# Patient Record
Sex: Male | Born: 1955 | Race: Black or African American | Hispanic: No | Marital: Married | Smoking: Current some day smoker
Health system: Southern US, Community
[De-identification: ages and names within clinical notes are randomized; demographics above are authoritative.]

## PROBLEM LIST (undated history)

## (undated) DIAGNOSIS — IMO0002 Reserved for concepts with insufficient information to code with codable children: Secondary | ICD-10-CM

## (undated) DIAGNOSIS — N189 Chronic kidney disease, unspecified: Secondary | ICD-10-CM

## (undated) DIAGNOSIS — M79673 Pain in unspecified foot: Secondary | ICD-10-CM

## (undated) DIAGNOSIS — I639 Cerebral infarction, unspecified: Secondary | ICD-10-CM

## (undated) DIAGNOSIS — M1990 Unspecified osteoarthritis, unspecified site: Secondary | ICD-10-CM

## (undated) DIAGNOSIS — I739 Peripheral vascular disease, unspecified: Secondary | ICD-10-CM

## (undated) DIAGNOSIS — I10 Essential (primary) hypertension: Secondary | ICD-10-CM

## (undated) DIAGNOSIS — J449 Chronic obstructive pulmonary disease, unspecified: Secondary | ICD-10-CM

## (undated) DIAGNOSIS — G709 Myoneural disorder, unspecified: Secondary | ICD-10-CM

## (undated) DIAGNOSIS — G8929 Other chronic pain: Secondary | ICD-10-CM

## (undated) DIAGNOSIS — E785 Hyperlipidemia, unspecified: Secondary | ICD-10-CM

## (undated) DIAGNOSIS — S92909A Unspecified fracture of unspecified foot, initial encounter for closed fracture: Secondary | ICD-10-CM

## (undated) DIAGNOSIS — M549 Dorsalgia, unspecified: Secondary | ICD-10-CM

## (undated) DIAGNOSIS — J45909 Unspecified asthma, uncomplicated: Secondary | ICD-10-CM

## (undated) HISTORY — DX: Chronic kidney disease, unspecified: N18.9

## (undated) HISTORY — PX: CAROTID ENDARTERECTOMY: SUR193

## (undated) HISTORY — PX: ENUCLEATION: SHX628

## (undated) HISTORY — PX: EYE SURGERY: SHX253

## (undated) HISTORY — DX: Chronic obstructive pulmonary disease, unspecified: J44.9

## (undated) HISTORY — DX: Other chronic pain: G89.29

## (undated) HISTORY — PX: CARDIAC CATHETERIZATION: SHX172

## (undated) HISTORY — DX: Hyperlipidemia, unspecified: E78.5

## (undated) HISTORY — PX: BACK SURGERY: SHX140

## (undated) HISTORY — DX: Pain in unspecified foot: M79.673

## (undated) HISTORY — DX: Cerebral infarction, unspecified: I63.9

## (undated) HISTORY — PX: BELOW KNEE LEG AMPUTATION: SUR23

## (undated) HISTORY — PX: NECK SURGERY: SHX720

## (undated) HISTORY — DX: Essential (primary) hypertension: I10

## (undated) HISTORY — DX: Dorsalgia, unspecified: M54.9

---

## 1983-06-10 DIAGNOSIS — IMO0002 Reserved for concepts with insufficient information to code with codable children: Secondary | ICD-10-CM

## 1983-06-10 HISTORY — DX: Reserved for concepts with insufficient information to code with codable children: IMO0002

## 1999-02-27 ENCOUNTER — Encounter: Admission: RE | Admit: 1999-02-27 | Discharge: 1999-05-28 | Payer: Self-pay | Admitting: Family Medicine

## 2000-02-11 ENCOUNTER — Encounter: Admission: RE | Admit: 2000-02-11 | Discharge: 2000-02-11 | Payer: Self-pay | Admitting: Family Medicine

## 2000-03-30 ENCOUNTER — Encounter: Admission: RE | Admit: 2000-03-30 | Discharge: 2000-03-30 | Payer: Self-pay | Admitting: Family Medicine

## 2000-04-17 ENCOUNTER — Encounter: Admission: RE | Admit: 2000-04-17 | Discharge: 2000-04-17 | Payer: Self-pay | Admitting: Family Medicine

## 2000-08-05 ENCOUNTER — Encounter: Admission: RE | Admit: 2000-08-05 | Discharge: 2000-08-05 | Payer: Self-pay | Admitting: Family Medicine

## 2000-11-30 ENCOUNTER — Encounter: Admission: RE | Admit: 2000-11-30 | Discharge: 2000-11-30 | Payer: Self-pay | Admitting: Family Medicine

## 2001-08-27 ENCOUNTER — Encounter: Admission: RE | Admit: 2001-08-27 | Discharge: 2001-08-27 | Payer: Self-pay | Admitting: Family Medicine

## 2001-09-03 ENCOUNTER — Encounter: Admission: RE | Admit: 2001-09-03 | Discharge: 2001-09-03 | Payer: Self-pay | Admitting: Family Medicine

## 2002-04-06 ENCOUNTER — Encounter: Admission: RE | Admit: 2002-04-06 | Discharge: 2002-04-06 | Payer: Self-pay | Admitting: Family Medicine

## 2002-11-08 ENCOUNTER — Emergency Department (HOSPITAL_COMMUNITY): Admission: EM | Admit: 2002-11-08 | Discharge: 2002-11-08 | Payer: Self-pay | Admitting: Emergency Medicine

## 2002-11-10 ENCOUNTER — Encounter: Admission: RE | Admit: 2002-11-10 | Discharge: 2002-11-10 | Payer: Self-pay | Admitting: Sports Medicine

## 2002-11-11 ENCOUNTER — Encounter: Admission: RE | Admit: 2002-11-11 | Discharge: 2002-11-11 | Payer: Self-pay | Admitting: Sports Medicine

## 2002-11-14 ENCOUNTER — Encounter: Admission: RE | Admit: 2002-11-14 | Discharge: 2002-11-14 | Payer: Self-pay | Admitting: Sports Medicine

## 2002-11-17 ENCOUNTER — Encounter: Admission: RE | Admit: 2002-11-17 | Discharge: 2002-11-17 | Payer: Self-pay | Admitting: Family Medicine

## 2002-11-21 ENCOUNTER — Encounter: Admission: RE | Admit: 2002-11-21 | Discharge: 2002-11-21 | Payer: Self-pay | Admitting: Family Medicine

## 2002-12-09 ENCOUNTER — Encounter: Admission: RE | Admit: 2002-12-09 | Discharge: 2002-12-09 | Payer: Self-pay | Admitting: Family Medicine

## 2003-06-26 ENCOUNTER — Encounter: Admission: RE | Admit: 2003-06-26 | Discharge: 2003-06-26 | Payer: Self-pay | Admitting: Family Medicine

## 2004-03-25 ENCOUNTER — Ambulatory Visit: Payer: Self-pay | Admitting: Family Medicine

## 2004-04-17 ENCOUNTER — Ambulatory Visit: Payer: Self-pay | Admitting: Family Medicine

## 2004-04-23 ENCOUNTER — Ambulatory Visit: Payer: Self-pay | Admitting: Family Medicine

## 2004-05-19 ENCOUNTER — Emergency Department (HOSPITAL_COMMUNITY): Admission: EM | Admit: 2004-05-19 | Discharge: 2004-05-19 | Payer: Self-pay | Admitting: Emergency Medicine

## 2004-05-20 ENCOUNTER — Ambulatory Visit: Payer: Self-pay | Admitting: Sports Medicine

## 2004-05-22 ENCOUNTER — Encounter: Admission: RE | Admit: 2004-05-22 | Discharge: 2004-08-20 | Payer: Self-pay | Admitting: Family Medicine

## 2004-05-27 ENCOUNTER — Ambulatory Visit: Payer: Self-pay | Admitting: Sports Medicine

## 2004-06-17 ENCOUNTER — Ambulatory Visit: Payer: Self-pay | Admitting: Family Medicine

## 2004-07-03 ENCOUNTER — Ambulatory Visit: Payer: Self-pay | Admitting: Family Medicine

## 2004-07-03 ENCOUNTER — Encounter: Admission: RE | Admit: 2004-07-03 | Discharge: 2004-07-03 | Payer: Self-pay | Admitting: Sports Medicine

## 2004-07-24 ENCOUNTER — Ambulatory Visit: Payer: Self-pay | Admitting: Family Medicine

## 2004-07-25 ENCOUNTER — Ambulatory Visit (HOSPITAL_COMMUNITY): Admission: RE | Admit: 2004-07-25 | Discharge: 2004-07-25 | Payer: Self-pay | Admitting: Neurology

## 2004-07-29 ENCOUNTER — Ambulatory Visit: Payer: Self-pay | Admitting: Family Medicine

## 2004-08-09 ENCOUNTER — Ambulatory Visit (HOSPITAL_COMMUNITY): Admission: RE | Admit: 2004-08-09 | Discharge: 2004-08-09 | Payer: Self-pay | Admitting: Neurology

## 2004-08-23 ENCOUNTER — Ambulatory Visit (HOSPITAL_COMMUNITY): Admission: RE | Admit: 2004-08-23 | Discharge: 2004-08-24 | Payer: Self-pay | Admitting: Neurosurgery

## 2004-08-29 ENCOUNTER — Ambulatory Visit: Payer: Self-pay | Admitting: Family Medicine

## 2004-09-17 ENCOUNTER — Ambulatory Visit (HOSPITAL_COMMUNITY): Admission: RE | Admit: 2004-09-17 | Discharge: 2004-09-17 | Payer: Self-pay | Admitting: Neurosurgery

## 2004-10-28 ENCOUNTER — Ambulatory Visit (HOSPITAL_COMMUNITY): Admission: RE | Admit: 2004-10-28 | Discharge: 2004-10-28 | Payer: Self-pay | Admitting: Neurosurgery

## 2004-10-28 ENCOUNTER — Ambulatory Visit: Payer: Self-pay | Admitting: Family Medicine

## 2004-11-11 ENCOUNTER — Ambulatory Visit: Payer: Self-pay | Admitting: Family Medicine

## 2004-11-14 ENCOUNTER — Ambulatory Visit (HOSPITAL_COMMUNITY): Admission: RE | Admit: 2004-11-14 | Discharge: 2004-11-14 | Payer: Self-pay | Admitting: Neurosurgery

## 2004-11-26 ENCOUNTER — Encounter: Admission: RE | Admit: 2004-11-26 | Discharge: 2005-02-24 | Payer: Self-pay | Admitting: Neurology

## 2004-12-16 ENCOUNTER — Emergency Department (HOSPITAL_COMMUNITY): Admission: EM | Admit: 2004-12-16 | Discharge: 2004-12-16 | Payer: Self-pay | Admitting: Family Medicine

## 2004-12-16 ENCOUNTER — Ambulatory Visit (HOSPITAL_COMMUNITY): Admission: RE | Admit: 2004-12-16 | Discharge: 2004-12-16 | Payer: Self-pay | Admitting: Family Medicine

## 2005-01-03 ENCOUNTER — Ambulatory Visit: Payer: Self-pay | Admitting: Family Medicine

## 2005-01-22 ENCOUNTER — Ambulatory Visit: Payer: Self-pay | Admitting: Family Medicine

## 2005-02-14 ENCOUNTER — Ambulatory Visit (HOSPITAL_COMMUNITY): Admission: RE | Admit: 2005-02-14 | Discharge: 2005-02-14 | Payer: Self-pay | Admitting: Neurosurgery

## 2005-02-18 ENCOUNTER — Ambulatory Visit: Payer: Self-pay | Admitting: Family Medicine

## 2005-03-04 ENCOUNTER — Ambulatory Visit (HOSPITAL_COMMUNITY): Admission: RE | Admit: 2005-03-04 | Discharge: 2005-03-04 | Payer: Self-pay | Admitting: Neurology

## 2005-04-21 ENCOUNTER — Ambulatory Visit: Payer: Self-pay | Admitting: Family Medicine

## 2005-04-25 ENCOUNTER — Ambulatory Visit (HOSPITAL_COMMUNITY): Admission: RE | Admit: 2005-04-25 | Discharge: 2005-04-25 | Payer: Self-pay | Admitting: Cardiovascular Disease

## 2005-05-16 ENCOUNTER — Ambulatory Visit (HOSPITAL_COMMUNITY): Admission: RE | Admit: 2005-05-16 | Discharge: 2005-05-16 | Payer: Self-pay | Admitting: Cardiovascular Disease

## 2005-06-16 ENCOUNTER — Ambulatory Visit: Payer: Self-pay | Admitting: Sports Medicine

## 2005-06-28 ENCOUNTER — Ambulatory Visit (HOSPITAL_COMMUNITY): Admission: RE | Admit: 2005-06-28 | Discharge: 2005-06-28 | Payer: Self-pay | Admitting: Neurology

## 2005-07-22 ENCOUNTER — Encounter: Admission: RE | Admit: 2005-07-22 | Discharge: 2005-10-20 | Payer: Self-pay | Admitting: Neurology

## 2005-07-30 ENCOUNTER — Ambulatory Visit: Payer: Self-pay | Admitting: Sports Medicine

## 2005-10-01 ENCOUNTER — Ambulatory Visit (HOSPITAL_COMMUNITY): Admission: RE | Admit: 2005-10-01 | Discharge: 2005-10-01 | Payer: Self-pay | Admitting: Neurology

## 2005-10-16 ENCOUNTER — Inpatient Hospital Stay (HOSPITAL_COMMUNITY): Admission: EM | Admit: 2005-10-16 | Discharge: 2005-10-22 | Payer: Self-pay | Admitting: Emergency Medicine

## 2005-10-16 ENCOUNTER — Ambulatory Visit: Payer: Self-pay | Admitting: Cardiovascular Disease

## 2005-10-16 ENCOUNTER — Ambulatory Visit: Payer: Self-pay | Admitting: Family Medicine

## 2005-10-17 ENCOUNTER — Encounter: Payer: Self-pay | Admitting: Cardiovascular Disease

## 2005-10-27 ENCOUNTER — Ambulatory Visit: Payer: Self-pay | Admitting: Family Medicine

## 2005-11-05 ENCOUNTER — Encounter
Admission: RE | Admit: 2005-11-05 | Discharge: 2006-02-03 | Payer: Self-pay | Admitting: Physical Medicine & Rehabilitation

## 2005-11-05 ENCOUNTER — Ambulatory Visit: Payer: Self-pay | Admitting: Physical Medicine & Rehabilitation

## 2005-11-10 ENCOUNTER — Ambulatory Visit: Payer: Self-pay | Admitting: Family Medicine

## 2005-11-19 ENCOUNTER — Ambulatory Visit: Payer: Self-pay | Admitting: Family Medicine

## 2005-11-27 ENCOUNTER — Encounter
Admission: RE | Admit: 2005-11-27 | Discharge: 2005-12-17 | Payer: Self-pay | Admitting: Physical Medicine & Rehabilitation

## 2005-12-04 ENCOUNTER — Ambulatory Visit: Payer: Self-pay | Admitting: Family Medicine

## 2005-12-08 ENCOUNTER — Ambulatory Visit (HOSPITAL_COMMUNITY): Admission: RE | Admit: 2005-12-08 | Discharge: 2005-12-08 | Payer: Self-pay | Admitting: Sports Medicine

## 2005-12-22 ENCOUNTER — Ambulatory Visit: Payer: Self-pay | Admitting: Physical Medicine & Rehabilitation

## 2005-12-26 ENCOUNTER — Ambulatory Visit: Payer: Self-pay | Admitting: Family Medicine

## 2005-12-29 ENCOUNTER — Ambulatory Visit: Payer: Self-pay | Admitting: Family Medicine

## 2006-01-05 ENCOUNTER — Encounter
Admission: RE | Admit: 2006-01-05 | Discharge: 2006-02-13 | Payer: Self-pay | Admitting: Physical Medicine & Rehabilitation

## 2006-01-29 ENCOUNTER — Ambulatory Visit: Payer: Self-pay | Admitting: Family Medicine

## 2006-02-02 ENCOUNTER — Encounter
Admission: RE | Admit: 2006-02-02 | Discharge: 2006-05-03 | Payer: Self-pay | Admitting: Physical Medicine & Rehabilitation

## 2006-02-02 ENCOUNTER — Ambulatory Visit: Payer: Self-pay | Admitting: Physical Medicine & Rehabilitation

## 2006-03-06 ENCOUNTER — Ambulatory Visit: Payer: Self-pay | Admitting: Family Medicine

## 2006-03-06 ENCOUNTER — Emergency Department (HOSPITAL_COMMUNITY): Admission: EM | Admit: 2006-03-06 | Discharge: 2006-03-07 | Payer: Self-pay | Admitting: Emergency Medicine

## 2006-03-06 ENCOUNTER — Encounter: Admission: RE | Admit: 2006-03-06 | Discharge: 2006-03-06 | Payer: Self-pay | Admitting: Sports Medicine

## 2006-03-09 ENCOUNTER — Ambulatory Visit: Payer: Self-pay | Admitting: Family Medicine

## 2006-04-13 ENCOUNTER — Ambulatory Visit: Payer: Self-pay | Admitting: Family Medicine

## 2006-04-24 ENCOUNTER — Ambulatory Visit: Payer: Self-pay | Admitting: Family Medicine

## 2006-04-27 ENCOUNTER — Ambulatory Visit: Payer: Self-pay | Admitting: Physical Medicine & Rehabilitation

## 2006-05-11 ENCOUNTER — Ambulatory Visit: Payer: Self-pay | Admitting: Sports Medicine

## 2006-05-22 ENCOUNTER — Encounter
Admission: RE | Admit: 2006-05-22 | Discharge: 2006-08-20 | Payer: Self-pay | Admitting: Physical Medicine & Rehabilitation

## 2006-06-19 ENCOUNTER — Encounter
Admission: RE | Admit: 2006-06-19 | Discharge: 2006-09-17 | Payer: Self-pay | Admitting: Physical Medicine & Rehabilitation

## 2006-06-22 ENCOUNTER — Ambulatory Visit: Payer: Self-pay | Admitting: Physical Medicine & Rehabilitation

## 2006-07-21 ENCOUNTER — Encounter
Admission: RE | Admit: 2006-07-21 | Discharge: 2006-10-19 | Payer: Self-pay | Admitting: Physical Medicine and Rehabilitation

## 2006-07-21 ENCOUNTER — Ambulatory Visit: Payer: Self-pay | Admitting: Physical Medicine and Rehabilitation

## 2006-08-06 DIAGNOSIS — E785 Hyperlipidemia, unspecified: Secondary | ICD-10-CM

## 2006-08-06 DIAGNOSIS — E1129 Type 2 diabetes mellitus with other diabetic kidney complication: Secondary | ICD-10-CM

## 2006-08-06 DIAGNOSIS — R269 Unspecified abnormalities of gait and mobility: Secondary | ICD-10-CM

## 2006-08-06 DIAGNOSIS — I10 Essential (primary) hypertension: Secondary | ICD-10-CM | POA: Insufficient documentation

## 2006-08-06 DIAGNOSIS — F17200 Nicotine dependence, unspecified, uncomplicated: Secondary | ICD-10-CM

## 2006-08-06 DIAGNOSIS — Z8679 Personal history of other diseases of the circulatory system: Secondary | ICD-10-CM | POA: Insufficient documentation

## 2006-08-06 DIAGNOSIS — E1149 Type 2 diabetes mellitus with other diabetic neurological complication: Secondary | ICD-10-CM

## 2006-08-06 DIAGNOSIS — N529 Male erectile dysfunction, unspecified: Secondary | ICD-10-CM | POA: Insufficient documentation

## 2006-08-06 DIAGNOSIS — M1710 Unilateral primary osteoarthritis, unspecified knee: Secondary | ICD-10-CM

## 2006-08-18 ENCOUNTER — Ambulatory Visit: Payer: Self-pay | Admitting: Physical Medicine & Rehabilitation

## 2006-11-09 ENCOUNTER — Encounter
Admission: RE | Admit: 2006-11-09 | Discharge: 2007-02-07 | Payer: Self-pay | Admitting: Physical Medicine & Rehabilitation

## 2006-11-10 ENCOUNTER — Ambulatory Visit: Payer: Self-pay | Admitting: Physical Medicine & Rehabilitation

## 2006-11-18 ENCOUNTER — Encounter (INDEPENDENT_AMBULATORY_CARE_PROVIDER_SITE_OTHER): Payer: Self-pay | Admitting: Family Medicine

## 2006-11-18 ENCOUNTER — Ambulatory Visit: Payer: Self-pay | Admitting: Family Medicine

## 2006-11-18 LAB — CONVERTED CEMR LAB
Cholesterol, target level: 200 mg/dL
LDL Goal: 70 mg/dL

## 2006-11-26 LAB — CONVERTED CEMR LAB
BUN: 28 mg/dL — ABNORMAL HIGH (ref 6–23)
Chloride: 111 meq/L (ref 96–112)
Direct LDL: 163 mg/dL — ABNORMAL HIGH
Glucose, Bld: 136 mg/dL — ABNORMAL HIGH (ref 70–99)
Sodium: 143 meq/L (ref 135–145)
Total Bilirubin: 0.3 mg/dL (ref 0.3–1.2)
Total Protein: 6.1 g/dL (ref 6.0–8.3)

## 2006-12-03 ENCOUNTER — Encounter (INDEPENDENT_AMBULATORY_CARE_PROVIDER_SITE_OTHER): Payer: Self-pay | Admitting: Family Medicine

## 2007-02-02 ENCOUNTER — Ambulatory Visit: Payer: Self-pay | Admitting: Physical Medicine & Rehabilitation

## 2007-03-22 ENCOUNTER — Ambulatory Visit: Payer: Self-pay | Admitting: Family Medicine

## 2007-03-22 ENCOUNTER — Telehealth (INDEPENDENT_AMBULATORY_CARE_PROVIDER_SITE_OTHER): Payer: Self-pay | Admitting: Family Medicine

## 2007-03-22 ENCOUNTER — Telehealth (INDEPENDENT_AMBULATORY_CARE_PROVIDER_SITE_OTHER): Payer: Self-pay | Admitting: *Deleted

## 2007-03-29 ENCOUNTER — Encounter
Admission: RE | Admit: 2007-03-29 | Discharge: 2007-06-27 | Payer: Self-pay | Admitting: Physical Medicine & Rehabilitation

## 2007-03-29 ENCOUNTER — Ambulatory Visit: Payer: Self-pay | Admitting: Physical Medicine & Rehabilitation

## 2007-04-23 ENCOUNTER — Encounter (INDEPENDENT_AMBULATORY_CARE_PROVIDER_SITE_OTHER): Payer: Self-pay | Admitting: Family Medicine

## 2007-04-23 ENCOUNTER — Ambulatory Visit: Payer: Self-pay | Admitting: Family Medicine

## 2007-04-23 LAB — CONVERTED CEMR LAB
Albumin: 3.2 g/dL — ABNORMAL LOW (ref 3.5–5.2)
BUN: 27 mg/dL — ABNORMAL HIGH (ref 6–23)
CO2: 21 meq/L (ref 19–32)
Calcium: 8.6 mg/dL (ref 8.4–10.5)
Chloride: 111 meq/L (ref 96–112)
Cholesterol: 215 mg/dL — ABNORMAL HIGH (ref 0–200)
Glucose, Bld: 285 mg/dL — ABNORMAL HIGH (ref 70–99)
Hgb A1c MFr Bld: 8.4 %
LDL Cholesterol: 146 mg/dL — ABNORMAL HIGH (ref 0–99)
Potassium: 5.3 meq/L (ref 3.5–5.3)
Total Bilirubin: 0.3 mg/dL (ref 0.3–1.2)

## 2007-06-16 ENCOUNTER — Encounter
Admission: RE | Admit: 2007-06-16 | Discharge: 2007-09-14 | Payer: Self-pay | Admitting: Physical Medicine & Rehabilitation

## 2007-06-16 ENCOUNTER — Ambulatory Visit: Payer: Self-pay | Admitting: Physical Medicine & Rehabilitation

## 2007-07-09 ENCOUNTER — Ambulatory Visit: Payer: Self-pay | Admitting: Family Medicine

## 2007-07-09 DIAGNOSIS — E663 Overweight: Secondary | ICD-10-CM | POA: Insufficient documentation

## 2007-07-21 ENCOUNTER — Ambulatory Visit: Payer: Self-pay | Admitting: Family Medicine

## 2007-08-10 ENCOUNTER — Ambulatory Visit: Payer: Self-pay | Admitting: Physical Medicine & Rehabilitation

## 2007-08-25 ENCOUNTER — Encounter
Admission: RE | Admit: 2007-08-25 | Discharge: 2007-11-23 | Payer: Self-pay | Admitting: Physical Medicine & Rehabilitation

## 2007-08-25 ENCOUNTER — Ambulatory Visit: Payer: Self-pay | Admitting: Family Medicine

## 2007-09-07 ENCOUNTER — Ambulatory Visit: Payer: Self-pay | Admitting: Physical Medicine & Rehabilitation

## 2007-09-14 ENCOUNTER — Ambulatory Visit: Payer: Self-pay | Admitting: Family Medicine

## 2007-09-14 ENCOUNTER — Telehealth: Payer: Self-pay | Admitting: *Deleted

## 2007-09-14 ENCOUNTER — Ambulatory Visit (HOSPITAL_COMMUNITY): Admission: RE | Admit: 2007-09-14 | Discharge: 2007-09-14 | Payer: Self-pay | Admitting: Family Medicine

## 2007-09-23 ENCOUNTER — Ambulatory Visit: Payer: Self-pay | Admitting: Physical Medicine & Rehabilitation

## 2007-09-26 ENCOUNTER — Ambulatory Visit (HOSPITAL_COMMUNITY)
Admission: RE | Admit: 2007-09-26 | Discharge: 2007-09-26 | Payer: Self-pay | Admitting: Physical Medicine & Rehabilitation

## 2007-10-01 ENCOUNTER — Ambulatory Visit: Payer: Self-pay | Admitting: Family Medicine

## 2007-10-01 ENCOUNTER — Encounter (INDEPENDENT_AMBULATORY_CARE_PROVIDER_SITE_OTHER): Payer: Self-pay | Admitting: Family Medicine

## 2007-10-01 DIAGNOSIS — I739 Peripheral vascular disease, unspecified: Secondary | ICD-10-CM

## 2007-10-02 ENCOUNTER — Telehealth (INDEPENDENT_AMBULATORY_CARE_PROVIDER_SITE_OTHER): Payer: Self-pay | Admitting: Family Medicine

## 2007-10-02 DIAGNOSIS — E875 Hyperkalemia: Secondary | ICD-10-CM

## 2007-10-02 LAB — CONVERTED CEMR LAB
BUN: 41 mg/dL — ABNORMAL HIGH (ref 6–23)
Creatinine, Ser: 1.87 mg/dL — ABNORMAL HIGH (ref 0.40–1.50)

## 2007-10-04 ENCOUNTER — Ambulatory Visit (HOSPITAL_COMMUNITY): Admission: RE | Admit: 2007-10-04 | Discharge: 2007-10-04 | Payer: Self-pay | Admitting: Family Medicine

## 2007-10-04 ENCOUNTER — Ambulatory Visit: Payer: Self-pay | Admitting: Family Medicine

## 2007-10-04 ENCOUNTER — Telehealth (INDEPENDENT_AMBULATORY_CARE_PROVIDER_SITE_OTHER): Payer: Self-pay | Admitting: Family Medicine

## 2007-10-04 ENCOUNTER — Encounter (INDEPENDENT_AMBULATORY_CARE_PROVIDER_SITE_OTHER): Payer: Self-pay | Admitting: Family Medicine

## 2007-10-04 LAB — CONVERTED CEMR LAB: Potassium: 7 meq/L (ref 3.5–5.3)

## 2007-10-05 ENCOUNTER — Encounter (INDEPENDENT_AMBULATORY_CARE_PROVIDER_SITE_OTHER): Payer: Self-pay | Admitting: Family Medicine

## 2007-10-05 ENCOUNTER — Ambulatory Visit: Payer: Self-pay | Admitting: Family Medicine

## 2007-10-08 ENCOUNTER — Ambulatory Visit: Payer: Self-pay | Admitting: Family Medicine

## 2007-10-08 ENCOUNTER — Encounter (INDEPENDENT_AMBULATORY_CARE_PROVIDER_SITE_OTHER): Payer: Self-pay | Admitting: Family Medicine

## 2007-10-08 ENCOUNTER — Ambulatory Visit: Payer: Self-pay | Admitting: Physical Medicine & Rehabilitation

## 2007-10-08 LAB — CONVERTED CEMR LAB: Potassium: 5.6 meq/L — ABNORMAL HIGH (ref 3.5–5.3)

## 2007-10-11 ENCOUNTER — Encounter (INDEPENDENT_AMBULATORY_CARE_PROVIDER_SITE_OTHER): Payer: Self-pay | Admitting: Family Medicine

## 2007-10-28 ENCOUNTER — Encounter (INDEPENDENT_AMBULATORY_CARE_PROVIDER_SITE_OTHER): Payer: Self-pay | Admitting: *Deleted

## 2007-11-04 ENCOUNTER — Ambulatory Visit: Payer: Self-pay | Admitting: Physical Medicine & Rehabilitation

## 2007-11-30 ENCOUNTER — Encounter
Admission: RE | Admit: 2007-11-30 | Discharge: 2008-02-28 | Payer: Self-pay | Admitting: Physical Medicine & Rehabilitation

## 2007-12-02 ENCOUNTER — Ambulatory Visit: Payer: Self-pay | Admitting: Physical Medicine & Rehabilitation

## 2007-12-16 ENCOUNTER — Telehealth (INDEPENDENT_AMBULATORY_CARE_PROVIDER_SITE_OTHER): Payer: Self-pay | Admitting: *Deleted

## 2007-12-30 ENCOUNTER — Ambulatory Visit: Payer: Self-pay | Admitting: Physical Medicine & Rehabilitation

## 2008-01-11 ENCOUNTER — Encounter (INDEPENDENT_AMBULATORY_CARE_PROVIDER_SITE_OTHER): Payer: Self-pay | Admitting: Family Medicine

## 2008-01-14 ENCOUNTER — Encounter (INDEPENDENT_AMBULATORY_CARE_PROVIDER_SITE_OTHER): Payer: Self-pay | Admitting: Family Medicine

## 2008-01-14 ENCOUNTER — Ambulatory Visit: Payer: Self-pay | Admitting: Family Medicine

## 2008-01-17 ENCOUNTER — Ambulatory Visit: Payer: Self-pay | Admitting: Family Medicine

## 2008-01-17 ENCOUNTER — Encounter (INDEPENDENT_AMBULATORY_CARE_PROVIDER_SITE_OTHER): Payer: Self-pay | Admitting: Family Medicine

## 2008-01-17 LAB — CONVERTED CEMR LAB
AST: 12 units/L (ref 0–37)
Albumin: 3.6 g/dL (ref 3.5–5.2)
BUN: 34 mg/dL — ABNORMAL HIGH (ref 6–23)
Calcium: 8.6 mg/dL (ref 8.4–10.5)
Cholesterol: 137 mg/dL (ref 0–200)
Creatinine, Ser: 1.97 mg/dL — ABNORMAL HIGH (ref 0.40–1.50)
Glucose, Bld: 167 mg/dL — ABNORMAL HIGH (ref 70–99)
HDL: 31 mg/dL — ABNORMAL LOW (ref >39)
LDL Cholesterol: 85 mg/dL (ref 0–99)
Potassium: 6 meq/L — ABNORMAL HIGH (ref 3.5–5.3)
Total Bilirubin: 0.2 mg/dL — ABNORMAL LOW (ref 0.3–1.2)

## 2008-01-19 LAB — CONVERTED CEMR LAB: Potassium: 6 meq/L — ABNORMAL HIGH (ref 3.5–5.3)

## 2008-01-21 ENCOUNTER — Ambulatory Visit: Payer: Self-pay | Admitting: Family Medicine

## 2008-01-21 ENCOUNTER — Encounter (INDEPENDENT_AMBULATORY_CARE_PROVIDER_SITE_OTHER): Payer: Self-pay | Admitting: Family Medicine

## 2008-01-21 DIAGNOSIS — N186 End stage renal disease: Secondary | ICD-10-CM | POA: Insufficient documentation

## 2008-01-21 DIAGNOSIS — Z992 Dependence on renal dialysis: Secondary | ICD-10-CM

## 2008-01-21 LAB — CONVERTED CEMR LAB
Glucose, Urine, Semiquant: NEGATIVE
Ketones, urine, test strip: NEGATIVE
Protein, U semiquant: >=300
WBC Urine, dipstick: NEGATIVE
pH: 5.5

## 2008-01-28 ENCOUNTER — Ambulatory Visit (HOSPITAL_COMMUNITY): Admission: RE | Admit: 2008-01-28 | Discharge: 2008-01-28 | Payer: Self-pay | Admitting: Family Medicine

## 2008-02-07 ENCOUNTER — Encounter (INDEPENDENT_AMBULATORY_CARE_PROVIDER_SITE_OTHER): Payer: Self-pay | Admitting: Family Medicine

## 2008-02-16 ENCOUNTER — Encounter
Admission: RE | Admit: 2008-02-16 | Discharge: 2008-05-16 | Payer: Self-pay | Admitting: Physical Medicine & Rehabilitation

## 2008-02-17 ENCOUNTER — Ambulatory Visit: Payer: Self-pay | Admitting: Physical Medicine & Rehabilitation

## 2008-02-21 ENCOUNTER — Encounter (INDEPENDENT_AMBULATORY_CARE_PROVIDER_SITE_OTHER): Payer: Self-pay | Admitting: *Deleted

## 2008-03-16 ENCOUNTER — Ambulatory Visit: Payer: Self-pay | Admitting: Physical Medicine & Rehabilitation

## 2008-03-24 ENCOUNTER — Encounter (INDEPENDENT_AMBULATORY_CARE_PROVIDER_SITE_OTHER): Payer: Self-pay | Admitting: Family Medicine

## 2008-03-30 ENCOUNTER — Ambulatory Visit: Payer: Self-pay | Admitting: Physical Medicine & Rehabilitation

## 2008-04-03 ENCOUNTER — Ambulatory Visit (HOSPITAL_COMMUNITY)
Admission: RE | Admit: 2008-04-03 | Discharge: 2008-04-03 | Payer: Self-pay | Admitting: Physical Medicine & Rehabilitation

## 2008-04-04 ENCOUNTER — Telehealth: Payer: Self-pay | Admitting: *Deleted

## 2008-04-06 ENCOUNTER — Telehealth (INDEPENDENT_AMBULATORY_CARE_PROVIDER_SITE_OTHER): Payer: Self-pay | Admitting: *Deleted

## 2008-04-06 ENCOUNTER — Encounter: Payer: Self-pay | Admitting: *Deleted

## 2008-04-06 ENCOUNTER — Encounter (INDEPENDENT_AMBULATORY_CARE_PROVIDER_SITE_OTHER): Payer: Self-pay | Admitting: Family Medicine

## 2008-04-13 ENCOUNTER — Encounter (INDEPENDENT_AMBULATORY_CARE_PROVIDER_SITE_OTHER): Payer: Self-pay | Admitting: Family Medicine

## 2008-04-13 ENCOUNTER — Ambulatory Visit: Payer: Self-pay | Admitting: Family Medicine

## 2008-04-13 LAB — CONVERTED CEMR LAB: Hgb A1c MFr Bld: 6.8 %

## 2008-04-17 ENCOUNTER — Encounter (INDEPENDENT_AMBULATORY_CARE_PROVIDER_SITE_OTHER): Payer: Self-pay | Admitting: Family Medicine

## 2008-04-20 ENCOUNTER — Ambulatory Visit: Payer: Self-pay | Admitting: Physical Medicine & Rehabilitation

## 2008-04-24 ENCOUNTER — Encounter
Admission: RE | Admit: 2008-04-24 | Discharge: 2008-06-08 | Payer: Self-pay | Admitting: Physical Medicine & Rehabilitation

## 2008-04-25 ENCOUNTER — Encounter (INDEPENDENT_AMBULATORY_CARE_PROVIDER_SITE_OTHER): Payer: Self-pay | Admitting: Family Medicine

## 2008-05-23 ENCOUNTER — Encounter
Admission: RE | Admit: 2008-05-23 | Discharge: 2008-08-21 | Payer: Self-pay | Admitting: Physical Medicine & Rehabilitation

## 2008-05-23 ENCOUNTER — Ambulatory Visit: Payer: Self-pay | Admitting: Physical Medicine & Rehabilitation

## 2008-06-12 ENCOUNTER — Encounter (INDEPENDENT_AMBULATORY_CARE_PROVIDER_SITE_OTHER): Payer: Self-pay | Admitting: Family Medicine

## 2008-06-12 ENCOUNTER — Encounter
Admission: RE | Admit: 2008-06-12 | Discharge: 2008-06-28 | Payer: Self-pay | Admitting: Physical Medicine & Rehabilitation

## 2008-06-20 ENCOUNTER — Ambulatory Visit: Payer: Self-pay | Admitting: Physical Medicine & Rehabilitation

## 2008-06-29 ENCOUNTER — Ambulatory Visit: Payer: Self-pay | Admitting: Physical Medicine & Rehabilitation

## 2008-07-05 ENCOUNTER — Encounter (INDEPENDENT_AMBULATORY_CARE_PROVIDER_SITE_OTHER): Payer: Self-pay | Admitting: Family Medicine

## 2008-07-18 ENCOUNTER — Encounter (INDEPENDENT_AMBULATORY_CARE_PROVIDER_SITE_OTHER): Payer: Self-pay | Admitting: Family Medicine

## 2008-07-19 ENCOUNTER — Encounter (INDEPENDENT_AMBULATORY_CARE_PROVIDER_SITE_OTHER): Payer: Self-pay | Admitting: Family Medicine

## 2008-07-27 ENCOUNTER — Ambulatory Visit: Payer: Self-pay | Admitting: Physical Medicine & Rehabilitation

## 2008-08-09 ENCOUNTER — Ambulatory Visit: Payer: Self-pay | Admitting: Vascular Surgery

## 2008-08-22 ENCOUNTER — Encounter
Admission: RE | Admit: 2008-08-22 | Discharge: 2008-11-16 | Payer: Self-pay | Admitting: Physical Medicine & Rehabilitation

## 2008-08-24 ENCOUNTER — Ambulatory Visit: Payer: Self-pay | Admitting: Physical Medicine & Rehabilitation

## 2008-10-05 ENCOUNTER — Ambulatory Visit: Payer: Self-pay | Admitting: Physical Medicine & Rehabilitation

## 2008-10-10 ENCOUNTER — Ambulatory Visit: Payer: Self-pay | Admitting: Vascular Surgery

## 2008-10-10 ENCOUNTER — Encounter: Payer: Self-pay | Admitting: Physical Medicine & Rehabilitation

## 2008-10-10 ENCOUNTER — Ambulatory Visit
Admission: RE | Admit: 2008-10-10 | Discharge: 2008-10-10 | Payer: Self-pay | Admitting: Physical Medicine & Rehabilitation

## 2008-10-23 ENCOUNTER — Ambulatory Visit: Payer: Self-pay | Admitting: Physical Medicine & Rehabilitation

## 2008-11-01 ENCOUNTER — Ambulatory Visit: Payer: Self-pay | Admitting: Vascular Surgery

## 2008-11-13 ENCOUNTER — Encounter (INDEPENDENT_AMBULATORY_CARE_PROVIDER_SITE_OTHER): Payer: Self-pay | Admitting: Family Medicine

## 2008-11-16 ENCOUNTER — Encounter
Admission: RE | Admit: 2008-11-16 | Discharge: 2008-11-22 | Payer: Self-pay | Admitting: Physical Medicine & Rehabilitation

## 2008-11-22 ENCOUNTER — Ambulatory Visit: Payer: Self-pay | Admitting: Physical Medicine & Rehabilitation

## 2008-11-28 ENCOUNTER — Encounter: Payer: Self-pay | Admitting: Family Medicine

## 2008-12-15 ENCOUNTER — Ambulatory Visit: Payer: Self-pay | Admitting: Family Medicine

## 2008-12-15 DIAGNOSIS — M549 Dorsalgia, unspecified: Secondary | ICD-10-CM

## 2008-12-19 ENCOUNTER — Ambulatory Visit: Payer: Self-pay | Admitting: Family Medicine

## 2008-12-21 ENCOUNTER — Encounter: Payer: Self-pay | Admitting: Family Medicine

## 2008-12-25 ENCOUNTER — Telehealth: Payer: Self-pay | Admitting: Family Medicine

## 2008-12-27 ENCOUNTER — Telehealth: Payer: Self-pay | Admitting: Pharmacist

## 2009-01-02 ENCOUNTER — Encounter: Payer: Self-pay | Admitting: Family Medicine

## 2009-01-09 ENCOUNTER — Encounter: Payer: Self-pay | Admitting: Family Medicine

## 2009-01-09 ENCOUNTER — Ambulatory Visit: Payer: Self-pay | Admitting: Family Medicine

## 2009-01-29 ENCOUNTER — Telehealth: Payer: Self-pay | Admitting: Family Medicine

## 2009-02-08 ENCOUNTER — Telehealth: Payer: Self-pay | Admitting: Family Medicine

## 2009-02-13 ENCOUNTER — Telehealth: Payer: Self-pay | Admitting: Family Medicine

## 2009-02-14 ENCOUNTER — Ambulatory Visit: Payer: Self-pay | Admitting: Family Medicine

## 2009-02-20 ENCOUNTER — Encounter: Payer: Self-pay | Admitting: Family Medicine

## 2009-03-07 ENCOUNTER — Ambulatory Visit: Payer: Self-pay | Admitting: Vascular Surgery

## 2009-03-14 ENCOUNTER — Ambulatory Visit: Payer: Self-pay | Admitting: Family Medicine

## 2009-04-23 ENCOUNTER — Telehealth: Payer: Self-pay | Admitting: Family Medicine

## 2009-04-25 ENCOUNTER — Telehealth: Payer: Self-pay | Admitting: Family Medicine

## 2009-05-07 ENCOUNTER — Ambulatory Visit: Payer: Self-pay | Admitting: Family Medicine

## 2009-05-08 ENCOUNTER — Telehealth: Payer: Self-pay | Admitting: *Deleted

## 2009-05-15 ENCOUNTER — Encounter: Payer: Self-pay | Admitting: Family Medicine

## 2009-05-21 ENCOUNTER — Ambulatory Visit: Payer: Self-pay | Admitting: Family Medicine

## 2009-05-21 LAB — CONVERTED CEMR LAB: Hgb A1c MFr Bld: 7 %

## 2009-06-26 ENCOUNTER — Encounter: Payer: Self-pay | Admitting: Family Medicine

## 2009-07-12 ENCOUNTER — Ambulatory Visit: Payer: Self-pay | Admitting: Family Medicine

## 2009-08-09 ENCOUNTER — Ambulatory Visit: Payer: Self-pay | Admitting: Family Medicine

## 2009-08-09 LAB — CONVERTED CEMR LAB: Hgb A1c MFr Bld: 7.3 %

## 2009-10-30 ENCOUNTER — Ambulatory Visit: Payer: Self-pay | Admitting: Family Medicine

## 2009-11-13 ENCOUNTER — Encounter: Payer: Self-pay | Admitting: Family Medicine

## 2009-11-13 ENCOUNTER — Ambulatory Visit: Payer: Self-pay | Admitting: Family Medicine

## 2009-12-14 ENCOUNTER — Encounter (INDEPENDENT_AMBULATORY_CARE_PROVIDER_SITE_OTHER): Payer: Self-pay | Admitting: Family Medicine

## 2010-02-12 ENCOUNTER — Telehealth: Payer: Self-pay | Admitting: Family Medicine

## 2010-02-18 ENCOUNTER — Ambulatory Visit: Payer: Self-pay | Admitting: Family Medicine

## 2010-02-20 ENCOUNTER — Encounter: Payer: Self-pay | Admitting: Family Medicine

## 2010-02-20 ENCOUNTER — Ambulatory Visit: Payer: Self-pay | Admitting: Family Medicine

## 2010-02-20 LAB — CONVERTED CEMR LAB
AST: 11 units/L (ref 0–37)
Alkaline Phosphatase: 144 units/L — ABNORMAL HIGH (ref 39–117)
BUN: 45 mg/dL — ABNORMAL HIGH (ref 6–23)
Creatinine, Ser: 2.37 mg/dL — ABNORMAL HIGH (ref 0.40–1.50)
Glucose, Bld: 231 mg/dL — ABNORMAL HIGH (ref 70–99)
HDL: 27 mg/dL — ABNORMAL LOW (ref >39)
Hemoglobin: 13 g/dL (ref 13.0–17.0)
LDL Cholesterol: 74 mg/dL (ref 0–99)
MCHC: 31.9 g/dL (ref 30.0–36.0)
MCV: 91.9 fL (ref 78.0–100.0)
Potassium: 5.3 meq/L (ref 3.5–5.3)
RBC: 4.43 M/uL (ref 4.22–5.81)
RDW: 14.4 % (ref 11.5–15.5)
Total Bilirubin: 0.3 mg/dL (ref 0.3–1.2)
Total CHOL/HDL Ratio: 4.6
Triglycerides: 122 mg/dL (ref <150)
VLDL: 24 mg/dL (ref 0–40)

## 2010-02-21 ENCOUNTER — Encounter: Payer: Self-pay | Admitting: Family Medicine

## 2010-02-23 ENCOUNTER — Encounter: Payer: Self-pay | Admitting: Family Medicine

## 2010-03-07 ENCOUNTER — Telehealth: Payer: Self-pay | Admitting: Family Medicine

## 2010-03-31 ENCOUNTER — Encounter: Payer: Self-pay | Admitting: Family Medicine

## 2010-05-16 ENCOUNTER — Telehealth (INDEPENDENT_AMBULATORY_CARE_PROVIDER_SITE_OTHER): Payer: Self-pay | Admitting: *Deleted

## 2010-06-17 ENCOUNTER — Encounter: Payer: Self-pay | Admitting: Family Medicine

## 2010-06-17 ENCOUNTER — Ambulatory Visit: Admission: RE | Admit: 2010-06-17 | Discharge: 2010-06-17 | Payer: Self-pay | Source: Home / Self Care

## 2010-06-17 LAB — CONVERTED CEMR LAB
ALT: 11 U/L
AST: 12 U/L
Albumin: 4.5 g/dL
Alkaline Phosphatase: 182 U/L — ABNORMAL HIGH
BUN: 76 mg/dL — ABNORMAL HIGH
CO2: 17 meq/L — ABNORMAL LOW
Calcium: 9.1 mg/dL
Chloride: 110 meq/L
Creatinine, Ser: 2.7 mg/dL — ABNORMAL HIGH
Direct LDL: 73 mg/dL
Glucose, Bld: 217 mg/dL — ABNORMAL HIGH
HCT: 43.7 %
Hemoglobin: 13.9 g/dL
Hgb A1c MFr Bld: 7.4 %
MCHC: 31.8 g/dL
MCV: 92.2 fL
Platelets: 185 K/uL
Potassium: 5.6 meq/L — ABNORMAL HIGH
RBC: 4.74 M/uL
RDW: 14.2 %
Sodium: 142 meq/L
Total Bilirubin: 0.3 mg/dL
Total Protein: 6.9 g/dL
WBC: 13.5 10*3/microliter — ABNORMAL HIGH

## 2010-06-18 ENCOUNTER — Telehealth: Payer: Self-pay | Admitting: *Deleted

## 2010-06-21 ENCOUNTER — Telehealth: Payer: Self-pay | Admitting: Family Medicine

## 2010-06-24 ENCOUNTER — Ambulatory Visit: Admission: RE | Admit: 2010-06-24 | Discharge: 2010-06-24 | Payer: Self-pay | Source: Home / Self Care

## 2010-06-24 ENCOUNTER — Encounter: Payer: Self-pay | Admitting: Family Medicine

## 2010-06-24 LAB — CONVERTED CEMR LAB
ALT: 11 units/L (ref 0–53)
Alkaline Phosphatase: 190 units/L — ABNORMAL HIGH (ref 39–117)
CO2: 18 meq/L — ABNORMAL LOW (ref 19–32)
Sodium: 139 meq/L (ref 135–145)
Total Bilirubin: 0.3 mg/dL (ref 0.3–1.2)
Total Protein: 6.5 g/dL (ref 6.0–8.3)

## 2010-06-25 ENCOUNTER — Ambulatory Visit
Admission: RE | Admit: 2010-06-25 | Discharge: 2010-06-25 | Payer: Self-pay | Source: Home / Self Care | Attending: Family Medicine | Admitting: Family Medicine

## 2010-06-25 ENCOUNTER — Encounter: Payer: Self-pay | Admitting: Family Medicine

## 2010-06-25 ENCOUNTER — Ambulatory Visit (HOSPITAL_COMMUNITY)
Admission: RE | Admit: 2010-06-25 | Discharge: 2010-06-25 | Payer: Self-pay | Source: Home / Self Care | Admitting: Family Medicine

## 2010-06-25 ENCOUNTER — Other Ambulatory Visit: Payer: Self-pay

## 2010-06-25 ENCOUNTER — Telehealth: Payer: Self-pay | Admitting: Family Medicine

## 2010-06-25 ENCOUNTER — Telehealth: Payer: Self-pay | Admitting: *Deleted

## 2010-06-25 LAB — CONVERTED CEMR LAB
BUN: 49 mg/dL — ABNORMAL HIGH (ref 6–23)
Calcium: 9.1 mg/dL (ref 8.4–10.5)
Creatinine, Ser: 2.15 mg/dL — ABNORMAL HIGH (ref 0.40–1.50)
Glucose, Bld: 48 mg/dL — ABNORMAL LOW (ref 70–99)
Sodium: 145 meq/L (ref 135–145)

## 2010-06-27 ENCOUNTER — Encounter: Payer: Self-pay | Admitting: Family Medicine

## 2010-06-27 ENCOUNTER — Telehealth: Payer: Self-pay | Admitting: Family Medicine

## 2010-06-27 ENCOUNTER — Ambulatory Visit: Admission: RE | Admit: 2010-06-27 | Discharge: 2010-06-27 | Payer: Self-pay | Source: Home / Self Care

## 2010-06-28 LAB — CONVERTED CEMR LAB
Calcium: 8.7 mg/dL (ref 8.4–10.5)
Sodium: 144 meq/L (ref 135–145)

## 2010-06-30 ENCOUNTER — Encounter: Payer: Self-pay | Admitting: Neurosurgery

## 2010-06-30 ENCOUNTER — Encounter: Payer: Self-pay | Admitting: Sports Medicine

## 2010-07-05 ENCOUNTER — Encounter: Payer: Self-pay | Admitting: Family Medicine

## 2010-07-05 ENCOUNTER — Ambulatory Visit: Admission: RE | Admit: 2010-07-05 | Discharge: 2010-07-05 | Payer: Self-pay | Source: Home / Self Care

## 2010-07-05 LAB — CONVERTED CEMR LAB
CO2: 21 meq/L (ref 19–32)
Chloride: 114 meq/L — ABNORMAL HIGH (ref 96–112)
Potassium: 5.9 meq/L — ABNORMAL HIGH (ref 3.5–5.3)
Sodium: 142 meq/L (ref 135–145)

## 2010-07-07 LAB — CONVERTED CEMR LAB
Alpha 1, Urine: DETECTED % — AB
Alpha-1-Globulin: 6 % — ABNORMAL HIGH (ref 2.9–4.9)
Alpha-2-Globulin: 16 % — ABNORMAL HIGH (ref 7.1–11.8)
Beta Globulin: 6.4 % (ref 4.7–7.2)
CO2: 20 meq/L (ref 19–32)
Calcium: 9 mg/dL (ref 8.4–10.5)
Chloride: 113 meq/L — ABNORMAL HIGH (ref 96–112)
Creatinine, Ser: 1.89 mg/dL — ABNORMAL HIGH (ref 0.40–1.50)
IgA: 182 mg/dL (ref 68–378)
PSA: 1.38 ng/mL (ref 0.10–4.00)
Phosphorus: 4.6 mg/dL (ref 2.3–4.6)
Potassium: 6 meq/L — ABNORMAL HIGH (ref 3.5–5.3)
Sodium: 143 meq/L (ref 135–145)
Total Protein, Serum Electrophoresis: 6.6 g/dL (ref 6.0–8.3)

## 2010-07-08 ENCOUNTER — Encounter: Payer: Self-pay | Admitting: Family Medicine

## 2010-07-11 NOTE — Assessment & Plan Note (Signed)
Summary: med refills & tick bite/eo   Vital Signs:  Patient profile:   55 year old male Weight:      197 pounds BMI:     27.58 Temp:     98.7 degrees F Pulse rate:   89 / minute BP sitting:   130 / 70  (left arm)  Vitals Entered By: Starleen Blue RN (November 13, 2009 2:37 PM) CC: refills check tic bite, Hypertension Management Is Patient Diabetic? Yes Did you bring your meter with you today? No Pain Assessment Patient in pain? no        Primary Care Provider:  Renold Don MD  CC:  refills check tic bite and Hypertension Management.  History of Present Illness: CC:  follow-up for tick bite  1.  Tick bite:  Pt bit by tick, removed by wife about 2 weeks ago.  Since then, describes some itching at site of bite.  Denies any headaches, changes in vision, neck stiffness, abdominal pian, nausea or vomiting,  or rash since last visit.  Has been washing area with soap and water as well as hydrogen peroxide to keep it clean.  No pain or discharge at the site.    Hypertension History:      Positive major cardiovascular risk factors include male age 94 years old or older, diabetes, hyperlipidemia, hypertension, and current tobacco user.  Negative major cardiovascular risk factors include negative family history for ischemic heart disease.        Positive history for target organ damage include prior stroke (or TIA) and peripheral vascular disease.  Further assessment for target organ damage reveals no history of ASHD, cardiac end-organ damage (CHF/LVH), renal insufficiency, or hypertensive retinopathy.      Habits & Providers  Alcohol-Tobacco-Diet     Tobacco Status: current     Tobacco Counseling: to quit use of tobacco products     Cigarette Packs/Day: 0.5  Current Problems (verified): 1)  Insect Bite  (ICD-919.4) 2)  Shoulder Pain, Right  (ICD-719.41) 3)  Back Pain, Chronic  (ICD-724.5) 4)  Cerebrovascular Accident, Hx of  (ICD-V12.50) 5)  Diabetes Mellitus, II, Complications   (ICD-250.92) 6)  Hypertension, Benign Systemic  (ICD-401.1) 7)  Hyperlipidemia  (ICD-272.4) 8)  Renal Failure, Chronic - Stage Iii  (ICD-585.9) 9)  Hyperkalemia  (ICD-276.7) 10)  Microscopic Hematuria  (ICD-599.72) 11)  Abscess, Tooth  (ICD-522.5) 12)  Claudication, Intermittent  (ICD-443.9) 13)  Neuropathy, Diabetic  (ICD-250.60) 14)  Impotence, Organic  (ICD-607.84) 15)  Gait, Abnormal  (ICD-781.2) 16)  Tobacco Dependence  (ICD-305.1) 17)  Overweight  (ICD-278.02) 18)  Osteoarthritis, Lower Leg  (ICD-715.96)  Current Medications (verified): 1)  Aspirin Ec Lo-Dose 81 Mg Tbec (Aspirin) .... Take 1 Tablet By Mouth Once A Day 2)  Humalog 100 Unit/ml Soln (Insulin Lispro (Human)) .... As Needed 3)  Lantus 100 Unit/ml Soln (Insulin Glargine) .... Inject 100 Unit Subcutaneously Once A Day 4)  Percocet 7.5-325 Mg  Tabs (Oxycodone-Acetaminophen) .... Take 1 Tab By Mouth Every 6 Hours For Pain - Do Not Fill Before April 15 5)  Simvastatin 80 Mg Tabs (Simvastatin) .... Take 1 Tablet By Mouth At Bedtime 6)  Amlodipine Besylate 10 Mg  Tabs (Amlodipine Besylate) .... One Tablet Daily For Blood Pressure Control 7)  Kayexalate   Powd (Sodium Polystyrene Sulfonate) .Marland Kitchen.. 15 G Po Daily. 8)  Furosemide 40 Mg Tabs (Furosemide) .Marland Kitchen.. 1 Tablet By Mouth Two Times A Day - Per Dr. Lacy Duverney 9)  Baclofen 10 Mg Tabs (Baclofen) .Marland KitchenMarland KitchenMarland Kitchen  Take 1 By Mouth Twice Daily. 10)  Azithromycin 500 Mg Tabs (Azithromycin) .... Take 2 Pills The First Day, and Then 1 Pill Daily After That For 4 More Days 11)  Tessalon 200 Mg Caps (Benzonatate) .... Take 1 Cap Every 4-6 Hours For Cough 12)  Gani-Tuss Dm Nr 100-10 Mg/36ml Liqd (Dextromethorphan-Guaifenesin) .... Sig: Take 1 Tsp By Mouth Every 4 Hours As Needed For Cough Disp 1 Bottle  Allergies (verified): No Known Drug Allergies  Past History:  Past medical, surgical, family and social histories (including risk factors) reviewed, and no changes noted (except as noted  below).  Past Medical History: Reviewed history from 04/13/2008 and no changes required. 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-80% med mgt - 11/10/2005 Echo: LV function lower limits of normal - 11/10/2005 MRI of Head sub acute CVA - 11/10/2005 MRI spine: 3-4-5 spinal stenosis,spinal cord edema - 11/10/2005 Botox from pain center for muscle spasm of L groin Fall at work (off of truck), R knee and spine injury 12/05 - s/p surgery 2006, uses cane ever since Lower ext spasm, weakness & gait disturbance - from spinal cord damage:  sees neuro Thad Ranger) and           neurosurg Wynetta Emery) [facility] - referred to PT 11/09 Hospitalized with peptic ulcer 1984   Past Surgical History: Reviewed history from 04/13/2008 and no changes required. cervical myelopathy s/p diskectomy C3-4-5-6 - 11/10/2005 Left eye prosthesis - age 79  Family History: Reviewed history from 04/13/2008 and no changes required. father died at age 59, had DM and CAD (CABG X3 in 34s) mother with DM and CAD (MI age 75) brother - DM brother - died at 59 of MI, DM, CHF  Social History: Reviewed history from 04/13/2008 and no changes required. 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  No alcohol or drugs.  Physical Exam  General:  Vital signs reviewed Well-developed, well-nourished patient in NAD.  Awake, cooperative.  Mouth:  Oral mucosa and oropharynx without lesions or exudates.   Lungs:  Normal respiratory effort, chest expands symmetrically. Lungs are clear to auscultation, no crackles or wheezes. Heart:  Normal rate and regular rhythm. S1 and S2 normal without gallop, murmur, click, rub or other extra sounds. Abdomen:  Bowel sounds positive,abdomen soft and non-tender without masses, organomegaly or hernias noted. Pulses:  decreased pulses BL LEs Extremities:  no edema BL  LE's Neurologic:  alert & oriented X3, cranial nerves II-XII intact, and strength normal in all extremities.  Sensation decreased to shins bilateral lower extremities.  Pt ambulates with cane Skin:  small 1 mm raised non-erythematous papule where pt was bit by tick on posterior of neck.  No signs of rash or target lesion around area Cervical Nodes:  No lymphadenopathy noted   Impression & Recommendations:  Problem # 1:  INSECT BITE (ICD-919.4) Assessment Unchanged  Pt continues to do well from tick bite.  No signs or symptoms of disseminated disease.  Counseled patient what to look for regarding RMSF or Lyme disease, which are the major tick-borne illnesses in this area.  Also counseled he should continue to keep area clean to prevent infection.  If itches, can try OTC anti-histamines for topical relief.  No need to follow-up for this.    Orders: [facility]- Est Level  3 (57846)  Problem # 2:  DIABETES MELLITUS, II, COMPLICATIONS (  ICD-250.92) Assessment: Unchanged Discussed patient's current medication regimen. States he is adhering to regimen with daily blood sugar checks.  Reviewed patient's records, he has been on Lisinopril in past for DM and HTN, however this was discontinued in 2009 due to hyperkalemia.  Currently on HCTZ and Amlodipine.  Plan to continue these as well as ASA, Lantus, and daily humalog.  Last A1C in March was 7.3.  Goal is ideally less than 7, but this shows pretty good adherence to medication regimen.  Will obtain follow-up A1C at next visit.   His updated medication list for this problem includes:    Aspirin Ec Lo-dose 81 Mg Tbec (Aspirin) .Marland Kitchen... Take 1 tablet by mouth once a day    Humalog 100 Unit/ml Soln (Insulin lispro (human)) .Marland Kitchen... As needed    Lantus 100 Unit/ml Soln (Insulin glargine) ..... Inject 100 unit subcutaneously once a day  Orders: [facility]- Est Level  3 (16109)  Problem # 3:  HYPERTENSION, BENIGN SYSTEMIC (ICD-401.1) On Amlodipine and Furosemide for this.   Blood pressure at 130 systolic today, just at goal blood pressure.  Has been on Lisinopril in past, but has hyperkalemia with this secondary to chronic kidney disease.  Plan to continue current regimen.  Systolics last two visits have shown 120s.   His updated medication list for this problem includes:    Amlodipine Besylate 10 Mg Tabs (Amlodipine besylate) ..... One tablet daily for blood pressure control    Furosemide 40 Mg Tabs (Furosemide) .Marland Kitchen... 1 tablet by mouth two times a day - per dr. Lacy Duverney  Orders: [facility]- Est Level  3 (60454)  Problem # 4:  BACK PAIN, CHRONIC (ICD-724.5) Patient still on chronic pain medication for this.  Discussed other ways to control pain, he states he has been through all that before.  Is willing to try water aerobics to help with chronic pain and to increase his ambulation.  Has tried lifting weights in past but has been too sore afterwards.  Encouraged him to continue with physical therapy and to try water aerobics.  Would like to try longer-acting opiate such as MS Contin, patient very wary about changing medications as this is what works for him now.  Will discuss at next visit, with more encouragement to change to longer-acting pain reliever.     His updated medication list for this problem includes:    Aspirin Ec Lo-dose 81 Mg Tbec (Aspirin) .Marland Kitchen... Take 1 tablet by mouth once a day    Percocet 7.5-325 Mg Tabs (Oxycodone-acetaminophen) .Marland Kitchen... Take 1 tab by mouth every 6 hours for pain    Baclofen 10 Mg Tabs (Baclofen) .Marland Kitchen... Take 1 by mouth twice daily.  Complete Medication List: 1)  Aspirin Ec Lo-dose 81 Mg Tbec (Aspirin) .... Take 1 tablet by mouth once a day 2)  Humalog 100 Unit/ml Soln (Insulin lispro (human)) .... As needed 3)  Lantus 100 Unit/ml Soln (Insulin glargine) .... Inject 100 unit subcutaneously once a day 4)  Percocet 7.5-325 Mg Tabs (Oxycodone-acetaminophen) .... Take 1 tab by mouth every 6 hours for pain 5)  Simvastatin 80 Mg Tabs (Simvastatin)  .... Take 1 tablet by mouth at bedtime 6)  Amlodipine Besylate 10 Mg Tabs (Amlodipine besylate) .... One tablet daily for blood pressure control 7)  Kayexalate Powd (Sodium polystyrene sulfonate) .Marland Kitchen.. 15 g po daily. 8)  Furosemide 40 Mg Tabs (Furosemide) .Marland Kitchen.. 1 tablet by mouth two times a day - per dr. Lacy Duverney 9)  Baclofen 10 Mg Tabs (Baclofen) .... Take 1  by mouth twice daily. 10)  Azithromycin 500 Mg Tabs (Azithromycin) .... Take 2 pills the first day, and then 1 pill daily after that for 4 more days 11)  Tessalon 200 Mg Caps (Benzonatate) .... Take 1 cap every 4-6 hours for cough 12)  Gani-tuss Dm Nr 100-10 Mg/58ml Liqd (Dextromethorphan-guaifenesin) .... Sig: take 1 tsp by mouth every 4 hours as needed for cough disp 1 bottle  Hypertension Assessment/Plan:      The patient's hypertensive risk group is category C: Target organ damage and/or diabetes.  His calculated 10 year risk of coronary heart disease is 18 %.  Today's blood pressure is 130/70.  His blood pressure goal is < 130/80.

## 2010-07-11 NOTE — Progress Notes (Signed)
Summary: Handicap placard  Phone Note Call from Patient Call back at Home Phone [phone]   Reason for Call: Talk to Nurse Summary of Call: pt asking for handicap placard, was told we have forms here, please call when ready Initial call taken by: Knox Royalty,  June 25, 2010 12:06 PM  Follow-up for Phone Call        Will complete today when I go by clinic for Resident Meeting.   Follow-up by: Renold Don MD,  July 03, 2010 10:27 AM  Additional Follow-up for Phone Call Additional follow up Details #1::        Completed and placed in "to be called" box.   Additional Follow-up by: Renold Don MD,  July 03, 2010 7:56 PM

## 2010-07-11 NOTE — Miscellaneous (Signed)
Clinical Lists Changes  Made lots of changes to these medications in anticipation of appointment today.  However, needed to change Percocet to July 8 fill date rather than July 15.  Patient only left with 3 prescriptions total, 1 for each of next 3 months.  All incorrect prescriptions were shredded.   Medications: Changed medication from PERCOCET 7.5-325 MG  TABS (OXYCODONE-ACETAMINOPHEN) Take 1 tab by mouth every 6 hours for pain - DO NOT FILL BEFORE APRIL 15 to PERCOCET 7.5-325 MG  TABS (OXYCODONE-ACETAMINOPHEN) Take 1 tab by mouth every 6 hours for pain - DO NOT FILL BEFORE JUNE 15 - Signed Changed medication from PERCOCET 7.5-325 MG  TABS (OXYCODONE-ACETAMINOPHEN) Take 1 tab by mouth every 6 hours for pain - DO NOT FILL BEFORE JUNE 15 to PERCOCET 7.5-325 MG  TABS (OXYCODONE-ACETAMINOPHEN) Take 1 tab by mouth every 6 hours for pain - DO NOT FILL BEFORE JULY 15 - Signed Changed medication from PERCOCET 7.5-325 MG  TABS (OXYCODONE-ACETAMINOPHEN) Take 1 tab by mouth every 6 hours for pain - DO NOT FILL BEFORE JULY 15 to PERCOCET 7.5-325 MG  TABS (OXYCODONE-ACETAMINOPHEN) Take 1 tab by mouth every 6 hours for pain - DO NOT FILL BEFORE JULY 8 - Signed Changed medication from PERCOCET 7.5-325 MG  TABS (OXYCODONE-ACETAMINOPHEN) Take 1 tab by mouth every 6 hours for pain - DO NOT FILL BEFORE JULY 8 to PERCOCET 7.5-325 MG  TABS (OXYCODONE-ACETAMINOPHEN) Take 1 tab by mouth every 6 hours for pain - DO NOT FILL BEFORE August 8 - Signed Changed medication from PERCOCET 7.5-325 MG  TABS (OXYCODONE-ACETAMINOPHEN) Take 1 tab by mouth every 6 hours for pain - DO NOT FILL BEFORE August 8 to PERCOCET 7.5-325 MG  TABS (OXYCODONE-ACETAMINOPHEN) Take 1 tab by mouth every 6 hours for pain - Signed Rx of PERCOCET 7.5-325 MG  TABS (OXYCODONE-ACETAMINOPHEN) Take 1 tab by mouth every 6 hours for pain - DO NOT FILL BEFORE JUNE 15;  #120 x 0;  Signed;  Entered by: Renold Don MD;  Authorized by: Renold Don MD;  Method used:  Print then Give to Patient Rx of PERCOCET 7.5-325 MG  TABS (OXYCODONE-ACETAMINOPHEN) Take 1 tab by mouth every 6 hours for pain - DO NOT FILL BEFORE JULY 15;  #120 x 0;  Signed;  Entered by: Renold Don MD;  Authorized by: Renold Don MD;  Method used: Print then Give to Patient Rx of PERCOCET 7.5-325 MG  TABS (OXYCODONE-ACETAMINOPHEN) Take 1 tab by mouth every 6 hours for pain - DO NOT FILL BEFORE JULY 8;  #120 x 0;  Signed;  Entered by: Renold Don MD;  Authorized by: Renold Don MD;  Method used: Print then Give to Patient Rx of PERCOCET 7.5-325 MG  TABS (OXYCODONE-ACETAMINOPHEN) Take 1 tab by mouth every 6 hours for pain - DO NOT FILL BEFORE JULY 8;  #120 x 0;  Signed;  Entered by: Renold Don MD;  Authorized by: Renold Don MD;  Method used: Print then Give to Patient Rx of PERCOCET 7.5-325 MG  TABS (OXYCODONE-ACETAMINOPHEN) Take 1 tab by mouth every 6 hours for pain - DO NOT FILL BEFORE August 8;  #120 x 0;  Signed;  Entered by: Renold Don MD;  Authorized by: Renold Don MD;  Method used: Print then Give to Patient Rx of PERCOCET 7.5-325 MG  TABS (OXYCODONE-ACETAMINOPHEN) Take 1 tab by mouth every 6 hours for pain;  #120 x 0;  Signed;  Entered by: Renold Don MD;  Authorized by: Renold Don MD;  Method used: Print then Give to Patient    Prescriptions: PERCOCET 7.5-325 MG  TABS (OXYCODONE-ACETAMINOPHEN) Take 1 tab by mouth every 6 hours for pain  #120 x 0   Entered and Authorized by:   Renold Don MD   Signed by:   Renold Don MD on 11/13/2009   Method used:   Print then Give to Patient   RxID:   [id number] PERCOCET 7.5-325 MG  TABS (OXYCODONE-ACETAMINOPHEN) Take 1 tab by mouth every 6 hours for pain - DO NOT FILL BEFORE August 8  #120 x 0   Entered and Authorized by:   Renold Don MD   Signed by:   Renold Don MD on 11/13/2009   Method used:   Print then Give to Patient   RxID:   [id number] PERCOCET 7.5-325 MG  TABS (OXYCODONE-ACETAMINOPHEN) Take 1 tab by mouth every 6 hours  for pain - DO NOT FILL BEFORE JULY 8  #120 x 0   Entered and Authorized by:   Renold Don MD   Signed by:   Renold Don MD on 11/13/2009   Method used:   Print then Give to Patient   RxID:   [id number] PERCOCET 7.5-325 MG  TABS (OXYCODONE-ACETAMINOPHEN) Take 1 tab by mouth every 6 hours for pain - DO NOT FILL BEFORE JULY 8  #120 x 0   Entered and Authorized by:   Renold Don MD   Signed by:   Renold Don MD on 11/13/2009   Method used:   Print then Give to Patient   RxID:   [id number] PERCOCET 7.5-325 MG  TABS (OXYCODONE-ACETAMINOPHEN) Take 1 tab by mouth every 6 hours for pain - DO NOT FILL BEFORE JULY 15  #120 x 0   Entered and Authorized by:   Renold Don MD   Signed by:   Renold Don MD on 11/13/2009   Method used:   Print then Give to Patient   RxID:   [id number] PERCOCET 7.5-325 MG  TABS (OXYCODONE-ACETAMINOPHEN) Take 1 tab by mouth every 6 hours for pain - DO NOT FILL BEFORE JUNE 15  #120 x 0   Entered and Authorized by:   Renold Don MD   Signed by:   Renold Don MD on 11/13/2009   Method used:   Print then Give to Patient   RxID:   [id number]

## 2010-07-11 NOTE — Consult Note (Signed)
Summary: [facility]  [facility]   Imported By: Clydell Hakim 06/27/2009 15:08:40  _____________________________________________________________________  External Attachment:    Type:   Image     Comment:   External Document

## 2010-07-11 NOTE — Assessment & Plan Note (Signed)
Summary: elevated K/EKG/kf   Primary Provider:  Renold Don MD   History of Present Illness: Pt return to clinic today per request of his PCP because of hyperkalemia discovered on his labs yesterday.  His potassium was 5.6 on 1/9 and 6 on 1/16.  Dr. Gwendolyn Grant had asked him to see his nephrologist, which he did yesterday evening.  His nephrologist instructed him to take two pills of sodium bicarb twice daily (presumable for RTA) and decrease his kayexelate to one packet every other day.  He reports no chest pain, palpitations, problems breathing, or other changes to his health condition since his last visit.  Allergies: No Known Drug Allergies  Past History:  Past medical, surgical, family and social histories (including risk factors) reviewed for relevance to current acute and chronic problems.  Past Medical History: Reviewed history from 04/13/2008 and no changes required. 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-80% med mgt - 11/10/2005 Echo: LV function lower limits of normal - 11/10/2005 MRI of Head sub acute CVA - 11/10/2005 MRI spine: 3-4-5 spinal stenosis,spinal cord edema - 11/10/2005 Botox from pain center for muscle spasm of L groin Fall at work (off of truck), R knee and spine injury 12/05 - s/p surgery 2006, uses cane ever since Lower ext spasm, weakness & gait disturbance - from spinal cord damage:  sees neuro Thad Ranger) and           neurosurg Wynetta Emery) + [facility] ([facility]) - referred to PT 11/09 Hospitalized with peptic ulcer 1984   Past Surgical History: Reviewed history from 04/13/2008 and no changes required. 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 father died at age 68, had DM and CAD (CABG X3 in 27s) mother with DM and CAD (MI age 17) brother - DM brother - died at 60 of MI, DM, CHF  Social  History: Reviewed history from 04/13/2008 and no changes required. 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  No alcohol or drugs.  Review of Systems  The patient denies anorexia, fever, weight loss, weight gain, vision loss, decreased hearing, chest pain, syncope, dyspnea on exertion, and peripheral edema.    Physical Exam  General:  Vital signs reviewed. Well-developed, well-nourished patient in NAD.  Awake and cooperative  Lungs:  clear to auscultation bilaterally without wheezing, rales, or rhonchi.  Normal work of breathing  Heart:  Regular rate and rhythm without murmur, rub, or gallop.  Normal S1/S2  Abdomen:  obese, soft, benign exam.  Good bowel sounds without masses   Impression & Recommendations:  Problem # 1:  HYPERKALEMIA (ICD-276.7) Pt's EKG does not show any signs of hyperkalemia.  He is in sinus tach (hr 101) with a slightly abnormal axsis but no acute st segment changes, peaked T waves, or QRS elongation.  His potassium today is 5.6.  Plan would be for him to continue on his medications as instructed by his nephrologist, fax lab results to his nephrologist at [facility], and see him back in two days.  At that time will plan on obtaining another BMET and likely have him follow up with his primary sometime next week.  Orders: Basic Met-[facility] ([address]-60454) 12 Lead EKG (12 Lead EKG) [facility]- Est Level  3 (09811)  Complete Medication List: 1)  Aspirin Ec Lo-dose 81  Mg Tbec (Aspirin) .... Take 1 tablet by mouth once a day 2)  Humalog 100 Unit/ml Soln (Insulin lispro (human)) .... As needed 3)  Lantus 100 Unit/ml Soln (Insulin glargine) .... Inject 100 unit subcutaneously once a day 4)  Oxycodone-acetaminophen 5-325 Mg Tabs (Oxycodone-acetaminophen) .... Take 1 pill twice daily as needed for pain 5)  Simvastatin 80 Mg Tabs (Simvastatin) .... Take 1 tablet by mouth at bedtime 6)   Amlodipine Besylate 10 Mg Tabs (Amlodipine besylate) .... One tablet daily for blood pressure control 7)  Kayexalate Powd (Sodium polystyrene sulfonate) .Marland Kitchen.. 15 g po daily. 8)  Furosemide 40 Mg Tabs (Furosemide) .Marland Kitchen.. 1 tablet by mouth two times a day - per dr. Lacy Duverney 9)  Baclofen 10 Mg Tabs (Baclofen) .... Take 1 by mouth twice daily. 10)  Gani-tuss Dm Nr 100-10 Mg/20ml Liqd (Dextromethorphan-guaifenesin) .... Sig: take 1 tsp by mouth every 4 hours as needed for cough disp 1 bottle 11)  Oxycontin 10 Mg Xr12h-tab (Oxycodone hcl) .... Take 1 tab twice daily for long-acting pain relief  Patient Instructions: 1)  It was great to see you today! 2)  Your potassium has come down to 5.6 today and your EKG was OK.  Both of those are good things. 3)  Continue to take your medicines as the kidney doctor has instructed you. 4)  Make an appointment to come see me on Thursday afternoon in the early afternoon.  We will recheck your potassium then and make sure it is OK going into the weekend.   Orders Added: 1)  Basic Met[facility] [[address]-60454] 2)  12 Lead EKG [12 Lead EKG] 3)  [facility]- Est Level  3 [09811]

## 2010-07-11 NOTE — Letter (Signed)
Summary: Generic Letter  [facility]  [address]   [place], [place] [address]   Phone: [phone]  Fax: [phone]    02/21/2010  John [name] [address] [place], [place]  [address]  Dear [name],  It was good to see you in clinic again the other day.  We have the results of your blood tests below.    Tests: (2) Comprehensive Metabolic Panel (25956)   Sodium                    139 mEq/L                   135-145   Potassium                 5.3 mEq/L                   3.5-5.3   Chloride                  107 mEq/L                   96-112   CO2                       24 mEq/L                    19-32   Glucose              [H]  231 mg/dL                   38-75   BUN                  [H]  45 mg/dL                    6-43   Creatinine           [H]  2.37 mg/dL                  0.40-1.50     Result repeated and verified.   Bilirubin, Total          0.3 mg/dL                   3.2-9.5   Alkaline Phosphatase [H]  144 U/L                     39-117   AST/SGOT                  11 U/L                      0-37   ALT/SGPT                  8 U/L                       0-53   Total Protein             6.3 g/dL                    1.8-8.4   Albumin                   4.0 g/dL  3.5-5.2   Calcium                   8.6 mg/dL                   4.5-40.9  Tests: (3) Lipid Profile (81191)   Cholesterol               125 mg/dL                   4-782       Triglyceride              122 mg/dL                   <956   HDL Cholesterol           27 mg/dL                    >21                     LDL Cholesterol (Calc)    74 mg/dL                            For your cholesterol, your HDL (good cholesterol) is still a little low.  Continuing to work hard on your diet will help raise this back up.    More importantly, your BUN and creatinine are elevated.  These are kidney function tests.  We should definitely get you back in to see the folks over at  [facility].  We will refer you back there and have them call you with an appointment.    If you have any further questions, please call the clinic.    Sincerely,   Renold Don MD  Appended Document: Generic Letter mailed

## 2010-07-11 NOTE — Letter (Signed)
Summary: Generic Letter  [facility] Main Office  [address]   [place], [place] [address]   Phone: [phone]  Fax: [phone]    02/21/2010  John [name] [address] [place], [place]  [address]  Dear [name],  It was good to see you in clinic again the other day.  We have the results of your blood tests below.    Tests: (2) Comprehensive Metabolic Panel (57846)   Sodium                    139 mEq/L                   135-145   Potassium                 5.3 mEq/L                   3.5-5.3   Chloride                  107 mEq/L                   96-112   CO2                       24 mEq/L                    19-32   Glucose              [H]  231 mg/dL                   96-29   BUN                  [H]  45 mg/dL                    5-28   Creatinine           [H]  2.37 mg/dL                  0.40-1.50     Result repeated and verified.   Bilirubin, Total          0.3 mg/dL                   4.1-3.2   Alkaline Phosphatase [H]  144 U/L                     39-117   AST/SGOT                  11 U/L                      0-37   ALT/SGPT                  8 U/L                       0-53   Total Protein             6.3 g/dL                    4.4-0.1   Albumin                   4.0 g/dL  3.5-5.2   Calcium                   8.6 mg/dL                   0.4-54.0  Tests: (3) Lipid Profile (98119)   Cholesterol               125 mg/dL                   1-478       Triglyceride              122 mg/dL                   <295   HDL Cholesterol           27 mg/dL                    >62                     LDL Cholesterol (Calc)    74 mg/dL                            For your cholesterol, your HDL (good cholesterol) is still a little low.  Continuing to work hard on your diet will help raise this back up.    More importantly, your BUN and creatinine are elevated.  These are kidney function tests.  We should definitely get you back in to see the folks  over at [facility].  We will refer you back there and have them call you with an appointment.    If you have any further questions, please call the clinic.   Sincerely,   Renold Don MD   Appended Document: Generic Letter mailed  Appended Document: Generic Letter did not mail

## 2010-07-11 NOTE — Assessment & Plan Note (Signed)
Summary: shoulder pain,df   Vital Signs:  Patient profile:   55 year old male Height:      71 inches Weight:      206 pounds BMI:     28.84 BSA:     2.14 Temp:     98.5 degrees F Pulse rate:   103 / minute BP sitting:   171 / 85  Vitals Entered By: Jone Baseman CMA (July 12, 2009 2:37 PM) CC: rigth shoulder pain Is Patient Diabetic? No Pain Assessment Patient in pain? yes     Location: right shoulder Intensity: 8   Primary Care Provider:  Renold Don MD  CC:  rigth shoulder pain.  History of Present Illness: 55 yo male complaining of RIGHT shoulder pain for about 1 month.  Pain worse on internal rotation, elevation both in front and lateral raise.  Pain wakes him at night when he tries to sleep on his right side.  No loss of motor function, no loss of sensation.  States he feels pain everyday.  Pain is sharp in nature.  Can sometimes feel it spread across top of shoulders to left side as well.    ROS:  No paresthesias, numbness, loss of motor function.  No pain in left shoulder.  No change in pain in lower extremities.    Habits & Providers  Alcohol-Tobacco-Diet     Tobacco Status: current     Tobacco Counseling: to quit use of tobacco products     Cigarette Packs/Day: 0.5  Current Problems (verified): 1)  Shoulder Pain, Right  (ICD-719.41) 2)  Back Pain, Chronic  (ICD-724.5) 3)  Cerebrovascular Accident, Hx of  (ICD-V12.50) 4)  Diabetes Mellitus, II, Complications  (ICD-250.92) 5)  Hypertension, Benign Systemic  (ICD-401.1) 6)  Hyperlipidemia  (ICD-272.4) 7)  Renal Failure, Chronic - Stage Iii  (ICD-585.9) 8)  Hyperkalemia  (ICD-276.7) 9)  Microscopic Hematuria  (ICD-599.72) 10)  Abscess, Tooth  (ICD-522.5) 11)  Claudication, Intermittent  (ICD-443.9) 12)  Neuropathy, Diabetic  (ICD-250.60) 13)  Impotence, Organic  (ICD-607.84) 14)  Gait, Abnormal  (ICD-781.2) 15)  Tobacco Dependence  (ICD-305.1) 16)  Overweight  (ICD-278.02) 17)  Osteoarthritis,  Lower Leg  (ICD-715.96)  Current Medications (verified): 1)  Aspirin Ec Lo-Dose 81 Mg Tbec (Aspirin) .... Take 1 Tablet By Mouth Once A Day 2)  Humalog 100 Unit/ml Soln (Insulin Lispro (Human)) .... As Needed 3)  Lantus 100 Unit/ml Soln (Insulin Glargine) .... Inject 100 Unit Subcutaneously Once A Day 4)  Percocet 7.5-325 Mg  Tabs (Oxycodone-Acetaminophen) .... Take 1 Tab By Mouth Every 6 Hours For Pain - Do Not Fill Before Feb 15 5)  Simvastatin 80 Mg Tabs (Simvastatin) .... Take 1 Tablet By Mouth At Bedtime 6)  Amlodipine Besylate 10 Mg  Tabs (Amlodipine Besylate) .... One Tablet Daily For Blood Pressure Control 7)  Kayexalate   Powd (Sodium Polystyrene Sulfonate) .Marland Kitchen.. 15 G Po Daily. 8)  Furosemide 40 Mg Tabs (Furosemide) .Marland Kitchen.. 1 Tablet By Mouth Two Times A Day - Per Dr. Lacy Duverney 9)  Baclofen 10 Mg Tabs (Baclofen) .... Take 1 By Mouth Twice Daily. 10)  Azithromycin 500 Mg Tabs (Azithromycin) .... Take 2 Pills The First Day, and Then 1 Pill Daily After That For 4 More Days 11)  Tessalon 200 Mg Caps (Benzonatate) .... Take 1 Cap Every 4-6 Hours For Cough 12)  Gani-Tuss Dm Nr 100-10 Mg/32ml Liqd (Dextromethorphan-Guaifenesin) .... Sig: Take 1 Tsp By Mouth Every 4 Hours As Needed For Cough Disp 1 Bottle  Allergies (verified): No Known Drug Allergies  Physical Exam  General:  Vital signs reviewed Well-developed, well-nourished patient in NAD.  Awake, cooperative.  Msk:  Pain on lateral raising of LEFT arm.  Pain on both passive and active range of motion.  Decreased range of motion, patient not able to raise arm to shoulder level.  Able to put hand behind head, unable to place hand behind back.   Pulses:  Radial pulses present bilaterally  Extremities:  no loss of sensation bilateral upper or lower extremities.   Neurologic:  No loss of sensation or motor function bilateral upper extremities.     Impression & Recommendations:  Problem # 1:  SHOULDER PAIN, RIGHT (ICD-719.41) Assessment  New Injected 5 cc mixture of 3 cc Marcaine and 2 cc Kenolog 40 into shoulder joint.  Warned patient shoulder may be more painful tonight after injection after Marcaine wears off.  Want to see him back in 2 weeks to reassess pain and stiffness.  Recommended range of motion and strengthening exercises for rotator cuff muscles.   His updated medication list for this problem includes:    Aspirin Ec Lo-dose 81 Mg Tbec (Aspirin) .Marland Kitchen... Take 1 tablet by mouth once a day    Percocet 7.5-325 Mg Tabs (Oxycodone-acetaminophen) .Marland Kitchen... Take 1 tab by mouth every 6 hours for pain - do not fill before feb 15    Baclofen 10 Mg Tabs (Baclofen) .Marland Kitchen... Take 1 by mouth twice daily.  Orders: Injection, large joint- [facility] ([address]10)  Complete Medication List: 1)  Aspirin Ec Lo-dose 81 Mg Tbec (Aspirin) .... Take 1 tablet by mouth once a day 2)  Humalog 100 Unit/ml Soln (Insulin lispro (human)) .... As needed 3)  Lantus 100 Unit/ml Soln (Insulin glargine) .... Inject 100 unit subcutaneously once a day 4)  Percocet 7.5-325 Mg Tabs (Oxycodone-acetaminophen) .... Take 1 tab by mouth every 6 hours for pain - do not fill before feb 15 5)  Simvastatin 80 Mg Tabs (Simvastatin) .... Take 1 tablet by mouth at bedtime 6)  Amlodipine Besylate 10 Mg Tabs (Amlodipine besylate) .... One tablet daily for blood pressure control 7)  Kayexalate Powd (Sodium polystyrene sulfonate) .Marland Kitchen.. 15 g po daily. 8)  Furosemide 40 Mg Tabs (Furosemide) .Marland Kitchen.. 1 tablet by mouth two times a day - per dr. Lacy Duverney 9)  Baclofen 10 Mg Tabs (Baclofen) .... Take 1 by mouth twice daily. 10)  Azithromycin 500 Mg Tabs (Azithromycin) .... Take 2 pills the first day, and then 1 pill daily after that for 4 more days 11)  Tessalon 200 Mg Caps (Benzonatate) .... Take 1 cap every 4-6 hours for cough 12)  Gani-tuss Dm Nr 100-10 Mg/5ml Liqd (Dextromethorphan-guaifenesin) .... Sig: take 1 tsp by mouth every 4 hours as needed for cough disp 1 bottle

## 2010-07-11 NOTE — Miscellaneous (Signed)
  Clinical Lists Changes  Orders: Added new Referral order of Nephrology Referral (Nephro) - Signed

## 2010-07-11 NOTE — Miscellaneous (Signed)
  Clinical Lists Changes  Problems: Changed problem from RENAL FAILURE, CHRONIC - STAGE III (ICD-585.9) to CHRONIC KIDNEY DISEASE STAGE III (MODERATE) (ICD-585.3)

## 2010-07-11 NOTE — Progress Notes (Signed)
  Phone Note Outgoing Call Call back at [facility] Phone [phone]   Call placed by: Jimmy Footman, CMA,  June 18, 2010 4:28 PM Call placed to: Patient Summary of Call: Spoke with patient and informed to stop taking furosemide.  Initial call taken by: Jimmy Footman, CMA,  June 18, 2010 4:29 PM     Appended Document:  spoke with Wille Celeste at [facility] and was informed that pt did not keep his last appt and his appt in june he called and told them that he could not afford to come into for his appt.  janie informed me that mr. ionescu will have to call and set up his own appt due to the fact that he told her that he could not afford to come.  spoke with pt and told him that he will need to call their of[facility] and speak with janie and that it is imperative that he go be seen within the next week.  pt understood and agreed to this and will call.  I also spoke with dr. Gwendolyn Grant concerning this.

## 2010-07-11 NOTE — Progress Notes (Signed)
  Phone Note Refill Request Call back at [phone]   Refills Requested: Medication #1:  LANTUS 100 UNIT/ML SOLN Inject 100 unit subcutaneously once a day   Dosage confirmed as above?Dosage Confirmed [facility]  Initial call taken by: Abundio Miu,  March 07, 2010 9:44 AM  Follow-up for Phone Call        Completed.   Follow-up by: Renold Don MD,  March 07, 2010 8:41 PM    Prescriptions: LANTUS 100 UNIT/ML SOLN (INSULIN GLARGINE) Inject 100 unit subcutaneously once a day  #2400 x 11   Entered and Authorized by:   Renold Don MD   Signed by:   Renold Don MD on 03/07/2010   Method used:   [facility]ically to        [facility] [address] [phone]* (retail)       [address]       [place], [place]  [address]       Ph: [phone] or [phone]       Fax: [phone]   RxID:   [id number]

## 2010-07-11 NOTE — Assessment & Plan Note (Signed)
Summary: tic bite,tcb   Vital Signs:  Patient profile:   55 year old male Weight:      201 pounds Pulse rate:   92 / minute BP sitting:   120 / 66  (left arm) Cuff size:   regular  Vitals Entered By: Arlyss Repress CMA, (Oct 30, 2009 8:55 AM) CC: tick bite on neck. yesterday. Is Patient Diabetic? Yes Pain Assessment Patient in pain? no        Primary Care Provider:  Renold Don MD  CC:  tick bite on neck. yesterday.Marland Kitchen  History of Present Illness: John [name] comes in with tick bite.  Noticed it at 5 pm yesterday.  Wife removed it  Brought tick in with them.  Sits outside on their porch frequently but last time he was out in a wooded area was Friday afternoon.  Now there is a "bump" there and some redness.  Otherwise feeling well.  No fever, headache, myalgia, rash. Tick examined.  It is dead and dried up now but a[pears to be a dog tick.  Appears engorged as it is rounded.  Cannot definitively say it is nymphal but could be.    Habits & Providers  Alcohol-Tobacco-Diet     Tobacco Status: current     Tobacco Counseling: to quit use of tobacco products  Allergies: No Known Drug Allergies  Physical Exam  General:  Vital signs reviewed Well-developed, well-nourished patient in NAD.  Awake, cooperative.  Skin:  small, <.5 cm, area of induration surrounded by approx .5 cm of erythema in ellipitical pattern on right posterior neck.  No central clearing.  No fluctuance.  No streaking. No rash elsewhere on the body.     Impression & Recommendations:  Problem # 1:  INSECT BITE (ICD-919.4) Assessment New  Patient with documented tick bite.  Exposure most likely  ~48 hours.  This is long enough for rickettsial transmission.  Lyme is rare here as the ixodes tick is rare here.  Tick does appear consistent with dog tick, which is what carries RMSF.  < 12 hours since tick removed and no symptoms currently.  You do not prophylactically treat RMSF.  Discussed this with patient.  SYmptoms  usually appear 2-14 days after exposure.  He will monitor for headaches, myalgia, fever, rash and if any of these develop with come back right away as we would initiate doxycycline therapy.   Also monitor site of bite as it appears to be mild local cellulitis.  If spreads or develops fluctuance or pus will return for further evaluation fo that.   Orders: [facility]- Est Level  3 ([address])  Complete Medication List: 1)  Aspirin Ec Lo-dose 81 Mg Tbec (Aspirin) .... Take 1 tablet by mouth once a day 2)  Humalog 100 Unit/ml Soln (Insulin lispro (human)) .... As needed 3)  Lantus 100 Unit/ml Soln (Insulin glargine) .... Inject 100 unit subcutaneously once a day 4)  Percocet 7.5-325 Mg Tabs (Oxycodone-acetaminophen) .... Take 1 tab by mouth every 6 hours for pain - do not fill before april 15 5)  Simvastatin 80 Mg Tabs (Simvastatin) .... Take 1 tablet by mouth at bedtime 6)  Amlodipine Besylate 10 Mg Tabs (Amlodipine besylate) .... One tablet daily for blood pressure control 7)  Kayexalate Powd (Sodium polystyrene sulfonate) .Marland Kitchen.. 15 g po daily. 8)  Furosemide 40 Mg Tabs (Furosemide) .Marland Kitchen.. 1 tablet by mouth two times a day - per dr. Lacy Duverney 9)  Baclofen 10 Mg Tabs (Baclofen) .... Take 1 by mouth twice  daily. 10)  Azithromycin 500 Mg Tabs (Azithromycin) .... Take 2 pills the first day, and then 1 pill daily after that for 4 more days 11)  Tessalon 200 Mg Caps (Benzonatate) .... Take 1 cap every 4-6 hours for cough 12)  Gani-tuss Dm Nr 100-10 Mg/68ml Liqd (Dextromethorphan-guaifenesin) .... Sig: take 1 tsp by mouth every 4 hours as needed for cough disp 1 bottle

## 2010-07-11 NOTE — Assessment & Plan Note (Signed)
Summary: f/u eo   Vital Signs:  Patient profile:   55 year old male Height:      71 inches Weight:      200 pounds Temp:     98.9 degrees F Pulse rate:   106 / minute Pulse rhythm:   regular BP sitting:   132 / 80  (left arm) Cuff size:   large  Vitals Entered By: Loralee Pacas CMA (July 05, 2010 1:45 PM) CC: follow-up visit Is Patient Diabetic? Yes   Primary Provider:  Renold Don MD  CC:  follow-up visit.  History of Present Illness: Pt presents today for f/u of hyperkalemia.  COntinues taking NaHCO3 twice daily, miralax every other day per nephrology.  Has started having some problems with diarrhea since starting bicarb and continues to have problems with back and upper leg spasms since changing pain medications.  Otherwise, no cp/tachycardia,sob,weakness, or n/v.  Has no other complaints/concerns.  Habits & Providers  Alcohol-Tobacco-Diet     Tobacco Status: current     Tobacco Counseling: to quit use of tobacco products     Cigarette Packs/Day: 0.5  Current Medications (verified): 1)  Aspirin Ec Lo-Dose 81 Mg Tbec (Aspirin) .... Take 1 Tablet By Mouth Once A Day 2)  Humalog 100 Unit/ml Soln (Insulin Lispro (Human)) .... As Needed 3)  Lantus 100 Unit/ml Soln (Insulin Glargine) .... Inject 100 Unit Subcutaneously Once A Day 4)  Oxycodone-Acetaminophen 5-325 Mg Tabs (Oxycodone-Acetaminophen) .... Take 1 Pill Twice Daily As Needed For Pain 5)  Simvastatin 80 Mg Tabs (Simvastatin) .... Take 1 Tablet By Mouth At Bedtime 6)  Amlodipine Besylate 10 Mg  Tabs (Amlodipine Besylate) .... One Tablet Daily For Blood Pressure Control 7)  Kayexalate   Powd (Sodium Polystyrene Sulfonate) .Marland Kitchen.. 15 G Po Every Other Day. 8)  Furosemide 40 Mg Tabs (Furosemide) .Marland Kitchen.. 1 Tablet By Mouth Two Times A Day - Per Dr. Lacy Duverney 9)  Baclofen 10 Mg Tabs (Baclofen) .... Take 1 By Mouth Twice Daily. 10)  Gani-Tuss Dm Nr 100-10 Mg/18ml Liqd (Dextromethorphan-Guaifenesin) .... Sig: Take 1 Tsp By Mouth  Every 4 Hours As Needed For Cough Disp 1 Bottle 11)  Oxycontin 10 Mg Xr12h-Tab (Oxycodone Hcl) .... Take 1 Tab Twice Daily For Long-Acting Pain Relief 12)  Sodium Bicarbonate 650 Mg Tabs (Sodium Bicarbonate) .... Take Two Pills Two Times A Day  Allergies: No Known Drug Allergies  Past History:  Past medical, surgical, family and social histories (including risk factors) reviewed for relevance to current acute and chronic problems.  Past Medical History: Reviewed history from 04/13/2008 and no changes required. 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-80% med mgt - 11/10/2005 Echo: LV function lower limits of normal - 11/10/2005 MRI of Head sub acute CVA - 11/10/2005 MRI spine: 3-4-5 spinal stenosis,spinal cord edema - 11/10/2005 Botox from pain center for muscle spasm of L groin Fall at work (off of truck), R knee and spine injury 12/05 - s/p surgery 2006, uses cane ever since Lower ext spasm, weakness & gait disturbance - from spinal cord damage:  sees neuro Thad Ranger) and           neurosurg Wynetta Emery) + [facility] ([facility]) - referred to PT 11/09 Hospitalized with peptic ulcer 1984   Past Surgical History: Reviewed history from 04/13/2008 and no changes required. cervical myelopathy s/p diskectomy C3-4-5-6 - 11/10/2005 Left eye prosthesis - age 64  Family History: Reviewed history from 04/13/2008 and no changes required.  father died at age 47, had DM and CAD (CABG X3 in 56s) mother with DM and CAD (MI age 79) brother - DM brother - died at 33 of MI, DM, CHF  Social History: Reviewed history from 04/13/2008 and no changes required. 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  No alcohol or drugs.  Review of Systems  The patient denies anorexia, fever, weight loss, weight gain, vision loss, decreased hearing, chest pain, peripheral edema, prolonged cough, and  abdominal pain.         Otherwise negative, except as noted in HPI  Physical Exam  General:  Vital signs reviewed. Well-developed, well-nourished patient in NAD.  Awake and cooperative  Lungs:  clear to auscultation bilaterally without wheezing, rales, or rhonchi.  Normal work of breathing  Heart:  Regular rate and rhythm without murmur, rub, or gallop.  Normal S1/S2  Extremities:  no edema noted     Impression & Recommendations:  Problem # 1:  HYPERKALEMIA (ICD-276.7) Pt has had chronic hyperkalemia, with K ranging from 5.3-6 in the past year.  Has received an EKG that showed no acute changes, and has been recently seen by nephrology with followup with them planned.  Will obtain a BMET today to assure that K is remaining within the afforementioned range and plan followup with PCP.  No red flags today for hyperkalemia.  Will continue medications as per nephrology. Orders: Basic Met-[facility] ([id number]) [facility]- Est Level  3 ([address]48)  Complete Medication List: 1)  Aspirin Ec Lo-dose 81 Mg Tbec (Aspirin) .... Take 1 tablet by mouth once a day 2)  Humalog 100 Unit/ml Soln (Insulin lispro (human)) .... As needed 3)  Lantus 100 Unit/ml Soln (Insulin glargine) .... Inject 100 unit subcutaneously once a day 4)  Oxycodone-acetaminophen 5-325 Mg Tabs (Oxycodone-acetaminophen) .... Take 1 pill twice daily as needed for pain 5)  Simvastatin 80 Mg Tabs (Simvastatin) .... Take 1 tablet by mouth at bedtime 6)  Amlodipine Besylate 10 Mg Tabs (Amlodipine besylate) .... One tablet daily for blood pressure control 7)  Kayexalate Powd (Sodium polystyrene sulfonate) .Marland Kitchen.. 15 g po every other day. 8)  Furosemide 40 Mg Tabs (Furosemide) .Marland Kitchen.. 1 tablet by mouth two times a day - per dr. Lacy Duverney 9)  Baclofen 10 Mg Tabs (Baclofen) .... Take 1 by mouth twice daily. 10)  Gani-tuss Dm Nr 100-10 Mg/59ml Liqd (Dextromethorphan-guaifenesin) .... Sig: take 1 tsp by mouth every 4 hours as needed for cough disp 1 bottle 11)   Oxycontin 10 Mg Xr12h-tab (Oxycodone hcl) .... Take 1 tab twice daily for long-acting pain relief 12)  Sodium Bicarbonate 650 Mg Tabs (Sodium bicarbonate) .... Take two pills two times a day  Patient Instructions: 1)  It was great to see you today! 2)  We will recheck your potassium today.  We probably won't know the results until Monday.  I will call you if the number is high enough for [place] to worry about.  Otherwise I will send you a letter with the lab results. 3)  Make sure you get back in to see Dr. Gwendolyn Grant sometime within the next month or so.   Orders Added: 1)  Basic Met-[facility] [[id number]] 2)  [facility]- Est Level  3 [[address]]

## 2010-07-11 NOTE — Progress Notes (Signed)
Summary: Outgoing call to patient  Phone Note Outgoing Call   Call placed by: Renold Don MD,  June 21, 2010 2:59 PM Summary of Call: Spoke with patient regarding lab results.  Discussed elevated creatinine and elevated potassium.  Not taking any OTC nephrotoxic agents like NSAIDs.  No post-obstructive symptoms.  Unclear reason why he is having such acute elevation of creatinine in past few months.  Holding Furosemide currently.  He is to schedule an appt with [place] next week. He states he will hang up and call right now for an appt.   Initial call taken by: Patient

## 2010-07-11 NOTE — Assessment & Plan Note (Signed)
Summary: f/u labs,df   Vital Signs:  Patient profile:   55 year old male Height:      71 inches Weight:      200 pounds BMI:     28.00 Temp:     98.4 degrees F oral Pulse rate:   93 / minute BP sitting:   146 / 76  (left arm) Cuff size:   regular  Vitals Entered By: Garen Grams LPN (June 24, 2010 10:28 AM) CC: f/u labs Is Patient Diabetic? Yes Did you bring your meter with you today? No Pain Assessment Patient in pain? yes     Location: back   Primary Care Provider:  Renold Don MD  CC:  f/u labs.  History of Present Illness: 1.  FU elevated creatinine and potassium:  Last Cr 2.7, previously 2.3 in Sept of last year.  Denies any NSAIDs or other nephrotoxic agents.  Has not taken any Furosemide for past week since we called him to stop it.  Denies any BPH symptoms.  Good urine output, actually somewhat elevated per patient report though denies true polyuria or polydipsia.  Only rising once night to urinate if that.  Does describe foamy urine for past few months.  No severe dehydration, does not have diagnosis of CHF or liver disease.  Has not been seen at [facility] for about a year or so due to finances.  Has NOT been taking Kayexalate as prescribed.    ROS:  no edema, difficulty breathing, chest pain, palpitations, abdominal pain, weight gain or loss.   Habits & Providers  Alcohol-Tobacco-Diet     Tobacco Status: current     Tobacco Counseling: to quit use of tobacco products     Cigarette Packs/Day: 0.5  Current Medications (verified): 1)  Aspirin Ec Lo-Dose 81 Mg Tbec (Aspirin) .... Take 1 Tablet By Mouth Once A Day 2)  Humalog 100 Unit/ml Soln (Insulin Lispro (Human)) .... As Needed 3)  Lantus 100 Unit/ml Soln (Insulin Glargine) .... Inject 100 Unit Subcutaneously Once A Day 4)  Oxycodone-Acetaminophen 5-325 Mg Tabs (Oxycodone-Acetaminophen) .... Take 1 Pill Twice Daily As Needed For Pain 5)  Simvastatin 80 Mg Tabs (Simvastatin) .... Take 1 Tablet By Mouth At  Bedtime 6)  Amlodipine Besylate 10 Mg  Tabs (Amlodipine Besylate) .... One Tablet Daily For Blood Pressure Control 7)  Kayexalate   Powd (Sodium Polystyrene Sulfonate) .Marland Kitchen.. 15 G Po Daily. 8)  Furosemide 40 Mg Tabs (Furosemide) .Marland Kitchen.. 1 Tablet By Mouth Two Times A Day - Per Dr. Lacy Duverney 9)  Baclofen 10 Mg Tabs (Baclofen) .... Take 1 By Mouth Twice Daily. 10)  Gani-Tuss Dm Nr 100-10 Mg/26ml Liqd (Dextromethorphan-Guaifenesin) .... Sig: Take 1 Tsp By Mouth Every 4 Hours As Needed For Cough Disp 1 Bottle 11)  Oxycontin 10 Mg Xr12h-Tab (Oxycodone Hcl) .... Take 1 Tab Twice Daily For Long-Acting Pain Relief  Allergies (verified): No Known Drug Allergies  Review of Systems       see HPI  Physical Exam  General:  Vital signs reviewed. Well-developed, well-nourished patient in NAD.  Awake and cooperative  Lungs:  clear to auscultation bilaterally without wheezing, rales, or rhonchi.  Normal work of breathing  Heart:  Regular rate and rhythm without murmur, rub, or gallop.  Normal S1/S2  Abdomen:  obese, soft, benign exam.  Good bowel sounds without masses Extremities:  no edema noted   Neurologic:  Decreased sensation BL LE's.     Impression & Recommendations:  Problem # 1:  CHRONIC KIDNEY  DISEASE STAGE III (MODERATE) (ICD-585.3) Assessment Deteriorated Likely acute worsening secondary to combination of diuretics and long-standing diabetes mellitus II with complications (namely paresthesias in extremities).  However, other intra-renal causes of acute worsening need to be examined.  Will therefore refer to [facility].  Likely will need 24 hour protein, did not obtain today as patient states he is heading straight to Nephrologist after leaving appt.  Will not double up labs, but will consider in future if they do not obtain.  Less likely pre- or post- renal causes.  See instructions regarding potassium.  FU after being seen by nephro.  Repeat CMET today.   Orders: Comp Met-[facility]  ([id number])  Complete Medication List: 1)  Aspirin Ec Lo-dose 81 Mg Tbec (Aspirin) .... Take 1 tablet by mouth once a day 2)  Humalog 100 Unit/ml Soln (Insulin lispro (human)) .... As needed 3)  Lantus 100 Unit/ml Soln (Insulin glargine) .... Inject 100 unit subcutaneously once a day 4)  Oxycodone-acetaminophen 5-325 Mg Tabs (Oxycodone-acetaminophen) .... Take 1 pill twice daily as needed for pain 5)  Simvastatin 80 Mg Tabs (Simvastatin) .... Take 1 tablet by mouth at bedtime 6)  Amlodipine Besylate 10 Mg Tabs (Amlodipine besylate) .... One tablet daily for blood pressure control 7)  Kayexalate Powd (Sodium polystyrene sulfonate) .Marland Kitchen.. 15 g po daily. 8)  Furosemide 40 Mg Tabs (Furosemide) .Marland Kitchen.. 1 tablet by mouth two times a day - per dr. Lacy Duverney 9)  Baclofen 10 Mg Tabs (Baclofen) .... Take 1 by mouth twice daily. 10)  Gani-tuss Dm Nr 100-10 Mg/29ml Liqd (Dextromethorphan-guaifenesin) .... Sig: take 1 tsp by mouth every 4 hours as needed for cough disp 1 bottle 11)  Oxycontin 10 Mg Xr12h-tab (Oxycodone hcl) .... Take 1 tab twice daily for long-acting pain relief  Patient Instructions: 1)  We will check your labs again today.   2)  Keep taking your insulin as prescribed.  We might have to increase it in the near future to help keep your blood sugars under control.   3)  Take the Kayexalate once daily for the next month or so.  Go ahead and take the 1st dose when you get home today.  Based on your labs today, we might need to increase it to twice daily.   4)  Call Janie at Dr. Jon Gills office to get an appt set up for this week asap.   5)  Come back and see me after you've seen Dr. Kathrene Bongo.   6)  Call with any questions.     Orders Added: 1)  Comp Met-[facility] [[id number]]  Appended Document: Added Orders    Clinical Lists Changes  Orders: Added new Test order of [facility]- Est Level  3 (29562) - Signed

## 2010-07-11 NOTE — Assessment & Plan Note (Signed)
Summary: f/u,df   Vital Signs:  Patient profile:   55 year old male Height:      71 inches Weight:      205.1 pounds BMI:     28.71 Temp:     98.3 degrees F oral Pulse rate:   94 / minute BP sitting:   129 / 85  (left arm) Cuff size:   regular  Vitals Entered By: Garen Grams LPN (February 18, 2010 3:56 PM) CC: f/u dm Is Patient Diabetic? Yes Did you bring your meter with you today? No Pain Assessment Patient in pain? no        Primary Care Provider:  Renold Don MD  CC:  f/u dm.  History of Present Illness: 1.  Diabetes:  measures blood sugars at home, states they run 140-180s.  States he's been "slipping" some on watching his diet.  Tolerating meds well.  No med changes recently.    2.  Back pain:  controlled on medications.  Does state that he can tell increase in pain on days that he has not had medicine.  Chronic issue for this patient.  Radiates to bilateral lower extremities.    ROS:  no headaches, pre-syncopal or syncopal episodes, chest pain, palpitations, shortness of breath or dyspnea, abdominal pain, diarrhea or constipation, melena, hematochezia, lower extremity swelling.  No polyuria, polydipsia, polyphagia, paresthesias.     Habits & Providers  Alcohol-Tobacco-Diet     Tobacco Status: current     Tobacco Counseling: to quit use of tobacco products     Cigarette Packs/Day: 0.5  Current Problems (verified): 1)  Shoulder Pain, Right  (ICD-719.41) 2)  Back Pain, Chronic  (ICD-724.5) 3)  Cerebrovascular Accident, Hx of  (ICD-V12.50) 4)  Diabetes Mellitus, II, Complications  (ICD-250.92) 5)  Hypertension, Benign Systemic  (ICD-401.1) 6)  Hyperlipidemia  (ICD-272.4) 7)  Renal Failure, Chronic - Stage Iii  (ICD-585.9) 8)  Claudication, Intermittent  (ICD-443.9) 9)  Neuropathy, Diabetic  (ICD-250.60) 10)  Impotence, Organic  (ICD-607.84) 11)  Gait, Abnormal  (ICD-781.2) 12)  Tobacco Dependence  (ICD-305.1) 13)  Overweight  (ICD-278.02) 14)   Osteoarthritis, Lower Leg  (ICD-715.96)  Current Medications (verified): 1)  Aspirin Ec Lo-Dose 81 Mg Tbec (Aspirin) .... Take 1 Tablet By Mouth Once A Day 2)  Humalog 100 Unit/ml Soln (Insulin Lispro (Human)) .... As Needed 3)  Lantus 100 Unit/ml Soln (Insulin Glargine) .... Inject 100 Unit Subcutaneously Once A Day 4)  Percocet 7.5-325 Mg  Tabs (Oxycodone-Acetaminophen) .... Take 1 Tab By Mouth Every 6 Hours For Pain - Do Not Fill Before 03/20/10 5)  Simvastatin 80 Mg Tabs (Simvastatin) .... Take 1 Tablet By Mouth At Bedtime 6)  Amlodipine Besylate 10 Mg  Tabs (Amlodipine Besylate) .... One Tablet Daily For Blood Pressure Control 7)  Kayexalate   Powd (Sodium Polystyrene Sulfonate) .Marland Kitchen.. 15 G Po Daily. 8)  Furosemide 40 Mg Tabs (Furosemide) .Marland Kitchen.. 1 Tablet By Mouth Two Times A Day - Per Dr. Lacy Duverney 9)  Baclofen 10 Mg Tabs (Baclofen) .... Take 1 By Mouth Twice Daily. 10)  Azithromycin 500 Mg Tabs (Azithromycin) .... Take 2 Pills The First Day, and Then 1 Pill Daily After That For 4 More Days 11)  Tessalon 200 Mg Caps (Benzonatate) .... Take 1 Cap Every 4-6 Hours For Cough 12)  Gani-Tuss Dm Nr 100-10 Mg/17ml Liqd (Dextromethorphan-Guaifenesin) .... Sig: Take 1 Tsp By Mouth Every 4 Hours As Needed For Cough Disp 1 Bottle  Allergies (verified): No Known Drug Allergies  Past  History:  Past medical, surgical, family and social histories (including risk factors) reviewed, and no changes noted (except as noted below).  Past Medical History: Reviewed history from 04/13/2008 and no changes required. 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-80% med mgt - 11/10/2005 Echo: LV function lower limits of normal - 11/10/2005 MRI of Head sub acute CVA - 11/10/2005 MRI spine: 3-4-5 spinal stenosis,spinal cord edema - 11/10/2005 Botox from pain center for muscle spasm of L groin Fall at work (off of truck), R knee and spine injury 12/05 - s/p surgery 2006, uses cane  ever since Lower ext spasm, weakness & gait disturbance - from spinal cord damage:  sees neuro Thad Ranger) and           neurosurg Wynetta Emery) + [facility] ([facility]) - referred to PT 11/09 Hospitalized with peptic ulcer 1984   Past Surgical History: Reviewed history from 04/13/2008 and no changes required. cervical myelopathy s/p diskectomy C3-4-5-6 - 11/10/2005 Left eye prosthesis - age 48  Family History: Reviewed history from 04/13/2008 and no changes required. father died at age 57, had DM and CAD (CABG X3 in 70s) mother with DM and CAD (MI age 59) brother - DM brother - died at 22 of MI, DM, CHF  Social History: Reviewed history from 04/13/2008 and no changes required. 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  No alcohol or drugs.  Physical Exam  General:  Vital signs reviewed. Well-developed, well-nourished patient in NAD.  Awake and cooperative  Mouth:  oral mucosa moist Lungs:  clear to auscultation bilaterally without wheezing, rales, or rhonchi.  Normal work of breathing  Heart:  Regular rate and rhythm without murmur, rub, or gallop.  Normal S1/S2  Abdomen:  soft/nondistended/nontender.  Bowel sounds present and normoactive.  No organomegaly noted.   Msk:  Still with point tenderness along lumbar spine.  Also tenderness along lower back and radiating to bilateral legs. Straight leg test positive.  Extremities:  No clubbing, cyanosis, edema, or deformity noted with normal full range of motion of all joints.   Neurologic:  Patient ambulates with cane chronically.  Otherwise no focal deficits.     Impression & Recommendations:  Problem # 1:  DIABETES MELLITUS, II, COMPLICATIONS (ICD-250.92) Assessment Deteriorated A1C 7.4 today, last measured at 7.2  Has been creeping upwards every 3 months for past year or so.  May need to increase meds, would rather incrase humalog to more regularly with  meals since he is already taking 100 units subQ of Lantus.  Discussed eye exam, he has not yet seen ophthamologist this year.  Due to insurance, would rather postpone this visit to beginning of 2012, which would be in 4 months.  Also needs to see podiatrist, patient states he would rather find one on his own.   His updated medication list for this problem includes:    Aspirin Ec Lo-dose 81 Mg Tbec (Aspirin) .Marland Kitchen... Take 1 tablet by mouth once a day    Humalog 100 Unit/ml Soln (Insulin lispro (human)) .Marland Kitchen... As needed    Lantus 100 Unit/ml Soln (Insulin glargine) ..... Inject 100 unit subcutaneously once a day  Orders: A1C-[facility] (16109) [facility]- Est  Level 4 (99214)Future Orders: CBC-[facility] (60454) ... 02/14/2011 Basic Met-[facility] ([id number]) ... 02/08/2011 Lipid-[facility] ([id number]) ... 02/21/2011  Problem # 2:  BACK PAIN, CHRONIC (ICD-724.5) Assessment: Unchanged Provided 3 month supply of Percocet.  Discussed changing to longer acting pain medication.  Patient considering this.  Otherwise no increase or decrease in chronic function.   His updated medication list for this problem includes:    Aspirin Ec Lo-dose 81 Mg Tbec (Aspirin) .Marland Kitchen... Take 1 tablet by mouth once a day    Percocet 7.5-325 Mg Tabs (Oxycodone-acetaminophen) .Marland Kitchen... Take 1 tab by mouth every 6 hours for pain - do not fill before 03/20/10    Baclofen 10 Mg Tabs (Baclofen) .Marland Kitchen... Take 1 by mouth twice daily.  Orders: [facility]- Est  Level 4 (16109)  Problem # 3:  HYPERLIPIDEMIA (ICD-272.4) Will obtain FLP at nurse visit.  Tolerating 80 mg Simvastatin, discussed increased risk of myopathy and liver function abnormailities, patient will stay on this medication after discussing risk and benefits of treatment in patient with diabetes and HTN.   His updated medication list for this problem includes:    Simvastatin 80 Mg Tabs (Simvastatin) .Marland Kitchen... Take 1 tablet by mouth at bedtime  Problem # 4:  RENAL FAILURE, CHRONIC - STAGE III (ICD-585.9) Will recheck  Cr at next FLP draw.  Is followed at [facility].  Last seen 5 months ago.    Complete Medication List: 1)  Aspirin Ec Lo-dose 81 Mg Tbec (Aspirin) .... Take 1 tablet by mouth once a day 2)  Humalog 100 Unit/ml Soln (Insulin lispro (human)) .... As needed 3)  Lantus 100 Unit/ml Soln (Insulin glargine) .... Inject 100 unit subcutaneously once a day 4)  Percocet 7.5-325 Mg Tabs (Oxycodone-acetaminophen) .... Take 1 tab by mouth every 6 hours for pain - do not fill before 03/20/10 5)  Simvastatin 80 Mg Tabs (Simvastatin) .... Take 1 tablet by mouth at bedtime 6)  Amlodipine Besylate 10 Mg Tabs (Amlodipine besylate) .... One tablet daily for blood pressure control 7)  Kayexalate Powd (Sodium polystyrene sulfonate) .Marland Kitchen.. 15 g po daily. 8)  Furosemide 40 Mg Tabs (Furosemide) .Marland Kitchen.. 1 tablet by mouth two times a day - per dr. Lacy Duverney 9)  Baclofen 10 Mg Tabs (Baclofen) .... Take 1 by mouth twice daily. 10)  Azithromycin 500 Mg Tabs (Azithromycin) .... Take 2 pills the first day, and then 1 pill daily after that for 4 more days 11)  Tessalon 200 Mg Caps (Benzonatate) .... Take 1 cap every 4-6 hours for cough 12)  Gani-tuss Dm Nr 100-10 Mg/68ml Liqd (Dextromethorphan-guaifenesin) .... Sig: take 1 tsp by mouth every 4 hours as needed for cough disp 1 bottle  Patient Instructions: 1)  Make a lab appt to have your cholesterol checked. 2)  Your meds have been refilled. 3)  We will refer you to an ophthamologist at the beginning of next year.  Prescriptions: PERCOCET 7.5-325 MG  TABS (OXYCODONE-ACETAMINOPHEN) Take 1 tab by mouth every 6 hours for pain - DO NOT FILL BEFORE 03/20/10  #120 x 0   Entered and Authorized by:   Renold Don MD   Signed by:   Renold Don MD on 02/18/2010   Method used:   Print then Give to Patient   RxID:   [id number] PERCOCET 7.5-325 MG  TABS (OXYCODONE-ACETAMINOPHEN) Take 1 tab by mouth every 6 hours for pain - DO NOT FILL BEFORE 04/20/10  #120 x 0   Entered and  Authorized by:   Renold Don MD   Signed by:   Renold Don MD on 02/18/2010   Method used:   Print then Give to Patient   RxID:   [id number] PERCOCET 7.5-325 MG  TABS (OXYCODONE-ACETAMINOPHEN) Take 1 tab  by mouth every 6 hours for pain - DO NOT FILL BEFORE 04/20/10  #0 x 0   Entered and Authorized by:   Renold Don MD   Signed by:   Renold Don MD on 02/18/2010   Method used:   Print then Give to Patient   RxID:   [id number] PERCOCET 7.5-325 MG  TABS (OXYCODONE-ACETAMINOPHEN) Take 1 tab by mouth every 6 hours for pain - DO NOT FILL BEFORE 03/20/10  #0 x 0   Entered and Authorized by:   Renold Don MD   Signed by:   Renold Don MD on 02/18/2010   Method used:   Print then Give to Patient   RxID:   [id number] PERCOCET 7.5-325 MG  TABS (OXYCODONE-ACETAMINOPHEN) Take 1 tab by mouth every 6 hours for pain  #120 x 0   Entered and Authorized by:   Renold Don MD   Signed by:   Renold Don MD on 02/18/2010   Method used:   Print then Give to Patient   RxID:   [id number] PERCOCET 7.5-325 MG  TABS (OXYCODONE-ACETAMINOPHEN) Take 1 tab by mouth every 6 hours for pain  #120 x 0   Entered and Authorized by:   Renold Don MD   Signed by:   Terese Door on 02/18/2010   Method used:   Print then Give to Patient   RxID:   [id number] AMLODIPINE BESYLATE 10 MG  TABS (AMLODIPINE BESYLATE) One tablet daily for blood pressure control  #34 x 3   Entered and Authorized by:   Renold Don MD   Signed by:   Renold Don MD on 02/18/2010   Method used:   Electronically to        [facility] [address] [phone]* (retail)       [address]       [address]       [place], [place]  [address]       Ph: [phone]       Fax: [phone]   RxID:   [id number] SIMVASTATIN 80 MG TABS (SIMVASTATIN) Take 1 tablet by mouth at bedtime  #34 x 3   Entered and Authorized by:   Renold Don MD   Signed by:   Renold Don MD on 02/18/2010   Method used:   Electronically to        [facility] [address] [phone]* (retail)       [address]       [address]       [place], [place]  [address]       Ph: [phone]       Fax: [phone]   RxID:   [id number]   Laboratory Results   Blood Tests   Date/Time Received: February 18, 2010 3:50 PM  Date/Time Reported: February 18, 2010 4:19 PM   HGBA1C: 7.4%   (Normal Range: Non-Diabetic - 3-6%   Control Diabetic - 6-8%)  Comments: ...........test performed by...........Marland KitchenTerese Door, CMA

## 2010-07-11 NOTE — Progress Notes (Signed)
Summary: Rx Req  Phone Note Refill Request Call back at Home Phone [phone] Message from:  Patient  Refills Requested: Medication #1:  PERCOCET 7.5-325 MG  TABS Take 1 tab by mouth every 6 hours for pain Initial call taken by: Clydell Hakim,  February 12, 2010 3:45 PM  Follow-up for Phone Call        Rx denied because pt is due to be seen for diabetes as well as to check in for his narcotics Follow-up by: Antoine Primas DO,  February 12, 2010 8:25 PM

## 2010-07-11 NOTE — Assessment & Plan Note (Signed)
Summary: FU/KH   Vital Signs:  Patient profile:   55 year old male Weight:      194.4 pounds Temp:     98.2 degrees F oral Pulse rate:   98 / minute BP sitting:   125 / 75  (left arm)  Vitals Entered By: Loralee Pacas CMA (August 09, 2009 1:50 PM)  Primary Care Provider:  Renold Don MD  CC:  shoulder pain followup.  History of Present Illness: Pt returned to clinic for RIGHT shoulder pain follow up today.  Received cortisone shot prior visit.  States that shoulder is much improved, no more pain, mobility back to baseline.   Tolerated procedure well, no problems with infection at site of injection.  Continues to do strength building and range of motion exercies, about 3 x weekly.    ROS:  no headaches.  Continued back and BL LE pain, which is chronic for this patient.  No abd pain, no N/V, no lower extremity edema.  No chest pain or shortness of breath.  No cough or recent illness.    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 Take 1 Tablet By Mouth Once A Day 2)  Humalog 100 Unit/ml Soln (Insulin Lispro (Human)) .... As Needed 3)  Lantus 100 Unit/ml Soln (Insulin Glargine) .... Inject 100 Unit Subcutaneously Once A Day 4)  Percocet  7.5-325 Mg  Tabs (Oxycodone-Acetaminophen) .... Take 1 Tab By Mouth Every 6 Hours For Pain - Do Not Fill Before April 15 5)  Simvastatin 80 Mg Tabs (Simvastatin) .... Take 1 Tablet By Mouth At Bedtime 6)  Amlodipine Besylate 10 Mg  Tabs (Amlodipine Besylate) .... One Tablet Daily For Blood Pressure Control 7)  Kayexalate   Powd (Sodium Polystyrene Sulfonate) .Marland Kitchen.. 15 G Po Daily. 8)  Furosemide 40 Mg Tabs (Furosemide) .Marland Kitchen.. 1 Tablet By Mouth Two Times A Day - Per Dr. Lacy Duverney 9)  Baclofen 10 Mg Tabs (Baclofen) .... Take 1 By Mouth Twice Daily. 10)  Azithromycin 500 Mg Tabs (Azithromycin) .... Take 2 Pills The First Day, and Then 1 Pill Daily After That For 4 More Days 11)  Tessalon 200 Mg Caps (Benzonatate) .... Take 1 Cap Every 4-6 Hours For Cough 12)  Gani-Tuss Dm Nr 100-10 Mg/20ml Liqd (Dextromethorphan-Guaifenesin) .... Sig: Take 1 Tsp By Mouth Every 4 Hours As Needed For Cough Disp 1 Bottle  Allergies (verified): No Known Drug Allergies  Past History:  Past medical, surgical, family and social histories (including risk factors) reviewed, and no changes noted (except as noted below).  Past Medical History: Reviewed history from 04/13/2008 and no changes required. 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-80% med mgt - 11/10/2005 Echo: LV function lower limits of normal - 11/10/2005 MRI of Head sub  acute CVA - 11/10/2005 MRI spine: 3-4-5 spinal stenosis,spinal cord edema - 11/10/2005 Botox from pain center for muscle spasm of L groin Fall at work (off of truck), R knee and spine injury 12/05 - s/p surgery 2006, uses cane ever since Lower ext spasm, weakness & gait disturbance - from spinal cord damage:  sees neuro Thad Ranger) and           neurosurg Wynetta Emery) + [facility]) - referred to PT 11/09 Hospitalized with peptic ulcer 1984   Past Surgical History: Reviewed history from 04/13/2008 and no changes required. cervical myelopathy s/p  diskectomy C3-4-5-6 - 11/10/2005 Left eye prosthesis - age 28  Family History: Reviewed history from 04/13/2008 and no changes required. father died at age 63, had DM and CAD (CABG X3 in 72s) mother with DM and CAD (MI age 30) brother - DM brother - died at 50 of MI, DM, CHF  Social History: Reviewed history from 04/13/2008 and no changes required. 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  No alcohol or drugs.  Physical Exam  General:  Vital signs reviewed Well-developed, well-nourished patient in NAD.  Awake, cooperative.  Head:  Normocephalic and atraumatic without obvious abnormalities. No apparent alopecia or balding. Mouth:  Oral mucosa and oropharynx without lesions or exudates.  Teeth in good repair. Lungs:  clear to auscultation bilaterally  Msk:  full active and passive range of motion.  no areas of tenderness.  no crepitus noted.   Pulses:  radial pulses good bilaterally  Extremities:  no edema noted all 4 extremities Neurologic:  sensation intact throughout both upper extremities.  Motor function preserved, strength 5/5 bilateral upper extermities.    Impression & Recommendations:  Problem # 1:  SHOULDER PAIN, RIGHT (ICD-719.41) Assessment Improved Much improved s/p cortisone injection.  Will continue to follow.   His updated medication list for this problem includes:    Aspirin Ec Lo-dose 81 Mg Tbec (Aspirin) .Marland Kitchen... Take 1 tablet by mouth once a day    Percocet 7.5-325 Mg Tabs (Oxycodone-acetaminophen) .Marland Kitchen... Take 1 tab by mouth every 6 hours for pain - do not fill before april 15    Baclofen 10 Mg Tabs (Baclofen) .Marland Kitchen... Take 1 by mouth twice daily.  Orders: [facility]- Est Level  3 (82956)  Problem # 2:  BACK PAIN, CHRONIC (ICD-724.5) Refilled patient's Percocet.  Discussed long-term opiate use and possible side effects as well as dependence.  Patient feels he still needs opiates at this  time.  When asked if he had thought about being on them for the rest of his life, he states that he has thought about this and thinks he may have to have them for the rest of his life. Plan to revisit this issue with patient in future.  Pre-contemplative currently regarding stopping opiate use.    His updated medication list for this problem includes:    Aspirin Ec Lo-dose 81 Mg Tbec (Aspirin) .Marland Kitchen... Take 1 tablet by mouth once a day    Percocet 7.5-325 Mg Tabs (Oxycodone-acetaminophen) .Marland Kitchen... Take 1 tab by mouth every 6 hours for pain - do not fill before april 15    Baclofen 10 Mg Tabs (Baclofen) .Marland Kitchen... Take 1 by mouth twice daily.  Complete Medication List: 1)  Aspirin Ec Lo-dose 81 Mg Tbec (Aspirin) .... Take 1 tablet by mouth once a day 2)  Humalog 100 Unit/ml Soln (Insulin lispro (human)) .... As needed  3)  Lantus 100 Unit/ml Soln (Insulin glargine) .... Inject 100 unit subcutaneously once a day 4)  Percocet 7.5-325 Mg Tabs (Oxycodone-acetaminophen) .... Take 1 tab by mouth every 6 hours for pain - do not fill before april 15 5)  Simvastatin 80 Mg Tabs (Simvastatin) .... Take 1 tablet by mouth at bedtime 6)  Amlodipine Besylate 10 Mg Tabs (Amlodipine besylate) .... One tablet daily for blood pressure control 7)  Kayexalate Powd (Sodium polystyrene sulfonate) .Marland Kitchen.. 15 g po daily. 8)  Furosemide 40 Mg Tabs (Furosemide) .Marland Kitchen.. 1 tablet by mouth two times a day - per dr. Lacy Duverney 9)  Baclofen 10 Mg Tabs (Baclofen) .... Take 1 by mouth twice daily. 10)  Azithromycin 500 Mg Tabs (Azithromycin) .... Take 2 pills the first day, and then 1 pill daily after that for 4 more days 11)  Tessalon 200 Mg Caps (Benzonatate) .... Take 1 cap every 4-6 hours for cough 12)  Gani-tuss Dm Nr 100-10 Mg/50ml Liqd (Dextromethorphan-guaifenesin) .... Sig: take 1 tsp by mouth every 4 hours as needed for cough disp 1 bottle  Other Orders: [facility]C-[facility] ([address]) Prescriptions: PERCOCET 7.5-325 MG  TABS (OXYCODONE-ACETAMINOPHEN)  Take 1 tab by mouth every 6 hours for pain - DO NOT FILL BEFORE APRIL 15  #120 x 0   Entered and Authorized by:   Renold Don MD   Signed by:   Renold Don MD on 08/09/2009   Method used:   Print then Give to Patient   RxID:   [id number] PERCOCET 7.5-325 MG  TABS (OXYCODONE-ACETAMINOPHEN) Take 1 tab by mouth every 6 hours for pain  #120 x 0   Entered and Authorized by:   Renold Don MD   Signed by:   Renold Don MD on 08/09/2009   Method used:   Print then Give to Patient   RxID:   [id number] PERCOCET 7.5-325 MG  TABS (OXYCODONE-ACETAMINOPHEN) Take 1 tab by mouth every 6 hours for pain - DO NOT FILL before March 15  #120 x 0   Entered and Authorized by:   Renold Don MD   Signed by:   Renold Don MD on 08/09/2009   Method used:   Print then Give to Patient   RxID:   [id number] PERCOCET 7.5-325 MG  TABS (OXYCODONE-ACETAMINOPHEN) Take 1 tab by mouth every 6 hours for pain - DO NOT FILL before Feb 15  #120 x 0   Entered and Authorized by:   Renold Don MD   Signed by:   Renold Don MD on 08/09/2009   Method used:   Print then Give to Patient   RxID:   [id number]   Laboratory Results   Blood Tests   Date/Time Received: August 09, 2009 1:54 PM  Date/Time Reported: August 09, 2009 2:21 PM   HGBA1C: 7.3%   (Normal Range: Non-Diabetic - 3-6%   Control Diabetic - 6-8%)  Comments: ...............test performed by......Marland KitchenBonnie A. Swaziland, MLS (ASCP)cm

## 2010-07-11 NOTE — Assessment & Plan Note (Signed)
Summary: f/u meds/bmc   Vital Signs:  Patient profile:   55 year old male Weight:      196 pounds Temp:     98.3 degrees F oral Pulse rate:   98 / minute Pulse rhythm:   regular BP sitting:   164 / 84  (left arm) Cuff size:   regular  Vitals Entered By: Loralee Pacas CMA (June 17, 2010 10:13 AM)  Serial Vital Signs/Assessments:  Time      Position  BP       Pulse  Resp  Temp     By                     142/82                         Renold Don MD  CC: med Is Patient Diabetic? Yes   Primary Care Provider:  Renold Don MD  CC:  med.  History of Present Illness: 1.  Cough:  dry hacking cough for past several weeks.  States it doesn't bother him too much but wife wanted him to be seen.  Occasionally does have some phlegm production but in general not much.  No nasal drainage.  Did have some nasal congestion several weeks ago which started cough.    2.  Med refills:  Would like more pain medication refills.  See CPOE.  3.  Claudication symptoms:  Recent worsening for past several weeks.  Pain in Left calf.  Knows exactly how far he can walk which will trigger symtoms, about 30 yards.  Improves with rest and he can continue walking.  Still smoking.    ROS:  no headaches, pre-syncopal or syncopal episodes, chest pain, palpitations, shortness of breath or dyspnea, abdominal pain, diarrhea or constipation, melena, hematochezia, lower extremity swelling.    Current Problems (verified): 1)  Shoulder Pain, Right  (ICD-719.41) 2)  Back Pain, Chronic  (ICD-724.5) 3)  Cerebrovascular Accident, Hx of  (ICD-V12.50) 4)  Diabetes Mellitus, II, Complications  (ICD-250.92) 5)  Hypertension, Benign Systemic  (ICD-401.1) 6)  Hyperlipidemia  (ICD-272.4) 7)  Chronic Kidney Disease Stage Iii (MODERATE)  (ICD-585.3) 8)  Claudication, Intermittent  (ICD-443.9) 9)  Neuropathy, Diabetic  (ICD-250.60) 10)  Impotence, Organic  (ICD-607.84) 11)  Gait, Abnormal  (ICD-781.2) 12)  Tobacco Dependence   (ICD-305.1) 13)  Overweight  (ICD-278.02) 14)  Osteoarthritis, Lower Leg  (ICD-715.96)  Current Medications (verified): 1)  Aspirin Ec Lo-Dose 81 Mg Tbec (Aspirin) .... Take 1 Tablet By Mouth Once A Day 2)  Humalog 100 Unit/ml Soln (Insulin Lispro (Human)) .... As Needed 3)  Lantus 100 Unit/ml Soln (Insulin Glargine) .... Inject 100 Unit Subcutaneously Once A Day 4)  Percocet 7.5-325 Mg  Tabs (Oxycodone-Acetaminophen) .... Take 1 Tab By Mouth Every 6 Hours For Pain 5)  Simvastatin 80 Mg Tabs (Simvastatin) .... Take 1 Tablet By Mouth At Bedtime 6)  Amlodipine Besylate 10 Mg  Tabs (Amlodipine Besylate) .... One Tablet Daily For Blood Pressure Control 7)  Kayexalate   Powd (Sodium Polystyrene Sulfonate) .Marland Kitchen.. 15 G Po Daily. 8)  Furosemide 40 Mg Tabs (Furosemide) .Marland Kitchen.. 1 Tablet By Mouth Two Times A Day - Per Dr. Lacy Duverney 9)  Baclofen 10 Mg Tabs (Baclofen) .... Take 1 By Mouth Twice Daily. 10)  Azithromycin 500 Mg Tabs (Azithromycin) .... Take 2 Pills The First Day, and Then 1 Pill Daily After That For 4 More Days 11)  Tessalon 200  Mg Caps (Benzonatate) .... Take 1 Cap Every 4-6 Hours For Cough 12)  Gani-Tuss Dm Nr 100-10 Mg/62ml Liqd (Dextromethorphan-Guaifenesin) .... Sig: Take 1 Tsp By Mouth Every 4 Hours As Needed For Cough Disp 1 Bottle  Allergies (verified): No Known Drug Allergies  Physical Exam  General:  Vital signs reviewed. Well-developed, well-nourished patient in NAD.  Awake and cooperative  Lungs:  clear to auscultation bilaterally without wheezing, rales, or rhonchi.  Normal work of breathing  Heart:  Regular rate and rhythm without murmur, rub, or gallop.  Normal S1/S2  Abdomen:  soft/nondistended/nontender.  Bowel sounds present and normoactive.  No organomegaly noted.   Msk:  Normal ROM in LE's.   Extremities:  No erythema or edema noted BL LE's.  Hair loss evident.   Neurologic:  Decreased sensation BL LE's.     Impression & Recommendations:  Problem # 1:  ACUTE  BRONCHITIS (ICD-466.0) Likely from URI that began several weeks ago.  Symptom management only.  No new meds.   The following medications were removed from the medication list:    Azithromycin 500 Mg Tabs (Azithromycin) .Marland Kitchen... Take 2 pills the first day, and then 1 pill daily after that for 4 more days    Tessalon 200 Mg Caps (Benzonatate) .Marland Kitchen... Take 1 cap every 4-6 hours for cough His updated medication list for this problem includes:    Gani-tuss Dm Nr 100-10 Mg/85ml Liqd (Dextromethorphan-guaifenesin) ..... Sig: take 1 tsp by mouth every 4 hours as needed for cough disp 1 bottle  Problem # 2:  BACK PAIN, CHRONIC (ICD-724.5) Long discussion with patient again about chronic pain meds.  He is now amenable to long-term medications.  Precepted with Dr. Swazil[place] and sports med fellow.  Will start Oxycontin with Oxycodone for break-through relief as detailed below.   His updated medication list for this problem includes:    Aspirin Ec Lo-dose 81 Mg Tbec (Aspirin) .Marland Kitchen... Take 1 tablet by mouth once a day    Oxycodone-acetaminophen 5-325 Mg Tabs (Oxycodone-acetaminophen) .Marland Kitchen... Take 1 pill twice daily as needed for pain    Baclofen 10 Mg Tabs (Baclofen) .Marland Kitchen... Take 1 by mouth twice daily.    Oxycontin 10 Mg Xr12h-tab (Oxycodone hcl) .Marland Kitchen... Take 1 tab twice daily for long-acting pain relief  Orders: [facility]- Est Level  3 (16109)  Problem # 3:  CLAUDICATION, INTERMITTENT (ICD-443.9) Has long-term history of this.  Counseled to quit smoking.  Continue exercise.  Will continue to follow, may need referral for ABIs to pharm clinic .    Problem # 4:  CHRONIC KIDNEY DISEASE STAGE III (MODERATE) (ICD-585.3) recent bump in creatinine, recheck BMET today  Orders: Nephrology Referral (Nephro) [facility]- Est Level  3 (60454)  Complete Medication List: 1)  Aspirin Ec Lo-dose 81 Mg Tbec (Aspirin) .... Take 1 tablet by mouth once a day 2)  Humalog 100 Unit/ml Soln (Insulin lispro (human)) .... As needed 3)  Lantus 100  Unit/ml Soln (Insulin glargine) .... Inject 100 unit subcutaneously once a day 4)  Oxycodone-acetaminophen 5-325 Mg Tabs (Oxycodone-acetaminophen) .... Take 1 pill twice daily as needed for pain 5)  Simvastatin 80 Mg Tabs (Simvastatin) .... Take 1 tablet by mouth at bedtime 6)  Amlodipine Besylate 10 Mg Tabs (Amlodipine besylate) .... One tablet daily for blood pressure control 7)  Kayexalate Powd (Sodium polystyrene sulfonate) .Marland Kitchen.. 15 g po daily. 8)  Furosemide 40 Mg Tabs (Furosemide) .Marland Kitchen.. 1 tablet by mouth two times a day - per dr. Lacy Duverney 9)  Baclofen 10  Mg Tabs (Baclofen) .... Take 1 by mouth twice daily. 10)  Gani-tuss Dm Nr 100-10 Mg/52ml Liqd (Dextromethorphan-guaifenesin) .... Sig: take 1 tsp by mouth every 4 hours as needed for cough disp 1 bottle 11)  Oxycontin 10 Mg Xr12h-tab (Oxycodone hcl) .... Take 1 tab twice daily for long-acting pain relief  Other Orders: [facility] (04540)   Orders Added: 1)  [facility] [83036] 2)  Nephrology Referral [Nephro] 3)  [facility]- Est Level  3 [99213]    Laboratory Results   Blood Tests   Date/Time Received: June 17, 2010 10:11 AM  Date/Time Reported: June 17, 2010 10:49 AM   HGBA1C: 7.4%   (Normal Range: Non-Diabetic - 3-6%   Control Diabetic - 6-8%)  Comments: ...............test performed by......Marland KitchenBonnie A. [place], MLS (ASCP)cm

## 2010-07-11 NOTE — Assessment & Plan Note (Signed)
Summary: f/u hyperkalemia   Vital Signs:  Patient profile:   55 year old male Height:      71 inches Weight:      198.9 pounds BMI:     27.84 Temp:     97.8 degrees F oral Pulse rate:   96 / minute BP sitting:   134 / 83  (left arm) Cuff size:   large  Vitals Entered By: John [name], CMA (June 27, 2010 3:55 PM) CC: follow-up visit Is Patient Diabetic? Yes Did you bring your meter with you today? No Pain Assessment Patient in pain? no        Primary Provider:  Renold Don [name]  CC:  follow-up visit.  History of Present Illness: Pt returns today to follow up hyperkalemia.  Since he was last seen he has been taking his medications a prescribed, has had no palpitations, syncope, racing heart, chest pain, shortness of breath or muscle weakness/parylasis.  He does report that, since he changed his pain medication at his last visit with his PCP that he has been having more back pain and spasms related to the pain.  Preventive Screening-Counseling & Management  Alcohol-Tobacco     Smoking Status: current     Smoking Cessation Counseling: yes     Packs/Day: 0.5     Tobacco Counseling: to quit use of tobacco products  Allergies: No Known Drug Allergies  Past History:  Reviewed for relavance.  no changes since last visit.  Review of Systems       Per HPI, otherwise negative.  Physical Exam  General:  Vital signs reviewed. Well-developed, well-nourished patient in NAD.  Awake and cooperative  Heart:  Regular rate and rhythm without murmur, rub, or gallop.  Normal S1/S2    Impression & Recommendations:  Problem # 1:  HYPERKALEMIA (ICD-276.7) Will obtain BMET today, follow up with PCP in 7-10 days.  Think that patient likely lives with a mildly elevated potassium and will likely do fine with one in the 5.5-6 range.  Pt instructed to call or come to the ED for CP/palpitations/syncope/SOB.  Nephrology is aware of lab results and wishes to continue current  management.  Orders: [facility]- Est Level  2 (04540) Basic Met-[facility] ([address])  Complete Medication List: 1)  Aspirin Ec Lo-dose 81 Mg Tbec (Aspirin) .... Take 1 tablet by mouth once a day 2)  Humalog 100 Unit/ml Soln (Insulin lispro (human)) .... As needed 3)  Lantus 100 Unit/ml Soln (Insulin glargine) .... Inject 100 unit subcutaneously once a day 4)  Oxycodone-acetaminophen 5-325 Mg Tabs (Oxycodone-acetaminophen) .... Take 1 pill twice daily as needed for pain 5)  Simvastatin 80 Mg Tabs (Simvastatin) .... Take 1 tablet by mouth at bedtime 6)  Amlodipine Besylate 10 Mg Tabs (Amlodipine besylate) .... One tablet daily for blood pressure control 7)  Kayexalate Powd (Sodium polystyrene sulfonate) .Marland Kitchen.. 15 g po daily. 8)  Furosemide 40 Mg Tabs (Furosemide) .Marland Kitchen.. 1 tablet by mouth two times a day - per dr. Lacy Duverney 9)  Baclofen 10 Mg Tabs (Baclofen) .... Take 1 by mouth twice daily. 10)  Gani-tuss Dm Nr 100-10 Mg/85ml Liqd (Dextromethorphan-guaifenesin) .... Sig: take 1 tsp by mouth every 4 hours as needed for cough disp 1 bottle 11)  Oxycontin 10 Mg Xr12h-tab (Oxycodone hcl) .... Take 1 tab twice daily for long-acting pain relief  Patient Instructions: 1)  It was great to see you today!  I am glad to hear that you aren't having any problems. 2)  I want  to get another blood test today to monitor your potassium. 3)  I want you to see Dr. Gwendolyn [name] in 7-10 days.  I will make sure he knows that your medicine for your back pain doesn't seem to be working and that you need help with a handicap form.   Orders Added: 1)  [facility]- Est Level  2 [99212] 2)  [facility] [[address]]

## 2010-07-11 NOTE — Progress Notes (Signed)
  Phone Note Outgoing Call Call back at [facility] Phone [phone]   Call placed by: Jimmy Footman, CMA,  June 25, 2010 9:22 AM Call placed to: Patient Summary of Call: Spoke with pt and per dr. Gwendolyn Grant pt is to come in and have potassium and ekg done. Pt states that he will come right in for that

## 2010-07-11 NOTE — Progress Notes (Signed)
Summary: refill  Phone Note Refill Request Call back at Home Phone [phone] Message from:  Patient  Refills Requested: Medication #1:  PERCOCET 7.5-325 MG  TABS Take 1 tab by mouth every 6 hours for pain - DO NOT FILL BEFORE 03/20/10 Please call when ready -  tried to make appt but none available and cannot get in until Jan - pls advise  Initial call taken by: De Nurse,  May 16, 2010 9:07 AM  Follow-up for Phone Call        pt is now out Follow-up by: De Nurse,  May 20, 2010 9:56 AM  Additional Follow-up for Phone Call Additional follow up Details #1::        spoke with Dr. Gwendolyn Grant and he advises that he can have RX ready to pick up tomorrow by 1:00 PM. message left on voicemail for patient.  he will give him enough for a month then he must schedule appointment. Additional Follow-up by: Theresia Lo RN,  May 21, 2010 2:06 PM    New/Updated Medications: PERCOCET 7.5-325 MG  TABS (OXYCODONE-ACETAMINOPHEN) Take 1 tab by mouth every 6 hours for pain Prescriptions: PERCOCET 7.5-325 MG  TABS (OXYCODONE-ACETAMINOPHEN) Take 1 tab by mouth every 6 hours for pain  #120 x 0   Entered and Authorized by:   Renold Don MD   Signed by:   Renold Don MD on 05/22/2010   Method used:   Print then Give to Patient   RxID:   [id number] PERCOCET 7.5-325 MG  TABS (OXYCODONE-ACETAMINOPHEN) Take 1 tab by mouth every 6 hours for pain - DO NOT FILL BEFORE 03/20/10  #120 x 0   Entered and Authorized by:   Renold Don MD   Signed by:   Renold Don MD on 05/22/2010   Method used:   Print then Give to Patient   RxID:   [id number]   Denny Peon states that patient has picked up Rx. Eythan Jayne RN  May 22, 2010 2:08 PM

## 2010-07-11 NOTE — Consult Note (Signed)
Summary: [facility]   Imported By: Knox Royalty 07/04/2010 10:52:58  _____________________________________________________________________  External Attachment:    Type:   Image     Comment:   External Document

## 2010-07-11 NOTE — Miscellaneous (Signed)
Summary: RX  Prescriptions: FUROSEMIDE 40 MG TABS (FUROSEMIDE) 1 tablet by mouth two times a day - per Dr. Lacy Duverney  #30 x 3   Entered and Authorized by:   Renold Don MD   Signed by:   Renold Don MD on 12/18/2009   Method used:   Electronically to        [facility] [address] [phone]* (retail)       [address]       [address]       [place], [place]  [address]       Ph: [phone]       Fax: [phone]   RxID:   [id number] FUROSEMIDE 40 MG TABS (FUROSEMIDE) 1 tablet by mouth two times a day - per Dr. Lacy Duverney  #30 x 3   Entered by:   Renold Don MD   Authorized by:   Marland Kitchen Amish Mintzer TEAM-Maggie Dworkin   Signed by:   Renold Don MD on 12/18/2009   Method used:   Print then Give to Patient   RxID:   [id number]  Pt needs a refill of his fluid pills.  He said that he contacted the pharmacy on Wed, but they have still not heard back.  He uses [facility] on [address].  His phone number is [phone]. Bradly Bienenstock  December 14, 2009 9:46 AM  Prescripion filled, e-sent to [facility].  Renold Don MD  December 18, 2009 8:29 AM  Appended Document: RX called and left RX with Pharm and notified pt

## 2010-07-11 NOTE — Progress Notes (Signed)
Summary: Medication  Phone Note Call from Patient Call back at Home Phone [phone]   Reason for Call: Talk to Doctor Summary of Call: pt sts the pain medication prescribed is not working, wants to go back to percocet.  Initial call taken by: Knox Royalty,  June 27, 2010 4:26 PM  Follow-up for Phone Call        Will need appt to discuss this.  He already has one scheduled in a couple weeks.  Can discuss at that time.   Follow-up by: Renold Don MD,  July 03, 2010 10:27 AM

## 2010-07-17 ENCOUNTER — Ambulatory Visit (INDEPENDENT_AMBULATORY_CARE_PROVIDER_SITE_OTHER): Payer: MEDICARE | Admitting: Family Medicine

## 2010-07-17 ENCOUNTER — Encounter: Payer: Self-pay | Admitting: Family Medicine

## 2010-07-17 VITALS — BP 167/80 | HR 99 | Temp 98.7°F | Ht 68.75 in | Wt 205.7 lb

## 2010-07-17 DIAGNOSIS — E1165 Type 2 diabetes mellitus with hyperglycemia: Secondary | ICD-10-CM

## 2010-07-17 DIAGNOSIS — M549 Dorsalgia, unspecified: Secondary | ICD-10-CM

## 2010-07-17 DIAGNOSIS — N183 Chronic kidney disease, stage 3 unspecified: Secondary | ICD-10-CM

## 2010-07-17 DIAGNOSIS — E875 Hyperkalemia: Secondary | ICD-10-CM

## 2010-07-17 DIAGNOSIS — E118 Type 2 diabetes mellitus with unspecified complications: Secondary | ICD-10-CM

## 2010-07-17 DIAGNOSIS — IMO0002 Reserved for concepts with insufficient information to code with codable children: Secondary | ICD-10-CM

## 2010-07-17 LAB — BASIC METABOLIC PANEL
BUN: 35 mg/dL — ABNORMAL HIGH (ref 6–23)
CO2: 21 mEq/L (ref 19–32)
Chloride: 111 mEq/L (ref 96–112)
Creat: 2.05 mg/dL — ABNORMAL HIGH (ref 0.40–1.50)

## 2010-07-17 LAB — CONVERTED CEMR LAB
CO2: 21 meq/L (ref 19–32)
Calcium: 8.5 mg/dL (ref 8.4–10.5)
Creatinine, Ser: 2.05 mg/dL — ABNORMAL HIGH (ref 0.40–1.50)
Sodium: 144 meq/L (ref 135–145)

## 2010-07-17 MED ORDER — OXYCODONE HCL 20 MG PO TB12: 20.0000 mg | ORAL_TABLET | Freq: Two times a day (BID) | ORAL | Status: DC | PRN

## 2010-07-17 NOTE — Patient Instructions (Signed)
Take the Furosemide fluid pill once daily. Take the Oxycontin pain pill in the AM and PM. Take the Percocet up to every 6 hours if you need it.  Let us know how the pain pills are helping.   We will check your blood work today.

## 2010-07-17 NOTE — Letter (Signed)
Summary: Results Follow-up Letter  [facility]  [address]   [place], [place] [address]   Phone: [phone]  Fax: [phone]    07/08/2010  [address] [place], [place]  [address]  Dear Mr. Rager,  I have gotten back the results of the blood we drew at your last visit.  Your potassium is 5.9, which is a little high, but is in the same general range that it has been in.  As long as you do not start having problems with weakness, chest pain, palpitations, or shotness of breath I would like you to continue with the plan we discussed, namely, following up with your PCP and nephrologist as already scheduled.  Feel free to contact us with any questions.  Sincerely,  Majel Homer MD [facility]           Appended Document: Results Follow-up Letter mailed

## 2010-07-18 ENCOUNTER — Other Ambulatory Visit: Payer: MEDICARE

## 2010-07-18 ENCOUNTER — Telehealth: Payer: Self-pay | Admitting: Family Medicine

## 2010-07-18 DIAGNOSIS — E875 Hyperkalemia: Secondary | ICD-10-CM

## 2010-07-18 LAB — BASIC METABOLIC PANEL
BUN: 32 mg/dL — ABNORMAL HIGH (ref 6–23)
CO2: 21 mEq/L (ref 19–32)
Calcium: 8 mg/dL — ABNORMAL LOW (ref 8.4–10.5)
Creat: 2.13 mg/dL — ABNORMAL HIGH (ref 0.40–1.50)
Glucose, Bld: 315 mg/dL — ABNORMAL HIGH (ref 70–99)
Sodium: 142 mEq/L (ref 135–145)

## 2010-07-18 LAB — CONVERTED CEMR LAB
CO2: 21 meq/L (ref 19–32)
Chloride: 112 meq/L (ref 96–112)
Creatinine, Ser: 2.13 mg/dL — ABNORMAL HIGH (ref 0.40–1.50)
Sodium: 142 meq/L (ref 135–145)

## 2010-07-18 NOTE — Assessment & Plan Note (Signed)
Patient already has podiatrist.  Recommended he follow up in 1 month.  Patient agreed to plan.

## 2010-07-18 NOTE — Telephone Encounter (Signed)
Called and spoke with patient regarding Lab results.   Patient to take 1 Furosemide this AM, he took one last night. Taking NaHCO3 2 tabs po bid as prescribed by Nephrology.   Taking Kayexalate every other day instead of every day.  Discussed this needs to be everyday for now, likely twice daily. Patient to hang up and call immediately back to establish lab appt to recheck potassium today.  Patient expressed understanding and will call back immediately.

## 2010-07-18 NOTE — Assessment & Plan Note (Signed)
Recheck BMP today.  

## 2010-07-18 NOTE — Assessment & Plan Note (Signed)
Will need referral to Pharmacy clinic for management help with diabetes.   Recheck BMP to assess creatinine today.   Will restart Furosemide today which he has been off for 2 weeks due to concerns over worsening Creatinine, however will only add once daily instead of twice daily dosing.  This should help with Hyperkalemia as well.

## 2010-07-18 NOTE — Progress Notes (Signed)
  Subjective:    Patient ID: John [name], male    DOB: 01/17/1956, 55 y.o.   MRN: [id number]  HPI 1.  Back pain:  Patient with continued back pain.  Worse at night, waking him at night.  Feels that his pain was controlled on old Percocet QID regimen, however we switched him to oxycontin long acting about 3 weeks ago and he feels this isn't working.  Complainingmostly of hip and low back pain dull aching pain, occasionally becoming sharp pain.  No alleviating factors, including his prn Percocet.  Baclofen not helping.  No paresthesias, no change in bowel or bladder habits.    2.  Chronic kidney disease/lab abnormalities:  Has fu with [facility] who recommended increasing his sodium bicarbonate to twice daily.  Also recommended more strict HTN and DM control.  Per their note they were not "that excited" about potassium in 5.6 range.  No LE swelling.   No polyuria or  Oliguria.      Review of Systems No chest pain, palpitations, headaches, vision changes, abdominal pain or hematuria.       Objective:   Physical Exam Gen:  NAD, appears stated age.  Vital signs reviewed. Heart:  RRR without murmurs, S1/S2 good Lungs:  Clear to ausculation without wheezing or rales Abd:  Soft/ND/NT.  Good bowel sounds Ext:  Unable to palpate DP pulses BL extremities.  Toenails thickened, no hair below mid-shin.  Decreased sensation bilaterally to mid-shin. No edema or erythema noted, no ulcers or wounds.          Assessment & Plan:

## 2010-07-18 NOTE — Assessment & Plan Note (Signed)
No red flags.  Long-term chronic pain, likely exacerbated due to change in pain medications.   See Instructions. Will increase Oxycontin use.  Provided PRN Oxycodone as well.   Patient to fu in 1 month to follow improvement.   Recommended strengthening exercises and physical therapy.

## 2010-07-19 ENCOUNTER — Encounter: Payer: Self-pay | Admitting: Family Medicine

## 2010-07-25 NOTE — Assessment & Plan Note (Signed)
Summary: f/u eo  See Epic note.   Allergies: No Known Drug Allergies   Complete Medication List: 1)  Aspirin Ec Lo-dose 81 Mg Tbec (Aspirin) .... Take 1 tablet by mouth once a day 2)  Humalog 100 Unit/ml Soln (Insulin lispro (human)) .... As needed 3)  Lantus 100 Unit/ml Soln (Insulin glargine) .... Inject 100 unit subcutaneously once a day 4)  Oxycodone-acetaminophen 5-325 Mg Tabs (Oxycodone-acetaminophen) .... Take 1 pill twice daily as needed for pain 5)  Simvastatin 80 Mg Tabs (Simvastatin) .... Take 1 tablet by mouth at bedtime 6)  Amlodipine Besylate 10 Mg Tabs (Amlodipine besylate) .... One tablet daily for blood pressure control 7)  Kayexalate Powd (Sodium polystyrene sulfonate) .Marland Kitchen.. 15 g po every other day. 8)  Furosemide 40 Mg Tabs (Furosemide) .Marland Kitchen.. 1 tablet by mouth two times a day - per dr. Lacy Duverney 9)  Baclofen 10 Mg Tabs (Baclofen) .... Take 1 by mouth twice daily. 10)  Gani-tuss Dm Nr 100-10 Mg/19ml Liqd (Dextromethorphan-guaifenesin) .... Sig: take 1 tsp by mouth every 4 hours as needed for cough disp 1 bottle 11)  Oxycontin 10 Mg Xr12h-tab (Oxycodone hcl) .... Take 1 tab twice daily for long-acting pain relief 12)  Sodium Bicarbonate 650 Mg Tabs (Sodium bicarbonate) .... Take two pills two times a day

## 2010-07-26 ENCOUNTER — Other Ambulatory Visit: Payer: MEDICARE

## 2010-07-30 ENCOUNTER — Ambulatory Visit (INDEPENDENT_AMBULATORY_CARE_PROVIDER_SITE_OTHER): Payer: MEDICARE | Admitting: Family Medicine

## 2010-07-30 ENCOUNTER — Encounter: Payer: Self-pay | Admitting: Family Medicine

## 2010-07-30 DIAGNOSIS — M549 Dorsalgia, unspecified: Secondary | ICD-10-CM

## 2010-07-30 DIAGNOSIS — E875 Hyperkalemia: Secondary | ICD-10-CM

## 2010-07-30 DIAGNOSIS — J441 Chronic obstructive pulmonary disease with (acute) exacerbation: Secondary | ICD-10-CM | POA: Insufficient documentation

## 2010-07-30 DIAGNOSIS — N189 Chronic kidney disease, unspecified: Secondary | ICD-10-CM

## 2010-07-30 LAB — BASIC METABOLIC PANEL
CO2: 21 mEq/L (ref 19–32)
Chloride: 109 mEq/L (ref 96–112)
Potassium: 5.4 mEq/L — ABNORMAL HIGH (ref 3.5–5.3)
Sodium: 142 mEq/L (ref 135–145)

## 2010-07-30 LAB — CONVERTED CEMR LAB
BUN: 48 mg/dL — ABNORMAL HIGH (ref 6–23)
CO2: 21 meq/L (ref 19–32)
Chloride: 109 meq/L (ref 96–112)
Creatinine, Ser: 2.63 mg/dL — ABNORMAL HIGH (ref 0.40–1.50)
Potassium: 5.4 meq/L — ABNORMAL HIGH (ref 3.5–5.3)

## 2010-07-30 MED ORDER — PREDNISONE 20 MG PO TABS: 40.0000 mg | ORAL_TABLET | Freq: Every day | ORAL | Status: AC

## 2010-07-30 MED ORDER — DOXYCYCLINE HYCLATE 100 MG PO TBEC: 100.0000 mg | DELAYED_RELEASE_TABLET | Freq: Two times a day (BID) | ORAL | Status: AC

## 2010-07-30 MED ORDER — BENZONATATE 100 MG PO CAPS: 100.0000 mg | ORAL_CAPSULE | Freq: Three times a day (TID) | ORAL | Status: AC | PRN

## 2010-07-30 NOTE — Patient Instructions (Signed)
Take the cough syrup daily to help with the cough. You will get 2 inhalers:  The Albuterol is a short-term inhaler to use as needed.  Use it every 6 hours for the next 3 days scheduled, then as needed. The other will be a long-term inhaler to use every day. Take the Prednisone daily for 5 days.  Take the antibiotic daily for 5 days. We will check your blood today for kidney function and potassium.  Make an appointment in the next several weeks for follow-up with Pharmacy Clinic to do lung testing and diabetes. Come back and see me in 2 weeks so I know you're doing better.   We will let you know about your labs.

## 2010-07-31 NOTE — Progress Notes (Signed)
  Subjective:    Patient ID: John [name], male    DOB: [birth year], 55 y.o.   MRN: [id number]  HPI 1.  Cough:  Patient with persistent cough for past several days. Has had similar cough for past 6-8 months that comes and goes, lasts about 2-3 weeks at a time.  Describes cough as painful, persistent, waking him from night.  Productive of thick green sputum.  Coughing fits will occasionally last 30 seconds at a time, causing him to struggle to catch his breath.  Has tried OTC cough drops with intermittent relief.  No nasal congestion, sore throat, fevers.     Review of Systems The patient denies fever, weight loss, decreased hearing, chest pain, pre-syncopal or syncopal episodes, dyspnea on exertion,  melena, hematochezia, severe indigestion/heartburn, genital sores, muscle weakness, difficulty walking, unusual weight change, abnormal bleeding, and enlarged lymph nodes.       Objective:   Physical Exam  Constitutional: He appears well-developed and well-nourished.  HENT:  Head: Normocephalic and atraumatic.  Right Ear: External ear normal.  Left Ear: External ear normal.  Mouth/Throat: Oropharynx is clear and moist.  Eyes: Conjunctivae and EOM are normal. Pupils are equal, round, and reactive to light.  Neck: Normal range of motion. Neck supple.  Cardiovascular: Normal rate, regular rhythm and normal heart sounds.   Pulmonary/Chest: Effort normal. No respiratory distress. He has wheezes. He has no rales. He exhibits no tenderness.  Abdominal: He exhibits no distension. There is no tenderness.  Skin: Skin is warm and dry.          Assessment & Plan:

## 2010-08-01 NOTE — Assessment & Plan Note (Signed)
Much improved.  Creatinine increasing, but patient holding Lasix now.

## 2010-08-01 NOTE — Assessment & Plan Note (Signed)
Believe this is first exacerbation of COPD.  He meets criteria -- productive cough for past year, almost every month.   Discussed sending in inhalers for improvement, but patient states he would rather wait for formal testing.  I believe he needs long and short-acting bronchodilators, but will defer to patient request secondary to financial issues.   Will treat cough with Tessalon. Treat exac with doxy and prednisone.   Refer to pharm clinic, told patient to call and set up appt for PFTs.

## 2010-08-02 ENCOUNTER — Telehealth: Payer: Self-pay | Admitting: Family Medicine

## 2010-08-02 NOTE — Telephone Encounter (Signed)
Patient had question about CBG's elevated since taking prednisone which he is on day 3-4 of 5.  Patient missed his usual 100 units of lantus this morning because he ran out.  Now he took just took it and noted CBG reading was >500.  He has not taken his dinnertime meal coverage humalog (it is 7:20 pm).  Advised to take slightly higher humalog for his meal coverage now, and wait for lantus to take effect.  Advised to drink lots of fluids, and monitor blood sugar closely over next day he has left of prednisone.

## 2010-08-13 ENCOUNTER — Telehealth: Payer: Self-pay | Admitting: Family Medicine

## 2010-08-13 ENCOUNTER — Encounter: Payer: Self-pay | Admitting: Pharmacist

## 2010-08-13 ENCOUNTER — Ambulatory Visit (INDEPENDENT_AMBULATORY_CARE_PROVIDER_SITE_OTHER): Payer: MEDICARE | Admitting: Pharmacist

## 2010-08-13 DIAGNOSIS — E785 Hyperlipidemia, unspecified: Secondary | ICD-10-CM

## 2010-08-13 DIAGNOSIS — F17200 Nicotine dependence, unspecified, uncomplicated: Secondary | ICD-10-CM

## 2010-08-13 DIAGNOSIS — M549 Dorsalgia, unspecified: Secondary | ICD-10-CM

## 2010-08-13 DIAGNOSIS — E663 Overweight: Secondary | ICD-10-CM

## 2010-08-13 DIAGNOSIS — E118 Type 2 diabetes mellitus with unspecified complications: Secondary | ICD-10-CM

## 2010-08-13 DIAGNOSIS — E875 Hyperkalemia: Secondary | ICD-10-CM

## 2010-08-13 DIAGNOSIS — M1710 Unilateral primary osteoarthritis, unspecified knee: Secondary | ICD-10-CM

## 2010-08-13 DIAGNOSIS — I10 Essential (primary) hypertension: Secondary | ICD-10-CM

## 2010-08-13 DIAGNOSIS — E1149 Type 2 diabetes mellitus with other diabetic neurological complication: Secondary | ICD-10-CM

## 2010-08-13 DIAGNOSIS — Z8679 Personal history of other diseases of the circulatory system: Secondary | ICD-10-CM

## 2010-08-13 DIAGNOSIS — J441 Chronic obstructive pulmonary disease with (acute) exacerbation: Secondary | ICD-10-CM

## 2010-08-13 DIAGNOSIS — R269 Unspecified abnormalities of gait and mobility: Secondary | ICD-10-CM

## 2010-08-13 DIAGNOSIS — I739 Peripheral vascular disease, unspecified: Secondary | ICD-10-CM

## 2010-08-13 DIAGNOSIS — N529 Male erectile dysfunction, unspecified: Secondary | ICD-10-CM

## 2010-08-13 MED ORDER — INSULIN GLARGINE 100 UNIT/ML ~~LOC~~ SOLN: 30.0000 [IU] | Freq: Every day | SUBCUTANEOUS | Status: DC

## 2010-08-13 MED ORDER — INSULIN LISPRO 100 UNIT/ML ~~LOC~~ SOLN: 10.0000 [IU] | Freq: Two times a day (BID) | SUBCUTANEOUS | Status: DC

## 2010-08-13 MED ORDER — ALBUTEROL SULFATE (2.5 MG/3ML) 0.083% IN NEBU
2.5000 mg | INHALATION_SOLUTION | Freq: Once | RESPIRATORY_TRACT | Status: AC
  Administered 2010-08-13: 2.5 mg via RESPIRATORY_TRACT

## 2010-08-13 NOTE — Progress Notes (Signed)
  Subjective:    Patient ID: John [name], male    DOB: 1955-08-23, 55 y.o.   MRN: [id number]  HPI  Arrives , walking with assistance of a cane.  Patient reports ADLs limited due to pain.   RE diabetes:  Patient [name] he does NOT take Lantus daily and rarely takes Humalog.  Patient has cut down smoking to 10 cigs per day.    Review of Systems     Objective:   Physical Exam    CBG at home 147 this AM per patient report.     Assessment & Plan:

## 2010-08-13 NOTE — Assessment & Plan Note (Addendum)
Currently nonadherent with current insulin regimen.  Admits not taking humalog and only taking lantus 100 units every 3-4 days.   Reports checking his blood sugar 2-3 times daily.  States he experiences low blood sugars about 15 times per month.   Willing to make changes with insulin regimen.  Lantus decreased to 30 units q am AND asked him to take this every morning.   Changed Humalog to 10 units prior to lunch and dinner.   Patient verbalized understanding with this plan.  Total time in counseling: 35 minutes.  Seen with Orlan Leavens, PharmD Candidate, Tammy Sours, PharmD resident.

## 2010-08-13 NOTE — Assessment & Plan Note (Signed)
Chronic nicotine abuse of 0.5 ppd in previous 2 ppd smoker.  Not interested in quitting at this time.  Advised of importance of tobacco cessation.  Offered help when he is ready.

## 2010-08-13 NOTE — Telephone Encounter (Signed)
I am on clinic on 3/8.  I will refill his medications at that time.

## 2010-08-13 NOTE — Patient Instructions (Addendum)
1) Lung Function Test Today showed MODERATE RESTRICTION and lung age of 95 years.  2) Change Lantus to 30 units every day in the morning.  3) Change Humalog to 10 units prior to lunch and dinner.  4) Work on cutting down on your smoking! 5) Next visit with Dr. Gwendolyn Grant.

## 2010-08-13 NOTE — Telephone Encounter (Signed)
Patient needs a refill of his Percocet befor 08/17/10.

## 2010-08-13 NOTE — Assessment & Plan Note (Signed)
Moderate Restriction found on spirometry.  NO change with albuterol in patient with distant history of asthma (<56 years of age) and chronic tobacco use.   Patient educated on results and he verbalized understanding of results.

## 2010-08-15 ENCOUNTER — Telehealth: Payer: Self-pay | Admitting: Family Medicine

## 2010-08-15 DIAGNOSIS — M549 Dorsalgia, unspecified: Secondary | ICD-10-CM

## 2010-08-15 MED ORDER — OXYCODONE HCL 20 MG PO TB12: 20.0000 mg | ORAL_TABLET | Freq: Two times a day (BID) | ORAL | Status: DC | PRN

## 2010-08-15 MED ORDER — OXYCODONE-ACETAMINOPHEN 5-325 MG PO TABS: ORAL_TABLET | ORAL | Status: DC

## 2010-08-15 MED ORDER — OXYCODONE-ACETAMINOPHEN 7.5-325 MG PO TABS: 1.0000 | ORAL_TABLET | Freq: Four times a day (QID) | ORAL | Status: DC | PRN

## 2010-08-15 NOTE — Telephone Encounter (Signed)
Please review and refill

## 2010-08-15 NOTE — Progress Notes (Signed)
Addended byRenold Don on: 08/15/2010 05:09 PM   Modules accepted: Orders

## 2010-08-15 NOTE — Telephone Encounter (Signed)
Refilled and placed in To Be Called box.

## 2010-08-15 NOTE — Telephone Encounter (Signed)
Needs refill for percocet pls call when ready

## 2010-08-15 NOTE — Assessment & Plan Note (Signed)
Back pain:  Has not improved on new pain regiment of Oxycodone.  States he is waking up several times at night with pain.  Would like to go back on Percocet.  Will treat at previous dosage.

## 2010-08-16 NOTE — Assessment & Plan Note (Signed)
Pharmacist called that Dr Tyson Alias BNDD isn't going through. I reviewed the chart and allowed the use of my BNDD for the Percocet RX

## 2010-08-22 ENCOUNTER — Encounter: Payer: Self-pay | Admitting: Family Medicine

## 2010-08-22 ENCOUNTER — Ambulatory Visit (INDEPENDENT_AMBULATORY_CARE_PROVIDER_SITE_OTHER): Payer: Medicare Other | Admitting: Family Medicine

## 2010-08-22 VITALS — BP 130/60 | HR 100 | Temp 98.6°F | Wt 202.0 lb

## 2010-08-22 DIAGNOSIS — J441 Chronic obstructive pulmonary disease with (acute) exacerbation: Secondary | ICD-10-CM

## 2010-08-22 LAB — BASIC METABOLIC PANEL
CO2: 21 mEq/L (ref 19–32)
Chloride: 109 mEq/L (ref 96–112)
Creat: 2.32 mg/dL — ABNORMAL HIGH (ref 0.40–1.50)

## 2010-08-22 LAB — CONVERTED CEMR LAB
BUN: 40 mg/dL — ABNORMAL HIGH (ref 6–23)
CO2: 21 meq/L (ref 19–32)
Chloride: 109 meq/L (ref 96–112)
Creatinine, Ser: 2.32 mg/dL — ABNORMAL HIGH (ref 0.40–1.50)

## 2010-08-22 NOTE — Patient Instructions (Addendum)
Cut back to daily Kayexalate, just one scoop like you were taking before.  Keep taking the fluid pill daily. Make a lab appt in 2 weeks to check your potassium.   We'll check your kidney function today.   We'll send in cough medicine and antibiotic and inhaler.   Use it every 4-6 hours if you need it for short of breath.

## 2010-08-22 NOTE — Assessment & Plan Note (Addendum)
On review of formulary, Albuterol is not covered for this patient. Will call and discuss with him tomorrow regarding [facility]erol and hopefully Atrovent as well.   Obviously some anticholingeric such as Spiriva or Atrovent will be helpful. Would like to hold off on antibiotics for now, no fevers, no focal areas to treat on exam.   In the meantime, would rather hold off on oral steroids due to uncontrolled sugars as side effect.   Will call in cough syrup to [facility].

## 2010-08-24 NOTE — Assessment & Plan Note (Addendum)
Not on an ACE. Plan to check BMET today. Continue Lasix, sodium bicarb. Will decrease kayexalate to prior daily dose to combat diarrhea.   Will re-check potassium in 2 weeks after decreasing kayexalate dose. Being followed at [facility].  Encouraged patient to keep these appointments.

## 2010-08-24 NOTE — Progress Notes (Signed)
  Subjective:    Patient ID: John [name], male    DOB: 1955/10/10, 55 y.o.   MRN: [id number]  HPI 1.  Cough:  Improved s/p steroids and Abx.  However, worsening again.  Describes cough productive of thick greenish sputum which awakens him from sleep.  Present on most days.  Diagnosed as COPD exac last visit.  Seen at [facility] clinic, PFTs showed non-reversible decreased spirometry, consistent with COPD.  Patient unable to afford inhalers last visit.  No fevers, chills, chest pain.    2.  CKD:  Patient with recent history of hyperkalemia without EKG changes and rising creatinine.  Taking lasix daily, sodium bicarbonate, Kayexalate.  Has had some trouble since increasing Kayexalate dose with diarrhea.  Does endorse some minimal LE swelling at this visit.  No shortness of breath or chest pain.  No N/V/abdominal pain.     Review of Systems See HPI above for review of systems.       Objective:   Physical Exam Gen:  Alert, cooperative patient who appears stated age in no acute distress.  Vital signs reviewed. Cardiac:  Regular rate and rhythm without murmur auscultated.  Good S1/S2. Lungs:  Wheezing present bibasilar lung fields.  No areas of consolidation, no crackles Abd:  Soft/nondistended/nontender.  Good bowel sounds throughout all four quadrants.  No masses noted.  Ext:  +1 edema noted ankles BL LE's.         Assessment & Plan:

## 2010-08-26 ENCOUNTER — Encounter: Payer: Self-pay | Admitting: Family Medicine

## 2010-08-26 ENCOUNTER — Ambulatory Visit (INDEPENDENT_AMBULATORY_CARE_PROVIDER_SITE_OTHER): Payer: Medicare Other | Admitting: Family Medicine

## 2010-08-26 DIAGNOSIS — J441 Chronic obstructive pulmonary disease with (acute) exacerbation: Secondary | ICD-10-CM

## 2010-08-26 DIAGNOSIS — F17200 Nicotine dependence, unspecified, uncomplicated: Secondary | ICD-10-CM

## 2010-08-26 MED ORDER — TIOTROPIUM BROMIDE MONOHYDRATE 18 MCG IN CAPS: 18.0000 ug | ORAL_CAPSULE | Freq: Every day | RESPIRATORY_TRACT | Status: DC

## 2010-08-26 MED ORDER — DOXYCYCLINE HYCLATE 100 MG PO TBEC: 100.0000 mg | DELAYED_RELEASE_TABLET | Freq: Two times a day (BID) | ORAL | Status: AC

## 2010-08-26 MED ORDER — ALBUTEROL SULFATE HFA 108 (90 BASE) MCG/ACT IN AERS: 2.0000 | INHALATION_SPRAY | Freq: Four times a day (QID) | RESPIRATORY_TRACT | Status: DC | PRN

## 2010-08-26 NOTE — Progress Notes (Signed)
  Subjective:    Patient ID: John [name], male    DOB: [birth year], 55 y.o.   MRN: [id number]  Cough This is a new problem. The current episode started 1 to 4 weeks ago. The problem has been gradually worsening. The problem occurs every few minutes. The cough is productive of sputum. Associated symptoms include postnasal drip, shortness of breath and wheezing. Pertinent negatives include no chills, fever or nasal congestion. Risk factors for lung disease include smoking/tobacco exposure. He has tried rest, oral steroids and OTC cough suppressant for the symptoms. The treatment provided mild relief. His past medical history is significant for COPD.      Review of Systems  Constitutional: Negative for fever and chills.  HENT: Positive for postnasal drip.   Respiratory: Positive for cough, shortness of breath and wheezing.        Objective:   Physical Exam  Constitutional: He appears well-developed and well-nourished.  Cardiovascular: Normal rate and regular rhythm.   Pulmonary/Chest: Effort normal. He has wheezes.  Skin: Skin is warm and dry. Nails show clubbing.          Assessment & Plan:

## 2010-08-26 NOTE — Assessment & Plan Note (Signed)
Advised quitting smoking  

## 2010-08-26 NOTE — Assessment & Plan Note (Signed)
Will rx with Antibiotic and inhaler, per pt. And wife today---has not tried inhalers.

## 2010-08-26 NOTE — Assessment & Plan Note (Signed)
Would like labs forwarded to [facility]

## 2010-08-28 ENCOUNTER — Telehealth: Payer: Self-pay | Admitting: Family Medicine

## 2010-08-28 DIAGNOSIS — R05 Cough: Secondary | ICD-10-CM

## 2010-08-28 MED ORDER — BENZONATATE 100 MG PO CAPS: 100.0000 mg | ORAL_CAPSULE | Freq: Three times a day (TID) | ORAL | Status: DC | PRN

## 2010-08-28 NOTE — Telephone Encounter (Signed)
Pt had rx called in for his cough but ins does not cover it, wants something his ins will cover, pt has medicare complete & the orange card.

## 2010-08-28 NOTE — Telephone Encounter (Signed)
Contacted Dr. Shawnie Pons and she advises may send in RX for tessalon perles 100 mg three times daily as needed for cough. Patient notified.

## 2010-08-28 NOTE — Telephone Encounter (Signed)
Checking status of rx for cough, was just seen by Dr. Shawnie Pons & cough med was suppose to be sent to pharmacy.

## 2010-08-29 MED ORDER — GUAIFENESIN-CODEINE 100-10 MG/5ML PO SYRP: 5.0000 mL | ORAL_SOLUTION | Freq: Two times a day (BID) | ORAL | Status: DC | PRN

## 2010-08-29 NOTE — Telephone Encounter (Signed)
Called in cough syrup presciption to patient's pharmacy.  Called and informed patient.  He understands and will pick it up later this afternoon.

## 2010-09-05 ENCOUNTER — Ambulatory Visit: Payer: Medicare Other | Admitting: Family Medicine

## 2010-09-10 ENCOUNTER — Other Ambulatory Visit: Payer: Medicare Other

## 2010-09-10 DIAGNOSIS — N183 Chronic kidney disease, stage 3 unspecified: Secondary | ICD-10-CM

## 2010-09-10 DIAGNOSIS — I10 Essential (primary) hypertension: Secondary | ICD-10-CM

## 2010-09-10 DIAGNOSIS — E875 Hyperkalemia: Secondary | ICD-10-CM

## 2010-09-10 LAB — BASIC METABOLIC PANEL
Chloride: 106 mEq/L (ref 96–112)
Potassium: 5.6 mEq/L — ABNORMAL HIGH (ref 3.5–5.3)
Sodium: 142 mEq/L (ref 135–145)

## 2010-09-10 NOTE — Progress Notes (Signed)
Drew BMP BAJORDAN, MLS 

## 2010-09-12 ENCOUNTER — Ambulatory Visit (HOSPITAL_COMMUNITY)
Admission: RE | Admit: 2010-09-12 | Discharge: 2010-09-12 | Disposition: A | Discharge status: Home / Self Care | Payer: Medicare Other | Source: Ambulatory Visit | Attending: Family Medicine | Admitting: Family Medicine

## 2010-09-12 ENCOUNTER — Encounter: Payer: Self-pay | Admitting: Family Medicine

## 2010-09-12 ENCOUNTER — Ambulatory Visit (INDEPENDENT_AMBULATORY_CARE_PROVIDER_SITE_OTHER): Payer: Medicare Other | Admitting: Family Medicine

## 2010-09-12 VITALS — BP 158/80 | HR 106 | Temp 98.2°F | Ht 71.0 in | Wt 194.1 lb

## 2010-09-12 DIAGNOSIS — J441 Chronic obstructive pulmonary disease with (acute) exacerbation: Secondary | ICD-10-CM

## 2010-09-12 DIAGNOSIS — E785 Hyperlipidemia, unspecified: Secondary | ICD-10-CM

## 2010-09-12 DIAGNOSIS — E118 Type 2 diabetes mellitus with unspecified complications: Secondary | ICD-10-CM

## 2010-09-12 DIAGNOSIS — M549 Dorsalgia, unspecified: Secondary | ICD-10-CM

## 2010-09-12 DIAGNOSIS — R0602 Shortness of breath: Secondary | ICD-10-CM | POA: Insufficient documentation

## 2010-09-12 DIAGNOSIS — R059 Cough, unspecified: Secondary | ICD-10-CM | POA: Insufficient documentation

## 2010-09-12 DIAGNOSIS — R05 Cough: Secondary | ICD-10-CM | POA: Insufficient documentation

## 2010-09-12 LAB — POCT GLYCOSYLATED HEMOGLOBIN (HGB A1C): Hemoglobin A1C: 7.9

## 2010-09-12 MED ORDER — OXYCODONE-ACETAMINOPHEN 7.5-325 MG PO TABS: 1.0000 | ORAL_TABLET | Freq: Four times a day (QID) | ORAL | Status: DC | PRN

## 2010-09-12 MED ORDER — SIMVASTATIN 80 MG PO TABS: 80.0000 mg | ORAL_TABLET | Freq: Every day | ORAL | Status: DC

## 2010-09-12 MED ORDER — DOXYCYCLINE HYCLATE 100 MG PO CAPS: 100.0000 mg | ORAL_CAPSULE | Freq: Two times a day (BID) | ORAL | Status: AC

## 2010-09-12 MED ORDER — BENZONATATE 100 MG PO CAPS: 100.0000 mg | ORAL_CAPSULE | Freq: Three times a day (TID) | ORAL | Status: AC | PRN

## 2010-09-12 NOTE — Assessment & Plan Note (Signed)
Needs lipid panel at next visit.   Refilled for 1 month, fu in 1 month.

## 2010-09-12 NOTE — Assessment & Plan Note (Addendum)
Will re-treat with Doxycycline.  Hold off on Prednisone b/c of diabetic and blood sugar controls.  Continue Spiriva and Albuterol.  Consider stepping up treatment to Advair or another long-term corticosteroid + LABA.   If no acute improvement, will add back short course Prednisone burst.   Discussed for about 35 minutes with patient exactly what COPD is and prognosis.  Explained that patient is not actively dying as they feared. Counseled to stop smoking.  To FU in 1 month.

## 2010-09-12 NOTE — Progress Notes (Signed)
  Subjective:    Patient ID: John [name], male    DOB: 1956/02/15, 55 y.o.   MRN: [id number]  HPI 1.  COPD:  Patient returns today to discuss COPD diagnosis.  In particular, he and his wife are very alarmed about being told his lungs were that of a "11 year old man."  Wife asks if they should get his will and papers in order and if he is about to die.  In short, patient diagnosed by myself 2 months ago with COPD due to chronic cough with sputum production for more months of the year than not, ever since I have met him.  Treated for acute exacerbation 2 months ago.  Started on Spiriva and PRN albuterol at that time as well  Has felt some improvement from this.  Cough persists, though it initially improved with doxycycline and prednisone, now acutely worsening again.  Still with some sputum production.  No fevers, chills, nausea, abdominal pain, chest pain, dyspnea.   2. HLD:  Has not been checked for lipid panel in over a year.  As patient has worsening of CKD, will need to address lipid management although he is already on 80 mg simvastatin.  Asking for refill.  No liver abnormalities on exam.     Review of Systems     Objective:   Physical Exam Gen:  Alert, cooperative patient who appears stated age in no acute distress.  Vital signs reviewed. Pulm:  Clear to auscultation bilaterally with good air movement.  No wheezes or rales noted.   Cardiac:  Regular rate and rhythm without murmur auscultated.  Good S1/S2.        Assessment & Plan:

## 2010-09-12 NOTE — Patient Instructions (Addendum)
Keep using the Spiriva daily as prescribed Only use the Albuterol as a rescue inhaler when you need it.  If you're feeling good, you don't have to use it. Take the Tessalon perles for the cough. Take the antibiotic twice daily for the next 7 days. Get the chest xray and come back and see me in 1 month.  Let's see how you're doing then.     Chronic Obstructive Pulmonary Disease (COPD) Chronic obstructive pulmonary disease (COPD) is a lasting (permanent) lung disease. The lungs become damaged, making it hard to get air in and out of your lungs. The damage to your lungs cannot be changed. COPD is caused by long-term smoking or breathing in toxins. Symptoms can include shortness of breath, coughing, and wheezing. Your doctor will treat your breathing to help you feel better. HOME CARE  If you smoke, stop smoking. Smoking makes it hard for your body to get oxygen and causes more damage to your lungs. Avoid secondhand smoke.   Drink enough water and fluids to keep your urine clear or pale yellow. This helps to loosen any thick mucus (phlegm) you may have.   Use a humidifier or vaporizer. This may help loosen the thick mucus.   Use home oxygen as told by your doctor.   Take your medicine as told by your doctor.   Talk to your doctor about using cough syrup or other over-the-counter medicines.   Talk to your doctor about vaccines that can help prevent other lung problems. There are vaccines for pneumonia and the flu (influenza). It is important that you avoid getting illnesses that affect your lungs. These could cause serious problems.  GET HELP IF:  You cough up thick mucus that is red, yellow, or green AND this is a change for you.   You have a temperature by mouth above 101.   Your breathing is not as good as you think it should be.   Your breathing becomes worse when you walk or exercise.   You are running out of the medicine you take for your breathing.  GET HELP RIGHT AWAY  IF:  Your heart is beating fast or is skipping beats.   You become confused, very weak, or pass out (faint).   It is very hard to breathe or to catch your breath.   You get chest pain or pressure.   You have a temperature by mouth above 101, not controlled by medicine.  MAKE SURE YOU:    Understand these instructions.   Will watch your condition.   Will get help right away if you are not doing well or get worse.  Document Released: 11/12/2007 Document Re-Released: 08/20/2009 [facility] Patient Information 2011 [place], [place].

## 2010-09-15 LAB — GLUCOSE, CAPILLARY: Glucose-Capillary: 237 mg/dL — ABNORMAL HIGH (ref 70–99)

## 2010-09-16 ENCOUNTER — Encounter: Payer: Self-pay | Admitting: Family Medicine

## 2010-10-14 ENCOUNTER — Telehealth: Payer: Self-pay | Admitting: Family Medicine

## 2010-10-14 DIAGNOSIS — M549 Dorsalgia, unspecified: Secondary | ICD-10-CM

## 2010-10-14 NOTE — Telephone Encounter (Signed)
Patient needs refill of Percocet.  He only has 11/2 days of meds left.

## 2010-10-15 NOTE — Telephone Encounter (Signed)
Pt called again to check on meds.

## 2010-10-17 MED ORDER — OXYCODONE-ACETAMINOPHEN 7.5-325 MG PO TABS: 1.0000 | ORAL_TABLET | Freq: Four times a day (QID) | ORAL | Status: DC | PRN

## 2010-10-17 NOTE — Telephone Encounter (Signed)
Filled and placed in "to be called" box.

## 2010-10-22 NOTE — Assessment & Plan Note (Signed)
Mr. Brostrom returns today.  I last saw him on 09/07/2007.  Since I last  saw him, he had a fall on 09/13/2007.  He has had increased bilateral  calf pain as well as foot pain.  He has had x-rays, left foot, which  showed some small vessel arterial calcification.  He has had a right  tibia/fibula two views, which were normal and right ankle films, which  were normal.   He has had some increased back pain.   He had no new problems in terms of bowel or bladder.  He has problems  once he is walking with his calf.  He needs to lean over his walker to  relieve symptoms.   He has had prior lumbar MRIs in 2007, which were unremarkable.  No  evidence of stenosis.  He does have a history of cervical myelopathy,  but he did not injure his neck with the current fall.   PHYSICAL EXAMINATION:  GENERAL:  In no acute distress.  Mood and affect  appropriate.  His gait shows no evidence of scissoring.  He does use a  walker.  BACK:  Mild tenderness to palpation lumbosacral junction.  His tone is  normal in the hip adductors.  Lower extremity strength of 4 minus on the  left side and 5 on the right side.  He has decreased right L5 sensation.  His pulses are thready bilateral posterior tibial and pedal; however,  his skin temperature is good.   IMPRESSION:  1. 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  2. Fall with increased back and lower extremity pain persisting 10      days post fall, accompanied by some right lower extremity numbness      that I did not appreciate before.   PLAN:  Differential includes lumbar herniated disk with spinal nerve  compression L5-S1 region versus vascular problem, although I would not  think a vascular problem would acutely worsen post fall.  1. Will check MRI.  2. Continue Percocet.  3. Continue baclofen 5 p.o. b.i.d.  4. Trial Lyrica 75 b.i.d.  He does have a history of diabetic      neuropathy, but once again I would not think trauma with  exacerbate      this.   I will see him back after the MRI and decide to proceed from there with  vascular studies if unremarkable.      [name], M.D.  Electronically Signed     AEK/MedQ  D:  09/23/2007 14:04:57  T:  09/23/2007 14:21:52  Job #:  [id number]   cc:   Casimiro Needle L. Thad Ranger, M.D.  Fax: [phone]

## 2010-10-22 NOTE — Procedures (Signed)
NAME:  SUTTON, HIRSCH NO.:  [id number]   MEDICAL RECORD NO.:  [id number]          PATIENT TYPE:  REC   LOCATION:  [facility]                          FACILITY:  [facility]   PHYSICIAN:  [name], M.D.DATE OF BIRTH:  January 30, 1956   DATE OF PROCEDURE:  03/30/2008  DATE OF DISCHARGE:                               OPERATIVE REPORT   Mr. Redwine follows up today.  He was last seen by me on March 16, 2008.  He has had a right-sided radiofrequency neurotomy at L5, L4, and L3.  He  has had improvement of about 50% of his pain in that particular region.  He has neural complaints of increasing jumping in bilateral shoulders  and legs.  He also has left greater than right shoulder pain.  He  described that his pain is burning, stabbing, and aching and he rates  around 9/10 in his shoulder area.  His back really has not colored in  with his current pain diagram.  Pain is worse with walking, bending, and  standing; improves with rest and medications, TENS.  He can walk 2-3  minutes at times.  He climbs steps.  He drives.  He uses cane to  ambulate.  He needs some assistance in meal prep, household duties, and  shopping.   REVIEW OF SYSTEMS:  Positive numbness, spasms, and chills, but no fever.   His blood pressure is 134/65, pulse 87, respirations 18, and O2 sat 97%  on room air.   His Oswestry disability score currently 62%.   PHYSICAL EXAMINATION:  Shows a positive impingement sign of left  shoulder and right shoulder negative impingement sign.  He has 4/5  strength bilateral in deltoid biceps groove as well as flexion/extension  and dorsiflexor.  He has no hip abductor spasticity.  His hip, internal  and external rotation is good.  His back has no tenderness to palpation.  His ankles are without clonus.  Deep tendon reflexes are 2+ bilateral  biceps, triceps, brachial radialis, knee, and ankle.   IMPRESSION:  1. History of cervical myelopathy with increasing uncontrolled  muscle      activity sounds like myoclonic jerks.  He has had some overall      weakness as well, question whether he may be developing syrinx.  He      does have a history of spinal cord injury.  2. Left shoulder pain, probable impingement syndrome,      diagnostic/therapeutic subacromial bursa injection.  3. Lumbar spondylosis without myelopathy, improvement after      radiofrequency procedures, really not a significant pain generator      at this time.   PLAN:  1. As noted above, we will inject shoulders.  2. C-spine MRI with and without contrast.  3. Continue Percocet 7.5/325 t.i.d. actually using it for some of his      other pain complaints rather than back at the      current time.  4. Continue  Baclofen 10 mg b.i.d. may need to increase.       [name], M.D.  Electronically Signed  AEK/MEDQ  D:  03/30/2008 16:34:40  T:  03/31/2008 02:39:53  Job:  [id number]

## 2010-10-22 NOTE — Assessment & Plan Note (Signed)
OFFICE VISIT   John [name], John [name]  DOB:  1956-02-15                                       11/01/2008  XBJYN#:[id number]   The patient is a 55 year old male previously referred by Dr. Claudette Laws for claudication symptoms.  He returns for followup today.  He  states that his left leg claudication symptoms have become worse.  Unfortunately, he continues to smoke one pack of cigarettes daily.  I  spent approximately 3 minutes today counseling him on smoking cessation,  the risk of limb loss long-term and other cardiovascular risk factors  associated with this.  His claudication symptoms currently occur at one-  half to one block.  He previously had an ulceration on the right foot,  and this was able to heal spontaneously.   He also has a history of diabetes, hypertension and elevated  cholesterol.  He also a history of neuropathy.   PHYSICAL EXAMINATION:  Blood pressure 152/77 in the right arm, pulse is  96 and regular.  He has 2+ femoral pulses bilaterally with absent  popliteal and pedal pulses.  He has 2+ brachial and 1+ radial pulses  bilaterally.  Left first toe is completely healed at this point.  Right  foot has no significant ulcerations.  Recent ABIs dated Oct 10, 2008 were  0.47 on the right, 0.51 on the left.  These are slightly decreased from  previous ABIs of 0.6 bilaterally in March 2010.   I discussed with the patient the possibility of arteriography and  intervention for his lower extremity symptoms.  However, he currently  wishes to continue with conservative management for now.  He has a  compromise to this.  He has decided to shorten his followup time.  We  will see him back in four months' time for repeat ABIs.  He will return  sooner if he wishes to consider an intervention regarding this.  I  discussed with him that currently he is not at risk of limb loss.  However, he should continue to protect his feet carefully and if he has  any  nonhealing ulcerations to return for follow up sooner.   Janetta Hora. Fields, MD  Electronically Signed   CEF/MEDQ  D:  11/02/2008  T:  11/02/2008  Job:  [id number]   cc:   [name], M.D.

## 2010-10-22 NOTE — Assessment & Plan Note (Signed)
A 55 year old male with cervical myopathy, spinal cord injury,  spasticity related to a fall in December of 2005. He is status post C3-  4, C4-5 diskectomy and fusion. I last saw him November 10, 2006. In the  interval time, he has been starting some weight lifting. He has a 15-  pound dumbbell which he cannot lift over his head, but he uses 2 hands  to lift over his head, and he does this approximately twice a day. In  addition, he does do some biceps curls with this as well. He has  developed some increasing neck and shoulder pain, however.   Also since I last saw him, he has had some liver function tests which  showed normal ALT, AST and this is reassuring given that he is on  chronic Zanaflex.   He has had improving gait. He has now advanced himself to a straight  cane rather than a walker. He does not go up and down steps except for  occasionally and just a few steps at a time. He still needs some  assistance with daily meal prep, household duties and shopping.   His review of systems:  Some numbness and tingling in the hands and feet  which is chronic, bladder and bowel control related to spinal cord  injury and depression but no suicidal thoughts. He has some lower  extremity swelling, some weight gain and blood sugar problems related to  his diabetes. He continues to follow with family practice, Dr. Providence Lanius,  although he thinks she may have graduated from her residency and may  have a new resident.   SOCIAL HISTORY:  Married. Lives with his wife. Still smokes a half pack  a day, would like to quit, and I instructed him to talk to his family  practice doctor on this note.   EXAMINATION:  Blood pressure 168/81, pulse 96, respiratory rate 18, and  O2 saturation 99% on room air.  Neck has 75% range in forward flexion and extension, 100% in terms of  lateral rotation. He has full strength on the right side. On the left  side, he has 4+ strength in biceps and triceps grip and knee  extensor  ankle dorsi flexor. He has no evidence of clonus in bilateral upper and  lower extremities. He has 3+ deep tendon reflexes bilateral patellae,  bilateral biceps, triceps.   He has tenderness in bilateral upper traps. He has also positive  impingement sign bilateral shoulders.   His gait shows slight toe drag on the left side compared to the right  side. He has no evidence of knee instability. He is stiff when he first  stands up but then straightens out. He has no evidence of abductor  spasticity.   1. Spastic gastroparesis, incomplete, due to cervical cord injury,      overall doing quite well, improving in terms of his gait.  2. Shoulder impingement, probably subacromial bursitis.  3. Myofascial pain, bilateral upper traps.   We discussed treatment options in terms of his shoulder and trap pain  including trigger point injections versus subacromial injections as well  as modifying his exercise program. We decided that he would first do not  overhead lifting with his weights and see how he does when he comes back  in 2 months, and if this pain in the shoulder and neck progresses, would  do trigger points plus physical therapy.   In terms of his overall neck pain, will continue his Percocet 7.5  t.i.d.,  and in terms of his spasticity, this is relatively well  controlled on Zanaflex 2 t.i.d. and 4 every night.      [name], M.D.  Electronically Signed     AEK/MedQ  D:  02/02/2007 09:51:46  T:  02/02/2007 15:09:04  Job #:  [id number]   cc:   Devra Dopp, MD   Donalee Citrin, M.D.  Fax: [phone]

## 2010-10-22 NOTE — Procedures (Signed)
NAME:  John [name], NAUERT NO.:  [id number]   MEDICAL RECORD NO.:  [id number]          PATIENT TYPE:  REC   LOCATION:  [facility]                          FACILITY:  [facility]   PHYSICIAN:  [name], M.D.DATE OF BIRTH:  02/14/1956   DATE OF PROCEDURE:  DATE OF DISCHARGE:                               OPERATIVE REPORT   PROCEDURES:  1. Right L5 dorsal ramus radiofrequency neurotomy.  2. Right L4 medial branch radiofrequency neurotomy.  3. Right L3 medial branch radiofrequency neurotomy.   INDICATIONS:  Lumbar spondylosis without myelopathy.  Pain is only  temporarily relieved by medial branch blocks at corresponding levels.  He has had improvement of left-sided pain status post left-sided  radiofrequency procedure at corresponding levels done on February 17, 2008.  He is here for the right side now.  His Oswestry disability  questionnaire was performed today, 62%.   Informed consent was obtained after describing the risks and benefits of  the procedure with the patient.  These include bleeding, bruising,  infection.  He elects to proceed and has given a written consent.  The  patient was placed prone on fluoroscopy table.  Betadine prep, sterile  drape, 25-gauge inch and half needle was used to anesthetize the skin  and subcu tissues, 1% lidocaine x2 mL, each at 3 sites.  Then, a 20-  gauge 10-cm RF needle was inserted under fluoroscopic guidance.  It had  8-10-mm curved active tip.  First starting in the right S1 SAP sacral  ala junction, bone contact made, and confirmed with lateral imaging.  Sensory stem at 50 Hz followed by motor stem at 2 Hz, confirmed proper  needle location followed by injection of 0.5 mL of solution containing 1  mL of 4 mg/mL of dexamethasone and 1 mL of 2% MPF lidocaine followed by  radiofrequency lesioning at 70 degrees Celsius for 70 seconds.  Then,  the right L5 SAP transverse process junction targeted, bone contact  made, confirmed  by lateral imaging.  Sensory stem at 50 Hz followed by  motor stem at 2 Hz confirmed proper needle location followed by  injection of 1.5 mL of the dexamethasone-lidocaine solution and  radiofrequency lesioning at 70 degrees Celsius for 70 seconds.  Then,  the right L4 SAP transverse process junction targeted, bone contact  made, confirmed with lateral imaging.  Sensory stem at 50 Hz followed by  motor stem at 2 Hz confirmed proper needle location followed by  injection with 0.5 mL of the dexamethasone-lidocaine solution and  radiofrequency lesioning at 70 degrees Celsius for 70 seconds.  The  patient tolerated the procedure well.  Pre-and post-injection vitals  stable.  Post injection instructions given.  Return in 1 month followup.  If continued feeling of weakness and gait problems, would go ahead and  reinstitute some physical therapy.      [name], M.D.  Electronically Signed     AEK/MEDQ  D:  03/16/2008 09:10:10  T:  03/16/2008 12:24:38  Job:  [id number]

## 2010-10-22 NOTE — Assessment & Plan Note (Signed)
John [name] follows up today.  I last saw him on March 30, 2008.  At  that time, he was complaining of increased uncontrolled muscle activity,  which sounded like myoclonic jerks, and he had some increased overall  weakness.  He does have a history of spinal cord injury, and because of  this, I sent him for a cervical MRI to assess for syringomyelia.  Fortunately, his cervical MRI did not show any syringomyelia.  It did  show some cervical foraminal stenosis at C6-C7, which would not explain  his symptoms.   He responded well to a left shoulder injection with corticosteroid last  month.   His back pain is doing pretty well status post radiofrequency procedures  performed on February 17, 2008 and March 16, 2008.   CURRENT MEDICATIONS:  1. Percocet 7.5/325 t.i.d.  He is not showing any signs of aberrant      drug behavior using this.  2. Baclofen 10 mg p.o. b.i.d.   PHYSICAL EXAMINATION:  Negative impingement sign in the shoulders  bilaterally.  He has 4/5 strength bilateral deltoid, biceps, and  triceps.  His right lower extremity strength is 5/5 and left lower  extremity strength is 4/5.  He has no evidence of hip abductor  spasticity.  He has no evidence of clonus.  No evidence of myoclonic  jerks.  Deep tendon reflexes are 2+ bilateral biceps, triceps,  brachioradialis, knee, and ankle.  Gait is using a cane, widen base for  support.  His Romberg is negative.   IMPRESSION:  Cervical myelopathy.  I believe that his balance disorder  is likely multifactorial.  Certainly, he has some mild weakness in the  lower extremities and impaired somatosensory system.   PLAN:  1. We will go ahead and send him to physical therapy 2 times a week      for 3-4 weeks, balance program plus home exercise program.  Ask him      also to do some TFL stretching as he has some nocturnal hip pain      when he lays on the left side.  2. Continue Percocet 7.5/325 t.i.d., not to refill prior to  April 30, 2008.  3. Continue Baclofen 10 mg b.i.d., he has already got refills on this.      [name], M.D.  Electronically Signed     AEK/MedQ  D:  04/20/2008 10:41:33  T:  04/20/2008 23:46:44  Job #:  [id number]   cc:   Drue Dun, M.D.

## 2010-10-22 NOTE — Assessment & Plan Note (Signed)
[name] returns today.  He has a history of spastic quadriparesis,  mainly left-sided weakness and spasticity.  He underwent a left  obturator nerve block and neurolysis with phenol.  He has had  improvement of left hip pain and left lower extremity mobility.  He has  had no post-injection problems.  He is still complaining of low back  pain with standing for a long period of time or walking for a period of  time.  He has hand and foot paresthesias as well.  He rates his pain as  9/10 without medications, and when he takes medicines, he basically  indicates a near-complete pain relief.   He can walk 1-2 minutes at a time.  He climbs steps very slowly.  He  uses a cane.  He does drive.  He needs some assistance with meal prep,  household duties, shopping.   REVIEW OF SYSTEMS:  Positive for numbness and spasms and blood sugar  problems.  He has hypertension and recently had a blood pressure  medicine change.   CURRENT MEDICATIONS:  Obt[facility]:  1. Baclofen  5 mg b.i.d.  2. Oxycodone 7.5/325, #90 per month.   PHYSICAL EXAMINATION:  NEUROLOGIC:  Gait:  He has no evidence of  scissoring.  He has no evidence of toe drag and needs stability, a short  step length, widened base of support using a cane.  BACK:  Mild tenderness to palpation, lumbosacral junction.  EXTREMITIES:  His tone in his adductors of the hip is normal.  Lower  extremity strength 4- knee extensor and ankle dorsiflexion on the left  side, 5 on the right side.  Upper extremity strength 4+ on the left side  in the left biceps triceps grip and 5/5 on the right side.   IMPRESSION:  1. Spastic quadriparesis, incomplete, left greater than right, with      improvement of hip adductor spasticity following obturator nerve      neurolysis.  2. Cervical postlaminectomy syndrome.  3. Low back pain, chronic.  4. History of right shoulder impingement, currently not an active      problem.   PLAN:  1.  Continue Percocet 7.5/325 t.i.d.  2. Baclofen 5 mg p.o. b.i.d.  3. I will see him back in 2 months.  He can pick up his prescription      in between.  No signs of aberrant drug behavior, early refills,      etc.      [name], M.D.  Electronically Signed    AEK/MedQ  D:  09/07/2007 11:06:46  T:  09/07/2007 11:44:05  Job #:  [id number]   cc:   [facility]   Marolyn Hammock. Thad Ranger, M.D.  Fax: [phone]

## 2010-10-22 NOTE — Procedures (Signed)
NAME:  John [name], John [name] NO.:  [id number]   MEDICAL RECORD NO.:  [id number]          PATIENT TYPE:  REC   LOCATION:  [facility]                          FACILITY:  [facility]   PHYSICIAN:  [name], M.D.DATE OF BIRTH:  [birth year]   DATE OF PROCEDURE:  08/26/2007  DATE OF DISCHARGE:                               OPERATIVE REPORT   PROCEDURE:  Left obturator nerve neurolysis with phenol under Madaline Guthrie  guidance.   INDICATIONS:  Spastic quadriparesis due to incomplete spinal cord injury  with lower extremity abductor spasticity. only partially responsive to  baclofen.   DESCRIPTION OF PROCEDURE:  Informed consent was obtained after  describing risks and benefits of the procedure to the patient.  These  include bleeding, bruising, and infection; he elects to proceed, and has  given written consent.   The patient placed in a supine position using [place] EMG stimulator  utilized.  Appropriate abductor twitch obtained.  The area was marked  and prepped with Betadine and alcohol, and entered with a 22-gauge. 80-  mm, needle electrode under needle EMG guidance.  Obturator twitch was,  once again, obtained and confirmed at less than 1 mA and then 5 mL of 5%  phenol were injected.  The patient tolerated the procedure well.  Post  injection instructions were given.      [name], M.D.  Electronically Signed     AEK/MEDQ  D:  08/26/2007 13:30:41  T:  08/26/2007 56:21:30  Job:  [id number]

## 2010-10-22 NOTE — Assessment & Plan Note (Signed)
Mr. Overbeck came back for a nursing visit; however, he states that he has  had some exacerbation of pain and wanted to see the doctor.  I was able  to fit him in.  He has spastic quadriplegia due to incomplete spinal  cord injury, mainly affecting his left side, but does have some lower  extremity spasticity bilaterally.   He has had some cervical post-laminectomy syndrome chronic myofascial  pain as well and has had right subchorionic bursitis.   His main complaints today are increased spasms in his legs, some  increased pain in his legs as well.  In addition, he has had recurrence  of right shoulder pain.  He grades his pain as 10/10 currently.   CURRENT MEDICATIONS:  Hydrocodone 7.5 mg t.i.d. He is prescribed  Zanaflex 2 t.i.d. and 4 nightly; however, he is really just taking 2 mg  during the day and 4 mg at bedtime due to excessive drowsiness.  His  pill counts were accurate today.  He has had no signs of aberrant drug  behavior.  He is diabetic, on Lantus and Humalog.  He has hypolipidemia  on simvastatin.  Hypertension, on lisinopril and Lasix.   No allergies.   PHYSICAL EXAMINATION:  171/79, pulse 110, respirations 18.  O2 sat 98%  on room air.  GENERAL:  Mild discomfort.  Gait, as per usual, mildly crouched.  Forward flexed.  Goes without a  cane for several steps.  He has hyperreflexia, 3+ bilateral upper and  lower extremities.  He has modified Ash[place] 1+, 2 in the hip adductors  in a seated position.  He does have a narrow support when standing.  He  has a positive impingement sign of the right shoulder with abduction  around 90-100 degrees.  No pain in the trapezius.  Upper extremity  strength is 4+ in the left biceps/triceps grip, 5 on the right side.  Lower extremity strength, -4 in the knee extensor, and ankle, 5 on the  right side.   IMPRESSION:  1. Spastic quadriparesis, with this mainly on the left spasticity,      bilateral.  2. 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  3. Right shoulder impingement, subacromial bursitis.   PLAN:  1. Continue Percocet 7.5/325 t.i.d.  2. Given sedation problems with Zanaflex, will change to Baclofen 5      b.i.d.  3. Repeat shoulder injection.  4. Repeat obturator nerve blocks, first starting on the right, then      going to the left.  5. Once stable from above, he will able to take up the medicines on      off months, between MD visits.      [name], M.D.  Electronically Signed     AEK/MedQ  D:  08/12/2007 17:18:03  T:  08/12/2007 22:56:20  Job #:  [id number]   cc:   Donalee Citrin, M.D.  Fax: [phone]   Marolyn Hammock. Thad Ranger, M.D.  Fax: [phone]   Towana Badger, M.D.  Email: [email or web address]

## 2010-10-22 NOTE — Assessment & Plan Note (Signed)
Mr. Kishi returns today.  He had left obturator neurolysis. he did not  feel it to be as effective as it had been in the past.  He also has some  increased spasms in his upper extremities as well.  He has had no new  bowel or bladder dysfunction.   He has had no other medical complications in interval time.   MEDICATIONS:  His meds, include, Lantus, Humalog, Lasix, simvastatin,  aspirin, amlodipine, and vitamin D.   His pain medicines include Percocet 7.5/325 one p.o. t.i.d. and baclofen  10 p.o. b.i.d.   PHYSICAL EXAMINATION:  When he ambulates he does have adductor  spasticity of the left side.  No apparent spasticity with passive  abduction of the hips.   Lower extremity strength is 4/5 bilaterally, upper extremities 4/5  bilaterally.   He does have impingement sign on the right shoulder.   IMPRESSION:  Spasticity due to incomplete spinal cord injury.  He has  history of tetraplegia.   PLAN:  1. We will increase his baclofen to t.i.d.  2. Schedule for Botox, left adductor.  3. Right shoulder impingement syndrome.  We will do injection today.   In terms of narcotic analgesics, he is complying with this treatment  plan.  No signs of aberrant drug behavior other than using his pill  bottles, which he will bring back today prior to getting his  prescription.      [name], M.D.  Electronically Signed     AEK/MedQ  D:  08/24/2008 14:01:05  T:  08/25/2008 01:59:09  Job #:  [id number]

## 2010-10-22 NOTE — Procedures (Signed)
NAME:  John [name], UHER NO.:  [id number]   MEDICAL RECORD NO.:  [id number]           PATIENT TYPE:   LOCATION:                                 FACILITY:   PHYSICIAN:  [name], M.D.DATE OF BIRTH:  1956-04-29   DATE OF PROCEDURE:  DATE OF DISCHARGE:                               OPERATIVE REPORT   This is a right obturator neurolysis with phenol.   Indication is adductor spasticity right greater than left lower  extremity due to incomplete spinal cord injury.   Informed consent was obtained after describing risks and benefits of the  procedure with the patient.  These include bleeding, bruising, and  infection.  He elects to proceed and has given written consent.  The  patient placed in a supine position.  EMG stimulator used for external  DC stem to obtain obturator twitch.  Area marked and prepped with  Betadine, entered with a 50-mm 22-gauge needle.  Electrode connected to  a peripheral nerve stimulator following adductor twitch at 0.2 mA, 4 mL  of 5% phenol were injected.  The patient tolerated the procedure well.  Injection done after negative drawback for blood.  Return in 1 month for  left side.      [name], M.D.  Electronically Signed     AEK/MEDQ  D:  06/29/2008 10:00:32  T:  06/29/2008 22:31:31  Job:  [id number]

## 2010-10-22 NOTE — Procedures (Signed)
NAME:  John [name], John [name]                 ACCOUNT NO.:  [id number]   MEDICAL RECORD NO.:  [id number]          PATIENT TYPE:  REC   LOCATION:  [facility]                          FACILITY:  [facility]   PHYSICIAN:  [name], M.D.DATE OF BIRTH:  [birth year]   DATE OF PROCEDURE:  12/30/2007  DATE OF DISCHARGE:                               OPERATIVE REPORT   This is bilateral L5 dorsal ramus injection, bilateral L4 medial branch  block, bilateral L3 medial branch block under fluoroscopic guidance.   INDICATIONS:  Lumbar facet-mediated pain and lumbar spondylosis without  myelopathy.  Previous relief with medial branch blocks at the same  levels.  Pain is only partially responsive to medication management  including narcotic analgesic.  Pain interferes with mobility and self-  care.   Informed consent was obtained after describing risks and benefits of the  procedure with the patient.  These include bleeding, bruising, and  infection.  He elected to proceed and has given written consent.  The  patient placed prone on fluoroscopy table.  Betadine prep.  Sterile  drape.  A 25-gauge 1-1/2-inch needle was used to anesthetize the skin  and subcutaneous tissue with 1% lidocaine x2 mL.  Then, a 22-gauge 3-1/2-  inch spinal needle was inserted under fluoroscopic guidance first  starting at the left S1, SAP, and sacroiliac junction.  Bone contact  made, confirmed with lateral imaging.  Omnipaque 180 x0.5 mL  demonstrated no intravascular uptake.  Then, 0.5 mL of dexamethasone-  lidocaine solution was injected.  Dexamethasone-lidocaine solution  consisted of 4 mg/mL, dexamethasone x1 mL plus 2 mL of 2% MPF lidocaine.  Then left L5, SAP, and transverse process junction targeted.  Bone  contact made, confirmed with lateral imaging.  Omnipaque 180 x0.5 mL  demonstrated no intravascular uptake.  Then, 0.5 mL of dexamethasone-  lidocaine solution was injected.  Then L4, SAP, and transverse process  junction  targeted.  Bone contact made.  Omnipaque 180 x0.5 mL  demonstrated no intravascular uptake.  Then, 0.5 mL of dexamethasone-  lidocaine solution was injected.  The same procedure was repeated on the  corresponding levels on the right side using same needle, injectate, and  technique.  The patient tolerated the procedure well.  Pre- and post-  injection vitals were stable.  Post-injection instructions given.  Pre-  injection pain level 9/10.  Post-injection 4/10.  We will schedule for  radiofrequency neurotomy.  Discussed with the patient Valium 10 mg prior  to the procedure.  Continue his current pain medicines, but would wean  post procedure depending on the degree of pain relief and interference  scores.      [name], M.D.  Electronically Signed     AEK/MEDQ  D:  12/30/2007 09:36:42  T:  12/31/2007 04:54:09  Job:  [id number]

## 2010-10-22 NOTE — Procedures (Signed)
NAME:  JAMESMICHAEL, SHADD NO.:  [id number]   MEDICAL RECORD NO.:  [id number]          PATIENT TYPE:  REC   LOCATION:  [facility]                          FACILITY:  [facility]   PHYSICIAN:  [name], M.D.DATE OF BIRTH:  [birth year]   DATE OF PROCEDURE:  08/12/2007  DATE OF DISCHARGE:                               OPERATIVE REPORT   PROCEDURE:  Right subacromial bursa injection.   INDICATIONS:  Right subacromial bursitis.  Previous relief with  injection.  Pain is persistent despite medication management.   DESCRIPTION OF PROCEDURE:  Posterior lateral approach utilized.  The  area marked, prepped Betadine and 25-gauge 1-1/2 inch needle inserted to  1/2 depth.  After negative drawback for blood, 1 mL of 41 mL Depo-Medrol  and 4 mL of 1% lidocaine injected.  The patient tolerated procedure  well.  Post injection instructions given.      [name], M.D.  Electronically Signed     AEK/MEDQ  D:  08/12/2007 17:13:05  T:  08/13/2007 10:52:20  Job:  [id number]

## 2010-10-22 NOTE — Assessment & Plan Note (Signed)
John [name] returns today. I last saw him September 23, 2007. He had a fall on  September 13, 2007, increased bilateral calf pain and foot pain. He had right  tib-fib two views which were normal ankle films. Because of lower  extremity symptomatology and the fall, I did order a MRI of the lumbar  spine. This came back basically with L4-5/L5-S1 facet degeneration but  no compressive lesions in the lumbosacral spine.   He has had some easing of symptoms although still quite painful and  still has spasms in the calves as well.   He has burning, stabbing aching; interfering with activity at a 10/10  level. Sleep was fair. Pain was worse with walking and sitting and  standing. He can climb steps slowly. He uses a walker to ambulate. He  needs some assistance with meal prep, shopping, household duties.   REVIEW OF SYSTEMS:  Positive for numbness, trouble walking.   His blood pressure is 140/70, pulse 86, respirations 18, O2 saturation  97%. His motor strength is 4/5 on the left side in hip flexion, knee  extension, and ankle dorsi flexion; right side 5/5 in hip flexion, knee  extension and ankle dorsi flexion. He has no evidence of clonus at the  ankles but has some tightness in the heel cords.   His back has no tenderness to palpation.   Review of the MRI lumbar spine as noted. No compressive lesions. Has  some facet degeneration L4-5/L5-S1. His pain is increased with extension  and relatively relieved with flexion.   IMPRESSION:  1. 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 His      lower extremity spasms may be a result of this fall. Will increase      his baclofen.  2. Increase back pain. Will do facet medial branch blocks. Continue      his Percocet 7.5/325 t.i.d.      [name], M.D.  Electronically Signed     AEK/MedQ  D:  10/08/2007 16:20:57  T:  10/08/2007 17:03:18  Job #:  [id number]

## 2010-10-22 NOTE — Assessment & Plan Note (Signed)
[name] returns today after I last saw him on March 30, 2007.  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  He has shoulder impingement sign bilateral  shoulders, although this has not been really painful for him lately.  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  He is still using a straight cane rather than a  walker.  He has had no increase in his lower extremity spasticity.  He  does have some spasms in his shoulder, left greater than right, during  the night but this is not really interfering with sleep.  His pain  indicates it is a 9/10 level, fairly consistent during the day but  overall he states that it feels like he is doing quite well.  His pain  is worse with bending, sitting, standing, improves with medication and  TENS unit.  He climbs steps, he drives, he transfers alone, he needs  some assistance with bathing, meal preparation, household duties and  shopping.   REVIEW OF SYSTEMS:  Positive for bowel control problems.  What it really  boils down to is that he has to do what sounds like a cough or a Crede  maneuver to initiate bowel movement due to spinal cord injury.  Trouble  walking, spasms, limb swelling, poor appetite, diarrhea, night sweats,  blood sugar problems.   MEDICATIONS:  Hydrocodone 7.5 t.i.d.   OTHER PHYSICIANS:  1. Dr. Mannie Stabile.  2. Dr. Wynetta Emery.  3. Dr. Thad Ranger.   SPASTICITY MEDICINE:  Zanaflex 2 mg t.i.d. and 4 mg at bedtime.   PHYSICAL EXAMINATION:  GENERAL:  No acute distress.  Mood and affect  appropriate.  His gait is forward-flexed.  He can go without a cane for  several steps.  He has hyperreflexia bilateral upper and lower  extremities 3+ but no evidence of clonus in the ankles or wrists.  His  neck range of motion is about 75% forward flexion, extension, lateral  rotation and bending.  He has 4-/5 strength in the left biceps, triceps,  grip, knee extensor and ankle dorsiflexor, 5/5 on the right side.   IMPRESSION:  1. Spastic quadriparesis; his weakness is mainly on the left, his      spasticity is bilaterally.  2. 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  3. Shoulder impingement, stable.   PLAN:  1. Continue Percocet 7.5/325 t.i.d.  2. Continue Zanaflex 2 mg t.i.d. and 4 at bedtime.  3. No need for shoulder injection today.  4. No need for obturator nerve block today.   I will see him back in 3 months.  Nursing visit in 1 month.      [name], M.D.  Electronically Signed     AEK/MedQ  D:  06/17/2007 11:57:00  T:  06/17/2007 12:54:01  Job #:  [id number]   cc:   Towana Badger, M.D.  Email: [email or web address]   Donalee Citrin, M.D.  Fax: [phone]   Dr. Thad Ranger

## 2010-10-22 NOTE — Assessment & Plan Note (Signed)
A 55 year old male with cervical myelopathy, spinal cord injury,  spasticity related to a fall in December, 2005, status post C3-4, C4-5  diskectomy/fusion.  His gait is with a straight cane rather than a  walker.  He has no increase in his lower extremity spasticity.  He still  has some spasms in his shoulder at night, but overall, this is not  interfering with any sleep.   The pain is 9/10 with walking, bending, standing.  He climbs steps  slowly.  He drives.  He needs some assistance with bathing, meal prep,  household duties, and shopping.  He does have mild quadriparesis due to  his incomplete cervical spinal cord injury.   His review of systems is positive for numbness, tingling, spasm, and  depression but negative for anxiety or suicidal thoughts.   SOCIAL HISTORY:  Married.  Smokes a half pack per day.   PHYSICAL EXAMINATION:  Blood pressure 158/81, pulse 103, respirations  18.  O2 sat 96% on room air.  He ambulates with a cane.  A well-developed and well-nourished male.  Mood and affect are bright.  He has -4/5 strength in the left biceps,  triceps grip, knee extensor, ankle dorsiflexor, and full strength on the  right side.  Hyperreflexia at the bilateral upper and lower extremities.  Mild tenderness, upper traps.  Neck range of motion is reduced to approximately 50%.  Gait shows no evidence of toe drag or knee instability.  He has a  somewhat crouched gait and gets up slowly but independently from a  sitting position.   IMPRESSION:  1. Spastic quadriparesis, incomplete spinal cord injury, mainly      affecting the left side.  2. Shoulder impingement, probable subacromial bursitis, improved since      last visit.  3. Myofascial pain, bilateral upper trapezii.  4. Cervical post laminectomy syndrome, chronic pain.   PLAN:  1. Continue current medications, which include Percocet 7.5 t.i.d.,      Zanaflex 2 t.i.d. and 4 nightly.  2. I will see him back in two months with  a nursing visit in one      month.      [name], M.D.  Electronically Signed     AEK/MedQ  D:  03/30/2007 09:47:28  T:  03/30/2007 17:42:58  Job #:  [id number]

## 2010-10-22 NOTE — Assessment & Plan Note (Signed)
A 55 year old male with cervical myelopathy, spinal cord injury,  spacitiy related to fall December 2005. Status post C3-4, C4-5  diskectomy fusion. I last saw him August 18, 2006, we increased Zanaflex  to 2 mg 3 times a day and 4 mg at night. He has been doing well with  this, has not had any blood work since this time. He overall feels like  he is doing quite well. He states his pain right now is 6 out of 10 and  goes up to 8. Sleep is fair, has some spasms at night in upper and lower  extremities but this is not progressively getting worse. He is overall  quite happy how he is doing. He can walk about 4 minutes at a time with  a walker. He does not drive but would like to get back to doing so. He  needs help with transfers at some times and other times transfers alone.  He does need some help with bathing, meal prep, household duties, and  shopping. He has numbness and tingling in his upper and lower  extremities. He has depression but no suicidal thoughts. He has some  poor appetite. He lives with his wife, smokes 1/3 of a pack per day.   His blood pressure is 132/67, pulse 90, respirations 18, O2 sat 96% in  room air.  IN GENERAL: No acute distress. Mood and affect appropriate. His Ashworth  score is 1 at the biceps bilaterally.  He is a 2 at the hip adductors.  He has 2 beats of clonus left ankle, 0 at the right. He has 3+ reflexes  on the left biceps, triceps, brachioradialis, and the patellar. On the  right he is 2 + in the same.   His gait is using a walker, decreased step length, less shuffling than  before, no scissoring noted.   IMPRESSION:  Cervical myelopathy, incomplete spinal cord injury. His  spacitiy is well controlled on the current regimen. I do not think he  needs any further injections at this time.   PLAN:  We will continue Zanaflex 2 mg t.i.d. and 4 mg at night. Continue  his Percocet 7.5 t.i.d. I will see him back in 3 months. He states that  he has an  appointment with Dr. Providence Lanius from family practice next week and  I have asked him to give a script for LFTs to be done at the same time  if these are not already part of the panel that is being ordered. I  would like to get copy of this given that Zanaflex can increase AST and  ALT.      [name], M.D.  Electronically Signed     AEK/MedQ  D:  11/10/2006 13:55:04  T:  11/10/2006 14:30:44  Job #:  [id number]   cc:   Donalee Citrin, M.D.  Fax: [phone]   Devra Dopp, MD

## 2010-10-22 NOTE — Procedures (Signed)
NAME:  John [name], John [name] NO.:  [id number]   MEDICAL RECORD NO.:  [id number]          PATIENT TYPE:  REC   LOCATION:  [facility]                          FACILITY:  [facility]   PHYSICIAN:  [name], M.D.DATE OF BIRTH:  [birth year]   DATE OF PROCEDURE:  03/30/2008  DATE OF DISCHARGE:                               OPERATIVE REPORT   PROCEDURE:  Left subacromial bursa injection.   INDICATION:  Left subacromial bursitis and only partially responsive to  medication management and interferes with sleep.   Informed consent was obtained after describing risks and benefits of the  procedure with the patient.  These include bleeding, bruising, and  infection.  He elected to proceed and has given written consent.  The  patient placed in a seated position.  Posterolateral approach utilized.  The area marked and prepped with Betadine and alcohol and a 25-gauge 1-  1/2 inch needle, 1 mL of 40 mg/mL Depo-Medrol, and 4 mL of 1% lidocaine  injected after negative draw-back of blood.  The patient tolerated the  procedure well and instructed him on possibility of blood sugar being  elevated for a couple of days after injection.  He understands.  I will  see him back in about  1 mo.   DICTATION ENDED AT THIS POINT      [name], M.D.  Electronically Signed     AEK/MEDQ  D:  03/30/2008 16:35:54  T:  03/31/2008 03:37:55  Job:  [id number]

## 2010-10-22 NOTE — Assessment & Plan Note (Signed)
HISTORY OF PRESENT ILLNESS:  John [name] returns today.  He was originally  scheduled for a repeat medial branch block.  He had very good pain  relief over the first couple of days and then pain gradually returned in  the low back area.  He states he was able to complete the trip to [place],  driving most of the way, because of some excess relief due to the  injections.  He has pain back at the 8-9 level, aching.  It interferes  with activity at 9-10 level.  Sleep is fair.  Pain is worse with walking  and bending.  Relief from medications is complete.  For a period of  time, he can walk 2-3 minutes at times.  He climbs up.  He drives.  He  needs assistance with meal prep, household duties, and shopping.   REVIEW OF SYSTEMS:  Positive for trouble walking or spasms.  Denies loss  of taste, poor appetite, but no weight loss.  Denies depression or  anxiety, although he states that when he sometimes thinks about it,  wishes he could do more activities like he used to.   SOCIAL HISTORY:  Visits with family.  States that he had portion of his  medications taken by his grandson, who is 48, up in [place].  He states  that he now has a safe to put his medications in.   PHYSICAL EXAMINATION:  In general in no acute distress.  Mood and affect  appropriate.  His tone is normal.  Ashworth in the upper and lower  extremities is normal.  Strength in hip flexors, knee extensors, ankle  dorsiflexors on the right side and 4/5 on the left side.  His grip is 4  on the left and 5 on the right.   He has no tenderness to palpation of lumbar paraspinals at the current  time, but has pain with extension, but not with flexion of the lumbar  spine.  Gait shows no evidence of toe drag or knee instability.   IMPRESSION:  1. Cervical myelopathy.  History of spastic quadriparesis.  Continue      on his current dose of baclofen 10 mg b.i.d.  This was increased a      couple of months ago.  2. Lumbar facet syndrome.  He  does have lumbar spondylosis.  He had      good relief with medial branch blocks. We will repeat in one month      with goal of reducing medication dosage.  In the meantime, we will      continue with Percocet 7.5/325 t.i.d., monitor for any other      aberrant drug behavior.  Check urine drug screen if this recurs.      John [name], M.D.  Electronically Signed     AEK/MedQ  D:  12/02/2007 09:46:44  T:  12/03/2007 00:43:36  Job #:  [id number]

## 2010-10-22 NOTE — Procedures (Signed)
NAME:  DJ, SENTENO NO.:  [id number]   MEDICAL RECORD NO.:  [id number]           PATIENT TYPE:   LOCATION:                                 FACILITY:   PHYSICIAN:  [name], M.D.DATE OF BIRTH:  1956-02-01   DATE OF PROCEDURE:  DATE OF DISCHARGE:                               OPERATIVE REPORT   Left obturator nerve neurolysis.   INDICATION:  Abductor spasticity due to complaint of spinal cord injury.   An informed consent was obtained after describing risks and benefits of  the procedure with the patient.  These include bleeding, bruising, and  infection.  He elected to proceed and has given written consent.  The  patient placed in supine position.  EMG stimulator used for external DC  stem to obtain obturator twitch.  Area marked, prepped with Betadine,  entered with 40-mm, 22-gauge needle, and electrode connected to  peripheral nerve stimulator.  Had abductor twitch at 0.6 mA, 4 mL of 5%  phenol injected after negative drawback for blood.  The patient  tolerated the procedure well.  See him back in 1 month for regular  followup.      [name], M.D.  Electronically Signed     AEK/MEDQ  D:  07/27/2008 16:17:45  T:  07/28/2008 04:32:58  Job:  [id number]

## 2010-10-22 NOTE — Assessment & Plan Note (Signed)
OFFICE VISIT   AADIT, HAGOOD  DOB:  1955-11-07                                       08/09/2008  ZOXWR#:[id number]   The patient is a 55 year old male who was recently seen by his  podiatrist in [facility].  It was noted at that time that the left first  toe was dark after treating a hang nail.  He has had no drainage from  the toe.  He denies fever or chills.  He does have a history of  diabetes.  He also has a history of tobacco use of 1/2 pack of  cigarettes per day.  He has recently tried Chantix.  However, he has not  been able to quit smoking.  Other atherosclerotic risk factors include  hypertension and elevated cholesterol.  He denies any claudication  symptoms.  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   PAST MEDICAL HISTORY:  Otherwise unremarkable.   FAMILY HISTORY:  Remarkable for his mother, brothers and father who had  coronary disease at age less than 28.  His father also had an amputation  of his leg.   SOCIAL HISTORY:  He is married, works as a Financial planner,  smokes 1/2 pack of cigarettes per day.  He does not consume alcohol  regularly.   REVIEW OF SYSTEMS:  He is 5 feet 11 inches, 205 pounds.  CARDIAC, GI, RENAL, NEUROLOGIC, PSYCHIATRIC, HEMATOLOGIC:  All negative.  PULMONARY:  He has asthma.  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   PAST SURGICAL HISTORY:  None.   MEDICATIONS:  1. Baclofen 10 mg once a day.  2. Sodium bicarbonate 650 mg once a day.  3. Simvastatin once daily.  4. Lasix 20 mg once a day.  5. Amlodipine 10 mg once a day.  6. Vitamin D.  7. Aspirin 81 mg once a day.  8. Percocet p.r.n. for pain in his feet.   He has no known drug allergies.   PHYSICAL EXAM:  Blood pressure is 142/82 in the right arm, pulse is 90  and regular.  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  Neck:  Has 2+  carotid pulses without bruit.  Chest:  Clear to auscultation.  Cardiac:  Regular rate and rhythm without murmur.  Abdomen:  Obese, soft,  nontender, nondistended with no masses.  Neurologic:  Shows symmetric  upper extremity and lower extremity motor strength which is 5/5.  Extremities:  He has 2+ brachial and 1+ radial pulses bilaterally.  He  has 2+ femoral pulses bilaterally.  He has absent popliteal and pedal  pulses bilaterally.  The left first toe is dark in color but there is  healing tissue at the site of the hang nail and there is dry skin around  this.   He had bilateral ABIs performed today which showed an ABI on the right  side of 0.59, on the left of 0.60, digit pressure on the left was 74, on  the right was 37, waveforms were biphasic bilaterally.   SUMMARY:  The patient has multiple atherosclerotic risk factors for  occlusive disease.  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  However, he  does have a reasonable enough toe pressure on the left side that he  should be able to heal the wound on his foot spontaneously.  He will  follow up with me in 3 weeks' time.  If the wound is healed, we will  have him follow up very closely, but hopefully will avoid any  revascularization at this time.  Approximately 3 minutes of time was  spent discussing smoking cessation with the patient as well as the risk  of atherosclerotic occlusive disease secondary smoking and diabetes  combined.   Janetta Hora. Fields, MD  Electronically Signed   CEF/MEDQ  D:  08/28/2008  T:  08/29/2008  Job:  [id number]   cc:   Myeong O. Sheard, D.P.M.

## 2010-10-22 NOTE — Procedures (Signed)
NAME:  John [name], John [name]                 ACCOUNT NO.:  [id number]   MEDICAL RECORD NO.:  [id number]          PATIENT TYPE:  REC   LOCATION:  [facility]                          FACILITY:  [facility]   PHYSICIAN:  [name], M.D.DATE OF BIRTH:  Oct 12, 1955   DATE OF PROCEDURE:  02/17/2008  DATE OF DISCHARGE:                               OPERATIVE REPORT   PROCEDURE:  Left L5 dorsal ramus radiofrequency neurotomy, left L4  medial branch radiofrequency neurotomy, left L3 medial branch  radiofrequency neurotomy.   INDICATIONS:  Lumbar spondylosis without myelopathy.  Pain is  temporarily relieved by medial branch blocks at corresponding levels.  Left side is worse than right side.  Pain does interfere with self-care  and mobility only partial response to medication management including  narcotic and analgesic medications.   DESCRIPTION OF PROCEDURE:  Informed consent was obtained after  describing risks and benefits of the procedure with the patient.  These  include bleeding, bruising, and infection.  She elects to proceed and  has given written consent.  The patient placed prone on fluoroscopy  table.  Betadine prep, sterile drape 25-gauge 1-1/2-inch needle was used  to anesthetize the skin and subcu tissue with 1% lidocaine x2 mL.  A 20-  gauge 10-cm RF needle with 10-mm curved active tip inserted under  fluoroscopic guidance targeting first the left S1 SAP sacral junction.  Bone contact made confirmed with lateral imaging sensory stem at 50 Hz  followed by motor stem at 2 Hz confirmed proper needle location followed  by injection of 1 mL of solution containing 1 mL of 4 mg/mL  dexamethasone and 2 mL of 2% MPF lidocaine.  Followed by radiofrequency  lesioning 70 degrees Celsius for 70 seconds, and left L5 SAP transverse  process junction targeted bone contact made confirmed lateral imaging.  Sensory stem at 50 Hz followed by motor stem at 2 Hz confirmed proper  needle location followed by  injection of 1 mL of dexamethasone-lidocaine  solution and radiofrequency lesioning 70 degrees Celsius for 70 seconds.  Then, the left L5 SAP transverse process junction targeted bone contact  made confirmed with lateral imaging.  Omnipaque sensory stem at 50 Hz  followed by motor stem at 2 Hz confirmed proper needle location.  Followed by injection 1 mL of dexamethasone and lidocaine solution and  radiofrequency lesioning 70 degrees Celsius for 70 seconds.  Then, the  left L4 SAP transverse process junction targeted bone contact made  confirmed with lateral imaging.  Sensory stem at 50 Hz followed by motor  stem at 2 Hz confirmed proper needle location followed by injection 1 mL  of dexamethasone-lidocaine solution and radiofrequency lesioning 70  degrees Celsius for 70 seconds.  The patient tolerated the procedure  well.  Post injection vitals stable.  Post injection instructions given,  loss her back in 3 months for repeat injection.      [name], M.D.  Electronically Signed     AEK/MEDQ  D:  02/17/2008 15:52:28  T:  02/18/2008 04:23:18  Job:  [id number]

## 2010-10-22 NOTE — Assessment & Plan Note (Signed)
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  He has only had  partial results with adductor injection of phenol, was scheduled for  Botox, but had a co-pay which he was not willing to pay.  His right  shoulder pain improved after the right corticosteroid injection.  He has  done a little bit better with increasing his baclofen t.i.d.   His new issue today is left calf pain when he is walking up and down his  driveway.  There is a small incline.  He states that if he sits down for  a while, it will get better, also just leaning against something helps  him as well.   He is a smoker.  He denies any right lower extremity pains with this.  He has had no other new medical problems in the interval time.   His average pain is 8-9.  This is really his chronic pain in the back  and lower extremities as well as left upper extremity.  He needs some  help with meal prep and shopping, but otherwise is independent with all  of his self-care and mobility.  His review of systems is positive for  numbness in the legs, trouble walking, and spasms.   His blood pressure is 140/58, pulse 82, respirations 18, O2 sat 96% on  room air.  Generally, he is in no acute distress.  Orientation x3.  Affect alert.  Gait is with a limp.  He has no calf tenderness.  Negative Thompson test.  Knee range of motion is full.  No evidence of  knee effusion.  Lower extremity strength is 4+ which is his baseline in  bilateral lower extremities.   His pulses are nonpalpable in bilateral feet in terms of dorsalis pedis  and posterior tibialis.  There is no evidence of skin lesions.  He has  normal sensation in his lower extremities.  Deep tendon reflexes are 2+  in bilateral knee and ankle.   IMPRESSION:  1. Incomplete spinal cord injury, spastic tetraplegia.  Spasms at this      point are fairly well controlled with the increase of  baclofen.  2. Right shoulder impingement syndrome, improved after injection.  3. Left calf pain.  This could be either a vascular claudication or a      neurogenic claudication.  Given his smoking history and poor      pulses, we will go ahead and check an arterial Doppler and if this      is negative, we need to do EMG versus MRI.   I discussed with the patient and agrees with plan.  He feels his  oxycodone on September 25, 2008, so he is not due for that yet.  Also has  refills on his baclofen.  I will see him back in about 3 weeks in  followup of his arterial Dopplers.      [name], M.D.  Electronically Signed     AEK/MedQ  D:  10/05/2008 15:04:40  T:  10/06/2008 03:29:46  Job #:  [id number]

## 2010-10-22 NOTE — Assessment & Plan Note (Signed)
REASON FOR VISIT:  Back pain and lower extremity pain.   A 55 year old male with incomplete spinal cord injury causing spastic  tetraplegia, left side greater than right side.  Partial results of  adductor injection of Phenol.  He has been on baclofen for lower  extremity spasticity.  He was complaining on last visit that he had pain  with ambulation.  Mainly with calf, we did order arterial Dopplers.  Looking back at his notes, he actually had a Vein and Vascular Surgery  visit August 09, 2008, with Dr. Fabienne Bruns.  He is noted to have  absent pedal and popliteal pulses.  ABI on the right was 0.59, on the  left it was 6.60.  Digital pressure left 74, right 37.  His arterial  Dopplers performed Oct 10, 2008, demonstrated right ABI of 0.47 and left  ABI of 0.51.  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   PHYSICAL EXAMINATION:  GENERAL:  He had no new complaints today.  A well-  developed, well-nourished male, in no acute distress.  Alert and  oriented x3.  Affect is alert.  Gait, he is using a cane.  VITAL SIGNS:  His blood pressure is 135/62, pulse 89, respirations 18,  O2 sats 96% on room air.  BACK:  It is nontender to palpation.  Deep tendon reflexes are 2+  bilateral knee and ankle.  EXTREMITIES:  Normal sensation in the lower extremities.  Pulse is not  palpable in the foot and ankle area.  SKIN:  No evidence of skin lesions.   IMPRESSION:  1. Incomplete cervical spine injury with spastic tetraplegia primarily      affecting the left side.  2. Lower extremity pain, it appears to be more avascular rather than a      neurogenic claudication.  I have asked him to follow up with Dr.      Darrick Penna to see if there is anything else that can be done.  I have      advised him to quit smoking.  He states that he was doing okay on      Chantix, but then did not want to taper his Chantix any more and he      went back to  smoking.   I will see him back in 3 months.  We will continue him on same pain  medicine.  He is oxycodone 7.5/325 t.i.d.  He has been on this for over  a year without any signs of aberrant drug behavior.      [name], M.D.  Electronically Signed     AEK/MedQ  D:  10/23/2008 15:48:39  T:  10/24/2008 07:11:55  Job #:  [id number]   cc:   Janetta Hora. Fields, MD  [address], [place] [address]

## 2010-10-22 NOTE — Procedures (Signed)
NAME:  John [name], John [name] NO.:  [id number]   MEDICAL RECORD NO.:  [id number]          PATIENT TYPE:  OUT   LOCATION:  EKG                          FACILITY:  [facility]   PHYSICIAN:  [name], M.D.DATE OF BIRTH:  [birth year]   DATE OF PROCEDURE:  11/04/2007  DATE OF DISCHARGE:  10/04/2007                               OPERATIVE REPORT   PROCEDURE:  Bilateral L5 dorsal ramus injection, bilateral L4 medial  branch block, bilateral L3 medial branch block under fluoroscopic  guidance.   INDICATIONS:  Lumbar axial pain with increased pain recently only  partially responsive to medication management.  Pain interferes with  meal prep, household duties, shopping, as well as walking and bending  and standing.   Informed consent was obtained after describing risks and benefits of the  procedure to the patient.  These include bleeding, bruising, infection.  Potential benefits include  pain relief and improved diagnosis.  He does  have L4-5, L5-S1 facet degeneration on MRI; however, this does not  always predict this as a pain generator.   The patient placed prone on fluoroscopy table.  Betadine prep, sterile  drape.  A 25-gauge 1-1/2-inch needle was used to anesthetize the skin  and subcutaneous tissue with 1% lidocaine x2 mL.  A 22-gauge, 3-1/2-inch  spinal needle was inserted first charting the left S1 SAP sacral ala  junction.  Bone contact made, confirmed with lateral imaging.  Omnipaque  180 x 0.5 mL demonstrated no intravascular uptake, and then 0.5 mL of  dexamethasone lidocaine solution was injected and the left L5 SAP  transverse process junction targeted.  Bone contact made, confirmed with  lateral imaging.  Omnipaque 180 x 0.5 mL demonstrated no intravascular  uptake, and then 0.5 mL dexamethasone lidocaine solution was injected  and the left L4 SAP transverse junction targeted.  Bone contact made,  confirmed with lateral imaging.  Omnipaque 180 x 0.5 mL  demonstrated no  intravascular uptake and then 0.5 mL of dexamethasone lidocaine solution  was injected.  The patient tolerated the procedure well.   Pre and post injection vitals stable.  Post injection instructions  given.  The patient to return in 1 month for possible reinjection at  that time.  Prescription for his Percocet 7.5/325 t.i.d. written.      [name], M.D.  Electronically Signed     AEK/MEDQ  D:  11/04/2007 15:35:15  T:  11/04/2007 18:25:06  Job:  [id number]

## 2010-10-22 NOTE — Assessment & Plan Note (Signed)
Mr. Scheidt follows up today.  I last saw him on April 20, 2008.  At  that time, he was having some problems with increased balance related to  the lower extremity weakness particularly on the left side.  I did send  him to physical therapy 2 times a week for 3-4 weeks balance program  plus some exercise requested, he has been going through this.  He has  been doing some hip strengthening exercises.  He has had no myoclonic,  no uncontrolled muscle activity other than spasticity.  No scissoring-  type gait problems.   CURRENT MEDICATIONS:  1. Percocet 7.5/325 t.i.d.  He is not showing any signs of aberrant      drug behavior using this.  2. Baclofen 10 mg p.o. t.i.d.   PHYSICAL EXAMINATION:  A 4/5 bilateral deltoid, biceps and triceps.  Right lower extremity strength is 5/5 and left lower extremity is 4/5  with the exception of hip abductors, which are 4/5 bilaterally.  There  is no evidence of clonus at the ankle.  No evidence of hip adductors,  abductors, or spasticity.  Deep tendon reflex is 2+ bilateral biceps,  triceps, brachioradialis, knee and ankle.  Gait, he is not using a cane  when he is closed to a wall.  He shows no signs of knee instability or  toe drag.   IMPRESSION:  Cervical myelopathy with multifactorial balance disorder.   PLAN:  1. We will continue physical therapy.  He is doing some stretching of      hip flexors as well as some hip abductors strengthening.  2. Continue Percocet 7.5/325, do not fill prior to May 30, 2008.  3. Continue baclofen 10 mg b.i.d.  4. I will see him back in 1 month.  No need for obturator nerve blocks      at this time.      [name], M.D.  Electronically Signed     AEK/MedQ  D:  05/23/2008 11:23:40  T:  05/24/2008 05:24:46  Job #:  [id number]

## 2010-10-22 NOTE — Assessment & Plan Note (Signed)
John [name] follows up today.  I last saw him on May 23, 2008.  He  has a history of cervical myelopathy with multifactorial balance  disorder.  He is starting to experience some increasing spasms in his  hip abductors.  He also noticed some popping sensation in the right knee  when he first gets up.   He has been going through some physical therapy.  He is finishing up  with this, doing some hip strengthening exercises as well as balance  program.   His current meds include Percocet 7.5/325 t.i.d. no signs of any  abhorrent drug behavior using this.   Baclofen 10 mg p.o. t.i.d.   PHYSICAL EXAMINATION:  A 4/5 bilateral deltoid and biceps grip.  Right  lower extremities 5/5, left lower extremity 4/5, hip at the abductors  are 4/5 bilaterally.  He does have evidence of hip abductor spasticity  at the current time.  He has no ankle spasticity.  Gait, close step  pattern with semi crouch gait pattern.   IMPRESSION:  1. Cervical myelopathy with some increasing lower extremity      spasticity.  We will go ahead and reschedule for a right hip, right      obturator nerve block with phenol neurolysis.  We will see if this      helps resolve some of his right knee symptomatology as well.  We      will plan to do left side 1 month later.  2. Continue his Percocet and his baclofen as is.  3. Pain level 8/10 at this time, walking tolerance 2-3 minutes,      functional status needs help with meal prep, household duties,      shopping.   Review of systems is positive for numbness, trouble walking, spasms,  poor appetite, night sweats, cluster regulation problems.   Blood pressure.   Oswestry disability score is 58%.   VITALS:  Blood pressure 134/80, pulse 107, respirations 18, and O2 sat  95% on room air.      [name], M.D.  Electronically Signed     AEK/MedQ  D:  06/20/2008 17:13:53  T:  06/21/2008 06:36:59  Job #:  [id number]

## 2010-10-25 NOTE — Assessment & Plan Note (Signed)
PROCEDURE:  Right obturator nerve block.   INDICATIONS FOR PROCEDURE:  Cervical myelopathy with adductor spasticity  causing scissoring of gait.   Previous good relief with left obturator block dated November 24, 2005.   Informed consent was obtained after describing the risks and benefits of the  procedure to the patient.   The patient was placed supine on exam table, external DC stem using EMG  stimulator utilized, adductor twitch isolated, area marked and prepped with  Betadine, entered with a 40-mm, 22-gauge needle electrode attached to  peripheral nerve stimulator. Once again adductor twitch was obtained and  confirmed at 0.9 milliamps and then a solution containing 5% phenol x4 mL  was injected after a negative drawback for blood. The patient tolerated the  procedure well.   The patient also wishes for me to take over his narcotic analgesic  prescription. We would need to get urine drug screen first as well as inform  primary care physician and only then take this over. We will have him do  pill count today as well as record information on bottle. Will need to use  one pharmacy.      [name], M.D.  Electronically Signed     AEK/MedQ  D:  03/05/2006 12:49:54  T:  03/07/2006 07:39:58  Job #:  [id number]   cc:   Casimiro Needle L. Thad Ranger, M.D.  Fax: [phone]   Devra Dopp, MD   Sibyl Parr Darrick Penna, M.D.

## 2010-10-25 NOTE — Assessment & Plan Note (Signed)
Tuesday, April 28, 2006   John [name] returns today.  He has a history of cervical myelopathy with  incomplete spinal cord injury spasticity.  He is improved his adductor  spasticity after right obturator nerve block, which was performed on  March 07, 2006.  He had a previous left obturator nerve block done in  June, 2007.  Continues to have some shoulder pain on both sides.  He  ambulates with a walker.  I have taken over the prescription of his  oxycodone after his urine tox screen came back without any evidence of  illicit drug use and undisclosed narcotic analgesic usage.  His pain is  mainly in his low back but also in his shoulders.  He had some spasms in his  legs.  He states that the spasms in legs are both day and night and also  contribute to his pain and reduce his functional ability in terms of  walking.   SOCIAL HISTORY:  Married.  Smoke 6-8 cigarettes a day.   His blood pressure is 147/73, pulse 114, respiratory rate 15, O2 satting 84%  on room air.  GENERAL:  No acute distress.  Mood and affect appropriate.  He is oriented  x3.  Gait is using a walker with small step length.  His deep tendon reflexes are 3+ bilateral biceps, triceps, brachial  radialis, on knees and ankles.  So evidence of clonus.  His Ashworth is 1 at  the hamstring and the quads and 1 at the ankle.   IMPRESSION:  Cervical myelopathy incomplete spinal cord injury with lower  greater than upper extremity spasticity.   PLAN:  Will initiate Zanaflex 2 mg capsule at night.  Increase to t.i.d.  slowly.  I have run a med check through [facility] showing main interaction  would be with Vasotec in terms of causing additional hypotension and I  cautioned the patient that if he feels dizzy upon standing that this would  be a reason to stop Zanaflex or reduce to the previous dose, which did not  cause symptoms.  I will see him back in 1 month.  We will continue Percocet  at current dosage of 7.5 mg p.o.  t.i.d.  Also use Lidoderm patch.      [name], M.D.  Electronically Signed     AEK/MedQ  D:  04/28/2006 13:46:21  T:  04/28/2006 14:35:36  Job #:  [id number]   cc:   Casimiro Needle L. Thad Ranger, M.D.  Fax: [phone]   Devra Dopp, MD

## 2010-10-25 NOTE — Group Therapy Note (Signed)
HISTORY:  The patient is a 55 year old male who had a fall with traumatic  spinal injury with cervical myelopathy December 2005.  He had MRI  demonstrating critical stenosis at C3-4, C4-5, with cord signal changes.  He  had IV steroids and then ended up undergoing diskectomy and fusion, C3-4, C4-  5, by Donalee Citrin, M.D.  He has had problems with spasticity, spastic gait.  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  His last  MRI of the cervical spine with and without contrast was June 28, 2005,  showing some cord signal changes, and I was able to compare these prior at  around C5 but no compressive lesions.  Radiology notes signal changes at C3-  4 as well as C5.  He had mild bulging of disk at T2-3 to T3-4 but no  compressive lesions.  An MRI of the lumbar spine is really unremarkable on  October 01, 2005.  He was placed in physical therapy and started to develop  increasing weakness.  He had diarrhea as well, admitted on Oct 16, 2005.  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   He was discharged on May 16.   His most troubling complaint right now is lower extremity spasms.  He has  been tried on Neurontin in the past, and increasing dosages beyond 300 mg  b.i.d. had made him very tired.  Of note is that he had renal insufficiency  during his hospital admission with creatinine getting up to 1.5.   He has also tried Baclofen, which caused excessive weakness.   He complains of pain in the shoulders, arms as well as low back, but nothing  significantly in the neck or head.   His functional status:  Needs help with bathing, dressing, meal prep,  household duties,  shopping.  Needs help with transfers, but this is more of  a stand-by assistance.  He uses a walker and has done so since last summer.  He states that he has had to give up a lot of fun activities such as fishing  that he used to do.   REVIEW OF SYSTEMS:  Also positive for bowel and bladder control problems and  depression.  He has gained weight, which he associates with decreased  activity and swelling in his legs.   He smokes a half-pack a day.   PHYSICAL EXAMINATION:  GENERAL:  African-American male in no acute distress.  He appears drowsy but is able to answer questions without evidence of  dysarthria or aphasia.  VITAL SIGNS:  Blood pressure 103/57, pulse 99, respiratory rate 16, O2  saturation 96% on room air.  MUSCULOSKELETAL/NEUROLOGIC:  His gait is using a walker.  He shuffles with  increased base support.  No limb ataxia noted.   His bilateral finger-nose-finger testing shows no evidence of ataxia but is  very slow.  He has decreased fine motor motion, bilateral upper extremities.  He has intact sensation, bilateral upper and lower extremities.  His deep  tendon reflexes are hyperreflexic bilaterally.  He had decreased range of  motion in the right greater than left hip in internal-external rotation as  well as mild range of motion problems with flexion and abduction of his  shoulders.  He has a positive impingement test of the left subacromial area  as well as crossed adduction pain at the [facility] joint.  When going from supine  to sit, he has severe lower extremity extensor spasms and worse on the left  than on the right.  In addition, he has adductor spasm of the left adductor  longus and has pain over that muscle with resisted abduction.   IMPRESSION:  1.  Cervical myelopathy, cervical spinal cord injury, incomplete.  He has      lower extremity spasticity that may have gotten worse since his recent      stroke.  2.  Low back pain does not appear to change with movement of  the lumbar      spine.  He does have tightness and some spasm in the lumbar spine, and I      think this may be more spasticity-related.  3.  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  4.  Somnolence.  I believe this may be related to medication.  Neurontin      with some renal insufficiency may be contributing.   PLAN:  1.  Will discontinue his Neurontin.  He is just taking it once or maybe      twice a day.  2.  I will set him up for a left obturator nerve block with phenol.  3.  Do a right shoulder subacromial bursa injection and consider AC joint      injection.  4.  Will shy away from oral agents given his history of somnolence and      weakness with Baclofen and Neurontin.  Continue a botulinum toxin      injection, lower extremity quads as well as low back.      [name], M.D.  Electronically Signed     AEK/MedQ  D:  11/06/2005 16:44:25  T:  11/07/2005 06:39:09  Job #:  [id number]   cc:   Kerby Nora, MD  Fax: [phone]   Marolyn Hammock. Thad Ranger, M.D.  Fax: [phone]   Donalee Citrin, M.D.  Fax: [phone]

## 2010-10-25 NOTE — H&P (Signed)
NAME:  John [name], John [name] NO.:  [id number]   MEDICAL RECORD NO.:  [id number]          PATIENT TYPE:  INP   LOCATION:  3022                         FACILITY:  [facility]   PHYSICIAN:  Asencion Partridge, M.D.     DATE OF BIRTH:  03-30-1956   DATE OF ADMISSION:  10/16/2005  DATE OF DISCHARGE:  10/22/2005                                HISTORY & PHYSICAL   CHIEF COMPLAINT:  Diarrhea.   HISTORY OF PRESENT ILLNESS:  Mr. Arment is a 55 year old African-American  male with 3 days' history of watery, profuse, and continuous diarrhea with  three episodes of bloody stools today, about 2 ounces of blood in each,  feeling progressively weaker during the last couple of days.  Denies nausea  or vomiting, has been afebrile.  He called the [facility] at 3  p.m. today and he was instructed to come to the ED for further evaluation.  There has not been any sick contacts, no recent antibiotic use, no recent  travel or dining out.  The patient does not have any relatives assisting to  day care, working in day care centers.   The patient also complaining of left-side weakness noticed by physical  therapy 3 weeks ago.  He has history of prior CVA.  Neurologist was notified  and scheduled MRI for today at 3 p.m. and a follow-up visit.  Findings on  MRI/MRA shows subacute in nature.   REVIEW OF SYSTEMS:  CARDIOVASCULAR:  Denies chest pain, palpitations, or  shortness of breath.  GENITOURINARY:  Denies dysuria.  NEUROLOGIC:  The  patient usually ambulates with a cane secondary to muscle  cramps/contractures.  He has got unsteady gait and left side weakness.   PROBLEM LIST:  1.  Diabetes mellitus type 2 with complications.  2.  Hyperlipidemia.  3.  Systemic hypertension.  4.  Diabetic neuropathy.  5.  Ataxia or abnormality of gait.  6.  Osteoarthritis in lower extremities.  7.  Organic impotence.  8.  Obesity.   MEDICATION LIST:  1.  Amlodipine/statin 10/40 p.o. b.i.d.  2.  Aspirin  81 mg p.o. daily.  3.  Enalapril 5 mg p.o. daily.  4.  Metoprolol 50 mg twice a day.  5.  Neurontin 300 mg b.i.d.  6.  Nexium 40 mg daily.  7.  Humalog insulin sliding scale.  8.  Lantus 45 units daily.  9.  Valium 5 mg b.i.d. p.r.n. muscle cramps.  10. Percocet 5/325 take one tablet q.6h. p.r.n.  11. Plavix 75 mg daily.  The patient not currently taking this drug.   ALLERGIES:  No known drug allergies.   OTHER PAST MEDICAL HISTORY:  1.  Peptic ulcer disease in 1984.  2.  MRI of the spine performed in 2003, C3-4, C4-5, C5-6 cervical      myelopathy.  The patient is status post discectomy.  3.  Lower extremity weakness and gait disturbance secondary to spinal cord      damage.   FAMILY HISTORY:  Father died at age 8, had diabetes and coronary artery  disease, CABG x3.  Mother with diabetes mellitus, coronary artery disease,  MI at age 31.   SOCIAL HISTORY:  The patient smokes half pack per day of cigarettes, no  alcohol or drug consumption.  Used to work as a Hospital doctor for [employer],  now disabled.  The patient is married, has no children.   PHYSICAL EXAMINATION:  VITAL SIGNS:  Temperature 99.8, heart rate 119, blood  pressure 124/77, respiratory rate 20, O2 97% on room air.  This was at 4  p.m.  Vital signs retaken at 10:55 p.m. showed temperature 98, heart rate  105, blood pressure 153/90, respiratory rate 18, O2 93% on room air.  GENERAL APPEARANCE:  No acute distress.  MENTAL STATUS:  Normal.  HEENT:  Normocephalic, atraumatic.  Right pupil equal, reactive to light and  accommodation.  Left eye prosthesis.  Right eye __________.  Ears normal.  Mouth:  Dry mucous membranes.  LUNGS:  Clear to auscultation bilaterally.  CARDIOVASCULAR:  S1, S2 present.  Mild tachycardia.  No murmur, rubs, or  gallops.  ABDOMEN:  Mild tenderness in right upper quadrant with positive bowel  sounds.  No guarding, no rebound.  EXTREMITIES:  Peripheral pulses palpable, no edema.   GENITAL/RECTAL:  Perirectal skin excoriation, decreased sphincter tone, no  evidence of external or internal hemorrhoids.  NEUROLOGIC:  Shows cranial nerves II-XII intact.  Strength 4/5 on right  grip, 5/5 on the rest of examination.  Deep tendon reflexes 2+ bilaterally.  Gait slow gait, ataxic.   LABORATORY TESTS:  White blood cells 15.5, hemoglobin 13.5, hematocrit 39.3,  platelets 194 with an absolute neutrophil count of 12.  Calcium 9.1, sodium  142, potassium 3.9, chloride 113, CO2 23, BUN 19, creatinine 1.2, glucose  157, AST 18, ALT 19, alkaline phosphatase 160, bilirubin 0.4, total protein  6.2, albumin 3.6.  PT 13.6, INR 1.  Fecal occult blood positive.  Urinalysis  pending.   Chest x-ray shows stable borderline cardiomegaly and mild chronic bronchitic  changes, no acute findings, normal bowel gas pattern without free peritoneal  air.   MEDICATIONS RECEIVED AT EMERGENCY ROOM:  Normal saline at 100 mL/hour.   ASSESSMENT AND PLAN:  1.  Acute bloody diarrhea, possible viral in etiology.  Differential      diagnosis:  Bacterial diarrhea.  The patient afebrile.  Diarrhea big      volume, watery, no mucus.  Secondary bleeding now mixed with stool.  No      recent travel, no sick contacts.  Also, C. difficile but the patient no      recent hospitalization, no antibiotic therapy.  We will check for      possible community acquired.  Also, viral.  This is the most probable      diagnoses with secondary viral colitis.  For differential, also there      may be polyps or internal hemorrhoids or diverticular bleeding.  The      patient never had a screening colonoscopy before or stool checked for      blood.  The patient may need further gastroenterology referral for      assessment.  I will check fecal leukocytes, C. difficile, and routine      culture.  Hold for ova and parasites for now.  Will check if persistent     bloody diarrhea with negative cultures.  Would hold aspirin.  The       patient may need colonoscopy for GI bleeding workup after diarrhea      resolved.  2.  Mild dehydration.  IV fluids plus p.o. fluids encouraged.  Will check      ins and outs.  3.  Mild leukocytosis.  The patient afebrile with mild dehydration.  Will      repeat labs to rule out hemoconcentration.  Will hold antibiotics and      follow clinically.  If progressive abdominal pain, fever,      hemodynamically instable, may blood culture x2 and start empiric      coverage for bacterial diarrhea/diverticulitis.  4.  Hyperglycemia.  Diabetes mellitus type 2.  No good p.o. intake.  Will      keep at sliding scale insulin moderate and restart insulin at      Lantus/Humalog as p.o. advances.  5.  MRI/MRA findings no acute issue.  Will probably inform neurologist in      the morning.  No admitting diagnosis at this morning.  Hold aspirin      secondary to fecal occult blood positive.  6.  Hypertension is stable.  Continue home regimen.  7.  Deep venous thrombosis prophylaxis.  SCDs if tolerated.      Adrian Blackwater, MD    ______________________________  Asencion Partridge, M.D.    IM/MEDQ  D:  10/22/2005  T:  10/22/2005  Job:  [id number]

## 2010-10-25 NOTE — Assessment & Plan Note (Signed)
John [name] is a 55 year old male with cervical myelopathy and spinal cord  injury with spasticity which is related to a fall in December 2005.  He  has had C3-4, C4-5 diskectomy and fusion.  He has had lumbar pain and  had MRI lumbar spine April 2007.   He was last seen by me February 23, 2006.  He is actually doing quite  well. Despite complaining of pain in his back primarily, he is  functioning at a level where his wife can take care of him.  He walks 3-  4 minutes at a time.  He does not drive.  He needs help with transfers  but really this is more like standby assistance.  He uses a walker for  ambulation.  He needs some assistance with dressing and bathing, meal  prep, household duties, and shopping.   REVIEW OF SYSTEMS:  Positive for weakness, numbness, tingling in the  arms and legs, trouble walking as noted, spasm, and depression but no  suicidal thoughts.  Poor appetite.  Denies sweats  and blood sugar  problems.   INTERVAL MEDICAL HISTORY:  Negative.   SOCIAL HISTORY:  Married as noted.   PHYSICAL EXAMINATION:  VITAL SIGNS: Blood pressure 149/72, pulse 97,  respiratory rate 15, O2 saturation 93% on room air.  GENERAL: No acute distress.  NEUROLOGIC: Mood and affect appropriate.  Gait is using a walker.  He  has decreased step length, tends to shuffle.  Oriented x3.  Strength is  4/5 bilateral quads, TA, and gastroc as well as the upper extremities  deltoid, biceps, grip.  He has clonus at the ankles.  His hip adductor  spasticity is well controlled.  He does have some knee extensor  spasticity.   IMPRESSION:  1. Cervical myelopathy.  2. Incomplete spinal cord injury.  3. Lower greater than upper extremity spasticity which is controlled      better; however, still complains of some lower extremity spasm,      particularly with position changes.   PLAN:  1. Increase Zanaflex to 2 mg 4 times a day.  2. Continue Percocet 7.5 mg 3 times a day for pain related to his  neck      as well as his low back.  3. No further injections at this time.  4. I will see him back in 2 months with nursing visit in 1 month.      [name], M.D.  Electronically Signed     AEK/MedQ  D:  06/22/2006 15:20:23  T:  06/22/2006 22:47:55  Job #:  [id number]

## 2010-10-25 NOTE — Discharge Summary (Signed)
NAME:  John [name], John [name] NO.:  [id number]   MEDICAL RECORD NO.:  [id number]          PATIENT TYPE:  INP   LOCATION:  3022                         FACILITY:  [facility]   PHYSICIAN:  Leighton Roach McDiarmid, M.D.DATE OF BIRTH:  March 08, 1956   DATE OF ADMISSION:  10/16/2005  DATE OF DISCHARGE:  10/22/2005                                 DISCHARGE SUMMARY   DISCHARGE DIAGNOSES:  1.  Diarrhea.  2.  Status post cerebrovascular accident.  3.  Right upper quadrant pain.  4.  Hypertension.  5.  Tobacco abuse.  6.  Hypercholesterolemia.  7.  Diabetes.  8.  Acute renal failure.  9.  Urinary hesitancy/retention.   PROCEDURES:  1.  2-D echocardiogram.  2.  Carotid Dopplers.  3.  Angiogram of posterior circulation.   DISCHARGE MEDICATIONS:  1.  Metoprolol 50 mg b.i.d.  2.  Neurontin 300 mg b.i.d.  3.  Diazepam 5 mg b.i.d.  4.  Caduet one tablet b.i.d.  5.  Nexium one tablet daily.  6.  Flomax 0.8 mg daily.  7.  Lantus 55 units daily.  8.  Humalog sliding scale.  9.  Nicotine patch 14 mg daily.  10. Tricor 145 mg daily.  11. Wellbutrin 150 mg x7 days, followed by 300 mg daily.   HOSPITAL COURSE:  The patient is a 55 year old gentleman who presented to  the emergency department with a 3-day history of watery continuous diarrhea  and 3 episodes of bloody stools.  He had also been feeling progressively  weaker during the last couple of days.  The patient denies nausea or  vomiting.  He has been afebrile.  He called his primary care physician on  the day of admission and was instructed to come to the emergency department.  The patient denied having any sick contacts.  No recent antibiotic use and  no recent travel.  The patient also was complaining of left-sided weakness  noted by his physical therapist 3 weeks ago.  His neurologist was notified,  and he was sickle cell for an MRI on the day of admission at 3 p.m.  The MRI  was carried out, and the patient was found to have had a  subacute CVA on  imaging.   PHYSICAL EXAMINATION:  VITAL SIGNS:  On admission, the patient's vital signs  revealed a temperature of 99.8, pulse of 105-119, blood pressure was 124-  153/77-90, respiratory rate was 20, satting 92% on room air.  GENERAL:  He was in no acute distress.  HEENT:  The patient had dry mucous membranes.  LUNGS:  Clear to auscultation bilaterally.  HEART:  Normal sinus rhythm with mild tachycardia.  No murmurs, rubs, or  gallops.  ABDOMEN:  Positive for mild right upper quadrant tenderness.  (The remainder of the physical exam was unremarkable.)   LABORATORY DATA:  His labs at the time of admission revealed WBC of 15.5,  hemoglobin 13.5, hematocrit 39.3, platelets 194.  Sodium 142, potassium 3.9,  chloride 113, bicarbonate 23, BUN 19, creatinine 1.2, glucose 157.  Calcium  was 9.1.  Chest x-ray showed stable  borderline cardiomegaly and mild chronic  bronchitic changes.  No active disease.   HOSPITAL COURSE BY PROBLEM LIST:  1.  Diarrhea.  The patient was admitted secondary to a 3-day history of      profuse watery diarrhea and concerns for dehydration.  Stool samples      were sent, which were negative for Clostridium difficile and negative      for fecal leukocytes.  When hospitalized, the patient had no further      episodes of bloody diarrhea, and the frequency and consistency of the      stools improved without medical intervention.  By the time of discharge,      the patient was diarrhea free.  2.  Status post cerebrovascular accident.  As mentioned, the patient's MRI      findings showed a subacute cerebral infarction.  Based on this, carotid      Dopplers were ordered, and a cardiac echo was obtained.  Neurology was      also consulted in the interest of continuity.  The patient's      echocardiogram showed left ventricular dilation, left ventricular      function at the lower limit of normal, increased left ventricular wall      thickness with  paradoxical motion of the intraventricular septum.  He      has had aortic valve calcification with mild mitral regurgitation, left      atrial dilation and tricuspid regurgitation.  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  Based on the fact      that he was an inpatient, it was determined that he would have an      angiogram of his posterior circulation obtained.  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  It was determined by neurology that no      further work-up or intervention was needed at this time, and the patient      was to be discharged home on Aggrenox.  He is to no longer take his      aspirin or his Plavix.  3.  Right upper quadrant pain.  He has also admitted complaining of right      upper quadrant pain, and this was thought to be secondary to his viral      gastroenteritis.  However, due to the unknown etiology of his pain,      liver function tests were within normal limits, and history of remote      alcohol abuse, the patient had a CT scan performed of his abdomen.  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  At this point, it was determined no further work-up was      necessary; however, the patient was made aware of this finding, and      instructed that if he has persistent right upper quadrant pain,      dizziness, GI bleeding from either above or below, he is to seek medical      attention immediately, as there is some concern and risk of rupture.  At      this point, this  cavernous hemangioma will be followed up with a repeat      CT scan in 3-6 months.  At the time of discharge, the patient's right      upper quadrant pain had resolved, and he had no complaints. 4.  Hypertension.  The patient was admitted  with a known history of      hypertension, which remained stable throughout his hospitalization.  He      was maintained on his home medications of metoprolol and Caduet.  His      pressured remained within normal range, and he was discharged home      without complications.  Due to the fact this is his second CVA, the      patient needs optimal blood pressure control.  5.  Tobacco abuse.  The patient has an extensive smoking history, and while      here in the hospital, he was started on a 14 mg nicotine patch.  The      patient expressed interest in smoking cessation, and, at the rest of his      primary care physician, he was discharged home on a 14 mg patch to be      applied daily.  He was also given Wellbutrin 150 mg daily for the first      7 days followed by 300 mg daily.  The patient was instructed that under      no circumstances he is to be smoking on his nicotine patch, and he      expressed understanding of this.  He will need close follow up with his      primary care physician to chart the progress of his smoking cessation.  6.  Hypercholesterolemia.  The patient had a fasting lipid profile performed      here as an inpatient to evaluate his cholesterol control.  He was found      to have a total cholesterol of 132 and LDL cholesterol of 32 and an HDL      cholesterol of 25 and triglycerides of 175.  Based on this, he is to      continue on his home regimen of Caduet, and based on his elevated      triglycerides and his low HDL, he was started on Tricor 145 mg daily.      The patient was applauded for his good cholesterol control and      encouraged to continue his medications.  7.  Diabetes mellitus.  The patient was admitted with a known history of      diabetes which, according to him, has been poorly controlled.  The      patient's hemoglobin A1C at the time of admission was 10.9, and he      states he takes 50 units of Lantus daily at home.  Here in the hospital,       he was increased to 55 units of Lantus daily, and was continued on his      NovoLog sliding scale.  While hospitalized, he was also given 40 units      of mealtime coverage.  At the time of discharge, the patient was      instructed to increased his home Lantus dose to 55 units daily, and to      continue his Humalog sliding scale each meal.  When instructed to do      this, the patient expressed that he had never been informed to do the  sliding scale at each meal, and he had been performing it just once a      day at 4 o'clock.  The patient was reinstructed on how to use the      Humalog sliding scale, but he will need close follow up with his primary      care physician to obtain better glucose control.  8.  Acute renal failure.  The patient was admitted with a creatinine of 1.2;      however, at the time of discharge, his creatinine had increased to 1.6,      likely secondary to the dye load he received with both his abdominal CT     and angiogram of his posterior circulation.  In light of the increasing      creatinine, the patient's ACE inhibitor was held, and he was instructed      to drink plenty of fluids.  At his follow up visit with his primary care      physician, a repeat creatinine is advised, and at that time a decision      can be made as to whether to restart his enalapril.  9.  Urinary hesitancy and retention.  The patient was complaining of urinary      hesitancy at the beginning of this hospitalization.  Secondary to his      complaint of urinary hesitancy, a bladder scan was obtained, and he had      350 cc of retained urine.  Based on this retention, he was started on      Flomax 0.4 mg daily and titrated to 0.8 mg daily.  His urinary      hesitancy/retention improved, and the patient had no complaints at the      time of discharge.   DISCHARGE LABORATORY:  Discharge labs from Oct 22, 2005 are as follows:  Sodium 144, potassium 4.8, chloride 117, bicarbonate 24,  BUN 24, creatinine  1.6, glucose 155.   FOLLOW UP ITEMS:  1.  The patient is to follow up with his primary care physician, Dr.      Ermalene Searing, at [facility] on Oct 27, 2005 at 8:45 in the      morning.  2.  The patient was instructed to hold his enalapril until his follow up      appointment with his primary care physician.  3.  The patient was instructed to stop taking his aspirin in light of the      addition of Aggrenox in his medication regimen.  4.  The patient was instructed that he is under no circumstances to be      smoking cigarettes while on the nicotine patch.  5.  The patient was instructed by Dr. Thad Ranger, his neurologist, to follow      up as previously scheduled.  6.  The patient's diabetes will need to be regularly closely evaluated in      the outpatient setting to maintain optimal control.      Neena Rhymes, M.D.    ______________________________  Leighton Roach McDiarmid, M.D.    KT/MEDQ  D:  10/22/2005  T:  10/22/2005  Job:  [id number]   cc:   Kerby Nora, MD  Fax: [phone]   Marolyn Hammock. Thad Ranger, M.D.  Fax: [phone]

## 2010-10-25 NOTE — Procedures (Signed)
NAME:  John [name], YBARRA NO.:  [id number]   MEDICAL RECORD NO.:  [id number]          PATIENT TYPE:  REC   LOCATION:  [facility]PC                          FACILITY:  [facility]   PHYSICIAN:  [name], M.D.DATE OF BIRTH:  [birth year]   DATE OF PROCEDURE:  11/24/2005  DATE OF DISCHARGE:                                 OPERATIVE REPORT   PROCEDURE:  Left obturator nerve block with phenol.   INDICATIONS FOR PROCEDURE:  Left hip adductor spasticity with history of  incomplete cervical spinal cord injury.   Informed consent was obtained after describing risks and benefits of the  procedure to the patient.  He elects to proceed.  Patient placed supine on  exam table.  Nursing assistance was utilized.  Appropriate position of lower  extremity.   Adductor longus on the left was palpated, marked, prepped with Betadine,  entered after getting an adductor twitch on service stimulation using EMG  nerve conduction stimulator 1 Hz.  Then a 22-gauge 80 mm needle electrode  inserted under E-Stim guidance.  Proper adductor twitch was obtained,  confirmed with 0.5 mA and then 5% phenol injected x5 mL.  Patient tolerated  the procedure well.      [name], M.D.  Electronically Signed     AEK/MEDQ  D:  11/24/2005 13:30:46  T:  11/25/2005 10:54:25  Job:  [id number]

## 2010-10-25 NOTE — Assessment & Plan Note (Signed)
Monday, May 25, 2006:   HISTORY:  Cervical myelopathy and complete spinal cord injury with  spasticity, improved after adductor block performed March 07, 2006,  on the right side and the left in June of 2007. Complains of some neck  pain and headache pain on this visit. Started some Zanaflex. He is on  the b.i.d. at this point. No dizziness upon standing. He is on Percocet  7.5 t.i.d.   SOCIAL HISTORY:  Married. Lives with his wife. Smokes 3 to 4 cigarettes,  which is down compared to prior.   PHYSICAL EXAMINATION:  Blood pressure is 122/58, pulse is 88,  respiratory rate is 16. O2 sat is 94% on room air.   He has tenderness to palpation in the right upper trap. He has good neck  range of motion and this does not exacerbate his pain. His gait shows  extensor spasms when he first stands up in the lower extremity, but is  able to walk a few steps without an assistive device and sits down on  the chair. He has 4/5 quad strength and this influenced by tone. He has  no clonus at the ankles, but has hyperactive knee reflexes.   IMPRESSION:  1. Cervical myelopathy and complete spinal cord injury, lower greater      than upper extremity spasticity.  2. Myofascial pain syndrome right trapezius.   PLAN:  1. Zanaflex 2 mg three times a day.  2. Continue Percocet 7.5 three times a day.  3. No injection or procedures needed at this time. I will see him back      in 1 to 2 months.      John [name], M.D.  Electronically Signed     AEK/MedQ  D:  05/25/2006 16:11:29  T:  05/25/2006 21:07:38  Job #:  [id number]

## 2010-10-25 NOTE — Discharge Summary (Signed)
NAME:  CAMBRIDGE, DELEO NO.:  [id number]   MEDICAL RECORD NO.:  [id number]          PATIENT TYPE:  OIB   LOCATION:  2899                         FACILITY:  [facility]   PHYSICIAN:  Ricki Rodriguez, M.D.  DATE OF BIRTH:  [birth year]   DATE OF ADMISSION:  05/16/2005  DATE OF DISCHARGE:                                 DISCHARGE SUMMARY   PROCEDURE:  Left heart catheterization, selective coronary angiography, left  ventricular function study.   INDICATIONS FOR PROCEDURE:  This 55 year old black male had chest pain,  abnormal EKG and stress test showing inferior wall hypokinesia.   APPROACH:  Right femoral artery using 4 French sheath and catheters.   COMPLICATIONS:  None.   HEMODYNAMIC DATA:  The left ventricular pressure was 143/19 and aortic  pressure was 136/77.   CORONARY ANATOMY:  The left main coronary artery showed luminal  irregularities.   Left anterior descending coronary artery:  The left anterior descending  coronary artery showed luminal irregularities with a 10 to 20% proximal  stenosis.  The diagonal vessel I was a narrow vessel, diagonal II and III  were unremarkable and diagonal IV was a small vessel.   Left circumflex coronary artery:  The left circumflex coronary artery showed  ostial 40% stenosis, mid vessel 20 to 30% stenosis and distal 20% stenosis.  The ramus branch was small vessel and obtuse marginal branch was  unremarkable.   Right coronary artery:  The right coronary artery was dominant, had luminal  irregularities, had two posterolateral branches with  mild disease and  posterior descending coronary artery was unremarkable.   Left ventriculogram:  The left ventriculogram showed inferior wall moderate  hypokinesia with ejection fraction of 50%.   IMPRESSION:  1.  Mild multivessel native vessel coronary artery disease.  2.  Mild left ventricular systolic dysfunction.   RECOMMENDATIONS:  This patient will be treated medically with  emphasis on  quitting alcohol as soon as possible.      Ricki Rodriguez, M.D.  Electronically Signed     ASK/MEDQ  D:  05/16/2005  T:  05/16/2005  Job:  [id number]

## 2010-10-25 NOTE — Assessment & Plan Note (Signed)
PAIN AND REHABILITATION FOLLOWUP CLINIC NOTE   DATE OF VISIT:  February 03, 2006   SUBJECTIVE:  Mr. Trela returns for followup left obturator nerve block dated  November 24, 2005.  He has completed some physical therapy two to three times a  week for six weeks.  He has some low back pain, history of cervical  myelopathy with incomplete cervical spinal cord injury.  His left shoulder  pain with subacromial bursitis has improved.  He feels that he has not made  much progress in physical therapy.  He is ambulating with a walker.  He had  a fall out of bed per his report.   OBJECTIVE:  VITAL SIGNS:  Blood pressure 139/56, pulse 103, respiratory rate  17, oxygen saturation 95% on room air.  MUSCULOSKELETAL:  Using a walker, he has difficulty with arising, using  mainly his right lower extremity to push him up.  He has evidence of  scissoring with the right leg, scissoring toward the left.   He has 3- strength in the left lower extremity and 4- in the right lower  extremity.  He has no evidence of knee effusion or tenderness to palpation  on either side.  He has decreased hip internal and external rotation  bilaterally and increased adductor tone bilaterally.   IMPRESSION:  Cervical myelopathy with incomplete spinal cord injury and  spasticity.  He does have reduced base support with the scissoring type  gait.  Would benefit from a right obturator nerve block with Phenol.  He  home exercises and I do not think any further formal physical therapy is  needed at this time.  Continue Lidoderm patch for back.  Consider trigger  point injections next visit, should his back pain not improve.      [name], M.D.  Electronically Signed     AEK/MedQ  D:  02/03/2006 13:30:34  T:  02/04/2006 09:41:40  Job #:  [id number]   cc:   Casimiro Needle L. Thad Ranger, M.D.  Fax: [phone]   Devra Dopp, MD

## 2010-10-25 NOTE — Op Note (Signed)
NAME:  John [name], John [name] NO.:  [id number]   MEDICAL RECORD NO.:  [id number]          PATIENT TYPE:  OIB   LOCATION:  2899                         FACILITY:  [facility]   PHYSICIAN:  Donalee Citrin, M.D.        DATE OF BIRTH:  1956-05-20   DATE OF PROCEDURE:  08/23/2004  DATE OF DISCHARGE:                                 OPERATIVE REPORT   PREOPERATIVE DIAGNOSIS:  1.  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  2.  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   POSTOPERATIVE DIAGNOSIS:  1.  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  2.  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   PROCEDURE:  Anterior cervical diskectomy and fusion at C3-4 and C4-5 with 5  mm Life Net wedges and a 40 mm Atlantis vision plate.   SURGEON:  Donalee Citrin, M.D.   ASSISTANTYetta Barre   ANESTHESIA:  General endotracheal.   INDICATIONS:  The patient is a very pleasant 55 year old gentleman who  sustained a work related injury when he fell backwards off the loading bed  of a truck and experienced some neck pain over the last several weeks and  months.  He has had progressive worsening, numbness and tingling,  predominantly on the left side of his body which caused progressive ataxia.  The patient was worked up and saw Dr. Thad Ranger in neurology and underwent  MRI scan of his neck which showed severe spinal stenosis at C3-4 and C4-5  with a spinal cord contusion and edema within the spinal cord.  Due to the  patient's progressive myelopathy and his MRI findings, the patient was  recommended anterior cervical diskectomies and fusion at two levels, C3-4  and C4-5.  We went over the risk factors of surgery with him.  He  understands and  gave informed consent.   DESCRIPTION OF PROCEDURE:  The patient was brought to the OR and general  anesthesia was induced in the supine position with the neck in slight  extension with 5 pounds of Halter traction.  The right side of his neck was  prepped and draped in the usual sterile fashion.  Using a preoperative x-ray  localizing C3-4 disk space, curvilinear incision was made just off the  midline and __________ sternocleidomastoid.  The superficial layer of the  platysma was dissected out and divided longitudinally.  The avascular plane  between the sternocleidomastoid and the strap muscles was developed down to  the __________.  Intraoperative x-ray was taken to confirm localization of  the C4-5 disk space.  Annulotomy was made with the 15 blade scalpel to mark  the disk space and then the longus coli was reflected laterally at both C3-4  and C4-5 and the self-retaining retractors were placed.  Annulotomies  were  then extended to both disk spaces.  Pituitary rongeurs were used to remove  the anterior margin of the annulus.  Then a high speed drill was used to  drill out any interspace to the posterior annulus and posterior osteophytic  complexes.  Then the operating microscope was brought onto the field for  microscopic illumination at C4-5.  The interspace was drilled down and the  large osteophyte coming off the C4-5 vertebral body was drilled down further  and was under bitten with a 1 and 2 mm Kerrison punch to the posterior  longitudinal ligament which was identified and removed in a piecemeal  fashion.  Then the thecal sac was visualized and taking very gentle care not  to displace the thecal sac the remainder of the large osteophyte of the C4-5  vertebral was removed.  Several large fragments of disk which had migrated  posteriorly which were compressing the thecal sac and both proximal C5  neural foramina were identified and opened up.  At the end of the diskectomy  of C4-5,  this was packed with Gelfoam.  Attention was taken to C3-4.  This  procedure was repeated.  There was noted to be a very large osteophyte as  well as disk material compressing the thecal sac, coming off predominantly  the C3 vertebral body.  Again, this was under bitten with a 1-2 mm Kerrison  punch, decompressing the thecal sac and the proximal aspect of the both C4  nerve roots.  At the end of diskectomy at both levels, both the foramina at  C4-5 bilaterally were widely patent.  The wound was then copiously  irrigated.  Meticulous hemostasis was maintained.  The thecal sac was  visualized clearly and noted to be markedly decompressed.  The C4 vertebral  body was noted to be hypermobile, and this was felt to be some underlying  spondylitic instability with may have predisposed to the injury.  Then two 5  mm __________ were sized, selected and inserted.  Then a 40 mm Atlantis  plate was inserted.  Six 13 mm __________ screws were drilled and placed,  also gives excellent purchase.  Set screws tightened down.  The wound was  copiously irrigated.  Meticulous hemostasis was maintained.  The platysma  was closed with 3-0 running interrupted Vicryl, and the skin was closed with  a running 4-0 subcuticular.  Benzoin and Steri-Strips applied.  The patient  went to the recovery room in stable condition.      GC/MEDQ  D:  08/23/2004  T:  08/23/2004  Job:  [id number]

## 2010-10-25 NOTE — Assessment & Plan Note (Signed)
PAIN AND REHABILITATION CLINIC NOTE   DATE OF VISIT:  December 23, 2005.   SUBJECTIVE:  Mr. Portela follows up.  He had a left obturator nerve block with  Phenol dated November 24, 2005.  He had good relief of pain for about four days  and now has had some return of hip pain but this is actually bilateral.  He  does note that he has had less tightness in his left hip.  He has had no  other new medical complications since I last saw him.  He has been attending  physical therapy about once a week, states that he now has Medicaid and can  perhaps get some additional treatment.   He does have a history of cerebrovascular accident, right cerebellar  hemisphere and right subcortical white matter, central pons and cerebral  peduncle.  He has cervical myelopathy with an incomplete cervical spinal  cord injury.   OBJECTIVE:  GENERAL APPEARANCE:  No acute distress.  Mood and affect are  much brighter.  VITAL SIGNS:  Blood pressure is 107/52, pulse 112, respirations 16, oxygen  saturation 93% on room air.  MUSCULOSKELETAL:  He ambulates usually with the walker but he is able to  ambulate without the walker.  He takes widened based for support with small  step lengths.  He has good passive range of his hip adductors.  He does have  limited internal/external rotation bilateral hips.  He has full knee  extension. He has good strength in his grip, deltoids, biceps, triceps and  hip flexor, knee extensor, ankle dorsiflexors.   PLAN:  1.  Will go ahead and restart physical therapy two to three times a week for      six weeks.  I think he is going to need this in order to regain some hip      range of motion and improved gait abnormality.  2.  Continue off Neurontin.  I think he is less sedated now that he is off      of this.  3.  I do not think any further injections are needed at this time.  Would      consider trigger point injections and do not think low back injections      such as facet or epidural's  are really needed.  4.  I will see him back in about a month.  I have also prescribed Lidoderm      patch for pain over his low back area.      [name], M.D.  Electronically Signed     AEK/MedQ  D:  12/23/2005 13:39:19  T:  12/23/2005 16:42:46  Job #:  [id number]   cc:   Casimiro Needle L. Thad Ranger, M.D.  Fax: [phone]

## 2010-10-25 NOTE — Assessment & Plan Note (Signed)
A 55 year old male with cervical myelopathy, spinal cord injury,  spasticity related to a fall in December 2005, status post C3-4, C4-5  diskectomy and fusion.  Has lumbar pain as well.  He had upper extremity  spasms that he notes mainly at night, lower extremity spasms are more  controlled currently.  He has pain in his low back as well as hands and  feet.  His pain is worse with walking, bending, and some other  activities.  Relief with medications is good.  he walks 3 minutes at a  time.  He can climb steps, but very slowly.  He does not drive.  He  walks with a walker.  He needs assistance for dressing, bathing, meal  prep, household duties, and shopping.  He has bowel and bladder control  issues, depression, but no suicidal thoughts.  Appetite is poor.   He has clonus 2 beats at bilateral ankles.  His strength is 4/5  bilateral quadriceps and gastrocnemius.  All 4 extremities intact.  Biceps, triceps, grip.  He is using walker and decreased step length,  but tends to shuffle.  No scissoring noted.   IMPRESSION:  1. Cervical myelopathy.  2. Incomplete spinal cord injury.  3. Spasticity, appears to be more symptomatic in the upper      extremities, at least at night, with nocturnal twitches that could      be myoclonus as well.   PLAN:  1. We will increase Zanaflex 2 mg 3 times a day and 4 mg at night.  2. Continue Percocet 7.5 t.i.d.  3. I will see him back in 3 months.  Pick up prescriptions in between      visits.      [name], M.D.  Electronically Signed     AEK/MedQ  D:  08/18/2006 13:54:03  T:  08/20/2006 10:19:20  Job #:  [id number]   cc:   Donalee Citrin, M.D.  Fax: [phone]   Marolyn Hammock. Thad Ranger, M.D.  Fax: [phone]   Dr. Pattricia Boss

## 2010-12-16 ENCOUNTER — Telehealth: Payer: Self-pay | Admitting: Family Medicine

## 2010-12-16 NOTE — Telephone Encounter (Signed)
Needs refill on Percocet - pls call when ready  [place] OUT THIS WEEK

## 2010-12-17 NOTE — Telephone Encounter (Signed)
The medication refill he called about yesterday is still not ready at the front desk and he is out of his medications.  Would like a call when it is ready.

## 2010-12-18 NOTE — Telephone Encounter (Signed)
Forward to Dr Newton 

## 2010-12-18 NOTE — Telephone Encounter (Signed)
Has been waiting for 3 days for his prescription and is completely out of Percocet.  Would appreciate a call today.

## 2010-12-19 ENCOUNTER — Telehealth: Payer: Self-pay | Admitting: Family Medicine

## 2010-12-19 ENCOUNTER — Encounter: Payer: Self-pay | Admitting: Family Medicine

## 2010-12-19 ENCOUNTER — Ambulatory Visit (INDEPENDENT_AMBULATORY_CARE_PROVIDER_SITE_OTHER): Payer: Medicare Other | Admitting: Family Medicine

## 2010-12-19 VITALS — BP 149/76 | HR 108 | Temp 98.0°F | Ht 71.0 in | Wt 207.8 lb

## 2010-12-19 DIAGNOSIS — E875 Hyperkalemia: Secondary | ICD-10-CM

## 2010-12-19 DIAGNOSIS — M549 Dorsalgia, unspecified: Secondary | ICD-10-CM

## 2010-12-19 DIAGNOSIS — J441 Chronic obstructive pulmonary disease with (acute) exacerbation: Secondary | ICD-10-CM

## 2010-12-19 DIAGNOSIS — I10 Essential (primary) hypertension: Secondary | ICD-10-CM

## 2010-12-19 MED ORDER — SODIUM POLYSTYRENE SULFONATE 15 GM/60ML PO SUSP: 15.0000 g | Freq: Every day | ORAL | Status: DC

## 2010-12-19 MED ORDER — OXYCODONE-ACETAMINOPHEN 7.5-325 MG PO TABS: 1.0000 | ORAL_TABLET | Freq: Four times a day (QID) | ORAL | Status: DC | PRN

## 2010-12-19 NOTE — Assessment & Plan Note (Signed)
Patient to continue his lasix and sodium bicarb. Refill his Kayexalate today. He will need a repeat BMP at his next visit to monitor potassium. Also, given his uncontrolled hypertension at the last 2 visits-would consider HCTZ both for its potassium lowering effects as well as BP control. This may allow the patient to stop taking Kayexalate chronically.

## 2010-12-19 NOTE — Progress Notes (Signed)
  Subjective:    Patient ID: John [name], male    DOB: [birth year], 55 y.o.   MRN: [id number]  HPI John [name] is a 55M with multiple medical problems including chronic back pain from a spinal injury in 2005, CKD stage III, history of hyperkalemia who comes in for refills of Percocet and Kayexalate.  He has been having increased pain while out of medicine in his mid back and radiating down to his legs. Pain 9/10 sometimes stabbing sometimes feels like heat. Pain is constant. It was previously alleviated by Percocet but he has little relief with tylenol. Since his initial injury, he has difficulty walking. He sometimes defecates on himself if he cough, sneeze, or overnight. No problems with urination. Some numbness around anus occasionally. These problems are unchanged over the course of the last year except for the acute worsening of his pain without his pain medication.   COPD-Patient smoking 1/4 pack per day. He is continuing to try to cut back. He reports no shortness of breath. He reports coughing spells once every month. He has had symptoms much improved from the COPD exacerbation he experienced earlier this year.   Hyperkalemia/CKD Stage III-patient is followed by a nephrologist. He chronically takes sodium bicarb as well as kayexalate. His last Potassium was 5.6. He is requesting a refill of his kayexalate.   Review of Systems Negative except as noted in HPI.     PMH - Smoking status noted.   DM II with neuropathy HLD Obesity HTN CKD OA LE Chronic Back Pain Hx CVA Hx hyperkalemia COPD  Objective:   Physical Exam General appearance: alert and cooperative Eyes: PEERLA on R, Left eye-prosthetic Throat: lips, mucosa, and tongue normal; teeth and gums normal Back: Patient tender from midback in paraspinal muscles Lungs: clear to auscultation bilaterally Heart: regular rate and rhythm, S1, S2 normal, no murmur, click, rub or gallop Extremities: edema 1+, warm dry Neurologic: Grossly  normal, no saddle anesthesia Gait: Patient walks with cane and appears stable although slow in speed    Assessment & Plan:  John [name] is a 55M with multiple medical problems including chronic back pain from a spinal injury in 2005, CKD stage III, history of hyperkalemia who comes in for refills of Percocet and Kayexalate. 1. Chronic Back Pain-Patient regularly sees Renold Don on a monthly basis for refills of Percocet. He has waited over a month since his last refill. Refill his Percocet today. His Percocet is not to be refilled before August 12th and he understands this.  2. CKD Stage III/hyperkalemia-Patient to continue his lasix and sodium bicarb. Refill his Kayexalate today. He will need a repeat BMP at his next visit to monitor potassium. Also, given his uncontrolled hypertension at the last 2 visits-would consider HCTZ both for its potassium lowering effects as well as BP control. This may allow the patient to stop taking Kayexalate chronically.  3. COPD-Patient does not appear to have SOB or chronic coughing. He has not been taking his Spiriva or Albuterol since his exacerbation several months ago. Unable to locate records of spirometry. I did not refill the patient's spiriva or albuterol as he has not been having any issues. Please follow up at his next visit and refill if needed.  4. HTN-poorly controlled over last 2 visits on max dose amlodipine. Patient may require an additional agent and would consider HCTZ for potassium lowering effect.

## 2010-12-19 NOTE — Assessment & Plan Note (Signed)
Patient does not appear to have SOB or chronic coughing. He has not been taking his Spiriva or Albuterol since his exacerbation several months ago. Unable to locate records of spirometry. I did not refill the patient's spiriva or albuterol as he has not been having any issues. Please follow up at his next visit and refill if needed.

## 2010-12-19 NOTE — Assessment & Plan Note (Signed)
Patient regularly sees Renold Don on a monthly basis for refills of Percocet. He has waited over a month since his last refill. Refill his Percocet today. His Percocet is not to be refilled before August 12th and he understands this.

## 2010-12-19 NOTE — Telephone Encounter (Signed)
Called to follow up with pt about percocet. Most recent visit was 09/17/10. Pt was given percocet # 120 at that time. Pt states medication was supposed to last 3 months. Told pt that we have new policy of coming in for visit for narcotic refills. Pt states that he would be able to be seen for medication. Told pt to make same day appointment for medication. Pt agreeable.

## 2010-12-19 NOTE — Assessment & Plan Note (Signed)
poorly controlled over last 2 visits on max dose amlodipine. Patient may require an additional agent and would consider HCTZ for potassium lowering effect.

## 2010-12-19 NOTE — Patient Instructions (Addendum)
-  Take Percocet every 6 hours as needed for pain. Will not be refilled before August 12th.  -Continue to take Kayexalate as prescribed.

## 2011-01-04 ENCOUNTER — Encounter: Payer: Self-pay | Admitting: Family Medicine

## 2011-01-08 ENCOUNTER — Ambulatory Visit (INDEPENDENT_AMBULATORY_CARE_PROVIDER_SITE_OTHER): Payer: Medicare Other | Admitting: Family Medicine

## 2011-01-08 ENCOUNTER — Encounter: Payer: Self-pay | Admitting: Family Medicine

## 2011-01-08 VITALS — BP 128/73 | HR 88 | Temp 97.5°F | Wt 206.0 lb

## 2011-01-08 DIAGNOSIS — F17200 Nicotine dependence, unspecified, uncomplicated: Secondary | ICD-10-CM

## 2011-01-08 DIAGNOSIS — J441 Chronic obstructive pulmonary disease with (acute) exacerbation: Secondary | ICD-10-CM

## 2011-01-08 DIAGNOSIS — J449 Chronic obstructive pulmonary disease, unspecified: Secondary | ICD-10-CM

## 2011-01-08 DIAGNOSIS — M549 Dorsalgia, unspecified: Secondary | ICD-10-CM

## 2011-01-08 DIAGNOSIS — E875 Hyperkalemia: Secondary | ICD-10-CM

## 2011-01-08 MED ORDER — OXYCODONE-ACETAMINOPHEN 7.5-325 MG PO TABS: 1.0000 | ORAL_TABLET | Freq: Four times a day (QID) | ORAL | Status: DC | PRN

## 2011-01-08 MED ORDER — SODIUM POLYSTYRENE SULFONATE PO POWD: Freq: Once | ORAL | Status: DC

## 2011-01-08 MED ORDER — TIOTROPIUM BROMIDE MONOHYDRATE 18 MCG IN CAPS: 18.0000 ug | ORAL_CAPSULE | Freq: Every day | RESPIRATORY_TRACT | Status: DC

## 2011-01-08 MED ORDER — ALBUTEROL SULFATE HFA 108 (90 BASE) MCG/ACT IN AERS: 2.0000 | INHALATION_SPRAY | Freq: Four times a day (QID) | RESPIRATORY_TRACT | Status: DC | PRN

## 2011-01-08 NOTE — Progress Notes (Signed)
  Subjective:    Patient ID: John [name], male    DOB: June 07, 1956, 54 y.o.   MRN: [id number]  HPI 1.  Shortness of breath:  Not as bad as has been in past.  Is out of his Albuterol inhaler, needs refill. Continues to take John [name], thinks it is helping.  Decreased Albuterol usage since starting this.  No fevers, chills, recent illnesses  2.  Chronic pain:  Persists.  Has been tried on Oxycontin along with chronic Percocet, patient did not do well with this change.  Long-time problem for patient, no changes to meds.  No acute issues.    3.  Kayexalate:  Wants prescription changed.  Takes this for hyperkalemia associated with CKD.  Last physician he saw refilled suspension rather than powder, caused excessive diarrhea, 4 bowel movements in space of 2 hours.  Was on powder previously, would like to be placed back on this.  Only the one episode of diarrhea since starting the liquid dose.    4.  Diabetes:  No adverse effects from medication.  No hypoglycemic events.  No paresthesia or peripheral nerve pain.  Measures blood sugars at home every:   Day or so, states they've been running mid-100s. Lab Results  Component Value Date   HGBA1C 7.9 09/12/2010      Review of Systems See HPI above for review of systems.       Objective:   Physical Exam Gen:  Alert, cooperative patient who appears stated age in no acute distress.  Vital signs reviewed. Neck: No masses or thyromegaly or limitation in range of motion.  No cervical lymphadenopathy.  No JVD Cardiac:  Regular rate and rhythm without murmur auscultated.  Good S1/S2. Pulm:  No real wheezing noted.  Some prolonged expiratory phase.  Good air movement throughout  Abd:  Soft/nondistended/nontender.  Good bowel sounds throughout all four quadrants.  No masses noted.  Ext:  No clubbing/cyanosis/erythema.  No edema noted bilateral lower extremities.          Assessment & Plan:

## 2011-01-11 ENCOUNTER — Encounter: Payer: Self-pay | Admitting: Family Medicine

## 2011-01-11 DIAGNOSIS — J449 Chronic obstructive pulmonary disease, unspecified: Secondary | ICD-10-CM | POA: Insufficient documentation

## 2011-01-11 NOTE — Assessment & Plan Note (Signed)
Refilled monthly Percocet x 3 as per usual.

## 2011-01-11 NOTE — Assessment & Plan Note (Signed)
Doing well with chronic meds, Albuterol.   No changes to regimen currently.

## 2011-01-11 NOTE — Assessment & Plan Note (Signed)
Counseled to quit, especially in light of COPD and claudication symptoms.

## 2011-01-11 NOTE — Assessment & Plan Note (Signed)
Will attempt to switch Kayexalate back to powder rather than suspesion as he does not tolerate this well.  ** Addendum:  Pharmacy called and they do not carry powder in longer.  Cut dose in half and assess toleration.  Will need BMET to check since decreasing dose.

## 2011-01-24 ENCOUNTER — Other Ambulatory Visit: Payer: Self-pay | Admitting: Family Medicine

## 2011-01-24 NOTE — Telephone Encounter (Signed)
Refill request

## 2011-03-02 ENCOUNTER — Other Ambulatory Visit: Payer: Self-pay | Admitting: Family Medicine

## 2011-03-03 NOTE — Telephone Encounter (Signed)
Refill request

## 2011-03-10 LAB — CREATININE, SERUM
Creatinine, Ser: 2.31 — ABNORMAL HIGH
GFR calc Af Amer: 36 — ABNORMAL LOW

## 2011-03-31 ENCOUNTER — Ambulatory Visit (INDEPENDENT_AMBULATORY_CARE_PROVIDER_SITE_OTHER): Payer: Medicare Other | Admitting: Family Medicine

## 2011-03-31 ENCOUNTER — Encounter: Payer: Self-pay | Admitting: Family Medicine

## 2011-03-31 VITALS — BP 155/79 | HR 86 | Temp 98.4°F | Ht 71.0 in | Wt 207.4 lb

## 2011-03-31 DIAGNOSIS — IMO0002 Reserved for concepts with insufficient information to code with codable children: Secondary | ICD-10-CM

## 2011-03-31 DIAGNOSIS — M25569 Pain in unspecified knee: Secondary | ICD-10-CM

## 2011-03-31 DIAGNOSIS — M25562 Pain in left knee: Secondary | ICD-10-CM

## 2011-03-31 DIAGNOSIS — E119 Type 2 diabetes mellitus without complications: Secondary | ICD-10-CM

## 2011-03-31 DIAGNOSIS — M549 Dorsalgia, unspecified: Secondary | ICD-10-CM

## 2011-03-31 DIAGNOSIS — E1165 Type 2 diabetes mellitus with hyperglycemia: Secondary | ICD-10-CM

## 2011-03-31 DIAGNOSIS — E118 Type 2 diabetes mellitus with unspecified complications: Secondary | ICD-10-CM

## 2011-03-31 LAB — BASIC METABOLIC PANEL
BUN: 47 mg/dL — ABNORMAL HIGH (ref 6–23)
CO2: 21 mEq/L (ref 19–32)
Calcium: 8.7 mg/dL (ref 8.4–10.5)
Creat: 2.5 mg/dL — ABNORMAL HIGH (ref 0.50–1.35)
Glucose, Bld: 123 mg/dL — ABNORMAL HIGH (ref 70–99)

## 2011-03-31 MED ORDER — OXYCODONE-ACETAMINOPHEN 7.5-325 MG PO TABS: 1.0000 | ORAL_TABLET | Freq: Four times a day (QID) | ORAL | Status: DC | PRN

## 2011-03-31 NOTE — Patient Instructions (Signed)
We will check your blood levels today. Take the Lantus 30 units a day and Humolog 10 units at lunch and dinner.   Come back and see me sometime in December so we can recheck your A1C at that time If your knee is hurting you again call and let me know, we might need to send you to Sports Medicine at that time.

## 2011-04-02 ENCOUNTER — Telehealth: Payer: Self-pay | Admitting: Family Medicine

## 2011-04-02 DIAGNOSIS — E875 Hyperkalemia: Secondary | ICD-10-CM

## 2011-04-02 DIAGNOSIS — M25562 Pain in left knee: Secondary | ICD-10-CM | POA: Insufficient documentation

## 2011-04-02 NOTE — Telephone Encounter (Signed)
Called and spoke with John [name] about his potassium.  This is long-standing problem for him and he is not taking his Kayexalate.  He is to resume this twice a day and add Lasix twice daily as well.  To have this rechecked on Monday.  Gave warnings about chest pain and shortness of breath.  Patient will call back for lab appt.  I will put in future order now.

## 2011-04-02 NOTE — Assessment & Plan Note (Signed)
Unclear etiology.   Meniscal tear possibility despite negative exam and no further occurrences. Patient to watch for any further symptoms or problems with locking/popping/giving way.   To return if this occurs.  Would possibly benefit from [facility] U/S if so.

## 2011-04-02 NOTE — Progress Notes (Signed)
  Subjective:    Patient ID: John [name], male    DOB: [birth year], 55 y.o.   MRN: [id number]  HPI 1.  Left knee pain:  Last week (Tuesday evening) patient arose during night to use restroom.  Felt "pop" in back of knee and knee gave way.  Patient able to rise almost immediately.  Some pain in knee.  Used restroom and returned to bed.  Next morning, occurred again.  Some residual pain in back of knee for rest of day.  Since then, no further knee pain.  No knee stiffness.  No locking/popping/giving way of either knee.  Patient here just to make sure everything is okay.  No history of swelling in knee/Baker's cyst/or anything like this previously.   Review of Systems See HPI above for review of systems.       Objective:   Physical Exam Gen:  Alert, cooperative patient who appears stated age in no acute distress.  Vital signs reviewed. MSK:  BL knees:  Normal to inspection.  No tenderness on palpation.  No crepitus.  Full active and passive range of motion of knees and hips, limited only by patient's usual low back pain.  Lachmann's negative, McMurray's negative, anterior/posterior drawer negative.  No bruising/discoloration noted.  No evidence of swelling/effusion/Baker's cyst.        Assessment & Plan:

## 2011-04-02 NOTE — Assessment & Plan Note (Signed)
Usual 3 month refill Percocet

## 2011-04-07 ENCOUNTER — Other Ambulatory Visit: Payer: Medicare Other

## 2011-04-07 DIAGNOSIS — E875 Hyperkalemia: Secondary | ICD-10-CM

## 2011-04-07 LAB — BASIC METABOLIC PANEL
Calcium: 8.2 mg/dL — ABNORMAL LOW (ref 8.4–10.5)
Creat: 3.04 mg/dL — ABNORMAL HIGH (ref 0.50–1.35)

## 2011-04-07 NOTE — Progress Notes (Signed)
BMP DONE TODAY John [name] 

## 2011-04-08 ENCOUNTER — Ambulatory Visit: Payer: Medicare Other | Admitting: Family Medicine

## 2011-04-14 ENCOUNTER — Telehealth: Payer: Self-pay | Admitting: Family Medicine

## 2011-04-14 NOTE — Telephone Encounter (Signed)
Forwarded to pcp.John [name]  

## 2011-04-14 NOTE — Telephone Encounter (Signed)
John [name] was told by pharmacy that if he got MD to write rx for needles that insurance would possibly cover them. He came by to see if Dr. Gwendolyn Grant could write a rx for needles.

## 2011-04-17 MED ORDER — INSULIN SYRINGES (DISPOSABLE) U-100 1 ML MISC: Status: DC

## 2011-04-20 ENCOUNTER — Other Ambulatory Visit: Payer: Self-pay | Admitting: Family Medicine

## 2011-04-21 NOTE — Telephone Encounter (Signed)
Refill request

## 2011-05-08 ENCOUNTER — Other Ambulatory Visit: Payer: Self-pay | Admitting: Emergency Medicine

## 2011-05-08 ENCOUNTER — Emergency Department (HOSPITAL_COMMUNITY)
Admission: EM | Admit: 2011-05-08 | Discharge: 2011-05-09 | Disposition: A | Discharge status: Home / Self Care | Payer: Medicare Other | Attending: Emergency Medicine | Admitting: Emergency Medicine

## 2011-05-08 ENCOUNTER — Encounter (HOSPITAL_COMMUNITY): Payer: Self-pay | Admitting: *Deleted

## 2011-05-08 DIAGNOSIS — Z79899 Other long term (current) drug therapy: Secondary | ICD-10-CM | POA: Insufficient documentation

## 2011-05-08 DIAGNOSIS — E119 Type 2 diabetes mellitus without complications: Secondary | ICD-10-CM | POA: Insufficient documentation

## 2011-05-08 DIAGNOSIS — Z794 Long term (current) use of insulin: Secondary | ICD-10-CM | POA: Insufficient documentation

## 2011-05-08 DIAGNOSIS — R197 Diarrhea, unspecified: Secondary | ICD-10-CM | POA: Insufficient documentation

## 2011-05-08 DIAGNOSIS — J4489 Other specified chronic obstructive pulmonary disease: Secondary | ICD-10-CM | POA: Insufficient documentation

## 2011-05-08 DIAGNOSIS — N183 Chronic kidney disease, stage 3 unspecified: Secondary | ICD-10-CM | POA: Insufficient documentation

## 2011-05-08 DIAGNOSIS — R42 Dizziness and giddiness: Secondary | ICD-10-CM | POA: Insufficient documentation

## 2011-05-08 DIAGNOSIS — I129 Hypertensive chronic kidney disease with stage 1 through stage 4 chronic kidney disease, or unspecified chronic kidney disease: Secondary | ICD-10-CM | POA: Insufficient documentation

## 2011-05-08 DIAGNOSIS — E785 Hyperlipidemia, unspecified: Secondary | ICD-10-CM | POA: Insufficient documentation

## 2011-05-08 DIAGNOSIS — E875 Hyperkalemia: Secondary | ICD-10-CM

## 2011-05-08 DIAGNOSIS — Z7982 Long term (current) use of aspirin: Secondary | ICD-10-CM | POA: Insufficient documentation

## 2011-05-08 DIAGNOSIS — F17200 Nicotine dependence, unspecified, uncomplicated: Secondary | ICD-10-CM | POA: Insufficient documentation

## 2011-05-08 DIAGNOSIS — J449 Chronic obstructive pulmonary disease, unspecified: Secondary | ICD-10-CM | POA: Insufficient documentation

## 2011-05-08 DIAGNOSIS — R112 Nausea with vomiting, unspecified: Secondary | ICD-10-CM | POA: Insufficient documentation

## 2011-05-08 LAB — COMPREHENSIVE METABOLIC PANEL
ALT: 18 U/L (ref 0–53)
AST: 18 U/L (ref 0–37)
Albumin: 3.7 g/dL (ref 3.5–5.2)
Albumin: 3.5 g/dL (ref 3.5–5.2)
Alkaline Phosphatase: 254 U/L — ABNORMAL HIGH (ref 39–117)
BUN: 51 mg/dL — ABNORMAL HIGH (ref 6–23)
CO2: 22 mEq/L (ref 19–32)
CO2: 21 mEq/L (ref 19–32)
Calcium: 8.1 mg/dL — ABNORMAL LOW (ref 8.4–10.5)
Chloride: 109 mEq/L (ref 96–112)
Creatinine, Ser: 2.34 mg/dL — ABNORMAL HIGH (ref 0.50–1.35)
Creatinine, Ser: 2.41 mg/dL — ABNORMAL HIGH (ref 0.50–1.35)
GFR calc Af Amer: 34 mL/min — ABNORMAL LOW (ref >90)
GFR calc non Af Amer: 29 mL/min — ABNORMAL LOW (ref >90)
GFR calc non Af Amer: 28 mL/min — ABNORMAL LOW (ref >90)
Glucose, Bld: 96 mg/dL (ref 70–99)
Sodium: 146 mEq/L — ABNORMAL HIGH (ref 135–145)
Total Bilirubin: 0.3 mg/dL (ref 0.3–1.2)
Total Protein: 6.9 g/dL (ref 6.0–8.3)

## 2011-05-08 LAB — CBC
HCT: 42.7 % (ref 39.0–52.0)
Hemoglobin: 14.1 g/dL (ref 13.0–17.0)
MCV: 90.3 fL (ref 78.0–100.0)
RDW: 13.7 % (ref 11.5–15.5)
WBC: 13.9 10*3/uL — ABNORMAL HIGH (ref 4.0–10.5)

## 2011-05-08 LAB — URINALYSIS, ROUTINE W REFLEX MICROSCOPIC
Glucose, UA: NEGATIVE mg/dL
Hgb urine dipstick: NEGATIVE
Leukocytes, UA: NEGATIVE
Protein, ur: >300 mg/dL — AB
pH: 5.5 (ref 5.0–8.0)

## 2011-05-08 LAB — URINE MICROSCOPIC-ADD ON

## 2011-05-08 LAB — LIPASE, BLOOD: Lipase: 61 U/L — ABNORMAL HIGH (ref 11–59)

## 2011-05-08 MED ORDER — DEXTROSE 50 % IV SOLN: 25.0000 g | Freq: Once | INTRAVENOUS | Status: DC

## 2011-05-08 MED ORDER — SODIUM CHLORIDE 0.9 % IV BOLUS (SEPSIS): 1000.0000 mL | Freq: Once | INTRAVENOUS | Status: DC

## 2011-05-08 MED ORDER — INSULIN ASPART 100 UNIT/ML ~~LOC~~ SOLN
SUBCUTANEOUS | Status: AC
  Administered 2011-05-08: 100 [IU]
  Filled 2011-05-08: qty 10

## 2011-05-08 MED ORDER — ONDANSETRON HCL 4 MG/2ML IJ SOLN
4.0000 mg | Freq: Once | INTRAMUSCULAR | Status: AC
  Administered 2011-05-08: 4 mg via INTRAVENOUS
  Filled 2011-05-08: qty 2

## 2011-05-08 MED ORDER — SODIUM CHLORIDE 0.9 % IV BOLUS (SEPSIS)
1000.0000 mL | Freq: Once | INTRAVENOUS | Status: AC
  Administered 2011-05-08: 1000 mL via INTRAVENOUS

## 2011-05-08 MED ORDER — INSULIN REGULAR HUMAN 100 UNIT/ML IJ SOLN: 10.0000 [IU] | Freq: Once | INTRAMUSCULAR | Status: DC

## 2011-05-08 MED ORDER — INSULIN REGULAR HUMAN 100 UNIT/ML IJ SOLN
10.0000 [IU] | Freq: Once | INTRAMUSCULAR | Status: DC
  Filled 2011-05-08: qty 0.1

## 2011-05-08 MED ORDER — DEXTROSE 50 % IV SOLN
25.0000 g | Freq: Once | INTRAVENOUS | Status: AC
  Administered 2011-05-08: 25 g via INTRAVENOUS
  Filled 2011-05-08: qty 50

## 2011-05-08 MED ORDER — SODIUM POLYSTYRENE SULFONATE 15 GM/60ML PO SUSP: 15.0000 g | Freq: Once | ORAL | Status: DC

## 2011-05-08 NOTE — ED Provider Notes (Signed)
History     CSN: [id number] Arrival date & time: 05/08/2011 12:09 PM   First MD Initiated Contact with Patient 05/08/11 1713      Chief Complaint  Patient presents with  . Emesis    (Consider location/radiation/quality/duration/timing/severity/associated sxs/prior treatment) HPI Patient presents with complaint of multiple episodes of emesis and watery diarrhea which began this morning approximately 4 AM. He states he was in his usual state of health last night before going to bed and the symptoms began acutely. He denies any abdominal pain but does have some rumbling sensation in his stomach prior to emesis or diarrhea stools. Emesis is nonbloody and nonbilious. There's no blood or mucus in his stools. He also complains of feeling lightheaded. He's had no syncope. No fever or chills. No difficulty breathing. He has no sick contacts at home. His last meal was Dione Plover last night. There no other associated systemic symptoms.  Past Medical History  Diagnosis Date  . COPD (chronic obstructive pulmonary disease)   . Chronic back pain     Chronic narcotics for this for many years  . Diabetes mellitus   . Chronic kidney disease     CKD stage III  . Hyperlipidemia   . Hypertension     History reviewed. No pertinent past surgical history.  History reviewed. No pertinent family history.  History  Substance Use Topics  . Smoking status: Current Everyday Smoker -- 0.5 packs/day for 40 years    Types: Cigarettes  . Smokeless tobacco: Never Used   Comment: Previous 2 ppd smoker Durwin Nora)  . Alcohol Use: No     Previous drinker - However, quit in 2006      Review of Systems ROS reviewed and otherwise negative except for mentioned in HPI  Allergies  Review of patient's allergies indicates no known allergies.  Home Medications   Current Outpatient Rx  Name Route Sig Dispense Refill  . ALBUTEROL SULFATE HFA 108 (90 BASE) MCG/ACT IN AERS Inhalation Inhale 2 puffs into the lungs  every 6 (six) hours as needed for wheezing. 1 Inhaler 3  . AMLODIPINE BESYLATE 10 MG PO TABS Oral Take 10 mg by mouth daily.     . ASPIRIN 81 MG PO TBEC Oral Take 81 mg by mouth daily.      Marland Kitchen BACLOFEN 10 MG PO TABS       . VITAMIN D3 1000 UNITS PO CAPS Oral Take 1,000 Units by mouth daily. 1 pill once daily    . FUROSEMIDE 40 MG PO TABS       . INSULIN GLARGINE 100 UNIT/ML Leslie SOLN Subcutaneous Inject 30 Units into the skin daily.      . INSULIN LISPRO (HUMAN) 100 UNIT/ML [place] SOLN Subcutaneous Inject 10 Units into the skin 2 (two) times daily before lunch and supper. 10 mL 3  . OXYCODONE-ACETAMINOPHEN 7.5-325 MG PO TABS Oral Take 1 tablet by mouth every 6 (six) hours as needed for pain. Do not fill before 06/09/10 120 tablet 0  . SIMVASTATIN 80 MG PO TABS       . SODIUM BICARBONATE 650 MG PO TABS Oral Take 1,300 mg by mouth 2 (two) times daily.     . SODIUM POLYSTYRENE SULFONATE 15 GM/60ML PO SUSP Oral Take 15 g by mouth daily.      Marland Kitchen TIOTROPIUM BROMIDE MONOHYDRATE 18 MCG IN CAPS Inhalation Place 1 capsule (18 mcg total) into inhaler and inhale daily. 30 capsule 2    BP 137/85  Pulse 99  Temp(Src) 98.1 F (36.7 C) (Oral)  Resp 18  SpO2 98% Vitals reviewed Physical Exam Physical Examination: General appearance - alert, well appearing, and in no distress Mental status - alert, oriented to person, place, and time Mouth - mucous membranes moist, pharynx normal without lesions Chest - clear to auscultation, no wheezes, rales or rhonchi, symmetric air entry Heart - normal rate, regular rhythm, normal S1, S2, no murmurs, rubs, clicks or gallops Abdomen - soft, nontender, nondistended, no masses or organomegaly, NABS Musculoskeletal - no joint tenderness, deformity or swelling Extremities - peripheral pulses normal, no pedal edema, no clubbing or cyanosis Skin - normal coloration and turgor, no rashes, no suspicious skin lesions noted  ED Course  Procedures (including critical care  time)  Labs Reviewed  URINALYSIS, ROUTINE W REFLEX MICROSCOPIC - Abnormal; Notable for the following:    Protein, ur >300 (*)    All other components within normal limits  CBC - Abnormal; Notable for the following:    WBC 13.9 (*)    All other components within normal limits  COMPREHENSIVE METABOLIC PANEL - Abnormal; Notable for the following:    BUN 51 (*)    Creatinine, Ser 2.34 (*)    Calcium 8.3 (*)    Alkaline Phosphatase 254 (*)    GFR calc non Af Amer 29 (*)    GFR calc Af Amer 34 (*)    All other components within normal limits  LIPASE, BLOOD - Abnormal; Notable for the following:    Lipase 61 (*)    All other components within normal limits  COMPREHENSIVE METABOLIC PANEL - Abnormal; Notable for the following:    Sodium 146 (*)    Potassium 6.3 (*)    BUN 51 (*)    Creatinine, Ser 2.41 (*)    Calcium 8.1 (*)    Alkaline Phosphatase 225 (*)    GFR calc non Af Amer 28 (*)    GFR calc Af Amer 33 (*)    All other components within normal limits  URINE MICROSCOPIC-ADD ON - Abnormal; Notable for the following:    Casts HYALINE CASTS (*) GRANULAR CAST   All other components within normal limits  BASIC METABOLIC PANEL   No results found.   1. Nausea vomiting and diarrhea   2. Hyperkalemia    7:00 PM- report given to [facility] in CDU to continue with current management    MDM  Patient presenting with acute onset of vomiting and diarrhea today. His labs revealed hyperkalemia and renal insufficiency at his baseline. His nausea and vomiting have much improved after Zofran. He has received IV hydration. His hyperkalemia was treated with insulin and glucose. His EKG was obtained and had no acute findings. He was seen by family medicine in the ED who is his primary Dr. Quincy Carnes state he has had a history of hyperkalemia in the past and has been noncompliant with his Kayexalate. They plan to follow up with patient tomorrow morning. His potassium was rechecked prior to his  discharge.        Ethelda Chick, MD 05/09/11 [phone]

## 2011-05-08 NOTE — ED Provider Notes (Signed)
7:08 PM Patient is in CDU holding for labs, UA, IVF, possible [place] given lab results.  Lab has called and requests repeat CMP as initial sample has hemolyzed.    7:51 PM Patient seen and examined.  Pt states he is feeling much better now, reports he is hungry and would like to eat.  Pt to remain NPO until CMP has resulted and decision made about RUQ [place].  On exam, pt is A&O, NAD, RRR, CTAB, abd soft, nondistended, nontender, no guarding, no rebound.    9:12 PM Discussed results with Dr Karma Ganja.  Plan is for admission to hospital, EKG, treatment for hyperkalemia.     9:54 PM Family Practice (PCP) to come see the patient.    11:25 PM [facility] has seen patient, would like to give pt fluids, kayexalate, recheck potassium in the morning.  Discussed with Dr Karma Ganja, who would prefer they check K prior to d/c to make sure that it is improving.  [facility] agrees.  Orders and further management by Janelli Welling [facility].    11:56 PM Discussed patient with Dr Patria Mane who assumes care at change of shift.  Plan is for redraw of potassium.  If improving, patient may be d/c home with follow up appointment with [facility] tomorrow.   Dillard Cannon [place], [place] 05/08/11 [phone]

## 2011-05-08 NOTE — ED Notes (Signed)
Vomiting since this am at 0430. Last meal was yesterday at 4pm at [facility]. No family members are sick.

## 2011-05-08 NOTE — H&P (Signed)
Family Medicine Teaching [facility] History and Physical  Patient name: John [name] Medical record number: [id number] Date of birth: [birth year] Age: 55 y.o. Gender: male  Primary Care Provider: Renold Don, MD  Chief Complaint: n/v/d, hyperkalemia incidental  History of Present Illness: John [name] is a 55 y.o. year old male with history of CKD, hyperkalemia, diabetes, and COPD presenting with a 1 day history of nausea, vomiting, and diarrhea who was found to have a potassium of 6.3.  The GI symptoms started suddenly this morning at 4am.  Denies abdominal pain, fevers.  Does describe one episode of cold sweats that resolved.  Has been unable to keep solid food down since last night.  Does endorse some dizziness with sitting/standing this morning.  Called [facility], but did not have any appointments available and advised to go to ED or urgent care.  Last emesis around 5pm, last BM around 11am.  During our interview and evaluation tonight, the patient is currently feeling better; tolerating clear liquids; denies any further dizziness.  Denies any chest pain, palpitations. He is supposed to take kayexalate BID at home for hyperkalemia; he states he takes it maybe once a month due to excessive diarrhea.  He does take NaHCO3 BID as prescribed.  States he usually can tell when he potassium goes up due to nausea and he'll take the kayexalate then, but was unable to hold anything down.    Received a 1L NS bolus and dextrose with insulin in the ED prior to our interview.   Patient Active Problem List  Diagnoses  . NEUROPATHY, DIABETIC  . DIABETES MELLITUS, II, COMPLICATIONS  . HYPERLIPIDEMIA  . OVERWEIGHT  . TOBACCO DEPENDENCE  . HYPERTENSION, BENIGN SYSTEMIC  . CLAUDICATION, INTERMITTENT  . CHRONIC KIDNEY DISEASE STAGE III (MODERATE)  . IMPOTENCE, ORGANIC  . OSTEOARTHRITIS, LOWER LEG  . BACK PAIN, CHRONIC  . GAIT, ABNORMAL  . CEREBROVASCULAR ACCIDENT, HX OF  . HYPERKALEMIA  . COPD (chronic  obstructive pulmonary disease)  . Left knee pain   Past Medical History: Past Medical History  Diagnosis Date  . COPD (chronic obstructive pulmonary disease)   . Chronic back pain     Chronic narcotics for this for many years  . Diabetes mellitus   . Chronic kidney disease     CKD stage III  . Hyperlipidemia   . Hypertension     Past Surgical History: History reviewed. No pertinent past surgical history.  Social History: History   Social History  . Marital Status: Married    Spouse Name: N/A    Number of Children: N/A  . Years of Education: N/A   Social History Main Topics  . Smoking status: Current Everyday Smoker -- 0.5 packs/day for 40 years    Types: Cigarettes  . Smokeless tobacco: Never Used   Comment: Previous 2 ppd smoker Durwin Nora)  . Alcohol Use: No     Previous drinker - However, quit in 2006  . Drug Use: No  . Sexually Active: No   Other Topics Concern  . None   Social History Narrative  . None    Family History: History reviewed. No pertinent family history.  Allergies: No Known Allergies  Current Facility-Administered Medications  Medication Dose Route Frequency Provider Last Rate Last Dose  . dextrose 50 % solution 25 g  25 g Intravenous Once Rise Patience, PA   25 g at 05/08/11 2139  . insulin aspart (novoLOG) 100 UNIT/ML injection        100  Units at 05/08/11 2139  . insulin regular (HUMULIN R,NOVOLIN R) 100 units/mL injection 10 Units  10 Units Intravenous Once Rise Patience, PA      . ondansetron [facility]) injection 4 mg  4 mg Intravenous Once Ethelda Chick, MD   4 mg at 05/08/11 1749  . sodium chloride 0.9 % bolus 1,000 mL  1,000 mL Intravenous Once Ethelda Chick, MD   1,000 mL at 05/08/11 1747  . sodium chloride 0.9 % bolus 1,000 mL  1,000 mL Intravenous Once Ethelda Chick, MD      . sodium chloride 0.9 % bolus 1,000 mL  1,000 mL Intravenous Once Despina Hick, MD      . DISCONTD: dextrose 50 % solution 25 g  25 g Intravenous Once Rise Patience, [place]      . DISCONTD: insulin regular (HUMULIN R,NOVOLIN R) 100 units/mL injection 10 Units  10 Units Intravenous Once Rise Patience, PA       Current Outpatient Prescriptions  Medication Sig Dispense Refill  . albuterol (PROVENTIL HFA;VENTOLIN HFA) 108 (90 BASE) MCG/ACT inhaler Inhale 2 puffs into the lungs every 6 (six) hours as needed for wheezing.  1 Inhaler  3  . amLODipine (NORVASC) 10 MG tablet Take 10 mg by mouth daily.       Marland Kitchen aspirin 81 MG EC tablet Take 81 mg by mouth daily.        . baclofen (LIORESAL) 10 MG tablet        . Cholecalciferol (VITAMIN D3) 1000 UNITS CAPS Take 1,000 Units by mouth daily. 1 pill once daily      . furosemide (LASIX) 40 MG tablet        . insulin glargine (LANTUS) 100 UNIT/ML injection Inject 30 Units into the skin daily.        . insulin lispro (HUMALOG) 100 UNIT/ML injection Inject 10 Units into the skin 2 (two) times daily before lunch and supper.  10 mL  3  . oxyCODONE-acetaminophen (PERCOCET) 7.5-325 MG per tablet Take 1 tablet by mouth every 6 (six) hours as needed for pain. Do not fill before 06/09/10  120 tablet  0  . simvastatin (ZOCOR) 80 MG tablet        . sodium bicarbonate 650 MG tablet Take 1,300 mg by mouth 2 (two) times daily.       . sodium polystyrene (KAYEXALATE) 15 GM/60ML suspension Take 15 g by mouth daily.        Marland Kitchen tiotropium (SPIRIVA HANDIHALER) 18 MCG inhalation capsule Place 1 capsule (18 mcg total) into inhaler and inhale daily.  30 capsule  2  . DISCONTD: insulin glargine (LANTUS) 100 UNIT/ML injection Inject 30 Units into the skin daily.  10 mL  3   Review Of Systems: Per HPI with the following additions: back and leg pain than is unchanged from baseline Otherwise 12 point review of systems was performed and was unremarkable.  Physical Exam: Pulse: 99 (up to 110 when standing)  Blood Pressure: 137/85 RR: 18   O2: 98 on RA Temp: 98.1  General: alert, cooperative, appears stated age, no distress and moderately obese HEENT:  PERRLA, extra ocular movement intact, sclera clear, anicteric and mucous membranes appear slightly dry; prosthetic left eye; dentures Heart: S1, S2 normal, no murmur, rub or gallop, regular rate and rhythm Lungs: clear to auscultation, no wheezes or rales and unlabored breathing Abdomen: abdomen is soft without significant tenderness, masses, organomegaly or guarding Extremities: extremities normal, atraumatic, no  cyanosis or edema Skin:no rashes Neurology: normal without focal findings, mental status, speech normal, alert and oriented x3, PERLA and gait a little unsteady, but near baseline per wife's report  Labs and Imaging:  Lab 05/08/11 2321 05/08/11 1923 05/08/11 1735  NA 145 146* 143  K 5.3* 6.3* MARKED HEMOLYSIS  CL 112 112 109  CO2 22 21 22   BUN 51* 51* 51*  CREATININE 2.41* 2.41* 2.34*  LABGLOM -- -- --  GLUCOSE 191* -- --  CALCIUM 7.7* 8.1* 8.3*    Lab Results  Component Value Date   WBC 13.9* 05/08/2011   HGB 14.1 05/08/2011   HCT 42.7 05/08/2011   MCV 90.3 05/08/2011   PLT 165 05/08/2011   UA: protein >300 otherwise normal  ECG: nsr, QTc 450, no peaked t-waves  Assessment and Plan: John [name] is a 55 y.o. year old male with diabetes, CKD Stage III, and chronic hyperkalemia presenting with improving n/v/d and hyperkalemia  1. N/V/D: Likely secondary to viral gastroenteritis as symptoms appear to be improving within 24 hours.  Food poisoning is also possible, but seems less likely.  Given 1.5 L NS bolus in ED, no symptomatic orthostasis on exam. Currently feeling improved and well enough to go home with his wife.  Tolerating clear liquids in the ED and no nausea or emesis in past 6 hours.   2. Hyperkalemia: Patient chronically runs in the mid to high 5 range for potassium.  He is non-compliant with his medications at home.  Given acute presentation of diarrhea, his potassium was likely even higher at home.  Received dextrose and insulin in the ED to move potassium  intracellularly.  ECG was negative for peaked T waves.  Will recheck Bmet for trend.  Patient remains resistant to taking daily kayexelate due to frequent stooling.  Repeat K was 5.3.  3. Dehydration: Likely secondary to n/v/d.  S/p 1L NS bolus in ED.  Will give another liter now.  Has been tolerating some clear liquids in the ED.   4. CKD: Creatinine has in the 2-2.3 range in the last year.  Creatinine today appears to be close to baseline at 2.41.  Given his clinical dehydration and BUN/Cr ratio, this will likely come down after his fluid boluses.  5. DM, type II: On Lantus 30, NovoLog 10 with lunch and dinner at home.  States he only takes these if his blood sugar is high.  HbA1c in 03/2011 was 8.9.    6. COPD: Stable; will continue his home medications  7. Chronic back/leg pain: Has not received pain medication today.  As he is likely going home, will hold off on giving any percocet here.  Patient is aware and okay with this plan.  8. Hypertension: Stable; continue home medications  Disposition: Discussed options with patient of staying overnight for fluids and recheck of K in the morning vs home tonight with follow up/lab draw in [facility] clinic tomorrow. Patient would prefer to go home tonight.  Discussed with Dr. Deirdre Priest who is agreeable with discharge from the ED after additional NS bolus.  An order was placed for bmet in [facility] tomorrow 11/30.  John [name], ERIN 05/08/2011, 11:13 PM   I have seen and examined patient with Dr. Elwyn Reach in the ED tonight, agree with above physical exam, labs, assessment and plan.  This is a 55 yo male with history of DM, stage III CKD and chronic hyperkalemia who presented to ED for ~8 hours of acute onset N/V/D starting early this  am. Unfortunately for the patient he has spent nearly 12 hours in ED today. During this time his symptoms of diarrhea and emesis have resolved; however, he was found to have elevated serum potassium, most likely due to noncompliance with  kayexelate.   1. Gastroenteritis. Symptoms resolved. Likely mild dehydration with symptomatic orthostasis improved after 2 L IVF boluses. No fever or pain concerning for more worrisome etiologies.  2. Hyperkalemia. Acute on chronic elevation of potasium related to CKD. No notable EKG findings today. No worsening of baseline creatinine or signs of ARF. Has had values ranging from 5-6.5 on clinic testing previously. Has chronic loose stools therefore is noncompliant with kayexelate. Takes sodium bicarbonate and lasix daily. Potassium improved to 5.3 after insulin tonight. Strongly encouraged to resume kayexelate at home tomorrow and follow up in clinic in am.   3. Other plans as stated above. Will have close clinic follow up in am for repeat potassium check. Case d/w Dr. Deirdre Priest and Dr. Karma Ganja in ED.   Mauri Tolen 05/09/2011 12:06 AM

## 2011-05-08 NOTE — ED Notes (Signed)
Patient states began having diarrhea, nausea and light headed at 0400. Denies previous episodes.  Denies any pain at this time

## 2011-05-08 NOTE — ED Provider Notes (Signed)
Medical screening examination/treatment/procedure(s) were conducted as a shared visit with non-physician practitioner(s) and myself.  I personally evaluated the patient during the encounter  Ethelda Chick, MD 05/08/11 [phone]

## 2011-05-09 ENCOUNTER — Encounter: Payer: Self-pay | Admitting: Family Medicine

## 2011-05-09 ENCOUNTER — Ambulatory Visit (INDEPENDENT_AMBULATORY_CARE_PROVIDER_SITE_OTHER): Payer: Medicare Other | Admitting: Family Medicine

## 2011-05-09 VITALS — BP 158/81 | HR 88 | Temp 98.3°F | Ht 71.0 in | Wt 203.0 lb

## 2011-05-09 DIAGNOSIS — E875 Hyperkalemia: Secondary | ICD-10-CM

## 2011-05-09 LAB — BASIC METABOLIC PANEL
CO2: 22 mEq/L (ref 19–32)
CO2: 23 mEq/L (ref 19–32)
Calcium: 7.7 mg/dL — ABNORMAL LOW (ref 8.4–10.5)
Calcium: 7.6 mg/dL — ABNORMAL LOW (ref 8.4–10.5)
Chloride: 112 mEq/L (ref 96–112)
Creatinine, Ser: 2.41 mg/dL — ABNORMAL HIGH (ref 0.50–1.35)
GFR calc Af Amer: 33 mL/min — ABNORMAL LOW (ref >90)
Potassium: 4.7 mEq/L (ref 3.5–5.3)
Sodium: 145 mEq/L (ref 135–145)
Sodium: 144 mEq/L (ref 135–145)

## 2011-05-09 NOTE — Patient Instructions (Signed)
Keep taking the Kayexalate daily.  You can use divided doses (2-4 times a day) and see if this helps prevent the diarrhea. Make sure to not eat a bunch of bananas or orange juice.  These things can cause your potassium to go up. Make an appointment sometime before Christmas to see the folks at [facility] to see if they have any suggestions. We will check your potassium today.  Come back in 1 week so we can make sure it's still staying low.

## 2011-05-09 NOTE — H&P (Signed)
I discussed Mr Smiths history and presentation with Dr Cristal Ford.   I agree with her plan

## 2011-05-10 NOTE — Assessment & Plan Note (Signed)
Check potassium today. Check creatinine. He's been at his baseline of 2.3 the past several objects. Recommended that he needs to followup this month with [facility]. He continues to put this off due to finances. Patient states will followup within this month. Also recommended he continue his Kayexalate. Recommended to divide the dose to 3-4 times a day to hopefully prevent excessive diarrhea he has when he takes a bolus. Also recommended to continue current sodium bicarbonate.

## 2011-05-10 NOTE — ED Provider Notes (Signed)
This patient was discharged home by the consulting/admission team. I did not personally evaluate this patient  Lyanne Co, MD 05/10/11 0002

## 2011-05-10 NOTE — Progress Notes (Signed)
  Subjective:    Patient ID: John [name], male    DOB: 1956/05/31, 56 y.o.   MRN: [id number]  HPI  1.  Hyperkalemia:  Patient seen in the emergency department last night after one-day history of abdominal pain, nausea vomiting, diarrhea. Found to have potassium of 6.3. Given Kayexalate. Recheck her potassium was 5.3. He is returning today to follow up with his PCP.  States he no longer is having any symptoms. Does admit that he stopped taking his Kayexalate due to the profuse diarrhea he has when he takes it. Continues to take 40 mg of Lasix daily. Denies any further nausea or vomiting. No diarrhea. No further abdominal pain. No fevers or chills. States "I feel back to normal." Review of Systems See HPI above for review of systems.       Objective:   Physical Exam Gen:  Alert, cooperative patient who appears stated age in no acute distress.  Vital signs reviewed. .Abd:  Soft/nondistended/nontender.  Good bowel sounds throughout all four quadrants.  No masses noted.  Cardiac:  Regular rate and rhythm without murmur auscultated.  Good S1/S2. Pulm:  Clear to auscultation bilaterally with good air movement.  No wheezes or rales noted.   Ext:  No clubbing/cyanosis/erythema.  No edema noted bilateral lower extremities.          Assessment & Plan:

## 2011-05-16 ENCOUNTER — Encounter: Payer: Self-pay | Admitting: Family Medicine

## 2011-05-16 ENCOUNTER — Ambulatory Visit (INDEPENDENT_AMBULATORY_CARE_PROVIDER_SITE_OTHER): Payer: Medicare Other | Admitting: Family Medicine

## 2011-05-16 VITALS — BP 125/71 | HR 91 | Temp 98.2°F | Ht 71.0 in | Wt 203.5 lb

## 2011-05-16 DIAGNOSIS — E875 Hyperkalemia: Secondary | ICD-10-CM

## 2011-05-16 LAB — BASIC METABOLIC PANEL
CO2: 24 mEq/L (ref 19–32)
Chloride: 110 mEq/L (ref 96–112)
Glucose, Bld: 211 mg/dL — ABNORMAL HIGH (ref 70–99)
Sodium: 143 mEq/L (ref 135–145)

## 2011-05-16 MED ORDER — SODIUM BICARBONATE 650 MG PO TABS: 1300.0000 mg | ORAL_TABLET | Freq: Two times a day (BID) | ORAL | Status: DC

## 2011-05-16 MED ORDER — FUROSEMIDE 40 MG PO TABS: 40.0000 mg | ORAL_TABLET | Freq: Two times a day (BID) | ORAL | Status: DC

## 2011-05-16 MED ORDER — INSULIN GLARGINE 100 UNIT/ML ~~LOC~~ SOLN: 30.0000 [IU] | Freq: Every day | SUBCUTANEOUS | Status: DC

## 2011-05-16 MED ORDER — SIMVASTATIN 80 MG PO TABS: 80.0000 mg | ORAL_TABLET | Freq: Every day | ORAL | Status: DC

## 2011-05-16 MED ORDER — AMLODIPINE BESYLATE 10 MG PO TABS: 10.0000 mg | ORAL_TABLET | Freq: Every day | ORAL | Status: DC

## 2011-05-16 NOTE — Progress Notes (Signed)
  Subjective:    Patient ID: John [name], male    DOB: 1956-04-02, 55 y.o.   MRN: [id number]  HPI 1. Hyperkalemia:  No further symptoms.  Denies any abdominal pain, nausea, vomiting, leg cramps.  Has been taking his Kayexalate daily.  Has been out of sodium bicarb for about 1 week.  Has not received refill from [facility].  4-5 loose stools a day.  No fevers or chills.     Review of Systems See HPI above for review of systems.       Objective:   Physical Exam Gen:  Alert, cooperative patient who appears stated age in no acute distress.  Vital signs reviewed. Abd:  Soft/nondistended/nontender.  Good bowel sounds throughout all four quadrants.  No masses noted.  Pulm:  Clear to auscultation bilaterally with good air movement.  No wheezes or rales noted.   Cardiac:  Regular rate and rhythm without murmur auscultated.  Good S1/S2.        Assessment & Plan:

## 2011-05-16 NOTE — Assessment & Plan Note (Signed)
Last check 4.8, WNL. Patient would like to attempt Kayexalate QOD dosing.   Recommended to continue Lasix at current dosing as well as sodium bicarb. Will need to FU in 2 weeks.  If 2 week dosing will keep him compliant, I'm okay with that, concerned that insistence on daily dosing will prevent him from taking it at all due to excessive number of bowel movements daily, even when he is taking multiple times a day rather than single bolus.

## 2011-05-19 ENCOUNTER — Telehealth: Payer: Self-pay | Admitting: Family Medicine

## 2011-05-19 DIAGNOSIS — E875 Hyperkalemia: Secondary | ICD-10-CM

## 2011-05-19 NOTE — Telephone Encounter (Signed)
Called and discussed lab results with patient. Told about hyperkalemia. Said he needs to continue his Kayexalate and furosemide. Also reiterated the need to see [facility]. Followup one week unit to check for hyperkalemia. Will place future for BMET.

## 2011-05-23 ENCOUNTER — Ambulatory Visit (INDEPENDENT_AMBULATORY_CARE_PROVIDER_SITE_OTHER): Payer: Medicare Other | Admitting: Family Medicine

## 2011-05-23 ENCOUNTER — Other Ambulatory Visit: Payer: Self-pay | Admitting: *Deleted

## 2011-05-23 ENCOUNTER — Encounter: Payer: Self-pay | Admitting: Family Medicine

## 2011-05-23 DIAGNOSIS — E875 Hyperkalemia: Secondary | ICD-10-CM

## 2011-05-23 LAB — BASIC METABOLIC PANEL
BUN: 49 mg/dL — ABNORMAL HIGH (ref 6–23)
CO2: 23 mEq/L (ref 19–32)
Calcium: 8.2 mg/dL — ABNORMAL LOW (ref 8.4–10.5)
Chloride: 106 mEq/L (ref 96–112)
Creat: 2.75 mg/dL — ABNORMAL HIGH (ref 0.50–1.35)

## 2011-05-23 MED ORDER — SODIUM POLYSTYRENE SULFONATE 15 GM/60ML PO SUSP: 15.0000 g | Freq: Every day | ORAL | Status: DC

## 2011-05-23 NOTE — Assessment & Plan Note (Signed)
To labs

## 2011-05-23 NOTE — Progress Notes (Signed)
  Subjective:    Patient ID: John [name], male    DOB: December 18, 1955, 55 y.o.   MRN: [id number]  HPI Mistaken OV.  Patient here for lab appt, NOT for physician OV.  Will simply charge for labs.     Review of Systems     Objective:   Physical Exam        Assessment & Plan:

## 2011-07-15 ENCOUNTER — Encounter: Payer: Self-pay | Admitting: Family Medicine

## 2011-07-15 ENCOUNTER — Ambulatory Visit (INDEPENDENT_AMBULATORY_CARE_PROVIDER_SITE_OTHER): Payer: Medicare Other | Admitting: Family Medicine

## 2011-07-15 DIAGNOSIS — E118 Type 2 diabetes mellitus with unspecified complications: Secondary | ICD-10-CM

## 2011-07-15 DIAGNOSIS — E119 Type 2 diabetes mellitus without complications: Secondary | ICD-10-CM

## 2011-07-15 DIAGNOSIS — E785 Hyperlipidemia, unspecified: Secondary | ICD-10-CM

## 2011-07-15 DIAGNOSIS — E875 Hyperkalemia: Secondary | ICD-10-CM

## 2011-07-15 DIAGNOSIS — E1165 Type 2 diabetes mellitus with hyperglycemia: Secondary | ICD-10-CM

## 2011-07-15 DIAGNOSIS — M549 Dorsalgia, unspecified: Secondary | ICD-10-CM

## 2011-07-15 DIAGNOSIS — I10 Essential (primary) hypertension: Secondary | ICD-10-CM

## 2011-07-15 LAB — COMPREHENSIVE METABOLIC PANEL
Albumin: 4.4 g/dL (ref 3.5–5.2)
Alkaline Phosphatase: 224 U/L — ABNORMAL HIGH (ref 39–117)
BUN: 60 mg/dL — ABNORMAL HIGH (ref 6–23)
Calcium: 8.8 mg/dL (ref 8.4–10.5)
Glucose, Bld: 99 mg/dL (ref 70–99)
Potassium: 5.7 mEq/L — ABNORMAL HIGH (ref 3.5–5.3)

## 2011-07-15 LAB — CBC
Hemoglobin: 13.4 g/dL (ref 13.0–17.0)
RBC: 4.7 MIL/uL (ref 4.22–5.81)

## 2011-07-15 LAB — LIPID PANEL
Cholesterol: 131 mg/dL (ref 0–200)
Total CHOL/HDL Ratio: 4.1 Ratio
Triglycerides: 116 mg/dL (ref <150)
VLDL: 23 mg/dL (ref 0–40)

## 2011-07-15 MED ORDER — OXYCODONE-ACETAMINOPHEN 7.5-325 MG PO TABS: 1.0000 | ORAL_TABLET | Freq: Four times a day (QID) | ORAL | Status: AC | PRN

## 2011-07-15 MED ORDER — OXYCODONE-ACETAMINOPHEN 7.5-325 MG PO TABS: 1.0000 | ORAL_TABLET | Freq: Four times a day (QID) | ORAL | Status: DC | PRN

## 2011-07-15 NOTE — Assessment & Plan Note (Signed)
Seen at [facility] by Dr. Kathrene Bongo since my last visit. His creatinine is relatively stable we are checking another BP med today. No changes per their recommendations.

## 2011-07-15 NOTE — Assessment & Plan Note (Signed)
Right at goal today.

## 2011-07-15 NOTE — Patient Instructions (Signed)
We obtained some lab work today. I will let you know what these results are. Refilled your medications today.

## 2011-07-15 NOTE — Assessment & Plan Note (Signed)
A1c much higher today than previous visit 8.6. Nonadherent to insulin regimen. States he takes his Humalog "as needed." Takes his Lantus only when his blood sugars are high. He states that his blood sugars usually run 115 to 120 fasting in the morning. Unclear how accurate this is with A1c of greater than 10. Emphasized taking Lantus every morning. He denies any current hypoglycemic events but I believe this is secondary to not taking meds regularly.

## 2011-07-15 NOTE — Assessment & Plan Note (Signed)
At baseline. Refill Percocet as per three-month period

## 2011-07-15 NOTE — Assessment & Plan Note (Signed)
Checking potassium today.

## 2011-07-15 NOTE — Progress Notes (Signed)
  Subjective:    Patient ID: John [name], male    DOB: 27-Apr-1956, 56 y.o.   MRN: [id number]  HPI Hypertension:  Long-term problem for this patient.  No adverse effects from medication.  Not checking it regularly.  No HA, CP, dizziness, shortness of breath, palpitations, or LE swelling.   BP Readings from Last 3 Encounters:  07/15/11 130/68  05/23/11 158/76  05/16/11 125/71    Diabetes:  Currently on insulin at home.  No adverse effects from medication.  No hypoglycemic events.  No paresthesias or peripheral nerve pain.  Measures blood sugars at home every:  Few days.    Lab Results  Component Value Date   HGBA1C 8.9 03/31/2011   Chronic Pain Follow-Up Assessment Chronic Pain Diagnosis: chronic back pain secondary to injury sustained on the job. Pain scale rating at its BEST in the past 24 hours (1 to 10): 5 Pain scale rating at its WORST in the past 24 hours (1 to 10): 9 Adverse Effects:  (None_x_ Nausea__ Vomiting__ Confusion__ Sleepiness__ Fatigue__ Constipation__ Other__) Treatment of Adverse Effects: n/a Since the last clinic visit, how much relief have pain treatment and medication provided? Please circle the one percentage that shows how much relief you have received: 0%__ 10%__ 20%__ 30%__ 40%__ 50%_x_ 60%__ 70%__ 80%__ 90%__ 100%__ Loraine Leriche the one number that describes how, during the past 24 hours, pain has interfered with your: General Activity 0__ 1__ 2__ 3__ 4__ 5__ 6_x_ 7__ 8__ 9__ 10__ Does not interfere      Completely interferes Mood 0__ 1__ 2__ 3__ 4__ 5__ 6_x_ 7__ 8__ 9__ 10__ Does not interfere      Completely interferes Ability to work (in or out of the home) 0__ 1__ 2__ 3__ 4__ 5__ 6__ 7__ 8__ 9__ 10_x_ Does not interfere      Completely interferes Interactions with other people 0__ 1__ 2__ 3__ 4__ 5__ 6__ 7_x_ 8__ 9__ 10__ Does not interfere      Completely interferes Sleep 0__ 1__ 2__ 3__ 4__ 5__ 6_x_ 7__ 8__ 9__ 10__ Does not interfere      Completely  interferes  Enjoyment of life 0__ 1__ 2__ 3__ 4__ 5__ 6_x_ 7__ 8__ 9__ 10__ Does not interfere      Completely interferes  The following portions of the patient's history were reviewed and updated as appropriate: allergies, current medications, past medical history, family and social history, and problem list, including fact patient is a smoker.  Review of Systems No urinary incontinence, bowel incontinence, saddle anesthesia.       Objective:   Physical Exam Gen:  Alert, cooperative patient who appears stated age in no acute distress.  Vital signs reviewed.  Patient ambulates with cane Neck:  No Thryomegaly Cardiac:  Regular rate and rhythm without murmur auscultated.  Good S1/S2.  Distal pulses +1 DP, non-palpable PT Pulm:  Clear to auscultation bilaterally with good air movement.  No wheezes or rales noted.   Abdomen: Soft/nondistended/nontender. Good bowel sounds throughout. Extremities: No edema noted bilaterally MSK:  Moderately tender to palpation bilateral lumbar region. Paraspinal muscles tight. Straight leg raise positive for both hamstring and back pain bilaterally. No bony spinous process tenderness Psych:  Not depressed or anxious appearing        Assessment & Plan:

## 2011-07-18 ENCOUNTER — Other Ambulatory Visit: Payer: Self-pay | Admitting: Family Medicine

## 2011-07-18 ENCOUNTER — Telehealth: Payer: Self-pay | Admitting: *Deleted

## 2011-07-18 NOTE — Telephone Encounter (Signed)
Set up appointment 2/12 @ 8:30am with lab

## 2011-07-18 NOTE — Telephone Encounter (Signed)
Message copied by Farrell Ours on Fri Jul 18, 2011 11:43 AM ------      Message from: [place]ellar      Created: Fri Jul 18, 2011 11:29 AM       Can we call [facility] and have him come back next week to retest his kidney function?  It jumped from 2.5 to 2.75 last month to 3 last visit.              Thanks,            Trey Paula

## 2011-07-21 ENCOUNTER — Other Ambulatory Visit: Payer: Medicare Other

## 2011-07-21 DIAGNOSIS — E875 Hyperkalemia: Secondary | ICD-10-CM

## 2011-07-21 LAB — BASIC METABOLIC PANEL
BUN: 64 mg/dL — ABNORMAL HIGH (ref 6–23)
CO2: 24 mEq/L (ref 19–32)
Calcium: 8.8 mg/dL (ref 8.4–10.5)
Chloride: 110 mEq/L (ref 96–112)
Creat: 2.94 mg/dL — ABNORMAL HIGH (ref 0.50–1.35)
Glucose, Bld: 114 mg/dL — ABNORMAL HIGH (ref 70–99)
Potassium: 4.7 mEq/L (ref 3.5–5.3)
Sodium: 145 mEq/L (ref 135–145)

## 2011-07-21 NOTE — Progress Notes (Signed)
Bmp done today John [name] 

## 2011-08-05 ENCOUNTER — Ambulatory Visit (INDEPENDENT_AMBULATORY_CARE_PROVIDER_SITE_OTHER): Payer: Medicare Other | Admitting: Family Medicine

## 2011-08-05 ENCOUNTER — Encounter: Payer: Self-pay | Admitting: Family Medicine

## 2011-08-05 VITALS — BP 138/70 | HR 84 | Temp 98.7°F | Ht 69.5 in | Wt 200.7 lb

## 2011-08-05 DIAGNOSIS — R799 Abnormal finding of blood chemistry, unspecified: Secondary | ICD-10-CM

## 2011-08-05 DIAGNOSIS — M25561 Pain in right knee: Secondary | ICD-10-CM

## 2011-08-05 DIAGNOSIS — R7989 Other specified abnormal findings of blood chemistry: Secondary | ICD-10-CM

## 2011-08-05 DIAGNOSIS — M25569 Pain in unspecified knee: Secondary | ICD-10-CM

## 2011-08-05 LAB — BASIC METABOLIC PANEL
Potassium: 5 mEq/L (ref 3.5–5.3)
Sodium: 146 mEq/L — ABNORMAL HIGH (ref 135–145)

## 2011-08-05 NOTE — Patient Instructions (Signed)
Your knee looks okay. If it is still bothering you in about 4 - 6 weeks or so, come back and see me.   We'll get some bloodwork today. Just one fluid a day (furosemide).
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
## 2011-08-07 ENCOUNTER — Encounter: Payer: Self-pay | Admitting: Family Medicine

## 2011-08-08 DIAGNOSIS — M25562 Pain in left knee: Secondary | ICD-10-CM | POA: Insufficient documentation

## 2011-08-08 NOTE — Assessment & Plan Note (Signed)
Recent acute increase in creatinine to 3. we are checking his BMET again today.

## 2011-08-08 NOTE — Assessment & Plan Note (Signed)
I believe he is simply bruised his knee. Testing infection of his knee and leg are fine. The patient reassurance as well as what to do if he has any acute worsening.

## 2011-08-31 ENCOUNTER — Encounter (HOSPITAL_COMMUNITY): Payer: Self-pay

## 2011-08-31 ENCOUNTER — Other Ambulatory Visit: Payer: Self-pay

## 2011-08-31 ENCOUNTER — Emergency Department (HOSPITAL_COMMUNITY): Payer: Medicare Other

## 2011-08-31 ENCOUNTER — Inpatient Hospital Stay (HOSPITAL_COMMUNITY)
Admission: EM | Admit: 2011-08-31 | Discharge: 2011-09-01 | DRG: 641 | Disposition: A | Discharge status: Home / Self Care | Payer: Medicare Other | Source: Ambulatory Visit | Attending: Family Medicine | Admitting: Family Medicine

## 2011-08-31 DIAGNOSIS — M659 Unspecified synovitis and tenosynovitis, unspecified site: Secondary | ICD-10-CM | POA: Diagnosis present

## 2011-08-31 DIAGNOSIS — IMO0002 Reserved for concepts with insufficient information to code with codable children: Secondary | ICD-10-CM | POA: Diagnosis present

## 2011-08-31 DIAGNOSIS — M79609 Pain in unspecified limb: Secondary | ICD-10-CM

## 2011-08-31 DIAGNOSIS — J4489 Other specified chronic obstructive pulmonary disease: Secondary | ICD-10-CM | POA: Diagnosis present

## 2011-08-31 DIAGNOSIS — N183 Chronic kidney disease, stage 3 unspecified: Secondary | ICD-10-CM | POA: Diagnosis present

## 2011-08-31 DIAGNOSIS — M25569 Pain in unspecified knee: Secondary | ICD-10-CM | POA: Diagnosis present

## 2011-08-31 DIAGNOSIS — M1710 Unilateral primary osteoarthritis, unspecified knee: Secondary | ICD-10-CM | POA: Diagnosis present

## 2011-08-31 DIAGNOSIS — R000 Tachycardia, unspecified: Secondary | ICD-10-CM | POA: Diagnosis present

## 2011-08-31 DIAGNOSIS — Z91199 Patient's noncompliance with other medical treatment and regimen due to unspecified reason: Secondary | ICD-10-CM

## 2011-08-31 DIAGNOSIS — E785 Hyperlipidemia, unspecified: Secondary | ICD-10-CM | POA: Diagnosis present

## 2011-08-31 DIAGNOSIS — Z9119 Patient's noncompliance with other medical treatment and regimen: Secondary | ICD-10-CM

## 2011-08-31 DIAGNOSIS — R296 Repeated falls: Secondary | ICD-10-CM | POA: Diagnosis present

## 2011-08-31 DIAGNOSIS — E1149 Type 2 diabetes mellitus with other diabetic neurological complication: Secondary | ICD-10-CM | POA: Diagnosis present

## 2011-08-31 DIAGNOSIS — I129 Hypertensive chronic kidney disease with stage 1 through stage 4 chronic kidney disease, or unspecified chronic kidney disease: Secondary | ICD-10-CM | POA: Diagnosis present

## 2011-08-31 DIAGNOSIS — E875 Hyperkalemia: Principal | ICD-10-CM | POA: Diagnosis present

## 2011-08-31 DIAGNOSIS — J449 Chronic obstructive pulmonary disease, unspecified: Secondary | ICD-10-CM | POA: Diagnosis present

## 2011-08-31 DIAGNOSIS — E1142 Type 2 diabetes mellitus with diabetic polyneuropathy: Secondary | ICD-10-CM | POA: Diagnosis present

## 2011-08-31 DIAGNOSIS — E663 Overweight: Secondary | ICD-10-CM | POA: Diagnosis present

## 2011-08-31 DIAGNOSIS — Y998 Other external cause status: Secondary | ICD-10-CM

## 2011-08-31 DIAGNOSIS — Z794 Long term (current) use of insulin: Secondary | ICD-10-CM

## 2011-08-31 DIAGNOSIS — F17200 Nicotine dependence, unspecified, uncomplicated: Secondary | ICD-10-CM | POA: Diagnosis present

## 2011-08-31 DIAGNOSIS — Z8673 Personal history of transient ischemic attack (TIA), and cerebral infarction without residual deficits: Secondary | ICD-10-CM

## 2011-08-31 LAB — BASIC METABOLIC PANEL
Calcium: 8.3 mg/dL — ABNORMAL LOW (ref 8.4–10.5)
Creatinine, Ser: 2.54 mg/dL — ABNORMAL HIGH (ref 0.50–1.35)
GFR calc Af Amer: 31 mL/min — ABNORMAL LOW (ref >90)
GFR calc non Af Amer: 27 mL/min — ABNORMAL LOW (ref >90)
Sodium: 141 mEq/L (ref 135–145)

## 2011-08-31 LAB — POCT I-STAT, CHEM 8
BUN: 48 mg/dL — ABNORMAL HIGH (ref 6–23)
Calcium, Ion: 1.11 mmol/L — ABNORMAL LOW (ref 1.12–1.32)
Chloride: 117 mEq/L — ABNORMAL HIGH (ref 96–112)
Creatinine, Ser: 2.7 mg/dL — ABNORMAL HIGH (ref 0.50–1.35)
Glucose, Bld: 107 mg/dL — ABNORMAL HIGH (ref 70–99)

## 2011-08-31 LAB — CBC
Platelets: 157 10*3/uL (ref 150–400)
RBC: 4.22 MIL/uL (ref 4.22–5.81)
RDW: 14.5 % (ref 11.5–15.5)
WBC: 11.2 10*3/uL — ABNORMAL HIGH (ref 4.0–10.5)

## 2011-08-31 LAB — GLUCOSE, CAPILLARY: Glucose-Capillary: 250 mg/dL — ABNORMAL HIGH (ref 70–99)

## 2011-08-31 LAB — POTASSIUM: Potassium: 6.7 mEq/L (ref 3.5–5.1)

## 2011-08-31 LAB — CREATININE, SERUM
Creatinine, Ser: 2.4 mg/dL — ABNORMAL HIGH (ref 0.50–1.35)
GFR calc Af Amer: 33 mL/min — ABNORMAL LOW (ref >90)
GFR calc non Af Amer: 29 mL/min — ABNORMAL LOW (ref >90)

## 2011-08-31 LAB — HEMOGLOBIN A1C: Mean Plasma Glucose: 266 mg/dL — ABNORMAL HIGH (ref <117)

## 2011-08-31 MED ORDER — INSULIN ASPART 100 UNIT/ML ~~LOC~~ SOLN
0.0000 [IU] | SUBCUTANEOUS | Status: DC
  Administered 2011-08-31: 3 [IU] via SUBCUTANEOUS
  Administered 2011-08-31: 1 [IU] via SUBCUTANEOUS
  Administered 2011-09-01: 2 [IU] via SUBCUTANEOUS
  Administered 2011-09-01: 1 [IU] via SUBCUTANEOUS

## 2011-08-31 MED ORDER — BIOTENE DRY MOUTH MT LIQD
15.0000 mL | Freq: Two times a day (BID) | OROMUCOSAL | Status: DC
  Administered 2011-08-31: 15 mL via OROMUCOSAL

## 2011-08-31 MED ORDER — SODIUM BICARBONATE 650 MG PO TABS
1300.0000 mg | ORAL_TABLET | Freq: Two times a day (BID) | ORAL | Status: DC
  Administered 2011-08-31 – 2011-09-01 (×2): 1300 mg via ORAL
  Filled 2011-08-31 (×3): qty 2

## 2011-08-31 MED ORDER — HYDROCODONE-ACETAMINOPHEN 5-325 MG PO TABS
1.0000 | ORAL_TABLET | Freq: Once | ORAL | Status: AC
  Administered 2011-08-31: 1 via ORAL
  Filled 2011-08-31: qty 1

## 2011-08-31 MED ORDER — ASPIRIN 81 MG PO CHEW: 81.0000 mg | CHEWABLE_TABLET | Freq: Every day | ORAL | Status: DC

## 2011-08-31 MED ORDER — INSULIN GLARGINE 100 UNIT/ML ~~LOC~~ SOLN
30.0000 [IU] | Freq: Every day | SUBCUTANEOUS | Status: DC
  Administered 2011-08-31: 30 [IU] via SUBCUTANEOUS

## 2011-08-31 MED ORDER — SODIUM POLYSTYRENE SULFONATE 15 GM/60ML PO SUSP
45.0000 g | Freq: Once | ORAL | Status: AC
  Administered 2011-08-31: 45 g via ORAL
  Filled 2011-08-31: qty 180

## 2011-08-31 MED ORDER — INSULIN ASPART 100 UNIT/ML ~~LOC~~ SOLN
5.0000 [IU] | Freq: Once | SUBCUTANEOUS | Status: AC
  Administered 2011-08-31: 5 [IU] via INTRAVENOUS
  Filled 2011-08-31: qty 1

## 2011-08-31 MED ORDER — ALBUTEROL SULFATE (5 MG/ML) 0.5% IN NEBU
5.0000 mg | INHALATION_SOLUTION | Freq: Once | RESPIRATORY_TRACT | Status: AC
  Administered 2011-08-31: 5 mg via RESPIRATORY_TRACT
  Filled 2011-08-31: qty 1

## 2011-08-31 MED ORDER — DOCUSATE SODIUM 100 MG PO CAPS
100.0000 mg | ORAL_CAPSULE | Freq: Two times a day (BID) | ORAL | Status: DC
  Filled 2011-08-31 (×2): qty 1

## 2011-08-31 MED ORDER — MORPHINE SULFATE 2 MG/ML IJ SOLN
1.0000 mg | INTRAMUSCULAR | Status: DC | PRN
  Administered 2011-08-31 – 2011-09-01 (×3): 1 mg via INTRAVENOUS
  Filled 2011-08-31 (×3): qty 1

## 2011-08-31 MED ORDER — BACLOFEN 10 MG PO TABS
10.0000 mg | ORAL_TABLET | Freq: Two times a day (BID) | ORAL | Status: DC
  Administered 2011-08-31 – 2011-09-01 (×2): 10 mg via ORAL
  Filled 2011-08-31 (×3): qty 1

## 2011-08-31 MED ORDER — ENOXAPARIN SODIUM 30 MG/0.3ML ~~LOC~~ SOLN
30.0000 mg | SUBCUTANEOUS | Status: DC
  Administered 2011-08-31: 30 mg via SUBCUTANEOUS
  Filled 2011-08-31 (×2): qty 0.3

## 2011-08-31 MED ORDER — SODIUM CHLORIDE 0.45 % IV SOLN
INTRAVENOUS | Status: DC
  Administered 2011-08-31 – 2011-09-01 (×2): via INTRAVENOUS

## 2011-08-31 MED ORDER — MORPHINE SULFATE 4 MG/ML IJ SOLN
6.0000 mg | Freq: Once | INTRAMUSCULAR | Status: AC
  Administered 2011-08-31: 6 mg via INTRAVENOUS
  Filled 2011-08-31: qty 1

## 2011-08-31 MED ORDER — AMLODIPINE BESYLATE 10 MG PO TABS
10.0000 mg | ORAL_TABLET | Freq: Every day | ORAL | Status: DC
  Administered 2011-09-01: 10 mg via ORAL
  Filled 2011-08-31: qty 1

## 2011-08-31 MED ORDER — ATORVASTATIN CALCIUM 40 MG PO TABS
40.0000 mg | ORAL_TABLET | Freq: Every day | ORAL | Status: DC
  Filled 2011-08-31: qty 1

## 2011-08-31 MED ORDER — OXYCODONE-ACETAMINOPHEN 5-325 MG PO TABS
1.0000 | ORAL_TABLET | Freq: Once | ORAL | Status: AC
  Administered 2011-08-31: 1 via ORAL
  Filled 2011-08-31: qty 1

## 2011-08-31 MED ORDER — CHLORHEXIDINE GLUCONATE 0.12 % MT SOLN
15.0000 mL | Freq: Two times a day (BID) | OROMUCOSAL | Status: DC
  Filled 2011-08-31 (×4): qty 15

## 2011-08-31 MED ORDER — DEXTROSE 50 % IV SOLN
1.0000 | Freq: Once | INTRAVENOUS | Status: AC
  Administered 2011-08-31: 50 mL via INTRAVENOUS
  Filled 2011-08-31: qty 50

## 2011-08-31 MED ORDER — ASPIRIN EC 81 MG PO TBEC
81.0000 mg | DELAYED_RELEASE_TABLET | Freq: Every day | ORAL | Status: DC
  Administered 2011-08-31 – 2011-09-01 (×2): 81 mg via ORAL
  Filled 2011-08-31 (×2): qty 1

## 2011-08-31 NOTE — ED Notes (Signed)
PA Lawyer aware of Potassium

## 2011-08-31 NOTE — ED Notes (Signed)
Abnormal labs given to [employer] PA

## 2011-08-31 NOTE — ED Notes (Signed)
This RN verified pt's allergies, pt denies any allergies to any medication, just before pt took his medication he states "I usually take Percocet, I don't know if this will give me a reaction." Pt then reports Norco makes him itch. Thayer Ohm EDPA informed, no new orders given

## 2011-08-31 NOTE — ED Notes (Signed)
Admitting at bedside 

## 2011-08-31 NOTE — ED Notes (Signed)
Complains of left lower extremity pain from knee distally onset yesterday pain is constant worse with changing position not made better by anything no treatment prior to coming here I exam patient is alert awake no distress heart regular rate and rhythm abdomen obese nontender both lower extremities without swelling or deformity or tenderness left lower extremity pain at posterior knee on active extension. DP pulses 2+ bilaterally Patient noted to be hyperkalemic  Doug Sou, MD 08/31/11 1318

## 2011-08-31 NOTE — ED Notes (Addendum)
Pt to ED for eval of left knee pain that started approx at 1am, states leg keep giving out on him. Pt moved to POD due to lab work, pt is aware of poc. Spoke with PA Lawyer regarding pain, pt okayed to drink. Pt denies any decrease in urination, chest pain, sob, or any other complaints beside severe left knee pain radiating down into calf. No swelling noted on exam.

## 2011-08-31 NOTE — ED Notes (Signed)
MD at bedside. 

## 2011-08-31 NOTE — ED Provider Notes (Signed)
Medical screening examination/treatment/procedure(s) were conducted as a shared visit with non-physician practitioner(s) and myself.  I personally evaluated the patient during the encounter  Doug Sou, MD 08/31/11 [phone]
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
## 2011-08-31 NOTE — Progress Notes (Signed)
VASCULAR LAB PRELIMINARY  PRELIMINARY  PRELIMINARY  PRELIMINARY  Left lower extremity venous Doppler completed.    Preliminary report:  There is no DVT or SVT noted in the left lower extremity.    John [name], 08/31/2011, 2:04 PM

## 2011-08-31 NOTE — ED Notes (Signed)
Pt with c/o left knee pain in back of knee radiating down the leg, reports fell twice last night, leg gave out, pain 10/10,

## 2011-08-31 NOTE — ED Provider Notes (Signed)
History     CSN: [id number]  Arrival date & time 08/31/11  4098   First MD Initiated Contact with Patient 08/31/11 [phone]      Chief Complaint  Patient presents with  . Knee Pain    (Consider location/radiation/quality/duration/timing/severity/associated sxs/prior treatment) HPI  Past Medical History  Diagnosis Date  . COPD (chronic obstructive pulmonary disease)   . Chronic back pain     Chronic narcotics for this for many years  . Diabetes mellitus   . Chronic kidney disease     CKD stage III  . Hyperlipidemia   . Hypertension     Past Surgical History  Procedure Date  . Neck surgery     History reviewed. No pertinent family history.  History  Substance Use Topics  . Smoking status: Current Everyday Smoker -- 0.5 packs/day for 40 years    Types: Cigarettes  . Smokeless tobacco: Never Used   Comment: Previous 2 ppd smoker Durwin Nora)  . Alcohol Use: No     Previous drinker - However, quit in 2006      Review of Systems  Allergies  Vicodin  Home Medications   Current Outpatient Rx  Name Route Sig Dispense Refill  . AMLODIPINE BESYLATE 10 MG PO TABS Oral Take 10 mg by mouth daily.    . ASPIRIN 81 MG PO CHEW Oral Chew 81 mg by mouth daily.    Marland Kitchen BACLOFEN 10 MG PO TABS Oral Take 10 mg by mouth 2 (two) times daily.     . FUROSEMIDE 40 MG PO TABS Oral Take 40 mg by mouth daily.    . INSULIN GLARGINE 100 UNIT/ML [place] SOLN Subcutaneous Inject 30 Units into the skin daily as needed. Per sliding scale.    . INSULIN LISPRO (HUMAN) 100 UNIT/ML [place] SOLN Subcutaneous Inject 5-8 Units into the skin daily as needed. Per sliding scale.    . OXYCODONE-ACETAMINOPHEN 7.5-325 MG PO TABS Oral Take 1 tablet by mouth every 6 (six) hours as needed. For pain.    Marland Kitchen SIMVASTATIN 80 MG PO TABS Oral Take 1 tablet (80 mg total) by mouth at bedtime. 90 tablet 3  . SODIUM BICARBONATE 650 MG PO TABS Oral Take 2 tablets (1,300 mg total) by mouth 2 (two) times daily. 120 tablet 6  . SODIUM  POLYSTYRENE SULFONATE 15 GM/60ML PO SUSP Oral Take 15 g by mouth every other day.      BP 134/72  Pulse 87  Temp(Src) 98.5 F (36.9 C) (Oral)  Resp 20  SpO2 97%  Physical Exam  ED Course  Procedures (including critical care time)  Labs Reviewed  POCT I-STAT, CHEM 8 - Abnormal; Notable for the following:    Potassium 6.7 (*)    Chloride 117 (*)    BUN 48 (*)    Creatinine, Ser 2.70 (*)    Glucose, Bld 107 (*)    Calcium, Ion 1.11 (*)    Hemoglobin 12.9 (*)    HCT 38.0 (*)    All other components within normal limits  POTASSIUM - Abnormal; Notable for the following:    Potassium 6.7 (*)    All other components within normal limits   Dg Knee Complete 4 Views Left  08/31/2011  *RADIOLOGY REPORT*  Clinical Data: Post fall, now with increasing pain in the left knee  LEFT KNEE - COMPLETE 4+ VIEW  Comparison: None.  Findings: No fracture or dislocation.  Joint spaces are preserved. No chondrocalcinosis.  No joint effusion.  Regional soft tissues are  normal.  IMPRESSION: No fracture.  Original Report Authenticated By: Waynard Reeds, M.D.     No diagnosis found.    MDM  Duplicate note        Doug Sou, MD 08/31/11 1807

## 2011-08-31 NOTE — ED Provider Notes (Signed)
History     CSN: [id number]  Arrival date & time 08/31/11  4098   First MD Initiated Contact with Patient 08/31/11 [phone]      Chief Complaint  Patient presents with  . Knee Pain    (Consider location/radiation/quality/duration/timing/severity/associated sxs/prior treatment) HPI Mr. Kittleson is a 56 yo male with a history of neck surgery in 2005, who presents to the ED today complaining of left knee pain.  Pt states that he has had problems in the past with his knee "giving out," and yesterday it happened to him twice and he fell.  The pain, however, did not occur until 2 am this morning.  He takes medication for chronic back pain, but his normal medications did not help this.  He describes the pain as a cramping pain, right behind his medial knee.  He denies any claudication, SOB, chest pain, changes in vision, sensation changes in his lower or upper extremities on the left side.    Past Medical History  Diagnosis Date  . COPD (chronic obstructive pulmonary disease)   . Chronic back pain     Chronic narcotics for this for many years  . Diabetes mellitus   . Chronic kidney disease     CKD stage III  . Hyperlipidemia   . Hypertension     Past Surgical History  Procedure Date  . Neck surgery     History reviewed. No pertinent family history.  History  Substance Use Topics  . Smoking status: Current Everyday Smoker -- 0.5 packs/day for 40 years    Types: Cigarettes  . Smokeless tobacco: Never Used   Comment: Previous 2 ppd smoker Durwin Nora)  . Alcohol Use: No     Previous drinker - However, quit in 2006      Review of Systems All pertinent positives and negatives reviewed in the history of present illness  Allergies  Review of patient's allergies indicates no known allergies.  Home Medications   Current Outpatient Rx  Name Route Sig Dispense Refill  . AMLODIPINE BESYLATE 10 MG PO TABS Oral Take 10 mg by mouth daily.    . ASPIRIN 81 MG PO CHEW Oral Chew 81 mg by  mouth daily.    Marland Kitchen BACLOFEN 10 MG PO TABS Oral Take 10 mg by mouth 2 (two) times daily.     . FUROSEMIDE 40 MG PO TABS Oral Take 40 mg by mouth daily.    . INSULIN GLARGINE 100 UNIT/ML [place] SOLN Subcutaneous Inject 30 Units into the skin daily as needed. Per sliding scale.    . INSULIN LISPRO (HUMAN) 100 UNIT/ML [place] SOLN Subcutaneous Inject 5-8 Units into the skin daily as needed. Per sliding scale.    . OXYCODONE-ACETAMINOPHEN 7.5-325 MG PO TABS Oral Take 1 tablet by mouth every 6 (six) hours as needed. For pain.    Marland Kitchen SIMVASTATIN 80 MG PO TABS Oral Take 1 tablet (80 mg total) by mouth at bedtime. 90 tablet 3  . SODIUM BICARBONATE 650 MG PO TABS Oral Take 2 tablets (1,300 mg total) by mouth 2 (two) times daily. 120 tablet 6  . SODIUM POLYSTYRENE SULFONATE 15 GM/60ML PO SUSP Oral Take 15 g by mouth every other day.      BP 141/65  Pulse 95  Temp(Src) 98.5 F (36.9 C) (Oral)  Resp 20  SpO2 96%  Physical Exam  Constitutional: He is oriented to person, place, and time. He appears well-developed and well-nourished. No distress.  HENT:  Head: Normocephalic and atraumatic.  Mouth/Throat:  Oropharynx is clear and moist. No oropharyngeal exudate.  Eyes: Pupils are equal, round, and reactive to light.  Neck: Normal range of motion. Neck supple.  Cardiovascular: Normal rate, regular rhythm and normal heart sounds.  Exam reveals no gallop and no friction rub.   No murmur heard. Pulmonary/Chest: Effort normal and breath sounds normal. He has no wheezes. He has no rales.  Musculoskeletal: Normal range of motion. He exhibits tenderness (over left medial posterior knee over hamstring.). He exhibits no edema.  Neurological: He is alert and oriented to person, place, and time. Coordination normal.  Skin: Skin is warm and dry. No rash noted. He is not diaphoretic. No erythema.    ED Course  Procedures (including critical care time)  Labs Reviewed - No data to display No results found.  Pt seen and  assessed. Will order x-ray and labs   The patient has elevated potassium here today. He states he normally has elevated potassium, but not usually to this level.  Patient states that he is feeling somewhat weak and had some tingling in his right foot.  Patient will be given treatment for his elevated potassium and if that is the hospital for further evaluation. MDM  MDM Reviewed: vitals, nursing note and previous chart Reviewed previous: labs Interpretation: labs and x-ray Consults: admitting MD        CRITICAL CARE Performed by: Carlyle Dolly   Total critical care time:30  Critical care time was exclusive of separately billable procedures and treating other patients.  Critical care was necessary to treat or prevent imminent or life-threatening deterioration.  Critical care was time spent personally by me on the following activities: development of treatment plan with patient and/or surrogate as well as nursing, discussions with consultants, evaluation of patient's response to treatment, examination of patient, obtaining history from patient or surrogate, ordering and performing treatments and interventions, ordering and review of laboratory studies, ordering and review of radiographic studies, pulse oximetry and re-evaluation of patient's condition.    Date: 08/31/2011  Rate: 85  Rhythm: normal sinus rhythm  QRS Axis: normal  Intervals: normal  ST/T Wave abnormalities: normal  Conduction Disutrbances:none  Narrative Interpretation:   Old EKG Reviewed: unchanged     Carlyle Dolly, PA-C 08/31/11 1329  Carlyle Dolly, PA-C 08/31/11 1534  Carlyle Dolly, PA-C 08/31/11 1553

## 2011-08-31 NOTE — ED Notes (Signed)
Vascular staff at bedside.  

## 2011-08-31 NOTE — ED Notes (Signed)
Family at bedside. 

## 2011-08-31 NOTE — H&P (Signed)
**Note John-Identified via Obfuscation** Family Medicine Teaching [facility] Admission History and Physical  Patient name: John [name] Medical record number: [id number] Date of birth: Oct 13, 1955 Age: 56 y.o. Gender: male  Primary Care Provider: Renold Don, MD, MD  Chief Complaint: knee pain History of Present Illness: John [name] is a 56 y.o. male presenting with left knee pain starting yesterday at night. He describes the episode as walking and felt like "sharp needles" poking on his right plant and after that  his left knee "gave up". He gently fell on his bottom with no injuries reported. His main complaint is his left knee pain to have mostly resolved with morphine, and Percocet. At ED potassium was found to be 6.7. Patient has history of chronic renal failure and hyperkalemia on the ranges of 5-6. He follows with [facility] with John [name]. His treatment consists on Kayexalate every other day and bicarbonate daily. He takes his bicarbonate but the Kayexalate last dose was last Sunday or Monday(a week. He complains of GI discomfort (diarrhea) when he takes the kayexalate. The patient denies chest pain,  Palpitations, shortness of breath or other symptoms. EKG was normal on ED.We decided to admit for adjusting potassium levels and observation.  Patient Active Problem List  Diagnoses  . NEUROPATHY, DIABETIC  . DIABETES MELLITUS, II, COMPLICATIONS  . HYPERLIPIDEMIA  . OVERWEIGHT  . TOBACCO DEPENDENCE  . HYPERTENSION, BENIGN SYSTEMIC  . CLAUDICATION, INTERMITTENT  . CHRONIC KIDNEY DISEASE STAGE III (MODERATE)  . IMPOTENCE, ORGANIC  . OSTEOARTHRITIS, LOWER LEG  . BACK PAIN, CHRONIC  . GAIT, ABNORMAL  . CEREBROVASCULAR ACCIDENT, HX OF  . HYPERKALEMIA  . COPD (chronic obstructive pulmonary disease)  . Knee pain, right   Past Medical History: Past Medical History  Diagnosis Date  . COPD (chronic obstructive pulmonary disease)   . Chronic back pain     Chronic narcotics for this for many years    . Diabetes mellitus   . Chronic kidney disease     CKD stage III  . Hyperlipidemia   . Hypertension    Past Surgical History: Past Surgical History  Procedure Date  . Neck surgery    Social History: History   Social History  . Marital Status: Married    Spouse Name: N/A    Number of Children: N/A  . Years of Education: N/A   Social History Main Topics  . Smoking status: Current Everyday Smoker -- 0.5 packs/day for 40 years    Types: Cigarettes  . Smokeless tobacco: Never Used   Comment: Previous 2 ppd smoker Durwin Nora)  . Alcohol Use: No     Previous drinker - However, quit in 2006  . Drug Use: No  . Sexually Active: No   Other Topics Concern  . None   Social History Narrative  . None   Family History: History reviewed. No pertinent family history. Allergies: Allergies  Allergen Reactions  . Vicodin (Hydrocodone-Acetaminophen)     itching   Current Facility-Administered Medications  Medication Dose Route Last Dose  . albuterol (PROVENTIL) (5 MG/ML) 0.5% nebulizer solution 5 mg  5 mg Nebulization 5 mg at 08/31/11 1417  . dextrose 50 % solution 50 mL  1 ampule Intravenous 50 mL at 08/31/11 1331  . HYDROcodone-acetaminophen (NORCO) 5-325 MG per tablet 1 tablet  1 tablet Oral 1 tablet at 08/31/11 1003  . insulin aspart (novoLOG) injection 5 Units  5 Units Intravenous 5 Units at 08/31/11 1330  . morphine 4 MG/ML injection 6 mg  6 mg  Intravenous 6 mg at 08/31/11 1327  . oxyCODONE-acetaminophen (PERCOCET) 5-325 MG per tablet 1 tablet  1 tablet Oral 1 tablet at 08/31/11 1054  . sodium polystyrene (KAYEXALATE) 15 GM/60ML suspension 45 g  45 g Oral 45 g at 08/31/11 1500   Medication Sig  . amLODipine (NORVASC) 10 MG tablet Take 10 mg by mouth daily.  Marland Kitchen aspirin 81 MG chewable tablet Chew 81 mg by mouth daily.  . baclofen (LIORESAL) 10 MG tablet Take 10 mg by mouth 2 (two) times daily.   . furosemide (LASIX) 40 MG tablet Take 40 mg by mouth daily.  . insulin glargine  (LANTUS) 100 UNIT/ML injection Inject 30 Units into the skin daily as needed. Per sliding scale.  . insulin lispro (HUMALOG) 100 UNIT/ML injection Inject 5-8 Units into the skin daily as needed. Per sliding scale.  Marland Kitchen oxyCODONE-acetaminophen (PERCOCET) 7.5-325 MG per tablet Take 1 tablet by mouth every 6 (six) hours as needed. For pain.  . simvastatin (ZOCOR) 80 MG tablet Take 1 tablet (80 mg total) by mouth at bedtime.  . sodium bicarbonate 650 MG tablet Take 2 tablets (1,300 mg total) by mouth 2 (two) times daily.  . sodium polystyrene (KAYEXALATE) 15 GM/60ML suspension Take 15 g by mouth every other day.  Marland Kitchen DISCONTD: insulin glargine (LANTUS) 100 UNIT/ML injection Inject 30 Units into the skin daily.  Marland Kitchen DISCONTD: insulin lispro (HUMALOG) 100 UNIT/ML injection Inject 10 Units into the skin 2 (two) times daily before lunch and supper.    Review Of Systems:  Review of Systems  Constitutional: Negative for fever and chills.  Respiratory: Negative.  Negative for cough, shortness of breath and wheezing.   Cardiovascular: Positive for claudication. Negative for chest pain, palpitations and leg swelling.  Gastrointestinal: Negative for nausea, vomiting, diarrhea and constipation.  Genitourinary: Negative.   Musculoskeletal: Positive for joint pain.       Knee pain  Neurological: Positive for tingling, weakness and headaches.  Psychiatric/Behavioral: Negative.      Physical Exam: Filed Vitals:   08/31/11 1150  BP: 134/72  Pulse: 87  Temp:   Resp:    Gen:  NAD HEENT: Moist mucous membranes. 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 WG:[id number] regular  rhythm, no murmurs rubs or gallops PULM: Clear to auscultation bilaterally. No wheezes/rales/rhonchi ABD: Soft, non tender, non distended, normal bowel sounds EXT: No edema, erythema or increase in temperature of left knee. Limited  range of motion more on flexion than extension. Pain on palpation of Left popliteal fossa with not  mass identified on palpation. Right LE: plantar punctual excoriation with some blood present. No edema or erythema, no drainage. Neuro: Alert and oriented x3. No neurologic focalization.   Labs and Imaging: Lab Results  Component Value Date/Time   NA 144 08/31/2011 10:17 AM   K 6.7* 08/31/2011 11:04 AM   CL 117* 08/31/2011 10:17 AM   CO2 24 08/05/2011  2:13 PM   BUN 48* 08/31/2011 10:17 AM   CREATININE 2.70* 08/31/2011 10:17 AM   CREATININE 2.55* 08/05/2011  2:13 PM   GLUCOSE 107* 08/31/2011 10:17 AM   Lab Results  Component Value Date   WBC 11.8* 07/15/2011   HGB 12.9* 08/31/2011   HCT 38.0* 08/31/2011   MCV 91.1 07/15/2011   PLT 167 07/15/2011    Assessment and Plan: John [name] is a 56 y.o.  male admitted with hyperkalemia and left knee pain. 1. Chronic renal failure stage III: Hyperkalemia most likely due to noncompliance. Patient  follows up with John [name]. Last GFR  on November last year was 69. Creatinine baseline is around 3. Today 2.70. CO2 is 24. -Potassium of 6.7, no EKG changes, mild tachycardia. Admit for observation on telemetry. Patient had a dose of Kayexalate on ED, Albuterol nebulizer and also patient on Insulin for his diabetes Will repeat potassium a.m. and reevaluate.  Renal diet, we will consider to consult renal if patient fails to improve.  2. Knee pain: Patient has a history of  intermittent claudication and diabetes neuropathy neuropathy: Has history of of recurrent pain on his both knees X-ray of the knee shows no fractures, lower extremity Dopplers are negative for DVT.  -We'll continue pain management.  3. Diabetes Mellitus: Last A1c on February was 10.6. Patient on insulin Lantus 30 units daily, plus NovoLog coverage from 3-8 units daily. - Renal diet  -Continue Lantus plus sliding scale insulin.  4. Hypertension: On amlodipine and furosemide. We're holding furosemide at this point due to elevation of creatinine. -Continue with amlodipine,  monitor.  5. Hyperlipidemia: Patient on simvastatin,  Last lipid profile on February within normal limits except low HDL. -Will continue home regimen.  6. COPD: No wheezing on exam. Will continue albuterol when necessary.  7. History of CVA: No neurologic focalization.   FEN/GI: Renal diet,  IVF NS 150 ml/h Prophylaxis: Lovenox Disposition: Pending potassium correction.   D. [place] Rolene Arbour PGY-1 FMTS Pager [phone]  PGY 2 ADDENDUM:  I have seen and examined patient with Dr. Aviva Signs and I agree with her assessment and plan.  Briefly, this 56 year old male with CKD, stage 3 presented to ED with left knee pain and a fall after his leg "gave out" last night.  John [name] was able to control pain with Morphine, however patient was found to have elevated K (6.7).  He has a history of chronic hyperkalemia and supposed to be taking Kayexalate 15 mg every other day and Sodium bicarbonate 650 BID.  Patient has not taken Kayexalate for over one week due to loose stool.   He complains of tingling "needle" sensation plantar aspect of right foot.  Denies any chest pain, SOB, palpitations, weakness, nausea or vomiting.  Physical Exam: General: pleasant, in no acute distress HEENT: glass eye on left, mild scleral icterus b/l, mouth clear and moist Heart: tachycardic, no murmur Lungs: CTAB no wheezes, rales, rhonchi Abdomen: mild distension, active BS, non-tender, no masses MSK: limited ROM of left knee in flexion, normal extension; no bony tenderness, effusion, or erythema, pain with flexion, negative straight leg raise Skin: small puncture wound on plantar aspect of right foot, dry skin diffusely  Assessment/Plan: I agree with John [name] assessment and plan.    - Hyperkalemia, patient received one dose of Kayexalate 45 mg in ED.  EKG was normal, no T wave or QRS abnormalities.  Will recheck K at 2000 and again in AM.  Will also treat with insulin.  Continue home dose of sodium bicarb.  Consider renal  consult if K worsens.  Patient's nephrologist is John [name].  No indication for emergent HD at this time. - Knee pain, will give Morphine 1 mg q 4 PRN pain.  Pain likely secondary to OA.  No fracture seen on plain film.  LE dopper negative for DVT.  Will order PT to evaluate. - DM, type 2: Continue home Lantus 30 and SSI - HTN: Resume home Amlodipine 10 mg daily.  Hold Lasix. - Chronic pain: continue Baclofen - PPX: Lovenox daily  -  Dispo: will admit to telemetry.  Pending clinical improvement and correction of hyperkalemia.  John [name],John [name]

## 2011-09-01 DIAGNOSIS — E875 Hyperkalemia: Secondary | ICD-10-CM

## 2011-09-01 DIAGNOSIS — N184 Chronic kidney disease, stage 4 (severe): Secondary | ICD-10-CM

## 2011-09-01 DIAGNOSIS — M79609 Pain in unspecified limb: Secondary | ICD-10-CM

## 2011-09-01 LAB — GLUCOSE, CAPILLARY: Glucose-Capillary: 106 mg/dL — ABNORMAL HIGH (ref 70–99)

## 2011-09-01 LAB — CBC
MCV: 92.2 fL (ref 78.0–100.0)
Platelets: 155 10*3/uL (ref 150–400)
RDW: 14.5 % (ref 11.5–15.5)
WBC: 8.8 10*3/uL (ref 4.0–10.5)

## 2011-09-01 LAB — COMPREHENSIVE METABOLIC PANEL
ALT: 9 U/L (ref 0–53)
AST: 13 U/L (ref 0–37)
Albumin: 2.9 g/dL — ABNORMAL LOW (ref 3.5–5.2)
Chloride: 110 mEq/L (ref 96–112)
Creatinine, Ser: 2.33 mg/dL — ABNORMAL HIGH (ref 0.50–1.35)
Potassium: 5.2 mEq/L — ABNORMAL HIGH (ref 3.5–5.1)
Sodium: 142 mEq/L (ref 135–145)
Total Bilirubin: 0.2 mg/dL — ABNORMAL LOW (ref 0.3–1.2)

## 2011-09-01 MED ORDER — ENOXAPARIN SODIUM 40 MG/0.4ML ~~LOC~~ SOLN
40.0000 mg | SUBCUTANEOUS | Status: DC
  Filled 2011-09-01: qty 0.4

## 2011-09-01 NOTE — Progress Notes (Signed)
Lovenox Adjustment for VTE prophylaxis  OBJECTIVE:  Wt: 94.6 kg SCr: 2.33, CrCl~35-40 ml/min  ASSESSMENT:  56 y.o. M on lovenox for VTE px requiring a dose adjustment for CrCl>30 ml/min  PLAN:  1. Increase Lovenox to 40 mg SQ every 24 hours for VTE px 2. Will continue to follow renal function for any additional adjustments needed  Georgina Pillion, PharmD, BCPS Clinical Pharmacist Pager: [phone] 09/01/2011 3:36 PM

## 2011-09-01 NOTE — H&P (Signed)
I have seen and examined this patient. I have discussed with Dr(s) de la Cruz and [employer].  I agree with their findings and plans as documented in their admission note.  Acute Issues 1. 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  Acute rise in serum potassium level likely precipitated by patient's non-adherence to his daily Kayexelate therapy.   Now in normal range with re-starting his kayexelate therapy  Plan: Continue Kayexelate daily therapy. Consider change to daily flurinef (mineralocorticoid) therapy if patient will not adhere to [facility] therapy.   2. Distal left posterior thigh/posterior knee pain, acute  Left knee "gave out" yesterday twice.  Pain occurred later in the day after the giving out events.   Tender in left posterior popliteal space/ distal posterior thigh. No tenderness along medial or lateral hamstring tendons or insertion sites. Posterior Leftknee Pain with active and active-resisted knee flexion. No pain with knee extension. No knee edema or increased warmth.  Preliminary Left lower extremity venous doppler report no evidence of DVT or SVT.  No mention of bursitis/cysts of popliteal space.  LEFT KNEE - COMPLETE 4+ VIEW Findings: No fracture or dislocation. Joint spaces are preserved.        No chondrocalcinosis. No joint effusion. Regional soft            tissues are normal. No fractures   Differential diagnosis includes Hamstring strain, baker cyst, occult distal femoral fracture/tibiofemoral fracture  Plan: Relative Rest, consider knee extension brace for 2 to 3 days, Ice, analgesia (not NSAID)

## 2011-09-01 NOTE — Progress Notes (Signed)
Pt d/c'd belongings with pt. D/c summary completed. Family with pt. Iv d/c'd. Copy of AVS with pt. VS stable

## 2011-09-01 NOTE — Discharge Summary (Signed)
Physician Discharge Summary   Patient ID: John [name] [id number] 55 y.o. July 23, 1955  Admit date: 08/31/2011  Discharge date and time: 09/01/11  Admitting Physician: Leighton Roach McDiarmid, MD   Discharge Physician: [name], MD  Admission Diagnoses: Hyperkalemia [276.7] FALL LEG PAIN/FOOT PAIN  Discharge Diagnoses:  1. Hyperkalemia - resolved 2. Chronic Kidney Disease stage 3 3. Knee pain 4. DM2 5. Hypertension 6. Hyperlipidemia 7. COPD  Admission Condition: good  Discharged Condition: good  Indication for Admission: hyperkalemia  Hospital Course:  56 y.o. male admitted who presented to the ED with left leg pain for 2-3 days and found on routine lab to be hyperkalemic at 6.7 for which he was admitted.  1. Hyperkalemia: Patient was on Kayexalate at home which he reported not taking due to diarrhea. Potassium was 6.7. There were no EKG changes. Patient was asymptomatic. Patient was given Kayexalate and albuterol treatment. He also received his normal insulin for his diabetes. Repeat potassium later in the day was 5.1. Potassium on discharge was 5.2 with patient's baseline potassium ranging between 5 and 6.  2. Chronic renal failure stage III: Patient's creatinine on admission was 2.7 with a baseline creatinine between 2.5 and 3. Creatinine remained stable on discharge 3. Knee pain:  Patient started having left knee pain 2-3 days before admission, which is what prompted him to come to the hospital. He described it as being localized to the back of his knee, worst with extension and with periods of giving out on him. Knee x-ray showed no fractures and lower extremity doppler did not show any evidence of DVT. Knee pain was thought to be secondary to synovial inflammation from acute osteoarthritis flare. Steroid and lidocaine injection was administered in the hospital and patient had a follow up appointment with sports medicine.  4. Diabetes Mellitus: Last A1c in February was 10.6.  Patient was started on him home lantus 30 units with novolog coverage at mealtime (3-8units) daily. Glucose on discharge ranged between 106 and 250. 5. Hypertension:  Restarted on home amlodipine 10mg  daily with systolic blood pressures in the 130's.  6. Hyperlipidemia: continued on home simvastatin 7. COPD: there was no wheezing on exam. Received albuterol once  Consults: none  Significant Diagnostic Studies:   LEFT KNEE - COMPLETE 4+ VIEW 08/31/11 Comparison: None.  Findings: No fracture or dislocation. Joint spaces are preserved.  No chondrocalcinosis. No joint effusion. Regional soft tissues  are normal.  IMPRESSION:  No fracture.  Lower extremity doppler: negative for DVT  BMET    Component Value Date/Time   NA 142 09/01/2011 0700   K 5.2* 09/01/2011 0700   CL 110 09/01/2011 0700   CO2 20 09/01/2011 0700   GLUCOSE 151* 09/01/2011 0700   BUN 42* 09/01/2011 0700   CREATININE 2.33* 09/01/2011 0700   CREATININE 2.55* 08/05/2011 1413   CALCIUM 8.0* 09/01/2011 0700   CALCIUM 9.0 01/21/2008 0000   GFRNONAA 30* 09/01/2011 0700   GFRAA 34* 09/01/2011 0700    Discharge Exam: Filed Vitals:   08/31/11 1621 08/31/11 2127 09/01/11 0517 09/01/11 0955  BP:  151/77 138/67 153/79  Pulse:  99 89 98  Temp:  97.9 F (36.6 C) 98 F (36.7 C) 98.5 F (36.9 C)  TempSrc:  Oral Oral Oral  Resp:  18 18 17   Height:      Weight:      SpO2: 0% 91% 91% 98%   Patient Vitals for the past 24 hrs:  Urine Occurrence  09/01/11 0500 1  08/31/11 1700 1    Filed Weights   08/31/11 1600  Weight: 208 lb 8.9 oz (94.6 kg)   General appearance: alert, cooperative and no distress Lungs: comfortable work of breathing, no wheezing appreciated, faint lower bibasilar crackles.  Heart: regular rate and rhythm, S1, S2 normal, no murmur, click, rub or gallop Abdomen: soft, non-tender; bowel sounds normal; no masses,  no organomegaly Extremities: small, healed puncture wound on plantar aspect of right foot, no  tenderness to palpation, no erythema or drainage, no sign of infection.  Left knee: limited extension due to pain, some tenderness to palpation along popliteal fossa without any palpable mass, no tenderness along patella, no warmth or erythema.   Disposition: 01-Home or Self Care  Patient Instructions:  Medication List  As of 09/01/2011  6:46 AM   ASK your doctor about these medications         amLODipine 10 MG tablet   Commonly known as: NORVASC   Take 10 mg by mouth daily.      aspirin 81 MG chewable tablet   Chew 81 mg by mouth daily.      baclofen 10 MG tablet   Commonly known as: LIORESAL   Take 10 mg by mouth 2 (two) times daily.      furosemide 40 MG tablet   Commonly known as: LASIX   Take 40 mg by mouth daily.      insulin glargine 100 UNIT/ML injection   Commonly known as: LANTUS   Inject 30 Units into the skin daily as needed. Per sliding scale.      insulin lispro 100 UNIT/ML injection   Commonly known as: HUMALOG   Inject 5-8 Units into the skin daily as needed. Per sliding scale.      oxyCODONE-acetaminophen 7.5-325 MG per tablet   Commonly known as: PERCOCET   Take 1 tablet by mouth every 6 (six) hours as needed. For pain.      simvastatin 80 MG tablet   Commonly known as: ZOCOR   Take 1 tablet (80 mg total) by mouth at bedtime.      sodium bicarbonate 650 MG tablet   Take 2 tablets (1,300 mg total) by mouth 2 (two) times daily.      sodium polystyrene 15 GM/60ML suspension   Commonly known as: KAYEXALATE   Take 15 g by mouth every other day.           Activity: activity as tolerated, use cane or walker to help with ambulation in the setting of osteoarthritis flare.  Diet: renal diet Wound Care: monitor puncture wound on right foot  Follow-up Information    Follow up with BOOTH, ERIN, MD in 4 days. (follow up with Dr. Elwyn Reach (one of Dr. Tyson Alias colleagues) on Thursday March 28th at 8:30am. )    Contact information:   [address] [place] [address] [phone]       Follow up with [facility]. (follow up with Dr. Eulis Foster on Tuesday April 2nd at 3:30pm for your knee)    Contact information:   [address] [place] [address] [phone]        Follow up items: - compliance with kayexelate for hyperkalemia. If patient continues not taking it, flurinef (mineralocorticoid) therapy may be considered. - repeat BMP for K on follow up - knee pain: patient to follow up with sports medicine clinic.  - follow up on puncture wound on right plantar aspect of foot to monitor  for any signs of infection.   SignedMarena Chancy 09/01/2011 6:46 AM

## 2011-09-01 NOTE — Discharge Instructions (Signed)
You were in the hospital because of high potassium, which was treated with Kayexelate here in the hospital.  The high potassium can affect your heart and it is important to keep the potassium at a normal range by taking your regular home kayexelate. It works by excreting the potassium in your stool, so having frequent bowel movements while taking it means that it is working.   And for your left knee pain, it is most likely due to arthritis. The steroid shot that Dr. [name] gave you in the hospital should make it feel better. You have a follow up appointment with sports medicine next week where they will see if they need to do more imaging.    Hyperkalemia Hyperkalemia is when you have too much potassium in your blood. This can be a life-threatening condition. Potassium is normally removed (excreted) from the body by the kidneys. CAUSES  The potassium level in your body can become too high for the following reasons:  You take in too much potassium. You can do this by:   Using salt substitutes. They contain large amounts of potassium.   Taking potassium supplements from your caregiver. The dose may be too high for you.   Eating foods or taking nutritional products with potassium.   You excrete too little potassium. This can happen if:   Your kidneys are not functioning properly. Kidney (renal) disease is a very common cause of hyperkalemia.   You are taking medicines that lower your excretion of potassium, such as certain diuretic medicines.   You have an adrenal gland disease called Addison's disease.   You have a urinary tract obstruction, such as kidney stones.   You are on treatment to mechanically clean your blood (dialysis) and you skip a treatment.   You release a high amount of potassium from your cells into your blood. You may have a condition that causes potassium to move from your cells to your bloodstream. This can happen with:   Injury to muscles or other tissues. Most  potassium is stored in the muscles.   Severe burns or infections.   Acidic blood plasma (acidosis). Acidosis can result from many diseases, such as uncontrolled diabetes.  SYMPTOMS  Usually, there are no symptoms unless the potassium is dangerously high or has risen very quickly. Symptoms may include:  Irregular or very slow heartbeat.   Feeling sick to your stomach (nauseous).   Tiredness (fatigue).   Nerve problems such as tingling of the skin, numbness of the hands or feet, weakness, or paralysis.  DIAGNOSIS  A simple blood test can measure the amount of potassium in your body. An electrocardiogram test of the heart can also help make the diagnosis. The heart may beat dangerously fast or slow down and stop beating with severe hyperkalemia.  TREATMENT  Treatment depends on how bad the condition is and on the underlying cause.  If the hyperkalemia is an emergency (causing heart problems or paralysis), many different medicines can be used alone or together to lower the potassium level briefly. This may include an insulin injection even if you are not diabetic. Emergency dialysis may be needed to remove potassium from the body.   If the hyperkalemia is less severe or dangerous, the underlying cause is treated. This can include taking medicines if needed. Your prescription medicines may be changed. You may also need to take a medicine to help your body get rid of potassium. You may need to eat a diet low in potassium.  HOME CARE  INSTRUCTIONS   Take medicines and supplements as directed by your caregiver.   Do not take any over-the-counter medicines, supplements, natural products, herbs, or vitamins without reviewing them with your caregiver. Certain supplements and natural food products can have high amounts of potassium. Other products (such as ibuprofen) can damage weak kidneys and raise your potassium.   You may be asked to do repeat lab tests. Be sure to follow these directions.   If  you have kidney disease, you may need to follow a low potassium diet.  SEEK MEDICAL CARE IF:   You notice an irregular or very slow heartbeat.   You feel lightheaded.   You develop weakness that is unusual for you.  SEEK IMMEDIATE MEDICAL CARE IF:   You have shortness of breath.   You have chest discomfort.   You pass out (faint).  MAKE SURE YOU:   Understand these instructions.   Will watch your condition.   Will get help right away if you are not doing well or get worse.  Document Released: 05/16/2002 Document Revised: 05/15/2011 Document Reviewed: 10/31/2010 [facility] Patient Information 2012 [place], [place].

## 2011-09-02 ENCOUNTER — Telehealth: Payer: Self-pay | Admitting: Family Medicine

## 2011-09-02 ENCOUNTER — Encounter: Payer: Self-pay | Admitting: Family Medicine

## 2011-09-02 ENCOUNTER — Ambulatory Visit (INDEPENDENT_AMBULATORY_CARE_PROVIDER_SITE_OTHER): Payer: Medicare Other | Admitting: Family Medicine

## 2011-09-02 VITALS — BP 175/81 | HR 105 | Ht 70.0 in | Wt 210.0 lb

## 2011-09-02 DIAGNOSIS — M25562 Pain in left knee: Secondary | ICD-10-CM

## 2011-09-02 DIAGNOSIS — E875 Hyperkalemia: Secondary | ICD-10-CM

## 2011-09-02 DIAGNOSIS — M25569 Pain in unspecified knee: Secondary | ICD-10-CM

## 2011-09-02 NOTE — Telephone Encounter (Signed)
Forwarded to Dr. Walden.Bena Kobel Lynetta  

## 2011-09-02 NOTE — Telephone Encounter (Signed)
I'll be in clinic this afternoon.  I'm not sure what medications were referred to in the other note, so I don't know if he needs to take them "regularly" or not.  Also not clear based on discharge summary why he's falling.  Did he hit his head?  Is he losing consciousness?  Is his leg giving out?  If hit head or trouble with lightheadedness he should be seen today.  If leg giving out, he can wait until tomorrow.

## 2011-09-02 NOTE — Discharge Summary (Signed)
Family Medicine Teaching Service  Discharge Note : Attending [name] MD Pager [phone] Office [phone] I have seen and examined this patient, reviewed their chart and discussed discharge planning wit the resident at the time of discharge. I agree with the discharge plan as above. Dr. Aviva Signs and I gave him a corticosteroid injection into his knee. I have reviewed his x-rays which were nonstaining. I think likely his knee pain and his lack of extension is related to either osteoarthritis and poor to meniscal degeneration. Either way I think a corticosteroid injection is his best option for improvement in function at this time. He will follow the sports medicine clinic for further evaluation.

## 2011-09-02 NOTE — Telephone Encounter (Signed)
Wife is calling to make Dr. Gwendolyn Grant aware that John [name] has fallen again.  He is scheduled to come to see Dr. Elwyn Reach on 3/27 and Dr. Karie Schwalbe on 4/2, but she isn't sure if Dr. Gwendolyn Grant will want him to wait or to be seen sooner.

## 2011-09-02 NOTE — Patient Instructions (Signed)
Put the knee immobilizer on your leg and use this during the day. Keep the walker beside your bed at night.   Keep the Sports Medicine appt.  I'll let you know about the potassium and wound care doctor.

## 2011-09-02 NOTE — Telephone Encounter (Signed)
Spoke with patient . States his legs are giving out. Appointment scheduled for this afternoon with PCP. He has been waiting to hear from [place] before he took any meds today. Advised him to eat and take any meds he usually takes.  States he is feeling well otherwise.

## 2011-09-02 NOTE — Telephone Encounter (Signed)
Patients sister is now calling about the same as below but now he also needs to know if he needs to start taking his medications normally now.

## 2011-09-03 LAB — BASIC METABOLIC PANEL
BUN: 48 mg/dL — ABNORMAL HIGH (ref 6–23)
Creat: 2.68 mg/dL — ABNORMAL HIGH (ref 0.50–1.35)
Potassium: 5.6 mEq/L — ABNORMAL HIGH (ref 3.5–5.3)

## 2011-09-04 ENCOUNTER — Telehealth: Payer: Self-pay | Admitting: Family Medicine

## 2011-09-04 ENCOUNTER — Encounter: Payer: Self-pay | Admitting: Emergency Medicine

## 2011-09-04 ENCOUNTER — Ambulatory Visit (INDEPENDENT_AMBULATORY_CARE_PROVIDER_SITE_OTHER): Payer: Medicare Other | Admitting: Emergency Medicine

## 2011-09-04 VITALS — BP 148/79 | HR 103 | Ht 71.0 in | Wt 210.0 lb

## 2011-09-04 DIAGNOSIS — R7989 Other specified abnormal findings of blood chemistry: Secondary | ICD-10-CM

## 2011-09-04 DIAGNOSIS — M25562 Pain in left knee: Secondary | ICD-10-CM

## 2011-09-04 DIAGNOSIS — E875 Hyperkalemia: Secondary | ICD-10-CM

## 2011-09-04 DIAGNOSIS — M25569 Pain in unspecified knee: Secondary | ICD-10-CM

## 2011-09-04 NOTE — Patient Instructions (Signed)
I'm sorry you're still having trouble with your knee.  Continue to wear the brace - we don't want you falling anymore! Please also elevate your left leg as much as possible and ice the knee twice a day for 10-15 minutes.  Follow up at sport's medicine clinic on April 2nd.

## 2011-09-04 NOTE — Assessment & Plan Note (Signed)
I am concerned he has a meniscal tear. He may also have pulled his hamstring slightly with his fall. He is tender on the medial aspect where hamstring inserts into the knee joint. McMurray's testing is positive for knee pain. We do not have any hinged joint braces here in clinic. He already he has an appointment set up with sports medicine next Monday. And therefore going to recommend him to wear a knee immobilizer until his appointment. He should use a walker without the immobilizer at home to prevent leg weakness and his knee locking on him and becoming stiff. However when he is in public he can use the immobilizer to support his weight and prevent his knee from giving out. I suspect that they will switch him to a hinged knee brace at sports medicine Center. Likely ultrasound and further examination of his knee at sports medicine Center.

## 2011-09-04 NOTE — Telephone Encounter (Signed)
Called and discussed the patient's potassium is 5.6. He states he took his Kayexalate this morning as prescribed. Advised him to have it rechecked Monday or Tuesday next week. I did inform him that I am not here next week week but I will forward this message to Dr. Rivka Safer who is covering the box.  Patient also states that he is wearing his brace on public but not at home. Recommended to not use his immobilizer and the limited output. He has had "give way" once or twice but has not fallen as he is using his walker at home. He does have hoarseness appointment on Tuesday. Recommend keep this appointment.

## 2011-09-04 NOTE — Assessment & Plan Note (Signed)
Continues to have posterior knee pain and sensation of "giving out."  No additional falls since using brace.  Given history and exam, unlikely to be osteoarthritis, so standing films would not be helpful.  Possibly ligamentous or meniscal in origin.  Follow up as scheduled with [facility]; will defer decision about any further imaging to them.  Discussed elevation and ice to help with swelling prior to SM appt.

## 2011-09-04 NOTE — Progress Notes (Signed)
  Subjective:    Patient ID: John [name], male    DOB: 11/06/1955, 56 y.o.   MRN: [id number]  HPI John [name] is here for follow up of left knee pain and hyperkalemia.  1. Left knee pain: located in the posterior knee, worse with rotational movements.  Started abruptly on Saturday with no provocation.  Causes knee to give out, states "it's just not there" when he steps on that leg occasionally.  Was put in a knee immobilizer on Tuesday, continues to feel like knee gives out, but no falls since wearing the brace.  States after the knee gives out it will lock up in the bent position and eventually straightens out.    2. Hyperkalemia: Taking kayexalate every 2-3 days.  Unable to take it daily as it causes too much diarrhea.  Review of Systems See HPI    Objective:   Physical Exam BP 148/79  Pulse 103  Ht 5\' 11"  (1.803 m)  Wt 210 lb (95.255 kg)  BMI 29.29 kg/m2 Gen: Alert, cooperative, NAD, wearing left knee immobilizer Left Knee: no erythema, + soft tissue swelling, no appreciable joint effusion; full range of motion (pain with extreme extension); negative anterior and posterior drawer; no medial or lateral collateral ligament laxity; negative McMurry's     Assessment & Plan:

## 2011-09-04 NOTE — Telephone Encounter (Signed)
Got it.  Thanks,

## 2011-09-04 NOTE — Progress Notes (Signed)
  Subjective:    Patient ID: John [name], male    DOB: 1956/02/15, 56 y.o.   MRN: [id number]  HPI #1. Hospital followup for hyperkalemia: Patient admitted this past weekend for hyperkalemia.  Potassium 6.7 the emergency department and therefore admitted to the hospital for observation. EKG was within normal limits. Hyperkalemia thought likely secondary to medical noncompliance. Patient states that diarrhea limits Kayexalate usage. He states he takes his Lasix and sodium bicarbonate otherwise as prescribed. He has had no further problems since then. No chest pain or shortness of breath.  #2. Left knee pain: Patient has had a limits with his left knee giving way and left knee pain for the past 4 days. This started on Saturday when his left knee gave out and he fell twice that day. When he fell again on Sunday his wife took him to the emergency room. (It was on this visit as the test is done behind). He was discharged on Monday and subsequently suffered another fall after his knee gave way. He is using a walker at home. He did have knee pain after the second fall on Saturday. He received corticoid steroid injection in the hospital. His pain is about the same. However he is very nervous that his knees give way again. He does not have a history of clicking, locking, giving out with me before and the past. However when has fallen his knee has locked currently. No fevers or chills. No injury or inciting event.   Review of Systems See HPI above for review of systems.       Objective:   Physical Exam Gen:  Alert, cooperative patient who appears stated age in no acute distress.  Vital signs reviewed. Cardiac:  Regular rate and rhythm without murmur auscultated.  Good S1/S2. Pulm:  Clear to auscultation bilaterally with good air movement.  No wheezes or rales noted.   Abd:  Soft/nondistended/nontender.  Good bowel sounds throughout all four quadrants.  No masses noted.  Ext:  +1 Edema BL MSK:   Knees:   Right knee WNL.   Left knee: No redness or swelling noted.  Good passive/active range of motion of knee without pain.  Internal and external hip rotation good without pain.  He is tender along hamstring insertion into knee.  No tenderness at medial or lateral joint line.  Unable to appreciate any joint effusion.  Anterior/posterior drawer tests, Lachmann's testing all negative.   McMurray's positive for pain with internal rotation. Patello-femoral testing does reproduce some pain and crepitus.            Assessment & Plan:

## 2011-09-04 NOTE — Assessment & Plan Note (Signed)
Stable based on Bmet on 3/26.  Encouraged use of kayexalate at least every other day.

## 2011-09-04 NOTE — Assessment & Plan Note (Signed)
This is becoming a chronic problem for [name]. We are to recheck his basic metabolic profile today. He states he is taking his Kayexalate as prescribed.

## 2011-09-09 ENCOUNTER — Encounter: Payer: Self-pay | Admitting: *Deleted

## 2011-09-09 ENCOUNTER — Ambulatory Visit (INDEPENDENT_AMBULATORY_CARE_PROVIDER_SITE_OTHER): Payer: Medicare Other | Admitting: Sports Medicine

## 2011-09-09 VITALS — BP 149/87

## 2011-09-09 DIAGNOSIS — M25569 Pain in unspecified knee: Secondary | ICD-10-CM

## 2011-09-09 DIAGNOSIS — M25562 Pain in left knee: Secondary | ICD-10-CM

## 2011-09-09 NOTE — Assessment & Plan Note (Signed)
Suspicious for posteromedial meniscal tear. Failed CSI on 09/01/11 in hospital. Knee sleeve. MRI knee, likely referral for arthroscopy after.

## 2011-09-09 NOTE — Progress Notes (Signed)
Patient ID: John [name], male   DOB: 1955/09/06, 56 y.o.   MRN: [id number]   Berkley Harvey # is [id number] for MRI - given to radiology at [facility].

## 2011-09-09 NOTE — Patient Instructions (Addendum)
MRI left knee. Knee sleeve. Avoid bending >90 degrees. Come back to see me to review results. John [name]. Benjamin Stain, M.D.    You have been scheduled for a MRI of your knee on 09/11/11 at 9am, please arrive at the radiology department at [facility] at 8:45am.

## 2011-09-09 NOTE — Progress Notes (Signed)
  Subjective:    Patient ID: John [name], male    DOB: 21-Feb-1956, 56 y.o.   MRN: [id number]  HPI John [name] comes to see [place] as a referral from [facility] practice for left knee pain.here episode where he had a sudden onset of pain at the posterior medial joint line. Afterwards he began to have mechanical symptoms that consisted locking, as well as buckling. He does get occasional swelling as well. He was seen in the hospital for this, and had a corticosteroid injection into his knee, which did not help. Since then he's been in a knee immobilizer, and has been given Percocet for his pain. He has not yet had an MRI, however he did have x-rays that showed no degenerative changes.  Past medical history, surgical history, family history, social history, allergies, and medications reviewed from the medical record and no changes needed. Review of Systems    No fevers, chills, night sweats, weight loss, chest pain, or shortness of breath.  Social History: Non-smoker. Objective:   Physical Exam General:  Well developed, well nourished, and in no acute distress. Neuro:  Alert and oriented x3, extra-ocular muscles intact. Skin: Warm and dry, no rashes noted. Respiratory:  Not using accessory muscles, speaking in full sentences. Musculoskeletal: Left Knee: Normal to inspection with no erythema or effusion or obvious bony abnormalities. Posterior medial joint line pain. Reproduction of pain with extreme flexion, difficulty getting the knee completely extended. Ligaments with solid consistent endpoints including ACL, PCL, LCL, MCL. Negative Mcmurray's, Apley's, and Thessalonian tests. Non painful patellar compression. Patellar glide without crepitus. Patellar and quadriceps tendons unremarkable. Hamstring and quadriceps strength is normal.        Assessment & Plan:

## 2011-09-11 ENCOUNTER — Ambulatory Visit (HOSPITAL_COMMUNITY)
Admission: RE | Admit: 2011-09-11 | Discharge: 2011-09-11 | Disposition: A | Discharge status: Home / Self Care | Payer: Medicare Other | Source: Ambulatory Visit | Attending: Sports Medicine | Admitting: Sports Medicine

## 2011-09-11 DIAGNOSIS — X58XXXA Exposure to other specified factors, initial encounter: Secondary | ICD-10-CM | POA: Insufficient documentation

## 2011-09-11 DIAGNOSIS — S82109A Unspecified fracture of upper end of unspecified tibia, initial encounter for closed fracture: Secondary | ICD-10-CM | POA: Insufficient documentation

## 2011-09-11 DIAGNOSIS — M25562 Pain in left knee: Secondary | ICD-10-CM

## 2011-09-16 ENCOUNTER — Ambulatory Visit (INDEPENDENT_AMBULATORY_CARE_PROVIDER_SITE_OTHER): Payer: Medicare Other | Admitting: Sports Medicine

## 2011-09-16 DIAGNOSIS — M25562 Pain in left knee: Secondary | ICD-10-CM

## 2011-09-16 DIAGNOSIS — M25569 Pain in unspecified knee: Secondary | ICD-10-CM

## 2011-09-16 MED ORDER — CALCIUM-MAGNESIUM-VITAMIN D ER 600-40-500 MG-MG-UNIT PO TB24: 1.0000 | ORAL_TABLET | Freq: Two times a day (BID) | ORAL | Status: DC

## 2011-09-16 NOTE — Assessment & Plan Note (Signed)
MRI results as above. Cont WBAT. Cont knee sleeve. No NSAIDS. Avoid all impact activities. RTC 4 weeks.

## 2011-09-16 NOTE — Progress Notes (Signed)
  Subjective:    Patient ID: John [name], male    DOB: 06/20/1955, 56 y.o.   MRN: [id number]  HPI Amil comes back for followup of his left knee pain. He remembers a fall, but no subsequent injuries. He did have one injection in the hospital which did not help. I saw him recently, MRI of his knee. The MRI showed a subchondral insufficiency fracture along the medial posterior tibial plateau. Overall he walks with a cane, does not do any high weightbearing activities, and is wearing a knee sleeve now. He feels the knee sleeve helped significantly.   Review of Systems    No fevers, chills, night sweats, weight loss, chest pain, or shortness of breath.  Social History: Non-smoker. Objective:   Physical Exam General:  Well developed, well nourished, and in no acute distress. Neuro:  Alert and oriented x3, extra-ocular muscles intact. Skin: Warm and dry, no rashes noted. Respiratory:  Not using accessory muscles, speaking in full sentences. Musculoskeletal: Left Knee: Normal to inspection with no erythema or effusion or obvious bony abnormalities. Posterior medial joint line pain. Reproduction of pain with extreme flexion, difficulty getting the knee completely extended. Ligaments with solid consistent endpoints including ACL, PCL, LCL, MCL. Negative Mcmurray's, Apley's, and Thessalonian tests. Non painful patellar compression. Patellar glide without crepitus. Patellar and quadriceps tendons unremarkable. Hamstring and quadriceps strength is normal.    MRI left knee: Subchondral insufficiency fracture along the posterior medial tibial plateau. No meniscal or ligamentous injuries. Mild degenerative joint disease.    Assessment & Plan:

## 2011-09-25 ENCOUNTER — Other Ambulatory Visit: Payer: Self-pay | Admitting: Family Medicine

## 2011-10-06 ENCOUNTER — Ambulatory Visit (INDEPENDENT_AMBULATORY_CARE_PROVIDER_SITE_OTHER): Payer: Medicare Other | Admitting: Family Medicine

## 2011-10-06 ENCOUNTER — Encounter: Payer: Self-pay | Admitting: Family Medicine

## 2011-10-06 VITALS — BP 145/70 | HR 99 | Temp 98.5°F | Ht 71.0 in | Wt 203.0 lb

## 2011-10-06 DIAGNOSIS — E1165 Type 2 diabetes mellitus with hyperglycemia: Secondary | ICD-10-CM

## 2011-10-06 DIAGNOSIS — M549 Dorsalgia, unspecified: Secondary | ICD-10-CM

## 2011-10-06 DIAGNOSIS — E118 Type 2 diabetes mellitus with unspecified complications: Secondary | ICD-10-CM

## 2011-10-06 DIAGNOSIS — E875 Hyperkalemia: Secondary | ICD-10-CM

## 2011-10-06 DIAGNOSIS — IMO0002 Reserved for concepts with insufficient information to code with codable children: Secondary | ICD-10-CM | POA: Insufficient documentation

## 2011-10-06 LAB — BASIC METABOLIC PANEL
BUN: 52 mg/dL — ABNORMAL HIGH (ref 6–23)
CO2: 23 mEq/L (ref 19–32)
Calcium: 8.8 mg/dL (ref 8.4–10.5)
Creat: 2.71 mg/dL — ABNORMAL HIGH (ref 0.50–1.35)
Glucose, Bld: 200 mg/dL — ABNORMAL HIGH (ref 70–99)

## 2011-10-06 MED ORDER — OXYCODONE-ACETAMINOPHEN 7.5-325 MG PO TABS: 1.0000 | ORAL_TABLET | Freq: Four times a day (QID) | ORAL | Status: DC | PRN

## 2011-10-06 MED ORDER — DOXYCYCLINE HYCLATE 100 MG PO TABS: 100.0000 mg | ORAL_TABLET | Freq: Two times a day (BID) | ORAL | Status: AC

## 2011-10-06 NOTE — Assessment & Plan Note (Signed)
Refilled Percocet #180 for one month per PCP's usual protocol.

## 2011-10-06 NOTE — Progress Notes (Signed)
  Subjective:    Patient ID: John [name], male    DOB: [birth year], 56 y.o.   MRN: [id number]  HPIHere for routine follow-up for PCP who is out of the office  DIABETES  Taking and tolerating: yes- lantus 30 units qam, skips sometimes, 5 units at night. Fasting blood sugars:80-160's Hypoglycemic symptoms: no, reports numbers as low int he 80's and on those days skips lantus Visual problems: no Monitoring feet: yes Numbness/Tingling: yes, chronic Last eye exam:last year per patient- Diabetic Labs:  Lab Results  Component Value Date   HGBA1C 10.9* 08/31/2011   HGBA1C 10.6 07/15/2011   HGBA1C 8.9 03/31/2011   Lab Results  Component Value Date   LDLCALC 76 07/15/2011   CREATININE 2.68* 09/02/2011   Last microalbumin: No results found for this basename: MICROALBUR, [id number]   HYPERTENSION  BP Readings from Last 3 Encounters:  10/06/11 145/70  09/09/11 149/87  09/04/11 148/79    Hypertension ROS: taking medications as instructed, no medication side effects noted, no chest pain on exertion, no dyspnea on exertion and no swelling of ankles.   Hyperkalemia:  Every other day  Chronic pain:  History of low back pain- chronic percocet dose.  Chronic Pain Follow-Up Assessment Chronic Pain Diagnosis: back low pain  Pain scale rating at its BEST in the past 24 hours (1 to 10):5 Pain scale rating at its WORST in the past 24 hours (1 to 10): 11 Adverse Effects:  On med for constipation Treatment of Adverse Effects: no constipation. Since the last clinic visit, how much relief have pain treatment and medication provided? Please circle   Right foot- two days ago noticed swelling and soreness on medial edge of great toe- has been cleaning with iodine, pus drainage.    I have reviewed patient's  PMH, FH, and Social history and Medications as related to this visit. Peripheral neuroapthy   Review of Systemssee HPI     Objective:   Physical Exam  GEN: Alert & Oriented, No acute  distress CV:  Regular Rate & Rhythm, no murmur Respiratory:  Normal work of breathing, CTAB Ext: no pre-tibial edema  Right great toe:  fluctiant pocket on medial and distal edge of toe- easily expressed bloody purulent material.  Mildly tender- (baseline of peripheral neuroapthy)  Decreases pulses.      Assessment & Plan:

## 2011-10-06 NOTE — Assessment & Plan Note (Signed)
A1c 10.9%, poorly controlled.  Not consistently taking lantus regulalry, only taking humalog at dinner.  Advised to maintain current lantus dose (no sig hypoglycemia) will encourage to take mealtime coverage with each meal.  I gave him a paper log to keep and he will bring back for further titration.

## 2011-10-06 NOTE — Assessment & Plan Note (Signed)
His history is present for 2 days, does not appear acutely inflamed but with purulent drainage.  Will continue soaks, cleaning and rx doxycycline for MRSA coverage.  Will return in 2-3 days for recheck, low threshold for broadening coverage in DM.

## 2011-10-06 NOTE — Patient Instructions (Signed)
I would like for you to take 5 units of humalog with breakfast, lunch and dinner  Keep a log of your sugars and bring  Soak your toe in warm water and use antibiotic- doxycycline  Return in 2-3 days

## 2011-10-06 NOTE — Assessment & Plan Note (Signed)
States he is taking kayexelate every other day.  Will check today.

## 2011-10-07 ENCOUNTER — Telehealth: Payer: Self-pay | Admitting: Family Medicine

## 2011-10-07 DIAGNOSIS — E875 Hyperkalemia: Secondary | ICD-10-CM

## 2011-10-07 NOTE — Telephone Encounter (Signed)
Discussed with patient hyperkalemia.  He reports taking it every other day. Asymptomatic.  Advised taking it twice today, then coming in tomorrow for blood draw.  Will see him in 2 days for follow-up.

## 2011-10-07 NOTE — Assessment & Plan Note (Signed)
Will icnreas kayexelate and follow-up for blood draw tomorrow

## 2011-10-08 ENCOUNTER — Other Ambulatory Visit: Payer: Medicare Other

## 2011-10-08 LAB — BASIC METABOLIC PANEL
Calcium: 8 mg/dL — ABNORMAL LOW (ref 8.4–10.5)
Creat: 2.76 mg/dL — ABNORMAL HIGH (ref 0.50–1.35)

## 2011-10-08 NOTE — Progress Notes (Signed)
Bmp done today Kalieb Freeland 

## 2011-10-09 ENCOUNTER — Encounter: Payer: Self-pay | Admitting: Family Medicine

## 2011-10-09 ENCOUNTER — Ambulatory Visit (INDEPENDENT_AMBULATORY_CARE_PROVIDER_SITE_OTHER): Payer: Medicare Other | Admitting: Family Medicine

## 2011-10-09 VITALS — BP 138/76 | HR 98 | Temp 98.7°F | Ht 71.0 in | Wt 204.9 lb

## 2011-10-09 DIAGNOSIS — E118 Type 2 diabetes mellitus with unspecified complications: Secondary | ICD-10-CM

## 2011-10-09 DIAGNOSIS — E875 Hyperkalemia: Secondary | ICD-10-CM

## 2011-10-09 DIAGNOSIS — IMO0002 Reserved for concepts with insufficient information to code with codable children: Secondary | ICD-10-CM

## 2011-10-09 DIAGNOSIS — E1165 Type 2 diabetes mellitus with hyperglycemia: Secondary | ICD-10-CM

## 2011-10-09 NOTE — Assessment & Plan Note (Signed)
Improved with more regular use of Kayexalate.  Will cont sodium bicarb, increase Kayexalate from every other day to daily (erregular use was the issue) and recheck in 5 days.  Encouraged him to follow-up with his nephrologist

## 2011-10-09 NOTE — Progress Notes (Signed)
  Subjective:    Patient ID: John [name], male    DOB: 1955/06/25, 56 y.o.   MRN: [id number]  HPI Here for follow-up  Great toe cellulitis:  Cellulitis resoled, no pain (has peripheral neuropathy) No fever.  Some drainage.  Using doxycycline and topical mupirocin.  DM:  Brings back log for past 3 days.  Fasting 297, 280, 100, 72.  Before lunch 191, 170, 120, 80. Today   Hypoerkalemia:  Has been taking kayexelate BID for the past 3 days.  Came back for potassium recheck yesterday, improved from 6.3 to 5.4.   Review of Systemssee HPI     Objective:   Physical Exam GEN: NAD Right great toe:  Improved cellulitis.  Excess skin from previous blister trapping pus/sebacueous material.  Thickened friable, long great toenail.  Procedure:  Incision of skin overlying bullae on medial surface using #15 blade and Gebauers spray.  Also clipped great toenail.  Small amount of medial ingrown.  Patient tolerated procedure well- no pain- has peripheral neuropathy. NO bleeding.   Skin under bullae examined: granulation tissue with an area unstageable due to eschar.       Assessment & Plan:

## 2011-10-09 NOTE — Assessment & Plan Note (Signed)
Improving, I & D to help prevent fluid and material from being trapped under excess skin from bullae.  Also clipped toenail.  Advised continuing Doxycycline and mupirocin.  Will follow-up in 5 days to reassess.  No concerned for deeper infection/oseto at this time.

## 2011-10-09 NOTE — Assessment & Plan Note (Signed)
Based on his limited glucose log brought today- it seems like compliance rather than dosing may be the cause of A1c > 10%.  He is doing some experimentation with his indslin doses, encouraged him to keep the lantus the same every day and we will work on [employer] his novolog.  Will keep log and bring back in 5-7 days

## 2011-10-09 NOTE — Patient Instructions (Signed)
Keep writing down sugars and how much insulin you gave  Follow-up in 5-7 days for your toe and your diabetes  Take Kayexalate every day

## 2011-10-15 ENCOUNTER — Ambulatory Visit (INDEPENDENT_AMBULATORY_CARE_PROVIDER_SITE_OTHER): Payer: Medicare Other | Admitting: Family Medicine

## 2011-10-15 VITALS — BP 134/74 | HR 102 | Temp 98.4°F | Ht 71.0 in | Wt 205.5 lb

## 2011-10-15 DIAGNOSIS — IMO0002 Reserved for concepts with insufficient information to code with codable children: Secondary | ICD-10-CM

## 2011-10-15 DIAGNOSIS — E1165 Type 2 diabetes mellitus with hyperglycemia: Secondary | ICD-10-CM

## 2011-10-15 DIAGNOSIS — E118 Type 2 diabetes mellitus with unspecified complications: Secondary | ICD-10-CM

## 2011-10-15 NOTE — Assessment & Plan Note (Signed)
Did not bring log today.  Opted not to change lantus due to previous hypoglycemia although could probably use increased basal insulin.  At this time will focus on becoming more compliance with getting  5 units of humalog with his lunch and dinner which he is agreeable with.  Will follow-up with PCP in 3-4 weeks for further titration.

## 2011-10-15 NOTE — Progress Notes (Signed)
  Subjective:    Patient ID: John [name], male    DOB: Feb 05, 1956, 56 y.o.   MRN: [id number]  HPI Here for follow-up of paronychia and diabetes  Diabetes: Forgot to bring his log, going by memory 160-200's fasting- takes lantus 30 units QHS Before lunch 180-190: 4-5 units Before dinner not checked 4-5 units  Not taking Humalog regulalry.  Has had hypoglycemia in the past but not in the past week  Paronychia: feels it is improving. No wrosenign redness, drainage.   Review of Systems See HPI    Objective:   Physical Exam  GEN: Alert & Oriented, No acute distress Right great toe- continued improvement, some drainage, no erythema.  Cap refill < 2 sec       Assessment & Plan:

## 2011-10-15 NOTE — Assessment & Plan Note (Addendum)
Continued improvement, although slow.  Advised him to finish course of doxycyline, continue mupirocin topically.  We discussed he is high risk of toe amputation for chronic nonhealing wound given his diabetes, severe neuropathy.  He will continue to watch toe closely and will return sooner if not continuing to heal, or any sign of worsening.   Otherwise will follow-up with PCP in 3-4 weeks

## 2011-10-15 NOTE — Patient Instructions (Signed)
Keep taking Lantus 30 units  Make sure you you get 5 units of novolog with lunch and 5 units with dinner  Make appointment 1st week of June to follow-up diabetes with Dr. Gwendolyn Grant- bring back sugar log  If you notice skin on your tow is not healing, getting red, draining pus, or gets black, then come back sooner

## 2011-10-21 ENCOUNTER — Ambulatory Visit (INDEPENDENT_AMBULATORY_CARE_PROVIDER_SITE_OTHER): Payer: Medicare Other | Admitting: Sports Medicine

## 2011-10-21 ENCOUNTER — Encounter: Payer: Self-pay | Admitting: Sports Medicine

## 2011-10-21 VITALS — BP 151/77 | HR 90

## 2011-10-21 DIAGNOSIS — M25562 Pain in left knee: Secondary | ICD-10-CM

## 2011-10-21 DIAGNOSIS — M25569 Pain in unspecified knee: Secondary | ICD-10-CM

## 2011-10-21 NOTE — Progress Notes (Signed)
  Subjective:    Patient ID: John [name], male    DOB: 21-Apr-1956, 56 y.o.   MRN: [id number]  HPI John [name] comes back to see [place] one month after initiating a weight bearing but no impact plus compression treatment for a subchondral insufficiency fracture in his left knee. Overall he is pain free at this point.   Review of Systems    No fevers, chills, night sweats, weight loss, chest pain, or shortness of breath.  Social History: Non-smoker. Objective:   Physical Exam General:  Well developed, well nourished, and in no acute distress. Neuro:  Alert and oriented x3, extra-ocular muscles intact. Skin: Warm and dry, no rashes noted. Respiratory:  Not using accessory muscles, speaking in full sentences. Musculoskeletal: Left Knee: Normal to inspection with no erythema or effusion or obvious bony abnormalities. Palpation normal with no warmth, joint line tenderness, patellar tenderness, or condyle tenderness. ROM full in flexion and extension and lower leg rotation. Ligaments with solid consistent endpoints including ACL, PCL, LCL, MCL. Negative Mcmurray's, Apley's, and Thessalonian tests. Non painful patellar compression. Patellar glide without crepitus. Patellar and quadriceps tendons unremarkable. Hamstring and quadriceps strength is normal.   2+ pitting edema to the left lower extremity     Assessment & Plan:

## 2011-10-21 NOTE — Assessment & Plan Note (Signed)
Cont WBAT.  Cont knee sleeve.  No NSAIDS.  Avoid all impact activities.  RTC 4 weeks, if continues to be pain free I will release him.  Would also recommend him talk to his primary care physician's about compression hose for the swelling in his left leg.

## 2011-10-29 ENCOUNTER — Encounter: Payer: Self-pay | Admitting: Family Medicine

## 2011-10-29 ENCOUNTER — Ambulatory Visit (INDEPENDENT_AMBULATORY_CARE_PROVIDER_SITE_OTHER): Payer: Medicare Other | Admitting: Family Medicine

## 2011-10-29 VITALS — BP 160/74 | HR 97 | Temp 98.5°F | Ht 71.0 in | Wt 203.7 lb

## 2011-10-29 DIAGNOSIS — M79671 Pain in right foot: Secondary | ICD-10-CM | POA: Insufficient documentation

## 2011-10-29 DIAGNOSIS — E1142 Type 2 diabetes mellitus with diabetic polyneuropathy: Secondary | ICD-10-CM

## 2011-10-29 DIAGNOSIS — E1149 Type 2 diabetes mellitus with other diabetic neurological complication: Secondary | ICD-10-CM

## 2011-10-29 DIAGNOSIS — M79672 Pain in left foot: Secondary | ICD-10-CM

## 2011-10-29 DIAGNOSIS — H6120 Impacted cerumen, unspecified ear: Secondary | ICD-10-CM

## 2011-10-29 DIAGNOSIS — M79609 Pain in unspecified limb: Secondary | ICD-10-CM

## 2011-10-29 MED ORDER — NORTRIPTYLINE HCL 10 MG PO CAPS: 10.0000 mg | ORAL_CAPSULE | Freq: Every day | ORAL | Status: DC

## 2011-10-29 NOTE — Assessment & Plan Note (Signed)
Patient's right foot pain and recent history of infection of the toe. Patient also been diabetic would be very concerned for potential osteomyelitis. Patient though has not had any systemic findings such as fevers chills nausea or vomiting. Would like to stop though with an MRI. Also in the differential a potential sesamoid bone fracture. Patient also might have bone bruising secondary to him having a right ankle sprain previously and potentially compensating and putting more force on the first toe. Also with diabetes potential vascular complications can arise and we will get a ABI done to make sure blood flow is good. Patient was put in a postop boot today. MRI pending rule out osteomyelitis or fracture. Patient to return to see primary care provider in 2 weeks. Patient has pain medications already.

## 2011-10-29 NOTE — Patient Instructions (Signed)
Very nice to meet you. I'm giving you a boot to wear to see if this would help with the pain in your foot. I'm also going to get an MRI of your foot to rule out any type of infection or fracture. I am also going to get a test to check the blood supply in your foot. I'm also going to give you a medicine that he take at night to see if it will help with the neuropathy in your foot. Please come back at followup appointment with your primary care provider.

## 2011-10-29 NOTE — Progress Notes (Signed)
  Subjective:    Patient ID: John [name], male    DOB: Oct 18, 1955, 56 y.o.   MRN: [id number]  HPI  Here for follow-up of paronychia and worsening foot pain  Patient states that he has finished the doxycycline that he was given but he is continued the topical antibiotics. Patient denies any more discharge from the toe and seemed to be healing fairly well. Patient though states that he is having significant pain more on the plantar aspect of his foot on the right foot. Patient denies any fevers or chills any weight loss. Patient states that he has had discoloration of the toe and it seems to be normal. Patient denies that the foot is warm. Patient also denies any type of swelling   Review of Systems  See HPI    Objective:   Physical Exam   GEN: Alert & Oriented, No acute distress Right great toe- patient does not have any erythema of the toe or the area where it was healing. The wound seems to be healed at this time. Patient though does have a lot of postinflammatory hyperpigmented changes of the foot. Patient is also  significantly tender to palpation over the plantar aspect at the first metatarsal. Patient has some mild skin breakdown but no dermatitis. No signs of infection no open wounds. Appears to have some bruising at that point as well. Patient does have 2+ pulses palpated of the dorsalis pedis on the right foot.      Assessment & Plan:

## 2011-10-31 ENCOUNTER — Ambulatory Visit (HOSPITAL_COMMUNITY)
Admission: RE | Admit: 2011-10-31 | Discharge: 2011-10-31 | Disposition: A | Discharge status: Home / Self Care | Payer: Medicare Other | Source: Ambulatory Visit | Attending: Family Medicine | Admitting: Family Medicine

## 2011-10-31 DIAGNOSIS — M79609 Pain in unspecified limb: Secondary | ICD-10-CM | POA: Insufficient documentation

## 2011-10-31 DIAGNOSIS — M25579 Pain in unspecified ankle and joints of unspecified foot: Secondary | ICD-10-CM | POA: Insufficient documentation

## 2011-10-31 DIAGNOSIS — R937 Abnormal findings on diagnostic imaging of other parts of musculoskeletal system: Secondary | ICD-10-CM | POA: Insufficient documentation

## 2011-10-31 DIAGNOSIS — M79672 Pain in left foot: Secondary | ICD-10-CM

## 2011-10-31 DIAGNOSIS — S8990XA Unspecified injury of unspecified lower leg, initial encounter: Secondary | ICD-10-CM | POA: Insufficient documentation

## 2011-10-31 DIAGNOSIS — X58XXXA Exposure to other specified factors, initial encounter: Secondary | ICD-10-CM | POA: Insufficient documentation

## 2011-11-01 DIAGNOSIS — L97509 Non-pressure chronic ulcer of other part of unspecified foot with unspecified severity: Secondary | ICD-10-CM

## 2011-11-02 ENCOUNTER — Telehealth: Payer: Self-pay | Admitting: Family Medicine

## 2011-11-02 DIAGNOSIS — I998 Other disorder of circulatory system: Secondary | ICD-10-CM

## 2011-11-02 NOTE — Telephone Encounter (Signed)
Called patient told him that he does have a blood flow problem and we will be getting him to see a vascular surgeon for evaluation and management.  Will have wonderful staff call on Tuesday and set up appointment, this would be considered urgent.  Pt states the toe is not worse and seems to be improving very slowly, gave him red flags and when to seek medical attention.  Will forward to PCP as well so he is aware.  Order is in for referral.

## 2011-11-04 NOTE — Telephone Encounter (Signed)
Terese Door is working on.  Will forward to her. Toan Mort, Maryjo Rochester

## 2011-11-07 ENCOUNTER — Encounter: Payer: Self-pay | Admitting: Vascular Surgery

## 2011-11-10 ENCOUNTER — Ambulatory Visit (INDEPENDENT_AMBULATORY_CARE_PROVIDER_SITE_OTHER): Payer: Medicare Other | Admitting: Family Medicine

## 2011-11-10 DIAGNOSIS — IMO0002 Reserved for concepts with insufficient information to code with codable children: Secondary | ICD-10-CM

## 2011-11-10 DIAGNOSIS — E1165 Type 2 diabetes mellitus with hyperglycemia: Secondary | ICD-10-CM

## 2011-11-10 DIAGNOSIS — M79671 Pain in right foot: Secondary | ICD-10-CM

## 2011-11-10 DIAGNOSIS — E118 Type 2 diabetes mellitus with unspecified complications: Secondary | ICD-10-CM

## 2011-11-10 DIAGNOSIS — M79609 Pain in unspecified limb: Secondary | ICD-10-CM

## 2011-11-10 MED ORDER — OXYCODONE-ACETAMINOPHEN 7.5-325 MG PO TABS: 1.0000 | ORAL_TABLET | Freq: Four times a day (QID) | ORAL | Status: DC | PRN

## 2011-11-10 MED ORDER — DOXYCYCLINE HYCLATE 100 MG PO TABS: 100.0000 mg | ORAL_TABLET | Freq: Two times a day (BID) | ORAL | Status: DC

## 2011-11-10 MED ORDER — CIPROFLOXACIN HCL 500 MG PO TABS: 500.0000 mg | ORAL_TABLET | Freq: Two times a day (BID) | ORAL | Status: DC

## 2011-11-10 MED ORDER — ALBUTEROL SULFATE HFA 108 (90 BASE) MCG/ACT IN AERS: 2.0000 | INHALATION_SPRAY | Freq: Four times a day (QID) | RESPIRATORY_TRACT | Status: DC | PRN

## 2011-11-10 NOTE — Patient Instructions (Addendum)
Start taking the Doxycycline and the Ciprofloxacin -- these are antibiotics for your foot. Take these for the next 2 weeks until all the pills are gone.  Pain med refills today.  Keep your appt with the Vascular surgeon on Wednesday. Come back and see me next week so we can see what the surgeon said and see how you're foot is doing. I will also send in an inhaler for your cough -- let me know how that's doing.    If you don't see any changes with Nortriptyline for next month, let me know and we can stop that.

## 2011-11-10 NOTE — Assessment & Plan Note (Signed)
Concern is now for possible osteomyelitis in patient with marrow edema on MRI.   No systemic symptoms, no indication for hospitalization. Treat on outpatient basis with Cipro/Doxy.   Has appt with Vascular surgeon on Wednesday, to keep that appt.

## 2011-11-10 NOTE — Progress Notes (Signed)
  Subjective:    Patient ID: John [name], male    DOB: 07-Oct-1955, 56 y.o.   MRN: [id number]  HPI 1.  Right toe infection:  Diagnosed with paronychia in past month of Right great toe.  Nail trimmed by another physician, also placed on Doxycycline with some relief of purulent drainage; however worsened since stopping antibiotic.  Insensate to toe.  No fevers, chills, nausea, or vomiting.    2.  Decreased sensation Right foot:  Persists.  ABI indicated decreased perfusion to Right foot, and in particular Right great toe when compared with Left.  Has appt with Vascular surgery in 2 days.  No falls or pain in Right foot.   2.  Chronic pain:  Back pain persists.  Taking Oxycodone every 6 hours, with relief of pain.  Denies any side effects.  Able to ambulate and participate in life events/ADLs when using Oxycodone.  Unable to to participate without medication.  Pain on daily basis, 4/10 with medication, 8-9/10 without meds.    The following portions of the patient's history were reviewed and updated as appropriate: allergies, current medications, past medical history, family and social history, and problem list.  Patient is a current everyday smoker.   Review of Systems See HPI above for review of systems.       Objective:   Physical Exam  Gen:  Alert, cooperative patient who appears stated age in no acute distress.  Vital signs reviewed. Head:  [place]/AT Mouth:  MMM Cardiac:  Regular rate and rhythm without murmur auscultated.  Good S1/S2. Pulm:  Clear to auscultation bilaterally with good air movement.  No wheezes or rales noted.   Abd:  Soft/nondistended/nontender.  Good bowel sounds throughout all four quadrants.  No masses noted.  Ext:  No edema noted BL.   Right great toe with purulent drainage and open ulcer noted lateral aspect of Right great toe.  No tenderness to palpation.  Decreased hair and shininess to skin noted Right > Left leg.      Assessment & Plan:

## 2011-11-10 NOTE — Assessment & Plan Note (Signed)
Not controlled.   Question compliance.  FU in 2 weeks to readdress toe and medication regimen.

## 2011-11-10 NOTE — Assessment & Plan Note (Signed)
ABI showed decreased flow. Already with appt set up with vascular surgeon in 2 days. To FU with me in 1 week to discuss findings and plan and readdress toe.

## 2011-11-11 ENCOUNTER — Encounter: Payer: Self-pay | Admitting: Vascular Surgery

## 2011-11-12 ENCOUNTER — Encounter: Payer: Self-pay | Admitting: Vascular Surgery

## 2011-11-12 ENCOUNTER — Ambulatory Visit (INDEPENDENT_AMBULATORY_CARE_PROVIDER_SITE_OTHER): Payer: Medicare Other | Admitting: Vascular Surgery

## 2011-11-12 VITALS — BP 149/72 | HR 112 | Temp 98.7°F | Ht 69.0 in | Wt 201.0 lb

## 2011-11-12 DIAGNOSIS — R0989 Other specified symptoms and signs involving the circulatory and respiratory systems: Secondary | ICD-10-CM

## 2011-11-12 DIAGNOSIS — F17200 Nicotine dependence, unspecified, uncomplicated: Secondary | ICD-10-CM

## 2011-11-12 DIAGNOSIS — I70219 Atherosclerosis of native arteries of extremities with intermittent claudication, unspecified extremity: Secondary | ICD-10-CM

## 2011-11-12 NOTE — Assessment & Plan Note (Signed)
We have discussed the importance of tobacco cessation and I have mentioned calling the [facility] and the importance of this. He understands it continued to smoke will certainly increase his risk of limb loss and progressive atherosclerotic disease.  We'll also significantly interfere with his chance of healing the wounds on the right foot.

## 2011-11-12 NOTE — Progress Notes (Signed)
[facility]  Patient name: John [name] MRN: [id number] DOB: 1955/09/14 Sex: male  REASON FOR VISIT: right great toe wound  HPI: John [name] is a 56 y.o. male who was being followed by Dr. Darrick Penna back in 2010 with claudication. Dr. Darrick Penna a discussed with him the option of proceeding with arteriography however the patient did not wish to proceed and was then lost to follow up. He states he's had claudication in both lower extremities for several years and that these symptoms have gradually progressed somewhat. His symptoms are more significant on the right side. He experiences pain in the calves associated with ambulation and relieved with rest. He's had no significant thigh or hip claudication. He denies rest pain. He does experience pain in his right heel at night however. He apparently has had some drainage from the right great toe and there was some concern that he may have osteomyelitis. However x-rays, including MRI, have not been conclusive. He states that he's had no further drainage from the right great toe wound since he has been on antibiotics. He feels that the toe has improved significantly.  He was sent for vascular consultation. Of note he does have a history of renal insufficiency but is not on dialysis.  Past Medical History  Diagnosis Date  . COPD (chronic obstructive pulmonary disease)   . Chronic back pain     Chronic narcotics for this for many years  . Diabetes mellitus   . Chronic kidney disease     CKD stage III  . Hyperlipidemia   . Hypertension   . Foot pain   . Stroke     Family History  Problem Relation Age of Onset  . Diabetes Mother   . Heart disease Mother   . Hypertension Mother   . Heart attack Mother   . Diabetes Father   . Heart disease Father   . Hypertension Father   . Heart attack Father   . Other Father     bleeding problems  . Diabetes Brother   . Heart disease Brother     SOCIAL HISTORY: History    Substance Use Topics  . Smoking status: Current Everyday Smoker -- 0.5 packs/day for 40 years    Types: Cigarettes  . Smokeless tobacco: Never Used   Comment: Previous 2 ppd smoker Durwin Nora)  . Alcohol Use: No     Previous drinker - However, quit in 2006    Allergies  Allergen Reactions  . Vicodin (Hydrocodone-Acetaminophen)     itching    Current Outpatient Prescriptions  Medication Sig Dispense Refill  . albuterol (PROVENTIL HFA;VENTOLIN HFA) 108 (90 BASE) MCG/ACT inhaler Inhale 2 puffs into the lungs every 6 (six) hours as needed for wheezing.  1 Inhaler  0  . amLODipine (NORVASC) 10 MG tablet Take 10 mg by mouth daily.      Marland Kitchen aspirin 81 MG chewable tablet Chew 81 mg by mouth daily.      . baclofen (LIORESAL) 10 MG tablet Take 10 mg by mouth 2 (two) times daily.       . baclofen (LIORESAL) 10 MG tablet TAKE 1 TABLET BY MOUTH TWICE DAILY  60 tablet  3  . Calcium-Magnesium-Vitamin D (CITRACAL CALCIUM+D) 600-40-500 MG-MG-UNIT TB24 Take 1 tablet by mouth 2 (two) times daily.  60 tablet  3  . ciprofloxacin (CIPRO) 500 MG tablet Take 1 tablet (500 mg total) by mouth 2 (two) times daily.  28 tablet  0  .  doxycycline (VIBRA-TABS) 100 MG tablet Take 1 tablet (100 mg total) by mouth 2 (two) times daily.  28 tablet  0  . furosemide (LASIX) 40 MG tablet Take 40 mg by mouth daily.      . insulin glargine (LANTUS) 100 UNIT/ML injection Inject 30 Units into the skin daily as needed. Per sliding scale.      . insulin lispro (HUMALOG) 100 UNIT/ML injection Inject 5-8 Units into the skin daily as needed. Per sliding scale.      . nortriptyline (PAMELOR) 10 MG capsule Take 1 capsule (10 mg total) by mouth at bedtime.  30 capsule  3  . oxyCODONE-acetaminophen (PERCOCET) 7.5-325 MG per tablet Take 1 tablet by mouth every 6 (six) hours as needed for pain. Do not fill until 30 days from this date  120 tablet  0  . oxyCODONE-acetaminophen (PERCOCET) 7.5-325 MG per tablet Take 1 tablet by mouth every 6  (six) hours as needed for pain. Do not fill until 60 days from this date  120 tablet  0  . simvastatin (ZOCOR) 80 MG tablet Take 1 tablet (80 mg total) by mouth at bedtime.  90 tablet  3  . sodium bicarbonate 650 MG tablet Take 2 tablets (1,300 mg total) by mouth 2 (two) times daily.  120 tablet  6  . sodium polystyrene (KAYEXALATE) 15 GM/60ML suspension Take 15 g by mouth daily.       Marland Kitchen oxyCODONE-acetaminophen (PERCOCET) 7.5-325 MG per tablet Take 1 tablet by mouth every 6 (six) hours as needed. For pain.  120 tablet  0  . DISCONTD: tiotropium (SPIRIVA HANDIHALER) 18 MCG inhalation capsule Place 1 capsule (18 mcg total) into inhaler and inhale daily.  30 capsule  2    REVIEW OF SYSTEMS: Arly.Keller ] denotes positive finding; [  ] denotes negative finding  CARDIOVASCULAR:  [ ]  chest pain   [ ]  chest pressure   [ ]  palpitations   [ ]  orthopnea   [ ]  dyspnea on exertion   Arly.Keller ] claudication   [ ]  rest pain   [ ]  DVT   [ ]  phlebitis PULMONARY:   [ ]  productive cough   Arly.Keller ] asthma   [ ]  wheezing NEUROLOGIC:   [ ]  weakness  [ ]  paresthesias  [ ]  aphasia  [ ]  amaurosis  [ ]  dizziness HEMATOLOGIC:   [ ]  bleeding problems   [ ]  clotting disorders MUSCULOSKELETAL:  [ ]  joint pain   [ ]  joint swelling [ ]  leg swelling GASTROINTESTINAL: [ ]   blood in stool  [ ]   hematemesis GENITOURINARY:  [ ]   dysuria  [ ]   hematuria PSYCHIATRIC:  [ ]  history of major depression INTEGUMENTARY:  [ ]  rashes  [ ]  ulcers CONSTITUTIONAL:  [ ]  fever   [ ]  chills  PHYSICAL EXAM: Filed Vitals:   11/12/11 0932  BP: 149/72  Pulse: 112  Temp: 98.7 F (37.1 C)  TempSrc: Oral  Height: 5\' 9"  (1.753 m)  Weight: 201 lb (91.173 kg)  SpO2: 97%   Body mass index is 29.68 kg/(m^2). GENERAL: The patient is a well-nourished male, in no acute distress. The vital signs are documented above. CARDIOVASCULAR: There is a regular rate and rhythm without significant murmur appreciated. He has a right carotid bruit. He has palpable but slightly  diminished femoral pulses bilaterally. I cannot palpate popliteal or pedal pulses. I did get a fairly reasonable right posterior tibial signal with the Doppler. However I was unable to  complete my exam as the Doppler failed and we had no other functioning Dopplers. PULMONARY: There is good air exchange bilaterally without wheezing or rales. ABDOMEN: Soft and non-tender with normal pitched bowel sounds. I cannot palpate an abdominal aortic aneurysm although his abdomen is somewhat large and difficult to assess. MUSCULOSKELETAL: There are no major deformities or cyanosis. NEUROLOGIC: No focal weakness or paresthesias are detected. SKIN: he has some discoloration of the right great toe but currently there are no open wounds and no drainage or erythema that I can see. PSYCHIATRIC: The patient has a normal affect.  DATA:  I did review his MRI of the right forefoot. There was no definitive evidence of osteomyelitis. I also reviewed an arterial Doppler study that was done at [place] on 10/31/2011. This showed a left great toe pressure of 100 mmHg and a right great toe pressure of 36 mm of mercury. He had a monophasic anterior tibial posterior tibial and peroneal signals bilaterally. ABI on the right was 72% and on the left 69%. I suspect that these are falsely elevated because of his calcific disease. I have also reviewed his records from Dr. Claiborne Rigg office. He has been following him with foot pain. He had been following him with a paronychia was a resolving. The patient has been on doxycycline and Cipro.  MEDICAL ISSUES:  TOBACCO DEPENDENCE We have discussed the importance of tobacco cessation and I have mentioned calling the [facility] cessation program and the importance of this. He understands it continued to smoke will certainly increase his risk of limb loss and progressive atherosclerotic disease.  We'll also significantly interfere with his chance of healing the wounds on the right  foot.  Atherosclerosis of native arteries of the extremities with intermittent claudication This patient has stable claudication of both lower extremities. However based on a toe pressure of 36 mm of mercury on the right and a recent issues with his right great toe I have recommended that we proceed with arteriography to further evaluate his infrainguinal arterial occlusive disease. Certainly if he has any progression of the wounds on the right foot he would require revascularization. Currently feels that the wound has improved significantly, however, I think it is important to have this information so that it is any significant change we know what options he has for revascularization. His arteriogram will have to be done with CO2 because of his renal insufficiency. We've also discussed the potential complications of bleeding arterial injury associated with arteriography. 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 He had previously been followed by Dr. Darrick Penna in so we'll arrange for him to have an arteriogram with Dr. Darrick Penna in the near future.  Right carotid bruit This patient has a right carotid bruit and I have ordered a carotid duplex scan when the patient comes in for their follow up visit after his arteriogram. He has a remote history of stroke is had no recent neurologic symptoms.    Raciel Caffrey S [facility] Beeper: [phone]

## 2011-11-12 NOTE — Assessment & Plan Note (Signed)
This patient has a right carotid bruit and I have ordered a carotid duplex scan when the patient comes in for their follow up visit after his arteriogram. He has a remote history of stroke is had no recent neurologic symptoms.

## 2011-11-12 NOTE — Assessment & Plan Note (Signed)
This patient has stable claudication of both lower extremities. However based on a toe pressure of 36 mm of mercury on the right and a recent issues with his right great toe I have recommended that we proceed with arteriography to further evaluate his infrainguinal arterial occlusive disease. Certainly if he has any progression of the wounds on the right foot he would require revascularization. Currently feels that the wound has improved significantly, however, I think it is important to have this information so that it is any significant change we know what options he has for revascularization. His arteriogram will have to be done with CO2 because of his renal insufficiency. We've also discussed the potential complications of bleeding arterial injury associated with arteriography. 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 He had previously been followed by Dr. Darrick Penna in so we'll arrange for him to have an arteriogram with Dr. Darrick Penna in the near future.

## 2011-11-13 ENCOUNTER — Other Ambulatory Visit: Payer: Self-pay

## 2011-11-13 ENCOUNTER — Encounter (HOSPITAL_COMMUNITY): Payer: Self-pay | Admitting: Pharmacy Technician

## 2011-11-17 ENCOUNTER — Ambulatory Visit: Payer: Medicare Other | Admitting: Family Medicine

## 2011-11-18 ENCOUNTER — Ambulatory Visit (INDEPENDENT_AMBULATORY_CARE_PROVIDER_SITE_OTHER): Payer: Medicare Other | Admitting: Sports Medicine

## 2011-11-18 VITALS — BP 120/70

## 2011-11-18 DIAGNOSIS — M25569 Pain in unspecified knee: Secondary | ICD-10-CM

## 2011-11-18 DIAGNOSIS — M25562 Pain in left knee: Secondary | ICD-10-CM

## 2011-11-18 NOTE — Progress Notes (Signed)
Patient ID: John [name], male   DOB: Dec 21, 1955, 56 y.o.   MRN: [id number]  Subjective:   WU:[id number] of left knee subchondral insufficiency fracture  WGN:FAOZH comes back, and he has now been approximately 2 months with weightbearing as tolerated, no impact. He's been wearing his knee sleeve. He was pain-free at the last visit, and continues to be pain free today. There's no swelling, and no mechanical symptoms.  Of note he is currently undergoing a workup for vascular insufficiency.  Past medical history, surgical history, family history, social history, allergies, and medications reviewed from the medical record and no changes needed.  Review of Systems: No fevers, chills, night sweats, weight loss, chest pain, or shortness of breath.    Objective:  General:  Well Developed, well nourished, and in no acute distress. Neuro:  Alert and oriented x3, extra-ocular muscles intact. Skin: Warm and dry, no rashes noted. Respiratory:  Not using accessory muscles, speaking in full sentences. Musculoskeletal:Knee: Normal to inspection with no erythema or effusion or obvious bony abnormalities. Palpation normal with no warmth, joint line tenderness, patellar tenderness, or condyle tenderness. ROM full in flexion and extension and lower leg rotation. Ligaments with solid consistent endpoints including ACL, PCL, LCL, MCL. Negative Mcmurray's, Apley's, and Thessalonian tests. Non painful patellar compression. Patellar glide without crepitus. Patellar and quadriceps tendons unremarkable. Hamstring and quadriceps strength is normal.    Assessment & Plan:

## 2011-11-18 NOTE — Assessment & Plan Note (Signed)
Resolved. He may D/C the knee sleeve. I would like to see him back on an as-needed basis.

## 2011-11-20 MED ORDER — SODIUM CHLORIDE 0.9 % IV SOLN
INTRAVENOUS | Status: DC
  Administered 2011-11-21: 08:00:00 via INTRAVENOUS

## 2011-11-21 ENCOUNTER — Encounter (HOSPITAL_COMMUNITY)
Admission: RE | Disposition: A | Discharge status: Home / Self Care | Payer: Self-pay | Source: Ambulatory Visit | Attending: Vascular Surgery

## 2011-11-21 ENCOUNTER — Ambulatory Visit (HOSPITAL_COMMUNITY)
Admission: RE | Admit: 2011-11-21 | Discharge: 2011-11-21 | Disposition: A | Discharge status: Home / Self Care | Payer: Medicare Other | Source: Ambulatory Visit | Attending: Vascular Surgery | Admitting: Vascular Surgery

## 2011-11-21 DIAGNOSIS — I129 Hypertensive chronic kidney disease with stage 1 through stage 4 chronic kidney disease, or unspecified chronic kidney disease: Secondary | ICD-10-CM | POA: Insufficient documentation

## 2011-11-21 DIAGNOSIS — M549 Dorsalgia, unspecified: Secondary | ICD-10-CM | POA: Insufficient documentation

## 2011-11-21 DIAGNOSIS — L98499 Non-pressure chronic ulcer of skin of other sites with unspecified severity: Secondary | ICD-10-CM

## 2011-11-21 DIAGNOSIS — Z79899 Other long term (current) drug therapy: Secondary | ICD-10-CM | POA: Insufficient documentation

## 2011-11-21 DIAGNOSIS — Z8673 Personal history of transient ischemic attack (TIA), and cerebral infarction without residual deficits: Secondary | ICD-10-CM | POA: Insufficient documentation

## 2011-11-21 DIAGNOSIS — E119 Type 2 diabetes mellitus without complications: Secondary | ICD-10-CM | POA: Insufficient documentation

## 2011-11-21 DIAGNOSIS — E785 Hyperlipidemia, unspecified: Secondary | ICD-10-CM | POA: Insufficient documentation

## 2011-11-21 DIAGNOSIS — G8929 Other chronic pain: Secondary | ICD-10-CM | POA: Insufficient documentation

## 2011-11-21 DIAGNOSIS — I739 Peripheral vascular disease, unspecified: Secondary | ICD-10-CM

## 2011-11-21 DIAGNOSIS — N183 Chronic kidney disease, stage 3 unspecified: Secondary | ICD-10-CM | POA: Insufficient documentation

## 2011-11-21 DIAGNOSIS — F17200 Nicotine dependence, unspecified, uncomplicated: Secondary | ICD-10-CM | POA: Insufficient documentation

## 2011-11-21 DIAGNOSIS — I70299 Other atherosclerosis of native arteries of extremities, unspecified extremity: Secondary | ICD-10-CM | POA: Insufficient documentation

## 2011-11-21 HISTORY — PX: LOWER EXTREMITY ANGIOGRAM: SHX5508

## 2011-11-21 LAB — POCT I-STAT, CHEM 8
BUN: 52 mg/dL — ABNORMAL HIGH (ref 6–23)
Calcium, Ion: 0.95 mmol/L — ABNORMAL LOW (ref 1.12–1.32)
Creatinine, Ser: 3 mg/dL — ABNORMAL HIGH (ref 0.50–1.35)
Glucose, Bld: 193 mg/dL — ABNORMAL HIGH (ref 70–99)
TCO2: 18 mmol/L (ref 0–100)

## 2011-11-21 LAB — GLUCOSE, CAPILLARY: Glucose-Capillary: 177 mg/dL — ABNORMAL HIGH (ref 70–99)

## 2011-11-21 SURGERY — ANGIOGRAM, LOWER EXTREMITY
Anesthesia: LOCAL | Laterality: Right

## 2011-11-21 MED ORDER — FENTANYL CITRATE 0.05 MG/ML IJ SOLN
INTRAMUSCULAR | Status: AC
Start: 2011-11-21 — End: 2011-11-21
  Filled 2011-11-21: qty 2

## 2011-11-21 MED ORDER — LABETALOL HCL 5 MG/ML IV SOLN: 10.0000 mg | INTRAVENOUS | Status: DC | PRN

## 2011-11-21 MED ORDER — SODIUM CHLORIDE 0.9 % IV SOLN: INTRAVENOUS | Status: DC

## 2011-11-21 MED ORDER — METOPROLOL TARTRATE 1 MG/ML IV SOLN: 2.0000 mg | INTRAVENOUS | Status: DC | PRN

## 2011-11-21 MED ORDER — ONDANSETRON HCL 4 MG/2ML IJ SOLN: 4.0000 mg | Freq: Four times a day (QID) | INTRAMUSCULAR | Status: DC | PRN

## 2011-11-21 MED ORDER — MIDAZOLAM HCL 2 MG/2ML IJ SOLN
INTRAMUSCULAR | Status: AC
  Filled 2011-11-21: qty 2

## 2011-11-21 MED ORDER — ACETAMINOPHEN 325 MG PO TABS: 325.0000 mg | ORAL_TABLET | ORAL | Status: DC | PRN

## 2011-11-21 MED ORDER — MORPHINE SULFATE 10 MG/ML IJ SOLN: 2.0000 mg | INTRAMUSCULAR | Status: DC | PRN

## 2011-11-21 MED ORDER — HYDRALAZINE HCL 20 MG/ML IJ SOLN: 10.0000 mg | INTRAMUSCULAR | Status: DC | PRN

## 2011-11-21 MED ORDER — ACETAMINOPHEN 325 MG RE SUPP: 325.0000 mg | RECTAL | Status: DC | PRN

## 2011-11-21 MED ORDER — PHENOL 1.4 % MT LIQD: 1.0000 | OROMUCOSAL | Status: DC | PRN

## 2011-11-21 MED ORDER — GUAIFENESIN-DM 100-10 MG/5ML PO SYRP: 15.0000 mL | ORAL_SOLUTION | ORAL | Status: DC | PRN

## 2011-11-21 MED ORDER — HEPARIN (PORCINE) IN NACL 2-0.9 UNIT/ML-% IJ SOLN
INTRAMUSCULAR | Status: AC
  Filled 2011-11-21: qty 1000

## 2011-11-21 MED ORDER — LIDOCAINE HCL (PF) 1 % IJ SOLN
INTRAMUSCULAR | Status: AC
  Filled 2011-11-21: qty 30

## 2011-11-21 NOTE — Op Note (Signed)
Procedure: Abdominal aortogram with right lower extremity runoff  Preoperative diagnosis: Nonhealing wound right foot  Postoperative diagnosis: Same  Anesthesia: Local with IV sedation  Findings: CO2 contrast with 10 cc of iodinated contrast, right superficial femoral artery occlusion reconstitution of the above-knee and below-knee popliteal artery with peroneal and posterior tibial artery runoff to the right foot  Operative details: After obtaining informed consent, the patient was brought to the PV lab. The patient was placed in supine position the Angio table. Both groins were prepped and draped in usual sterile fashion. Local anesthesia was infiltrated over the left common femoral artery. An introducer needle was used to cannulate the left common femoral artery and an 035 first core wire threaded in the abdominal aorta under 5 French sheath is a blister over the guidewire and the left common femoral artery. This was thoroughly flushed with heparinized saline. A 5 French pigtail catheter was then placed over the guidewire and the abdominal aorta. Abdominal aortogram was obtained in AP and oblique projections using CO2 contrast. This shows a patent infrarenal abdominal aorta and patent left and right common external and internal iliac arteries. The internal iliac arteries are severely diseased bilaterally. Next, the 5 French pigtail catheter was pulled down just above the aortic bifurcation and pelvic angiogram obtained showing similar findings.  I then removed the pigtail catheter over the guidewire. This was then exchanged for a 5 [place] crossover catheter. This was used to selectively catheterize the right common iliac artery. The guidewire was then advanced into the distal right external iliac artery. Right lower extremity runoff views were then obtained. Shows the right common femoral artery is patent. The profunda femoris artery is patent. The right superficial femoral artery is occluded  approximately 3 cm after it's takeoff. The popliteal artery is reconstituted via profunda collaterals. The below-knee popliteal artery this appears to be a healthier vessel although he images are somewhat limited by CO2 opacification. The anterior tibial artery is occluded in the mid leg. The peroneal and posterior tibial arteries are patent with two-vessel runoff to the right foot. One injection with 10 cc of contrast was performed try to further define this. However the images were of poor quality and no further injections with iodinated contrast were performed due to the patient's underlying renal dysfunction.  Next the catheter was pulled back a right guidewire. The 5 French sheath was left in place the left common femoral artery pulled in the holding area. The patient tolerated procedure well there were complications. Patient was taken to the holding area in stable condition. He images will be discussed with Dr. Cari Caraway who will determine further evaluation of the patient.  Fabienne Bruns, MD [facility] Office: [phone] Pager: [phone]

## 2011-11-21 NOTE — Interval H&P Note (Signed)
History and Physical Interval Note:  11/21/2011 9:03 AM  John [name]  has presented today for surgery, with the diagnosis of PVD  The various methods of treatment have been discussed with the patient and family. After consideration of risks, benefits and other options for treatment, the patient has consented to  Procedure(s) (LRB): LOWER EXTREMITY ANGIOGRAM (Right) as a surgical intervention .  The patients' history has been reviewed, patient examined, no change in status, stable for surgery.  I have reviewed the patients' chart and labs.  Questions were answered to the patient's satisfaction.     John [name] E

## 2011-11-21 NOTE — Discharge Instructions (Signed)

## 2011-11-21 NOTE — H&P (View-Only) (Signed)
[facility] [place]  Patient name: Kamryn R Ferner MRN: [id number] DOB: 10/31/1955 Sex: male  REASON FOR VISIT: right great toe wound  HPI: Blane R Kalan is a 56 y.o. male who was being followed by Dr. Fields back in 2010 with claudication. Dr. Fields a discussed with him the option of proceeding with arteriography however the patient did not wish to proceed and was then lost to follow up. He states he's had claudication in both lower extremities for several years and that these symptoms have gradually progressed somewhat. His symptoms are more significant on the right side. He experiences pain in the calves associated with ambulation and relieved with rest. He's had no significant thigh or hip claudication. He denies rest pain. He does experience pain in his right heel at night however. He apparently has had some drainage from the right great toe and there was some concern that he may have osteomyelitis. However x-rays, including MRI, have not been conclusive. He states that he's had no further drainage from the right great toe wound since he has been on antibiotics. He feels that the toe has improved significantly.  He was sent for vascular consultation. Of note he does have a history of renal insufficiency but is not on dialysis.  Past Medical History  Diagnosis Date  . COPD (chronic obstructive pulmonary disease)   . Chronic back pain     Chronic narcotics for this for many years  . Diabetes mellitus   . Chronic kidney disease     CKD stage III  . Hyperlipidemia   . Hypertension   . Foot pain   . Stroke     Family History  Problem Relation Age of Onset  . Diabetes Mother   . Heart disease Mother   . Hypertension Mother   . Heart attack Mother   . Diabetes Father   . Heart disease Father   . Hypertension Father   . Heart attack Father   . Other Father     bleeding problems  . Diabetes Brother   . Heart disease Brother     SOCIAL HISTORY: History    Substance Use Topics  . Smoking status: Current Everyday Smoker -- 0.5 packs/day for 40 years    Types: Cigarettes  . Smokeless tobacco: Never Used   Comment: Previous 2 ppd smoker ([place])  . Alcohol Use: No     Previous drinker - However, quit in 2006    Allergies  Allergen Reactions  . Vicodin (Hydrocodone-Acetaminophen)     itching    Current Outpatient Prescriptions  Medication Sig Dispense Refill  . albuterol (PROVENTIL HFA;VENTOLIN HFA) 108 (90 BASE) MCG/ACT inhaler Inhale 2 puffs into the lungs every 6 (six) hours as needed for wheezing.  1 Inhaler  0  . amLODipine (NORVASC) 10 MG tablet Take 10 mg by mouth daily.      . aspirin 81 MG chewable tablet Chew 81 mg by mouth daily.      . baclofen (LIORESAL) 10 MG tablet Take 10 mg by mouth 2 (two) times daily.       . baclofen (LIORESAL) 10 MG tablet TAKE 1 TABLET BY MOUTH TWICE DAILY  60 tablet  3  . Calcium-Magnesium-Vitamin D (CITRACAL CALCIUM+D) 600-40-500 MG-MG-UNIT TB24 Take 1 tablet by mouth 2 (two) times daily.  60 tablet  3  . ciprofloxacin (CIPRO) 500 MG tablet Take 1 tablet (500 mg total) by mouth 2 (two) times daily.  28 tablet  0  .   doxycycline (VIBRA-TABS) 100 MG tablet Take 1 tablet (100 mg total) by mouth 2 (two) times daily.  28 tablet  0  . furosemide (LASIX) 40 MG tablet Take 40 mg by mouth daily.      . insulin glargine (LANTUS) 100 UNIT/ML injection Inject 30 Units into the skin daily as needed. Per sliding scale.      . insulin lispro (HUMALOG) 100 UNIT/ML injection Inject 5-8 Units into the skin daily as needed. Per sliding scale.      . nortriptyline (PAMELOR) 10 MG capsule Take 1 capsule (10 mg total) by mouth at bedtime.  30 capsule  3  . oxyCODONE-acetaminophen (PERCOCET) 7.5-325 MG per tablet Take 1 tablet by mouth every 6 (six) hours as needed for pain. Do not fill until 30 days from this date  120 tablet  0  . oxyCODONE-acetaminophen (PERCOCET) 7.5-325 MG per tablet Take 1 tablet by mouth every 6  (six) hours as needed for pain. Do not fill until 60 days from this date  120 tablet  0  . simvastatin (ZOCOR) 80 MG tablet Take 1 tablet (80 mg total) by mouth at bedtime.  90 tablet  3  . sodium bicarbonate 650 MG tablet Take 2 tablets (1,300 mg total) by mouth 2 (two) times daily.  120 tablet  6  . sodium polystyrene (KAYEXALATE) 15 GM/60ML suspension Take 15 g by mouth daily.       . oxyCODONE-acetaminophen (PERCOCET) 7.5-325 MG per tablet Take 1 tablet by mouth every 6 (six) hours as needed. For pain.  120 tablet  0  . DISCONTD: tiotropium (SPIRIVA HANDIHALER) 18 MCG inhalation capsule Place 1 capsule (18 mcg total) into inhaler and inhale daily.  30 capsule  2    REVIEW OF SYSTEMS: [X ] denotes positive finding; [  ] denotes negative finding  CARDIOVASCULAR:  [ ] chest pain   [ ] chest pressure   [ ] palpitations   [ ] orthopnea   [ ] dyspnea on exertion   [X ] claudication   [ ] rest pain   [ ] DVT   [ ] phlebitis PULMONARY:   [ ] productive cough   [X ] asthma   [ ] wheezing NEUROLOGIC:   [ ] weakness  [ ] paresthesias  [ ] aphasia  [ ] amaurosis  [ ] dizziness HEMATOLOGIC:   [ ] bleeding problems   [ ] clotting disorders MUSCULOSKELETAL:  [ ] joint pain   [ ] joint swelling [ ] leg swelling GASTROINTESTINAL: [ ]  blood in stool  [ ]  hematemesis GENITOURINARY:  [ ]  dysuria  [ ]  hematuria PSYCHIATRIC:  [ ] history of major depression INTEGUMENTARY:  [ ] rashes  [ ] ulcers CONSTITUTIONAL:  [ ] fever   [ ] chills  PHYSICAL EXAM: Filed Vitals:   11/12/11 0932  BP: 149/72  Pulse: 112  Temp: 98.7 F (37.1 C)  TempSrc: Oral  Height: 5' 9" (1.753 m)  Weight: 201 lb (91.173 kg)  SpO2: 97%   Body mass index is 29.68 kg/(m^2). GENERAL: The patient is a well-nourished male, in no acute distress. The vital signs are documented above. CARDIOVASCULAR: There is a regular rate and rhythm without significant murmur appreciated. He has a right carotid bruit. He has palpable but slightly  diminished femoral pulses bilaterally. I cannot palpate popliteal or pedal pulses. I did get a fairly reasonable right posterior tibial signal with the Doppler. However I was unable to   complete my exam as the Doppler failed and we had no other functioning Dopplers. PULMONARY: There is good air exchange bilaterally without wheezing or rales. ABDOMEN: Soft and non-tender with normal pitched bowel sounds. I cannot palpate an abdominal aortic aneurysm although his abdomen is somewhat large and difficult to assess. MUSCULOSKELETAL: There are no major deformities or cyanosis. NEUROLOGIC: No focal weakness or paresthesias are detected. SKIN: he has some discoloration of the right great toe but currently there are no open wounds and no drainage or erythema that I can see. PSYCHIATRIC: The patient has a normal affect.  DATA:  I did review his MRI of the right forefoot. There was no definitive evidence of osteomyelitis. I also reviewed an arterial Doppler study that was done at [facility] on 10/31/2011. This showed a left great toe pressure of 100 mmHg and a right great toe pressure of 36 mm of mercury. He had a monophasic anterior tibial posterior tibial and peroneal signals bilaterally. ABI on the right was 72% and on the left 69%. I suspect that these are falsely elevated because of his calcific disease. I have also reviewed his records from Dr. Zachary Basil's office. He has been following him with foot pain. He had been following him with a paronychia was a resolving. The patient has been on doxycycline and Cipro.  MEDICAL ISSUES:  TOBACCO DEPENDENCE We have discussed the importance of tobacco cessation and I have mentioned calling the [facility] tobacco cessation program and the importance of this. He understands it continued to smoke will certainly increase his risk of limb loss and progressive atherosclerotic disease.  We'll also significantly interfere with his chance of healing the wounds on the right  foot.  Atherosclerosis of native arteries of the extremities with intermittent claudication This patient has stable claudication of both lower extremities. However based on a toe pressure of 36 mm of mercury on the right and a recent issues with his right great toe I have recommended that we proceed with arteriography to further evaluate his infrainguinal arterial occlusive disease. Certainly if he has any progression of the wounds on the right foot he would require revascularization. Currently feels that the wound has improved significantly, however, I think it is important to have this information so that it is any significant change we know what options he has for revascularization. His arteriogram will have to be done with CO2 because of his renal insufficiency. We've also discussed the potential complications of bleeding arterial injury associated with arteriography. 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 He had previously been followed by Dr. Fields in so we'll arrange for him to have an arteriogram with Dr. Fields in the near future.  Right carotid bruit This patient has a right carotid bruit and I have ordered a carotid duplex scan when the patient comes in for their follow up visit after his arteriogram. He has a remote history of stroke is had no recent neurologic symptoms.    Erasmo Vertz S [facility] [place] Beeper: [phone]    

## 2011-11-24 ENCOUNTER — Telehealth: Payer: Self-pay | Admitting: Vascular Surgery

## 2011-11-24 ENCOUNTER — Other Ambulatory Visit: Payer: Self-pay | Admitting: *Deleted

## 2011-11-24 DIAGNOSIS — I739 Peripheral vascular disease, unspecified: Secondary | ICD-10-CM

## 2011-11-24 DIAGNOSIS — Z01810 Encounter for preprocedural cardiovascular examination: Secondary | ICD-10-CM

## 2011-11-24 NOTE — Telephone Encounter (Signed)
Scheduled, spoke with patient to notify. I also gave him a heads up that he will have to wait in between his lab and seeing CSD. He was ok with this. Dpm

## 2011-11-24 NOTE — Telephone Encounter (Signed)
Message copied by Fredrich Birks on Mon Nov 24, 2011  9:39 AM ------      Message from: Melene Plan      Created: Fri Nov 21, 2011  4:50 PM      Regarding: FW: follow up                   ----- Message -----         From: Chuck Hint, MD         Sent: 11/21/2011   1:38 PM           To: Reuel Derby, Melene Plan, RN      Subject: follow up                                                This patient will need a follow up visit in 2-3 weeks to follow his right great toe wound and consider him for a femoropopliteal bypass grafting. He comes in he needs a vein map of his right greater saphenous vein also. Thank you. CSD

## 2011-11-25 ENCOUNTER — Ambulatory Visit (INDEPENDENT_AMBULATORY_CARE_PROVIDER_SITE_OTHER): Payer: Medicare Other | Admitting: Family Medicine

## 2011-11-25 VITALS — BP 124/74 | HR 118 | Temp 98.1°F | Ht 69.0 in | Wt 199.0 lb

## 2011-11-25 DIAGNOSIS — M79609 Pain in unspecified limb: Secondary | ICD-10-CM

## 2011-11-25 DIAGNOSIS — M79671 Pain in right foot: Secondary | ICD-10-CM

## 2011-11-25 DIAGNOSIS — E875 Hyperkalemia: Secondary | ICD-10-CM

## 2011-11-25 DIAGNOSIS — I70219 Atherosclerosis of native arteries of extremities with intermittent claudication, unspecified extremity: Secondary | ICD-10-CM

## 2011-11-25 LAB — BASIC METABOLIC PANEL
CO2: 20 mEq/L (ref 19–32)
Calcium: 8.6 mg/dL (ref 8.4–10.5)
Creat: 2.75 mg/dL — ABNORMAL HIGH (ref 0.50–1.35)
Glucose, Bld: 204 mg/dL — ABNORMAL HIGH (ref 70–99)
Sodium: 142 mEq/L (ref 135–145)

## 2011-11-26 ENCOUNTER — Encounter: Payer: Self-pay | Admitting: Family Medicine

## 2011-11-26 NOTE — Assessment & Plan Note (Signed)
Persists, but improved. To continue with post-op shoe when ambulating.   Likely combination of claudication with possible fracture base of 4th metatarsal.

## 2011-11-26 NOTE — Assessment & Plan Note (Signed)
Acute worsening due to dye load. Expect this to improve over time. Checking creatinine again today.

## 2011-11-26 NOTE — Progress Notes (Signed)
  Subjective:    Patient ID: John [name], male    DOB: 1955/07/29, 56 y.o.   MRN: [id number]  HPI  1.  Follow-up from arteriogram and foot/leg:  Patient with chronic foot and LE pain, seen here in clinic in May and had MRI which revealed bone edema base of 4th metatarsal as well as edema of great phalanx tip.  Had infection evident at that time but this has since resolved.  Still with some mild pain across dorsal aspect of foot.  Ambulating with post-op shoe.  Had arteriogram several days ago which revealed right superficial femoral artery occlusion reconstitution of the above-knee and below-knee popliteal artery with peroneal and posterior tibial artery runoff to the right foot.  Has FU with [facility] where fem-pop bypass will evidently be recommended based on notes.   Since being seen, patient has noted some mild discoloration of skin at head of 1st metarsal and 2nd metatarsal.  No increase or change in pain.  No pain with elevation at foot or rest pain.  Limited by claudication after about 100 yards, which has been unchanged for past several months.   Review of Systems See HPI above for review of systems.       Objective:   Physical Exam Gen:  Alert, cooperative patient who appears stated age in no acute distress.  Vital signs reviewed. Cardiac:  Regular rate and rhythm without murmur auscultated.  Good S1/S2. Pulm:  Clear to auscultation bilaterally with good air movement.  No wheezes or rales noted.   Ext:  Pulses not palpable BL LE's.  Skin healing at distal tip of Right great phalanx.  No purulence or erythema noted.  Skin:  Hyperpigmentation noted across head of 1st and 2nd metatarsal.          Assessment & Plan:

## 2011-11-26 NOTE — Assessment & Plan Note (Signed)
Has appt with [facility] next Wednesday. No emergent or urgent issues that would warrant moving that appt up. Wound of Right foot is improving based on my exam today. Based on notes from [facility], likely recommendation of revascularization via fem-pop bypass.

## 2011-12-02 ENCOUNTER — Encounter: Payer: Self-pay | Admitting: Vascular Surgery

## 2011-12-03 ENCOUNTER — Ambulatory Visit (INDEPENDENT_AMBULATORY_CARE_PROVIDER_SITE_OTHER): Payer: Medicare Other | Admitting: Vascular Surgery

## 2011-12-03 ENCOUNTER — Encounter: Payer: Self-pay | Admitting: *Deleted

## 2011-12-03 ENCOUNTER — Encounter: Payer: Self-pay | Admitting: Vascular Surgery

## 2011-12-03 VITALS — BP 134/79 | HR 98 | Temp 98.4°F | Resp 12 | Ht 69.0 in | Wt 200.0 lb

## 2011-12-03 DIAGNOSIS — I739 Peripheral vascular disease, unspecified: Secondary | ICD-10-CM

## 2011-12-03 DIAGNOSIS — I6529 Occlusion and stenosis of unspecified carotid artery: Secondary | ICD-10-CM

## 2011-12-03 DIAGNOSIS — I70219 Atherosclerosis of native arteries of extremities with intermittent claudication, unspecified extremity: Secondary | ICD-10-CM

## 2011-12-03 DIAGNOSIS — R0989 Other specified symptoms and signs involving the circulatory and respiratory systems: Secondary | ICD-10-CM

## 2011-12-03 DIAGNOSIS — Z01810 Encounter for preprocedural cardiovascular examination: Secondary | ICD-10-CM

## 2011-12-03 NOTE — Assessment & Plan Note (Signed)
The patient's right great toe wound has continued to improve. His claudication symptoms in the right are stable. For this reason at this point I would not recommend revascularization unless the toe wound returned. He appears to be a candidate for a fem to above-knee popliteal artery bypass graft on the right if he develops progressive symptoms. Of note vein map of the right shows that the vein is somewhat marginal in size. We'll continue to follow his peripheral vascular disease and I'll plan on seeing him back for this in 2 weeks after his carotid endarterectomy in in 6 months after that. We have again discussed the importance of tobacco cessation.

## 2011-12-03 NOTE — Assessment & Plan Note (Signed)
Carotid duplex scan today shows a greater than 80% left carotid stenosis. He is asymptomatic. However, given the severity of stenosis I have recommended left carotid endarterectomy in order to lower his risk of future stroke. I have reviewed the indications for carotid endarterectomy, that is to lower the risk of future stroke. I have also reviewed the potential complications of surgery, including but not limited to: bleeding, stroke (perioperative risk 1-2%), MI, nerve injury of other unpredictable medical problems. All of the patients questions were answered and they are agreeable to proceed with surgery.  The surgery is scheduled for July 11. He knows to continue his aspirin right up through surgery.

## 2011-12-03 NOTE — Progress Notes (Signed)
RLE vein  mapping duplex performed @ [facility] 12/03/2011

## 2011-12-03 NOTE — Progress Notes (Signed)
[facility] [place]  Patient name: John [name] MRN: [id number] DOB: 11/23/1955 Sex: male  CC: asymptomatic greater than 80% left carotid stenosis.  HPI: John [name] is a 55 y.o. male who I seen on 11/12/2011 the right great toe wound which had been improving. He underwent a CO2 arteriogram to determine if he was a candidate for revascularization of the wound progresses. In addition I had detected a carotid bruit so he also had a carotid duplex scan today on his follow up visit. He is right-handed. He denies any history of stroke, TIAs, expressive or receptive aphasia, or amaurosis fugax.  With respect to his right great toe he says that this wound has essentially healed at this point and he has stable claudication of the right lower extremity. He has had no fever or chills. He has almost complete is his Cipro which she was on for his toe.  Past Medical History  Diagnosis Date  . COPD (chronic obstructive pulmonary disease)   . Chronic back pain     Chronic narcotics for this for many years  . Diabetes mellitus   . Chronic kidney disease     CKD stage III  . Hyperlipidemia   . Hypertension   . Foot pain   . Stroke     Family History  Problem Relation Age of Onset  . Diabetes Mother   . Heart disease Mother   . Hypertension Mother   . Heart attack Mother   . Diabetes Father   . Heart disease Father   . Hypertension Father   . Heart attack Father   . Other Father     bleeding problems  . Diabetes Brother   . Heart disease Brother     SOCIAL HISTORY: History  Substance Use Topics  . Smoking status: Current Everyday Smoker -- 0.5 packs/day for 40 years    Types: Cigarettes  . Smokeless tobacco: Never Used   Comment: smoking cessation info given and reviewed  . Alcohol Use: No     Previous drinker - However, quit in 2006    Allergies  Allergen Reactions  . Vicodin (Hydrocodone-Acetaminophen)     itching    Current Outpatient Prescriptions    Medication Sig Dispense Refill  . albuterol (PROVENTIL HFA;VENTOLIN HFA) 108 (90 BASE) MCG/ACT inhaler Inhale 2 puffs into the lungs every 6 (six) hours as needed. For shortness of breath.      . amLODipine (NORVASC) 10 MG tablet Take 10 mg by mouth daily.      . aspirin 81 MG chewable tablet Chew 81 mg by mouth daily.      . baclofen (LIORESAL) 10 MG tablet Take 10 mg by mouth 2 (two) times daily.       . Calcium-Magnesium-Vitamin D (CITRACAL CALCIUM+D) 600-40-500 MG-MG-UNIT TB24 Take 1 tablet by mouth 2 (two) times daily.  60 tablet  3  . ciprofloxacin (CIPRO) 500 MG tablet Take 500 mg by mouth 2 (two) times daily. Take for 10 days.  First dose 11/12/2011.      . doxycycline (VIBRA-TABS) 100 MG tablet Take 100 mg by mouth 2 (two) times daily. Take for 10 days.  First dose 11/12/2011.      . furosemide (LASIX) 40 MG tablet Take 40 mg by mouth daily.      . insulin glargine (LANTUS) 100 UNIT/ML injection Inject 30 Units into the skin every morning.      . insulin lispro (HUMALOG) 100 UNIT/ML injection Inject 5-8 Units   into the skin 3 (three) times daily before meals. Per sliding scale.      . nortriptyline (PAMELOR) 10 MG capsule Take 1 capsule (10 mg total) by mouth at bedtime.  30 capsule  3  . oxyCODONE-acetaminophen (PERCOCET) 7.5-325 MG per tablet Take 1 tablet by mouth every 6 (six) hours as needed. For pain.      . simvastatin (ZOCOR) 80 MG tablet Take 1 tablet (80 mg total) by mouth at bedtime.  90 tablet  3  . sodium bicarbonate 650 MG tablet Take 2 tablets (1,300 mg total) by mouth 2 (two) times daily.  120 tablet  6  . sodium polystyrene (KAYEXALATE) 15 GM/60ML suspension Take 15 g by mouth daily.       . DISCONTD: tiotropium (SPIRIVA HANDIHALER) 18 MCG inhalation capsule Place 1 capsule (18 mcg total) into inhaler and inhale daily.  30 capsule  2    REVIEW OF SYSTEMS: [X ] denotes positive finding; [  ] denotes negative finding CARDIOVASCULAR:  [ ] chest pain   [ ] chest pressure   [ ]  palpitations   [ ] orthopnea   [ ] dyspnea on exertion   [X ] claudication   [ ] rest pain   [ ] DVT   [ ] phlebitis PULMONARY:   [ ] productive cough   [ ] asthma   [ ] wheezing NEUROLOGIC:   [ ] weakness  [ ] paresthesias  [ ] aphasia  [ ] amaurosis  [ ] dizziness HEMATOLOGIC:   [ ] bleeding problems   [ ] clotting disorders MUSCULOSKELETAL:  [ ] joint pain   [ ] joint swelling [ ] leg swelling GASTROINTESTINAL: [ ]  blood in stool  [ ]  hematemesis GENITOURINARY:  [ ]  dysuria  [ ]  hematuria PSYCHIATRIC:  [ ] history of major depression INTEGUMENTARY:  [ ] rashes  [ ] ulcers CONSTITUTIONAL:  [ ] fever   [ ] chills  PHYSICAL EXAM: Filed Vitals:   12/03/11 1413 12/03/11 1426  BP: 149/85 134/79  Pulse: 99 98  Temp: 98.4 F (36.9 C)   TempSrc: Oral   Resp:  12  Height: 5' 9" (1.753 m)   Weight: 200 lb (90.719 kg)   SpO2: 99%    Body mass index is 29.53 kg/(m^2). GENERAL: The patient is a well-nourished male, in no acute distress. The vital signs are documented above. CARDIOVASCULAR: There is a regular rate and rhythm without significant murmur appreciated. He has bilateral carotid bruits. He has palpable femoral pulses. Both feet are warm and adequately perfused. PULMONARY: There is good air exchange bilaterally without wheezing or rales. ABDOMEN: Soft and non-tender with normal pitched bowel sounds.  MUSCULOSKELETAL: There are no major deformities or cyanosis. NEUROLOGIC: No focal weakness or paresthesias are detected. SKIN: There are no ulcers or rashes noted. The right great toe wound is slightly dark on the medial aspect but there is no open wound or drainage. There is no erythema. PSYCHIATRIC: The patient has a normal affect.  DATA:  I have independently interpreted his carotid duplex scan which shows a greater than 80% left carotid stenosis with no significant stenosis on the right. Both vertebral arteries have antegrade flow. His left arm pressures higher than his right arm  pressure.  I have reviewed his arteriogram as performed by Dr. Charles Fields. This shows an occluded right superficial femoral artery with reconstitution of the above-knee popliteal artery and three-vessel runoff on the right.    MEDICAL ISSUES:  Occlusion and stenosis of carotid artery without mention of cerebral infarction Carotid duplex scan today shows a greater than 80% left carotid stenosis. He is asymptomatic. However, given the severity of stenosis I have recommended left carotid endarterectomy in order to lower his risk of future stroke. I have reviewed the indications for carotid endarterectomy, that is to lower the risk of future stroke. I have also reviewed the potential complications of surgery, including but not limited to: bleeding, stroke (perioperative risk 1-2%), MI, nerve injury of other unpredictable medical problems. All of the patients questions were answered and they are agreeable to proceed with surgery.  The surgery is scheduled for July 11. He knows to continue his aspirin right up through surgery.   Atherosclerosis of native arteries of the extremities with intermittent claudication The patient's right great toe wound has continued to improve. His claudication symptoms in the right are stable. For this reason at this point I would not recommend revascularization unless the toe wound returned. He appears to be a candidate for a fem to above-knee popliteal artery bypass graft on the right if he develops progressive symptoms. Of note vein map of the right shows that the vein is somewhat marginal in size. We'll continue to follow his peripheral vascular disease and I'll plan on seeing him back for this in 2 weeks after his carotid endarterectomy in in 6 months after that. We have again discussed the importance of tobacco cessation.   Dhiya Smits S [facility] [place] Office: [phone]   

## 2011-12-04 ENCOUNTER — Other Ambulatory Visit: Payer: Self-pay | Admitting: *Deleted

## 2011-12-05 ENCOUNTER — Ambulatory Visit: Payer: Medicare Other | Admitting: Family Medicine

## 2011-12-06 ENCOUNTER — Encounter (HOSPITAL_COMMUNITY): Payer: Self-pay | Admitting: Pharmacy Technician

## 2011-12-08 ENCOUNTER — Telehealth: Payer: Self-pay | Admitting: Family Medicine

## 2011-12-08 ENCOUNTER — Encounter: Payer: Self-pay | Admitting: Family Medicine

## 2011-12-08 ENCOUNTER — Telehealth: Payer: Self-pay

## 2011-12-08 ENCOUNTER — Ambulatory Visit (INDEPENDENT_AMBULATORY_CARE_PROVIDER_SITE_OTHER): Payer: No Typology Code available for payment source | Admitting: Family Medicine

## 2011-12-08 VITALS — BP 160/74 | HR 101 | Temp 98.4°F | Ht 69.0 in | Wt 199.0 lb

## 2011-12-08 DIAGNOSIS — I10 Essential (primary) hypertension: Secondary | ICD-10-CM

## 2011-12-08 DIAGNOSIS — E1165 Type 2 diabetes mellitus with hyperglycemia: Secondary | ICD-10-CM

## 2011-12-08 DIAGNOSIS — E118 Type 2 diabetes mellitus with unspecified complications: Secondary | ICD-10-CM

## 2011-12-08 DIAGNOSIS — R42 Dizziness and giddiness: Secondary | ICD-10-CM

## 2011-12-08 DIAGNOSIS — I6529 Occlusion and stenosis of unspecified carotid artery: Secondary | ICD-10-CM

## 2011-12-08 MED ORDER — METOPROLOL SUCCINATE ER 25 MG PO TB24: 25.0000 mg | ORAL_TABLET | Freq: Every day | ORAL | Status: DC

## 2011-12-08 NOTE — Progress Notes (Signed)
  Subjective:    Patient ID: John [name], male    DOB: September 17, 1955, 56 y.o.   MRN: [id number]  HPI  John [name] comes in for an episode of light-headedness that happened this morning.  He is scheduled for a right carotid endarterectomy on the July 11th, and the surgeon told him that if he has any vision changes, light headedness, or stroke-like symptoms he needed to see a doctor.  The episode lasted about 20 minutes.  He says he checked his blood pressure three times after this happened, and it was 150/94, then 170/95, then 140/85.  He says the symptoms have completely resolved.  He denies seeing any spots or vision changes.  He denies any slurred speech, facial droop, weakness.    He says his blood sugar this morning was 216, and he is taking all his medications as prescribed.    Past Medical History  Diagnosis Date  . COPD (chronic obstructive pulmonary disease)   . Chronic back pain     Chronic narcotics for this for many years  . Diabetes mellitus   . Chronic kidney disease     CKD stage III  . Hyperlipidemia   . Hypertension   . Foot pain   . Stroke    Family History  Problem Relation Age of Onset  . Diabetes Mother   . Heart disease Mother   . Hypertension Mother   . Heart attack Mother   . Diabetes Father   . Heart disease Father   . Hypertension Father   . Heart attack Father   . Other Father     bleeding problems  . Diabetes Brother   . Heart disease Brother    History  Substance Use Topics  . Smoking status: Current Everyday Smoker -- 0.5 packs/day for 40 years    Types: Cigarettes  . Smokeless tobacco: Never Used   Comment: smoking cessation info given and reviewed  . Alcohol Use: No     Previous drinker - However, quit in 2006   Review of Systems Pertinent items in HPI.     Objective:   Physical Exam  Constitutional: He is oriented to person, place, and time. He appears well-developed and well-nourished. No distress.  HENT:  Head: Normocephalic and  atraumatic.  Mouth/Throat: Oropharynx is clear and moist.  Eyes: EOM are normal. Pupils are equal, round, and reactive to light.  Neurological: He is alert and oriented to person, place, and time. He has normal strength and normal reflexes. No cranial nerve deficit or sensory deficit.   Orthostatic Vitals: Lying   Sitting    Standing Filed Vitals:   12/08/11 1510 12/08/11 1511 12/08/11 1514  BP: 161/79 151/84 160/74  Pulse: 97 102 101  Temp: 98.4 F (36.9 C)    TempSrc: Oral    Height: 5\' 9"  (1.753 m)    Weight: 199 lb (90.266 kg)           Assessment & Plan:

## 2011-12-08 NOTE — Assessment & Plan Note (Signed)
Short, one time episode today.  Suspect this was due to elevated BP.  Will treat BP, f/u in one week for BP check.  Reviewed warning signs for stroke for which to seek immediate care.

## 2011-12-08 NOTE — Assessment & Plan Note (Signed)
Blood sugar was 216 just prior to light-headedness.  A1c has been elevated, suspect this is about where his blood sugar runs.  Do not think blood sugar contributed to symptoms today.  Encouraged him to cut down on carbs.

## 2011-12-08 NOTE — Telephone Encounter (Signed)
Patient due for surgery on July 11th and was having some dizziness and elevation of bp.  His levels today was 169/94 at 12:30 and was taken again at 1:00 and was 170/114.  Patient needed to know if there was anything he needed to do or to see provider before appt.

## 2011-12-08 NOTE — Assessment & Plan Note (Signed)
Only complaint today was light-headedness.  No complaints consistent with emboli.

## 2011-12-08 NOTE — Patient Instructions (Signed)
Thank you for coming in today.  Your blood pressure is still a little high, this is probably what caused you to feel badly this morning.  I want you to start taking another blood pressure medication called metoprolol.  Please come in for a nurse visit next Monday to have your blood pressure checked.

## 2011-12-08 NOTE — Telephone Encounter (Signed)
Phone call from pt.  C/o feeling light-headed x 1 hr., since he got out of bed.  Denies any change in vision, weakness of extremities, numbness, difficulty expressing thoughts, is alert to person, place, and time.  BP 169/94,170/114.  Discussed with Dr. Myra Gianotti.  Advised to make Dr. Edilia Bo aware.  Advised that pt. Should call his PCP and inform him of symptoms.  Pt. Given recommendation per Dr. Myra Gianotti.  Verb. Understanding and states will call his PCP.  Encouraged pt. To call office back if worsening symptoms. Pt. States his light-headed symptoms have improved somewhat after about 2 hrs. Verb. understanding of instructions.

## 2011-12-08 NOTE — Assessment & Plan Note (Addendum)
Elevated- think this is most likely cause of symptoms- also pulse upper limit of normal.  Will start metoprolol for better BP control and cardiac protection prior to surgery.  Nurse visit in one week for BP check.

## 2011-12-08 NOTE — Telephone Encounter (Signed)
Spoke with patient and advised him to come to office now . He is agreeable.

## 2011-12-09 ENCOUNTER — Encounter: Payer: Medicare Other | Admitting: Vascular Surgery

## 2011-12-10 ENCOUNTER — Encounter (HOSPITAL_COMMUNITY)
Admission: RE | Admit: 2011-12-10 | Discharge: 2011-12-10 | Disposition: A | Discharge status: Home / Self Care | Payer: Medicare Other | Source: Ambulatory Visit | Attending: Anesthesiology | Admitting: Anesthesiology

## 2011-12-10 ENCOUNTER — Encounter (HOSPITAL_COMMUNITY): Payer: Self-pay

## 2011-12-10 ENCOUNTER — Inpatient Hospital Stay (HOSPITAL_COMMUNITY): Admission: RE | Admit: 2011-12-10 | Payer: Medicare Other | Source: Ambulatory Visit

## 2011-12-10 ENCOUNTER — Encounter (HOSPITAL_COMMUNITY)
Admission: RE | Admit: 2011-12-10 | Discharge: 2011-12-10 | Disposition: A | Discharge status: Home / Self Care | Payer: Medicare Other | Source: Ambulatory Visit | Attending: Vascular Surgery | Admitting: Vascular Surgery

## 2011-12-10 HISTORY — DX: Unspecified asthma, uncomplicated: J45.909

## 2011-12-10 HISTORY — DX: Peripheral vascular disease, unspecified: I73.9

## 2011-12-10 HISTORY — DX: Reserved for concepts with insufficient information to code with codable children: IMO0002

## 2011-12-10 HISTORY — DX: Unspecified fracture of unspecified foot, initial encounter for closed fracture: S92.909A

## 2011-12-10 LAB — APTT: aPTT: 27 seconds (ref 24–37)

## 2011-12-10 LAB — SURGICAL PCR SCREEN
MRSA, PCR: NEGATIVE
Staphylococcus aureus: NEGATIVE

## 2011-12-10 LAB — URINALYSIS, ROUTINE W REFLEX MICROSCOPIC
Glucose, UA: NEGATIVE mg/dL
Hgb urine dipstick: NEGATIVE
Leukocytes, UA: NEGATIVE
Specific Gravity, Urine: 1.011 (ref 1.005–1.030)
pH: 5.5 (ref 5.0–8.0)

## 2011-12-10 LAB — COMPREHENSIVE METABOLIC PANEL
ALT: 11 U/L (ref 0–53)
Albumin: 3.5 g/dL (ref 3.5–5.2)
Alkaline Phosphatase: 214 U/L — ABNORMAL HIGH (ref 39–117)
BUN: 49 mg/dL — ABNORMAL HIGH (ref 6–23)
Chloride: 108 mEq/L (ref 96–112)
Potassium: 5.6 mEq/L — ABNORMAL HIGH (ref 3.5–5.1)
Sodium: 140 mEq/L (ref 135–145)
Total Bilirubin: 0.2 mg/dL — ABNORMAL LOW (ref 0.3–1.2)
Total Protein: 6.7 g/dL (ref 6.0–8.3)

## 2011-12-10 LAB — PROTIME-INR
INR: 0.98 (ref 0.00–1.49)
Prothrombin Time: 13.2 seconds (ref 11.6–15.2)

## 2011-12-10 LAB — CBC
HCT: 40 % (ref 39.0–52.0)
Hemoglobin: 13 g/dL (ref 13.0–17.0)
MCHC: 32.5 g/dL (ref 30.0–36.0)
RDW: 14.7 % (ref 11.5–15.5)
WBC: 13.1 10*3/uL — ABNORMAL HIGH (ref 4.0–10.5)

## 2011-12-10 LAB — URINE MICROSCOPIC-ADD ON

## 2011-12-10 NOTE — Pre-Procedure Instructions (Signed)
20 ROALD LUKACS  12/10/2011   Your procedure is scheduled on: Thursday, July 11th  Report to [facility] at 7:30 AM.  Call this number if you have problems the morning of surgery: [phone]   Remember:   Do not eat food or drink:After Midnight.   Take these medicines the morning of surgery with A SIP OF WATER: Metoprolol (Toprol XL), Amlodipine (Norvasc), Metoprolol (Nrvasc).  MAy take Oxycodone- Acetaminophen (Percocet) if needed.  Use Albuterol and bring to the hospital with you.   Do not wear jewelry, make-up or nail polish.  Do not wear lotions, powders, or perfumes. You may wear deodorant.  Do not shave 48 hours prior to surgery. Men may shave face and neck.  Do not bring valuables to the hospital.  Contacts, dentures or bridgework may not be worn into surgery.  Leave suitcase in the car. After surgery it may be brought to your room.  For patients admitted to the hospital, checkout time is 11:00 AM the day of discharge.   Patients discharged the day of surgery will not be allowed to drive home.  Name and phone number of your driver: NA  Special Instructions: CHG Shower Use Special Wash: 1/2 bottle night before surgery and 1/2 bottle morning of surgery.   Please read over the following fact sheets that you were given: Pain Booklet, Coughing and Deep Breathing and Surgical Site Infection Prevention

## 2011-12-15 ENCOUNTER — Ambulatory Visit (INDEPENDENT_AMBULATORY_CARE_PROVIDER_SITE_OTHER): Payer: Medicare Other | Admitting: *Deleted

## 2011-12-15 VITALS — BP 130/74 | HR 84

## 2011-12-15 DIAGNOSIS — I10 Essential (primary) hypertension: Secondary | ICD-10-CM

## 2011-12-15 NOTE — Progress Notes (Signed)
Patient in for BP check. States he is having surgery 07/11.   BP checked manually using regular adult cuff. BP LA 130/70and RA 130/74 pulse 84. Will forward to Dr. Aviva Signs and Dr. Lula Olszewski.

## 2011-12-17 MED ORDER — DEXTROSE 5 % IV SOLN
1.5000 g | INTRAVENOUS | Status: AC
  Administered 2011-12-18: 1.5 g via INTRAVENOUS
  Filled 2011-12-17: qty 1.5

## 2011-12-17 NOTE — Procedures (Unsigned)
CAROTID DUPLEX EXAM  INDICATION:  Bruit.  HISTORY: Diabetes:  Yes Cardiac:  No Hypertension:  Yes Smoking:  Currently Previous Surgery:  No carotid intervention CV History:  Currently experiencing numbness Amaurosis Fugax No, Paresthesias Yes, Hemiparesis No                                      RIGHT             LEFT Brachial systolic pressure:         128               148 Brachial Doppler waveforms:         WNL               WNL Vertebral direction of flow:        Antegrade/blunted Antegrade DUPLEX VELOCITIES (cm/sec) CCA peak systolic                   115               119 - M (128-D/234- D) ECA peak systolic                   123               152 ICA peak systolic                   65                346 ICA end diastolic                   22                122 PLAQUE MORPHOLOGY:                  Homogenous        Homogenous PLAQUE AMOUNT:                      Mild              Severe PLAQUE LOCATION:                    CCA/ICA           CCA/ICA  IMPRESSION: 1. Bilateral common carotid artery disease present, left greater than     right. 2. Right internal carotid artery stenosis present in the 1-39% range. 3. Bilateral external carotid arteries appear patent. 4. Left internal carotid artery stenosis present in the 80-99% range. 5. Right vertebral is blunted, antegrade and considered abnormal. 6. Left vertebral artery is patent and antegrade.  ___________________________________________ Di Kindle. Edilia Bo, M.D.  SH/MEDQ  D:  12/03/2011  T:  12/03/2011  Job:  [id number]

## 2011-12-17 NOTE — Procedures (Unsigned)
VASCULAR LAB EXAM  INDICATION:  Lower extremity claudication, preoperative cardiovascular exam.  HISTORY: Diabetes:  Yes. Cardiac:  No. Hypertension:  Yes.  EXAM:  Right lower extremity vein mapping for right lower extremity bypass graft.  IMPRESSION: 1. Patent and compressible right great saphenous vein with diameter     measurements ranging from 0.21 cm to 1.09 cm. 2. Patent right small saphenous vein with partial thrombus present at     2 valve sinuses.  The thrombus appears chronic. 3. Right small saphenous vein diameter measurements range from 0.21 cm     to 0.30 cm.  ___________________________________________ Di Kindle. Edilia Bo, M.D.  SH/MEDQ  D:  12/03/2011  T:  12/03/2011  Job:  [id number]

## 2011-12-18 ENCOUNTER — Encounter (HOSPITAL_COMMUNITY)
Admission: RE | Disposition: A | Discharge status: Home / Self Care | Payer: Self-pay | Source: Ambulatory Visit | Attending: Vascular Surgery

## 2011-12-18 ENCOUNTER — Encounter (HOSPITAL_COMMUNITY): Payer: Self-pay | Admitting: Anesthesiology

## 2011-12-18 ENCOUNTER — Encounter (HOSPITAL_COMMUNITY): Payer: Self-pay | Admitting: Surgery

## 2011-12-18 ENCOUNTER — Ambulatory Visit (HOSPITAL_COMMUNITY): Payer: Medicare Other | Admitting: Anesthesiology

## 2011-12-18 ENCOUNTER — Inpatient Hospital Stay (HOSPITAL_COMMUNITY)
Admission: RE | Admit: 2011-12-18 | Discharge: 2011-12-21 | DRG: 039 | Disposition: A | Discharge status: Home / Self Care | Payer: Medicare Other | Source: Ambulatory Visit | Attending: Vascular Surgery | Admitting: Vascular Surgery

## 2011-12-18 DIAGNOSIS — I6529 Occlusion and stenosis of unspecified carotid artery: Principal | ICD-10-CM | POA: Diagnosis present

## 2011-12-18 DIAGNOSIS — F17200 Nicotine dependence, unspecified, uncomplicated: Secondary | ICD-10-CM | POA: Diagnosis present

## 2011-12-18 DIAGNOSIS — E875 Hyperkalemia: Secondary | ICD-10-CM | POA: Diagnosis present

## 2011-12-18 DIAGNOSIS — J4489 Other specified chronic obstructive pulmonary disease: Secondary | ICD-10-CM | POA: Diagnosis present

## 2011-12-18 DIAGNOSIS — Z8673 Personal history of transient ischemic attack (TIA), and cerebral infarction without residual deficits: Secondary | ICD-10-CM

## 2011-12-18 DIAGNOSIS — Z7982 Long term (current) use of aspirin: Secondary | ICD-10-CM

## 2011-12-18 DIAGNOSIS — Z794 Long term (current) use of insulin: Secondary | ICD-10-CM

## 2011-12-18 DIAGNOSIS — E119 Type 2 diabetes mellitus without complications: Secondary | ICD-10-CM | POA: Diagnosis present

## 2011-12-18 DIAGNOSIS — I129 Hypertensive chronic kidney disease with stage 1 through stage 4 chronic kidney disease, or unspecified chronic kidney disease: Secondary | ICD-10-CM | POA: Diagnosis present

## 2011-12-18 DIAGNOSIS — M549 Dorsalgia, unspecified: Secondary | ICD-10-CM | POA: Diagnosis present

## 2011-12-18 DIAGNOSIS — N183 Chronic kidney disease, stage 3 unspecified: Secondary | ICD-10-CM | POA: Diagnosis present

## 2011-12-18 DIAGNOSIS — J449 Chronic obstructive pulmonary disease, unspecified: Secondary | ICD-10-CM | POA: Diagnosis present

## 2011-12-18 DIAGNOSIS — E785 Hyperlipidemia, unspecified: Secondary | ICD-10-CM | POA: Diagnosis present

## 2011-12-18 DIAGNOSIS — Z833 Family history of diabetes mellitus: Secondary | ICD-10-CM

## 2011-12-18 DIAGNOSIS — I70219 Atherosclerosis of native arteries of extremities with intermittent claudication, unspecified extremity: Secondary | ICD-10-CM | POA: Diagnosis present

## 2011-12-18 HISTORY — PX: ENDARTERECTOMY: SHX5162

## 2011-12-18 LAB — GLUCOSE, CAPILLARY
Glucose-Capillary: 112 mg/dL — ABNORMAL HIGH (ref 70–99)
Glucose-Capillary: 113 mg/dL — ABNORMAL HIGH (ref 70–99)

## 2011-12-18 SURGERY — ENDARTERECTOMY, CAROTID
Anesthesia: General | Site: Neck | Laterality: Left | Wound class: Clean

## 2011-12-18 MED ORDER — SODIUM BICARBONATE 650 MG PO TABS
1300.0000 mg | ORAL_TABLET | Freq: Two times a day (BID) | ORAL | Status: DC
  Administered 2011-12-18 – 2011-12-21 (×6): 1300 mg via ORAL
  Filled 2011-12-18 (×7): qty 2

## 2011-12-18 MED ORDER — HYDROMORPHONE HCL PF 1 MG/ML IJ SOLN
INTRAMUSCULAR | Status: AC
  Filled 2011-12-18: qty 1

## 2011-12-18 MED ORDER — OXYCODONE-ACETAMINOPHEN 7.5-325 MG PO TABS
1.0000 | ORAL_TABLET | Freq: Four times a day (QID) | ORAL | Status: DC | PRN
  Filled 2011-12-18: qty 1

## 2011-12-18 MED ORDER — ACETAMINOPHEN 325 MG PO TABS: 325.0000 mg | ORAL_TABLET | ORAL | Status: DC | PRN

## 2011-12-18 MED ORDER — THROMBIN 20000 UNITS EX SOLR
CUTANEOUS | Status: AC
  Filled 2011-12-18: qty 20000

## 2011-12-18 MED ORDER — ROCURONIUM BROMIDE 100 MG/10ML IV SOLN
INTRAVENOUS | Status: DC | PRN
  Administered 2011-12-18: 50 mg via INTRAVENOUS

## 2011-12-18 MED ORDER — ONDANSETRON HCL 4 MG/2ML IJ SOLN
4.0000 mg | Freq: Four times a day (QID) | INTRAMUSCULAR | Status: DC | PRN
  Administered 2011-12-18: 4 mg via INTRAVENOUS
  Filled 2011-12-18: qty 2

## 2011-12-18 MED ORDER — ACETAMINOPHEN 650 MG RE SUPP: 325.0000 mg | RECTAL | Status: DC | PRN

## 2011-12-18 MED ORDER — ALBUTEROL SULFATE HFA 108 (90 BASE) MCG/ACT IN AERS: 2.0000 | INHALATION_SPRAY | Freq: Four times a day (QID) | RESPIRATORY_TRACT | Status: DC | PRN

## 2011-12-18 MED ORDER — DEXTRAN 40 IN SALINE 10-0.9 % IV SOLN
INTRAVENOUS | Status: AC
  Filled 2011-12-18: qty 500

## 2011-12-18 MED ORDER — LIDOCAINE-EPINEPHRINE (PF) 1 %-1:200000 IJ SOLN
INTRAMUSCULAR | Status: DC | PRN
  Administered 2011-12-18: 30 mL

## 2011-12-18 MED ORDER — HYDRALAZINE HCL 20 MG/ML IJ SOLN: 10.0000 mg | INTRAMUSCULAR | Status: DC | PRN

## 2011-12-18 MED ORDER — MORPHINE SULFATE 2 MG/ML IJ SOLN
INTRAMUSCULAR | Status: AC
  Administered 2011-12-18: 2 mg via INTRAVENOUS
  Filled 2011-12-18: qty 1

## 2011-12-18 MED ORDER — PROPOFOL 10 MG/ML IV EMUL
INTRAVENOUS | Status: DC | PRN
  Administered 2011-12-18: 140 mg via INTRAVENOUS

## 2011-12-18 MED ORDER — DOPAMINE-DEXTROSE 3.2-5 MG/ML-% IV SOLN: 3.0000 ug/kg/min | INTRAVENOUS | Status: DC | PRN

## 2011-12-18 MED ORDER — SODIUM POLYSTYRENE SULFONATE 15 GM/60ML PO SUSP
15.0000 g | Freq: Every day | ORAL | Status: DC
  Administered 2011-12-19 – 2011-12-21 (×3): 15 g via ORAL
  Filled 2011-12-18 (×3): qty 60

## 2011-12-18 MED ORDER — METOPROLOL SUCCINATE ER 25 MG PO TB24
25.0000 mg | ORAL_TABLET | Freq: Every day | ORAL | Status: DC
  Administered 2011-12-19 – 2011-12-21 (×3): 25 mg via ORAL
  Filled 2011-12-18 (×3): qty 1

## 2011-12-18 MED ORDER — ASPIRIN EC 325 MG PO TBEC: 325.0000 mg | DELAYED_RELEASE_TABLET | Freq: Every day | ORAL | Status: DC

## 2011-12-18 MED ORDER — NORTRIPTYLINE HCL 10 MG PO CAPS
10.0000 mg | ORAL_CAPSULE | Freq: Every day | ORAL | Status: DC
  Administered 2011-12-18 – 2011-12-20 (×3): 10 mg via ORAL
  Filled 2011-12-18 (×4): qty 1

## 2011-12-18 MED ORDER — ASPIRIN EC 325 MG PO TBEC
325.0000 mg | DELAYED_RELEASE_TABLET | Freq: Every day | ORAL | Status: DC
  Administered 2011-12-19 – 2011-12-21 (×3): 325 mg via ORAL
  Filled 2011-12-18 (×3): qty 1

## 2011-12-18 MED ORDER — DOCUSATE SODIUM 100 MG PO CAPS
100.0000 mg | ORAL_CAPSULE | Freq: Every day | ORAL | Status: DC
  Administered 2011-12-19 – 2011-12-21 (×3): 100 mg via ORAL
  Filled 2011-12-18 (×3): qty 1

## 2011-12-18 MED ORDER — PHENOL 1.4 % MT LIQD: 1.0000 | OROMUCOSAL | Status: DC | PRN

## 2011-12-18 MED ORDER — ONDANSETRON HCL 4 MG/2ML IJ SOLN: 4.0000 mg | Freq: Once | INTRAMUSCULAR | Status: DC | PRN

## 2011-12-18 MED ORDER — INSULIN LISPRO 100 UNIT/ML ~~LOC~~ SOLN
5.0000 [IU] | Freq: Two times a day (BID) | SUBCUTANEOUS | Status: DC
  Filled 2011-12-18: qty 3

## 2011-12-18 MED ORDER — PROTAMINE SULFATE 10 MG/ML IV SOLN
INTRAVENOUS | Status: DC | PRN
  Administered 2011-12-18: 30 mg via INTRAVENOUS

## 2011-12-18 MED ORDER — THROMBIN 20000 UNITS EX SOLR
CUTANEOUS | Status: DC | PRN
  Administered 2011-12-18: 20000 [IU] via TOPICAL

## 2011-12-18 MED ORDER — SODIUM CHLORIDE 0.9 % IV SOLN
500.0000 mL | Freq: Once | INTRAVENOUS | Status: AC | PRN
  Administered 2011-12-18: 500 mL via INTRAVENOUS

## 2011-12-18 MED ORDER — ATORVASTATIN CALCIUM 40 MG PO TABS
40.0000 mg | ORAL_TABLET | Freq: Every day | ORAL | Status: DC
  Administered 2011-12-18 – 2011-12-20 (×3): 40 mg via ORAL
  Filled 2011-12-18 (×4): qty 1

## 2011-12-18 MED ORDER — LIDOCAINE HCL (CARDIAC) 20 MG/ML IV SOLN
INTRAVENOUS | Status: DC | PRN
  Administered 2011-12-18: 80 mg via INTRAVENOUS

## 2011-12-18 MED ORDER — GLYCOPYRROLATE 0.2 MG/ML IJ SOLN
INTRAMUSCULAR | Status: DC | PRN
  Administered 2011-12-18: .2 mg via INTRAVENOUS

## 2011-12-18 MED ORDER — DEXTROSE 5 % IV SOLN
1.5000 g | Freq: Two times a day (BID) | INTRAVENOUS | Status: AC
  Administered 2011-12-18 – 2011-12-19 (×2): 1.5 g via INTRAVENOUS
  Filled 2011-12-18 (×2): qty 1.5

## 2011-12-18 MED ORDER — INSULIN GLARGINE 100 UNIT/ML ~~LOC~~ SOLN
30.0000 [IU] | Freq: Every morning | SUBCUTANEOUS | Status: DC
  Administered 2011-12-19 – 2011-12-21 (×3): 30 [IU] via SUBCUTANEOUS

## 2011-12-18 MED ORDER — NEOSTIGMINE METHYLSULFATE 1 MG/ML IJ SOLN
INTRAMUSCULAR | Status: DC | PRN
  Administered 2011-12-18: 2 mg via INTRAVENOUS

## 2011-12-18 MED ORDER — OXYCODONE-ACETAMINOPHEN 7.5-325 MG PO TABS: 1.0000 | ORAL_TABLET | Freq: Four times a day (QID) | ORAL | Status: DC | PRN

## 2011-12-18 MED ORDER — AMLODIPINE BESYLATE 10 MG PO TABS
10.0000 mg | ORAL_TABLET | Freq: Every day | ORAL | Status: DC
  Administered 2011-12-19 – 2011-12-21 (×3): 10 mg via ORAL
  Filled 2011-12-18 (×3): qty 1

## 2011-12-18 MED ORDER — ONDANSETRON HCL 4 MG/2ML IJ SOLN
INTRAMUSCULAR | Status: DC | PRN
  Administered 2011-12-18: 4 mg via INTRAVENOUS

## 2011-12-18 MED ORDER — PANTOPRAZOLE SODIUM 40 MG PO TBEC
40.0000 mg | DELAYED_RELEASE_TABLET | Freq: Every day | ORAL | Status: DC
  Administered 2011-12-18 – 2011-12-20 (×3): 40 mg via ORAL
  Filled 2011-12-18 (×3): qty 1

## 2011-12-18 MED ORDER — HYDROMORPHONE HCL PF 1 MG/ML IJ SOLN
0.2500 mg | INTRAMUSCULAR | Status: DC | PRN
  Administered 2011-12-18: 0.25 mg via INTRAVENOUS

## 2011-12-18 MED ORDER — SODIUM CHLORIDE 0.9 % IV SOLN: INTRAVENOUS | Status: DC

## 2011-12-18 MED ORDER — HEPARIN SODIUM (PORCINE) 1000 UNIT/ML IJ SOLN
INTRAMUSCULAR | Status: DC | PRN
  Administered 2011-12-18: 1000 [IU] via INTRAVENOUS

## 2011-12-18 MED ORDER — GUAIFENESIN-DM 100-10 MG/5ML PO SYRP: 15.0000 mL | ORAL_SOLUTION | ORAL | Status: DC | PRN

## 2011-12-18 MED ORDER — SODIUM CHLORIDE 0.9 % IR SOLN
Status: DC | PRN
  Administered 2011-12-18: 500 mL

## 2011-12-18 MED ORDER — THROMBIN 20000 UNITS EX KIT
PACK | CUTANEOUS | Status: DC | PRN
  Administered 2011-12-18: 13:00:00 via TOPICAL

## 2011-12-18 MED ORDER — LACTATED RINGERS IV SOLN
INTRAVENOUS | Status: DC | PRN
  Administered 2011-12-18: 10:00:00 via INTRAVENOUS

## 2011-12-18 MED ORDER — LIDOCAINE HCL 4 % MT SOLN
OROMUCOSAL | Status: DC | PRN
  Administered 2011-12-18: 4 mL via TOPICAL

## 2011-12-18 MED ORDER — FENTANYL CITRATE 0.05 MG/ML IJ SOLN
INTRAMUSCULAR | Status: DC | PRN
  Administered 2011-12-18: 150 ug via INTRAVENOUS
  Administered 2011-12-18: 50 ug via INTRAVENOUS

## 2011-12-18 MED ORDER — INSULIN ASPART 100 UNIT/ML ~~LOC~~ SOLN
5.0000 [IU] | Freq: Two times a day (BID) | SUBCUTANEOUS | Status: DC
  Administered 2011-12-19: 5 [IU] via SUBCUTANEOUS

## 2011-12-18 MED ORDER — LABETALOL HCL 5 MG/ML IV SOLN: 10.0000 mg | INTRAVENOUS | Status: DC | PRN

## 2011-12-18 MED ORDER — FUROSEMIDE 40 MG PO TABS
40.0000 mg | ORAL_TABLET | Freq: Every day | ORAL | Status: DC
  Administered 2011-12-19 – 2011-12-21 (×3): 40 mg via ORAL
  Filled 2011-12-18 (×3): qty 1

## 2011-12-18 MED ORDER — PHENYLEPHRINE HCL 10 MG/ML IJ SOLN
10.0000 mg | INTRAVENOUS | Status: DC | PRN
  Administered 2011-12-18: 25 ug/min via INTRAVENOUS

## 2011-12-18 MED ORDER — LIDOCAINE HCL (PF) 1 % IJ SOLN
INTRAMUSCULAR | Status: AC
  Filled 2011-12-18: qty 30

## 2011-12-18 MED ORDER — BACLOFEN 10 MG PO TABS
10.0000 mg | ORAL_TABLET | Freq: Two times a day (BID) | ORAL | Status: DC
  Administered 2011-12-19 – 2011-12-21 (×5): 10 mg via ORAL
  Filled 2011-12-18 (×6): qty 1

## 2011-12-18 MED ORDER — LIDOCAINE-EPINEPHRINE (PF) 1 %-1:200000 IJ SOLN
INTRAMUSCULAR | Status: AC
  Filled 2011-12-18: qty 10

## 2011-12-18 MED ORDER — MORPHINE SULFATE 2 MG/ML IJ SOLN
2.0000 mg | INTRAMUSCULAR | Status: DC | PRN
  Administered 2011-12-18: 4 mg via INTRAVENOUS
  Administered 2011-12-19 – 2011-12-20 (×2): 2 mg via INTRAVENOUS
  Filled 2011-12-18 (×2): qty 1
  Filled 2011-12-18: qty 2

## 2011-12-18 MED ORDER — INSULIN ASPART 100 UNIT/ML ~~LOC~~ SOLN
0.0000 [IU] | Freq: Three times a day (TID) | SUBCUTANEOUS | Status: DC
  Administered 2011-12-19: 3 [IU] via SUBCUTANEOUS
  Administered 2011-12-19 – 2011-12-20 (×3): 2 [IU] via SUBCUTANEOUS

## 2011-12-18 MED ORDER — DEXTROSE 5 % IV SOLN
1.5000 g | Freq: Two times a day (BID) | INTRAVENOUS | Status: DC
  Filled 2011-12-18 (×2): qty 1.5

## 2011-12-18 MED ORDER — SODIUM CHLORIDE 0.9 % IV SOLN
INTRAVENOUS | Status: DC | PRN
  Administered 2011-12-18 (×2): via INTRAVENOUS

## 2011-12-18 MED ORDER — SODIUM CHLORIDE 0.9 % IV SOLN
INTRAVENOUS | Status: DC
  Administered 2011-12-18: 22:00:00 via INTRAVENOUS
  Administered 2011-12-18: 100 mL/h via INTRAVENOUS

## 2011-12-18 MED ORDER — 0.9 % SODIUM CHLORIDE (POUR BTL) OPTIME
TOPICAL | Status: DC | PRN
  Administered 2011-12-18 (×2): 1000 mL

## 2011-12-18 MED ORDER — HEPARIN SODIUM (PORCINE) 1000 UNIT/ML IJ SOLN
INTRAMUSCULAR | Status: DC | PRN
  Administered 2011-12-18: 9000 [IU] via INTRAVENOUS

## 2011-12-18 MED ORDER — METOPROLOL TARTRATE 1 MG/ML IV SOLN: 2.0000 mg | INTRAVENOUS | Status: DC | PRN

## 2011-12-18 MED ORDER — HEPARIN SODIUM (PORCINE) 1000 UNIT/ML IJ SOLN
INTRAMUSCULAR | Status: AC
  Filled 2011-12-18: qty 1

## 2011-12-18 MED ORDER — DEXTRAN 40 IN SALINE 10-0.9 % IV SOLN
INTRAVENOUS | Status: DC | PRN
  Administered 2011-12-18: 25 mL/h

## 2011-12-18 SURGICAL SUPPLY — 55 items
ADH SKN CLS APL DERMABOND .7 (GAUZE/BANDAGES/DRESSINGS) ×1
BAG DECANTER FOR FLEXI CONT (MISCELLANEOUS) ×2 IMPLANT
CANISTER SUCTION 2500CC (MISCELLANEOUS) ×2 IMPLANT
CANNULA VESSEL W/WING WO/VALVE (CANNULA) ×2 IMPLANT
CATH ROBINSON RED A/P 18FR (CATHETERS) ×2 IMPLANT
CATH SUCT 10FR WHISTLE TIP (CATHETERS) ×1 IMPLANT
CLIP TI MEDIUM 24 (CLIP) ×2 IMPLANT
CLIP TI WIDE RED SMALL 24 (CLIP) ×2 IMPLANT
CLOTH BEACON ORANGE TIMEOUT ST (SAFETY) ×2 IMPLANT
COVER SURGICAL LIGHT HANDLE (MISCELLANEOUS) ×4 IMPLANT
CRADLE DONUT ADULT HEAD (MISCELLANEOUS) ×2 IMPLANT
DERMABOND ADVANCED (GAUZE/BANDAGES/DRESSINGS) ×1
DERMABOND ADVANCED .7 DNX12 (GAUZE/BANDAGES/DRESSINGS) ×1 IMPLANT
DRAIN CHANNEL 15F RND FF W/TCR (WOUND CARE) IMPLANT
DRAPE WARM FLUID 44X44 (DRAPE) ×2 IMPLANT
ELECT REM PT RETURN 9FT ADLT (ELECTROSURGICAL) ×2
ELECTRODE REM PT RTRN 9FT ADLT (ELECTROSURGICAL) ×1 IMPLANT
EVACUATOR SILICONE 100CC (DRAIN) IMPLANT
GLOVE BIO SURGEON STRL SZ7.5 (GLOVE) ×2 IMPLANT
GLOVE BIOGEL PI IND STRL 6.5 (GLOVE) IMPLANT
GLOVE BIOGEL PI IND STRL 7.0 (GLOVE) IMPLANT
GLOVE BIOGEL PI IND STRL 7.5 (GLOVE) ×1 IMPLANT
GLOVE BIOGEL PI INDICATOR 6.5 (GLOVE) ×1
GLOVE BIOGEL PI INDICATOR 7.0 (GLOVE) ×1
GLOVE BIOGEL PI INDICATOR 7.5 (GLOVE) ×3
GLOVE SURG SS PI 7.0 STRL IVOR (GLOVE) ×2 IMPLANT
GLOVE SURG SS PI 7.5 STRL IVOR (GLOVE) ×2 IMPLANT
GOWN PREVENTION PLUS XLARGE (GOWN DISPOSABLE) ×2 IMPLANT
GOWN STRL NON-REIN LRG LVL3 (GOWN DISPOSABLE) ×5 IMPLANT
GOWN STRL REIN XL XLG (GOWN DISPOSABLE) ×1 IMPLANT
KIT BASIN OR (CUSTOM PROCEDURE TRAY) ×2 IMPLANT
KIT ROOM TURNOVER OR (KITS) ×2 IMPLANT
NDL HYPO 25X1 1.5 SAFETY (NEEDLE) ×1 IMPLANT
NEEDLE HYPO 25X1 1.5 SAFETY (NEEDLE) ×2 IMPLANT
NS IRRIG 1000ML POUR BTL (IV SOLUTION) ×4 IMPLANT
PACK CAROTID (CUSTOM PROCEDURE TRAY) ×2 IMPLANT
PAD ARMBOARD 7.5X6 YLW CONV (MISCELLANEOUS) ×4 IMPLANT
PATCH HEMASHIELD 8X150 (Vascular Products) ×1 IMPLANT
SHUNT CAROTID BYPASS 10 (VASCULAR PRODUCTS) IMPLANT
SHUNT CAROTID BYPASS 12FRX15.5 (VASCULAR PRODUCTS) IMPLANT
SPECIMEN JAR SMALL (MISCELLANEOUS) ×2 IMPLANT
SPONGE SURGIFOAM ABS GEL 100 (HEMOSTASIS) IMPLANT
SUT PROLENE 6 0 BV (SUTURE) ×4 IMPLANT
SUT PROLENE 7 0 BV 1 (SUTURE) IMPLANT
SUT SILK 2 0 FS (SUTURE) IMPLANT
SUT SILK 3 0 (SUTURE) ×4
SUT SILK 3-0 18XBRD TIE 12 (SUTURE) IMPLANT
SUT VIC AB 3-0 SH 27 (SUTURE) ×2
SUT VIC AB 3-0 SH 27X BRD (SUTURE) ×1 IMPLANT
SUT VICRYL 4-0 PS2 18IN ABS (SUTURE) ×2 IMPLANT
SYR CONTROL 10ML LL (SYRINGE) ×2 IMPLANT
TOWEL OR 17X24 6PK STRL BLUE (TOWEL DISPOSABLE) ×2 IMPLANT
TOWEL OR 17X26 10 PK STRL BLUE (TOWEL DISPOSABLE) ×2 IMPLANT
TRAY FOLEY CATH 14FRSI W/METER (CATHETERS) ×1 IMPLANT
WATER STERILE IRR 1000ML POUR (IV SOLUTION) ×2 IMPLANT

## 2011-12-18 NOTE — Transfer of Care (Signed)
Immediate Anesthesia Transfer of Care Note  Patient: John [name]  Procedure(s) Performed: Procedure(s) (LRB): ENDARTERECTOMY CAROTID (Left)  Patient Location: PACU  Anesthesia Type: General  Level of Consciousness: awake, alert  and oriented  Airway & Oxygen Therapy: Patient Spontanous Breathing and Patient connected to face mask oxygen  Post-op Assessment: Report given to PACU RN  Post vital signs: Reviewed and stable  Complications: No apparent anesthesia complications

## 2011-12-18 NOTE — H&P (View-Only) (Signed)
[facility]  Patient name: BLAND RUDZINSKI MRN: [id number] DOB: 09/30/1955 Sex: male  CC: asymptomatic greater than 80% left carotid stenosis.  HPI: BERLE FITZ is a 56 y.o. male who I seen on 11/12/2011 the right great toe wound which had been improving. He underwent a CO2 arteriogram to determine if he was a candidate for revascularization of the wound progresses. In addition I had detected a carotid bruit so he also had a carotid duplex scan today on his follow up visit. He is right-handed. He denies any history of stroke, TIAs, expressive or receptive aphasia, or amaurosis fugax.  With respect to his right great toe he says that this wound has essentially healed at this point and he has stable claudication of the right lower extremity. He has had no fever or chills. He has almost complete is his Cipro which she was on for his toe.  Past Medical History  Diagnosis Date  . COPD (chronic obstructive pulmonary disease)   . Chronic back pain     Chronic narcotics for this for many years  . Diabetes mellitus   . Chronic kidney disease     CKD stage III  . Hyperlipidemia   . Hypertension   . Foot pain   . Stroke     Family History  Problem Relation Age of Onset  . Diabetes Mother   . Heart disease Mother   . Hypertension Mother   . Heart attack Mother   . Diabetes Father   . Heart disease Father   . Hypertension Father   . Heart attack Father   . Other Father     bleeding problems  . Diabetes Brother   . Heart disease Brother     SOCIAL HISTORY: History  Substance Use Topics  . Smoking status: Current Everyday Smoker -- 0.5 packs/day for 40 years    Types: Cigarettes  . Smokeless tobacco: Never Used   Comment: smoking cessation info given and reviewed  . Alcohol Use: No     Previous drinker - However, quit in 2006    Allergies  Allergen Reactions  . Vicodin (Hydrocodone-Acetaminophen)     itching    Current Outpatient Prescriptions    Medication Sig Dispense Refill  . albuterol (PROVENTIL HFA;VENTOLIN HFA) 108 (90 BASE) MCG/ACT inhaler Inhale 2 puffs into the lungs every 6 (six) hours as needed. For shortness of breath.      Marland Kitchen amLODipine (NORVASC) 10 MG tablet Take 10 mg by mouth daily.      Marland Kitchen aspirin 81 MG chewable tablet Chew 81 mg by mouth daily.      . baclofen (LIORESAL) 10 MG tablet Take 10 mg by mouth 2 (two) times daily.       . Calcium-Magnesium-Vitamin D (CITRACAL CALCIUM+D) 600-40-500 MG-MG-UNIT TB24 Take 1 tablet by mouth 2 (two) times daily.  60 tablet  3  . ciprofloxacin (CIPRO) 500 MG tablet Take 500 mg by mouth 2 (two) times daily. Take for 10 days.  First dose 11/12/2011.      Marland Kitchen doxycycline (VIBRA-TABS) 100 MG tablet Take 100 mg by mouth 2 (two) times daily. Take for 10 days.  First dose 11/12/2011.      . furosemide (LASIX) 40 MG tablet Take 40 mg by mouth daily.      . insulin glargine (LANTUS) 100 UNIT/ML injection Inject 30 Units into the skin every morning.      . insulin lispro (HUMALOG) 100 UNIT/ML injection Inject 5-8 Units  into the skin 3 (three) times daily before meals. Per sliding scale.      . nortriptyline (PAMELOR) 10 MG capsule Take 1 capsule (10 mg total) by mouth at bedtime.  30 capsule  3  . oxyCODONE-acetaminophen (PERCOCET) 7.5-325 MG per tablet Take 1 tablet by mouth every 6 (six) hours as needed. For pain.      . simvastatin (ZOCOR) 80 MG tablet Take 1 tablet (80 mg total) by mouth at bedtime.  90 tablet  3  . sodium bicarbonate 650 MG tablet Take 2 tablets (1,300 mg total) by mouth 2 (two) times daily.  120 tablet  6  . sodium polystyrene (KAYEXALATE) 15 GM/60ML suspension Take 15 g by mouth daily.       Marland Kitchen DISCONTD: tiotropium (SPIRIVA HANDIHALER) 18 MCG inhalation capsule Place 1 capsule (18 mcg total) into inhaler and inhale daily.  30 capsule  2    REVIEW OF SYSTEMS: Arly.Keller ] denotes positive finding; [  ] denotes negative finding CARDIOVASCULAR:  [ ]  chest pain   [ ]  chest pressure   [ ]   palpitations   [ ]  orthopnea   [ ]  dyspnea on exertion   Arly.Keller ] claudication   [ ]  rest pain   [ ]  DVT   [ ]  phlebitis PULMONARY:   [ ]  productive cough   [ ]  asthma   [ ]  wheezing NEUROLOGIC:   [ ]  weakness  [ ]  paresthesias  [ ]  aphasia  [ ]  amaurosis  [ ]  dizziness HEMATOLOGIC:   [ ]  bleeding problems   [ ]  clotting disorders MUSCULOSKELETAL:  [ ]  joint pain   [ ]  joint swelling [ ]  leg swelling GASTROINTESTINAL: [ ]   blood in stool  [ ]   hematemesis GENITOURINARY:  [ ]   dysuria  [ ]   hematuria PSYCHIATRIC:  [ ]  history of major depression INTEGUMENTARY:  [ ]  rashes  [ ]  ulcers CONSTITUTIONAL:  [ ]  fever   [ ]  chills  PHYSICAL EXAM: Filed Vitals:   12/03/11 1413 12/03/11 1426  BP: 149/85 134/79  Pulse: 99 98  Temp: 98.4 F (36.9 C)   TempSrc: Oral   Resp:  12  Height: 5\' 9"  (1.753 m)   Weight: 200 lb (90.719 kg)   SpO2: 99%    Body mass index is 29.53 kg/(m^2). GENERAL: The patient is a well-nourished male, in no acute distress. The vital signs are documented above. CARDIOVASCULAR: There is a regular rate and rhythm without significant murmur appreciated. He has bilateral carotid bruits. He has palpable femoral pulses. Both feet are warm and adequately perfused. PULMONARY: There is good air exchange bilaterally without wheezing or rales. ABDOMEN: Soft and non-tender with normal pitched bowel sounds.  MUSCULOSKELETAL: There are no major deformities or cyanosis. NEUROLOGIC: No focal weakness or paresthesias are detected. SKIN: There are no ulcers or rashes noted. The right great toe wound is slightly dark on the medial aspect but there is no open wound or drainage. There is no erythema. PSYCHIATRIC: The patient has a normal affect.  DATA:  I have independently interpreted his carotid duplex scan which shows a greater than 80% left carotid stenosis with no significant stenosis on the right. Both vertebral arteries have antegrade flow. His left arm pressures higher than his right arm  pressure.  I have reviewed his arteriogram as performed by Dr. Fabienne Bruns. This shows an occluded right superficial femoral artery with reconstitution of the above-knee popliteal artery and three-vessel runoff on the right.  MEDICAL ISSUES:  Occlusion and stenosis of carotid artery without mention of cerebral infarction Carotid duplex scan today shows a greater than 80% left carotid stenosis. He is asymptomatic. However, given the severity of stenosis I have recommended left carotid endarterectomy in order to lower his risk of future stroke. I have reviewed the indications for carotid endarterectomy, that is to lower the risk of future stroke. I have also reviewed the potential complications of surgery, including but not limited to: bleeding, stroke (perioperative risk 1-2%), MI, nerve injury of other unpredictable medical problems. All of the patients questions were answered and they are agreeable to proceed with surgery.  The surgery is scheduled for July 11. He knows to continue his aspirin right up through surgery.   Atherosclerosis of native arteries of the extremities with intermittent claudication The patient's right great toe wound has continued to improve. His claudication symptoms in the right are stable. For this reason at this point I would not recommend revascularization unless the toe wound returned. He appears to be a candidate for a fem to above-knee popliteal artery bypass graft on the right if he develops progressive symptoms. Of note vein map of the right shows that the vein is somewhat marginal in size. We'll continue to follow his peripheral vascular disease and I'll plan on seeing him back for this in 2 weeks after his carotid endarterectomy in in 6 months after that. We have again discussed the importance of tobacco cessation.   Aleli Navedo S [facility] [place] Office: [phone]

## 2011-12-18 NOTE — Interval H&P Note (Signed)
History and Physical Interval Note:  12/18/2011 10:22 AM  John [name]  has presented today for surgery, with the diagnosis of LEFT ICA STENOSIS  The various methods of treatment have been discussed with the patient and family. After consideration of risks, benefits and other options for treatment, the patient has consented to: LEFT CAROTID ENDARTERECTOMY.  The patient's history has been reviewed, patient examined, no change in status, stable for surgery.  I have reviewed the patients' chart and labs.  Questions were answered to the patient's satisfaction.     Serenna Deroy S

## 2011-12-18 NOTE — Preoperative (Signed)
Beta Blockers   Reason not to administer Beta Blockers:Not Applicable 

## 2011-12-18 NOTE — Anesthesia Preprocedure Evaluation (Signed)
Anesthesia Evaluation  Patient identified by MRN, date of birth, ID band Patient awake    Reviewed: Allergy & Precautions, H&P , NPO status , Patient's Chart, lab work & pertinent test results  Airway Mallampati: I TM Distance: >3 FB Neck ROM: full    Dental   Pulmonary asthma , COPD         Cardiovascular hypertension, + Peripheral Vascular Disease Rhythm:regular Rate:Normal     Neuro/Psych CVA    GI/Hepatic   Endo/Other  Type 2, Insulin Dependent  Renal/GU      Musculoskeletal   Abdominal   Peds  Hematology   Anesthesia Other Findings   Reproductive/Obstetrics                           Anesthesia Physical Anesthesia Plan  ASA: III  Anesthesia Plan: General   Post-op Pain Management:    Induction: Intravenous  Airway Management Planned: Oral ETT  Additional Equipment: Arterial line  Intra-op Plan:   Post-operative Plan: Extubation in OR  Informed Consent: I have reviewed the patients History and Physical, chart, labs and discussed the procedure including the risks, benefits and alternatives for the proposed anesthesia with the patient or authorized representative who has indicated his/her understanding and acceptance.     Plan Discussed with: CRNA, Anesthesiologist and Surgeon  Anesthesia Plan Comments:         Anesthesia Quick Evaluation

## 2011-12-18 NOTE — Anesthesia Postprocedure Evaluation (Signed)
  Anesthesia Post-op Note  Patient: John [name]  Procedure(s) Performed: Procedure(s) (LRB): ENDARTERECTOMY CAROTID (Left)  Patient Location: PACU  Anesthesia Type: General  Level of Consciousness: awake, oriented, sedated and patient cooperative  Airway and Oxygen Therapy: Patient Spontanous Breathing and Patient connected to nasal cannula oxygen  Post-op Pain: mild  Post-op Assessment: Post-op Vital signs reviewed, Patient's Cardiovascular Status Stable, Respiratory Function Stable, Patent Airway, No signs of Nausea or vomiting and Pain level controlled  Post-op Vital Signs: stable  Complications: No apparent anesthesia complications

## 2011-12-18 NOTE — Progress Notes (Signed)
Potassium and Creatinine levels abnormal.  Dr. Katrinka Blazing, Anesthesiologist, notified. Levels show to be better than last blood drawn.  Will proceed w/surgery.//L. Yeraldin Litzenberger,RN

## 2011-12-18 NOTE — Op Note (Signed)
NAME: John [name]   MRN: [id number] DOB: [birth year]    DATE OF OPERATION: 12/18/2011  PREOP DIAGNOSIS: greater than 80% left carotid stenosis  POSTOP DIAGNOSIS: same  PROCEDURE: extensive left carotid endarterectomy with long Dacron patch angioplasty  SURGEON: Di Kindle. Edilia Bo, MD, FACS  ASSIST: Della Goo  ANESTHESIA: Gen.   EBL: 100 cc  INDICATIONS: John [name] is a 56 y.o. male who was found to have a left carotid bruit. This prompted a duplex scan which showed a critical left carotid stenosis. Left carotid endarterectomy was recommended in order to lower his risk of future stroke.  FINDINGS: the patient had extensive plaque extending into the common carotid artery and high into the internal carotid artery. He had a high bifurcation.  TECHNIQUE: The patient was brought to the operating room and received a general anesthetic. Arterial line had been placed by anesthesia. The left neck was prepped and draped in usual sterile fashion. An incision was made along the anterior border of the sternocleidomastoid and the dissection carried down to the common carotid artery which was controlled with a Rummel tourniquet. I had to extend the incision proximally because the plaque extended very low. The facial vein was divided between 2-0 silk ties. The carotid bifurcation was quite high. With some difficulty I fully mobilized the hypoglossal nerve and was able to clamp above the plaque. This required division of the occipital artery between 2-0 silk ties. The external carotid artery which was too large branches was controlled in addition to a pharyngeal branch and the superior thyroid artery. The patient was then heparinized. Clamps were then placed on the internal then the common carotid artery and all the branches of the external carotid artery were controlled. A longitudinal arteriotomy was made in the common carotid artery. This was extended proximally and then distally.the plaque extended  very high into the internal carotid artery. I initially placed a shunt but had to remove this because of poor visualization distally with the shunt in place. The patient had excellent backbleeding. An endarterectomy plane was established proximally and the plaque was sharply divided. Eversion endarterectomy was performed of the external carotid artery. Distally there was a nice taper the plaque in the internal carotid artery no tacking sutures were required. A long Dacron patch was then sewn using 2 continuous 6-0 Prolene sutures. Prior to completing the anastomosis the arteries were backbled and flushed appropriately. The anastomosis was completed. Flow was reestablished first to the external carotid artery and into the internal carotid artery. At completion there was an excellent Doppler signal in the internal carotid artery and a good pulse. The heparin was partially reversed with protamine. The wound was closed with deep layer 3-0 Vicryl. The platysma was closed with running 3-0 Vicryl. The skin was closed with 4-0 subcuticular stitch. Dermabond was applied. The patient awoke neurologically intact. All needle and sponge counts were correct. The patient tolerated the procedure well and was transferred to the recovery room in stable condition.  Waverly Ferrari, MD, FACS [facility]  DATE OF DICTATION:   12/18/2011

## 2011-12-19 ENCOUNTER — Encounter (HOSPITAL_COMMUNITY): Payer: Self-pay | Admitting: Vascular Surgery

## 2011-12-19 ENCOUNTER — Telehealth: Payer: Self-pay | Admitting: Vascular Surgery

## 2011-12-19 LAB — GLUCOSE, CAPILLARY
Glucose-Capillary: 156 mg/dL — ABNORMAL HIGH (ref 70–99)
Glucose-Capillary: 109 mg/dL — ABNORMAL HIGH (ref 70–99)

## 2011-12-19 LAB — BASIC METABOLIC PANEL
BUN: 45 mg/dL — ABNORMAL HIGH (ref 6–23)
CO2: 19 mEq/L (ref 19–32)
Calcium: 7.4 mg/dL — ABNORMAL LOW (ref 8.4–10.5)
GFR calc Af Amer: 24 mL/min — ABNORMAL LOW (ref >90)
GFR calc non Af Amer: 21 mL/min — ABNORMAL LOW (ref >90)
Glucose, Bld: 85 mg/dL (ref 70–99)
Glucose, Bld: 122 mg/dL — ABNORMAL HIGH (ref 70–99)
Potassium: 7.2 mEq/L (ref 3.5–5.1)
Potassium: 5.8 mEq/L — ABNORMAL HIGH (ref 3.5–5.1)
Sodium: 146 mEq/L — ABNORMAL HIGH (ref 135–145)
Sodium: 144 mEq/L (ref 135–145)

## 2011-12-19 LAB — CBC
Hemoglobin: 10.9 g/dL — ABNORMAL LOW (ref 13.0–17.0)
RBC: 3.86 MIL/uL — ABNORMAL LOW (ref 4.22–5.81)

## 2011-12-19 LAB — HEMOGLOBIN A1C: Mean Plasma Glucose: 171 mg/dL — ABNORMAL HIGH (ref <117)

## 2011-12-19 MED ORDER — TAMSULOSIN HCL 0.4 MG PO CAPS
0.4000 mg | ORAL_CAPSULE | Freq: Every day | ORAL | Status: DC
Start: 2011-12-19 — End: 2011-12-21
  Administered 2011-12-19 – 2011-12-21 (×3): 0.4 mg via ORAL
  Filled 2011-12-19 (×3): qty 1

## 2011-12-19 NOTE — Progress Notes (Signed)
Pt currently with no void since surgery, no foley was placed during surgery, pt bladder scanned noted to have 582 in bladder, MD/N, MD encouraged straight cath however pt currently refuses, MD with no new orders encourages pt to cont to try and void and to agree to straight cath at later time, RN will cont to monitor and encourage pt to void or I&O cath if needed

## 2011-12-19 NOTE — Progress Notes (Signed)
Critical Lab Value: K 7.2 MD on call notified. Orders given to repeat BMET.   Maximino Greenland RN

## 2011-12-19 NOTE — Progress Notes (Signed)
Pt voided 800

## 2011-12-19 NOTE — Progress Notes (Signed)
Notified Eula Flax PA that pt still has not voided--no new orders at this time. Renette Butters, Viona Gilmore

## 2011-12-19 NOTE — Progress Notes (Addendum)
VASCULAR AND VEIN SURGERY POST - OP CEA PROGRESS NOTE  Date of Surgery: 12/18/2011 Surgeon: Surgeon(s): Chuck Hint, MD 1 Day Post-Op left Carotid Endarterectomy .  HPI: John [name] is a 56 y.o. male who is 1 Day Post-Op left Carotid Endarterectomy . Patient is doing well. Patient denies headache; Patient denies difficulty swallowing; denies weakness in upper or lower extremities; Pt. denies other symptoms of stroke or TIA.  IMAGING: No results found.  Significant Diagnostic Studies: CBC Lab Results  Component Value Date   WBC 15.4* 12/19/2011   HGB 10.9* 12/19/2011   HCT 35.2* 12/19/2011   MCV 91.2 12/19/2011   PLT 134* 12/19/2011    BMET    Component Value Date/Time   NA 146* 12/19/2011 0500   K 7.2* 12/19/2011 0500   CL 116* 12/19/2011 0500   CO2 19 12/19/2011 0500   GLUCOSE 85 12/19/2011 0500   BUN 44* 12/19/2011 0500   CREATININE 3.01* 12/19/2011 0500   CREATININE 2.75* 11/25/2011 1548   CALCIUM 6.9* 12/19/2011 0500   CALCIUM 9.0 01/21/2008 0000   GFRNONAA 22* 12/19/2011 0500   GFRAA 25* 12/19/2011 0500    COAG Lab Results  Component Value Date   INR 0.98 12/10/2011   No results found for this basename: PTT      Intake/Output Summary (Last 24 hours) at 12/19/11 0724 Last data filed at 12/19/11 0600  Gross per 24 hour  Intake 3663.33 ml  Output   1200 ml  Net 2463.33 ml    Physical Exam:  BP Readings from Last 3 Encounters:  12/19/11 141/69  12/19/11 141/69  12/15/11 130/74   Temp Readings from Last 3 Encounters:  12/19/11 98.4 F (36.9 C) Oral  12/19/11 98.4 F (36.9 C) Oral  12/10/11 98.8 F (37.1 C)    SpO2 Readings from Last 3 Encounters:  12/19/11 100%  12/19/11 100%  12/10/11 97%   Pulse Readings from Last 3 Encounters:  12/19/11 106  12/19/11 106  12/15/11 84    Pt is A&O x 3 Left exophthalmos stable Gait is normal Speech is fluent left Neck Wound is healing well/soft Patient with Negative tongue deviation and Negative  facial droop Pt has good and equal strength in all extremities  Assessment: John [name] is a 56 y.o. male is S/P Left Carotid endarterectomy Pt is voiding, ambulating and taking po well Neuro exam intact Hyperkalemia - takes kayexalate at home, repeating K+ this am CR stable at 3 with GFR 22   Plan: Discharge to: Home after voids and takes PO well/ and if K+ stable Follow-up in 2 weeks   ROCZNIAK,REGINA J [phone] 12/19/2011 7:24 AM   Doing well. Agree with plans for D/C today.  Di Kindle. Edilia Bo, MD, FACS Beeper [phone] 12/19/2011

## 2011-12-19 NOTE — Progress Notes (Signed)
UR Completed. Shawntell Dixson, RN, Nurse Case Manager [phone]     

## 2011-12-19 NOTE — Discharge Summary (Signed)
[facility] Discharge Summary   Patient ID:  John [name] MRN: [id number] DOB/AGE: 1955/09/15 56 y.o.  Admit date: 12/18/2011 Discharge date: 12/21/11 Date of Surgery: 12/18/2011 Surgeon: Surgeon(s): Chuck Hint, MD  Admission Diagnosis: LEFT ICA STENOSIS  Discharge Diagnoses:  LEFT ICA STENOSIS  Secondary Diagnoses: Past Medical History  Diagnosis Date  . COPD (chronic obstructive pulmonary disease)   . Chronic back pain     Chronic narcotics for this for many years  . Diabetes mellitus   . Chronic kidney disease     CKD stage III  . Hyperlipidemia   . Hypertension   . Foot pain   . Peripheral vascular disease   . Asthma   . Stroke     No residual "Mini strokes"  . Ulcer 1985  . Fracture of foot     Compond- Right    Procedure(s): ENDARTERECTOMY CAROTID  Discharged Condition: good  HPI:  John [name] is a 56 y.o. male who I seen on 11/12/2011 the right great toe wound which had been improving. He underwent a CO2 arteriogram to determine if he was a candidate for revascularization of the wound progresses. In addition Dr Edilia Bo had detected a carotid bruit so he also had a carotid duplex scan today on his follow up visit. He is right-handed. He denies any history of stroke, TIAs, expressive or receptive aphasia, or amaurosis fugax. Carotid duplex scan today shows a greater than 80% left carotid stenosis. He is asymptomatic. However, given the severity of stenosis it was recommended  He under go an elective left carotid endarterectomy in order to lower his risk of future stroke.  Hospital Course:  John [name] is a 56 y.o. male is S/P Left Procedure(s): ENDARTERECTOMY CAROTID Extubated: POD # 0 Post-op wounds healing well Neuro exam intact and stable Hyper kalemia secondary to CRI - CR stable Pt takes kayexalate daily Pt. Ambulating, voiding and taking PO diet without difficulty. Pt pain controlled with PO pain meds. Labs as  below Complications:  Pt did have trouble voiding post operatively.  He was started on Flomax.  He has voided since then without difficulty.   On POD 2, pt complained of some difficulty swallowing. Speech path was consulted. Recommendations were made and on POD 3, his symptoms had improved. Plan for D/C today if ok with Speech Pathology.   Consults:   Speech Pathology for swallowing evaluation  Significant Diagnostic Studies: CBC    Component Value Date/Time   WBC 15.4* 12/19/2011 0500   RBC 3.86* 12/19/2011 0500   HGB 10.9* 12/19/2011 0500   HCT 35.2* 12/19/2011 0500   PLT 134* 12/19/2011 0500   MCV 91.2 12/19/2011 0500   MCH 28.2 12/19/2011 0500   MCHC 31.0 12/19/2011 0500   RDW 15.4 12/19/2011 0500        Component Value Date/Time   NA 144 12/19/2011 0728   K 5.8* 12/19/2011 0728   CL 113* 12/19/2011 0728   CO2 19 12/19/2011 0728   GLUCOSE 122* 12/19/2011 0728   BUN 45* 12/19/2011 0728   CREATININE 3.12* 12/19/2011 0728   CREATININE 2.75* 11/25/2011 1548   CALCIUM 7.4* 12/19/2011 0728   CALCIUM 9.0 01/21/2008 0000   GFRNONAA 21* 12/19/2011 0728   GFRAA 24* 12/19/2011 0728      COAG Lab Results  Component Value Date   INR 0.98 12/10/2011     Disposition:  Discharge to :Home Discharge Orders    Future Orders Please Complete By Expires  Resume previous diet      Driving Restrictions      Comments:   No driving for 2 weeks   Lifting restrictions      Comments:   No lifting for 4 weeks   Call MD for:  temperature >100.5      Call MD for:  redness, tenderness, or signs of infection (pain, swelling, bleeding, redness, odor or green/yellow discharge around incision site)      Call MD for:  severe or increased pain, loss or decreased feeling  in affected limb(s)      Increase activity slowly      Comments:   Walk with assistance use walker or cane as needed   May shower       Scheduling Instructions:   Saturday   No dressing needed      may wash over wound with mild soap and  water      CAROTID Sugery: Call MD for difficulty swallowing or speaking; weakness in arms or legs that is a new symtom; severe headache.  If you have increased swelling in the neck and/or  are having difficulty breathing, CALL 911         [place], [place]  Home Medication Instructions ZOX:[id number]   Printed on:12/20/11 8119  Medication Information                    baclofen (LIORESAL) 10 MG tablet Take 10 mg by mouth 2 (two) times daily.            furosemide (LASIX) 40 MG tablet Take 40 mg by mouth daily.           sodium polystyrene (KAYEXALATE) 15 GM/60ML suspension Take 15 g by mouth daily.            aspirin 81 MG chewable tablet Chew 81 mg by mouth daily.           insulin lispro (HUMALOG) 100 UNIT/ML injection Inject 5 Units into the skin 2 (two) times daily before lunch and supper. Per sliding scale.           amLODipine (NORVASC) 10 MG tablet Take 10 mg by mouth daily.           albuterol (PROVENTIL HFA;VENTOLIN HFA) 108 (90 BASE) MCG/ACT inhaler Inhale 2 puffs into the lungs every 6 (six) hours as needed. For shortness of breath.           insulin glargine (LANTUS) 100 UNIT/ML injection Inject 30 Units into the skin every morning.           simvastatin (ZOCOR) 80 MG tablet Take 80 mg by mouth at bedtime.           sodium bicarbonate 650 MG tablet Take 1,300 mg by mouth 2 (two) times daily.           nortriptyline (PAMELOR) 10 MG capsule Take 10 mg by mouth at bedtime.           Calcium Citrate-Vitamin D (CITRACAL + D PO) Take 1 tablet by mouth 2 (two) times daily.           metoprolol succinate (TOPROL-XL) 25 MG 24 hr tablet Take 1 tablet (25 mg total) by mouth daily.           oxyCODONE-acetaminophen (PERCOCET) 7.5-325 MG per tablet Take 1 tablet by mouth every 6 (six) hours as needed for pain. For pain.           Tamsulosin HCl (FLOMAX) 0.4  MG CAPS Take 1 capsule (0.4 mg total) by mouth daily. REFILLS PER PCP            Verbal and written Discharge  instructions given to the patient. Wound care per Discharge AVS Follow-up Information    Follow up with DICKSON,CHRISTOPHER S, MD in 2 weeks. (office will arrange - sent)    Contact information:   [address] [place] [address] [phone]          Signed: [name] 12/19/2011, 7:27 AM  Di Kindle. Edilia Bo, MD, FACS Beeper [phone] 12/21/2011

## 2011-12-19 NOTE — Telephone Encounter (Signed)
Message copied by Fredrich Birks on Fri Dec 19, 2011 10:24 AM ------      Message from: Phillips Odor      Created: Thu Dec 18, 2011  4:18 PM      Regarding: FW: charges and follow up                   ----- Message -----         From: Chuck Hint, MD         Sent: 12/18/2011   2:21 PM           To: Reuel Derby, Conley Simmonds Pullins, RN      Subject: charges and follow up                                    PROCEDURE: extensive left carotid endarterectomy with long Dacron patch angioplasty            SURGEON: Di Kindle. Edilia Bo, MD, FACS            ASSIST: Della Goo            He will need a follow up visit in 2 weeks.            [facility]

## 2011-12-19 NOTE — Telephone Encounter (Signed)
Spoke with patient to notify of appt with [facility] on 01/07/12 @ 945am, also sent letter, dpm

## 2011-12-20 LAB — GLUCOSE, CAPILLARY
Glucose-Capillary: 123 mg/dL — ABNORMAL HIGH (ref 70–99)
Glucose-Capillary: 113 mg/dL — ABNORMAL HIGH (ref 70–99)
Glucose-Capillary: 135 mg/dL — ABNORMAL HIGH (ref 70–99)

## 2011-12-20 MED ORDER — TAMSULOSIN HCL 0.4 MG PO CAPS: 0.4000 mg | ORAL_CAPSULE | Freq: Every day | ORAL | Status: DC

## 2011-12-20 MED ORDER — OXYCODONE-ACETAMINOPHEN 5-325 MG PO TABS
1.0000 | ORAL_TABLET | Freq: Four times a day (QID) | ORAL | Status: DC | PRN
  Administered 2011-12-20: 1 via ORAL
  Filled 2011-12-20: qty 1

## 2011-12-20 NOTE — Progress Notes (Addendum)
[facility] Progress Note  12/20/2011 8:08 AM POD 2  Subjective:  States he has voided a couple of times.  RN note from last pm-pt voided 800cc at 7pm. Denies any trouble swallowing-states he has had some numbness in his left jaw but that has gotten better.  Tm 99.4 this am  VSS   95%RA Filed Vitals:   12/20/11 0400  BP: 160/69  Pulse: 103  Temp: 99.4 F (37.4 C)  Resp: 14     Physical Exam: Neuro:  In tact. Incision:  Has some fullness proximally.  CBC    Component Value Date/Time   WBC 15.4* 12/19/2011 0500   RBC 3.86* 12/19/2011 0500   HGB 10.9* 12/19/2011 0500   HCT 35.2* 12/19/2011 0500   PLT 134* 12/19/2011 0500   MCV 91.2 12/19/2011 0500   MCH 28.2 12/19/2011 0500   MCHC 31.0 12/19/2011 0500   RDW 15.4 12/19/2011 0500    BMET    Component Value Date/Time   NA 144 12/19/2011 0728   K 5.8* 12/19/2011 0728   CL 113* 12/19/2011 0728   CO2 19 12/19/2011 0728   GLUCOSE 122* 12/19/2011 0728   BUN 45* 12/19/2011 0728   CREATININE 3.12* 12/19/2011 0728   CREATININE 2.75* 11/25/2011 1548   CALCIUM 7.4* 12/19/2011 0728   CALCIUM 9.0 01/21/2008 0000   GFRNONAA 21* 12/19/2011 0728   GFRAA 24* 12/19/2011 0728     Intake/Output Summary (Last 24 hours) at 12/20/11 0808 Last data filed at 12/20/11 0300  Gross per 24 hour  Intake    840 ml  Output   1500 ml  Net   -660 ml      Assessment/Plan:  This is a 56 y.o. male who is s/p left CEA POD 2  -pt now voiding.  -pt did have hyperkalemia yesterday-takes daily kayexalate. -creatinine stable yesterday at 3.12 -discharge after Dr. Edilia Bo sees pt. -f/u with Dr. Edilia Bo in 2 weeks.  Doreatha Massed, PA-C [facility] [phone]  This morning, patient complains of some difficulty swallowing. Incision looks fine. Minimal swelling. Neuro intact Will hold D/C and get Speech path to evaluate swallowing.  Di Kindle. Edilia Bo, MD, FACS Beeper [phone] 12/20/2011

## 2011-12-20 NOTE — Evaluation (Signed)
Clinical/Bedside Swallow Evaluation Patient Details  Name: John [name] MRN: [id number] Date of Birth: 1955-10-22  Today's Date: 12/20/2011 Time: 1230-1315 SLP Time Calculation (min): 45 min  Past Medical History:  Past Medical History  Diagnosis Date  . COPD (chronic obstructive pulmonary disease)   . Chronic back pain     Chronic narcotics for this for many years  . Diabetes mellitus   . Chronic kidney disease     CKD stage III  . Hyperlipidemia   . Hypertension   . Foot pain   . Peripheral vascular disease   . Asthma   . Stroke     No residual "Mini strokes"  . Ulcer 1985  . Fracture of foot     Compond- Right   Past Surgical History:  Past Surgical History  Procedure Date  . Neck surgery   . Eye surgery   . Cardiac catheterization   . Endarterectomy 12/18/2011    Procedure: ENDARTERECTOMY CAROTID;  Surgeon: Chuck Hint, MD;  Location: [facility] OR;  Service: Vascular;  Laterality: Left;   HPI:  John [name] is a 56 y.o. male who was found to have a left carotid bruit. This prompted a duplex scan which showed a critical left carotid stenosis. Left carotid endarterectomy was recommended in order to lower his risk of future stroke.  Patient referred for BSE secondary to reports of food "sticking" s/p left carotid endarterectomy.    Assessment / Plan / Recommendation Clinical Impression  Min to Moderate suspected structural dysphagia.  Patient reports "food sticking" on left side . No outward s/s of penetration/aspiration noted with PO trials of thin liquid and solids even when challenged with mulitiple trials.  No change in O2 /RR during po trials.  Strategies of effortful swallow and following bites with sips effective in decreasing sensation of residuals.  Strategy of head turn not effective.  Recommend Skilled ST in acute care setting for diet tolerance and to provide education to patient and caregivers on strategies recommended to decrease risk of aspiration.   Objective assessment of MBS to be determined.      Aspiration Risk  Mild    Diet Recommendation Regular /thin liquids  Liquid Administration via: Cup;Straw Medication Administration: Whole meds with liquid Supervision: Intermittent supervision to cue for compensatory strategies Compensations: Slow rate;Small sips/bites;Multiple dry swallows after each bite/sip;Follow solids with liquid Postural Changes and/or Swallow Maneuvers: Out of bed for meals;Seated upright 90 degrees;Upright 30-60 min after meal    Other  Recommendations Oral Care Recommendations: Oral care BID      Frequency and Duration min 2x/week  2 weeks       SLP Swallow Goals Patient will consume recommended diet without observed clinical signs of aspiration with: Modified independent assistance Patient will utilize recommended strategies during swallow to increase swallowing safety with: Modified independent assistance   Swallow Study Prior Functional Status   No prior reports of dysphagia. Tolerated regular consistency and thin liquids with no difficulty.     General Date of Onset: 12/20/11 HPI: John [name] is a 56 y.o. male who was found to have a left carotid bruit. This prompted a duplex scan which showed a critical left carotid stenosis. Left carotid endarterectomy was recommended in order to lower his risk of future stroke. Type of Study: Bedside swallow evaluation Previous Swallow Assessment: Patient denied prior BSE Diet Prior to this Study: Regular;Thin liquids Temperature Spikes Noted: No Respiratory Status: Room air History of Recent Intubation: No Behavior/Cognition: Alert;Cooperative;Pleasant mood Oral  Cavity - Dentition: Poor condition;Missing dentition Self-Feeding Abilities: Able to feed self Patient Positioning: Upright in bed Baseline Vocal Quality: Clear;Hoarse Volitional Cough: Strong Volitional Swallow: Able to elicit    Oral/Motor/Sensory Function Overall Oral Motor/Sensory Function:  Appears within functional limits for tasks assessed   Ice Chips Ice chips: Not tested   Thin Liquid Thin Liquid: Within functional limits Presentation: Cup;Straw    Nectar Thick Nectar Thick Liquid: Not tested   Honey Thick Honey Thick Liquid: Not tested   Puree Puree: Within functional limits Presentation: Self Fed   Solid Solid: Impaired Presentation: Self Fed Pharyngeal Phase Impairments: Multiple swallows   Moreen Fowler MS, CCC-SLP [phone] [facility] 12/20/2011,1:49 PM

## 2011-12-21 LAB — GLUCOSE, CAPILLARY

## 2011-12-21 NOTE — Progress Notes (Signed)
VASCULAR PROGRESS NOTE  SUBJECTIVE: Patient states that his swallowing has improved.  PHYSICAL EXAM: Filed Vitals:   12/20/11 1529 12/20/11 2021 12/20/11 2300 12/21/11 0300  BP:  152/67 154/67 149/73  Pulse:  97 97 102  Temp: 99.1 F (37.3 C) 98.4 F (36.9 C) 98.6 F (37 C) 98.9 F (37.2 C)  TempSrc: Oral Oral Oral Oral  Resp:  11 11 16   Height:      Weight:      SpO2:  97% 95% 98%   Neck incision looks fine. Neuro intact.   LABS: Lab Results  Component Value Date   WBC 15.4* 12/19/2011   HGB 10.9* 12/19/2011   HCT 35.2* 12/19/2011   MCV 91.2 12/19/2011   PLT 134* 12/19/2011   Lab Results  Component Value Date   CREATININE 3.12* 12/19/2011   Lab Results  Component Value Date   INR 0.98 12/10/2011   CBG (last 3)   Basename 12/20/11 2218 12/20/11 1644 12/20/11 1151  GLUCAP 152* 135* 113*     ASSESSMENT/PLAN: 1. 3 Days Post-Op s/p: Left CEA 2. Appreciate Speech Path's help. Seems to be swallowing better.  3. Home today if ok with Speech Path  Waverly Ferrari, MD, FACS Beeper: [phone] 12/21/2011

## 2011-12-21 NOTE — Plan of Care (Signed)
Problem: Phase II Discharge Progression Outcomes Goal: Wound healing without s/s of infection Outcome: Completed/Met Date Met:  12/21/11 Neck incision with dermabond, slightly red and raised. Md aware.

## 2011-12-21 NOTE — Progress Notes (Signed)
Speech Language Pathology Dysphagia Treatment Patient Details Name: John [name] MRN: [id number] DOB: 04-20-1956 Today's Date: 12/21/2011 Time: 1000-1020 SLP Time Calculation (min): 20 min  Assessment / Plan / Recommendation Clinical Impression  Purpose of diagnostic treatment to provide education to patient and caregivers on swallow strategies to decrease risk of penetration and aspiration after the swallow following inital BSE on 12/20/11.  Patient independent in restating strategies to increase bolus transit: small bites, small sips, follow bites with sips, utilize effortful swallow,  upright position with all meals and for  at least 30 minutes following .  RN reports no difficulties taking medication and no outward s/s of aspiration with am and noon meals.  RN reports no changes in lung sounds.  Patient reports improved swallow with no sensation of food "sticking" this am.  Patient to be discharged home. No further Speech Language Pathology warranted at this time.     Diet Recommendation  Continue with Current Diet: Regular Initiate / Change Diet: Thin liquid    SLP Plan All goals met      Swallowing Goals  SLP Swallowing Goals Swallow Study Goal #1 - Progress: Met Swallow Study Goal #2 - Progress: Met  General Temperature Spikes Noted: No Respiratory Status: Room air Behavior/Cognition: Alert;Cooperative;Pleasant mood Oral Cavity - Dentition: Missing dentition;Poor condition Patient Positioning: Upright in bed  Oral Cavity - Oral Hygiene Patient is AT RISK - Oral Care Protocol followed (see row info): Yes   Dysphagia Treatment Treatment focused on: Patient/family/caregiver education Treatment Methods/Modalities: Effortful swallow Patient observed directly with PO's: No Feeding: Able to feed self      Moreen Fowler MS, CCC-SLP [phone] [facility] 12/21/2011, 10:24 AM

## 2011-12-21 NOTE — Progress Notes (Signed)
Patient being discharged home per MD order. All discharge instructions given to both patient and family. They voiced understanding.

## 2012-01-06 ENCOUNTER — Encounter: Payer: Self-pay | Admitting: Vascular Surgery

## 2012-01-07 ENCOUNTER — Ambulatory Visit (INDEPENDENT_AMBULATORY_CARE_PROVIDER_SITE_OTHER): Payer: Medicare Other | Admitting: Vascular Surgery

## 2012-01-07 ENCOUNTER — Encounter: Payer: Self-pay | Admitting: Vascular Surgery

## 2012-01-07 VITALS — BP 145/71 | HR 93 | Temp 98.6°F | Resp 12 | Ht 71.0 in | Wt 198.0 lb

## 2012-01-07 DIAGNOSIS — I70219 Atherosclerosis of native arteries of extremities with intermittent claudication, unspecified extremity: Secondary | ICD-10-CM

## 2012-01-07 DIAGNOSIS — Z48812 Encounter for surgical aftercare following surgery on the circulatory system: Secondary | ICD-10-CM

## 2012-01-07 DIAGNOSIS — I6529 Occlusion and stenosis of unspecified carotid artery: Secondary | ICD-10-CM

## 2012-01-07 NOTE — Progress Notes (Signed)
[facility] [place]  Patient name: John [name] MRN: [id number] DOB: September 04, 1955 Sex: male  REASON FOR VISIT: follow up after left carotid endarterectomy  HPI: John [name] is a 56 y.o. male who was found to have a left carotid bruit. This prompted a duplex scan which showed a critical left carotid stenosis. Left carotid endarterectomy was recommended in order to lower his risk of stroke. He underwent left carotid endarterectomy on 12/18/2011. It was a very extensive plaque which extended very high also very low in the common carotid artery. Patient had a high bifurcation also. However, he did well and was discharged on postoperative day #1. He returns for his first outpatient visit. He's had no focal weakness or paresthesias. He's had no hoarseness or problems swallowing. He does have stable claudication of both lower extremities but no rest pain or nonhealing ulcers currently.   REVIEW OF SYSTEMS: Arly.Keller ] denotes positive finding; [  ] denotes negative finding  CARDIOVASCULAR:  [ ]  chest pain   [ ]  dyspnea on exertion    CONSTITUTIONAL:  [ ]  fever   [ ]  chills  PHYSICAL EXAM: Filed Vitals:   01/07/12 1004 01/07/12 1010  BP: 153/65 145/71  Pulse: 98 93  Temp: 98.6 F (37 C)   TempSrc: Oral   Resp:  12  Height: 5\' 11"  (1.803 m)   Weight: 198 lb (89.812 kg)   SpO2: 99%    Body mass index is 27.62 kg/(m^2). GENERAL: The patient is a well-nourished male, in no acute distress. The vital signs are documented above. CARDIOVASCULAR: There is a regular rate and rhythm  PULMONARY: There is good air exchange bilaterally without wheezing or rales. His left neck incision is healing nicely. He has no focal weakness or paresthesias.  MEDICAL ISSUES: This patient is doing well status post left carotid endarterectomy. I have ordered a follow carotid duplex in 6 months and I'll see him back at that time. He knows to call sooner if he has problems. In the meantime he knows to  continue taking his aspirin. In addition we have discussed the importance of tobacco cessation. He does have known peripheral vascular disease and I will also check ABIs when he comes back in 6 months.  John [name] S [facility] [place] Beeper: [phone]

## 2012-01-07 NOTE — Addendum Note (Signed)
Addended by: Sharee Pimple on: 01/07/2012 12:41 PM   Modules accepted: Orders

## 2012-02-13 ENCOUNTER — Telehealth: Payer: Self-pay | Admitting: Family Medicine

## 2012-02-13 ENCOUNTER — Ambulatory Visit (INDEPENDENT_AMBULATORY_CARE_PROVIDER_SITE_OTHER): Payer: Medicare Other | Admitting: Family Medicine

## 2012-02-13 ENCOUNTER — Encounter: Payer: Self-pay | Admitting: Family Medicine

## 2012-02-13 VITALS — BP 137/78 | HR 98 | Temp 98.2°F | Ht 71.0 in | Wt 199.0 lb

## 2012-02-13 DIAGNOSIS — I6529 Occlusion and stenosis of unspecified carotid artery: Secondary | ICD-10-CM

## 2012-02-13 DIAGNOSIS — E1165 Type 2 diabetes mellitus with hyperglycemia: Secondary | ICD-10-CM

## 2012-02-13 DIAGNOSIS — E118 Type 2 diabetes mellitus with unspecified complications: Secondary | ICD-10-CM

## 2012-02-13 DIAGNOSIS — N189 Chronic kidney disease, unspecified: Secondary | ICD-10-CM

## 2012-02-13 DIAGNOSIS — M549 Dorsalgia, unspecified: Secondary | ICD-10-CM

## 2012-02-13 DIAGNOSIS — F17200 Nicotine dependence, unspecified, uncomplicated: Secondary | ICD-10-CM

## 2012-02-13 LAB — BASIC METABOLIC PANEL
CO2: 18 mEq/L — ABNORMAL LOW (ref 19–32)
Chloride: 118 mEq/L — ABNORMAL HIGH (ref 96–112)
Potassium: 6.2 mEq/L — ABNORMAL HIGH (ref 3.5–5.3)

## 2012-02-13 MED ORDER — OXYCODONE-ACETAMINOPHEN 7.5-325 MG PO TABS: 1.0000 | ORAL_TABLET | Freq: Four times a day (QID) | ORAL | Status: DC | PRN

## 2012-02-13 NOTE — Telephone Encounter (Signed)
Pt is asking to speak with nurse about his percocet's - he is upset that he didn't get more than 30 per month.  He tried to tell Dr Aviva Signs about this, but she didn't listen.

## 2012-02-13 NOTE — Telephone Encounter (Signed)
Was seen in office today by Dr. Aviva Signs.  Patient normally takes Percocet 1 tablet QID.  Normally gets #120 tablets a month and was only given #30 tablets today.  Patient did take Rx to be filled because he was out of med and needed med for pain.  Will route note to Dr. Aviva Signs and call patient back.   Gaylene Brooks, RN

## 2012-02-13 NOTE — Telephone Encounter (Signed)
Pt in clinic today. I explained to pt reason why 30 pills were prescribed. He did not voiced opposition during visit. He saw paper prescription with amount written and accepted it with no objection. He was pt of pain clinic whos' contract was terminated due to absence of opioids in urine, per pt's report. His prior prescription was 30 tab, I refilled what was previously  given. Pt is under pain contract. This was first time I had seen this pt. With more time I will look into database and obtain corresponding UDS to monitor closely. If pt needs more pain medication he needs to come to clinic and without previous notification that he will be tested he needs UDS at that time.

## 2012-02-13 NOTE — Progress Notes (Signed)
  Subjective:    Patient ID: John [name], male    DOB: May 24, 1956, 57 y.o.   MRN: [id number]  HPI This is my first time seen John [name] he is a 56 y/o M with very long and complex medical history that comes today for meds refills and for his pain management. 1.Pain management: Pt states that he has chronic back pain. He was treated at the level of pain clinic but his was dismissed from the practice about 2 years ago. Pt reports that the problem was a negative urine test for opioids.  He has been followed and pain managed in our clinic since then (about 2 years.) Reports take 1 tab of oxycodone 7.5/500 every 6 h PRN. This helps him to be functional. He denies recent need for dose increase or secondary effects ( no constipation, sedation) There is no documented UDS done in or clinic.   2. DM: On insulin regimen. Takes 30 units of Lantus daily and 2-3 units of Novolog before meals (3 times a day). No episodes of hypoglycemia reported. He states that his blood sugars per his meter has been normal.   3. CKD: Followed by Dr. Lacy Duverney. He would like to have his basic kidney labs done hear at this visit, since the last time was on July around his surgery.  4. Recent vascular surgery for carotid endarterectomy. On the left. Well healed. No complications reported. Will have f/u in 6 month from surgery with dopplers and ABI. Recommended to take ASA and smoking cessation. Pt is not ready to quit smoking at this time.   Review of Systems  Constitutional: Negative for activity change and appetite change.  HENT: Negative for neck pain.   Respiratory: Negative for cough, chest tightness and shortness of breath.   Cardiovascular: Negative for chest pain, palpitations and leg swelling.  Gastrointestinal: Negative for nausea, diarrhea and constipation.  Genitourinary: Negative for dysuria, urgency and frequency.  Musculoskeletal: Positive for myalgias, back pain and arthralgias.  Neurological: Negative for  dizziness, weakness, light-headedness, numbness and headaches.       Objective:   Physical Exam  Constitutional: He is oriented to person, place, and time. He appears distressed.       Upset and demanding his opioid prescriptions.  Eyes: Conjunctivae normal are normal.  Neck: Neck supple. No JVD present.       Healed left surgical scar.  Cardiovascular: Normal rate, regular rhythm and normal heart sounds.   No murmur heard. Pulmonary/Chest: Effort normal and breath sounds normal.  Abdominal: Bowel sounds are normal.  Musculoskeletal: He exhibits no edema.  Lymphadenopathy:    He has no cervical adenopathy.  Neurological: He is alert and oriented to person, place, and time.          Assessment & Plan:  Please obtain UDS with opioid panel next visit.

## 2012-02-16 ENCOUNTER — Telehealth: Payer: Self-pay | Admitting: Family Medicine

## 2012-02-16 NOTE — Telephone Encounter (Signed)
Called pt and informed about labs results. He is asymptomatic from chest pain, palpitations. He is urinating with no change in frequency or volume. No swelling, SOB or other symptoms. His only complain is  about that he des not have enough of his pain medication to last for the month. Advise pt to call and schedule an appointment with nephrology ASAP. Also recommended that when he is about to run out of his pain medications he needs to come to the office to get evaluated. Pt was extremely rude with me over the phone. I will discuss this situation with our clinic director.

## 2012-02-18 NOTE — Telephone Encounter (Signed)
Returned call to patient and informed that he needs to call our office at least 24-48 hours if he needs refill of pain med.  Patient states he was called by someone yesterday and told that he may pick up Rx of Friday (02/20/12) at 1:30pm.  Informed patient that he will be seeing Dr. Aviva Signs for an office visit to discuss his pain medication management.  Patient verbalized understanding.   Gaylene Brooks, RN

## 2012-02-18 NOTE — Patient Instructions (Addendum)
Please take the medications as prescribed. I will call you with the labs results if they come back abnormal otherwise you will receive a letter. Smoking cessation is important for your health. We can help you when your are ready to quit. Make your next appointment in 3 months or sooner if needed

## 2012-02-18 NOTE — Assessment & Plan Note (Signed)
S/p endarterectomy in July 2013. Pt next f/u in 6 months. Recommended ASA and smoking cessation.

## 2012-02-18 NOTE — Assessment & Plan Note (Addendum)
For more details in assessment of his DM please refer to HPI. Plan: Continue current regimen. A1C not obtained today since last one was close to target 2 months ago. Will recheck next visit. Smoking cessation counseling offered.

## 2012-02-18 NOTE — Assessment & Plan Note (Signed)
Labs obtained today per pt's request, since he f/u his CKD with Dr. Lacy Duverney.

## 2012-02-18 NOTE — Assessment & Plan Note (Signed)
Not ready to quit. Counseling of tobacco cessation offered.

## 2012-02-18 NOTE — Assessment & Plan Note (Signed)
This is my first time assessing pt's chronic pain. I am concerned about this situation for a couple of reasons: 1)  Pt's extremely demanding and almost rude attitude regarding narcotic pain meds prescription. 2) Pt previously dismissed from Pain Clinic.3) no record of previous UDS obtained in our clinic for monitoring. 4) Amount of medication/dose required.  Plan: Pain contract establish and signed. Refilled his percocet prescription with same amount of tablets given in the last emitted prescription. I will ask our faculty to look into narcotic database for information. Pt next visit will have a UDS sample with Opiod panel.

## 2012-02-20 ENCOUNTER — Other Ambulatory Visit: Payer: Self-pay | Admitting: Family Medicine

## 2012-02-20 ENCOUNTER — Encounter: Payer: Self-pay | Admitting: Family Medicine

## 2012-02-20 ENCOUNTER — Ambulatory Visit (INDEPENDENT_AMBULATORY_CARE_PROVIDER_SITE_OTHER): Payer: Medicare Other | Admitting: Family Medicine

## 2012-02-20 VITALS — BP 139/69 | HR 89 | Temp 98.8°F | Ht 71.0 in | Wt 198.4 lb

## 2012-02-20 DIAGNOSIS — E1165 Type 2 diabetes mellitus with hyperglycemia: Secondary | ICD-10-CM

## 2012-02-20 DIAGNOSIS — E118 Type 2 diabetes mellitus with unspecified complications: Secondary | ICD-10-CM

## 2012-02-20 DIAGNOSIS — M549 Dorsalgia, unspecified: Secondary | ICD-10-CM

## 2012-02-20 MED ORDER — NORTRIPTYLINE HCL 25 MG PO CAPS: 10.0000 mg | ORAL_CAPSULE | Freq: Every day | ORAL | Status: DC

## 2012-02-20 MED ORDER — GABAPENTIN 100 MG PO CAPS: 100.0000 mg | ORAL_CAPSULE | Freq: Three times a day (TID) | ORAL | Status: DC

## 2012-02-20 MED ORDER — OXYCODONE-ACETAMINOPHEN 7.5-325 MG PO TABS: 1.0000 | ORAL_TABLET | ORAL | Status: AC | PRN

## 2012-02-20 NOTE — Patient Instructions (Addendum)
Please start taking gabapentin 100 mg every 8 hours. Try to use Oxycontin only if needed. Keep a log about how much are you taking daily. Increase dose of  Nortriptyline to 25 mg at night time.  Make an appointment in a month for pain management

## 2012-02-21 LAB — DRUG SCREEN, URINE
Amphetamine Screen, Ur: NEGATIVE
Benzodiazepines.: NEGATIVE
Cocaine Metabolites: NEGATIVE
Marijuana Metabolite: NEGATIVE
Opiates: NEGATIVE
Phencyclidine (PCP): NEGATIVE

## 2012-02-21 NOTE — Assessment & Plan Note (Addendum)
The lenght of the visit exceeded  30 min. I am extremely concerned of this regimen of high dose of short acting narcotic for Chronic Pain. Wife was positive at the begining but threats of  "going to the ED if he does not get better" was one of her statements. At the end of discussion we established a plan to reduce amount of Percocet with the help of other medications that are more effective for chronic pain syndrome.  Case discussed in depth with Dr. Leveda Anna who is agreeable with our plan. Plan  Percocet will be prescribed same dose and 120 tbs but they will not be refilled before October 18th (5 weeks from today) . Pt should come a week early to discuss how treatment is working for him. He will come with a log of how much medication is needed daily.  Will increase nortriptyline from 10 to 25 mg QHS  Will add gabapentin started at small dose 100 mg TID and with the intention to increase dose until symptoms are controlled.  UDS with Opioid panel since pt states that he is taking his percocet every day and only 2 tablet are left from the 30 tab prescription a gave him 7 days ago.

## 2012-02-21 NOTE — Progress Notes (Signed)
  Subjective:    Patient ID: John [name], male    DOB: Apr 17, 1956, 56 y.o.   MRN: [id number]  HPI Patient comes today to discuss pain management. He comes accompanied by his wife who is authorized per pt to stay during visit and contribute to the discussion.   His pain consists in chronic back pain s/pt spine surgery in 2006 for Anterior cervical diskectomy and fusion at C3-4 and C4-5 with 5 mm Life Net wedges and a 40 mm Atlantis vision plate.  Patient's pain in his lower back. No radiation to the legs, does not interfere with gait. Denies urinary or fecal incontinence. No saddle anesthesia. He states that his pain without medication is 8/10 and treatment makes him live with a 4/10  taking4 tabs of percocet 7.5mg  every day which help him also with function. He has tried in the past several adjustment but this regimen has help him for about 2 years. Denies use of recreational drugs or alcohol.   His pain was previously managed at pain clinic level, from which his was dismissed  about 2 years ago. I personally called the clinic and without patient release of information, the clinic staff refused to inform to [place] the reasons why the patient was dismissed. Patient told us that the reason was that the UDS was negative for opioids. He states that he was taking Kayexalate and "that cleared his urine from potassium and for opioids".   In his prior visit a week ago (after pain contract agreement signed) I prescribed Oxycodone 7.5/ 325 for 30 tablets refills 0, in concordance with last prescription emitted. Patient filled the prescription and then argue with out clinic staff that he used to take 4 tablets a day and his prescriptions are 120 tablets per month. After this pt was instructed to come for discussion of pain management.  Review of Systems  Constitutional: Negative for fever, chills and unexpected weight change.  HENT: Negative.   Respiratory: Negative for cough, chest tightness and shortness of  breath.   Cardiovascular: Negative for chest pain, palpitations and leg swelling.  Gastrointestinal: Negative for nausea, vomiting and constipation.  Genitourinary: Negative for dysuria, frequency and difficulty urinating.  Musculoskeletal: Positive for back pain.  Neurological: Negative for dizziness, tremors, weakness, numbness and headaches.      Objective:   Physical Exam  Constitutional: He is oriented to person, place, and time.  Musculoskeletal: Normal range of motion. He exhibits no edema.  Neurological: He is alert and oriented to person, place, and time. He has normal strength and normal reflexes. He displays no atrophy and no tremor. No sensory deficit. Gait normal.      Assessment & Plan:

## 2012-02-27 ENCOUNTER — Telehealth: Payer: Self-pay | Admitting: *Deleted

## 2012-02-27 NOTE — Telephone Encounter (Signed)
Baxter Hire from [employer] with [employer].  States she went out to do a home visit with patient.  He has a history of spinal injury with lower extremity weakness.  Mr. Novelo informed her that in the last two months he has had a lot of falls due to weakness in his legs and then his legs give way and he falls.  She did encourage Mr. Mcomber to call and schedule an office visit with his PCP. Per Protocol she just has to call this in to his doctor.  This is just an [place].  Ileana Ladd

## 2012-03-02 ENCOUNTER — Other Ambulatory Visit: Payer: Self-pay | Admitting: Family Medicine

## 2012-03-03 ENCOUNTER — Other Ambulatory Visit: Payer: Self-pay | Admitting: *Deleted

## 2012-03-04 MED ORDER — BACLOFEN 10 MG PO TABS: 10.0000 mg | ORAL_TABLET | Freq: Two times a day (BID) | ORAL | Status: DC

## 2012-03-05 ENCOUNTER — Telehealth: Payer: Self-pay | Admitting: Family Medicine

## 2012-03-05 NOTE — Telephone Encounter (Signed)
Pt called and is asking to speak with head doctor about his discussion with Dr Aviva Signs - stated that the kayexalate is cleaning his system out so that nothing shows up on his drug screen

## 2012-03-05 NOTE — Telephone Encounter (Addendum)
Called Pt today to ask about his well being. He states that his leg still hurts no matter how much medication he takes. I wanted to confirm dose and frequency of medication he took the day we obtained his urine for UDS. Pt states that he takes Percocet 7.5/325 every 6 hours around the clock and that the morning of Sep 13 he took his habitual dose. I explained that UDS results are negative for opiates witch it does not corresponds with what he is telling me. The reason I have waited to inform pt is because I was inquiring with nephrology and pharmacy specialists about pt's conditions/medications and interpretation of this results. The answer was that if pt is taking opioids at the dose and frequency prescribed there is no reason why his UDS is negative, and with his renal impairment it should be higher since pt is not  able to excrete well the metabolites. With that information I decided to let pt know this results and my inability to further prescribe opioids for him for pain contract violation. Pt says that this has happen to him in the past and was argumentative, but finally sounded aggreable, I will be happy to continue his care, but not chronic pain management with opioids therapy.  D. Piloto Rolene Arbour, MD Family Medicine  PGY-2

## 2012-03-05 NOTE — Telephone Encounter (Signed)
Spoke with patient he believes his negative drug screen is due to his kayexalate.  I informed him that I would check to see if I could corroborate his assertion   A Google search did not reveal any such interaction.   I spoke with Dr Raymondo Band our Pharmd and he had no knowledge of that kayexalate could cause this.  I will discuss with Dr Aviva Signs and support her decision and I will inform patient accordingly   CHAMBLISS,MARSHALL L

## 2012-03-10 ENCOUNTER — Encounter: Payer: Self-pay | Admitting: Family Medicine

## 2012-03-10 ENCOUNTER — Ambulatory Visit (INDEPENDENT_AMBULATORY_CARE_PROVIDER_SITE_OTHER): Payer: Medicare Other | Admitting: Family Medicine

## 2012-03-10 ENCOUNTER — Ambulatory Visit: Payer: Medicare Other | Admitting: Family Medicine

## 2012-03-10 VITALS — BP 155/70 | HR 99 | Temp 98.5°F | Wt 196.4 lb

## 2012-03-10 DIAGNOSIS — M549 Dorsalgia, unspecified: Secondary | ICD-10-CM

## 2012-03-10 DIAGNOSIS — E875 Hyperkalemia: Secondary | ICD-10-CM

## 2012-03-10 DIAGNOSIS — E785 Hyperlipidemia, unspecified: Secondary | ICD-10-CM

## 2012-03-10 MED ORDER — PRAVASTATIN SODIUM 40 MG PO TABS: 40.0000 mg | ORAL_TABLET | Freq: Every day | ORAL | Status: DC

## 2012-03-10 MED ORDER — NORTRIPTYLINE HCL 25 MG PO CAPS: 10.0000 mg | ORAL_CAPSULE | Freq: Every day | ORAL | Status: DC

## 2012-03-10 NOTE — Assessment & Plan Note (Signed)
Last LDL 76. Goal under 70. Pt on simvastatin 80 mg and on amlodipine as well. Due to possible dose related interaction we change simvastatin to Pravachol 40 mg daily. Will recheck lipid profile next visit.

## 2012-03-10 NOTE — Patient Instructions (Addendum)
If Physical therapy is an option you would like to consider let me know and I will place a referral. Make an appointment to follow up your chronic conditions ( Hypertension, Diabetes, cholesterol, COPD)

## 2012-03-10 NOTE — Assessment & Plan Note (Addendum)
Pt still under last Percocet Prescription with supposedly  44 tb left per calculation. Comes today with a walker and pain 10/10 described as worse pain ever to discuss breach in opioid pain management contract. With this pt too many red flags are risen regarding his chronic pain opioid management. He has been dismissed from pain clinic before and not accepted on any other pain clinics. Was here for 3 years on short acting Oxycodone, one trial of long acting that he refused to continued. Dose increased from 5/325 PRN to 7.5/325 every 6h scheduled (not PRN) Never a UDS done during all this time. UDS done on 02/20/2012 negative for opioids.  Discussed with Armed forces technical officer. Time spent on Office visit 32 min. Plan: Continue Nortriptyline 25 mg since this medication may take three weeks to start showing benefits. I am concerned about compliance. Gabapentin: right medication for pt since he also has hx of DM neuropathy. Pt dislike medication and even with sub therapeutic dose he reports side effects of "falls". Will discontinue medication. With pt kidney function NSAIDs are not appropiate. Offered: Phsysical therapy, Lidoderm patch. Pt declined Discontinued Opioids.

## 2012-03-10 NOTE — Assessment & Plan Note (Signed)
Appointment yesterday with Dr. Ervin Knack. kalexate ordered. Pt states that hi is taking indicated dose.

## 2012-03-10 NOTE — Progress Notes (Signed)
  Subjective:    Patient ID: John [name], male    DOB: 1955-11-07, 56 y.o.   MRN: [id number]  HPI Pt comes today to discuss recent urine test results. Last visit as part of protocol for chronic pain management we obtained a sample for Urine Drug Screen. This results came back negative and I informed over the phone to pt that this is a breach of contract and that I will not be able to prescribe opioids for him. Situation also discussed with our clinic director who called and inform pt as well.   Pt comes today accompanied by his wife who is very involved on pt's pain regimen and wanted to know details of the situation. I provide with results showed on the screen of room computer.   Last prescription of Percocet 7.5/325 mg was given on Sep 13 for 120 tablets. Today is day 19. He should still have 44 tablet left and he comes with a walker and stating that his pain is worse 10/10" worse pain ever" to the point that he can't fish or do things he likes to do. He also states that gabapentin has made him fall 3 times last week.   He denies hurting himself with this falls and does not give details about them, but doesn't want to continue taking Gabapentin. He states that he doesn't feel any changes on his pain control with the increase in dose of Nortriptyline 2.5 weeks ago.  He sates that yesterday went to Nephrology with Dr. Kathrene Bongo to f/u his elevated potasium and ordered kalexate which he does not like to take due to secondary side effects of diarrhea, but states that took the indicated dose.  Review of Systems  Constitutional: Negative for diaphoresis.  Respiratory: Negative for shortness of breath.   Cardiovascular: Negative for chest pain and palpitations.  Gastrointestinal: Positive for diarrhea. Negative for nausea and vomiting.       Due to [facility] taken yesterday.   Musculoskeletal: Positive for back pain.  Neurological: Negative for dizziness, weakness and numbness.       Objective:    Physical Exam Vitals reviewed. No physical exam was performed in this visit.        Assessment & Plan:

## 2012-03-16 ENCOUNTER — Telehealth: Payer: Self-pay | Admitting: Family Medicine

## 2012-03-16 NOTE — Telephone Encounter (Signed)
Mr. Wing call to say that he did some research on drug interactions with his pain med and discovered that sodium bicarbonate can cause other meds to not be present in system when taking on a regular basis.  Think this is why his last urine test did not show traces of his pain med.  Want to have provider prescribe his percocet back.  Please call patient to discuss

## 2012-03-16 NOTE — Telephone Encounter (Signed)
Forwarded to pcp for follow up.John [name]  

## 2012-03-17 ENCOUNTER — Telehealth: Payer: Self-pay | Admitting: Family Medicine

## 2012-03-17 NOTE — Telephone Encounter (Signed)
Called pt regarding a message he left to front staff about the prescription of Percocet.  On last visit we talked extensively about this, and once more today, I explained that his negative urine test is only one of many reasons why I think it is inappropriate and not acting on his best interest the treatment with opioids for his chronic pain management. This is the 7th time in a month period this issue gets addressed with me on a office visit based or by telephone with pressure and harrassment which I take as irregular behavior and adds to the list of reasons.  I emphasized on this call that I am happy to continue his medical care but not his Chronic Pain Management with Narcotics.

## 2012-03-18 NOTE — Telephone Encounter (Signed)
Please refer to telephone note.

## 2012-03-24 ENCOUNTER — Encounter: Payer: Self-pay | Admitting: Family Medicine

## 2012-03-24 ENCOUNTER — Other Ambulatory Visit: Payer: Self-pay | Admitting: Family Medicine

## 2012-03-24 ENCOUNTER — Ambulatory Visit (INDEPENDENT_AMBULATORY_CARE_PROVIDER_SITE_OTHER): Payer: Medicare Other | Admitting: Family Medicine

## 2012-03-24 DIAGNOSIS — E1149 Type 2 diabetes mellitus with other diabetic neurological complication: Secondary | ICD-10-CM

## 2012-03-24 MED ORDER — OXYCODONE-ACETAMINOPHEN 7.5-325 MG PO TABS: 1.0000 | ORAL_TABLET | Freq: Four times a day (QID) | ORAL | Status: DC | PRN

## 2012-03-24 NOTE — Progress Notes (Signed)
  Subjective:    Patient ID: John [name], male    DOB: 01/14/1956, 56 y.o.   MRN: [id number]  HPI  Patient here to establish care. I'll follow the rest of his family and he would like to go ahead and switch to my practice. He has a lot of chronic problems. Most recently he has been trying to transition off opioids pain medicines and has not been very successful. The transition to gabapentin gave him increased dizziness and he had a couple falls. He is with his wife today here in the clinic.  Review of Systems Increased falls. Dizziness that resolved with his discontinuation of gabapentin. Chronic pain.    Objective:   Physical Exam  GENERAL: Well-developed man using a walker, no acute distress. Gait: He has a forward leaning posture in a shuffling gait. As a limp on the left. CARDIOVASCULAR: Regular rate and rhythm PSYCHIATRIC: Alert and oriented x4. Affect is interactive. He asks and answers questions appropriately. His recall for recent and remote memory is intact.      Assessment & Plan:  #1. Multiple medical problems with chronic pain syndrome. A daycare all the rest of his family in another fairly well so I'll happily except in my practice. I like to see him for complete physical the next 2-3 weeks so we can get to know his medical problems better. Right now returning to his previous pain medication regimen and we can work on tapering that down to something that he's more comfortable with in the future.

## 2012-03-24 NOTE — Telephone Encounter (Signed)
This medication is not listed on his home meds. I believe he takes kayexalate. Please verify. Thanks

## 2012-03-29 NOTE — Progress Notes (Signed)
See additional note same date

## 2012-04-02 ENCOUNTER — Ambulatory Visit: Payer: Medicare Other | Admitting: Family Medicine

## 2012-04-06 ENCOUNTER — Telehealth: Payer: Self-pay | Admitting: Family Medicine

## 2012-04-06 NOTE — Telephone Encounter (Signed)
Is asking about the form for the diabetic testing supplies that was sent to Dr Aviva Signs.  Pt forgot to bring in form and it was then faxed by the company.  They are asking for status.

## 2012-04-06 NOTE — Telephone Encounter (Signed)
Dr. Aviva Signs,   do you know anything about this form

## 2012-04-08 NOTE — Telephone Encounter (Signed)
I have not received this forms.

## 2012-04-13 ENCOUNTER — Telehealth: Payer: Self-pay | Admitting: Family Medicine

## 2012-04-13 NOTE — Telephone Encounter (Signed)
Patient dropped off forms to be filled out for diabetic supplies.  Please fax when completed.

## 2012-04-14 ENCOUNTER — Ambulatory Visit: Payer: Medicare Other | Admitting: Family Medicine

## 2012-04-14 NOTE — Telephone Encounter (Signed)
Form placed in Dr. Donnetta Hail box.

## 2012-04-20 NOTE — Telephone Encounter (Signed)
Done and faxed John [name]  

## 2012-04-20 NOTE — Telephone Encounter (Signed)
Form faxed and original placed in mail in envelope  provided.

## 2012-04-21 ENCOUNTER — Ambulatory Visit (INDEPENDENT_AMBULATORY_CARE_PROVIDER_SITE_OTHER): Payer: Medicare Other | Admitting: Family Medicine

## 2012-04-21 ENCOUNTER — Encounter: Payer: Self-pay | Admitting: Family Medicine

## 2012-04-21 VITALS — BP 127/66 | HR 92 | Temp 98.8°F | Wt 197.0 lb

## 2012-04-21 DIAGNOSIS — E1165 Type 2 diabetes mellitus with hyperglycemia: Secondary | ICD-10-CM

## 2012-04-21 DIAGNOSIS — E118 Type 2 diabetes mellitus with unspecified complications: Secondary | ICD-10-CM

## 2012-04-21 DIAGNOSIS — M549 Dorsalgia, unspecified: Secondary | ICD-10-CM

## 2012-04-21 LAB — COMPREHENSIVE METABOLIC PANEL
AST: 13 U/L (ref 0–37)
Albumin: 4.1 g/dL (ref 3.5–5.2)
BUN: 59 mg/dL — ABNORMAL HIGH (ref 6–23)
CO2: 23 mEq/L (ref 19–32)
Calcium: 8.4 mg/dL (ref 8.4–10.5)
Chloride: 108 mEq/L (ref 96–112)
Glucose, Bld: 136 mg/dL — ABNORMAL HIGH (ref 70–99)
Potassium: 4.8 mEq/L (ref 3.5–5.3)

## 2012-04-21 MED ORDER — TAMSULOSIN HCL 0.4 MG PO CAPS: 0.4000 mg | ORAL_CAPSULE | Freq: Every day | ORAL | Status: DC

## 2012-04-21 MED ORDER — BACLOFEN 10 MG PO TABS: 10.0000 mg | ORAL_TABLET | Freq: Two times a day (BID) | ORAL | Status: DC

## 2012-04-21 MED ORDER — OXYCODONE-ACETAMINOPHEN 7.5-325 MG PO TABS: 1.0000 | ORAL_TABLET | Freq: Four times a day (QID) | ORAL | Status: DC | PRN

## 2012-04-22 ENCOUNTER — Encounter: Payer: Self-pay | Admitting: Family Medicine

## 2012-04-23 ENCOUNTER — Other Ambulatory Visit: Payer: Self-pay | Admitting: Family Medicine

## 2012-04-23 ENCOUNTER — Encounter: Payer: Self-pay | Admitting: Family Medicine

## 2012-04-23 DIAGNOSIS — G959 Disease of spinal cord, unspecified: Secondary | ICD-10-CM | POA: Insufficient documentation

## 2012-04-23 NOTE — Progress Notes (Signed)
Dear [name] Team Can u set this up [facility]! John [name]

## 2012-04-23 NOTE — Progress Notes (Signed)
  Subjective:    Patient ID: John [name], male    DOB: 1956/01/20, 56 y.o.   MRN: [id number]  HPI #1. Followup chronic pain. Reason maintained on narcotics. Here for discussion. Most of his pain is related to back injury he suffered many years ago. Was thinking care of a pain clinic but was terminated secondary to apparent noncompliance. #2.Chronic kidney disease. Continues to have lower extremity edema at times. This increases his lower stream any pain.   Review of Systems Denies fever, chills, shortness of breath.    Objective:   Physical Exam  Vital signs reviewed. GENERAL: Well-developed, well-nourished, no acute distress. CARDIOVASCULAR: Regular rate and rhythm no murmur gallop or rub LUNGS: Clear to auscultation bilaterally, no rales or wheeze. ABDOMEN: Soft positive bowel sounds MSK: Movement of extremity x 4.He is walking with a walker        Assessment & Plan:

## 2012-04-23 NOTE — Assessment & Plan Note (Signed)
Long discussion with him regarding pain issues. His back pain seems to have been getting somewhat worse. Are reviewed his old MRI of his lumbar spine and of his neck. Certainly given his symptoms I think we need to repeat his neck MRI. We'll check his kidney function first. We will not be able to get it with contrast secondary to renal disease. I will set that up in the back in contact with him. Right now I refilled his narcotic medication for we will definitely be changing to something different in the future.

## 2012-04-23 NOTE — Assessment & Plan Note (Signed)
We'll recheck his creatinine today. He is being followed by renal.

## 2012-04-26 ENCOUNTER — Telehealth: Payer: Self-pay | Admitting: *Deleted

## 2012-04-26 NOTE — Telephone Encounter (Signed)
Spoke with patient and gave MRI appointment information. Set up for Wednesday 11/20 @ 9am @ [facility] arrive @ 8:45am. Patient agreed to this

## 2012-04-28 ENCOUNTER — Ambulatory Visit (HOSPITAL_COMMUNITY)
Admission: RE | Admit: 2012-04-28 | Discharge: 2012-04-28 | Disposition: A | Discharge status: Home / Self Care | Payer: Medicare Other | Source: Ambulatory Visit | Attending: Family Medicine | Admitting: Family Medicine

## 2012-04-28 DIAGNOSIS — M4712 Other spondylosis with myelopathy, cervical region: Secondary | ICD-10-CM | POA: Insufficient documentation

## 2012-04-28 DIAGNOSIS — G959 Disease of spinal cord, unspecified: Secondary | ICD-10-CM

## 2012-04-28 DIAGNOSIS — Z981 Arthrodesis status: Secondary | ICD-10-CM | POA: Insufficient documentation

## 2012-05-03 ENCOUNTER — Encounter: Payer: Self-pay | Admitting: Family Medicine

## 2012-05-19 ENCOUNTER — Encounter: Payer: Self-pay | Admitting: Family Medicine

## 2012-05-19 ENCOUNTER — Ambulatory Visit (INDEPENDENT_AMBULATORY_CARE_PROVIDER_SITE_OTHER): Payer: Medicare Other | Admitting: Family Medicine

## 2012-05-19 VITALS — BP 124/65 | HR 106 | Temp 98.7°F | Wt 186.2 lb

## 2012-05-19 DIAGNOSIS — E1149 Type 2 diabetes mellitus with other diabetic neurological complication: Secondary | ICD-10-CM

## 2012-05-19 DIAGNOSIS — M79676 Pain in unspecified toe(s): Secondary | ICD-10-CM

## 2012-05-19 DIAGNOSIS — E1165 Type 2 diabetes mellitus with hyperglycemia: Secondary | ICD-10-CM

## 2012-05-19 DIAGNOSIS — E118 Type 2 diabetes mellitus with unspecified complications: Secondary | ICD-10-CM

## 2012-05-19 DIAGNOSIS — M79609 Pain in unspecified limb: Secondary | ICD-10-CM

## 2012-05-19 LAB — POCT GLYCOSYLATED HEMOGLOBIN (HGB A1C): Hemoglobin A1C: 9

## 2012-05-19 MED ORDER — OXYCODONE-ACETAMINOPHEN 7.5-325 MG PO TABS: 1.0000 | ORAL_TABLET | Freq: Four times a day (QID) | ORAL | Status: DC | PRN

## 2012-05-19 NOTE — Progress Notes (Signed)
  Subjective:    Patient ID: John [name], male    DOB: 17-Mar-1956, 56 y.o.   MRN: [id number]  HPI Complaining of right toe bleeding and pain for last 2-3 days. Is worried it's going get infected. Did not hit it or cut it on anything, says this can just crack. #2. Followup chronic pain. He's felt much better on the Percocet that he had on the tramadol. He's off his walker and able to name to light without a cane most of the time. Feels like a really helps him get through his day with a lot more energy when he does not have to battle chronic low back and leg pain. He is also having some pain in his upper extremities and that has improved significantly as well.   Review of Systems Denies unusual weight change, fever, sweats, chills    Objective:   Physical Exam  Vital signs are reviewed GENERAL: Well-developed male no acute distress EXTREMITY: Right. Interspace doing the fourth and fifth toes is macerated and bleeding. The skin on the entire foot is somewhat thickened and dry. He has decreased sensation to soft touch and to pinprick all along the plantar portion of his foot and in the scatter pattern on the top of his foot. Dorsalis pedis pulses 1+ bilaterally equal. The skin on his feet is slightly hyperpigmented. His nails are dystrophic.      Assessment & Plan:  #1. Maceration of skin between the web space 4 and 5 on the right toe. I cleaned it today with some peroxide, used a silver nitrate stick to get hemostasis. Then I placed a Xeroform dressing and a Telfa dressing in between the fourth and fifth digits and wrap the entire forefoot and Curlex. I would have him. Her postop shoe or boot both of which she has access to at home for the next 4-5 days. I would expect this to heal up over the next week. I have given them some dressing changes. If he does not improve or for worsens to let be now. #2. Chronic pain. I still like to get him into a pain clinic so we'll try to set that up.

## 2012-05-20 ENCOUNTER — Other Ambulatory Visit: Payer: Self-pay | Admitting: Family Medicine

## 2012-05-22 ENCOUNTER — Other Ambulatory Visit: Payer: Self-pay | Admitting: Family Medicine

## 2012-05-25 ENCOUNTER — Encounter: Payer: Self-pay | Admitting: Family Medicine

## 2012-05-27 ENCOUNTER — Other Ambulatory Visit: Payer: Self-pay | Admitting: Family Medicine

## 2012-05-27 DIAGNOSIS — Z8679 Personal history of other diseases of the circulatory system: Secondary | ICD-10-CM

## 2012-05-27 DIAGNOSIS — M549 Dorsalgia, unspecified: Secondary | ICD-10-CM

## 2012-05-27 DIAGNOSIS — E1149 Type 2 diabetes mellitus with other diabetic neurological complication: Secondary | ICD-10-CM

## 2012-05-27 DIAGNOSIS — I739 Peripheral vascular disease, unspecified: Secondary | ICD-10-CM

## 2012-05-27 DIAGNOSIS — R269 Unspecified abnormalities of gait and mobility: Secondary | ICD-10-CM

## 2012-05-27 DIAGNOSIS — M1710 Unilateral primary osteoarthritis, unspecified knee: Secondary | ICD-10-CM

## 2012-05-27 DIAGNOSIS — G959 Disease of spinal cord, unspecified: Secondary | ICD-10-CM

## 2012-05-28 ENCOUNTER — Encounter (HOSPITAL_COMMUNITY): Payer: Self-pay

## 2012-05-28 ENCOUNTER — Inpatient Hospital Stay (HOSPITAL_COMMUNITY)
Admission: EM | Admit: 2012-05-28 | Discharge: 2012-05-31 | DRG: 603 | Disposition: A | Discharge status: Home / Self Care | Payer: Medicare Other | Attending: Family Medicine | Admitting: Family Medicine

## 2012-05-28 ENCOUNTER — Emergency Department (HOSPITAL_COMMUNITY): Payer: Medicare Other

## 2012-05-28 ENCOUNTER — Telehealth: Payer: Self-pay | Admitting: Family Medicine

## 2012-05-28 DIAGNOSIS — J4489 Other specified chronic obstructive pulmonary disease: Secondary | ICD-10-CM | POA: Diagnosis present

## 2012-05-28 DIAGNOSIS — G8929 Other chronic pain: Secondary | ICD-10-CM | POA: Diagnosis present

## 2012-05-28 DIAGNOSIS — E1129 Type 2 diabetes mellitus with other diabetic kidney complication: Secondary | ICD-10-CM | POA: Diagnosis present

## 2012-05-28 DIAGNOSIS — N179 Acute kidney failure, unspecified: Secondary | ICD-10-CM | POA: Diagnosis present

## 2012-05-28 DIAGNOSIS — I10 Essential (primary) hypertension: Secondary | ICD-10-CM | POA: Diagnosis present

## 2012-05-28 DIAGNOSIS — N186 End stage renal disease: Secondary | ICD-10-CM | POA: Diagnosis present

## 2012-05-28 DIAGNOSIS — M549 Dorsalgia, unspecified: Secondary | ICD-10-CM | POA: Diagnosis present

## 2012-05-28 DIAGNOSIS — L03115 Cellulitis of right lower limb: Secondary | ICD-10-CM | POA: Diagnosis present

## 2012-05-28 DIAGNOSIS — E875 Hyperkalemia: Secondary | ICD-10-CM | POA: Diagnosis present

## 2012-05-28 DIAGNOSIS — J449 Chronic obstructive pulmonary disease, unspecified: Secondary | ICD-10-CM | POA: Diagnosis present

## 2012-05-28 DIAGNOSIS — N289 Disorder of kidney and ureter, unspecified: Secondary | ICD-10-CM

## 2012-05-28 DIAGNOSIS — L02619 Cutaneous abscess of unspecified foot: Principal | ICD-10-CM | POA: Diagnosis present

## 2012-05-28 DIAGNOSIS — E1142 Type 2 diabetes mellitus with diabetic polyneuropathy: Secondary | ICD-10-CM | POA: Diagnosis present

## 2012-05-28 DIAGNOSIS — Z885 Allergy status to narcotic agent status: Secondary | ICD-10-CM

## 2012-05-28 DIAGNOSIS — D631 Anemia in chronic kidney disease: Secondary | ICD-10-CM | POA: Diagnosis present

## 2012-05-28 DIAGNOSIS — Z8249 Family history of ischemic heart disease and other diseases of the circulatory system: Secondary | ICD-10-CM

## 2012-05-28 DIAGNOSIS — L0390 Cellulitis, unspecified: Secondary | ICD-10-CM

## 2012-05-28 DIAGNOSIS — I129 Hypertensive chronic kidney disease with stage 1 through stage 4 chronic kidney disease, or unspecified chronic kidney disease: Secondary | ICD-10-CM | POA: Diagnosis present

## 2012-05-28 DIAGNOSIS — I739 Peripheral vascular disease, unspecified: Secondary | ICD-10-CM | POA: Diagnosis present

## 2012-05-28 DIAGNOSIS — E785 Hyperlipidemia, unspecified: Secondary | ICD-10-CM | POA: Diagnosis present

## 2012-05-28 DIAGNOSIS — E1149 Type 2 diabetes mellitus with other diabetic neurological complication: Secondary | ICD-10-CM | POA: Diagnosis present

## 2012-05-28 DIAGNOSIS — N183 Chronic kidney disease, stage 3 unspecified: Secondary | ICD-10-CM | POA: Diagnosis present

## 2012-05-28 LAB — COMPREHENSIVE METABOLIC PANEL
AST: 12 U/L (ref 0–37)
BUN: 65 mg/dL — ABNORMAL HIGH (ref 6–23)
CO2: 22 mEq/L (ref 19–32)
Calcium: 9.3 mg/dL (ref 8.4–10.5)
Creatinine, Ser: 4.09 mg/dL — ABNORMAL HIGH (ref 0.50–1.35)
GFR calc non Af Amer: 15 mL/min — ABNORMAL LOW (ref >90)
Potassium: 5.7 mEq/L — ABNORMAL HIGH (ref 3.5–5.1)
Sodium: 140 mEq/L (ref 135–145)
Total Bilirubin: 0.3 mg/dL (ref 0.3–1.2)

## 2012-05-28 LAB — CBC WITH DIFFERENTIAL/PLATELET
Eosinophils Absolute: 0.1 10*3/uL (ref 0.0–0.7)
Lymphs Abs: 1.6 10*3/uL (ref 0.7–4.0)
MCH: 28.3 pg (ref 26.0–34.0)
Neutro Abs: 8.5 10*3/uL — ABNORMAL HIGH (ref 1.7–7.7)
Neutrophils Relative %: 78 % — ABNORMAL HIGH (ref 43–77)
Platelets: 205 10*3/uL (ref 150–400)
RBC: 4.14 MIL/uL — ABNORMAL LOW (ref 4.22–5.81)
WBC: 10.9 10*3/uL — ABNORMAL HIGH (ref 4.0–10.5)

## 2012-05-28 MED ORDER — PIPERACILLIN-TAZOBACTAM 3.375 G IVPB
3.3750 g | Freq: Once | INTRAVENOUS | Status: AC
  Administered 2012-05-28: 3.375 g via INTRAVENOUS
  Filled 2012-05-28: qty 50

## 2012-05-28 MED ORDER — SODIUM CHLORIDE 0.9 % IV SOLN
1.0000 g | Freq: Once | INTRAVENOUS | Status: AC
  Administered 2012-05-28: 1 g via INTRAVENOUS
  Filled 2012-05-28: qty 10

## 2012-05-28 MED ORDER — VANCOMYCIN HCL IN DEXTROSE 1-5 GM/200ML-% IV SOLN
1000.0000 mg | Freq: Once | INTRAVENOUS | Status: AC
  Administered 2012-05-28: 1000 mg via INTRAVENOUS
  Filled 2012-05-28: qty 200

## 2012-05-28 MED ORDER — SODIUM CHLORIDE 0.9 % IV BOLUS (SEPSIS)
1000.0000 mL | Freq: Once | INTRAVENOUS | Status: AC
  Administered 2012-05-28: 1000 mL via INTRAVENOUS

## 2012-05-28 MED ORDER — INSULIN ASPART 100 UNIT/ML ~~LOC~~ SOLN
10.0000 [IU] | Freq: Once | SUBCUTANEOUS | Status: AC
  Administered 2012-05-28: 10 [IU] via INTRAVENOUS
  Filled 2012-05-28: qty 1

## 2012-05-28 NOTE — ED Notes (Signed)
Patient transported to X-ray 

## 2012-05-28 NOTE — ED Notes (Signed)
Company secretary paged.

## 2012-05-28 NOTE — ED Provider Notes (Signed)
History     CSN: [id number]  Arrival date & time 05/28/12  1747   First MD Initiated Contact with Patient 05/28/12 2008      Chief Complaint  Patient presents with  . Cellulitis    HPI  This patient with a history of diabetes, chronic renal dysfunction, now presents with concerns of red foot, pain, bilateral leg discomfort.  Symptoms began as several days ago, though there was a wound between the fourth and fifth toes on right foot for some time.  Over the past days the pain has increased, with new erythema, and focal worsening of the pain on the distal right foot.  The patient denies new fever, chills, vomiting or diarrhea.  No relief with OTC medication.  The patient has distal neuropathy.  Past Medical History  Diagnosis Date  . COPD (chronic obstructive pulmonary disease)   . Chronic back pain     Chronic narcotics for this for many years  . Diabetes mellitus   . Chronic kidney disease     CKD stage III  . Hyperlipidemia   . Hypertension   . Foot pain   . Peripheral vascular disease   . Asthma   . Stroke     No residual "Mini strokes"  . Ulcer 1985  . Fracture of foot     Compond- Right    Past Surgical History  Procedure Date  . Neck surgery   . Eye surgery   . Cardiac catheterization   . Endarterectomy 12/18/2011    Procedure: ENDARTERECTOMY CAROTID;  Surgeon: Chuck Hint, MD;  Location: [facility] OR;  Service: Vascular;  Laterality: Left;  . Carotid endarterectomy     Family History  Problem Relation Age of Onset  . Diabetes Mother   . Heart disease Mother   . Hypertension Mother   . Heart attack Mother   . Diabetes Father   . Heart disease Father   . Hypertension Father   . Heart attack Father   . Other Father     bleeding problems  . Diabetes Brother   . Heart disease Brother     History  Substance Use Topics  . Smoking status: Current Every Day Smoker -- 0.5 packs/day for 40 years    Types: Cigarettes  . Smokeless tobacco: Never Used    Comment: pt states that he is trying to quit  . Alcohol Use: No     Comment: Previous drinker - However, quit in 2006      Review of Systems  Constitutional:       Per HPI, otherwise negative  HENT:       Per HPI, otherwise negative  Eyes: Negative.   Respiratory:       Per HPI, otherwise negative  Cardiovascular:       Per HPI, otherwise negative  Gastrointestinal: Negative for vomiting.  Genitourinary: Negative.   Musculoskeletal:       Per HPI, otherwise negative  Skin: Negative.   Neurological: Negative for syncope.    Allergies  Vicodin  Home Medications   Current Outpatient Rx  Name  Route  Sig  Dispense  Refill  . ALBUTEROL SULFATE HFA 108 (90 BASE) MCG/ACT IN AERS   Inhalation   Inhale 2 puffs into the lungs every 6 (six) hours as needed. For shortness of breath.         . AMLODIPINE BESYLATE 10 MG PO TABS   Oral   Take 10 mg by mouth daily.         Marland Kitchen  ASPIRIN 81 MG PO CHEW   Oral   Chew 81 mg by mouth daily.         Marland Kitchen BACLOFEN 10 MG PO TABS   Oral   Take 1 tablet (10 mg total) by mouth 2 (two) times daily.   30 each   1   . BACLOFEN 10 MG PO TABS   Oral   Take 1 tablet (10 mg total) by mouth 2 (two) times daily.   60 tablet   5   . CITRACAL + D PO   Oral   Take 1 tablet by mouth 2 (two) times daily.         . FUROSEMIDE 40 MG PO TABS   Oral   Take 40 mg by mouth daily.         . FUROSEMIDE 40 MG PO TABS      TAKE 1 TABLET BY MOUTH TWICE DAILY   180 tablet   0   . INSULIN GLARGINE 100 UNIT/ML [place] SOLN   Subcutaneous   Inject 30 Units into the skin every morning.         . INSULIN LISPRO (HUMAN) 100 UNIT/ML [place] SOLN   Subcutaneous   Inject 5 Units into the skin 2 (two) times daily before lunch and supper. Per sliding scale.         Marland Kitchen KIONEX 15 GM/60ML PO SUSP      TAKE 30 MLS BY MOUTH EVERY DAY   900 mL   0   . METOPROLOL SUCCINATE ER 25 MG PO TB24   Oral   Take 1 tablet (25 mg total) by mouth daily.   30  tablet   11   . NORTRIPTYLINE HCL 25 MG PO CAPS   Oral   Take 1 capsule (25 mg total) by mouth at bedtime.   30 capsule   3   . OXYCODONE-ACETAMINOPHEN 7.5-325 MG PO TABS   Oral   Take 1 tablet by mouth every 6 (six) hours as needed for pain.   120 tablet   0   . PRAVASTATIN SODIUM 40 MG PO TABS   Oral   Take 1 tablet (40 mg total) by mouth daily.   90 tablet   4     BP 132/67  Pulse 95  Temp 97.8 F (36.6 C) (Oral)  Resp 16  SpO2 97%  Physical Exam  Nursing note and vitals reviewed. Constitutional: He is oriented to person, place, and time. He appears well-developed. No distress.  HENT:  Head: Normocephalic and atraumatic.  Eyes: Conjunctivae normal and EOM are normal.  Cardiovascular: Normal rate and regular rhythm.  Exam reveals no decreased pulses.   Pulmonary/Chest: Effort normal. No stridor. No respiratory distress.  Abdominal: He exhibits no distension.  Musculoskeletal: He exhibits no edema.       Feet:  Neurological: He is alert and oriented to person, place, and time. He displays atrophy. He displays no tremor. He exhibits abnormal muscle tone. He displays no seizure activity. Coordination and gait normal.       Patient has diminished strength bilaterally in his lower extremities, he states that this is typical.  Skin: Skin is warm and dry.  Psychiatric: He has a normal mood and affect.   Distal pulses are appropriate.   ED Course  Procedures (including critical care time)  Labs Reviewed  CBC WITH DIFFERENTIAL - Abnormal; Notable for the following:    WBC 10.9 (*)     RBC 4.14 (*)  Hemoglobin 11.7 (*)     HCT 37.1 (*)     Neutrophils Relative 78 (*)     Neutro Abs 8.5 (*)     All other components within normal limits  COMPREHENSIVE METABOLIC PANEL - Abnormal; Notable for the following:    Potassium 5.7 (*)     Glucose, Bld 224 (*)     BUN 65 (*)     Creatinine, Ser 4.09 (*)     Alkaline Phosphatase 226 (*)     GFR calc non Af Amer 15  (*)     GFR calc Af Amer 17 (*)     All other components within normal limits   No results found.   No diagnosis found.  Pulse ox 95% room air normal   Date: 05/28/2012  Rate: 87  Rhythm: normal sinus rhythm  QRS Axis: normal  Intervals: normal  ST/T Wave abnormalities: normal  Conduction Disutrbances:none  Narrative Interpretation:   Old EKG Reviewed: none available Unremarkable ECG  Initial labs notable for hyperkalemia, continued renal dysfunction.  With the elevated potassium EKG was performed, this was not notable for elevated T-wave.  However, given the hyperkalemia he received both insulin and calcium gluconate.  MDM  This patient with multiple medical problems, including chronic kidney disease, diabetes, peripheral neuropathy presents with erythema, pain on the right foot, with a cutaneous lesion between the fourth and fifth toes.  X-ray does not demonstrate significant evidence of osteomyelitis.  However, given his immunocompromised state, he was provided vancomycin, Zosyn.  He was resuscitated with IV fluids, received medication for his hyperkalemia, and was admitted for further evaluation and management  Gerhard Munch, MD 05/28/12 2241

## 2012-05-28 NOTE — ED Notes (Signed)
Redness noted to top of (R) toes and foot, drainage noted from between 4th and 5th toes

## 2012-05-28 NOTE — ED Notes (Signed)
Pt reports bilateral leg pain and burning starting today, pt reports he was at a hardware store, was able to drive himself home. Pt reports Dr. [name] noticed a spot on his (R) foot between the 4th and 5th toes. Pt reports after soaking his foot today his spouse feels the area looks worse and wanted him to be evaluated

## 2012-05-28 NOTE — Telephone Encounter (Signed)
Received call from patients wife that he was seen for toe problem at [facility] last week.  Has been doing soaks but area has gotten worse.  Have not left dressing in place.  Now area looks "a whole lot worse" and "looks like the meat is going to fall off".  No surrounding redness or warmth or other signs of infection.  She would like to bring him to the ED for further evaluation, which I told her either there of urgent care would be fine.

## 2012-05-29 DIAGNOSIS — L02619 Cutaneous abscess of unspecified foot: Secondary | ICD-10-CM

## 2012-05-29 DIAGNOSIS — L03119 Cellulitis of unspecified part of limb: Secondary | ICD-10-CM

## 2012-05-29 DIAGNOSIS — E118 Type 2 diabetes mellitus with unspecified complications: Secondary | ICD-10-CM

## 2012-05-29 LAB — CBC
HCT: 33.8 % — ABNORMAL LOW (ref 39.0–52.0)
Hemoglobin: 10.6 g/dL — ABNORMAL LOW (ref 13.0–17.0)
MCV: 89.4 fL (ref 78.0–100.0)
RBC: 3.78 MIL/uL — ABNORMAL LOW (ref 4.22–5.81)
RDW: 14 % (ref 11.5–15.5)
WBC: 9.5 10*3/uL (ref 4.0–10.5)

## 2012-05-29 LAB — BASIC METABOLIC PANEL
BUN: 65 mg/dL — ABNORMAL HIGH (ref 6–23)
Creatinine, Ser: 3.8 mg/dL — ABNORMAL HIGH (ref 0.50–1.35)
Glucose, Bld: 211 mg/dL — ABNORMAL HIGH (ref 70–99)
Potassium: 5.1 mEq/L (ref 3.5–5.1)
Sodium: 139 mEq/L (ref 135–145)

## 2012-05-29 LAB — GLUCOSE, CAPILLARY
Glucose-Capillary: 83 mg/dL (ref 70–99)
Glucose-Capillary: 146 mg/dL — ABNORMAL HIGH (ref 70–99)
Glucose-Capillary: 182 mg/dL — ABNORMAL HIGH (ref 70–99)

## 2012-05-29 MED ORDER — PIPERACILLIN-TAZOBACTAM 3.375 G IVPB
3.3750 g | Freq: Three times a day (TID) | INTRAVENOUS | Status: DC
  Administered 2012-05-29 – 2012-05-30 (×4): 3.375 g via INTRAVENOUS
  Filled 2012-05-29 (×8): qty 50

## 2012-05-29 MED ORDER — ASPIRIN 81 MG PO CHEW
81.0000 mg | CHEWABLE_TABLET | Freq: Every day | ORAL | Status: DC
  Administered 2012-05-29 – 2012-05-31 (×3): 81 mg via ORAL
  Filled 2012-05-29 (×2): qty 1

## 2012-05-29 MED ORDER — ONDANSETRON HCL 4 MG/2ML IJ SOLN: 4.0000 mg | Freq: Four times a day (QID) | INTRAMUSCULAR | Status: DC | PRN

## 2012-05-29 MED ORDER — METOPROLOL SUCCINATE ER 25 MG PO TB24
25.0000 mg | ORAL_TABLET | Freq: Every day | ORAL | Status: DC
Start: 2012-05-29 — End: 2012-05-31
  Administered 2012-05-29 – 2012-05-31 (×3): 25 mg via ORAL
  Filled 2012-05-29 (×3): qty 1

## 2012-05-29 MED ORDER — HEPARIN SODIUM (PORCINE) 5000 UNIT/ML IJ SOLN
5000.0000 [IU] | Freq: Three times a day (TID) | INTRAMUSCULAR | Status: DC
  Administered 2012-05-29 – 2012-05-31 (×8): 5000 [IU] via SUBCUTANEOUS
  Filled 2012-05-29 (×11): qty 1

## 2012-05-29 MED ORDER — VANCOMYCIN HCL IN DEXTROSE 1-5 GM/200ML-% IV SOLN
1000.0000 mg | INTRAVENOUS | Status: DC
  Filled 2012-05-29: qty 200

## 2012-05-29 MED ORDER — AMLODIPINE BESYLATE 10 MG PO TABS
10.0000 mg | ORAL_TABLET | Freq: Every day | ORAL | Status: DC
  Administered 2012-05-29 – 2012-05-31 (×3): 10 mg via ORAL
  Filled 2012-05-29 (×3): qty 1

## 2012-05-29 MED ORDER — ONDANSETRON HCL 4 MG/2ML IJ SOLN: 4.0000 mg | Freq: Three times a day (TID) | INTRAMUSCULAR | Status: AC | PRN

## 2012-05-29 MED ORDER — SODIUM CHLORIDE 0.9 % IV SOLN
INTRAVENOUS | Status: AC
  Administered 2012-05-29: 20 mL via INTRAVENOUS

## 2012-05-29 MED ORDER — SODIUM POLYSTYRENE SULFONATE 15 GM/60ML PO SUSP
15.0000 g | Freq: Once | ORAL | Status: AC
  Administered 2012-05-29: 15 g via ORAL
  Filled 2012-05-29: qty 60

## 2012-05-29 MED ORDER — NORTRIPTYLINE HCL 10 MG PO CAPS
10.0000 mg | ORAL_CAPSULE | Freq: Every day | ORAL | Status: DC
  Administered 2012-05-29 – 2012-05-30 (×3): 10 mg via ORAL
  Filled 2012-05-29 (×4): qty 1

## 2012-05-29 MED ORDER — VANCOMYCIN HCL 1000 MG IV SOLR
750.0000 mg | INTRAVENOUS | Status: DC
  Filled 2012-05-29: qty 750

## 2012-05-29 MED ORDER — VANCOMYCIN HCL IN DEXTROSE 1-5 GM/200ML-% IV SOLN
1000.0000 mg | INTRAVENOUS | Status: DC
  Administered 2012-05-29: 1000 mg via INTRAVENOUS
  Filled 2012-05-29 (×2): qty 200

## 2012-05-29 MED ORDER — OXYCODONE-ACETAMINOPHEN 5-325 MG PO TABS
1.5000 | ORAL_TABLET | Freq: Four times a day (QID) | ORAL | Status: DC | PRN
  Administered 2012-05-29 – 2012-05-31 (×6): 1.5 via ORAL
  Filled 2012-05-29 (×2): qty 2
  Filled 2012-05-29: qty 1
  Filled 2012-05-29: qty 2
  Filled 2012-05-29 (×5): qty 1

## 2012-05-29 MED ORDER — SIMVASTATIN 20 MG PO TABS
20.0000 mg | ORAL_TABLET | Freq: Every day | ORAL | Status: DC
  Administered 2012-05-29 – 2012-05-30 (×2): 20 mg via ORAL
  Filled 2012-05-29 (×3): qty 1

## 2012-05-29 MED ORDER — INSULIN ASPART 100 UNIT/ML ~~LOC~~ SOLN
0.0000 [IU] | Freq: Three times a day (TID) | SUBCUTANEOUS | Status: DC
  Administered 2012-05-29: 5 [IU] via SUBCUTANEOUS
  Administered 2012-05-29: 2 [IU] via SUBCUTANEOUS
  Administered 2012-05-29 – 2012-05-30 (×3): 3 [IU] via SUBCUTANEOUS
  Administered 2012-05-30 – 2012-05-31 (×2): 2 [IU] via SUBCUTANEOUS

## 2012-05-29 MED ORDER — SODIUM CHLORIDE 0.9 % IV SOLN: INTRAVENOUS | Status: DC

## 2012-05-29 MED ORDER — ALBUTEROL SULFATE HFA 108 (90 BASE) MCG/ACT IN AERS
2.0000 | INHALATION_SPRAY | Freq: Four times a day (QID) | RESPIRATORY_TRACT | Status: DC | PRN
  Filled 2012-05-29: qty 6.7

## 2012-05-29 MED ORDER — INSULIN GLARGINE 100 UNIT/ML ~~LOC~~ SOLN
20.0000 [IU] | Freq: Every day | SUBCUTANEOUS | Status: DC
  Administered 2012-05-29 – 2012-05-30 (×2): 20 [IU] via SUBCUTANEOUS

## 2012-05-29 MED ORDER — ONDANSETRON HCL 4 MG PO TABS: 4.0000 mg | ORAL_TABLET | Freq: Four times a day (QID) | ORAL | Status: DC | PRN

## 2012-05-29 MED ORDER — SODIUM CHLORIDE 0.9 % IV SOLN
INTRAVENOUS | Status: AC
  Administered 2012-05-29: 125 mL/h via INTRAVENOUS

## 2012-05-29 MED ORDER — SODIUM POLYSTYRENE SULFONATE 15 GM/60ML PO SUSP
15.0000 g | Freq: Every day | ORAL | Status: DC
  Administered 2012-05-30 – 2012-05-31 (×2): 15 g via ORAL
  Filled 2012-05-29 (×3): qty 60

## 2012-05-29 MED ORDER — FUROSEMIDE 40 MG PO TABS
40.0000 mg | ORAL_TABLET | Freq: Every day | ORAL | Status: DC
  Administered 2012-05-29 – 2012-05-31 (×3): 40 mg via ORAL
  Filled 2012-05-29 (×3): qty 1

## 2012-05-29 MED ORDER — BACLOFEN 10 MG PO TABS
10.0000 mg | ORAL_TABLET | Freq: Two times a day (BID) | ORAL | Status: DC
  Administered 2012-05-29 – 2012-05-31 (×6): 10 mg via ORAL
  Filled 2012-05-29 (×7): qty 1

## 2012-05-29 NOTE — H&P (Signed)
John [name] is an 56 y.o. male.   Assessment/Plan 56 yo male with multiple medical problems most notably DM with peripheral neuropathy, HTN, CKD here with complaint of foot wound  ID #Foot wound with associated cellulitis:  Worsening since being seen in clinic on 12/11.  No fluctuance or evidence for abscess. X-ray with no evidence of osteomyelitis.  Will plan to continue broad spectrum antibiotics for polymicrobial coverage.  Cellulitis extends to mid foot, will continue to monitor clinically.  Hopefull can transition to PO antibiotics in 24-48 hours if seeing improvement.  Wound care consulted for dressings.  Home percocet for pain management.   Endo #DM: Previous A1c of 9.0 indicates inadequate control of his diabetes and he does have complications stemming from this including CKD and neuropathy. I will continue his lantus at a slightly decreased dose while in the hospital (can titrate this back up to home dose if needed) and add on SSI for correction coverage.    CV #HTN:  Normotensive currently, continue home antihypertensives.    #Hyperkalemia:  EKG with no peaked T waves.  Insulin and calcium gluconate given in the ED.  This appears to be a chronic problem likely related to his CKD.  Will give one dose of kayexalate as well, recheck in am.    Renal #Acute on chronic renal failure:  Baseline Cr appears to be around 2.5-3, currently at 4.09.  Could be prerenal although he does not appear very dry.  Will hydrate gently to see if this improves.  If not improving, can consider involving renal team.  He last was seen by Dr. Kathrene [name] in October.  Pulm #COPD: Stable, continue home inhalers   FEN/GI -Carb modified diet -See assessment above for hyperkalemia -NS @ 163mL/hr  PPX -SQ Heparin  Dispo Pending clinical improvement   Chief Complaint: Foot wound  HPI: 56 yo male with multiple medical problems most notably DM with peripheral neuropathy, HTN, CKD here with complaint of  foot wound.  States that he was seen by Dr. Jennette [name] in clinic on 12/11 for same issue.  Has been using dressing as directed and using epsom salt foot soaks, however area has continued to become more irritated and somewhat painful.  Has been draining some purulent material occasionally.  Pain extends to mid-foot.  He denies fever, chill, nausea, vomiting, chest pain, fatigue.    Past Medical History  Diagnosis Date  . COPD (chronic obstructive pulmonary disease)   . Chronic back pain     Chronic narcotics for this for many years  . Diabetes mellitus   . Hyperlipidemia   . Hypertension   . Foot pain   . Peripheral vascular disease   . Asthma   . Stroke     No residual "Mini strokes"  . Ulcer 1985  . Fracture of foot     Compond- Right  . Chronic kidney disease     CKD stage III    Past Surgical History  Procedure Date  . Neck surgery   . Eye surgery   . Cardiac catheterization   . Endarterectomy 12/18/2011    Procedure: ENDARTERECTOMY CAROTID;  Surgeon: Chuck Hint, MD;  Location: [facility] OR;  Service: Vascular;  Laterality: Left;  . Carotid endarterectomy     Family History  Problem Relation Age of Onset  . Diabetes Mother   . Heart disease Mother   . Hypertension Mother   . Heart attack Mother   . Diabetes Father   . Heart disease Father   .  Hypertension Father   . Heart attack Father   . Other Father     bleeding problems  . Diabetes Brother   . Heart disease Brother    Social History:  reports that he has been smoking Cigarettes.  He has a 20 pack-year smoking history. He has never used smokeless tobacco. He reports that he does not drink alcohol or use illicit drugs.  Allergies:  Allergies  Allergen Reactions  . Vicodin (Hydrocodone-Acetaminophen) Hives and Itching     (Not in a hospital admission)  Results for orders placed during the hospital encounter of 05/28/12 (from the past 48 hour(s))  CBC WITH DIFFERENTIAL     Status: Abnormal   Collection  Time   05/28/12  6:04 PM      Component Value Range Comment   WBC 10.9 (*) 4.0 - 10.5 K/uL    RBC 4.14 (*) 4.22 - 5.81 MIL/uL    Hemoglobin 11.7 (*) 13.0 - 17.0 g/dL    HCT 91.4 (*) 78.2 - 52.0 %    MCV 89.6  78.0 - 100.0 fL    MCH 28.3  26.0 - 34.0 pg    MCHC 31.5  30.0 - 36.0 g/dL    RDW 95.6  21.3 - 08.6 %    Platelets 205  150 - 400 K/uL    Neutrophils Relative 78 (*) 43 - 77 %    Neutro Abs 8.5 (*) 1.7 - 7.7 K/uL    Lymphocytes Relative 14  12 - 46 %    Lymphs Abs 1.6  0.7 - 4.0 K/uL    Monocytes Relative 7  3 - 12 %    Monocytes Absolute 0.8  0.1 - 1.0 K/uL    Eosinophils Relative 1  0 - 5 %    Eosinophils Absolute 0.1  0.0 - 0.7 K/uL    Basophils Relative 0  0 - 1 %    Basophils Absolute 0.0  0.0 - 0.1 K/uL   COMPREHENSIVE METABOLIC PANEL     Status: Abnormal   Collection Time   05/28/12  6:04 PM      Component Value Range Comment   Sodium 140  135 - 145 mEq/L    Potassium 5.7 (*) 3.5 - 5.1 mEq/L    Chloride 102  96 - 112 mEq/L    CO2 22  19 - 32 mEq/L    Glucose, Bld 224 (*) 70 - 99 mg/dL    BUN 65 (*) 6 - 23 mg/dL    Creatinine, Ser 5.78 (*) 0.50 - 1.35 mg/dL    Calcium 9.3  8.4 - 46.9 mg/dL    Total Protein 7.7  6.0 - 8.3 g/dL    Albumin 3.5  3.5 - 5.2 g/dL    AST 12  0 - 37 U/L    ALT 9  0 - 53 U/L    Alkaline Phosphatase 226 (*) 39 - 117 U/L    Total Bilirubin 0.3  0.3 - 1.2 mg/dL    GFR calc non Af Amer 15 (*) >90 mL/min    GFR calc Af Amer 17 (*) >90 mL/min    Dg Foot 2 Views Right  05/28/2012  *RADIOLOGY REPORT*  Clinical Data: Osteomyelitis.  Cellulitis.  Wound at between the fourth and fifth toes.  RIGHT FOOT - 2 VIEW  Comparison: None.  Findings: There is no definite plain film evidence of osteomyelitis.  There is some soft tissue swelling along the fourth toe.  Dystrophic calcification is present along the lateral  aspect of the proximal phalanx.  This may represent wound packing material or Iodoform gauze.  Diabetic small vessel atherosclerotic  calcification.  Bipartite medial sesamoid.  The great toe appears within normal limits.  IMPRESSION: No convincing plain film evidence of osteomyelitis.   Original Report Authenticated By: Andreas Newport, M.D.     ROS Per HPI, otherwise Negative  Blood pressure 132/67, pulse 93, temperature 97.8 F (36.6 C), temperature source Oral, resp. rate 11, SpO2 100.00%. Physical Exam  Constitutional: He is oriented to person, place, and time. He appears well-nourished. No distress.  HENT:  Head: Normocephalic and atraumatic.  Mouth/Throat: Oropharynx is clear and moist.  Neck: Neck supple. No thyromegaly present.  Cardiovascular: Normal rate, regular rhythm and normal heart sounds.   Respiratory: Effort normal and breath sounds normal. No respiratory distress. He has no wheezes.  GI: Soft. Bowel sounds are normal. He exhibits no distension. There is no tenderness.  Musculoskeletal: He exhibits no edema.       LE pulses 1+ bilaterally   Neurological: He is alert and oriented to person, place, and time.  Skin:       Theres is maceration with open wound weeping purulent discharge between the 4th and 5th digit on the R foot.  No other wounds visualized on either foot.  He does have an area of warmth and erythema that extends to the mid foot and is tender to palpation.        Nilesh Stegall 05/29/2012, 12:09 AM

## 2012-05-29 NOTE — ED Notes (Signed)
Dr. Adela Glimpse not admitting pt. Family practice admitting pt. Bed request taken out. Family notified. Company secretary paged. Bed requested. Family notified.

## 2012-05-29 NOTE — Progress Notes (Signed)
Interval Progress Note  S:  States that he feels "ok" this morning.  Foot is less painful.  O:   R foot still with erythema extending to mid foot, still within margins drawn.  Tenderness has decreased.  Some drainage between 4th and 5th digit.    A/P  ID -WBC improved -Continue broad spectrum abx -Wound care  Renal -Some improvement in Cr with fluids -Potassium improved, continue to follow.

## 2012-05-29 NOTE — Progress Notes (Signed)
ANTIBIOTIC CONSULT NOTE - INITIAL  Pharmacy Consult for Vancocin and Zosyn Indication: diabetic foot wound  Allergies  Allergen Reactions  . Vicodin (Hydrocodone-Acetaminophen) Hives and Itching    Patient Measurements: Height: 5' 10.87" (180 cm) Weight: 186 lb (84.369 kg) IBW/kg (Calculated) : 74.99   Vital Signs: Temp: 97.8 F (36.6 C) (12/20 1759) Temp src: Oral (12/20 1759) BP: 132/67 mmHg (12/20 2005) Pulse Rate: 93  (12/20 2349)  Labs:  Basename 05/28/12 1804  WBC 10.9*  HGB 11.7*  PLT 205  LABCREA --  CREATININE 4.09*   Estimated Creatinine Clearance: 21.1 ml/min (by C-G formula based on Cr of 4.09).  Microbiology: No results found for this or any previous visit (from the past 720 hour(s)).  Medical History: Past Medical History  Diagnosis Date  . COPD (chronic obstructive pulmonary disease)   . Chronic back pain     Chronic narcotics for this for many years  . Diabetes mellitus   . Hyperlipidemia   . Hypertension   . Foot pain   . Peripheral vascular disease   . Asthma   . Stroke     No residual "Mini strokes"  . Ulcer 1985  . Fracture of foot     Compond- Right  . Chronic kidney disease     CKD stage III    Medications:  Prescriptions prior to admission  Medication Sig Dispense Refill  . albuterol (PROVENTIL HFA;VENTOLIN HFA) 108 (90 BASE) MCG/ACT inhaler Inhale 2 puffs into the lungs every 6 (six) hours as needed. For shortness of breath.      Marland Kitchen amLODipine (NORVASC) 10 MG tablet Take 10 mg by mouth daily.      Marland Kitchen aspirin 81 MG chewable tablet Chew 81 mg by mouth daily.      . baclofen (LIORESAL) 10 MG tablet Take 1 tablet (10 mg total) by mouth 2 (two) times daily.  30 each  1  . baclofen (LIORESAL) 10 MG tablet Take 1 tablet (10 mg total) by mouth 2 (two) times daily.  60 tablet  5  . Calcium Citrate-Vitamin D (CITRACAL + D PO) Take 1 tablet by mouth 2 (two) times daily.      . furosemide (LASIX) 40 MG tablet Take 40 mg by mouth daily.       . furosemide (LASIX) 40 MG tablet TAKE 1 TABLET BY MOUTH TWICE DAILY  180 tablet  0  . insulin glargine (LANTUS) 100 UNIT/ML injection Inject 30 Units into the skin every morning.      . insulin lispro (HUMALOG) 100 UNIT/ML injection Inject 5 Units into the skin 2 (two) times daily before lunch and supper. Per sliding scale.      . metoprolol succinate (TOPROL-XL) 25 MG 24 hr tablet Take 1 tablet (25 mg total) by mouth daily.  30 tablet  11  . nortriptyline (PAMELOR) 25 MG capsule Take 1 capsule (25 mg total) by mouth at bedtime.  30 capsule  3  . oxyCODONE-acetaminophen (PERCOCET) 7.5-325 MG per tablet Take 1 tablet by mouth every 6 (six) hours as needed for pain.  120 tablet  0  . pravastatin (PRAVACHOL) 40 MG tablet Take 1 tablet (40 mg total) by mouth daily.  90 tablet  4   Scheduled:    . sodium chloride   Intravenous STAT  . amLODipine  10 mg Oral Daily  . aspirin  81 mg Oral Daily  . baclofen  10 mg Oral BID  . [COMPLETED] calcium gluconate  1 g Intravenous Once  .  furosemide  40 mg Oral Daily  . heparin  5,000 Units Subcutaneous Q8H  . insulin aspart  0-15 Units Subcutaneous TID WC  . [COMPLETED] insulin aspart  10 Units Intravenous Once  . insulin glargine  20 Units Subcutaneous QHS  . metoprolol succinate  25 mg Oral Daily  . nortriptyline  10 mg Oral QHS  . [COMPLETED] piperacillin-tazobactam (ZOSYN)  IV  3.375 g Intravenous Once  . simvastatin  20 mg Oral q1800  . [COMPLETED] sodium chloride  1,000 mL Intravenous Once  . sodium polystyrene  15 g Oral Once  . [COMPLETED] vancomycin  1,000 mg Intravenous Once    Assessment: 56yo male c/o foot wound that has progressed despite following plan from PCP (dressing and epsom salt soaks), pt says there has been some purulent drainage, cellulitis extends to mid-foot with no evidence of osteo on Xray, to begin IV ABX; acute on CKD noted.  Goal of Therapy:  Vancomycin trough level 10-15 mcg/ml  Plan:  Rec'd vanc 1g and Zosyn  3.375g IV in ED; will continue with vancomycin 1g IV Q24H and Zosyn 3.375g IV Q8H and monitor CBC, Cx, CrCl, levels prn.  Colleen Can PharmD BCPS 05/29/2012,1:08 AM

## 2012-05-29 NOTE — H&P (Signed)
FMTS Attending Admission Note: John Don MD Personal pager:  [phone] FPTS Service Pager:  [phone]  I  have seen and examined this patient, reviewed their chart. I have discussed this patient with the resident. I agree with the resident's findings, assessment and care plan.  Additionally: I know [name] well from outpatient setting.  Pleasant AAM in NAD with PMH of significant peripheral arterial disease, CKD Stage IV, and poorly controlled DM2 with resultant peripheral neuropathy.  Has been treated for foot wound for past week without much improvement.  Now admitted for cellulitis. Exam: Gen:  WD, WN, NAD Heart:  RRR Lungs: Clear Abdomen:  Soft/NT Right foot:  Extensive erythema not much improved since initiation of antibiotics last night.  Soft tissue swelling has evidently improved.  Macerated wound/ulcer in webbing of 4th/5th digits and I cannot palpate to the end of the uler. Left foot:  WNL  Imp/Plan: 1.  Foot wound - Minimal improvement with IV antibiotics.   - Wound present for only about 1 - 1.5 weeks, radiographs not very sensitive for osteomyelititis during this time.   - Plan for MRI of foot for more definitive imaging.   - Continue IV antibiotics.   - Afebrile with normal white count currently  2. CKD Stage IV:   - Secondary to long standing HTN/DM2 - Eating and drinking well.  Will KVO IV fluids.   - Follow creatinine and electrolyte status  3.  Hyperkalemia: - resolved. - has been long-standing problem on outpatient, baseline is around 5.5 - 6 when doesn't take his kayexalate.

## 2012-05-30 ENCOUNTER — Observation Stay (HOSPITAL_COMMUNITY): Payer: Medicare Other

## 2012-05-30 LAB — CBC
MCH: 28.2 pg (ref 26.0–34.0)
MCH: 28.5 pg (ref 26.0–34.0)
MCHC: 31.9 g/dL (ref 30.0–36.0)
MCV: 89.4 fL (ref 78.0–100.0)
Platelets: 187 10*3/uL (ref 150–400)
Platelets: 190 10*3/uL (ref 150–400)
RBC: 3.62 MIL/uL — ABNORMAL LOW (ref 4.22–5.81)
RBC: 3.96 MIL/uL — ABNORMAL LOW (ref 4.22–5.81)
RDW: 14.1 % (ref 11.5–15.5)
RDW: 14.1 % (ref 11.5–15.5)
WBC: 9.2 10*3/uL (ref 4.0–10.5)

## 2012-05-30 LAB — BASIC METABOLIC PANEL
CO2: 18 mEq/L — ABNORMAL LOW (ref 19–32)
Calcium: 7.9 mg/dL — ABNORMAL LOW (ref 8.4–10.5)
Calcium: 8.3 mg/dL — ABNORMAL LOW (ref 8.4–10.5)
Creatinine, Ser: 3.89 mg/dL — ABNORMAL HIGH (ref 0.50–1.35)
Creatinine, Ser: 3.8 mg/dL — ABNORMAL HIGH (ref 0.50–1.35)
GFR calc Af Amer: 19 mL/min — ABNORMAL LOW (ref >90)
GFR calc non Af Amer: 16 mL/min — ABNORMAL LOW (ref >90)
GFR calc non Af Amer: 16 mL/min — ABNORMAL LOW (ref >90)
Glucose, Bld: 149 mg/dL — ABNORMAL HIGH (ref 70–99)
Sodium: 140 mEq/L (ref 135–145)
Sodium: 138 mEq/L (ref 135–145)

## 2012-05-30 LAB — GLUCOSE, CAPILLARY
Glucose-Capillary: 140 mg/dL — ABNORMAL HIGH (ref 70–99)
Glucose-Capillary: 209 mg/dL — ABNORMAL HIGH (ref 70–99)
Glucose-Capillary: 137 mg/dL — ABNORMAL HIGH (ref 70–99)

## 2012-05-30 MED ORDER — SULFAMETHOXAZOLE-TRIMETHOPRIM 400-80 MG PO TABS
1.0000 | ORAL_TABLET | Freq: Two times a day (BID) | ORAL | Status: DC
  Administered 2012-05-30 – 2012-05-31 (×3): 1 via ORAL
  Filled 2012-05-30 (×4): qty 1

## 2012-05-30 MED ORDER — SULFAMETHOXAZOLE-TMP DS 800-160 MG PO TABS: 1.0000 | ORAL_TABLET | Freq: Two times a day (BID) | ORAL | Status: DC

## 2012-05-30 NOTE — Progress Notes (Signed)
UR Completed.  Brendin Situ Jane [phone] 05/30/2012  

## 2012-05-30 NOTE — Progress Notes (Signed)
FMTS Attending Daily Note:  Renold Don MD  [phone] pager  Family Practice pager:  [phone] I have seen and examined this patient and have reviewed their chart. I have discussed this patient with the resident. I agree with the resident's findings, assessment and care plan.  Much improved examination, though still with erythema in foot.  Swelling greatly improved.  Afebrile with normal white count.  Awaiting MRI results for any signs of osteomyelitis.  If negative, will switch to PO with likely DC home tomorrow.

## 2012-05-30 NOTE — Progress Notes (Signed)
Family Practice Teaching Service  Daily Progress Note  Pager [phone]  Subjective: Feels much better, pain in foot is significantly decreased  Objective: Vital signs in last 24 hours: Temp:  [97.8 F (36.6 C)-98.7 F (37.1 C)] 98.2 F (36.8 C) (12/22 0539) Pulse Rate:  [91-96] 96  (12/22 0539) Resp:  [18-20] 18  (12/22 0539) BP: (121-157)/(60-71) 121/65 mmHg (12/22 0539) SpO2:  [94 %-98 %] 94 % (12/22 0539) Weight:  [190 lb 0.5 oz (86.198 kg)] 190 lb 0.5 oz (86.198 kg) (12/21 2020) Weight change: 4 lb 0.5 oz (1.829 kg) Last BM Date: 05/29/12  Intake/Output from previous day: 12/21 0701 - 12/22 0700 In: 1430 [P.O.:1080; IV Piggyback:350] Out: 650 [Urine:650] Intake/Output this shift:   PHYSICAL EXAM Gen: NAD, pleasant, laying in bed watching TV CV: RRR, no murmur Pulm: clear Abd: obese, soft, NT Ext: warm, 1+ pedal pulses bilaterally, R foot wrapped loosely, erythema well within marked boarders, minimal tenderness, little to no warmth of dorsum of foot, small amount of draining from wound between 4th and 5th toes on R  Lab Results:  [facility] 05/30/12 0710 05/29/12 0640  WBC 9.2 9.5  HGB 10.2* 10.6*  HCT 32.3* 33.8*  PLT 187 193   BMET  Basename 05/29/12 0640 05/28/12 1804  NA 139 140  K 5.1 5.7*  CL 105 102  CO2 19 22  GLUCOSE 211* 224*  BUN 65* 65*  CREATININE 3.80* 4.09*  CALCIUM 8.5 9.3    Studies/Results: Dg Foot 2 Views Right  05/28/2012  *RADIOLOGY REPORT*  Clinical Data: Osteomyelitis.  Cellulitis.  Wound at between the fourth and fifth toes.  RIGHT FOOT - 2 VIEW  Comparison: None.  Findings: There is no definite plain film evidence of osteomyelitis.  There is some soft tissue swelling along the fourth toe.  Dystrophic calcification is present along the lateral aspect of the proximal phalanx.  This may represent wound packing material or Iodoform gauze.  Diabetic small vessel atherosclerotic calcification.  Bipartite medial sesamoid.  The great toe  appears within normal limits.  IMPRESSION: No convincing plain film evidence of osteomyelitis.   Original Report Authenticated By: Andreas Newport, M.D.     Medications: I have reviewed the patient's current medications.  Assessment/Plan: 56 yo male with multiple medical problems most notably DM with peripheral neuropathy, HTN, CKD (baseline ~2.5-3) here with complaint of foot cellulitis and wound    ID  #Foot wound with associated cellulitis: Was worsening since clinic on 12/11. No fluctuance or evidence for abscess. X-ray with no evidence of osteomyelitis. Initial cellulitis extended to mid foot.  - Wound care consulted for dressings.  - Improvement with IV vanc/zosyn.  - Plan for MRI of foot for more definitive imaging.  - Continue IV antibiotics until MRI, if negative for osteo, could consider transitioning to po antibiotics today and monitor for continued improvement overnight tonight.  - Continues to be afebrile with normal white count  - Home percocet for pain management.   Endo  #DM: Previous A1c of 9.0 indicates inadequate control of his diabetes and he does have complications stemming from this including CKD and neuropathy. - Continue lantus at a slightly decreased dose of 20U (can titrate this back up to home dose of 30U if needed)  - SSI for correction coverage.   CV  #HTN: Normotensive currently - Continue home antihypertensives (norvasc 10, lasix 40, toprol XL 25)   #Hyperkalemia: EKG with no peaked T waves. Insulin and calcium gluconate given in the ED for  K of 5.7. This appears to be a chronic problem likely related to his CKD.  - BMET this AM shows normal K of 4.8, will continue to monitor - Continue daily Kayexalate (is supposed to be on this at home)   Renal  #Acute on chronic renal failure: Baseline Cr appears to be around 2.5-3, at 4.09 on admit. Could be prerenal although he does not appear very dry. Will hydrate gently to see if this improves. If not improving,  can consider involving renal team. He last was seen by Dr. Kathrene Bongo in October.  - Secondary to long standing HTN/DM2  - Eating and drinking well. KVO'ed IV fluids.  - Follow creatinine and electrolyte status --> currently Cr has improved to 3.80  Pulm  #COPD: Stable, continue home inhalers   FEN/GI  -Carb modified diet  -See assessment above for hyperkalemia  -KVO IVF  PPX  -SQ Heparin   Dispo  Pending clinical improvement     LOS: 2 days   John [name] 05/30/2012, 8:39 AM

## 2012-05-31 LAB — BASIC METABOLIC PANEL
BUN: 63 mg/dL — ABNORMAL HIGH (ref 6–23)
CO2: 20 mEq/L (ref 19–32)
Calcium: 7.9 mg/dL — ABNORMAL LOW (ref 8.4–10.5)
GFR calc non Af Amer: 16 mL/min — ABNORMAL LOW (ref >90)
Glucose, Bld: 101 mg/dL — ABNORMAL HIGH (ref 70–99)

## 2012-05-31 LAB — CBC
HCT: 32.3 % — ABNORMAL LOW (ref 39.0–52.0)
MCH: 28.6 pg (ref 26.0–34.0)
MCHC: 32.5 g/dL (ref 30.0–36.0)
MCV: 88 fL (ref 78.0–100.0)
Platelets: 210 10*3/uL (ref 150–400)
RDW: 14.2 % (ref 11.5–15.5)

## 2012-05-31 LAB — GLUCOSE, CAPILLARY: Glucose-Capillary: 87 mg/dL (ref 70–99)

## 2012-05-31 MED ORDER — SULFAMETHOXAZOLE-TRIMETHOPRIM 400-80 MG PO TABS: 1.0000 | ORAL_TABLET | Freq: Two times a day (BID) | ORAL | Status: DC

## 2012-05-31 MED ORDER — KETOCONAZOLE 2 % EX CREA: TOPICAL_CREAM | Freq: Every day | CUTANEOUS | Status: DC

## 2012-05-31 MED ORDER — INSULIN GLARGINE 100 UNIT/ML ~~LOC~~ SOLN: 20.0000 [IU] | Freq: Every morning | SUBCUTANEOUS | Status: DC

## 2012-05-31 NOTE — Progress Notes (Signed)
Patient was discharged with discharge instructions and prescriptions. Patient was discharged home with family. Patient was told to call MD with questions and concerns especially regarding changes in foot. Patient was told to go to the hospital in case of an emergency. Patient was stable upon discharge.

## 2012-05-31 NOTE — Progress Notes (Signed)
Seen and examined.  Discussed with Dr. Fara Boros.  Agree with her management.  Ok to DC today.  Cellulitis is clearly improving.  I am concerned that he has symptoms of worsening claudication and has known PVD.  This can be followed as an outpatient by Dr. Durwin Nora.  Likely needs further evaluation.

## 2012-05-31 NOTE — Progress Notes (Signed)
Physical Therapy Evaluation Patient Details Name: John [name] MRN: [id number] DOB: 08/07/1955 Today's Date: 05/31/2012 Time: 4098-1191 PT Time Calculation (min): 32 min  PT Assessment / Plan / Recommendation Clinical Impression  Pt is 56 yo male with right foot cellulitis as well as altered gait pattern and bilateral LE weakness that seems likely due to low back issues. Recommend HHPT at d/c for strengthening and balance program but pt does not need further acute PT. PT signing off.    PT Assessment  All further PT needs can be met in the next venue of care    Follow Up Recommendations  Home health PT;Supervision - Intermittent    Does the patient have the potential to tolerate intense rehabilitation      Barriers to Discharge        Equipment Recommendations  None recommended by PT    Recommendations for Other Services     Frequency      Precautions / Restrictions Precautions Precautions: Fall Precaution Comments: altered gait pattern Restrictions Weight Bearing Restrictions: No   Pertinent Vitals/Pain 7/10 right foot pain, premedicated      Mobility  Bed Mobility Bed Mobility: Not assessed (pt up EOB, has been performing bed mobility independently) Transfers Transfers: Sit to Stand;Stand to Sit Sit to Stand: 6: Modified independent (Device/Increase time);From bed;With upper extremity assist Stand to Sit: 6: Modified independent (Device/Increase time);To bed;With upper extremity assist Details for Transfer Assistance: pt waits upon standing to get his balance and make sure that he is not dizzy, reports that he has used this strategy for years since a back injury in 2007. No physical assist needed Ambulation/Gait Ambulation/Gait Assistance: 6: Modified independent (Device/Increase time);4: Min guard Ambulation Distance (Feet): 200 Feet Assistive device: Rolling walker Ambulation/Gait Assistance Details: began with min-guard assist but once pt had been up a few  minutes he was able to ambulate without assist, abnormal gait pattern with narrow BOS, bilateral internal rotation of hips and today dragging left foot though he reports that he frequently drags the right. Discussed fall risk and pt agrees that he has the potential for falls due to altered gait pattern. Normally uses his cane but agreed to use RW while his foot heals. Foot pain increazed with distance and pt began to shuffle both feet.  Gait Pattern: Step-through pattern;Decreased stride length;Narrow base of support Gait velocity: decraesed Stairs: No Wheelchair Mobility Wheelchair Mobility: No    Shoulder Instructions     Exercises     PT Diagnosis: Abnormality of gait;Acute pain  PT Problem List: Decreased balance;Decreased strength;Decreased activity tolerance;Pain PT Treatment Interventions:     PT Goals    Visit Information  Last PT Received On: 05/31/12 Assistance Needed: +1    Subjective Data  Subjective: pt vomiting upon arrival Patient Stated Goal: return home   Prior Functioning  Home Living Lives With: Spouse Available Help at Discharge: Family;Available 24 hours/day Type of Home: House Home Access: Level entry Home Layout: One level Bathroom Shower/Tub: Tub/shower unit Home Adaptive Equipment: Shower chair with back;Walker - rolling;Straight cane Prior Function Level of Independence: Independent with assistive device(s) Able to Take Stairs?: Yes Driving: Yes Vocation: On disability Communication Communication: No difficulties    Cognition  Overall Cognitive Status: Appears within functional limits for tasks assessed/performed Arousal/Alertness: Awake/alert Orientation Level: Oriented X4 / Intact Behavior During Session: [facility] for tasks performed    Extremity/Trunk Assessment Right Upper Extremity Assessment RUE ROM/Strength/Tone: [facility] for tasks assessed Left Upper Extremity Assessment LUE ROM/Strength/Tone: [facility] for tasks  assessed Right Lower Extremity  Assessment RLE ROM/Strength/Tone: Deficits RLE ROM/Strength/Tone Deficits: hip flex 4/5, knee flex/ ext 4/5, hip abd/ add 4/5, generalized weakness of bilateral LE's noted and pt ambulates with pattern indicative of weakness of external rotators RLE Sensation: Deficits RLE Sensation Deficits: pt reports intermittent numbness and tingling of bilateral LE's RLE Coordination: WFL - gross motor Left Lower Extremity Assessment LLE ROM/Strength/Tone: Deficits LLE ROM/Strength/Tone Deficits: grossly 4/5 throughout, same assessment as RLE though pt reports that subjectively, the left leg does not bother him as much as the right LLE Sensation: Deficits LLE Sensation Deficits: same as RLE LLE Coordination: WFL - gross motor Trunk Assessment Trunk Assessment: Other exceptions Trunk Exceptions: in standing, pt with trunk flexion   Balance Balance Balance Assessed: Yes Dynamic Standing Balance Dynamic Standing - Balance Support: No upper extremity supported;During functional activity Dynamic Standing - Level of Assistance: 5: Stand by assistance  End of Session PT - End of Session Equipment Utilized During Treatment: Gait belt Activity Tolerance: Patient tolerated treatment well;Patient limited by pain Patient left: in bed;with call bell/phone within reach Nurse Communication: Mobility status  GP   Lyanne Co, PT  Acute Rehab Services  [phone]   [place], [place] 05/31/2012, 1:20 PM

## 2012-05-31 NOTE — Discharge Summary (Signed)
Physician Discharge Summary  Patient ID: John [name] MRN: [id number] DOB/AGE: May 24, 1956 56 y.o.  Admit date: 05/28/2012 Discharge date: 05/31/2012  Admission Diagnoses: R foot wound  Discharge Diagnoses:  Principal Problem:  *Cellulitis of right foot Active Problems:  NEUROPATHY, DIABETIC  DIABETES MELLITUS, II, COMPLICATIONS  HYPERTENSION, BENIGN SYSTEMIC  CHRONIC KIDNEY DISEASE STAGE III (MODERATE)  HYPERKALEMIA  Hyperkalemia   Discharged Condition: stable  Hospital Course: 56 yo male with multiple medical problems most notably DM with peripheral neuropathy, HTN, CKD admitted with complaint of foot wound.  Initially started on IV vanc/zosyn. Xray and MRI were negative for osteo and he was transitioned to po Septra on 12/22.  Foot and pain remained stable to improved on po antibiotics.  Patient was afebrile throughout hospitalization.  HTN: BPs remained stable on home regimen of norvasc 10, lasix 40, toprol XL 25. No changes made.  DM: Used decreased dose of Lantus while in house- 20 units, with good CBG control on diabetic diet; will likely need to increase back to home dose of 30 units when out of hospital and eating his regular diet. A1c was 9.0  AoCKD: Baseline Cr 2.5-3; was 4.09 on admission, improved to 3.83 at discharge   PVD: He has a history of PVD and is being followed by Dr. Durwin Nora. He reports worsening lower extremity symptoms. Discharge summary will be sent to Dr. Durwin Nora, and he was advised to follow-up regarding this issue.   Consults: None   Disposition: 01-Home or Self Care  Discharge Orders    Future Appointments: Provider: Department: Dept Phone: Center:   06/16/2012 10:15 AM Nestor Ramp, MD [facility] [phone] [facility]   07/14/2012 10:00 AM [facility] [phone] [facility]   07/14/2012 11:00 AM [facility] [phone] [facility]   07/14/2012 11:40 AM Evern Bio, NP [facility] [phone] [facility]       Medication List     As of 05/31/2012  8:59 AM    TAKE these medications         albuterol 108 (90 BASE) MCG/ACT inhaler   Commonly known as: PROVENTIL HFA;VENTOLIN HFA   Inhale 2 puffs into the lungs every 6 (six) hours as needed. For shortness of breath.      amLODipine 10 MG tablet   Commonly known as: NORVASC   Take 10 mg by mouth daily.      aspirin 81 MG chewable tablet   Chew 81 mg by mouth daily.      baclofen 10 MG tablet   Commonly known as: LIORESAL   Take 1 tablet (10 mg total) by mouth 2 (two) times daily.      baclofen 10 MG tablet   Commonly known as: LIORESAL   Take 1 tablet (10 mg total) by mouth 2 (two) times daily.      CITRACAL + D PO   Take 1 tablet by mouth 2 (two) times daily.      furosemide 40 MG tablet   Commonly known as: LASIX   Take 40 mg by mouth daily.      insulin glargine 100 UNIT/ML injection   Commonly known as: LANTUS   Inject 20 Units into the skin every morning.      insulin lispro 100 UNIT/ML injection   Commonly known as: HUMALOG   Inject 5 Units into the skin 2 (two) times daily before lunch and supper. Per sliding  scale.      metoprolol succinate 25 MG 24 hr tablet   Commonly known as: TOPROL-XL   Take 1 tablet (25 mg total) by mouth daily.      nortriptyline 25 MG capsule   Commonly known as: PAMELOR   Take 1 capsule (25 mg total) by mouth at bedtime.      oxyCODONE-acetaminophen 7.5-325 MG per tablet   Commonly known as: PERCOCET   Take 1 tablet by mouth every 6 (six) hours as needed for pain.      pravastatin 40 MG tablet   Commonly known as: PRAVACHOL   Take 1 tablet (40 mg total) by mouth daily.      sulfamethoxazole-trimethoprim 400-80 MG per tablet   Commonly known as: BACTRIM,SEPTRA   Take 1 tablet by mouth every 12 (twelve) hours.       FOLLOW UP ISSUES: 1. Make sure tolerating po Septra 2. Recheck foot wound 3. Increase  lantus back to home 30 units if CBGs are high on 20 units  4. Recheck BMET (Cr and K) --> should be taking kayexalate at home  5. Confirm he has follow-up with Dr. Durwin Nora for worsening lower extremity pain in setting of history of PVD   Signed: MCGILL,JACQUELYN 05/31/2012, 8:59 AM

## 2012-05-31 NOTE — Progress Notes (Signed)
CM referral for home health PT Patient selected [facility]  [facility] called with referral for PT.

## 2012-05-31 NOTE — Discharge Summary (Signed)
Seen earlier today.  Agree with DC as outlined by Dr. Madolyn Frieze

## 2012-05-31 NOTE — Progress Notes (Signed)
Family Practice Teaching Service  Daily Progress Note  Pager [phone]  Subjective: Feels like his foot is doing much better.  Very little pain.  Did get 3 doses of percocet yesterday, last dose at 2200 (home medicine). No fevers.   Objective: Vital signs in last 24 hours: Temp:  [98.1 F (36.7 C)-98.5 F (36.9 C)] 98.3 F (36.8 C) (12/23 0515) Pulse Rate:  [81-91] 89  (12/23 0515) Resp:  [18] 18  (12/23 0515) BP: (128-151)/(64-80) 147/64 mmHg (12/23 0515) SpO2:  [95 %-99 %] 96 % (12/23 0515) Weight:  [190 lb 0.5 oz (86.198 kg)] 190 lb 0.5 oz (86.198 kg) (12/22 2013) Weight change: 0 lb (0 kg) Last BM Date: 05/30/12  Intake/Output from previous day: 12/22 0701 - 12/23 0700 In: 840 [P.O.:840] Out: 500 [Urine:500] Intake/Output this shift:   PHYSICAL EXAM Gen: NAD, pleasant, getting ready to eat breakfast CV: RRR, no murmur Pulm: clear Abd: obese, soft, NT Ext: warm, 1+ pedal pulses bilaterally, R foot wrapped loosely, erythema well within marked boarders, minimal tenderness, still with some warmth of dorsum of foot, small amount of draining from wound between 4th and 5th toes on R  Lab Results:  [facility] 05/31/12 0455 05/30/12 0823  WBC 9.8 10.3  HGB 10.5* 11.3*  HCT 32.3* 35.4*  PLT 210 190   BMET  Basename 05/31/12 0455 05/30/12 0823  NA 137 138  K 4.4 4.8  CL 104 106  CO2 20 18*  GLUCOSE 101* 163*  BUN 63* 63*  CREATININE 3.83* 3.80*  CALCIUM 7.9* 8.3*    Studies/Results: Mr Foot Right Wo Contrast  05/30/2012  *RADIOLOGY REPORT*  Clinical Data: Cellulitis.  Foot wound.  Diabetic with peripheral arterial disease.  Osteomyelitis.  MRI OF THE RIGHT FOREFOOT WITHOUT CONTRAST  Technique:  Multiplanar, multisequence MR imaging was performed. No intravenous contrast was administered.  Comparison: Radiographs 05/28/2012.MRI 10/31/2011  Findings: There is a wound in the fourth webspace and bone marrow edema in the proximal phalanx of the fourth toe.  There is no  cortical osteolysis.  The ulceration does not appear to extend to the cortical surface.  Bone marrow edema in the proximal phalanx is most compatible with reactive marrow edema from the nearby ulceration.  No convincing evidence of osteomyelitis.  Mild splaying of the fourth and fifth toes, presumably secondary to packing material.  Diffuse edema is present in the subcutaneous tissues of the forefoot, likely representing either dependent edema or cellulitis in the appropriate clinical setting. Plantar foot musculature diabetic myopathy.  Compared to MRI 10/31/2011, the low-level marrow edema in the proximal phalanx of the fourth toe appears similar.  Edema over the dorsum of the fourth toe is also noted.  IMPRESSION: Fourth webspace ulceration without osteomyelitis marrow edema in the fourth proximal phalanx is most compatible with reactive edema from adjacent wound.  Diffuse forefoot soft tissue edema likely representing cellulitis in the appropriate clinical setting.   Original Report Authenticated By: Andreas Newport, M.D.     Medications: I have reviewed the patient's current medications.  Assessment/Plan: 56 yo male with multiple medical problems most notably DM with peripheral neuropathy, HTN, CKD (baseline ~2.5-3) here with complaint of foot cellulitis and wound    ID  #Foot wound with associated cellulitis: Was worsening since clinic on 12/11. No fluctuance or evidence for abscess. X-ray with no evidence of osteomyelitis. Initial cellulitis extended to mid foot.  - Wound care consulted for dressings.  - Improvement with IV vanc/zosyn --> now on po Septra due  to osteo being ruled out  - MRI without signs of osteomyelitis on 12/22 - Continues to be afebrile with normal white count  - Home percocet for pain management.  - Would continue antibiotics for total of a 10 day course, last dose will be on 12/30  Endo  #DM: Previous A1c of 9.0 indicates inadequate control of his diabetes and he does  have complications stemming from this including CKD and neuropathy. - Continue lantus at a slightly decreased dose of 20U (can titrate this back up to home dose of 30U if needed as outpatient once no longer on controlled diabetic diet)  - SSI for correction coverage.  - CBGs well controlled, highest in past 24 hours 209, lowest 87   CV  #HTN: Normotensive currently - Continue home antihypertensives (norvasc 10, lasix 40, toprol XL 25)   #Hyperkalemia: EKG with no peaked T waves. Insulin and calcium gluconate given in the ED for K of 5.7. This appears to be a chronic problem likely related to his CKD.  - BMET this AM shows normal K of 4.4, will continue to monitor - Continue daily Kayexalate (is supposed to be on this at home)   Renal  #Acute on chronic renal failure: Baseline Cr appears to be around 2.5-3, at 4.09 on admit. Could have been prerenal although he did not appear very dry. Hydrated gently with minimal improvement. If not improving, can consider involving renal team. He last was seen by Dr. Kathrene Bongo in October.  - Secondary to long standing HTN/DM2  - Eating and drinking well. KVO'ed IV fluids.  - Follow creatinine and electrolyte status --> currently Cr is stable at 3.83  Pulm  #COPD: Stable, continue home inhalers   FEN/GI  -Carb modified diet  -See assessment above for hyperkalemia  -KVO IVF  PPX  -SQ Heparin   Dispo  Likely d/c today after evaluation by PT to ensure there are no home health needs to help with ambulation given his foot infection  Will need total of 7 additional days of antibiotics at d/c     LOS: 3 days   John [name] 05/31/2012, 7:11 AM

## 2012-05-31 NOTE — Consult Note (Signed)
WOC consult Note Reason for Consult: Consult requested for right foot wound.  Pt previously had cellulitis which is receeding from previous marking to right foot.   Wound type: Full thickness wound to inner space between 4th and 5th toes. Measurement: .2X.2X.2cm Wound bed: Difficult to visualize.  Appears to be dark red Drainage (amount, consistency, odor) Mod red drainage, no odor.   Periwound: Skin beginning to peel to anterior foot. Dressing procedure/placement/frequency: Pt preparing to D/C home.  Given 2 sheets of Aquacel and instructed on daily use by tucking into wound bed.  Pt states he is able to perform dressing changes without assistance and will return to his primary team if wound declines. Instructed to discontinue use of Aquacel when would is dry and healed. He denies further questions regarding plan of care. Will not plan to follow further unless re-consulted.  [address] RN, MSN, [employer]  [phone]

## 2012-06-03 ENCOUNTER — Other Ambulatory Visit: Payer: Self-pay | Admitting: Family Medicine

## 2012-06-04 ENCOUNTER — Ambulatory Visit (INDEPENDENT_AMBULATORY_CARE_PROVIDER_SITE_OTHER): Payer: Medicare Other | Admitting: Family Medicine

## 2012-06-04 ENCOUNTER — Encounter: Payer: Self-pay | Admitting: Family Medicine

## 2012-06-04 VITALS — BP 159/78 | HR 99 | Temp 99.0°F | Ht 70.0 in | Wt 192.0 lb

## 2012-06-04 DIAGNOSIS — E1165 Type 2 diabetes mellitus with hyperglycemia: Secondary | ICD-10-CM

## 2012-06-04 DIAGNOSIS — I10 Essential (primary) hypertension: Secondary | ICD-10-CM

## 2012-06-04 DIAGNOSIS — E118 Type 2 diabetes mellitus with unspecified complications: Secondary | ICD-10-CM

## 2012-06-04 DIAGNOSIS — L03115 Cellulitis of right lower limb: Secondary | ICD-10-CM

## 2012-06-04 DIAGNOSIS — L02619 Cutaneous abscess of unspecified foot: Secondary | ICD-10-CM

## 2012-06-04 NOTE — Assessment & Plan Note (Signed)
Pt increased his lantus back to 30U at d/c, so no need to increase today.

## 2012-06-04 NOTE — Assessment & Plan Note (Signed)
Significantly improved from hospital admit on po septra.  Will continue antibiotics- tolerating well.  Red flags for return discussed.

## 2012-06-04 NOTE — Patient Instructions (Addendum)
It was nice to see you today.  I'm glad you were able to get home for Christmas!  No changes to any of your medicines.  Keep taking your antibiotic until it is gone and use that cream until the skin is healed up.  You have an appointment with Dr. [name] on 06/16/12.  Call or come back if you feel like the pain in your toes is getting worse, you start having fevers, the redness is spreading, or if it starts draining pus.

## 2012-06-04 NOTE — Assessment & Plan Note (Signed)
No changes to med regimen while in house, continue.

## 2012-06-04 NOTE — Progress Notes (Signed)
S: Pt comes in today for hospital follow up.  56 yo male with multiple medical problems most notably DM with peripheral neuropathy, HTN, CKD admitted from 12/20-12/23 with complaint of foot wound. Initially started on IV vanc/zosyn. Xray and MRI were negative for osteo and he was transitioned to po Septra on 12/22.  Since d/c home, patient reports foot feels 100%.  Is walking ok.  Denies any fevers.  Still oozing a little but overall much better.  Redness and swelling feel like they are better.  Is using his walker.  Is putting the ketoconazole on every other day.  Is keeping it wrapped.    Plans to call Dr. Durwin Nora when he gets home today.   Of note, patient's home lantus was decreased from 30 to 20 units while in house with relatively well controlled sugars.  At home, his CBGs have been running 130's, highest was 200.  He reports he is taking 30 units at home.      ROS: Per HPI  History  Smoking status  . Current Every Day Smoker -- 0.5 packs/day for 40 years  . Types: Cigarettes  Smokeless tobacco  . Never Used    Comment: pt states that he is trying to quit    O:  Filed Vitals:   06/04/12 0839  BP: 159/78  Pulse: 99  Temp: 99 F (37.2 C)    Gen: NAD CV: RRR, no murmur Pulm: CTA bilat, no wheezes or crackles Ext: Warm, no edema; R foot with healing wound between 4th and 5th toes with minimal warmth and significantly decreased erythema from hospitalization    A/P: 56 y.o. male p/w HFU for foot wound/cellulitis -See problem list -f/u in 2 weeks with PCP

## 2012-06-11 ENCOUNTER — Telehealth: Payer: Self-pay | Admitting: Family Medicine

## 2012-06-11 ENCOUNTER — Emergency Department (INDEPENDENT_AMBULATORY_CARE_PROVIDER_SITE_OTHER)
Admission: EM | Admit: 2012-06-11 | Discharge: 2012-06-11 | Disposition: A | Discharge status: Home / Self Care | Payer: Medicare Other | Source: Home / Self Care

## 2012-06-11 ENCOUNTER — Encounter (HOSPITAL_COMMUNITY): Payer: Self-pay | Admitting: Emergency Medicine

## 2012-06-11 DIAGNOSIS — I739 Peripheral vascular disease, unspecified: Secondary | ICD-10-CM

## 2012-06-11 DIAGNOSIS — L908 Other atrophic disorders of skin: Secondary | ICD-10-CM

## 2012-06-11 DIAGNOSIS — G629 Polyneuropathy, unspecified: Secondary | ICD-10-CM

## 2012-06-11 DIAGNOSIS — G609 Hereditary and idiopathic neuropathy, unspecified: Secondary | ICD-10-CM

## 2012-06-11 DIAGNOSIS — E119 Type 2 diabetes mellitus without complications: Secondary | ICD-10-CM

## 2012-06-11 MED ORDER — SULFAMETHOXAZOLE-TRIMETHOPRIM 800-160 MG PO TABS: ORAL_TABLET | ORAL | Status: DC

## 2012-06-11 NOTE — ED Provider Notes (Signed)
History     CSN: [id number]  Arrival date & time 06/11/12  1710   None     Chief Complaint  Patient presents with  . Foot Pain    (Consider location/radiation/quality/duration/timing/severity/associated sxs/prior treatment) HPI Comments: 57 year old male with a history of diabetes mellitus, peripheral neuropathy and peripheral vascular disease was admitted to the hospital couple weeks ago for infection to the right distal foot and toes. He received IV antibiotics and discharge her by mouth antibiotics. He was given instructions for dressing changes in medications to apply to the foot. His wife/caretaker has been following directions diligently and the foot has improved. He is also finished the bottle of Septra. The concern for today he was a 2 mm "crack" of epidermis between the fourth and fifth digits. This is not an area of infection but she states this is how initial infection began. There is no surrounding erythema or swelling associated with this lesion. The patient and significant other was concerned that caused this was what the original lesion looked like in the major infection followed.   Past Medical History  Diagnosis Date  . COPD (chronic obstructive pulmonary disease)   . Chronic back pain     Chronic narcotics for this for many years  . Diabetes mellitus   . Hyperlipidemia   . Hypertension   . Foot pain   . Peripheral vascular disease   . Asthma   . Stroke     No residual "Mini strokes"  . Ulcer 1985  . Fracture of foot     Compond- Right  . Chronic kidney disease     CKD stage III    Past Surgical History  Procedure Date  . Neck surgery   . Eye surgery   . Cardiac catheterization   . Endarterectomy 12/18/2011    Procedure: ENDARTERECTOMY CAROTID;  Surgeon: Chuck Hint, MD;  Location: [facility] OR;  Service: Vascular;  Laterality: Left;  . Carotid endarterectomy     Family History  Problem Relation Age of Onset  . Diabetes Mother   . Heart disease  Mother   . Hypertension Mother   . Heart attack Mother   . Diabetes Father   . Heart disease Father   . Hypertension Father   . Heart attack Father   . Other Father     bleeding problems  . Diabetes Brother   . Heart disease Brother     History  Substance Use Topics  . Smoking status: Current Every Day Smoker -- 0.5 packs/day for 40 years    Types: Cigarettes  . Smokeless tobacco: Never Used     Comment: pt states that he is trying to quit  . Alcohol Use: No     Comment: Previous drinker - However, quit in 2006      Review of Systems  Constitutional: Negative for fever, diaphoresis and activity change.  Cardiovascular: Negative.   Gastrointestinal: Negative.   Genitourinary: Negative.   Neurological: Negative.   All other systems reviewed and are negative.    Allergies  Vicodin  Home Medications   Current Outpatient Rx  Name  Route  Sig  Dispense  Refill  . AMLODIPINE BESYLATE 10 MG PO TABS   Oral   Take 10 mg by mouth daily.         . FUROSEMIDE 40 MG PO TABS   Oral   Take 40 mg by mouth daily.         . INSULIN GLARGINE 100 UNIT/ML [place] SOLN  Subcutaneous   Inject 20 Units into the skin every morning.   10 mL   0   . METOPROLOL SUCCINATE ER 25 MG PO TB24   Oral   Take 1 tablet (25 mg total) by mouth daily.   30 tablet   11   . OXYCODONE-ACETAMINOPHEN 7.5-325 MG PO TABS   Oral   Take 1 tablet by mouth every 6 (six) hours as needed for pain.   120 tablet   0   . SULFAMETHOXAZOLE-TRIMETHOPRIM 400-80 MG PO TABS   Oral   Take 1 tablet by mouth every 12 (twelve) hours.   14 tablet   0   . ALBUTEROL SULFATE HFA 108 (90 BASE) MCG/ACT IN AERS   Inhalation   Inhale 2 puffs into the lungs every 6 (six) hours as needed. For shortness of breath.         . AMLODIPINE BESYLATE 10 MG PO TABS      TAKE 1 TABLET BY MOUTH DAILY   90 tablet   3   . ASPIRIN 81 MG PO CHEW   Oral   Chew 81 mg by mouth daily.         Marland Kitchen BACLOFEN 10 MG PO TABS    Oral   Take 1 tablet (10 mg total) by mouth 2 (two) times daily.   30 each   1   . BACLOFEN 10 MG PO TABS   Oral   Take 1 tablet (10 mg total) by mouth 2 (two) times daily.   60 tablet   5   . CITRACAL + D PO   Oral   Take 1 tablet by mouth 2 (two) times daily.         . INSULIN LISPRO (HUMAN) 100 UNIT/ML [place] SOLN   Subcutaneous   Inject 5 Units into the skin 2 (two) times daily before lunch and supper. Per sliding scale.         Marland Kitchen KETOCONAZOLE 2 % EX CREA   Topical   Apply topically daily. To feet   15 g   0   . NORTRIPTYLINE HCL 25 MG PO CAPS   Oral   Take 1 capsule (25 mg total) by mouth at bedtime.   30 capsule   3   . PRAVASTATIN SODIUM 40 MG PO TABS   Oral   Take 1 tablet (40 mg total) by mouth daily.   90 tablet   4   . SULFAMETHOXAZOLE-TRIMETHOPRIM 800-160 MG PO TABS      1 bid for 5 days   10 tablet   0     BP 117/60  Pulse 93  Temp 98.2 F (36.8 C) (Oral)  Resp 20  SpO2 99%  Physical Exam  Nursing note and vitals reviewed. Constitutional: He is oriented to person, place, and time. He appears well-developed and well-nourished. No distress.  Neck: Normal range of motion. Neck supple.  Pulmonary/Chest: Effort normal.  Musculoskeletal: Normal range of motion.  Neurological: He is alert and oriented to person, place, and time.  Skin: Skin is warm and dry.       Right foot examination reveals black eschar on the fourth digit due to the prior infection. There is healing wound between the fourth and fifth toes however in need 2-3 mm disruption of the epidermis was noticed today. There is no edema to the foot or ankle. There is slight erythema along the lateral aspect of the toes and forefoot. No lymphangitis. Is no draining from this the  wound.  Psychiatric: He has a normal mood and affect.    ED Course  Procedures (including critical care time)  Labs Reviewed - No data to display No results found.   1. Skin breakdown   2. Diabetes mellitus    3. Peripheral neuropathy   4. Peripheral vascular disease       MDM  We will add 5 more days of Septra twice a day. I suspect this small disruption of the epidermis between the toes due to prolonged moisture to the wound. This is caused by the application of the antibiotic cream. I suspect that allowing a few hours of "trying to" without cream may allow this to scab over. Daily dressing to the foot as previously prescribed except now allowing time between dressing changes to let it dry out. Allow the wound to be exposed to air so it may scab and heal together. I applied a quarter-inch Nu Gauze to the 3 mm wound between the toes for now. Call your doctor for appointment for followup typically if there are new symptoms such as swelling, redness worsening pain or other problems.         Hayden Rasmussen, NP 06/11/12 2017

## 2012-06-11 NOTE — Telephone Encounter (Signed)
Pt states that he has another spot coming up on his foot - he just got out of hosp for cellulitis and wants to know if he should wait until the 8th to see Dr [name]

## 2012-06-11 NOTE — ED Notes (Signed)
Pt c/o right foot pain.  States he was admitted to the [facility] for cellulitis on 05/28/12 for x3 days.  Today reports the same problem and has noticed a crack/lesion between the 4th and 5th toe of right foot.  Sx include: painful w/activity and diarrhea.  Denies: fevers, vomiting, nauseas, diarrhea. He is alert w/no signs of acute distress.

## 2012-06-11 NOTE — ED Provider Notes (Signed)
Medical screening examination/treatment/procedure(s) were performed by non-physician practitioner and as supervising physician I was immediately available for consultation/collaboration.  Raynald Blend, MD 06/11/12 2034

## 2012-06-11 NOTE — Telephone Encounter (Signed)
Spoke with patient and he states his wife just saw an opened area, a hole, under little toe on same foot he  had cellulitis.  Area is tender and red. Consulted with Dr. Gwendolyn Grant and he advised to go to Urgent Care to be evaluated today. Patient voices understanding.

## 2012-06-15 ENCOUNTER — Emergency Department (HOSPITAL_COMMUNITY): Payer: Medicare Other

## 2012-06-15 ENCOUNTER — Inpatient Hospital Stay (HOSPITAL_COMMUNITY)
Admission: EM | Admit: 2012-06-15 | Discharge: 2012-06-24 | DRG: 253 | Disposition: A | Discharge status: Skilled Nursing Facility | Payer: Medicare Other | Attending: Family Medicine | Admitting: Family Medicine

## 2012-06-15 ENCOUNTER — Encounter (HOSPITAL_COMMUNITY): Payer: Self-pay | Admitting: Emergency Medicine

## 2012-06-15 ENCOUNTER — Telehealth: Payer: Self-pay | Admitting: *Deleted

## 2012-06-15 DIAGNOSIS — I10 Essential (primary) hypertension: Secondary | ICD-10-CM

## 2012-06-15 DIAGNOSIS — Z8679 Personal history of other diseases of the circulatory system: Secondary | ICD-10-CM

## 2012-06-15 DIAGNOSIS — R338 Other retention of urine: Secondary | ICD-10-CM | POA: Diagnosis present

## 2012-06-15 DIAGNOSIS — N183 Chronic kidney disease, stage 3 unspecified: Secondary | ICD-10-CM

## 2012-06-15 DIAGNOSIS — L03115 Cellulitis of right lower limb: Secondary | ICD-10-CM

## 2012-06-15 DIAGNOSIS — E872 Acidosis, unspecified: Secondary | ICD-10-CM | POA: Diagnosis not present

## 2012-06-15 DIAGNOSIS — Z7982 Long term (current) use of aspirin: Secondary | ICD-10-CM

## 2012-06-15 DIAGNOSIS — J4489 Other specified chronic obstructive pulmonary disease: Secondary | ICD-10-CM | POA: Diagnosis present

## 2012-06-15 DIAGNOSIS — F17200 Nicotine dependence, unspecified, uncomplicated: Secondary | ICD-10-CM

## 2012-06-15 DIAGNOSIS — E118 Type 2 diabetes mellitus with unspecified complications: Secondary | ICD-10-CM

## 2012-06-15 DIAGNOSIS — Z8249 Family history of ischemic heart disease and other diseases of the circulatory system: Secondary | ICD-10-CM

## 2012-06-15 DIAGNOSIS — M79671 Pain in right foot: Secondary | ICD-10-CM

## 2012-06-15 DIAGNOSIS — E663 Overweight: Secondary | ICD-10-CM

## 2012-06-15 DIAGNOSIS — N179 Acute kidney failure, unspecified: Secondary | ICD-10-CM | POA: Diagnosis not present

## 2012-06-15 DIAGNOSIS — R0989 Other specified symptoms and signs involving the circulatory and respiratory systems: Secondary | ICD-10-CM

## 2012-06-15 DIAGNOSIS — N401 Enlarged prostate with lower urinary tract symptoms: Secondary | ICD-10-CM | POA: Diagnosis present

## 2012-06-15 DIAGNOSIS — M549 Dorsalgia, unspecified: Secondary | ICD-10-CM

## 2012-06-15 DIAGNOSIS — Z79899 Other long term (current) drug therapy: Secondary | ICD-10-CM

## 2012-06-15 DIAGNOSIS — N138 Other obstructive and reflux uropathy: Secondary | ICD-10-CM | POA: Diagnosis present

## 2012-06-15 DIAGNOSIS — Z833 Family history of diabetes mellitus: Secondary | ICD-10-CM

## 2012-06-15 DIAGNOSIS — E785 Hyperlipidemia, unspecified: Secondary | ICD-10-CM

## 2012-06-15 DIAGNOSIS — E1129 Type 2 diabetes mellitus with other diabetic kidney complication: Secondary | ICD-10-CM | POA: Diagnosis present

## 2012-06-15 DIAGNOSIS — I739 Peripheral vascular disease, unspecified: Secondary | ICD-10-CM

## 2012-06-15 DIAGNOSIS — E1149 Type 2 diabetes mellitus with other diabetic neurological complication: Secondary | ICD-10-CM

## 2012-06-15 DIAGNOSIS — N184 Chronic kidney disease, stage 4 (severe): Secondary | ICD-10-CM | POA: Diagnosis present

## 2012-06-15 DIAGNOSIS — E1142 Type 2 diabetes mellitus with diabetic polyneuropathy: Secondary | ICD-10-CM | POA: Diagnosis present

## 2012-06-15 DIAGNOSIS — I70219 Atherosclerosis of native arteries of extremities with intermittent claudication, unspecified extremity: Secondary | ICD-10-CM

## 2012-06-15 DIAGNOSIS — D631 Anemia in chronic kidney disease: Secondary | ICD-10-CM | POA: Diagnosis present

## 2012-06-15 DIAGNOSIS — E1159 Type 2 diabetes mellitus with other circulatory complications: Principal | ICD-10-CM | POA: Diagnosis present

## 2012-06-15 DIAGNOSIS — J449 Chronic obstructive pulmonary disease, unspecified: Secondary | ICD-10-CM

## 2012-06-15 DIAGNOSIS — G959 Disease of spinal cord, unspecified: Secondary | ICD-10-CM

## 2012-06-15 DIAGNOSIS — I6529 Occlusion and stenosis of unspecified carotid artery: Secondary | ICD-10-CM

## 2012-06-15 DIAGNOSIS — R269 Unspecified abnormalities of gait and mobility: Secondary | ICD-10-CM

## 2012-06-15 DIAGNOSIS — M1710 Unilateral primary osteoarthritis, unspecified knee: Secondary | ICD-10-CM

## 2012-06-15 DIAGNOSIS — IMO0002 Reserved for concepts with insufficient information to code with codable children: Secondary | ICD-10-CM

## 2012-06-15 DIAGNOSIS — I96 Gangrene, not elsewhere classified: Secondary | ICD-10-CM

## 2012-06-15 DIAGNOSIS — E875 Hyperkalemia: Secondary | ICD-10-CM

## 2012-06-15 DIAGNOSIS — I70269 Atherosclerosis of native arteries of extremities with gangrene, unspecified extremity: Secondary | ICD-10-CM | POA: Diagnosis present

## 2012-06-15 DIAGNOSIS — N058 Unspecified nephritic syndrome with other morphologic changes: Secondary | ICD-10-CM | POA: Diagnosis present

## 2012-06-15 DIAGNOSIS — Z794 Long term (current) use of insulin: Secondary | ICD-10-CM

## 2012-06-15 DIAGNOSIS — D62 Acute posthemorrhagic anemia: Secondary | ICD-10-CM | POA: Diagnosis not present

## 2012-06-15 DIAGNOSIS — I129 Hypertensive chronic kidney disease with stage 1 through stage 4 chronic kidney disease, or unspecified chronic kidney disease: Secondary | ICD-10-CM | POA: Diagnosis present

## 2012-06-15 DIAGNOSIS — M25562 Pain in left knee: Secondary | ICD-10-CM

## 2012-06-15 DIAGNOSIS — E119 Type 2 diabetes mellitus without complications: Secondary | ICD-10-CM

## 2012-06-15 DIAGNOSIS — N529 Male erectile dysfunction, unspecified: Secondary | ICD-10-CM

## 2012-06-15 HISTORY — DX: Myoneural disorder, unspecified: G70.9

## 2012-06-15 NOTE — Telephone Encounter (Signed)
Patient's wife calling.  Patient was seen in the hospital for right 4th toe infection.  Currently on antibiotic.  Toe was"looking better."  Today toe looks like "black wood" and "mushy between toes."  Patient has been changing dressing daily---exposing toe to air during day and applying "salve" at night.  Denies any odor.  Discussed with Dr. [name].  Advised wife to take patient to ED to have toe evaluated.

## 2012-06-15 NOTE — ED Notes (Signed)
Pt states that his second to last toe (next to his pinky toe) on his right foot started as small spot that he denied it popping. Pt states that he then noticed that his toe was becoming discolored. Pt second to last toe (next to pinky toe) on his right foot is black, hard, stiff, and no cap refill noticeable due to discoloration. Pt pinky toe and middle toe are same skin color as foot. Pt states that his right foot hurts all the way down to the ball of his foot. Pt states that when he wiggles his toes it hurts.

## 2012-06-15 NOTE — ED Notes (Signed)
Pharmacy currently at bedside.

## 2012-06-15 NOTE — ED Notes (Signed)
Pt c/o black 4th right toe digit x 3 weeks not getting better; pt sts wound on toe as well

## 2012-06-16 ENCOUNTER — Encounter (HOSPITAL_COMMUNITY): Payer: Self-pay | Admitting: General Practice

## 2012-06-16 ENCOUNTER — Ambulatory Visit: Payer: Medicare Other | Admitting: Family Medicine

## 2012-06-16 ENCOUNTER — Inpatient Hospital Stay (HOSPITAL_COMMUNITY): Payer: Medicare Other

## 2012-06-16 DIAGNOSIS — L97909 Non-pressure chronic ulcer of unspecified part of unspecified lower leg with unspecified severity: Secondary | ICD-10-CM

## 2012-06-16 DIAGNOSIS — N179 Acute kidney failure, unspecified: Secondary | ICD-10-CM

## 2012-06-16 DIAGNOSIS — I70269 Atherosclerosis of native arteries of extremities with gangrene, unspecified extremity: Secondary | ICD-10-CM

## 2012-06-16 DIAGNOSIS — I739 Peripheral vascular disease, unspecified: Secondary | ICD-10-CM

## 2012-06-16 DIAGNOSIS — M79609 Pain in unspecified limb: Secondary | ICD-10-CM

## 2012-06-16 LAB — CBC
HCT: 36.7 % — ABNORMAL LOW (ref 39.0–52.0)
MCHC: 32.7 g/dL (ref 30.0–36.0)
Platelets: 224 10*3/uL (ref 150–400)
RDW: 14.3 % (ref 11.5–15.5)
WBC: 9.8 10*3/uL (ref 4.0–10.5)

## 2012-06-16 LAB — RENAL FUNCTION PANEL
Albumin: 3.3 g/dL — ABNORMAL LOW (ref 3.5–5.2)
BUN: 82 mg/dL — ABNORMAL HIGH (ref 6–23)
Creatinine, Ser: 4.89 mg/dL — ABNORMAL HIGH (ref 0.50–1.35)
Glucose, Bld: 174 mg/dL — ABNORMAL HIGH (ref 70–99)
Phosphorus: 5.1 mg/dL — ABNORMAL HIGH (ref 2.3–4.6)
Potassium: 5.2 mEq/L — ABNORMAL HIGH (ref 3.5–5.1)

## 2012-06-16 LAB — BASIC METABOLIC PANEL
BUN: 81 mg/dL — ABNORMAL HIGH (ref 6–23)
CO2: 21 mEq/L (ref 19–32)
GFR calc non Af Amer: 11 mL/min — ABNORMAL LOW (ref >90)
Glucose, Bld: 186 mg/dL — ABNORMAL HIGH (ref 70–99)
Potassium: 5.5 mEq/L — ABNORMAL HIGH (ref 3.5–5.1)
Sodium: 136 mEq/L (ref 135–145)

## 2012-06-16 LAB — CBC WITH DIFFERENTIAL/PLATELET
Eosinophils Absolute: 0.2 10*3/uL (ref 0.0–0.7)
Hemoglobin: 11.3 g/dL — ABNORMAL LOW (ref 13.0–17.0)
Lymphocytes Relative: 21 % (ref 12–46)
Lymphs Abs: 2.4 10*3/uL (ref 0.7–4.0)
MCH: 28 pg (ref 26.0–34.0)
MCV: 87.6 fL (ref 78.0–100.0)
Monocytes Relative: 9 % (ref 3–12)
Neutrophils Relative %: 67 % (ref 43–77)
Platelets: 216 10*3/uL (ref 150–400)
RBC: 4.03 MIL/uL — ABNORMAL LOW (ref 4.22–5.81)
WBC: 11 10*3/uL — ABNORMAL HIGH (ref 4.0–10.5)

## 2012-06-16 LAB — CREATININE, SERUM
Creatinine, Ser: 5.01 mg/dL — ABNORMAL HIGH (ref 0.50–1.35)
GFR calc Af Amer: 13 mL/min — ABNORMAL LOW (ref >90)
GFR calc non Af Amer: 12 mL/min — ABNORMAL LOW (ref >90)

## 2012-06-16 LAB — GLUCOSE, CAPILLARY

## 2012-06-16 LAB — URINALYSIS, ROUTINE W REFLEX MICROSCOPIC
Bilirubin Urine: NEGATIVE
Ketones, ur: NEGATIVE mg/dL
Nitrite: NEGATIVE
Protein, ur: 100 mg/dL — AB
Urobilinogen, UA: 0.2 mg/dL (ref 0.0–1.0)

## 2012-06-16 LAB — URINE MICROSCOPIC-ADD ON

## 2012-06-16 MED ORDER — DOCUSATE SODIUM 100 MG PO CAPS
100.0000 mg | ORAL_CAPSULE | Freq: Two times a day (BID) | ORAL | Status: DC
  Administered 2012-06-16 – 2012-06-24 (×14): 100 mg via ORAL
  Filled 2012-06-16 (×18): qty 1

## 2012-06-16 MED ORDER — INSULIN ASPART 100 UNIT/ML ~~LOC~~ SOLN: 0.0000 [IU] | Freq: Every day | SUBCUTANEOUS | Status: DC

## 2012-06-16 MED ORDER — INSULIN ASPART 100 UNIT/ML ~~LOC~~ SOLN
0.0000 [IU] | Freq: Three times a day (TID) | SUBCUTANEOUS | Status: DC
  Administered 2012-06-16: 2 [IU] via SUBCUTANEOUS
  Administered 2012-06-16: 3 [IU] via SUBCUTANEOUS
  Administered 2012-06-18 – 2012-06-21 (×5): 2 [IU] via SUBCUTANEOUS
  Administered 2012-06-21: 3 [IU] via SUBCUTANEOUS
  Administered 2012-06-22: 2 [IU] via SUBCUTANEOUS
  Administered 2012-06-22: 3 [IU] via SUBCUTANEOUS
  Administered 2012-06-23: 2 [IU] via SUBCUTANEOUS

## 2012-06-16 MED ORDER — BACLOFEN 10 MG PO TABS
10.0000 mg | ORAL_TABLET | Freq: Two times a day (BID) | ORAL | Status: DC
  Administered 2012-06-16 – 2012-06-24 (×16): 10 mg via ORAL
  Filled 2012-06-16 (×20): qty 1

## 2012-06-16 MED ORDER — ONDANSETRON HCL 4 MG PO TABS: 4.0000 mg | ORAL_TABLET | Freq: Four times a day (QID) | ORAL | Status: DC | PRN

## 2012-06-16 MED ORDER — SIMVASTATIN 20 MG PO TABS
20.0000 mg | ORAL_TABLET | Freq: Every day | ORAL | Status: DC
  Administered 2012-06-16 – 2012-06-22 (×6): 20 mg via ORAL
  Filled 2012-06-16 (×11): qty 1

## 2012-06-16 MED ORDER — ASPIRIN 81 MG PO CHEW
81.0000 mg | CHEWABLE_TABLET | Freq: Every day | ORAL | Status: DC
  Administered 2012-06-16 – 2012-06-24 (×8): 81 mg via ORAL
  Filled 2012-06-16 (×9): qty 1

## 2012-06-16 MED ORDER — SODIUM POLYSTYRENE SULFONATE 15 GM/60ML PO SUSP
30.0000 g | Freq: Once | ORAL | Status: AC
  Administered 2012-06-16: 30 g via ORAL
  Filled 2012-06-16: qty 120

## 2012-06-16 MED ORDER — SODIUM CHLORIDE 0.9 % IV BOLUS (SEPSIS)
250.0000 mL | Freq: Once | INTRAVENOUS | Status: AC
  Administered 2012-06-16: 250 mL via INTRAVENOUS

## 2012-06-16 MED ORDER — ONDANSETRON HCL 4 MG/2ML IJ SOLN
4.0000 mg | Freq: Four times a day (QID) | INTRAMUSCULAR | Status: DC | PRN
  Filled 2012-06-16: qty 2

## 2012-06-16 MED ORDER — HEPARIN SODIUM (PORCINE) 5000 UNIT/ML IJ SOLN
5000.0000 [IU] | Freq: Three times a day (TID) | INTRAMUSCULAR | Status: DC
  Administered 2012-06-16 – 2012-06-17 (×6): 5000 [IU] via SUBCUTANEOUS
  Filled 2012-06-16 (×10): qty 1

## 2012-06-16 MED ORDER — INSULIN ASPART 100 UNIT/ML ~~LOC~~ SOLN
5.0000 [IU] | Freq: Once | SUBCUTANEOUS | Status: AC
  Administered 2012-06-16: 5 [IU] via SUBCUTANEOUS
  Filled 2012-06-16: qty 1

## 2012-06-16 MED ORDER — METOPROLOL SUCCINATE ER 25 MG PO TB24
25.0000 mg | ORAL_TABLET | Freq: Every day | ORAL | Status: DC
  Administered 2012-06-16 – 2012-06-21 (×6): 25 mg via ORAL
  Filled 2012-06-16 (×7): qty 1

## 2012-06-16 MED ORDER — TAMSULOSIN HCL 0.4 MG PO CAPS
0.4000 mg | ORAL_CAPSULE | Freq: Every day | ORAL | Status: DC
  Administered 2012-06-16 – 2012-06-21 (×5): 0.4 mg via ORAL
  Filled 2012-06-16 (×6): qty 1

## 2012-06-16 MED ORDER — ACETAMINOPHEN 650 MG RE SUPP: 650.0000 mg | Freq: Four times a day (QID) | RECTAL | Status: DC | PRN

## 2012-06-16 MED ORDER — NORTRIPTYLINE HCL 10 MG PO CAPS
10.0000 mg | ORAL_CAPSULE | Freq: Every day | ORAL | Status: DC
  Administered 2012-06-16 – 2012-06-21 (×6): 10 mg via ORAL
  Filled 2012-06-16 (×8): qty 1

## 2012-06-16 MED ORDER — SODIUM CHLORIDE 0.9 % IV SOLN
Freq: Once | INTRAVENOUS | Status: AC
  Administered 2012-06-16: 05:00:00 via INTRAVENOUS

## 2012-06-16 MED ORDER — AMLODIPINE BESYLATE 10 MG PO TABS
10.0000 mg | ORAL_TABLET | Freq: Every day | ORAL | Status: DC
  Administered 2012-06-16 – 2012-06-20 (×4): 10 mg via ORAL
  Filled 2012-06-16 (×5): qty 1

## 2012-06-16 MED ORDER — LIDOCAINE HCL 2 % EX GEL
Freq: Once | CUTANEOUS | Status: AC
  Administered 2012-06-16: 20 via URETHRAL
  Filled 2012-06-16: qty 20

## 2012-06-16 MED ORDER — INSULIN GLARGINE 100 UNIT/ML ~~LOC~~ SOLN
20.0000 [IU] | Freq: Every day | SUBCUTANEOUS | Status: DC
  Administered 2012-06-16 – 2012-06-19 (×2): 20 [IU] via SUBCUTANEOUS

## 2012-06-16 MED ORDER — SODIUM CHLORIDE 0.9 % IV SOLN
INTRAVENOUS | Status: DC
  Administered 2012-06-16 – 2012-06-19 (×3): via INTRAVENOUS

## 2012-06-16 MED ORDER — ACETAMINOPHEN 325 MG PO TABS: 650.0000 mg | ORAL_TABLET | Freq: Four times a day (QID) | ORAL | Status: DC | PRN

## 2012-06-16 MED ORDER — OXYCODONE HCL 5 MG PO TABS
5.0000 mg | ORAL_TABLET | ORAL | Status: DC | PRN
  Administered 2012-06-16 – 2012-06-17 (×6): 5 mg via ORAL
  Filled 2012-06-16 (×6): qty 1

## 2012-06-16 NOTE — H&P (Signed)
Family Medicine Teaching [facility] Admission History and Physical  Patient name: John [name] Medical record number: [id number] Date of birth: 09-28-1955 Age: 57 y.o. Gender: male  Primary Care Provider: [name], MD  Chief Complaint: Toe pain  History of Present Illness: John [name] is a 57 y.o. year old male with peripheral vascular disease who presents pain and discoloration of the 4th and 5th digits on the right toe. These symptoms started 3-4 weeks ago and have been worsening over that time. Over this period he was hospitalized for concern that it may be infectious. He was on IV Vanc and Zosyn and changed to PO septra and discharged on 12/23. Since that time he has had a follow up at [facility] and [facility]. He was continued on septra and instructions for daily dressing changes. He and his wife note that he has been compliant with this, but the problem has worsened. This has been accompanied by bilateral leg pain, but no pain in other toes. He also denies fever or swelling of his foot. He called his PCP yesterday, who instructed him to come to the ED. The ED physician was concerned about ischemic necrosis and attempted to contact vascular surgery. Additionally, he was found to have an acute kidney injury. He notes that Dr. Kathrene Bongo is his kidney doctor. The patient and his wife also note that he has had poor urine output today, but feels like he drinks sufficient fluids. He denies any known prostate problems.   Patient Active Problem List  Diagnosis  . NEUROPATHY, DIABETIC  . DIABETES MELLITUS, II, COMPLICATIONS  . HYPERLIPIDEMIA  . OVERWEIGHT  . TOBACCO DEPENDENCE  . HYPERTENSION, BENIGN SYSTEMIC  . CLAUDICATION, INTERMITTENT  . CHRONIC KIDNEY DISEASE STAGE III (MODERATE)  . IMPOTENCE, ORGANIC  . OSTEOARTHRITIS, LOWER LEG  . BACK PAIN, CHRONIC  . GAIT, ABNORMAL  . CEREBROVASCULAR ACCIDENT, HX OF  . HYPERKALEMIA  . COPD (chronic obstructive pulmonary disease)  .  Knee pain, left  . Foot pain, right  . Atherosclerosis of native arteries of the extremities with intermittent claudication  . Right carotid bruit  . Occlusion and stenosis of carotid artery without mention of cerebral infarction  . Cervical myelopathy  . Hyperkalemia  . Cellulitis of right foot   Past Medical History: Past Medical History  Diagnosis Date  . COPD (chronic obstructive pulmonary disease)   . Chronic back pain     Chronic narcotics for this for many years  . Diabetes mellitus   . Hyperlipidemia   . Hypertension   . Foot pain   . Peripheral vascular disease   . Asthma   . Stroke     No residual "Mini strokes"  . Ulcer 1985  . Fracture of foot     Compond- Right  . Chronic kidney disease     CKD stage III    Past Surgical History: Past Surgical History  Procedure Date  . Neck surgery   . Eye surgery   . Cardiac catheterization   . Endarterectomy 12/18/2011    Procedure: ENDARTERECTOMY CAROTID;  Surgeon: Chuck Hint, MD;  Location: [facility] OR;  Service: Vascular;  Laterality: Left;  . Carotid endarterectomy     Social History: History   Social History  . Marital Status: Married    Spouse Name: N/A    Number of Children: N/A  . Years of Education: N/A   Social History Main Topics  . Smoking status: Current Every Day Smoker -- 0.5 packs/day for  40 years    Types: Cigarettes  . Smokeless tobacco: Never Used     Comment: pt states that he is trying to quit  . Alcohol Use: No     Comment: Previous drinker - However, quit in 2006  . Drug Use: No  . Sexually Active: No   Other Topics Concern  . None   Social History Narrative  . None    Family History: Family History  Problem Relation Age of Onset  . Diabetes Mother   . Heart disease Mother   . Hypertension Mother   . Heart attack Mother   . Diabetes Father   . Heart disease Father   . Hypertension Father   . Heart attack Father   . Other Father     bleeding problems  . Diabetes  Brother   . Heart disease Brother     Allergies: Allergies  Allergen Reactions  . Gabapentin Other (See Comments)    Can't walk, shuts legs down  . Vicodin (Hydrocodone-Acetaminophen) Hives and Itching    Current Facility-Administered Medications  Medication Dose Route Frequency Provider Last Rate Last Dose  . 0.9 %  sodium chloride infusion   Intravenous Once Jones Skene, MD       Current Outpatient Prescriptions  Medication Sig Dispense Refill  . amLODipine (NORVASC) 10 MG tablet Take 10 mg by mouth daily.      Marland Kitchen aspirin 81 MG chewable tablet Chew 81 mg by mouth daily.      . baclofen (LIORESAL) 10 MG tablet Take 1 tablet (10 mg total) by mouth 2 (two) times daily.  30 each  1  . Calcium Citrate-Vitamin D (CITRACAL + D PO) Take 1 tablet by mouth daily.       . Furosemide (LASIX PO) Take 1 tablet by mouth 2 (two) times daily.      . insulin glargine (LANTUS) 100 UNIT/ML injection Inject 20-30 Units into the skin every morning.      . insulin lispro (HUMALOG) 100 UNIT/ML injection Inject 5 Units into the skin 2 (two) times daily before lunch and supper. Per sliding scale.      Marland Kitchen ketoconazole (NIZORAL) 2 % cream Apply topically daily. To feet  15 g  0  . metoprolol succinate (TOPROL-XL) 25 MG 24 hr tablet Take 1 tablet (25 mg total) by mouth daily.  30 tablet  11  . nortriptyline (PAMELOR) 25 MG capsule Take 1 capsule (25 mg total) by mouth at bedtime.  30 capsule  3  . oxyCODONE-acetaminophen (PERCOCET) 7.5-325 MG per tablet Take 1 tablet by mouth every 6 (six) hours as needed for pain.  120 tablet  0  . pravastatin (PRAVACHOL) 40 MG tablet Take 1 tablet (40 mg total) by mouth daily.  90 tablet  4  . sodium polystyrene (KAYEXALATE) 15 GM/60ML suspension Take 15 g by mouth See admin instructions. Takes every 3-4 days      . sulfamethoxazole-trimethoprim (SEPTRA DS) 800-160 MG per tablet 1 bid for 5 days  10 tablet  0  . [DISCONTINUED] tiotropium (SPIRIVA HANDIHALER) 18 MCG  inhalation capsule Place 1 capsule (18 mcg total) into inhaler and inhale daily.  30 capsule  2   Review Of Systems: Per HPI with the following additions: right shoulder neck pain  Otherwise 12 point review of systems was performed and was unremarkable.  Physical Exam: Temp:  [97.8 F (36.6 C)-98.7 F (37.1 C)] 97.8 F (36.6 C) (01/07 2134) Pulse Rate:  [82-97] 82  (01/07 2134)  Resp:  [18] 18  (01/07 2134) BP: (118-151)/(61-84) 151/84 mmHg (01/07 2134) SpO2:  [98 %] 98 % (01/07 2134)   General: chronically ill appearing male, appears older than stated age, non distressed, non toxic HEENT: PERRLA, extra ocular movement intact and oropharynx clear, no lesions Heart: S1, S2 normal, no murmur, rub or gallop, regular rate and rhythm Lungs: clear to auscultation, no wheezes or rales and unlabored breathing Abdomen: abdomen is soft without significant tenderness, masses, organomegaly or guarding Rectal Exam: enlarged prostate Extremities: dark discoloration of entire 4th digit on right foot with firm contracted tissue, dark and dusky tissue on medial side of 5th digit of right foot; DP and PT pulses not palpable bilaterally Skin: minor excoriation of skin at base of toes 4/5 on right foot Neurology: normal without focal findings, mental status, speech normal, alert and oriented x3 and PERLA  Labs and Imaging:  Results for orders placed during the hospital encounter of 06/15/12 (from the past 24 hour(s))  CBC WITH DIFFERENTIAL     Status: Abnormal   Collection Time   06/15/12 11:56 PM      Component Value Range   WBC 11.0 (*) 4.0 - 10.5 K/uL   RBC 4.03 (*) 4.22 - 5.81 MIL/uL   Hemoglobin 11.3 (*) 13.0 - 17.0 g/dL   HCT 40.9 (*) 81.1 - 91.4 %   MCV 87.6  78.0 - 100.0 fL   MCH 28.0  26.0 - 34.0 pg   MCHC 32.0  30.0 - 36.0 g/dL   RDW 78.2  95.6 - 21.3 %   Platelets 216  150 - 400 K/uL   Neutrophils Relative 67  43 - 77 %   Neutro Abs 7.4  1.7 - 7.7 K/uL   Lymphocytes Relative 21  12 -  46 %   Lymphs Abs 2.4  0.7 - 4.0 K/uL   Monocytes Relative 9  3 - 12 %   Monocytes Absolute 1.0  0.1 - 1.0 K/uL   Eosinophils Relative 2  0 - 5 %   Eosinophils Absolute 0.2  0.0 - 0.7 K/uL   Basophils Relative 0  0 - 1 %   Basophils Absolute 0.0  0.0 - 0.1 K/uL  BASIC METABOLIC PANEL     Status: Abnormal   Collection Time   06/15/12 11:56 PM      Component Value Range   Sodium 136  135 - 145 mEq/L   Potassium 5.5 (*) 3.5 - 5.1 mEq/L   Chloride 101  96 - 112 mEq/L   CO2 21  19 - 32 mEq/L   Glucose, Bld 186 (*) 70 - 99 mg/dL   BUN 81 (*) 6 - 23 mg/dL   Creatinine, Ser 0.86 (*) 0.50 - 1.35 mg/dL   Calcium 8.9  8.4 - 57.8 mg/dL   GFR calc non Af Amer 11 (*) >90 mL/min   GFR calc Af Amer 13 (*) >90 mL/min    Dg Toe 4th Right  06/15/2012  *RADIOLOGY REPORT*  Clinical Data: Diabetes mellitus.  Chronic renal disease. Suspected 4,5 toe  gangrene.  RIGHT FOURTH TOE  Comparison: MRI 05/30/2012.  Findings: Diffuse soft tissue swelling.  Vascular calcification. No plain film findings of osteomyelitis.  No acute fracture or dislocation.  IMPRESSION: Diffuse soft tissue swelling.  No plain film findings of osteomyelitis.   Original Report Authenticated By: Davonna Belling, M.D.       Assessment and Plan: RICHARDSON DUBREE is a 57 y.o. year old male presenting with ischemic necrosis  of his 4th and 5th digits of the right foot and acute on chronic kidney injury.   # Ischemic Necrosis of Toes - No evidence of infection on exam, patient with known PVD and already established with Dr. Edilia Bo; will likely need digit amputation - Consult to vascular surgery - Pain control with oxycodone  # Acute Kidney Injury - Patient with known CKDIII; I'm concerned that this is post-obstructive given symptoms and size of prostate; problem needs to be addressed rapidly if patient to go to OR or have study with contrast  - Insert foley - obtain urinalysis and renal ultrasound - Cont IV hydration in addition to PO - Check  renal panel  #Hyperkalemia - Seems that this is a chronic issue, possibly related to CKD - S/p kayexalate x 1 - F/u K+ today  # Prostatic Hyperplasia - Notes hx of going to [facility] but denies prostate problem - Start Tamsulosin daily    #DM - SSI  # HTN - Cont amlodipine, metoprolol  FENGI: regular diet, NS @ 100,  PPX: Heparin sub Q DISPO: Admit to [facility] Medicine Teaching Service   Si Raider. Clinton Sawyer, MD, MBA 06/16/2012, 3:54 AM Family Medicine Resident, PGY-2 [phone] pager

## 2012-06-16 NOTE — Consult Note (Signed)
[facility] [place]      Consult Note  Patient name: John [name] MRN: [id number] DOB: 1955/09/27 Sex: male  Consulting Physician:  Emergency department  Reason for Consult:  Chief Complaint  Patient presents with  . Toe Pain    HISTORY OF PRESENT ILLNESS: This is a very pleasant 57 year old gentleman with multiple medical issues who presented to the emergency department tonight with dry gangrene of his right fourth and fifth digits. The patient states that this process has been groin on for approximately 3 weeks. He has been on antibiotics without improvement. He denies fevers or chills. He does have pain in his legs and toes at night. Prior to this episode he reports approximately a 50-100 feet claudication distance.  The patient has a history of hypertension hyperlipidemia and diabetes, all of which are medically managed. He has previously undergone a left carotid endarterectomy in July of 2013 for asymptomatic stenosis. He has previously undergone lower extremity angiography in June of 2013. This revealed a patent infrarenal abdominal aorta as well as bilateral iliac vessels. The right lower extremity revealed in the common femoral artery was widely patent as was the profunda femoral artery. The superficial femoral artery was occluded 3 cm after its takeoff with reconstitution of the popliteal artery with the below knee popliteal as the better target. The peroneal and posterior tibial arteries were the patent runoff.  The patient also has chronic renal insufficiency. His creatinine was significantly elevated on admission.  Past Medical History  Diagnosis Date  . COPD (chronic obstructive pulmonary disease)   . Chronic back pain     Chronic narcotics for this for many years  . Diabetes mellitus   . Hyperlipidemia   . Hypertension   . Foot pain   . Peripheral vascular disease   . Asthma   . Stroke     No residual "Mini strokes"  . Ulcer 1985  . Fracture of  foot     Compond- Right  . Chronic kidney disease     CKD stage III    Past Surgical History  Procedure Date  . Neck surgery   . Eye surgery   . Cardiac catheterization   . Endarterectomy 12/18/2011    Procedure: ENDARTERECTOMY CAROTID;  Surgeon: Chuck Hint, MD;  Location: [facility] OR;  Service: Vascular;  Laterality: Left;  . Carotid endarterectomy     History   Social History  . Marital Status: Married    Spouse Name: N/A    Number of Children: N/A  . Years of Education: N/A   Occupational History  . Not on file.   Social History Main Topics  . Smoking status: Current Every Day Smoker -- 0.5 packs/day for 40 years    Types: Cigarettes  . Smokeless tobacco: Never Used     Comment: pt states that he is trying to quit  . Alcohol Use: No     Comment: Previous drinker - However, quit in 2006  . Drug Use: No  . Sexually Active: No   Other Topics Concern  . Not on file   Social History Narrative  . No narrative on file    Family History  Problem Relation Age of Onset  . Diabetes Mother   . Heart disease Mother   . Hypertension Mother   . Heart attack Mother   . Diabetes Father   . Heart disease Father   . Hypertension Father   . Heart attack Father   . Other  Father     bleeding problems  . Diabetes Brother   . Heart disease Brother     Allergies as of 06/15/2012 - Review Complete 06/15/2012  Allergen Reaction Noted  . Gabapentin Other (See Comments) 06/15/2012  . Vicodin (hydrocodone-acetaminophen) Hives and Itching 08/31/2011    No current facility-administered medications on file prior to encounter.   Current Outpatient Prescriptions on File Prior to Encounter  Medication Sig Dispense Refill  . amLODipine (NORVASC) 10 MG tablet Take 10 mg by mouth daily.      Marland Kitchen aspirin 81 MG chewable tablet Chew 81 mg by mouth daily.      . baclofen (LIORESAL) 10 MG tablet Take 1 tablet (10 mg total) by mouth 2 (two) times daily.  30 each  1  . Calcium  Citrate-Vitamin D (CITRACAL + D PO) Take 1 tablet by mouth daily.       . insulin lispro (HUMALOG) 100 UNIT/ML injection Inject 5 Units into the skin 2 (two) times daily before lunch and supper. Per sliding scale.      Marland Kitchen ketoconazole (NIZORAL) 2 % cream Apply topically daily. To feet  15 g  0  . metoprolol succinate (TOPROL-XL) 25 MG 24 hr tablet Take 1 tablet (25 mg total) by mouth daily.  30 tablet  11  . nortriptyline (PAMELOR) 25 MG capsule Take 1 capsule (25 mg total) by mouth at bedtime.  30 capsule  3  . oxyCODONE-acetaminophen (PERCOCET) 7.5-325 MG per tablet Take 1 tablet by mouth every 6 (six) hours as needed for pain.  120 tablet  0  . pravastatin (PRAVACHOL) 40 MG tablet Take 1 tablet (40 mg total) by mouth daily.  90 tablet  4  . sulfamethoxazole-trimethoprim (SEPTRA DS) 800-160 MG per tablet 1 bid for 5 days  10 tablet  0  . [DISCONTINUED] tiotropium (SPIRIVA HANDIHALER) 18 MCG inhalation capsule Place 1 capsule (18 mcg total) into inhaler and inhale daily.  30 capsule  2     REVIEW OF SYSTEMS: Cardiovascular: No chest pain, chest pressure, palpitations, orthopnea, or dyspnea on exertion. Pulmonary: No productive cough, asthma or wheezing. Neurologic: No weakness, paresthesias, aphasia, or amaurosis. No dizziness. Hematologic: No bleeding problems or clotting disorders. Musculoskeletal: No joint pain or joint swelling. Gastrointestinal: No blood in stool or hematemesis Genitourinary: Renal insufficiency Psychiatric:: No history of major depression. Integumentary: right fourth and fifth toe  Constitutional: No fever or chills.  PHYSICAL EXAMINATION: General: The patient appears their stated age.  Vital signs are BP 173/79  Pulse 85  Temp 97.9 F (36.6 C) (Oral)  Resp 18  SpO2 100% Pulmonary: Respirations are non-labored HEENT:  No gross abnormalities Abdomen: Soft and non-tender  Musculoskeletal: There are no major deformities.   Neurologic: No focal weakness or  paresthesias are detected, Skin: dry gangrene to the right fourth and fifth digit Psychiatric: The patient has normal affect. Cardiovascular: There is a regular rate and rhythm without significant murmur appreciated.I cannot palpate femoral pulses today.   Diagnostic Studies: none     Assessment:  Dry gangrene, right fourth and fifth digit Plan:  this is a limb threatening situation for the patient. This is somewhat complicated by his renal insufficiency, as he will need to have his arterial anatomy defined. This will be difficult without inability to use contrast. I will obtain a duplex ultrasound initially, potentially I will obtain a MRI without contrast to evaluate his aortoiliac system. If his aortoiliac vessels as well as femoral vessels are patent, he did  undergo angiography with CO2. This was done approximately 7 months ago which did show he had targets for bypass. Since it is been 7 months since this procedure, I feel that it needs to be repeated, especially given the fact that I cannot feel femoral pulses today. I discussed with the patient that we will not be able to salvage the fourth and fifth digit. The biggest concern is getting the amputation to heal. He will require revascularization in order to heal his wound. If he is not a candidate for revascularization, this will likely lead to either a below or above knee amputation.   The patient needs to be hydrated for his renal insufficiency, especially given the fact that he may need catheter-based angiography. If he does progress to where he needs dialysis, contrast would not be an issue. We'll continue to follow the patient while he is in the hospital with hopefully plans for revascularization with amputation within the next several days.     Jorge Ny, M.D. [facility] [place] Office: [phone] Pager:  [phone]

## 2012-06-16 NOTE — Consult Note (Signed)
Referring Provider: No ref. provider found Primary Care Physician:  [name], MD Primary Nephrologist:  Dr. Kathrene Bongo Reason for Consultation:  CKD 5 HPI: 57 y.o. male pertinent medical history of diabetes, chronic kidney disease and peripheral vascular disease presents with complaints of right foot and toe pain as well as worsening fourth digit on the right foot appearance the last 3 days. Known CKD 4, followed by Dr Kathrene Bongo. No definite plans for dialysis at present   Past Medical History  Diagnosis Date  . COPD (chronic obstructive pulmonary disease)   . Chronic back pain     Chronic narcotics for this for many years  . Diabetes mellitus   . Hyperlipidemia   . Hypertension   . Foot pain   . Peripheral vascular disease   . Asthma   . Stroke     No residual "Mini strokes"  . Ulcer 1985  . Fracture of foot     Compond- Right  . Chronic kidney disease     CKD stage III  . Neuromuscular disorder     periferal neuropathy    Past Surgical History  Procedure Date  . Neck surgery   . Eye surgery   . Cardiac catheterization   . Endarterectomy 12/18/2011    Procedure: ENDARTERECTOMY CAROTID;  Surgeon: Chuck Hint, MD;  Location: [facility] OR;  Service: Vascular;  Laterality: Left;  . Carotid endarterectomy     Prior to Admission medications   Medication Sig Start Date End Date Taking? Authorizing Provider  amLODipine (NORVASC) 10 MG tablet Take 10 mg by mouth daily.   Yes Historical Provider, MD  aspirin 81 MG chewable tablet Chew 81 mg by mouth daily.   Yes Historical Provider, MD  baclofen (LIORESAL) 10 MG tablet Take 1 tablet (10 mg total) by mouth 2 (two) times daily. 03/03/12  Yes Dayarmys [place] Criselda Peaches, MD  Calcium Citrate-Vitamin D (CITRACAL + D PO) Take 1 tablet by mouth daily.    Yes Historical Provider, MD  Furosemide (LASIX PO) Take 1 tablet by mouth 2 (two) times daily.   Yes Historical Provider, MD  insulin glargine (LANTUS) 100 UNIT/ML injection Inject  20-30 Units into the skin every morning. 05/31/12  Yes Jacquelyn A McGill, MD  insulin lispro (HUMALOG) 100 UNIT/ML injection Inject 5 Units into the skin 2 (two) times daily before lunch and supper. Per sliding scale. 08/13/10  Yes Sanjuana Letters, MD  ketoconazole (NIZORAL) 2 % cream Apply topically daily. To feet 05/31/12  Yes Latrelle Dodrill, MD  metoprolol succinate (TOPROL-XL) 25 MG 24 hr tablet Take 1 tablet (25 mg total) by mouth daily. 12/08/11 12/07/12 Yes Ardyth Gal, MD  nortriptyline (PAMELOR) 25 MG capsule Take 1 capsule (25 mg total) by mouth at bedtime. 03/10/12  Yes Dayarmys [place]da Peaches, MD  oxyCODONE-acetaminophen (PERCOCET) 7.5-325 MG per tablet Take 1 tablet by mouth every 6 (six) hours as needed for pain. 05/19/12  Yes Nestor Ramp, MD  pravastatin (PRAVACHOL) 40 MG tablet Take 1 tablet (40 mg total) by mouth daily. 03/10/12  Yes Dayarmys [place]da Peaches, MD  sodium polystyrene (KAYEXALATE) 15 GM/60ML suspension Take 15 g by mouth See admin instructions. Takes every 3-4 days   Yes Historical Provider, MD  sulfamethoxazole-trimethoprim [facility] DS) 800-160 MG per tablet 1 bid for 5 days 06/11/12  Yes Hayden Rasmussen, NP    Current Facility-Administered Medications  Medication Dose Route Frequency Provider Last Rate Last Dose  . 0.9 %  sodium  chloride infusion   Intravenous Continuous Tommie Sams, DO 100 mL/hr at 06/16/12 1617    . acetaminophen (TYLENOL) tablet 650 mg  650 mg Oral Q6H PRN Garnetta Buddy, MD       Or  . acetaminophen (TYLENOL) suppository 650 mg  650 mg Rectal Q6H PRN Garnetta Buddy, MD      . amLODipine (NORVASC) tablet 10 mg  10 mg Oral Daily Garnetta Buddy, MD   10 mg at 06/16/12 1017  . aspirin chewable tablet 81 mg  81 mg Oral Daily Garnetta Buddy, MD   81 mg at 06/16/12 1017  . baclofen (LIORESAL) tablet 10 mg  10 mg Oral BID Garnetta Buddy, MD   10 mg at 06/16/12 1017  . docusate sodium (COLACE) capsule 100 mg  100 mg Oral  BID Garnetta Buddy, MD   100 mg at 06/16/12 1017  . heparin injection 5,000 Units  5,000 Units Subcutaneous Q8H Garnetta Buddy, MD   5,000 Units at 06/16/12 1613  . insulin aspart (novoLOG) injection 0-15 Units  0-15 Units Subcutaneous TID WC Garnetta Buddy, MD   3 Units at 06/16/12 1144  . insulin aspart (novoLOG) injection 0-5 Units  0-5 Units Subcutaneous QHS Garnetta Buddy, MD      . insulin glargine (LANTUS) injection 20 Units  20 Units Subcutaneous Daily Garnetta Buddy, MD   20 Units at 06/16/12 1017  . metoprolol succinate (TOPROL-XL) 24 hr tablet 25 mg  25 mg Oral Daily Garnetta Buddy, MD   25 mg at 06/16/12 1017  . nortriptyline (PAMELOR) capsule 10 mg  10 mg Oral QHS Garnetta Buddy, MD      . ondansetron [facility]) tablet 4 mg  4 mg Oral Q6H PRN Garnetta Buddy, MD       Or  . ondansetron [facility]) injection 4 mg  4 mg Intravenous Q6H PRN Garnetta Buddy, MD      . oxyCODONE (Oxy IR/ROXICODONE) immediate release tablet 5 mg  5 mg Oral Q4H PRN Garnetta Buddy, MD   5 mg at 06/16/12 1221  . simvastatin (ZOCOR) tablet 20 mg  20 mg Oral q1800 Garnetta Buddy, MD      . Tamsulosin HCl [facility]) capsule 0.4 mg  0.4 mg Oral Daily Garnetta Buddy, MD   0.4 mg at 06/16/12 1017    Allergies as of 06/15/2012 - Review Complete 06/15/2012  Allergen Reaction Noted  . Gabapentin Other (See Comments) 06/15/2012  . Vicodin (hydrocodone-acetaminophen) Hives and Itching 08/31/2011    Family History  Problem Relation Age of Onset  . Diabetes Mother   . Heart disease Mother   . Hypertension Mother   . Heart attack Mother   . Diabetes Father   . Heart disease Father   . Hypertension Father   . Heart attack Father   . Other Father     bleeding problems  . Diabetes Brother   . Heart disease Brother     History   Social History  . Marital Status: Married    Spouse Name: N/A    Number of Children: N/A  . Years of Education: N/A    Occupational History  . Not on file.   Social History Main Topics  . Smoking status: Current Every Day Smoker -- 0.5 packs/day for 40 years    Types: Cigarettes  . Smokeless tobacco: Never Used     Comment: pt states that he is trying  to quit  . Alcohol Use: No     Comment: Previous drinker - However, quit in 2006  . Drug Use: No  . Sexually Active: No   Other Topics Concern  . Not on file   Social History Narrative  . No narrative on file    Review of Systems: Gen: Denies any fever, chills, sweats, anorexia,+ fatigue, + weakness,+ malaise, weight loss, and sleep disorder HEENT: Retinopathy diminished.   CV: Denies chest pain, angina, palpitations, syncope, orthopnea, PND, peripheral edema, and claudication.Atherosclerosis of native arteries of the extremities with intermittent claudication. Right carotid bruit  Resp: Denies dyspnea at rest, dyspnea with exercise, cough, sputum, wheezing, coughing up blood, and pleurisy. GI: Denies vomiting blood, jaundice, and fecal incontinence.   Denies dysphagia or odynophagia. GU :no urinary complaints. MS: Denies joint pain, limitation of movement, and swelling, stiffness, low back pain, extremity pain. Denies muscle weakness, cramps, atrophy.  No use of non steroidal antiinflammatory drugs. Derm: Denies rash, itching, dry skin, hives, moles, warts, or unhealing ulcers.  Psych: Denies depression, anxiety, memory loss, suicidal ideation, hallucinations, paranoia, and confusion. Heme: Denies bruising, bleeding, and enlarged lymph nodes. Neuro: No headache.  No diplopia. No dysarthria.  No dysphasia.  No history of CVA.  No Seizures. No paresthesias.  No weakness. Endocrine No DM.  No Thyroid disease.  No Adrenal disease.  Physical Exam: Vital signs in last 24 hours: Temp:  [97.8 F (36.6 C)-98.7 F (37.1 C)] 97.9 F (36.6 C) (01/08 0651) Pulse Rate:  [82-97] 85  (01/08 0651) Resp:  [18] 18  (01/08 0651) BP: (118-173)/(61-84) 173/79  mmHg (01/08 0651) SpO2:  [98 %-100 %] 100 % (01/08 0651)   General:   Chronically ill Head:  Normocephalic and atraumatic. Eyes:  Sclera clear, no icterus.   Conjunctiva pink. Ears:  Normal auditory acuity. Nose:  No deformity, discharge,  or lesions. Mouth:  No deformity or lesions, dentition normal. Neck:  Supple; no masses or thyromegaly. JVP not elevated Lungs:  Clear throughout to auscultation.   No wheezes, crackles, or rhonchi. No acute distress. Heart:  Regular rate and rhythm; no murmurs, clicks, rubs,  or gallops. Abdomen:  Soft, nontender and nondistended. No masses, hepatosplenomegaly or hernias noted. Normal bowel sounds, without guarding, and without rebound.   Msk:  Symmetrical without gross deformities. Normal posture. Pulses:  No carotid, renal, femoral bruits. DP and PT symmetrical and equal Extremities:  Without clubbing or edema. Neurologic:  Alert and  oriented x4;  grossly normal neurologically. Skin:  Intact without significant lesions or rashes. Cervical Nodes:  No significant cervical adenopathy. Psych:  Alert and cooperative. Normal mood and affect.  Intake/Output from previous day: 01/07 0701 - 01/08 0700 In: -  Out: 1000 [Urine:1000] Intake/Output this shift:    Lab Results:  Basename 06/16/12 0514 06/15/12 2356  WBC 9.8 11.0*  HGB 12.0* 11.3*  HCT 36.7* 35.3*  PLT 224 216   BMET  Basename 06/16/12 0858 06/16/12 0514 06/15/12 2356  NA 139 -- 136  K 5.2* -- 5.5*  CL 105 -- 101  CO2 21 -- 21  GLUCOSE 174* -- 186*  BUN 82* -- 81*  CREATININE 4.89* 5.01* 5.18*  CALCIUM 8.7 -- 8.9  PHOS 5.1* -- --   LFT  Basename 06/16/12 0858  PROT --  ALBUMIN 3.3*  AST --  ALT --  ALKPHOS --  BILITOT --  BILIDIR --  IBILI --   PT/INR No results found for this basename: LABPROT:2,INR:2 in the last  72 hours Hepatitis Panel No results found for this basename: HEPBSAG,HCVAB,HEPAIGM,HEPBIGM in the last 72 hours  Studies/Results: US  Renal  06/16/2012  *RADIOLOGY REPORT*  Clinical Data: Injury, pain.  RENAL/URINARY TRACT ULTRASOUND COMPLETE  Comparison:  Renal ultrasound 01/28/2008 and CT abdomen and pelvis 10/19/2005.  Findings:  Right Kidney:  Measures 11.0 cm.  Cortical echogenicity is somewhat increased.  There is a 1.2 cm cyst in the lower pole.  No stone or hydronephrosis.  Left Kidney:  Measures 11.1 cm.  There is some increased cortical echogenicity.  No stone or hydronephrosis.  A 2.1 x 2.2 x 2.0 cm cyst is seen in the mid pole with a single thin septation.  There is also a simple 2.4 x 2.3 x 1.8 cm cyst in the upper pole.  No stone or hydronephrosis.  Bladder:  Decompressed with Foley catheter in place.  IMPRESSION:  1.  Negative for hydronephrosis or acute abnormality. 2.  Increased cortical echogenicity compatible with medical renal disease. 3.  Bilateral renal cysts as described.   Original Report Authenticated By: Holley Dexter, M.D.    Dg Toe 4th Right  06/15/2012  *RADIOLOGY REPORT*  Clinical Data: Diabetes mellitus.  Chronic renal disease. Suspected 4,5 toe  gangrene.  RIGHT FOURTH TOE  Comparison: MRI 05/30/2012.  Findings: Diffuse soft tissue swelling.  Vascular calcification. No plain film findings of osteomyelitis.  No acute fracture or dislocation.  IMPRESSION: Diffuse soft tissue swelling.  No plain film findings of osteomyelitis.   Original Report Authenticated By: Davonna Belling, M.D.     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 Io recommend placement of AVF while hospitalized    LOS: 1 Yomayra Tate W @TODAY @4 :46 PM

## 2012-06-16 NOTE — Progress Notes (Signed)
Utilization review completed. Florine Sprenkle, RN, BSN. 

## 2012-06-16 NOTE — Progress Notes (Signed)
*  PRELIMINARY RESULTS* Vascular Ultrasound Lower Extremity Arterial Duplex has been completed.  Moderate to severe multi-level disease is exhibited, consistent with rest pain in right leg and claudication in left leg.  06/16/2012 6:16 PM Gertie Fey, RDMS, RDCS

## 2012-06-16 NOTE — ED Notes (Signed)
Family practice resident here to see pt.

## 2012-06-16 NOTE — ED Notes (Signed)
returned from [place] and transported to 5N via stretcher.

## 2012-06-16 NOTE — ED Notes (Signed)
Patient transported to Ultrasound 

## 2012-06-16 NOTE — H&P (Signed)
FMTS Attending Admission Note: Renold Don MD Personal pager:  [phone] FPTS Service Pager:  [phone]  I  have seen and examined this patient, reviewed their chart. I have discussed this patient with the resident. I agree with the resident's findings, assessment and care plan.  Briefly, 57 yo M with PMH significant for poorly controlled DM2, known PAD, and CK Stage II-IV who presents with several day history of increasing toe pain and discoloration.  Recently had admission for cellulitis of Right toe, cleared after being placed on IV Vanc/Zosyn and outpatient Septra.  However he has noted continued discoloration of 4th and 5th digits of Right foot since that time.  Pain began with walking several days ago and he asked wife to look at his toe.  When she saw the discoloration she prompted him to come to the hospital.  Concern for ischemic toe, therefore admitted to FPTS.  No fevers or chills.  No discharge from toe.  Also of note, found to have acute kidney injuy with creatinine >5.  Baseline around 3.  Reports some mild urinary hesitancy but no dysuria.  Exam:  Gen:  Adult AAM sitting in hospital bed, awake and alert Ext:  No LE edema  Feet:   - Right foot WNL except discoloration noted distal tuft of great toe - Left foot with dusky discoloration medial and posterior aspect of 5th digit and entire 4th digit CV:  Absent DP/PT pulses BL  Imp/Plan: 1.  Ishemic toe: - Agree with vascular consult, likely amputation this admit - Agree with pain management - No evidence of infection  2.  AKI:   - Renal US showed no evidence hydronephrosis - Denies any nephrotoxic agents above furosemide - UA with proteinuria - Renal panel pending this AM - Continue gentle rehydration with plans to stop IV fluids after this bag.  Want to avoid fluid overload - Foley placed, strict I/Os.  I am less impressed with his history of BPH symptoms, though would like to rule out post-obstructive cause of AKI. -  Recommend Renal consult.

## 2012-06-16 NOTE — ED Provider Notes (Signed)
History     CSN: [id number]  Arrival date & time 06/15/12  1701   First MD Initiated Contact with Patient 06/15/12 2322      Chief Complaint  Patient presents with  . Toe Pain    (Consider location/radiation/quality/duration/timing/severity/associated sxs/prior treatment) HPISteve R [name] is a 57 y.o. male pertinent medical history of diabetes, chronic kidney disease and peripheral vascular disease presents with complaints of right foot and toe pain as well as worsening fourth digit on the right foot appearance the last 3 days. Patient was hospitalized about a month ago for a toe infection, given IV antibiotics and converted to by mouth antibiotics. He should be doing better he thought until about 3 days ago and the toe started hurting worse and looking worse. Today the fourth digit does not hurt quite as bad but the pain is more in the fifth digit and higher up in the foot. There's been no fevers or chills, nausea or diarrhea, patient has vomited a couple times. No abdominal pain. No cough or chest pain. Patient does see Dr. Jen Mow for vascular surgery. 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   Past Medical History  Diagnosis Date  . COPD (chronic obstructive pulmonary disease)   . Chronic back pain     Chronic narcotics for this for many years  . Diabetes mellitus   . Hyperlipidemia   . Hypertension   . Foot pain   . Peripheral vascular disease   . Asthma   . Stroke     No residual "Mini strokes"  . Ulcer 1985  . Fracture of foot     [place]- Right  . Chronic kidney disease     CKD stage III    Past Surgical History  Procedure Date  . Neck surgery   . Eye surgery   . Cardiac catheterization   . Endarterectomy 12/18/2011    Procedure: ENDARTERECTOMY CAROTID;  Surgeon: Chuck Hint, MD;  Location: [facility] OR;  Service: Vascular;  Laterality: Left;  . Carotid endarterectomy     Family History  Problem Relation Age of Onset  . Diabetes  Mother   . Heart disease Mother   . Hypertension Mother   . Heart attack Mother   . Diabetes Father   . Heart disease Father   . Hypertension Father   . Heart attack Father   . Other Father     bleeding problems  . Diabetes Brother   . Heart disease Brother     History  Substance Use Topics  . Smoking status: Current Every Day Smoker -- 0.5 packs/day for 40 years    Types: Cigarettes  . Smokeless tobacco: Never Used     Comment: pt states that he is trying to quit  . Alcohol Use: No     Comment: Previous drinker - However, quit in 2006      Review of Systems At least 10pt or greater review of systems completed and are negative except where specified in the HPI.  Allergies  Gabapentin and Vicodin  Home Medications   Current Outpatient Rx  Name  Route  Sig  Dispense  Refill  . AMLODIPINE BESYLATE 10 MG PO TABS   Oral   Take 10 mg by mouth daily.         . ASPIRIN 81 MG PO CHEW   Oral   Chew 81 mg by mouth daily.         Marland Kitchen BACLOFEN 10  MG PO TABS   Oral   Take 1 tablet (10 mg total) by mouth 2 (two) times daily.   30 each   1   . CITRACAL + D PO   Oral   Take 1 tablet by mouth daily.          Marland Kitchen LASIX PO   Oral   Take 1 tablet by mouth 2 (two) times daily.         . INSULIN GLARGINE 100 UNIT/ML [place] SOLN   Subcutaneous   Inject 20-30 Units into the skin every morning.         . INSULIN LISPRO (HUMAN) 100 UNIT/ML [place] SOLN   Subcutaneous   Inject 5 Units into the skin 2 (two) times daily before lunch and supper. Per sliding scale.         Marland Kitchen KETOCONAZOLE 2 % EX CREA   Topical   Apply topically daily. To feet   15 g   0   . METOPROLOL SUCCINATE ER 25 MG PO TB24   Oral   Take 1 tablet (25 mg total) by mouth daily.   30 tablet   11   . NORTRIPTYLINE HCL 25 MG PO CAPS   Oral   Take 1 capsule (25 mg total) by mouth at bedtime.   30 capsule   3   . OXYCODONE-ACETAMINOPHEN 7.5-325 MG PO TABS   Oral   Take 1 tablet by mouth every 6 (six)  hours as needed for pain.   120 tablet   0   . PRAVASTATIN SODIUM 40 MG PO TABS   Oral   Take 1 tablet (40 mg total) by mouth daily.   90 tablet   4   . SODIUM POLYSTYRENE SULFONATE 15 GM/60ML PO SUSP   Oral   Take 15 g by mouth See admin instructions. Takes every 3-4 days         . SULFAMETHOXAZOLE-TRIMETHOPRIM 800-160 MG PO TABS      1 bid for 5 days   10 tablet   0     BP 151/84  Pulse 82  Temp 97.8 F (36.6 C) (Oral)  Resp 18  SpO2 98%  Physical Exam  Nursing notes reviewed.  Electronic medical record reviewed. VITAL SIGNS:   Filed Vitals:   06/15/12 1709 06/15/12 2134  BP: 118/61 151/84  Pulse: 97 82  Temp: 98.7 F (37.1 C) 97.8 F (36.6 C)  TempSrc: Oral Oral  Resp: 18 18  SpO2: 98% 98%   CONSTITUTIONAL: Awake, oriented, appears non-toxic HENT: Atraumatic, normocephalic, oral mucosa pink and moist, airway patent. Nares patent without drainage. External ears normal. EYES: Conjunctiva clear, EOMI, PERRLA NECK: Trachea midline, non-tender, supple CARDIOVASCULAR: Normal heart rate, Normal rhythm, No murmurs, rubs, gallops PULMONARY/CHEST: Clear to auscultation, no rhonchi, wheezes, or rales. Symmetrical breath sounds. Non-tender. ABDOMINAL: Non-distended, soft, non-tender - no rebound or guarding.  BS normal. NEUROLOGIC: Non-focal, moving all four extremities, no gross sensory or motor deficits. EXTREMITIES: No clubbing, cyanosis, or edema. Right foot has a darker pigment than the left foot. Posterior tibialis pulse is palpated and is easily appreciated on Doppler. Could not appreciate right dorsalis pedis pulse by palpation or Doppler. Pulses in the left foot are palpated and dopplerable. Both feet are warm.  Capillary refill to the skin is less than 3 seconds in the right foot.  4th toe on the right is firm, black and nonpainful. Half of the fifth toe on the right is also black firm but this  is more tender with tenderness extending along the  metatarsal. SKIN: Warm, Dry, No erythema, No rash  ED Course  Procedures (including critical care time)  Labs Reviewed  CBC WITH DIFFERENTIAL - Abnormal; Notable for the following:    WBC 11.0 (*)     RBC 4.03 (*)     Hemoglobin 11.3 (*)     HCT 35.3 (*)     All other components within normal limits  BASIC METABOLIC PANEL - Abnormal; Notable for the following:    Potassium 5.5 (*)     Glucose, Bld 186 (*)     BUN 81 (*)     Creatinine, Ser 5.18 (*)     GFR calc non Af Amer 11 (*)     GFR calc Af Amer 13 (*)     All other components within normal limits  CBC  CREATININE, SERUM  URINALYSIS, ROUTINE W REFLEX MICROSCOPIC  RENAL FUNCTION PANEL   Dg Toe 4th Right  06/15/2012  *RADIOLOGY REPORT*  Clinical Data: Diabetes mellitus.  Chronic renal disease. Suspected 4,5 toe  gangrene.  RIGHT FOURTH TOE  Comparison: MRI 05/30/2012.  Findings: Diffuse soft tissue swelling.  Vascular calcification. No plain film findings of osteomyelitis.  No acute fracture or dislocation.  IMPRESSION: Diffuse soft tissue swelling.  No plain film findings of osteomyelitis.   Original Report Authenticated By: Davonna Belling, M.D.      1. Backache, unspecified   2. Hyperlipidemia LDL goal < 70   3. Hyperkalemia   4. Toe gangrene   5. Peripheral vascular disease   6. Diabetes mellitus       MDM  TAUREAN JU is a 57 y.o. male who presents with a dead fourth toe on the right and worsening likely dry gangrene of the fifth right toe. I do not think the patient has an acute embolus at this time causing occlusion-I. think this is been a chronic problem.    Labs were obtained for possible CT angioma of the leg, patient's kidney function has deteriorated since 12/23, his creatinine has elevated to 5.18. We'll start some gentle hydration.  Discussed with vascular surgery-he will see him while an inpatient, discuss with family medicine for admission for acute on chronic renal failure, mild hyperkalemia treated with  subcutaneous insulin and Kayexalate. Patient also receiving normal saline.   Patient stable.       Jones Skene, MD 06/16/12 [phone]

## 2012-06-17 ENCOUNTER — Inpatient Hospital Stay (HOSPITAL_COMMUNITY): Payer: Medicare Other

## 2012-06-17 DIAGNOSIS — I70269 Atherosclerosis of native arteries of extremities with gangrene, unspecified extremity: Secondary | ICD-10-CM

## 2012-06-17 LAB — CBC
HCT: 33.8 % — ABNORMAL LOW (ref 39.0–52.0)
MCV: 88.9 fL (ref 78.0–100.0)
RBC: 3.8 MIL/uL — ABNORMAL LOW (ref 4.22–5.81)
WBC: 8.9 10*3/uL (ref 4.0–10.5)

## 2012-06-17 LAB — BASIC METABOLIC PANEL
CO2: 19 mEq/L (ref 19–32)
CO2: 21 mEq/L (ref 19–32)
Chloride: 110 mEq/L (ref 96–112)
Chloride: 111 mEq/L (ref 96–112)
Creatinine, Ser: 4.01 mg/dL — ABNORMAL HIGH (ref 0.50–1.35)
Potassium: 5.2 mEq/L — ABNORMAL HIGH (ref 3.5–5.1)
Sodium: 143 mEq/L (ref 135–145)

## 2012-06-17 LAB — GLUCOSE, CAPILLARY
Glucose-Capillary: 70 mg/dL (ref 70–99)
Glucose-Capillary: 97 mg/dL (ref 70–99)

## 2012-06-17 MED ORDER — SODIUM POLYSTYRENE SULFONATE 15 GM/60ML PO SUSP
15.0000 g | Freq: Once | ORAL | Status: AC
  Administered 2012-06-17: 15 g via ORAL
  Filled 2012-06-17: qty 60

## 2012-06-17 MED ORDER — OXYCODONE-ACETAMINOPHEN 7.5-325 MG PO TABS: 1.0000 | ORAL_TABLET | Freq: Four times a day (QID) | ORAL | Status: DC | PRN

## 2012-06-17 MED ORDER — OXYCODONE-ACETAMINOPHEN 5-325 MG PO TABS
1.0000 | ORAL_TABLET | Freq: Four times a day (QID) | ORAL | Status: DC | PRN
  Administered 2012-06-17 – 2012-06-19 (×5): 1 via ORAL
  Filled 2012-06-17 (×5): qty 1

## 2012-06-17 MED ORDER — DEXTROSE 5 % IV SOLN
1.5000 g | INTRAVENOUS | Status: AC
  Administered 2012-06-18: 1.5 g via INTRAVENOUS
  Filled 2012-06-17: qty 1.5

## 2012-06-17 MED ORDER — OXYCODONE HCL 5 MG PO TABS
2.5000 mg | ORAL_TABLET | Freq: Four times a day (QID) | ORAL | Status: DC | PRN
  Administered 2012-06-17 – 2012-06-19 (×4): 2.5 mg via ORAL
  Filled 2012-06-17 (×4): qty 1

## 2012-06-17 NOTE — Progress Notes (Signed)
FMTS Attending Note Patient seen and examined by me, discussed with resident team and I agree with Dr Patsey Berthold assess/plan.  Patient is comfortable; seen just after returning from MRI. Appreciate Dr Adele Dan plan for revascularization and Right 4th and 5th toe amputation; avoidance of contrast due to worsening renal disease.  Treatment of hyperkalemia with kayexalate. Paula Compton, MD

## 2012-06-17 NOTE — Progress Notes (Signed)
Family Medicine Teaching Service Daily Progress Note Service Page: [phone]  Subjective:  Reports pain - R shoulder, uncontrolled by current regimen. Going for MRI/MRA today.  Objective: Temp:  [97.9 F (36.6 C)-99 F (37.2 C)] 99 F (37.2 C) (01/09 4782) Pulse Rate:  [81-95] 81  (01/09 0613) Resp:  [18] 18  (01/09 0613) BP: (145-153)/(60-68) 145/60 mmHg (01/09 0613) SpO2:  [94 %-96 %] 94 % (01/09 0613) Weight:  [186 lb (84.369 kg)] 186 lb (84.369 kg) (01/08 2030)   Intake/Output Summary (Last 24 hours) at 06/17/12 0931 Last data filed at 06/17/12 0659  Gross per 24 hour  Intake   1680 ml  Output   1000 ml  Net    680 ml   Physical Exam: General: resting comfortably in bed. NAD. Cardiovascular: RRR. No murmurs, rubs, or gallops. Respiratory: CTAB. No rales, rhonchi, or wheezing. Abdomen: soft, nontender, nondistended. No organomegaly appreciated. Extremities: Necrotic 4th and 5th digit, right foot. DP and PT pulses not palpable. Neuro: No focal deficits.  CBC BMET   Lab 06/17/12 0625 06/16/12 0514 06/15/12 2356  WBC 8.9 9.8 11.0*  HGB 10.6* 12.0* 11.3*  HCT 33.8* 36.7* 35.3*  PLT 209 224 216    Lab 06/17/12 0625 06/16/12 0858 06/16/12 0514 06/15/12 2356  NA 143 139 -- 136  K 5.8* 5.2* -- 5.5*  CL 110 105 -- 101  CO2 19 21 -- 21  BUN 73* 82* -- 81*  CREATININE 4.29* 4.89* 5.01* --  GLUCOSE 77 174* -- 186*  CALCIUM 8.4 8.7 -- 8.9     Imaging/Diagnostic Tests:  Lower extremity Arterial Duplex Summary: Probableaorto-iliac disease, with severe iliac disease on right and moderately severe on left. The right common femoral artery is patent with monophasic flow. The left common femoral artery appears to be stenotic with elevated velocities. The right femoral artery appears to be occluded in its mid segment with multi-collateral flow supplying the distal femoral artery and popliteal artery. The left femoral artery appears to be occluded just past its origin  with multi-collateral flow supplying the distal femoral artery and popliteal artery. There is three vessel runoff bilaterally. There is possible moderate disease of the right anterior tibial artery and proximal left peroneal artery. The right external iliac artery demonstrates severely dampened monophasic flow. The left external iliac artery exhibits monophasic flow. Unable to visualize bilateral common iliac arteries and aorta.  ABI: Unable to insonate bilateral dorsalis pedis arteries, as well as the right posterior tibial artery. The left ABI=0.34, which is indicative of severe arterial insufficiency. Unable to calculate right ABI.  US Renal 06/16/2012  *RADIOLOGY REPORT*  Clinical Data: Injury, pain.  RENAL/URINARY TRACT ULTRASOUND COMPLETE  Comparison:  Renal ultrasound 01/28/2008 and CT abdomen and pelvis 10/19/2005.  Findings:  Right Kidney:  Measures 11.0 cm.  Cortical echogenicity is somewhat increased.  There is a 1.2 cm cyst in the lower pole.  No stone or hydronephrosis.  Left Kidney:  Measures 11.1 cm.  There is some increased cortical echogenicity.  No stone or hydronephrosis.  A 2.1 x 2.2 x 2.0 cm cyst is seen in the mid pole with a single thin septation.  There is also a simple 2.4 x 2.3 x 1.8 cm cyst in the upper pole.  No stone or hydronephrosis.  Bladder:  Decompressed with Foley catheter in place.  IMPRESSION:  1.  Negative for hydronephrosis or acute abnormality. 2.  Increased cortical echogenicity compatible with medical renal disease. 3.  Bilateral renal cysts as described.  Dg Toe 4th Right 06/15/2012  *RADIOLOGY REPORT*  Clinical Data: Diabetes mellitus.  Chronic renal disease. Suspected 4,5 toe  gangrene.  RIGHT FOURTH TOE  Comparison: MRI 05/30/2012.  Findings: Diffuse soft tissue swelling.  Vascular calcification. No plain film findings of osteomyelitis.  No acute fracture or dislocation.  IMPRESSION: Diffuse soft tissue swelling.  No plain film findings of  osteomyelitis.    Assessment/Plan: John [name] is a 57 y.o. 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   # Ischemic Necrosis of Toes - No evidence of infection on exam, patient with known PVD and already established with Dr. Edilia Bo - Lower Extremity Arterial Duplex obtained - Moderate to severe multi-level disease is exhibited, consistent with rest pain in right leg and claudication in left leg. - Vascular surgery following and we greatly appreciate their help in managing this patient.  Patient will need amputation and revascularization.   - MRI/MRA today.  # Acute on chronic kidney disease - Patient has CKD stage 4 (baseline Creatinine 3.8) and had acute rise in creatinine to 5.18 on admission. - Renal following and we greatly appreciate their help.  Patient will need placement of AVF during hospitalization. - Creatinine improving with IV fluids - 4.29 today.  Will continue to monitor closely and follow up on Renal recommendations.   #Hyperkalemia - 5.8 today.  - Will continue to follow closely.  # Prostatic Hyperplasia - Flomax daily  #DM  - SSI and Lantus 20 Units - Good control at this time -  CBG's 70-174  # HTN  - Cont amlodipine 10 mg, metoprolol succinate 25 mg   FENGI: regular diet, NS @ 100,  PPX: Heparin sub Q  DISPO: Patient to undergo revascularization and amputation as well as placement of AVF. CODE STATUS: full code  Everlene Other, DO 06/17/2012, 9:31 AM

## 2012-06-17 NOTE — Progress Notes (Signed)
[facility] ROUNDING NOTE   Subjective:   Interval History: appears comfortable  Objective:  Vital signs in last 24 hours:  Temp:  [97.9 F (36.6 C)-99 F (37.2 C)] 99 F (37.2 C) (01/09 1610) Pulse Rate:  [81-95] 81  (01/09 0613) Resp:  [18] 18  (01/09 0613) BP: (145-153)/(60-68) 145/60 mmHg (01/09 0613) SpO2:  [94 %-96 %] 94 % (01/09 0613) Weight:  [84.369 kg (186 lb)] 84.369 kg (186 lb) (01/08 2030)  Weight change:  Filed Weights   06/16/12 2030  Weight: 84.369 kg (186 lb)    Intake/Output: I/O last 3 completed shifts: In: 1680 [P.O.:480; I.V.:1200] Out: 2000 [Urine:2000]   Intake/Output this shift:  Total I/O In: 120 [P.O.:120] Out: -     Cardiovascular: RRR. Respiratory: CTAB.  Abdomen: soft, nontender, nondistended.  Extremities: Necrotic 4th and 5th digit, right foot. DP and PT pulses not palpable.  Neuro: No focal deficits.    Basic Metabolic Panel:  Lab 06/17/12 9604 06/16/12 0858 06/16/12 0514 06/15/12 2356  NA 143 139 -- 136  K 5.8* 5.2* -- 5.5*  CL 110 105 -- 101  CO2 19 21 -- 21  GLUCOSE 77 174* -- 186*  BUN 73* 82* -- 81*  CREATININE 4.29* 4.89* 5.01* 5.18*  CALCIUM 8.4 8.7 -- 8.9  MG -- -- -- --  PHOS -- 5.1* -- --    Liver Function Tests:  Lab 06/16/12 0858  AST --  ALT --  ALKPHOS --  BILITOT --  PROT --  ALBUMIN 3.3*   No results found for this basename: LIPASE:5,AMYLASE:5 in the last 168 hours No results found for this basename: AMMONIA:3 in the last 168 hours  CBC:  Lab 06/17/12 0625 06/16/12 0514 06/15/12 2356  WBC 8.9 9.8 11.0*  NEUTROABS -- -- 7.4  HGB 10.6* 12.0* 11.3*  HCT 33.8* 36.7* 35.3*  MCV 88.9 87.6 87.6  PLT 209 224 216    Cardiac Enzymes: No results found for this basename: CKTOTAL:5,CKMB:5,CKMBINDEX:5,TROPONINI:5 in the last 168 hours  BNP: No components found with this basename: POCBNP:5  CBG:  Lab 06/17/12 1056 06/17/12 0647 06/16/12 2140 06/16/12 1645 06/16/12 1130  GLUCAP  77 70 108* 106* 174*    Microbiology: Results for orders placed during the hospital encounter of 12/10/11  SURGICAL PCR SCREEN     Status: Normal   Collection Time   12/10/11  9:31 AM      Component Value Range Status Comment   MRSA, PCR NEGATIVE  NEGATIVE Final    Staphylococcus aureus NEGATIVE  NEGATIVE Final     Coagulation Studies: No results found for this basename: LABPROT:5,INR:5 in the last 72 hours  Urinalysis:  Basename 06/16/12 0520  COLORURINE YELLOW  LABSPEC 1.025  PHURINE 5.5  GLUCOSEU 100*  HGBUR TRACE*  BILIRUBINUR NEGATIVE  KETONESUR NEGATIVE  PROTEINUR 100*  UROBILINOGEN 0.2  NITRITE NEGATIVE  LEUKOCYTESUR NEGATIVE      Imaging: Mr Maxine Glenn Pelvis Wo Contrast  06/17/2012  *RADIOLOGY REPORT*  Clinical Data:  Right toe gangrene, renal insufficiency, nonpalpable femoral pulses.   MRA ABDOMEN, PELVIS, BILATERAL LOWER EXTREMITIES WITHOUT CONTRAST  Technique:  Angiographic images of the abdomen were obtained using MRA technique without intravenous contrast.  Contrast:   None  Arterial findings: Aorta:                  Mild atheromatous irregularity in its proximal segment.  There is signal loss just above the bifurcation suggesting   short segment occlusion or high-grade stenosis.  Celiac axis:            Patent  Superior mesenteric:Patent  Left renal:             Single, grossly patent.  Right renal:            Single, patent  Inferior mesenteric:Diminutive, patent proximally.  Left iliac:             Signal loss at its origin suggesting a short segment occlusion or high-grade stenosis.  The distal common iliac through the external iliac is   patent with atheromatous irregularity, no high-grade stenosis.  Right iliac:            Signal loss at the origin of the common iliac extending over a short segment consistent with high-grade stenosis or short segment origin occlusion.  The mid and distal common iliac and external iliac arteries are patent, somewhat diminutive in caliber  without focal stenosis.  Left lower extremity:  There is moderate stenosis in the common femoral artery.  Poor signal through the length of the SFA suggesting long segment/tandem significant stenoses or occlusion. Profunda femoral branches patent and there is collateral reconstitution adductor hiatus, with patency of the popliteal artery.  There is contiguous mildly diseased anterior tibial runoff across the ankle.  Tibioperoneal trunk is patent.  Peroneal artery occludes in the midcalf.  Positive tibial artery is diminutive, not seen below the lower calf.  Right lower extremity:  Common femoral artery diminutive, patent. Profunda femoris branches patent.  Long segment occlusion in the SFA with collateral reconstitution of the proximal popliteal artery just beyond the adductor hiatus, patent popliteal artery distally. Anterior tibial artery is occluded near its origin.  The tibial peroneal trunk is patent with contiguous mildly diseased posterior tibial runoff across the ankle.  The peroneal artery   is diseased, but also appears patent distally with small anterior and posterior communicating branches providing flow across the ankle.  Venous findings:  Dedicated venous phase imaging was not obtained.   Review of the MIP images confirms the above findings.  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  IMPRESSION:  1.  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 2.  Bilateral SFA occlusions with popliteal reconstitution. 3.  Contiguous left anterior tibial runoff to the foot. 4.  Diseased right posterior tibial and peroneal runoff across the right ankle.   Original Report Authenticated By: D. Andria Rhein, MD    Mr Maxine Glenn Abdomen Wo Contrast  06/17/2012  *RADIOLOGY REPORT*  Clinical Data:  Right toe gangrene, renal insufficiency, nonpalpable femoral pulses.    MRA ABDOMEN, PELVIS, BILATERAL LOWER EXTREMITIES WITHOUT CONTRAST  Technique:  Angiographic images of the abdomen were obtained using MRA technique without intravenous contrast.  Contrast:   None  Arterial findings: Aorta:                  Mild atheromatous irregularity in its proximal segment.  There is signal loss just above the bifurcation suggesting   short segment occlusion or high-grade stenosis.  Celiac axis:            Patent  Superior mesenteric:Patent  Left renal:             Single, grossly patent.  Right renal:            Single, patent  Inferior mesenteric:Diminutive, patent proximally.  Left iliac:  Signal loss at its origin suggesting a short segment occlusion or high-grade stenosis.  The distal common iliac through the external iliac is   patent with atheromatous irregularity, no high-grade stenosis.  Right iliac:            Signal loss at the origin of the common iliac extending over a short segment consistent with high-grade stenosis or short segment origin occlusion.  The mid and distal common iliac and external iliac arteries are patent, somewhat diminutive in caliber without focal stenosis.  Left lower extremity:  There is moderate stenosis in the common femoral artery.  Poor signal through the length of the SFA suggesting long segment/tandem significant stenoses or occlusion. Profunda femoral branches patent and there is collateral reconstitution adductor hiatus, with patency of the popliteal artery.  There is contiguous mildly diseased anterior tibial runoff across the ankle.  Tibioperoneal trunk is patent.  Peroneal artery occludes in the midcalf.  Positive tibial artery is diminutive, not seen below the lower calf.  Right lower extremity:  Common femoral artery diminutive, patent. Profunda femoris branches patent.  Long segment occlusion in the SFA with collateral reconstitution of the proximal popliteal artery just beyond the adductor hiatus, patent popliteal artery distally.  Anterior tibial artery is occluded near its origin.  The tibial peroneal trunk is patent with contiguous mildly diseased posterior tibial runoff across the ankle.  The peroneal artery   is diseased, but also appears patent distally with small anterior and posterior communicating branches providing flow across the ankle.  Venous findings:  Dedicated venous phase imaging was not obtained.   Review of the MIP images confirms the above findings.  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  IMPRESSION:  1.  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 2.  Bilateral SFA occlusions with popliteal reconstitution. 3.  Contiguous left anterior tibial runoff to the foot. 4.  Diseased right posterior tibial and peroneal runoff across the right ankle.   Original Report Authenticated By: D. Andria Rhein, MD    US Renal  06/16/2012  *RADIOLOGY REPORT*  Clinical Data: Injury, pain.  RENAL/URINARY TRACT ULTRASOUND COMPLETE  Comparison:  Renal ultrasound 01/28/2008 and CT abdomen and pelvis 10/19/2005.  Findings:  Right Kidney:  Measures 11.0 cm.  Cortical echogenicity is somewhat increased.  There is a 1.2 cm cyst in the lower pole.  No stone or hydronephrosis.  Left Kidney:  Measures 11.1 cm.  There is some increased cortical echogenicity.  No stone or hydronephrosis.  A 2.1 x 2.2 x 2.0 cm cyst is seen in the mid pole with a single thin septation.  There is also a simple 2.4 x 2.3 x 1.8 cm cyst in the upper pole.  No stone or hydronephrosis.  Bladder:  Decompressed with Foley catheter in place.  IMPRESSION:  1.  Negative for hydronephrosis or acute abnormality. 2.  Increased cortical echogenicity compatible with medical renal disease. 3.  Bilateral renal cysts as described.   Original Report Authenticated By: Holley Dexter, M.D.    Mr Mra Ext Low Left W/o  Cm  06/17/2012  *RADIOLOGY REPORT*  Clinical Data:  Right toe gangrene, renal insufficiency, nonpalpable femoral pulses.   MRA ABDOMEN, PELVIS, BILATERAL LOWER EXTREMITIES WITHOUT CONTRAST  Technique:  Angiographic images of the abdomen were obtained using MRA technique without intravenous contrast.  Contrast:   None  Arterial findings: Aorta:  Mild atheromatous irregularity in its proximal segment.  There is signal loss just above the bifurcation suggesting   short segment occlusion or high-grade stenosis.  Celiac axis:            Patent  Superior mesenteric:Patent  Left renal:             Single, grossly patent.  Right renal:            Single, patent  Inferior mesenteric:Diminutive, patent proximally.  Left iliac:             Signal loss at its origin suggesting a short segment occlusion or high-grade stenosis.  The distal common iliac through the external iliac is   patent with atheromatous irregularity, no high-grade stenosis.  Right iliac:            Signal loss at the origin of the common iliac extending over a short segment consistent with high-grade stenosis or short segment origin occlusion.  The mid and distal common iliac and external iliac arteries are patent, somewhat diminutive in caliber without focal stenosis.  Left lower extremity:  There is moderate stenosis in the common femoral artery.  Poor signal through the length of the SFA suggesting long segment/tandem significant stenoses or occlusion. Profunda femoral branches patent and there is collateral reconstitution adductor hiatus, with patency of the popliteal artery.  There is contiguous mildly diseased anterior tibial runoff across the ankle.  Tibioperoneal trunk is patent.  Peroneal artery occludes in the midcalf.  Positive tibial artery is diminutive, not seen below the lower calf.  Right lower extremity:  Common femoral artery diminutive, patent. Profunda femoris branches patent.  Long segment occlusion in the SFA with  collateral reconstitution of the proximal popliteal artery just beyond the adductor hiatus, patent popliteal artery distally. Anterior tibial artery is occluded near its origin.  The tibial peroneal trunk is patent with contiguous mildly diseased posterior tibial runoff across the ankle.  The peroneal artery   is diseased, but also appears patent distally with small anterior and posterior communicating branches providing flow across the ankle.  Venous findings:  Dedicated venous phase imaging was not obtained.   Review of the MIP images confirms the above findings.  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  IMPRESSION:  1.  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 2.  Bilateral SFA occlusions with popliteal reconstitution. 3.  Contiguous left anterior tibial runoff to the foot. 4.  Diseased right posterior tibial and peroneal runoff across the right ankle.   Original Report Authenticated By: D. Andria Rhein, MD    Mr Maxine Glenn Ext Low Left W/o Cm  06/17/2012  *RADIOLOGY REPORT*  Clinical Data:  Right toe gangrene, renal insufficiency, nonpalpable femoral pulses.   MRA ABDOMEN, PELVIS, BILATERAL LOWER EXTREMITIES WITHOUT CONTRAST  Technique:  Angiographic images of the abdomen were obtained using MRA technique without intravenous contrast.  Contrast:   None  Arterial findings: Aorta:                  Mild atheromatous irregularity in its proximal segment.  There is signal loss just above the bifurcation suggesting   short segment occlusion or high-grade stenosis.  Celiac axis:            Patent  Superior mesenteric:Patent  Left renal:             Single, grossly patent.  Right renal:            Single, patent  Inferior mesenteric:Diminutive, patent proximally.  Left iliac:             Signal loss at its origin suggesting a short segment occlusion or  high-grade stenosis.  The distal common iliac through the external iliac is   patent with atheromatous irregularity, no high-grade stenosis.  Right iliac:            Signal loss at the origin of the common iliac extending over a short segment consistent with high-grade stenosis or short segment origin occlusion.  The mid and distal common iliac and external iliac arteries are patent, somewhat diminutive in caliber without focal stenosis.  Left lower extremity:  There is moderate stenosis in the common femoral artery.  Poor signal through the length of the SFA suggesting long segment/tandem significant stenoses or occlusion. Profunda femoral branches patent and there is collateral reconstitution adductor hiatus, with patency of the popliteal artery.  There is contiguous mildly diseased anterior tibial runoff across the ankle.  Tibioperoneal trunk is patent.  Peroneal artery occludes in the midcalf.  Positive tibial artery is diminutive, not seen below the lower calf.  Right lower extremity:  Common femoral artery diminutive, patent. Profunda femoris branches patent.  Long segment occlusion in the SFA with collateral reconstitution of the proximal popliteal artery just beyond the adductor hiatus, patent popliteal artery distally. Anterior tibial artery is occluded near its origin.  The tibial peroneal trunk is patent with contiguous mildly diseased posterior tibial runoff across the ankle.  The peroneal artery   is diseased, but also appears patent distally with small anterior and posterior communicating branches providing flow across the ankle.  Venous findings:  Dedicated venous phase imaging was not obtained.   Review of the MIP images confirms the above findings.  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  IMPRESSION:  1.  High-grade stenosis or a short segment occlusion at the aortic bifurcation extending a short segment into bilateral proximal  common iliac arteries. 2.  Bilateral SFA occlusions with popliteal reconstitution. 3.  Contiguous left anterior tibial runoff to the foot. 4.  Diseased right posterior tibial and peroneal runoff across the right ankle.   Original Report Authenticated By: D. Andria Rhein, MD    Dg Toe 4th Right  06/15/2012  *RADIOLOGY REPORT*  Clinical Data: Diabetes mellitus.  Chronic renal disease. Suspected 4,5 toe  gangrene.  RIGHT FOURTH TOE  Comparison: MRI 05/30/2012.  Findings: Diffuse soft tissue swelling.  Vascular calcification. No plain film findings of osteomyelitis.  No acute fracture or dislocation.  IMPRESSION: Diffuse soft tissue swelling.  No plain film findings of osteomyelitis.   Original Report Authenticated By: Davonna Belling, M.D.      Medications:      . sodium chloride 100 mL/hr at 06/16/12 1617      . amLODipine  10 mg Oral Daily  . aspirin  81 mg Oral Daily  . baclofen  10 mg Oral BID  . docusate sodium  100 mg Oral BID  . heparin  5,000 Units Subcutaneous Q8H  . insulin aspart  0-15 Units Subcutaneous TID WC  . insulin aspart  0-5 Units Subcutaneous QHS  . insulin glargine  20 Units Subcutaneous Daily  . metoprolol succinate  25 mg Oral Daily  . nortriptyline  10 mg Oral QHS  . simvastatin  20 mg Oral q1800  . 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 Would recommend placement of vascular access.  HTN/Volume controlled  Anemia  Goal  Hyperkalemia low K diet and kayexalate   LOS: 2 John [name] W @TODAY @12 :58 PM

## 2012-06-17 NOTE — Progress Notes (Signed)
VASCULAR PROGRESS NOTE  SUBJECTIVE: No specific complaints. Minimal pain right foot.   PHYSICAL EXAM: Filed Vitals:   06/16/12 1649 06/16/12 2024 06/16/12 2030 06/17/12 0613  BP: 153/68 146/64  145/60  Pulse: 95 85  81  Temp: 97.9 F (36.6 C) 98.9 F (37.2 C)  99 F (37.2 C)  TempSrc:  Oral  Oral  Resp: 18 18  18   Height:   5\' 11"  (1.803 m)   Weight:   186 lb (84.369 kg)   SpO2: 95% 96%  94%   Lungs: clear Vascular Exam: No right femoral pulse. Slightly diminished left femoral pulse. Palpable brachial pulses.  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 No erythema or drainage.    LABS: Lab Results  Component Value Date   WBC 8.9 06/17/2012   HGB 10.6* 06/17/2012   HCT 33.8* 06/17/2012   MCV 88.9 06/17/2012   PLT 209 06/17/2012   Lab Results  Component Value Date   CREATININE 4.29* 06/17/2012   Lab Results  Component Value Date   INR 0.98 12/10/2011   CBG (last 3)   Basename 06/17/12 1056 06/17/12 0647 06/16/12 2140  GLUCAP 77 70 108*   REVIEW: Patient was initially eval by Dr Darrick Penna in 2010 with PVD, but he refused angio at that time. I saw him in June of 2013 concerning a wound on his right great toe which ultimately healed. He had a toe pressure of 36 mm Hg and mild renal insufficiency at that time.  (He was also found to have a right carotid bruit at that time and was found to have a critical right carotid stenosis. He underwent R CEA on 12/18/2011).   Dr Darrick Penna performed a CO2 arteriogram on 11/21/11 which showed some distal aortic and iliac artery occlusive disease bilat, but images were not ideal because study was done with CO2 to prevent risk of worsening renal insufficiency. The right SFA was occluded with reconstitution of the above knee popliteal artery and 2 vessel runoff via the right posterior tibial and peroneal arteries. We were going to consider a right fempop bypass he developed any recurrent ulcerations on the right foot. Vein map then showed a marginal right GSV. I last  saw him on 01/07/12.   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 He has had significant progression of his iliac disease and has no inflow on the right. His renal insufficiency has also progressed. He continues to smoke tobacco. He denies any history of MI, CHF, or cardiac disease or symptoms.  MRI shows aortoiliac occlusive disease and a right SFA occlusion.   ASSESSMENT/PLAN: This is a complicated vascular problem. 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 Without revascularization, toe amps will not heal and wound will not heal. He would likely require a right AKA. The situation is complicated by his renal insufficiency, which has limited what we can do for a vascular workup.   The only chance for limb salvage is attempted revascularization and amputation of the 4th and 5th toes. One option would be arteriography and attempt to address the iliac disease with angioplasty and do a right fem pop. However, he appears to have distal aortic disease on MRI and Dr. Hyman Hopes agrees that we should avoid any contrast. I do not think we could get adequate visualization for PTA/ stenting with CO2 alone. All things considered, I think that the best option is right  Axillo-femoral bypass and right fempop bypass in addition to amputation of the 2 toes. He does have a left femoral pulse I do not think that this would provide adequate inflow for a fem-fem bypass. Also, I would not favor ax bifem bypass as the pulse on the left is reasonable and competitive flow would increase the risk of thrombosis of a fem fem graft. Vein map previously showed marginal vein, but will explore intra op and hope to do fem pop with vein. Will review MRI with radiology to help determine best distal target (ie AK or BK pop).  I have reviewed the indications for bypass. I have also reviewed the potential complications of surgery including but not limited to:  wound healing problems, infection, graft thrombosis, limb loss, or other unpredictable medical problems. All the patient's questions were answered and he is  agreeable to proceed. I am trying to get this scheduled for tomorrow, if possible.   Cari Caraway Beeper: [phone] 06/17/2012

## 2012-06-18 ENCOUNTER — Telehealth: Payer: Self-pay | Admitting: Vascular Surgery

## 2012-06-18 ENCOUNTER — Encounter (HOSPITAL_COMMUNITY): Payer: Self-pay | Admitting: Anesthesiology

## 2012-06-18 ENCOUNTER — Encounter (HOSPITAL_COMMUNITY): Payer: Self-pay | Admitting: Certified Registered Nurse Anesthetist

## 2012-06-18 ENCOUNTER — Encounter (HOSPITAL_COMMUNITY)
Admission: EM | Disposition: A | Discharge status: Skilled Nursing Facility | Payer: Self-pay | Source: Home / Self Care | Attending: Family Medicine

## 2012-06-18 ENCOUNTER — Inpatient Hospital Stay (HOSPITAL_COMMUNITY): Payer: Medicare Other | Admitting: Anesthesiology

## 2012-06-18 HISTORY — PX: AMPUTATION: SHX166

## 2012-06-18 HISTORY — PX: FEMORAL-POPLITEAL BYPASS GRAFT: SHX937

## 2012-06-18 HISTORY — PX: AXILLARY-FEMORAL BYPASS GRAFT: SHX894

## 2012-06-18 LAB — CBC
HCT: 30.9 % — ABNORMAL LOW (ref 39.0–52.0)
MCV: 88.8 fL (ref 78.0–100.0)
Platelets: 199 10*3/uL (ref 150–400)
RBC: 3.48 MIL/uL — ABNORMAL LOW (ref 4.22–5.81)
WBC: 9.1 10*3/uL (ref 4.0–10.5)

## 2012-06-18 LAB — GLUCOSE, CAPILLARY
Glucose-Capillary: 129 mg/dL — ABNORMAL HIGH (ref 70–99)
Glucose-Capillary: 117 mg/dL — ABNORMAL HIGH (ref 70–99)

## 2012-06-18 LAB — SURGICAL PCR SCREEN: Staphylococcus aureus: NEGATIVE

## 2012-06-18 LAB — BASIC METABOLIC PANEL
CO2: 21 mEq/L (ref 19–32)
Chloride: 114 mEq/L — ABNORMAL HIGH (ref 96–112)
Sodium: 146 mEq/L — ABNORMAL HIGH (ref 135–145)

## 2012-06-18 SURGERY — CREATION, BYPASS, ARTERIAL, AXILLARY TO BILATERAL FEMORAL, USING GRAFT
Anesthesia: General | Site: Toe | Laterality: Right | Wound class: Clean

## 2012-06-18 MED ORDER — MIDAZOLAM HCL 2 MG/2ML IJ SOLN: 0.5000 mg | Freq: Once | INTRAMUSCULAR | Status: DC | PRN

## 2012-06-18 MED ORDER — BACITRACIN ZINC 500 UNIT/GM EX OINT
TOPICAL_OINTMENT | CUTANEOUS | Status: DC | PRN
  Administered 2012-06-18: 1 via TOPICAL

## 2012-06-18 MED ORDER — FENTANYL CITRATE 0.05 MG/ML IJ SOLN: 25.0000 ug | INTRAMUSCULAR | Status: DC | PRN

## 2012-06-18 MED ORDER — THROMBIN 20000 UNITS EX SOLR
CUTANEOUS | Status: AC
  Filled 2012-06-18: qty 20000

## 2012-06-18 MED ORDER — LIDOCAINE HCL (CARDIAC) 20 MG/ML IV SOLN
INTRAVENOUS | Status: DC | PRN
  Administered 2012-06-18: 20 mg via INTRAVENOUS

## 2012-06-18 MED ORDER — ARTIFICIAL TEARS OP OINT
TOPICAL_OINTMENT | OPHTHALMIC | Status: DC | PRN
  Administered 2012-06-18: 1 via OPHTHALMIC

## 2012-06-18 MED ORDER — OXYCODONE HCL 5 MG PO TABS: 5.0000 mg | ORAL_TABLET | Freq: Once | ORAL | Status: DC | PRN

## 2012-06-18 MED ORDER — MEPERIDINE HCL 25 MG/ML IJ SOLN: 6.2500 mg | INTRAMUSCULAR | Status: DC | PRN

## 2012-06-18 MED ORDER — ACETAMINOPHEN 650 MG RE SUPP: 325.0000 mg | RECTAL | Status: DC | PRN

## 2012-06-18 MED ORDER — PHENYLEPHRINE HCL 10 MG/ML IJ SOLN
INTRAMUSCULAR | Status: DC | PRN
  Administered 2012-06-18 (×3): 40 ug via INTRAVENOUS
  Administered 2012-06-18 (×2): 120 ug via INTRAVENOUS
  Administered 2012-06-18 (×4): 80 ug via INTRAVENOUS

## 2012-06-18 MED ORDER — THROMBIN 20000 UNITS EX SOLR
CUTANEOUS | Status: DC | PRN
  Administered 2012-06-18: 12:00:00 via TOPICAL

## 2012-06-18 MED ORDER — PHENOL 1.4 % MT LIQD: 1.0000 | OROMUCOSAL | Status: DC | PRN

## 2012-06-18 MED ORDER — ALBUMIN HUMAN 5 % IV SOLN
INTRAVENOUS | Status: DC | PRN
  Administered 2012-06-18: 12:00:00 via INTRAVENOUS

## 2012-06-18 MED ORDER — 0.9 % SODIUM CHLORIDE (POUR BTL) OPTIME
TOPICAL | Status: DC | PRN
  Administered 2012-06-18 (×2): 1000 mL

## 2012-06-18 MED ORDER — NEOSTIGMINE METHYLSULFATE 1 MG/ML IJ SOLN
INTRAMUSCULAR | Status: DC | PRN
  Administered 2012-06-18: 5 mg via INTRAVENOUS

## 2012-06-18 MED ORDER — POTASSIUM CHLORIDE CRYS ER 20 MEQ PO TBCR: 20.0000 meq | EXTENDED_RELEASE_TABLET | Freq: Once | ORAL | Status: AC | PRN

## 2012-06-18 MED ORDER — GUAIFENESIN-DM 100-10 MG/5ML PO SYRP: 15.0000 mL | ORAL_SOLUTION | ORAL | Status: DC | PRN

## 2012-06-18 MED ORDER — LACTATED RINGERS IV SOLN
INTRAVENOUS | Status: DC
  Administered 2012-06-18 – 2012-06-19 (×2): via INTRAVENOUS

## 2012-06-18 MED ORDER — BACITRACIN ZINC 500 UNIT/GM EX OINT
TOPICAL_OINTMENT | CUTANEOUS | Status: AC
  Filled 2012-06-18: qty 15

## 2012-06-18 MED ORDER — METOPROLOL TARTRATE 1 MG/ML IV SOLN: 2.0000 mg | INTRAVENOUS | Status: DC | PRN

## 2012-06-18 MED ORDER — EPHEDRINE SULFATE 50 MG/ML IJ SOLN
INTRAMUSCULAR | Status: DC | PRN
  Administered 2012-06-18 (×2): 10 mg via INTRAVENOUS

## 2012-06-18 MED ORDER — SODIUM CHLORIDE 0.9 % IV SOLN: 500.0000 mL | Freq: Once | INTRAVENOUS | Status: AC | PRN

## 2012-06-18 MED ORDER — PANTOPRAZOLE SODIUM 40 MG PO TBEC
40.0000 mg | DELAYED_RELEASE_TABLET | Freq: Every day | ORAL | Status: DC
  Administered 2012-06-18 – 2012-06-23 (×6): 40 mg via ORAL
  Filled 2012-06-18 (×6): qty 1

## 2012-06-18 MED ORDER — FENTANYL CITRATE 0.05 MG/ML IJ SOLN
INTRAMUSCULAR | Status: DC | PRN
  Administered 2012-06-18: 50 ug via INTRAVENOUS
  Administered 2012-06-18: 150 ug via INTRAVENOUS
  Administered 2012-06-18: 50 ug via INTRAVENOUS

## 2012-06-18 MED ORDER — LABETALOL HCL 5 MG/ML IV SOLN
10.0000 mg | INTRAVENOUS | Status: DC | PRN
  Filled 2012-06-18: qty 4

## 2012-06-18 MED ORDER — MORPHINE SULFATE 2 MG/ML IJ SOLN
2.0000 mg | INTRAMUSCULAR | Status: DC | PRN
  Administered 2012-06-18: 2 mg via INTRAVENOUS
  Administered 2012-06-18 – 2012-06-19 (×5): 4 mg via INTRAVENOUS
  Filled 2012-06-18 (×2): qty 2
  Filled 2012-06-18: qty 1
  Filled 2012-06-18 (×3): qty 2

## 2012-06-18 MED ORDER — PROMETHAZINE HCL 25 MG/ML IJ SOLN: 6.2500 mg | INTRAMUSCULAR | Status: DC | PRN

## 2012-06-18 MED ORDER — HYDRALAZINE HCL 20 MG/ML IJ SOLN
10.0000 mg | INTRAMUSCULAR | Status: DC | PRN
  Administered 2012-06-21: 10 mg via INTRAVENOUS
  Filled 2012-06-18: qty 0.5

## 2012-06-18 MED ORDER — HEPARIN SODIUM (PORCINE) 5000 UNIT/ML IJ SOLN
5000.0000 [IU] | Freq: Three times a day (TID) | INTRAMUSCULAR | Status: DC
  Administered 2012-06-19 – 2012-06-24 (×15): 5000 [IU] via SUBCUTANEOUS
  Filled 2012-06-18 (×19): qty 1

## 2012-06-18 MED ORDER — PROPOFOL 10 MG/ML IV BOLUS
INTRAVENOUS | Status: DC | PRN
  Administered 2012-06-18: 100 mg via INTRAVENOUS

## 2012-06-18 MED ORDER — ALUM & MAG HYDROXIDE-SIMETH 200-200-20 MG/5ML PO SUSP: 15.0000 mL | ORAL | Status: DC | PRN

## 2012-06-18 MED ORDER — ROCURONIUM BROMIDE 100 MG/10ML IV SOLN
INTRAVENOUS | Status: DC | PRN
  Administered 2012-06-18 (×2): 10 mg via INTRAVENOUS
  Administered 2012-06-18: 50 mg via INTRAVENOUS
  Administered 2012-06-18: 10 mg via INTRAVENOUS

## 2012-06-18 MED ORDER — SODIUM CHLORIDE 0.9 % IV SOLN
INTRAVENOUS | Status: DC | PRN
  Administered 2012-06-18: 10:00:00 via INTRAVENOUS

## 2012-06-18 MED ORDER — PIPERACILLIN-TAZOBACTAM 3.375 G IVPB
3.3750 g | Freq: Three times a day (TID) | INTRAVENOUS | Status: DC
  Administered 2012-06-18 – 2012-06-20 (×6): 3.375 g via INTRAVENOUS
  Filled 2012-06-18 (×10): qty 50

## 2012-06-18 MED ORDER — SODIUM CHLORIDE 0.9 % IR SOLN
Status: DC | PRN
  Administered 2012-06-18: 11:00:00

## 2012-06-18 MED ORDER — MIDAZOLAM HCL 5 MG/5ML IJ SOLN
INTRAMUSCULAR | Status: DC | PRN
  Administered 2012-06-18: 2 mg via INTRAVENOUS

## 2012-06-18 MED ORDER — OXYCODONE HCL 5 MG/5ML PO SOLN: 5.0000 mg | Freq: Once | ORAL | Status: DC | PRN

## 2012-06-18 MED ORDER — DOPAMINE-DEXTROSE 3.2-5 MG/ML-% IV SOLN: 3.0000 ug/kg/min | INTRAVENOUS | Status: DC

## 2012-06-18 MED ORDER — PROTAMINE SULFATE 10 MG/ML IV SOLN
INTRAVENOUS | Status: DC | PRN
  Administered 2012-06-18: 10 mg via INTRAVENOUS
  Administered 2012-06-18: 20 mg via INTRAVENOUS
  Administered 2012-06-18: 10 mg via INTRAVENOUS

## 2012-06-18 MED ORDER — GLYCOPYRROLATE 0.2 MG/ML IJ SOLN
INTRAMUSCULAR | Status: DC | PRN
  Administered 2012-06-18: .8 mg via INTRAVENOUS

## 2012-06-18 SURGICAL SUPPLY — 63 items
ADH SKN CLS APL DERMABOND .7 (GAUZE/BANDAGES/DRESSINGS) ×6
BANDAGE ELASTIC 4 VELCRO ST LF (GAUZE/BANDAGES/DRESSINGS) ×1 IMPLANT
BANDAGE ESMARK 6X9 LF (GAUZE/BANDAGES/DRESSINGS) IMPLANT
BANDAGE GAUZE ELAST BULKY 4 IN (GAUZE/BANDAGES/DRESSINGS) ×1 IMPLANT
BNDG CMPR 9X6 STRL LF SNTH (GAUZE/BANDAGES/DRESSINGS) ×2
BNDG ESMARK 6X9 LF (GAUZE/BANDAGES/DRESSINGS) ×3
CANISTER SUCTION 2500CC (MISCELLANEOUS) ×3 IMPLANT
CLIP TI MEDIUM 24 (CLIP) ×3 IMPLANT
CLIP TI WIDE RED SMALL 24 (CLIP) ×3 IMPLANT
CLOTH BEACON ORANGE TIMEOUT ST (SAFETY) ×3 IMPLANT
COVER SURGICAL LIGHT HANDLE (MISCELLANEOUS) ×3 IMPLANT
CUFF TOURNIQUET SINGLE 34IN LL (TOURNIQUET CUFF) ×1 IMPLANT
DERMABOND ADVANCED (GAUZE/BANDAGES/DRESSINGS) ×3
DERMABOND ADVANCED .7 DNX12 (GAUZE/BANDAGES/DRESSINGS) ×2 IMPLANT
DRAIN SNY 10X20 3/4 PERF (WOUND CARE) IMPLANT
DRAPE INCISE IOBAN 66X45 STRL (DRAPES) ×3 IMPLANT
DRAPE WARM FLUID 44X44 (DRAPE) ×3 IMPLANT
ELECT CAUTERY BLADE 6.4 (BLADE) ×1 IMPLANT
ELECT REM PT RETURN 9FT ADLT (ELECTROSURGICAL) ×3
ELECTRODE REM PT RTRN 9FT ADLT (ELECTROSURGICAL) ×2 IMPLANT
EVACUATOR SILICONE 100CC (DRAIN) IMPLANT
GAUZE SPONGE 4X4 16PLY XRAY LF (GAUZE/BANDAGES/DRESSINGS) ×1 IMPLANT
GLOVE BIO SURGEON STRL SZ 6.5 (GLOVE) ×2 IMPLANT
GLOVE BIO SURGEON STRL SZ7.5 (GLOVE) ×3 IMPLANT
GLOVE BIOGEL PI IND STRL 6.5 (GLOVE) IMPLANT
GLOVE BIOGEL PI IND STRL 7.0 (GLOVE) IMPLANT
GLOVE BIOGEL PI IND STRL 8 (GLOVE) ×2 IMPLANT
GLOVE BIOGEL PI INDICATOR 6.5 (GLOVE) ×5
GLOVE BIOGEL PI INDICATOR 7.0 (GLOVE) ×1
GLOVE BIOGEL PI INDICATOR 8 (GLOVE) ×2
GLOVE ECLIPSE 6.0 STRL STRAW (GLOVE) ×1 IMPLANT
GLOVE ECLIPSE 6.5 STRL STRAW (GLOVE) ×4 IMPLANT
GLOVE NEODERM STER SZ 7 (GLOVE) ×1 IMPLANT
GLOVE SS BIOGEL STRL SZ 7.5 (GLOVE) IMPLANT
GLOVE SUPERSENSE BIOGEL SZ 7.5 (GLOVE) ×1
GOWN STRL NON-REIN LRG LVL3 (GOWN DISPOSABLE) ×10 IMPLANT
GRAFT GORETEX (Vascular Products) ×1 IMPLANT
GRAFT PROPATEN THIN WALL 6X80 (Vascular Products) ×1 IMPLANT
INSERT FOGARTY SM (MISCELLANEOUS) ×1 IMPLANT
KIT BASIN OR (CUSTOM PROCEDURE TRAY) ×3 IMPLANT
KIT ROOM TURNOVER OR (KITS) ×3 IMPLANT
NS IRRIG 1000ML POUR BTL (IV SOLUTION) ×6 IMPLANT
PACK PERIPHERAL VASCULAR (CUSTOM PROCEDURE TRAY) ×3 IMPLANT
PAD ARMBOARD 7.5X6 YLW CONV (MISCELLANEOUS) ×6 IMPLANT
SOLUTION BETADINE 4OZ (MISCELLANEOUS) ×2 IMPLANT
SPONGE GAUZE 4X4 12PLY (GAUZE/BANDAGES/DRESSINGS) ×2 IMPLANT
SPONGE SURGIFOAM ABS GEL 100 (HEMOSTASIS) ×1 IMPLANT
SUT ETHILON 3 0 PS 1 (SUTURE) ×2 IMPLANT
SUT GORETEX 6.0 TT9 (SUTURE) ×2 IMPLANT
SUT PROLENE 5 0 C 1 24 (SUTURE) ×3 IMPLANT
SUT PROLENE 6 0 BV (SUTURE) ×4 IMPLANT
SUT PROLENE 6 0 CC (SUTURE) ×1 IMPLANT
SUT SILK 2 0 FS (SUTURE) ×3 IMPLANT
SUT SILK 3 0 (SUTURE) ×3
SUT SILK 3-0 18XBRD TIE 12 (SUTURE) IMPLANT
SUT VIC AB 2-0 CTB1 (SUTURE) ×7 IMPLANT
SUT VIC AB 3-0 SH 27 (SUTURE) ×9
SUT VIC AB 3-0 SH 27X BRD (SUTURE) ×4 IMPLANT
SUT VICRYL 4-0 PS2 18IN ABS (SUTURE) ×7 IMPLANT
TOWEL OR 17X24 6PK STRL BLUE (TOWEL DISPOSABLE) ×6 IMPLANT
TOWEL OR 17X26 10 PK STRL BLUE (TOWEL DISPOSABLE) ×7 IMPLANT
TRAY FOLEY CATH 14FRSI W/METER (CATHETERS) ×2 IMPLANT
WATER STERILE IRR 1000ML POUR (IV SOLUTION) ×3 IMPLANT

## 2012-06-18 NOTE — H&P (View-Only) (Signed)
VASCULAR PROGRESS NOTE  SUBJECTIVE: No specific complaints. Minimal pain right foot.   PHYSICAL EXAM: Filed Vitals:   06/16/12 1649 06/16/12 2024 06/16/12 2030 06/17/12 0613  BP: 153/68 146/64  145/60  Pulse: 95 85  81  Temp: 97.9 F (36.6 C) 98.9 F (37.2 C)  99 F (37.2 C)  TempSrc:  Oral  Oral  Resp: 18 18  18  Height:   5' 11" (1.803 m)   Weight:   186 lb (84.369 kg)   SpO2: 95% 96%  94%   Lungs: clear Vascular Exam: No right femoral pulse. Slightly diminished left femoral pulse. Palpable brachial pulses.  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 No erythema or drainage.    LABS: Lab Results  Component Value Date   WBC 8.9 06/17/2012   HGB 10.6* 06/17/2012   HCT 33.8* 06/17/2012   MCV 88.9 06/17/2012   PLT 209 06/17/2012   Lab Results  Component Value Date   CREATININE 4.29* 06/17/2012   Lab Results  Component Value Date   INR 0.98 12/10/2011   CBG (last 3)   Basename 06/17/12 1056 06/17/12 0647 06/16/12 2140  GLUCAP 77 70 108*   REVIEW: Patient was initially eval by Dr Fields in 2010 with PVD, but he refused angio at that time. I saw him in June of 2013 concerning a wound on his right great toe which ultimately healed. He had a toe pressure of 36 mm Hg and mild renal insufficiency at that time.  (He was also found to have a right carotid bruit at that time and was found to have a critical right carotid stenosis. He underwent R CEA on 12/18/2011).   Dr Fields performed a CO2 arteriogram on 11/21/11 which showed some distal aortic and iliac artery occlusive disease bilat, but images were not ideal because study was done with CO2 to prevent risk of worsening renal insufficiency. The right SFA was occluded with reconstitution of the above knee popliteal artery and 2 vessel runoff via the right posterior tibial and peroneal arteries. We were going to consider a right fempop bypass he developed any recurrent ulcerations on the right foot. Vein map then showed a marginal right GSV. I last  saw him on 01/07/12.   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 He has had significant progression of his iliac disease and has no inflow on the right. His renal insufficiency has also progressed. He continues to smoke tobacco. He denies any history of MI, CHF, or cardiac disease or symptoms.  MRI shows aortoiliac occlusive disease and a right SFA occlusion.   ASSESSMENT/PLAN: This is a complicated vascular problem. 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 Without revascularization, toe amps will not heal and wound will not heal. He would likely require a right AKA. The situation is complicated by his renal insufficiency, which has limited what we can do for a vascular workup.   The only chance for limb salvage is attempted revascularization and amputation of the 4th and 5th toes. One option would be arteriography and attempt to address the iliac disease with angioplasty and do a right fem pop. However, he appears to have distal aortic disease on MRI and Dr. Webb agrees that we should avoid any contrast. I do not think we could get adequate visualization for PTA/ stenting with CO2 alone. All things considered, I think that the best option is right   Axillo-femoral bypass and right fempop bypass in addition to amputation of the 2 toes. He does have a left femoral pulse I do not think that this would provide adequate inflow for a fem-fem bypass. Also, I would not favor ax bifem bypass as the pulse on the left is reasonable and competitive flow would increase the risk of thrombosis of a fem fem graft. Vein map previously showed marginal vein, but will explore intra op and hope to do fem pop with vein. Will review MRI with radiology to help determine best distal target (ie AK or BK pop).  I have reviewed the indications for bypass. I have also reviewed the potential complications of surgery including but not limited to:  wound healing problems, infection, graft thrombosis, limb loss, or other unpredictable medical problems. All the patient's questions were answered and he is  agreeable to proceed. I am trying to get this scheduled for tomorrow, if possible.   Chris Dickson Beeper: [phone] 06/17/2012    

## 2012-06-18 NOTE — Progress Notes (Signed)
Family Medicine Teaching Service Daily Progress Note Service Page: [phone]  Pt assessment:  57 y.o. 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   Subjective:  No complaints this am, ready to go to the OR for his surgery. Understands what procedure he is getting and why. No chest pain. NPO overnight   Objective: Temp:  [98.3 F (36.8 C)-98.9 F (37.2 C)] 98.3 F (36.8 C) (01/10 0828) Pulse Rate:  [88-100] 100  (01/10 0828) Resp:  [16-18] 18  (01/10 0828) BP: (151-172)/(64-84) 172/84 mmHg (01/10 0828) SpO2:  [97 %-100 %] 97 % (01/10 0828)   Intake/Output Summary (Last 24 hours) at 06/18/12 0953 Last data filed at 06/18/12 0741  Gross per 24 hour  Intake      0 ml  Output   3200 ml  Net  -3200 ml   Physical Exam: General: resting comfortably in bed. NAD. Cardiovascular: RRR. No murmurs, rubs, or gallops. Respiratory: CTAB. No rales, rhonchi, or wheezing. Abdomen: soft, nontender, nondistended. No organomegaly appreciated. Extremities: Necrotic 4th and 5th digit, right foot. DP and PT pulses not palpable. Neuro: No focal deficits.  CBC BMET   Lab 06/18/12 0630 06/17/12 0625 06/16/12 0514  WBC 9.1 8.9 9.8  HGB 9.9* 10.6* 12.0*  HCT 30.9* 33.8* 36.7*  PLT 199 209 224    Lab 06/18/12 0630 06/17/12 2023 06/17/12 0625  NA 146* 144 143  K 5.2* 5.2* 5.8*  CL 114* 111 110  CO2 21 21 19   BUN 65* 68* 73*  CREATININE 3.65* 4.01* 4.29*  GLUCOSE 152* 99 77  CALCIUM 8.5 8.8 8.4     Imaging/Diagnostic Tests:  Lower extremity Arterial Duplex Summary: Probableaorto-iliac disease, with severe iliac disease on right and moderately severe on left. The right common femoral artery is patent with monophasic flow. The left common femoral artery appears to be stenotic with elevated velocities. The right femoral artery appears to be occluded in its mid segment with multi-collateral flow supplying  the distal femoral artery and popliteal artery. The left femoral artery appears to be occluded just past its origin with multi-collateral flow supplying the distal femoral artery and popliteal artery. There is three vessel runoff bilaterally. There is possible moderate disease of the right anterior tibial artery and proximal left peroneal artery. The right external iliac artery demonstrates severely dampened monophasic flow. The left external iliac artery exhibits monophasic flow. Unable to visualize bilateral common iliac arteries and aorta.  ABI: Unable to insonate bilateral dorsalis pedis arteries, as well as the right posterior tibial artery. The left ABI=0.34, which is indicative of severe arterial insufficiency. Unable to calculate right ABI.  US Renal 06/16/2012 IMPRESSION:  1. Negative for hydronephrosis or acute abnormality.  2. Increased cortical echogenicity compatible with medical renal  disease.  3. Bilateral renal cysts as described  Dg Toe 4th Right 06/15/2012 IMPRESSION:  Diffuse soft tissue swelling. No plain film findings of  Osteomyelitis.  MR/MAR Abdomen, pelvis, BL LE without 06/17/2012 IMPRESSION:  1. High-grade stenosis or a short segment occlusion at the aortic  bifurcation extending a short segment into bilateral proximal  common iliac arteries.  2. Bilateral SFA occlusions with popliteal reconstitution.  3. Contiguous left anterior tibial runoff to the foot.  4. Diseased right posterior tibial and peroneal runoff across the  right ankle.  Assessment/Plan: John [name] is a 56 y.o. year old male presenting with ischemic necrosis of his 4th and  5th digits of the right foot and acute on chronic kidney injury.   Ischemic Necrosis of Toes  - No evidence of infection on exam, patient with known PVD and already established with Dr. Edilia Bo - afebrile, no tachypnea, no tachycardia - Lower Extremity Arterial Duplex shows mod-severe multi-level disease  -  Vascular surgery on board- appreciate reccs  - Will need R ax-fem bypass, R Fem pop, R 2 digit amputation, L Fem-fem today  - Consider placing A/V fistula per renal reccs?  Acute on chronic kidney disease - improving - Patient has CKD stage 4 (baseline Creatinine 3.8) and had acute rise in creatinine to 5.18 on admission. - Renal following - appreciate reccs  -  Patient will need placement of AVF per renal reccs, will discuss with vascular post-op as to when they would prefer to place it  - Creatinine improving with IV fluids - 3.65 today, K 5.2  - will continue to monitor closely   Hyperkalemia  - 5.2 this am - Received kayexalate on 1/9 and 1/8 - Will continue to follow closely and use kayexalate as needed.   Prostatic Hyperplasia - Flomax daily, foley in place - liklely need urology f/u OP  DM  - SSI and Lantus 20 Units - Good control at this time -  CBG's 70- 97  HTN  - Elevated 156-166/73-76 - Cont amlodipine 10 mg, metoprolol succinate 25 mg - consider increasing metoprolol with P 91-98, but will wait until post op to do this- re-eval in the am.   FENGI: regular diet, NS @ 100 PPX: Heparin sub Q  DISPO: Patient to undergo revascularization and amputation as well as placement of AVF. CODE STATUS: full code  Kevin Fenton, MD 06/18/2012, 9:53 AM

## 2012-06-18 NOTE — Anesthesia Postprocedure Evaluation (Signed)
  Anesthesia Post-op Note  Patient: John [name]  Procedure(s) Performed: Procedure(s) (LRB) with comments: BYPASS GRAFT AXILLA-BIFEMORAL (Right) - Right Axillo to Right Femoral Bypass Graft BYPASS GRAFT FEMORAL-POPLITEAL ARTERY (Right) - Right Femoral to Above Knee Popliteal Bypass Graft AMPUTATION DIGIT (Right) - Right side, amputation of fourth and fifth toes  Patient Location: PACU  Anesthesia Type:General  Level of Consciousness: awake, alert , oriented and patient cooperative  Airway and Oxygen Therapy: Patient Spontanous Breathing  Post-op Pain: mild  Post-op Assessment: Post-op Vital signs reviewed, Patient's Cardiovascular Status Stable, Respiratory Function Stable, Patent Airway, No signs of Nausea or vomiting and Pain level controlled  Post-op Vital Signs: Reviewed and stable  Complications: No apparent anesthesia complications

## 2012-06-18 NOTE — Telephone Encounter (Addendum)
Message copied by Shari Prows on Fri Jun 18, 2012  2:03 PM ------      Message from: [place], [place] K      Created: Fri Jun 18, 2012  1:17 PM      Regarding: Schedule                   ----- Message -----         From: Dara Lords, PA         Sent: 06/18/2012  12:46 PM           To: Sharee Pimple, CMA            S/p right ax-fem and fem-pop 06/18/12 by CSD.  He should f/u in 2 weeks with him.            Thanks,      Lelon Mast  I scheduled an appointment w/ CSD on 06/30/12 at 2:30pm. I relayed the appt info to pt's wife and also mailed appt letter/awt

## 2012-06-18 NOTE — Anesthesia Preprocedure Evaluation (Addendum)
Anesthesia Evaluation  Patient identified by MRN, date of birth, ID band Patient awake    Reviewed: Allergy & Precautions, H&P , NPO status , Patient's Chart, lab work & pertinent test results  History of Anesthesia Complications Negative for: history of anesthetic complications  Airway Mallampati: II TM Distance: >3 FB Neck ROM: Full    Dental  (+) Edentulous Upper, Poor Dentition and Dental Advisory Given   Pulmonary asthma , COPD COPD inhaler, Current Smoker,  breath sounds clear to auscultation  Pulmonary exam normal       Cardiovascular hypertension, Pt. on medications + Peripheral Vascular Disease Rhythm:Regular Rate:Normal  '07 ECHO: low normal LVF, mildly calcified aortic valve   Neuro/Psych CVA (s/p CEA)    GI/Hepatic negative GI ROS, Neg liver ROS,   Endo/Other  diabetes (glu 129), Well Controlled, Type 2, Insulin Dependent  Renal/GU Renal InsufficiencyRenal disease (K+ 5.2, creat 3.65)     Musculoskeletal   Abdominal   Peds  Hematology   Anesthesia Other Findings   Reproductive/Obstetrics                          Anesthesia Physical Anesthesia Plan  ASA: III  Anesthesia Plan: General   Post-op Pain Management:    Induction: Intravenous  Airway Management Planned: Oral ETT  Additional Equipment:   Intra-op Plan:   Post-operative Plan: Extubation in OR  Informed Consent: I have reviewed the patients History and Physical, chart, labs and discussed the procedure including the risks, benefits and alternatives for the proposed anesthesia with the patient or authorized representative who has indicated his/her understanding and acceptance.   Dental advisory given  Plan Discussed with: CRNA and Surgeon  Anesthesia Plan Comments: (Plan routine monitors, GETA)        Anesthesia Quick Evaluation

## 2012-06-18 NOTE — Progress Notes (Signed)
FMTS Attending Patient case discussed with resident team and I agree with Dr Felipa Emory note for today. Paula Compton, MD

## 2012-06-18 NOTE — Op Note (Signed)
NAME: John [name]   MRN: [id number] DOB: January 18, 1956    DATE OF OPERATION: 06/18/2012  PREOP DIAGNOSIS: gangrene of the right foot with multilevel arterial occlusive disease  POSTOP DIAGNOSIS: same  PROCEDURE:  1. Right axillofemoral bypass graft with 8 mm entering PTFE graft 2. Right femoral to above-knee popliteal artery bypass graft with 6 mm PTFE graft 3. Ray amputation of right fourth and fifth toe  SURGEON: Di Kindle. Edilia Bo, MD, FACS  ASSIST: Betti Cruz M.D., Doreatha Massed, [place]  ANESTHESIA: general   EBL: minimal  INDICATIONS: John [name] is a 57 y.o. male who presented with dry gangrene of the right fourth and fifth toes. He has evidence of multilevel arterial occlusive disease. It was felt that his best chance for limb salvage was combined right axillofemoral bypass grafting in right femoral to popliteal artery bypass grafting.  FINDINGS: he had a brisk peroneal and posterior tibial artery signal at the completion of the procedure. Intraoperative arteriogram was not done given his renal insufficiency and he is not yet on dialysis but nearing dialysis.  TECHNIQUE: The patient was brought to the operating room and received a general anesthetic. The right infraclavicular area right chest right abdomen groin and right lower extremity and foot were prepped and draped in the usual sterile fashion. An oblique incision was made in the right groin. Through this incision the common femoral artery was dissected free. This was controlled proximally and distally. Using a 2 team approach at the same time the axillary artery was exposed through an infraclavicular incision. The pectoralis minor muscle was divided. A separate longitudinal incision was made in the distal medial thigh for exposure of the above knee popliteal artery. This vein mapping showed that the saphenous vein was not adequate for use as a bypass conduit. Likewise I interrogated with the Doppler preoperatively and the vein did  not appear to be usable. This reason I elected to use a 6 mm PTFE graft. The above-knee popliteal artery was exposed to this incision and then a tunnel created from this incision to the groin incision. 6 mm PTFE graft was tunneled between the 2 incisions. Next the long tunneler was used to tunnel from the groin to the axillary artery and the 8 mm PTFE graft tunneled. The patient was then heparinized.  Using a 2 team approach the axillary anastomosis and femoral anastomosis were performed simultaneously. Axillary artery was clamped proximally and distally and a longitudinal arteriotomy was made. The vein was slightly spatulated and sewn end to side to the artery using continuous 6-0 Prolene suture. Prior to completing this anastomosis the artery was backbled and flushed and the anastomosis completed. The graft was clamped. Distally a longitudinal arteriotomy was made in the common femoral artery. The graft was cut to the appropriate length spatulated and sewn end-to-side to the common femoral artery using a continuous 6-0 Prolene suture. The graft was flushed and backbled and the anastomosis completed. Next the femoropopliteal graft was spatulated and sewn to the hood of the asked them graft. Vessels were controlled again and a longitudinal graftotomy made over the distal aspect of the right axillofemoral bypass graft. The 6 mm graft was spatulated and sewn end-to-side to the axillary graft using continuous 6-0 Prolene suture. Graft and port appropriately for anastomosis to the above-knee popliteal artery. Tourniquet was placed on the thigh. The leg was exsanguinated with an Esmarch bandage and the tourniquet inflated to 300 mm mercury. Under tourniquet control a longitudinal arteriotomy was made in the above-knee  popliteal artery. The 6 mm graft was cut to the appropriate length, spatulated, and sewn end-to-side to the above-knee pop 2 artery using continuous CV 6 PTFE suture. Prior to completing this anastomosis  the artery was backbled and flushed and the tourniquet released. Anastomosis was completed. It was a good posterior tibial and peroneal signal at the completion.  Hemostasis was obtained in the wounds and the infraclavicular incision was closed with 2 deep layers of 3-0 Vicryl and the skin closed with 4-0 subcuticular stitch. The groin incision was closed with a deep layer of 2-0 Vicryl. The subcutaneous layer was closed with 3-0 Vicryl and the skin closed with 4-0 subcuticular stitch. The above-the-knee incision was closed with deep layer 3-0 Vicryl and the skin closed with 4-0 Vicryl. Dermabond was applied.  Next attention was turned to and the patient of the fourth and fifth toes. A fit tennis racquet incision was made encompassing the 2 toes and the dissection carried down to the metatarsal head. The toes were excised and a culture was obtained. The bone was rongeured back on the metatarsal to healthy appearing bone. It was good bleeding. The wounds irrigated with copious amounts of saline. The wounds closed loosely on the lateral aspect with 2 interrupted nylon sutures. The remaining portion was left open as there was too much tension to close this primarily. Was packed. A sterile dressing was applied. Patient tolerated procedure well and was transferred to recovery in stable condition. All needle and sponge counts were correct.  Waverly Ferrari, MD, FACS [facility]  DATE OF DICTATION:   06/18/2012

## 2012-06-18 NOTE — Progress Notes (Signed)
ANTIBIOTIC CONSULT NOTE - INITIAL  Pharmacy Consult for Zosyn Indication: Gangrenous toes  Allergies  Allergen Reactions  . Gabapentin Other (See Comments)    Can't walk, shuts legs down  . Vicodin (Hydrocodone-Acetaminophen) Hives and Itching    Patient Measurements: Height: 5\' 11"  (180.3 cm) (per pt report) Weight: 186 lb (84.369 kg) IBW/kg (Calculated) : 75.3   Vital Signs: Temp: 97.2 F (36.2 C) (01/10 1500) Temp src: Oral (01/10 0638) BP: 142/71 mmHg (01/10 1412) Pulse Rate: 96  (01/10 1421) Intake/Output from previous day: 01/09 0701 - 01/10 0700 In: 120 [P.O.:120] Out: 3050 [Urine:3050] Intake/Output from this shift: Total I/O In: 1750 [I.V.:1500; IV Piggyback:250] Out: 975 [Urine:775; Blood:200]  Labs:  Basename 06/18/12 0630 06/17/12 2023 06/17/12 0625 06/16/12 0514  WBC 9.1 -- 8.9 9.8  HGB 9.9* -- 10.6* 12.0*  PLT 199 -- 209 224  LABCREA -- -- -- --  CREATININE 3.65* 4.01* 4.29* --   Estimated Creatinine Clearance: 23.8 ml/min (by C-G formula based on Cr of 3.65). No results found for this basename: VANCOTROUGH:2,VANCOPEAK:2,VANCORANDOM:2,GENTTROUGH:2,GENTPEAK:2,GENTRANDOM:2,TOBRATROUGH:2,TOBRAPEAK:2,TOBRARND:2,AMIKACINPEAK:2,AMIKACINTROU:2,AMIKACIN:2, in the last 72 hours   Microbiology: Recent Results (from the past 720 hour(s))  SURGICAL PCR SCREEN     Status: Normal   Collection Time   06/18/12  1:30 AM      Component Value Range Status Comment   MRSA, PCR NEGATIVE  NEGATIVE Final    Staphylococcus aureus NEGATIVE  NEGATIVE Final     Medical History: Past Medical History  Diagnosis Date  . COPD (chronic obstructive pulmonary disease)   . Chronic back pain     Chronic narcotics for this for many years  . Diabetes mellitus   . Hyperlipidemia   . Hypertension   . Foot pain   . Peripheral vascular disease   . Asthma   . Stroke     No residual "Mini strokes"  . Ulcer 1985  . Fracture of foot     [place]ond- Right  . Chronic kidney disease       CKD stage III  . Neuromuscular disorder     periferal neuropathy    Medications:  Anti-infectives     Start     Dose/Rate Route Frequency Ordered Stop   06/18/12 0600   cefUROXime (ZINACEF) 1.5 g in dextrose 5 % 50 mL IVPB        1.5 g 100 mL/hr over 30 Minutes Intravenous On call to O.R. 06/17/12 1542 06/18/12 1000         Assessment: 57 year old male s/p peripheral bypass grafting and right toe amputations for gangrene to start antibiotic therapy with Zosyn.  He has acute on chronic kidney disease but his clearance is currently adequate for extended-infusion Zosyn.  Goal of Therapy:  Eradication of infection  Plan:  Zosyn 3.375gm IV q8h extended infusion Monitor renal function and adjust accordingly.  Estella Husk, Pharm.D., BCPS Clinical Pharmacist  Phone [phone] Pager [phone] 06/18/2012, 3:37 PM

## 2012-06-18 NOTE — Progress Notes (Signed)
Pt admitted to 3300 from PACU. Oriented to unit and room. Safety discussed and call bell with in reach. VSS.  PT and anterior PT pulse is dopplerable.Palpable right radial pulse. All incision are c,d,and intact. Will continue to monitor.  Elijah Birk, RN

## 2012-06-18 NOTE — Preoperative (Signed)
Beta Blockers   Reason not to administer Beta Blockers:Not Applicable 

## 2012-06-18 NOTE — Transfer of Care (Signed)
Immediate Anesthesia Transfer of Care Note  Patient: John [name]  Procedure(s) Performed: Procedure(s) (LRB) with comments: BYPASS GRAFT AXILLA-BIFEMORAL (Right) - Right Axillo to Right Femoral Bypass Graft BYPASS GRAFT FEMORAL-POPLITEAL ARTERY (Right) - Right Femoral to Above Knee Popliteal Bypass Graft AMPUTATION DIGIT (Right) - Right side, amputation of fourth and fifth toes  Patient Location: PACU  Anesthesia Type:General  Level of Consciousness: awake, alert , oriented and patient cooperative  Airway & Oxygen Therapy: Patient Spontanous Breathing and Patient connected to face mask oxygen  Post-op Assessment: Report given to PACU RN, Post -op Vital signs reviewed and stable and Patient moving all extremities  Post vital signs: Reviewed and stable  Complications: No apparent anesthesia complications

## 2012-06-18 NOTE — Interval H&P Note (Signed)
History and Physical Interval Note:  06/18/2012 7:54 AM  John [name]  has presented today for surgery, with the diagnosis of PVD WITH GANGRENE TOE  The various methods of treatment have been discussed with the patient and family. After consideration of risks, benefits and other options for treatment, the patient has consented to Right axillofemoral bypass, right femoral popliteal artery byass, and amputation of right 4th and 5th toes. The patient's history has been reviewed, patient examined, no change in status, stable for surgery.  I have reviewed the patient's chart and labs.  Questions were answered to the patient's satisfaction.     [name] S

## 2012-06-19 DIAGNOSIS — Z48812 Encounter for surgical aftercare following surgery on the circulatory system: Secondary | ICD-10-CM

## 2012-06-19 LAB — GLUCOSE, CAPILLARY: Glucose-Capillary: 106 mg/dL — ABNORMAL HIGH (ref 70–99)

## 2012-06-19 LAB — CBC
HCT: 29.2 % — ABNORMAL LOW (ref 39.0–52.0)
Hemoglobin: 9.3 g/dL — ABNORMAL LOW (ref 13.0–17.0)
MCH: 28.5 pg (ref 26.0–34.0)
MCHC: 31.8 g/dL (ref 30.0–36.0)
MCV: 89.6 fL (ref 78.0–100.0)
RDW: 14.5 % (ref 11.5–15.5)

## 2012-06-19 LAB — BASIC METABOLIC PANEL
BUN: 54 mg/dL — ABNORMAL HIGH (ref 6–23)
BUN: 58 mg/dL — ABNORMAL HIGH (ref 6–23)
Calcium: 8.2 mg/dL — ABNORMAL LOW (ref 8.4–10.5)
Chloride: 114 mEq/L — ABNORMAL HIGH (ref 96–112)
Creatinine, Ser: 3.56 mg/dL — ABNORMAL HIGH (ref 0.50–1.35)
Creatinine, Ser: 4.4 mg/dL — ABNORMAL HIGH (ref 0.50–1.35)
GFR calc Af Amer: 20 mL/min — ABNORMAL LOW (ref >90)
GFR calc Af Amer: 16 mL/min — ABNORMAL LOW (ref >90)
GFR calc non Af Amer: 14 mL/min — ABNORMAL LOW (ref >90)
Glucose, Bld: 108 mg/dL — ABNORMAL HIGH (ref 70–99)
Potassium: 6.1 mEq/L — ABNORMAL HIGH (ref 3.5–5.1)

## 2012-06-19 LAB — POTASSIUM: Potassium: 5.6 mEq/L — ABNORMAL HIGH (ref 3.5–5.1)

## 2012-06-19 MED ORDER — TRAZODONE HCL 50 MG PO TABS
50.0000 mg | ORAL_TABLET | Freq: Every evening | ORAL | Status: DC | PRN
  Administered 2012-06-19 – 2012-06-22 (×2): 50 mg via ORAL
  Filled 2012-06-19 (×2): qty 1

## 2012-06-19 MED ORDER — NICOTINE 7 MG/24HR TD PT24
7.0000 mg | MEDICATED_PATCH | Freq: Every day | TRANSDERMAL | Status: DC
  Administered 2012-06-19 – 2012-06-24 (×7): 7 mg via TRANSDERMAL
  Filled 2012-06-19 (×6): qty 1

## 2012-06-19 MED ORDER — DIPHENHYDRAMINE HCL 12.5 MG/5ML PO ELIX
12.5000 mg | ORAL_SOLUTION | Freq: Four times a day (QID) | ORAL | Status: DC | PRN
  Filled 2012-06-19: qty 5

## 2012-06-19 MED ORDER — SODIUM POLYSTYRENE SULFONATE 15 GM/60ML PO SUSP
60.0000 g | Freq: Once | ORAL | Status: DC
  Filled 2012-06-19: qty 240

## 2012-06-19 MED ORDER — FUROSEMIDE 10 MG/ML IJ SOLN
40.0000 mg | Freq: Once | INTRAMUSCULAR | Status: AC
  Administered 2012-06-19: 40 mg via INTRAVENOUS
  Filled 2012-06-19: qty 4

## 2012-06-19 MED ORDER — HYDROMORPHONE 0.3 MG/ML IV SOLN
INTRAVENOUS | Status: DC
  Administered 2012-06-19: 13:00:00 via INTRAVENOUS
  Administered 2012-06-21: 1.5 mg via INTRAVENOUS
  Administered 2012-06-21: 08:00:00 via INTRAVENOUS
  Filled 2012-06-19 (×2): qty 25

## 2012-06-19 MED ORDER — SODIUM POLYSTYRENE SULFONATE 15 GM/60ML PO SUSP
15.0000 g | Freq: Once | ORAL | Status: AC
  Administered 2012-06-19: 15 g via ORAL
  Filled 2012-06-19: qty 60

## 2012-06-19 MED ORDER — SODIUM CHLORIDE 0.9 % IJ SOLN: 9.0000 mL | INTRAMUSCULAR | Status: DC | PRN

## 2012-06-19 MED ORDER — SODIUM POLYSTYRENE SULFONATE 15 GM/60ML PO SUSP
30.0000 g | Freq: Once | ORAL | Status: AC
  Administered 2012-06-20: 30 g via ORAL
  Filled 2012-06-19: qty 120

## 2012-06-19 MED ORDER — NALOXONE HCL 0.4 MG/ML IJ SOLN: 0.4000 mg | INTRAMUSCULAR | Status: DC | PRN

## 2012-06-19 MED ORDER — DIPHENHYDRAMINE HCL 50 MG/ML IJ SOLN: 12.5000 mg | Freq: Four times a day (QID) | INTRAMUSCULAR | Status: DC | PRN

## 2012-06-19 NOTE — Progress Notes (Signed)
Family Medicine Teaching Service Daily Progress Note Service Page: [phone]  Pt assessment:  57 y.o. 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   Subjective:  Pain reasonably well controlled with current meds, didn't sleep well. Trazadone did not help sleep. No chest pain, no dyspnea.   Objective: Temp:  [97.2 F (36.2 C)-99.6 F (37.6 C)] 99.6 F (37.6 C) (01/11 0400) Pulse Rate:  [66-112] 112  (01/11 0600) Resp:  [11-20] 15  (01/11 0600) BP: (128-172)/(59-105) 141/66 mmHg (01/11 0600) SpO2:  [89 %-100 %] 91 % (01/11 0600) Weight:  [185 lb 13.6 oz (84.3 kg)] 185 lb 13.6 oz (84.3 kg) (01/10 1520)   Intake/Output Summary (Last 24 hours) at 06/19/12 0657 Last data filed at 06/19/12 0600  Gross per 24 hour  Intake   2575 ml  Output   2450 ml  Net    125 ml   Physical Exam: General: resting comfortably in bed. NAD, Clean dry, intact incision on  R leg and R chest Cardiovascular: RRR. No murmurs, rubs, or gallops. Respiratory: CTAB. No rales, rhonchi, or wheezing. Abdomen: soft, nontender, nondistended. No organomegaly appreciated. Extremities: R foot with clean, dry intact dressing. Biphasic PT pulse with doppler on R, R extremity warm.  Neuro: No focal deficits.  CBC BMET   Lab 06/19/12 0506 06/18/12 0630 06/17/12 0625  WBC 12.4* 9.1 8.9  HGB 9.3* 9.9* 10.6*  HCT 29.2* 30.9* 33.8*  PLT 178 199 209    Lab 06/19/12 0506 06/18/12 0630 06/17/12 2023  NA 144 146* 144  K 6.1* 5.2* 5.2*  CL 114* 114* 111  CO2 19 21 21   BUN 54* 65* 68*  CREATININE 3.56* 3.65* 4.01*  GLUCOSE 108* 152* 99  CALCIUM 8.1* 8.5 8.8     Imaging/Diagnostic Tests:  Lower extremity Arterial Duplex Summary: Probableaorto-iliac disease, with severe iliac disease on right and moderately severe on left. The right common femoral  artery is patent with monophasic flow. The left common femoral artery appears to be stenotic with elevated velocities. The right femoral artery appears to be occluded in its mid segment with multi-collateral flow supplying the distal femoral artery and popliteal artery. The left femoral artery appears to be occluded just past its origin with multi-collateral flow supplying the distal femoral artery and popliteal artery. There is three vessel runoff bilaterally. There is possible moderate disease of the right anterior tibial artery and proximal left peroneal artery. The right external iliac artery demonstrates severely dampened monophasic flow. The left external iliac artery exhibits monophasic flow. Unable to visualize bilateral common iliac arteries and aorta.  ABI: Unable to insonate bilateral dorsalis pedis arteries, as well as the right posterior tibial artery. The left ABI=0.34, which is indicative of severe arterial insufficiency. Unable to calculate right ABI.  US Renal 06/16/2012 IMPRESSION:  1. Negative for hydronephrosis or acute abnormality.  2. Increased cortical echogenicity compatible with medical renal  disease.  3. Bilateral renal cysts as described  Dg Toe 4th Right 06/15/2012 IMPRESSION:  Diffuse soft tissue swelling. No plain film findings of  Osteomyelitis.  MR/MAR Abdomen, pelvis, BL LE without 06/17/2012 IMPRESSION:  1. High-grade stenosis or a short segment occlusion at the aortic  bifurcation extending a short segment into bilateral proximal  common iliac arteries.  2. Bilateral SFA occlusions with popliteal reconstitution.  3. Contiguous left  anterior tibial runoff to the foot.  4. Diseased right posterior tibial and peroneal runoff across the  right ankle.  Assessment/Plan: John [name] is a 57 y.o. 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 Now S/p bypass and  amuputation post op day 1.   Ischemic Necrosis of Toes  - Vascular surgery on board- appreciate reccs and treatment  - Consider placing A/V fistula per renal reccs? - Amputation and revascularization by VVS on 06/18/2012  R Ax-fem bypass, R Fem-pop bypass, Amputation of 4th and 5th digits.  - Dressing clean, dry intact, biphasic PT pulse with doppler.  - Pain controlled well on current regimen- 4 mg orpine q4, oxycodone 5/325 and 2.5 IR Q6 hours    Acute on chronic kidney disease - improving - Patient has CKD stage 4 (baseline Creatinine 3.8) and had acute rise in creatinine to 5.18 on admission. - Renal following - appreciate reccs  -  Patient will need placement of AVF per renal reccs, will discuss with vascular post-op as to when they would prefer to place it  - Creatinine improving with IV fluids - 3.56 today, K 6.1 today - given kayexelate - will continue to monitor closely   Hyperkalemia  - 6.1 this am, given kayexelate per e-link - Will continue to follow closely and use kayexalate as needed, repeat BMP at 1700  Prostatic Hyperplasia - Flomax daily, foley in place - liklely need urology f/u OP - avoid anticholinergics- namely beendryl  DM  - SSI and Lantus 20 Units - Good control at this time -  CBG's 112-117  HTN  - Elevated 128-172/72-84, likely partially elevated due to pain - Cont amlodipine 10 mg, metoprolol succinate 25 mg - consider increasing metoprolol with P 66-112, but will wait until post op to do this- re-eval in the am.   Smoking cessation - has cut down to 1/2 ppd and wants to quit.  - nicotine pattch - f/u OP with our smoking cessation specialist pharmacist  FENGI: regular diet, NS @ 100 PPX: Heparin sub Q  DISPO: Patient to undergo revascularization and amputation as well as placement of AVF. CODE STATUS: full code  Kevin Fenton, MD 06/19/2012, 6:57 AM

## 2012-06-19 NOTE — Evaluation (Signed)
Physical Therapy Evaluation Patient Details Name: John [name] MRN: [id number] DOB: [birth year] Today's Date: 06/19/2012 Time: 9147-8295 PT Time Calculation (min): 31 min  PT Assessment / Plan / Recommendation Clinical Impression  Patient is a 57 yo male s/p ray amputation of 4th and 5th toes RLE, and bypass grafts.  Patient required mod assist for bed mobility today.  Will benefit from acute PT to maximize independence prior to return home with family.  Has 24 hour assist.  Recommend HHPT at discharge.    PT Assessment  Patient needs continued PT services    Follow Up Recommendations  Home health PT;Supervision/Assistance - 24 hour    Does the patient have the potential to tolerate intense rehabilitation      Barriers to Discharge None      Equipment Recommendations  None recommended by PT    Recommendations for Other Services     Frequency Min 4X/week    Precautions / Restrictions Precautions Precautions: Fall Required Braces or Orthoses: Other Brace/Splint Other Brace/Splint: [name] RLE Restrictions Weight Bearing Restrictions: Yes RLE Weight Bearing: Partial weight bearing RLE Partial Weight Bearing Percentage or Pounds: Not indicated in order   Pertinent Vitals/Pain       Mobility  Bed Mobility Bed Mobility: Supine to Sit;Sitting - Scoot to Edge of Bed;Sit to Supine Supine to Sit: 3: Mod assist;With rails;HOB elevated Sitting - Scoot to Edge of Bed: 4: Min assist;With rail Sit to Supine: 3: Mod assist;With rail;HOB elevated Details for Bed Mobility Assistance: Verbal cues for technique. Assist to raise trunk to sitting position. Patient sat EOB x 8 minutes, working on upright posture and balance.  Patient leaning to right, and required UE support to maintain balance.  Assist to raise LE's to bed to return to supine. Transfers Transfers: Not assessed           PT Diagnosis: Difficulty walking;Abnormality of gait;Generalized weakness;Acute pain  PT  Problem List: Decreased strength;Decreased activity tolerance;Decreased balance;Decreased mobility;Decreased knowledge of use of DME;Decreased knowledge of precautions;Pain PT Treatment Interventions: DME instruction;Gait training;Functional mobility training;Therapeutic exercise;Patient/family education   PT Goals Acute Rehab PT Goals PT Goal Formulation: With patient Time For Goal Achievement: 06/26/12 Potential to Achieve Goals: Good Pt will go Supine/Side to Sit: Independently;with HOB 0 degrees PT Goal: Supine/Side to Sit - Progress: Goal set today Pt will go Sit to Supine/Side: Independently;with HOB 0 degrees PT Goal: Sit to Supine/Side - Progress: Goal set today Pt will go Sit to Stand: with supervision;with upper extremity assist PT Goal: Sit to Stand - Progress: Goal set today Pt will Transfer Bed to Chair/Chair to Bed: with supervision PT Transfer Goal: Bed to Chair/Chair to Bed - Progress: Goal set today Pt will Ambulate: 51 - 150 feet;with supervision;with rolling walker PT Goal: Ambulate - Progress: Goal set today  Visit Information  Last PT Received On: 06/19/12 Assistance Needed: +2    Subjective Data  Subjective: "It (leg) doesn't hurt as much now"  On PCA. Patient Stated Goal: To go home   Prior Functioning  Home Living Lives With: Spouse Available Help at Discharge: Family;Available 24 hours/day Type of Home: House Home Access: Level entry Home Layout: One level Bathroom Shower/Tub: Engineer, manufacturing systems: Standard Home Adaptive Equipment: Shower chair with back;Walker - rolling;Straight cane Prior Function Level of Independence: Independent with assistive device(s) Able to Take Stairs?: Yes Driving: Yes Vocation: On disability Communication Communication: No difficulties    Cognition  Overall Cognitive Status: Appears within functional limits for tasks assessed/performed  Arousal/Alertness: Lethargic Orientation Level: Oriented X4 /  Intact Behavior During Session: WFL for tasks performed    Extremity/Trunk Assessment Right Upper Extremity Assessment RUE ROM/Strength/Tone: WFL for tasks assessed RUE Sensation: WFL - Light Touch Left Upper Extremity Assessment LUE ROM/Strength/Tone: WFL for tasks assessed LUE Sensation: WFL - Light Touch Right Lower Extremity Assessment RLE ROM/Strength/Tone: Deficits RLE ROM/Strength/Tone Deficits: Strength 3+/5 RLE Sensation: Deficits RLE Sensation Deficits: Decreased to light touch Left Lower Extremity Assessment LLE ROM/Strength/Tone: Deficits LLE ROM/Strength/Tone Deficits: Strength 4-/5 LLE Sensation: Deficits LLE Sensation Deficits: Decreased to light touch   Balance Balance Balance Assessed: Yes Static Sitting Balance Static Sitting - Balance Support: Right upper extremity supported;Left upper extremity supported;Feet supported Static Sitting - Level of Assistance: 4: Min assist Static Sitting - Comment/# of Minutes: Patient sat EOB x 8 minutes.  Assist required to maintain balance and upright posture.  Patient leaning to right and posteriorly.  End of Session PT - End of Session Activity Tolerance: Patient limited by fatigue Patient left: in bed;with call bell/phone within reach Nurse Communication: Mobility status  GP     Vena Austria 06/19/2012, 5:27 PM Durenda Hurt. Renaldo Fiddler, [facility] Pager [phone]

## 2012-06-19 NOTE — Progress Notes (Signed)
FMTS Attending Note Patient seen and examined by me, doing well POD#1 s/p revascularization and R 4/5th toe amputation.   Plan to reassess his potassium after treatment with Kayexalate.  To follow along with vascular surgery plans for possible L-sided revascularization and/or HD access. Paula Compton, MD

## 2012-06-19 NOTE — Progress Notes (Signed)
Hyperkalemia K 6.1   EKG stat, lasix and kayexalate ordered

## 2012-06-19 NOTE — Progress Notes (Addendum)
[facility] Progress Note  06/19/2012 7:39 AM POD 1  Subjective:  Rough night with pain.  States it improved with pain medication  Tm 99.6 HR 60-s-110's regular 120's-170's systolic 91%RA  ABx:  Zosyn  Filed Vitals:   06/19/12 0600  BP: 141/66  Pulse: 112  Temp:   Resp: 15    Physical Exam: Incisions:  All incisions are c/d/i Extremities:  Right foot wrapped with ace bandage and is clean and dry.  There is a good peroneal doppler signal on the right.  Bilateral feet are warm and well perfused.  CBC    Component Value Date/Time   WBC 12.4* 06/19/2012 0506   RBC 3.26* 06/19/2012 0506   HGB 9.3* 06/19/2012 0506   HCT 29.2* 06/19/2012 0506   PLT 178 06/19/2012 0506   MCV 89.6 06/19/2012 0506   MCH 28.5 06/19/2012 0506   MCHC 31.8 06/19/2012 0506   RDW 14.5 06/19/2012 0506   LYMPHSABS 2.4 06/15/2012 2356   MONOABS 1.0 06/15/2012 2356   EOSABS 0.2 06/15/2012 2356   BASOSABS 0.0 06/15/2012 2356    BMET    Component Value Date/Time   NA 144 06/19/2012 0506   K 6.1* 06/19/2012 0506   CL 114* 06/19/2012 0506   CO2 19 06/19/2012 0506   GLUCOSE 108* 06/19/2012 0506   BUN 54* 06/19/2012 0506   CREATININE 3.56* 06/19/2012 0506   CREATININE 3.17* 04/21/2012 1037   CALCIUM 8.1* 06/19/2012 0506   CALCIUM 9.0 01/21/2008 0000   GFRNONAA 18* 06/19/2012 0506   GFRAA 20* 06/19/2012 0506    INR    Component Value Date/Time   INR 0.98 12/10/2011 0933     Intake/Output Summary (Last 24 hours) at 06/19/12 0739 Last data filed at 06/19/12 0600  Gross per 24 hour  Intake   2575 ml  Output   2450 ml  Net    125 ml     Assessment/Plan:  57 y.o. male is s/p  1. Right axillofemoral bypass graft with 8 mm entering PTFE graft  2. Right femoral to above-knee popliteal artery bypass graft with 6 mm PTFE graft  3. Ray amputation of right fourth and fifth toe   POD 1  -hyperkalemia this am of 6.1-ELINK ordered lasix and kayexalate; pt has CKD -acute surgical blood loss  anemia-tolerating -mobilize with PT today.  Pt awaiting darco shoe.  There are instructions in the orders about PT -WBC slightly elevated this am-probably post surgical-pt is on zosyn. -ck labs in am  Doreatha Massed, PA-C [facility] [phone] 06/19/2012 7:39 AM   Agree with above Right axillofemoral and femoral-popliteal graft patent Right foot dressing clean and dry-intact-will change in a.m. Moist to dry dressing changes per nursing staff to begin today Will transfer to 2000

## 2012-06-19 NOTE — Progress Notes (Addendum)
VASCULAR LAB PRELIMINARY  PRELIMINARY  PRELIMINARY  PRELIMINARY  Left upper extremity vein mapping completed.    Left Upper Extremity Vein Map    Cephalic  Segment Diameter Depth Comment  1. Axilla 4mm mm   2. Mid upper arm 4.22mm mm   3. Above AC mm mm   4. In AC 6mm mm branch  5. Below AC 2.35mm mm branch  6. Mid forearm 2mm mm   7. Wrist 1.22mm mm    mm mm    mm mm    mm mm    Basilic  Segment Diameter Depth Comment  1. Axilla 7.47mm 8.73mm   2. Mid upper arm 6.68mm 14mm   3. Above AC mm mm   4. In [facility] 7.65mm 8.71mm   5. Below AC 4.59mm 4.63mm branch  6. Mid forearm 4.46mm 3.35mm branch  7. Wrist 3.27mm 3mm branch   mm mm    mm mm    mm mm     Norinne Jeane, RVT 06/19/2012, 10:03 AM

## 2012-06-19 NOTE — Progress Notes (Signed)
[facility] ROUNDING NOTE   Subjective:   Interval History: BYPASS GRAFT AXILLA-BIFEMORAL (Right) - Right Axillo to Right Femoral Bypass Graft  BYPASS GRAFT FEMORAL-POPLITEAL ARTERY (Right) - Right Femoral to Above Knee Popliteal Bypass Graft  AMPUTATION DIGIT (Right) - Right side, amputation of fourth and fifth toes  post op day 1   Objective:  Vital signs in last 24 hours:  Temp:  [97.2 F (36.2 C)-99.6 F (37.6 C)] 98.1 F (36.7 C) (01/11 1151) Pulse Rate:  [66-115] 115  (01/11 0755) Resp:  [11-20] 19  (01/11 0755) BP: (128-169)/(59-105) 151/64 mmHg (01/11 0755) SpO2:  [89 %-100 %] 92 % (01/11 0755) Weight:  [84.3 kg (185 lb 13.6 oz)] 84.3 kg (185 lb 13.6 oz) (01/10 1520)  Weight change:  Filed Weights   06/16/12 2030 06/18/12 1520  Weight: 84.369 kg (186 lb) 84.3 kg (185 lb 13.6 oz)    Intake/Output: I/O last 3 completed shifts: In: 2637.5 [I.V.:2250; IV Piggyback:387.5] Out: 4200 [Urine:4000; Blood:200]   Intake/Output this shift:  Total I/O In: 62.5 [I.V.:50; IV Piggyback:12.5] Out: 600 [Urine:600]  CVS- RRR RS- CTA ABD- BS present soft non-distended EXT- no edema   Basic Metabolic Panel:  Lab 06/19/12 1610 06/19/12 0506 06/18/12 0630 06/17/12 2023 06/17/12 0625 06/16/12 0858  NA -- 144 146* 144 143 139  K 5.9* 6.1* 5.2* 5.2* 5.8* --  CL -- 114* 114* 111 110 105  CO2 -- 19 21 21 19 21   GLUCOSE -- 108* 152* 99 77 174*  BUN -- 54* 65* 68* 73* 82*  CREATININE -- 3.56* 3.65* 4.01* 4.29* 4.89*  CALCIUM -- 8.1* 8.5 8.8 -- --  MG -- -- -- -- -- --  PHOS -- -- -- -- -- 5.1*    Liver Function Tests:  Lab 06/16/12 0858  AST --  ALT --  ALKPHOS --  BILITOT --  PROT --  ALBUMIN 3.3*   No results found for this basename: LIPASE:5,AMYLASE:5 in the last 168 hours No results found for this basename: AMMONIA:3 in the last 168 hours  CBC:  Lab 06/19/12 0506 06/18/12 0630 06/17/12 0625 06/16/12 0514 06/15/12 2356  WBC 12.4* 9.1 8.9 9.8  11.0*  NEUTROABS -- -- -- -- 7.4  HGB 9.3* 9.9* 10.6* 12.0* 11.3*  HCT 29.2* 30.9* 33.8* 36.7* 35.3*  MCV 89.6 88.8 88.9 87.6 87.6  PLT 178 199 209 224 216    Cardiac Enzymes: No results found for this basename: CKTOTAL:5,CKMB:5,CKMBINDEX:5,TROPONINI:5 in the last 168 hours  BNP: No components found with this basename: POCBNP:5  CBG:  Lab 06/19/12 1148 06/19/12 0842 06/18/12 2156 06/18/12 1756 06/18/12 1332  GLUCAP 118* 106* 112* 118* 117*    Microbiology: Results for orders placed during the hospital encounter of 06/15/12  SURGICAL PCR SCREEN     Status: Normal   Collection Time   06/18/12  1:30 AM      Component Value Range Status Comment   MRSA, PCR NEGATIVE  NEGATIVE Final    Staphylococcus aureus NEGATIVE  NEGATIVE Final   WOUND CULTURE     Status: Normal (Preliminary result)   Collection Time   06/18/12 12:45 PM      Component Value Range Status Comment   Specimen Description WOUND FOOT RIGHT   Final    Special Requests     Final    Value: SWAB FROM AMPUTATION SITE RT FOURTH AND FIFTH TOES PT ON ANCEF   Gram Stain     Final    Value: NO WBC  SEEN     RARE SQUAMOUS EPITHELIAL CELLS PRESENT     FEW GRAM NEGATIVE RODS   Culture Culture reincubated for better growth   Final    Report Status PENDING   Incomplete     Coagulation Studies: No results found for this basename: LABPROT:5,INR:5 in the last 72 hours  Urinalysis: No results found for this basename: COLORURINE:2,APPERANCEUR:2,LABSPEC:2,PHURINE:2,GLUCOSEU:2,HGBUR:2,BILIRUBINUR:2,KETONESUR:2,PROTEINUR:2,UROBILINOGEN:2,NITRITE:2,LEUKOCYTESUR:2 in the last 72 hours    Imaging: No results found.   Medications:      . sodium chloride 75 mL/hr at 06/19/12 1146      . amLODipine  10 mg Oral Daily  . aspirin  81 mg Oral Daily  . baclofen  10 mg Oral BID  . docusate sodium  100 mg Oral BID  . heparin  5,000 Units Subcutaneous Q8H  . HYDROmorphone PCA 0.3 mg/mL   Intravenous Q4H  . insulin aspart  0-15  Units Subcutaneous TID WC  . insulin aspart  0-5 Units Subcutaneous QHS  . insulin glargine  20 Units Subcutaneous Daily  . metoprolol succinate  25 mg Oral Daily  . nortriptyline  10 mg Oral QHS  . pantoprazole  40 mg Oral Daily  . piperacillin-tazobactam (ZOSYN)  IV  3.375 g Intravenous Q8H  . simvastatin  20 mg Oral q1800  . sodium polystyrene  60 g Oral Once  . Tamsulosin HCl  0.4 mg Oral Daily   acetaminophen, alum & mag hydroxide-simeth, diphenhydrAMINE, diphenhydrAMINE, guaiFENesin-dextromethorphan, hydrALAZINE, labetalol, metoprolol, naloxone, ondansetron (ZOFRAN) IV, ondansetron, phenol, sodium chloride, traZODone  Assessment/ Plan:  CKD stage 4/5 BYPASS GRAFT AXILLA-BIFEMORAL (Right) - Right Axillo to Right Femoral Bypass Graft  BYPASS GRAFT FEMORAL-POPLITEAL ARTERY (Right) - Right Femoral to Above Knee Popliteal Bypass Graft  AMPUTATION DIGIT (Right) - Right side, amputation of fourth and fifth toes  post op day 1 HTN/Volume controlled  Anemia Goal  Hyperkalemia low K diet and kayexalate  Will give additional kayexalate   LOS: 4 John [name] W @TODAY @12 :05 PM

## 2012-06-19 NOTE — Progress Notes (Signed)
Micro reviewed   Wound culture pos for GNR   Currently s/p I&D; on Zosyn  No further intervention necessary; defer to primary team further antibiotic adjustments based on culture and sensitivity.

## 2012-06-20 LAB — CBC
HCT: 26.5 % — ABNORMAL LOW (ref 39.0–52.0)
MCHC: 30.6 g/dL (ref 30.0–36.0)
MCV: 90.1 fL (ref 78.0–100.0)
RDW: 14.6 % (ref 11.5–15.5)
WBC: 11.6 10*3/uL — ABNORMAL HIGH (ref 4.0–10.5)

## 2012-06-20 LAB — BASIC METABOLIC PANEL
BUN: 63 mg/dL — ABNORMAL HIGH (ref 6–23)
BUN: 62 mg/dL — ABNORMAL HIGH (ref 6–23)
CO2: 17 mEq/L — ABNORMAL LOW (ref 19–32)
Chloride: 109 mEq/L (ref 96–112)
Chloride: 109 mEq/L (ref 96–112)
Creatinine, Ser: 4.52 mg/dL — ABNORMAL HIGH (ref 0.50–1.35)
GFR calc Af Amer: 15 mL/min — ABNORMAL LOW (ref >90)
Potassium: 5 mEq/L (ref 3.5–5.1)

## 2012-06-20 LAB — GLUCOSE, CAPILLARY
Glucose-Capillary: 66 mg/dL — ABNORMAL LOW (ref 70–99)
Glucose-Capillary: 84 mg/dL (ref 70–99)
Glucose-Capillary: 132 mg/dL — ABNORMAL HIGH (ref 70–99)

## 2012-06-20 LAB — IRON AND TIBC: TIBC: 173 ug/dL — ABNORMAL LOW (ref 215–435)

## 2012-06-20 MED ORDER — SODIUM POLYSTYRENE SULFONATE 15 GM/60ML PO SUSP
30.0000 g | Freq: Once | ORAL | Status: AC
  Administered 2012-06-20: 30 g via ORAL
  Filled 2012-06-20 (×2): qty 120

## 2012-06-20 MED ORDER — INSULIN GLARGINE 100 UNIT/ML ~~LOC~~ SOLN
15.0000 [IU] | Freq: Every day | SUBCUTANEOUS | Status: DC
  Administered 2012-06-20: 15 [IU] via SUBCUTANEOUS

## 2012-06-20 MED ORDER — INSULIN GLARGINE 100 UNIT/ML ~~LOC~~ SOLN
20.0000 [IU] | Freq: Every day | SUBCUTANEOUS | Status: DC
  Administered 2012-06-21 – 2012-06-24 (×4): 20 [IU] via SUBCUTANEOUS

## 2012-06-20 MED ORDER — PIPERACILLIN-TAZOBACTAM IN DEX 2-0.25 GM/50ML IV SOLN
2.2500 g | Freq: Three times a day (TID) | INTRAVENOUS | Status: DC
  Administered 2012-06-20 – 2012-06-21 (×3): 2.25 g via INTRAVENOUS
  Filled 2012-06-20 (×4): qty 50

## 2012-06-20 MED ORDER — SODIUM BICARBONATE 650 MG PO TABS
650.0000 mg | ORAL_TABLET | Freq: Two times a day (BID) | ORAL | Status: DC
  Administered 2012-06-20 – 2012-06-24 (×7): 650 mg via ORAL
  Filled 2012-06-20 (×10): qty 1

## 2012-06-20 MED ORDER — DARBEPOETIN ALFA-POLYSORBATE 100 MCG/0.5ML IJ SOLN
100.0000 ug | INTRAMUSCULAR | Status: DC
  Filled 2012-06-20: qty 0.5

## 2012-06-20 MED ORDER — SODIUM POLYSTYRENE SULFONATE 15 GM/60ML PO SUSP: 15.0000 g | Freq: Once | ORAL | Status: DC

## 2012-06-20 MED ORDER — AMLODIPINE BESYLATE 5 MG PO TABS
5.0000 mg | ORAL_TABLET | Freq: Every day | ORAL | Status: DC
  Administered 2012-06-21 – 2012-06-24 (×4): 5 mg via ORAL
  Filled 2012-06-20 (×4): qty 1

## 2012-06-20 NOTE — Progress Notes (Signed)
[name] ROUNDING NOTE   Subjective:   Interval History:2 days post right axillofemoral and right femoral-popliteal bypass graft with amputation right fourth and fifth toes. Using bathroom for large bowel movement following kayexalate   Objective:  Vital signs in last 24 hours:  Temp:  [98.1 F (36.7 C)-99.5 F (37.5 C)] 99 F (37.2 C) (01/12 0431) Pulse Rate:  [92-109] 99  (01/12 0431) Resp:  [12-22] 22  (01/12 0800) BP: (109-149)/(61-73) 126/73 mmHg (01/12 0431) SpO2:  [93 %-100 %] 98 % (01/12 0800)  Weight change:  Filed Weights   06/16/12 2030 06/18/12 1520  Weight: 84.369 kg (186 lb) 84.3 kg (185 lb 13.6 oz)    Intake/Output: I/O last 3 completed shifts: In: 1380 [P.O.:480; I.V.:800; IV Piggyback:100] Out: 2150 [Urine:2150]   Intake/Output this shift:     CVS- RRR RS- CTA ABD- BS present soft non-distended EXT- no edema Axillofemoral and femoral-popliteal surgical sites healing nicely     Basic Metabolic Panel:  Lab 06/20/12 4098 06/20/12 0004 06/19/12 2035 06/19/12 1700 06/19/12 1239 06/19/12 0506 06/18/12 0630 06/17/12 2023 06/16/12 0858  NA 141 -- 142 -- -- 144 146* 144 --  K 5.7* 5.7* 6.0* 5.6* 5.8* -- -- -- --  CL 109 -- 109 -- -- 114* 114* 111 --  CO2 17* -- 20 -- -- 19 21 21  --  GLUCOSE 77 -- 93 -- -- 108* 152* 99 --  BUN 63* -- 58* -- -- 54* 65* 68* --  CREATININE 4.52* -- 4.40* -- -- 3.56* 3.65* 4.01* --  CALCIUM 8.4 -- 8.2* -- -- 8.1* -- -- --  MG -- -- -- -- -- -- -- -- --  PHOS -- -- -- -- -- -- -- -- 5.1*    Liver Function Tests:  Lab 06/16/12 0858  AST --  ALT --  ALKPHOS --  BILITOT --  PROT --  ALBUMIN 3.3*   No results found for this basename: LIPASE:5,AMYLASE:5 in the last 168 hours No results found for this basename: AMMONIA:3 in the last 168 hours  CBC:  Lab 06/20/12 0700 06/19/12 0506 06/18/12 0630 06/17/12 0625 06/16/12 0514 06/15/12 2356  WBC 11.6* 12.4* 9.1 8.9 9.8 --  NEUTROABS -- -- -- -- -- 7.4    HGB 8.1* 9.3* 9.9* 10.6* 12.0* --  HCT 26.5* 29.2* 30.9* 33.8* 36.7* --  MCV 90.1 89.6 88.8 88.9 87.6 --  PLT 143* 178 199 209 224 --    Cardiac Enzymes: No results found for this basename: CKTOTAL:5,CKMB:5,CKMBINDEX:5,TROPONINI:5 in the last 168 hours  BNP: No components found with this basename: POCBNP:5  CBG:  Lab 06/20/12 0640 06/20/12 0602 06/19/12 2100 06/19/12 1620 06/19/12 1148  GLUCAP 77 66* 105* 143* 118*    Microbiology: Results for orders placed during the hospital encounter of 06/15/12  SURGICAL PCR SCREEN     Status: Normal   Collection Time   06/18/12  1:30 AM      Component Value Range Status Comment   MRSA, PCR NEGATIVE  NEGATIVE Final    Staphylococcus aureus NEGATIVE  NEGATIVE Final   WOUND CULTURE     Status: Normal (Preliminary result)   Collection Time   06/18/12 12:45 PM      Component Value Range Status Comment   Specimen Description WOUND FOOT RIGHT   Final    Special Requests     Final    Value: SWAB FROM AMPUTATION SITE RT FOURTH AND FIFTH TOES PT ON ANCEF   Gram Stain  Final    Value: NO WBC SEEN     RARE SQUAMOUS EPITHELIAL CELLS PRESENT     FEW GRAM NEGATIVE RODS   Culture MODERATE PSEUDOMONAS AERUGINOSA   Final    Report Status PENDING   Incomplete     Coagulation Studies: No results found for this basename: LABPROT:5,INR:5 in the last 72 hours  Urinalysis: No results found for this basename: COLORURINE:2,APPERANCEUR:2,LABSPEC:2,PHURINE:2,GLUCOSEU:2,HGBUR:2,BILIRUBINUR:2,KETONESUR:2,PROTEINUR:2,UROBILINOGEN:2,NITRITE:2,LEUKOCYTESUR:2 in the last 72 hours    Imaging: No results found.   Medications:      . sodium chloride 15 mL/hr at 06/19/12 2342      . amLODipine  10 mg Oral Daily  . aspirin  81 mg Oral Daily  . baclofen  10 mg Oral BID  . docusate sodium  100 mg Oral BID  . heparin  5,000 Units Subcutaneous Q8H  . HYDROmorphone PCA 0.3 mg/mL   Intravenous Q4H  . insulin aspart  0-15 Units Subcutaneous TID WC  .  insulin aspart  0-5 Units Subcutaneous QHS  . insulin glargine  15 Units Subcutaneous Daily  . metoprolol succinate  25 mg Oral Daily  . nicotine  7 mg Transdermal Daily  . nortriptyline  10 mg Oral QHS  . pantoprazole  40 mg Oral Daily  . piperacillin-tazobactam (ZOSYN)  IV  3.375 g Intravenous Q8H  . simvastatin  20 mg Oral q1800  . Tamsulosin HCl  0.4 mg Oral Daily   acetaminophen, alum & mag hydroxide-simeth, diphenhydrAMINE, diphenhydrAMINE, guaiFENesin-dextromethorphan, hydrALAZINE, labetalol, metoprolol, naloxone, ondansetron (ZOFRAN) IV, ondansetron, phenol, sodium chloride, traZODone  Assessment/ Plan:   Chronic renal Failure secondary to diabetic nephropathy Stage 5 Not requiring dialysis at present will need permanent access prior to discharge  Anemia check iron levels and start Aranesp  Hyperkalemia  Low K diet and kayexalate  Metabolic acidosis start sodium bicarbonate 650mg  bid  HTN controlled will decrease amlodipine to 5 mg daily  Diabetes appears well controlled   2 days post right axillofemoral and right femoral-popliteal bypass graft with amputation right fourth and fifth toes. Has not required dialysis but developing hyperkalemia metabolic acidosis    LOS: 5 [name] W @TODAY @11 :17 AM

## 2012-06-20 NOTE — Progress Notes (Signed)
FMTS Attending Note Patient seen and examined by me, discussed with resident team and I agree with Dr Felipa Emory assessment and plan for today. To continue to monitor potassium; for HD access prior to discharge. Paula Compton, MD

## 2012-06-20 NOTE — Progress Notes (Signed)
Patient ID: CHASTIN RIESGO, male   DOB: Sep 09, 1955, 57 y.o.   MRN: [id number] Vascular Surgery Progress Note  Subjective: 2 days post right axillofemoral and right femoral-popliteal bypass graft with amputation right fourth and fifth toes. Still requiring PCA pump for pain relief but improved from yesterday. Good appetite. Has not ambulated yet.  Objective:  Filed Vitals:   06/20/12 0431  BP: 126/73  Pulse: 99  Temp: 99 F (37.2 C)  Resp: 18    General alert and oriented x3 Lungs no rhonchi or wheezing-nasal oxygen in place Receiving PCA-pain medication Axillofemoral and femoral-popliteal surgical sites healing nicely with 3+ pulse axillofemoral graft. Indication site examined which is closed about 50% and packed open otherwise-begin dressing changes today   Labs:  Lab 06/19/12 2035 06/19/12 0506 06/18/12 0630  CREATININE 4.40* 3.56* 3.65*    Lab 06/20/12 0004 06/19/12 2035 06/19/12 1700 06/19/12 0506 06/18/12 0630  NA -- 142 -- 144 146*  K 5.7* 6.0* 5.6* -- --  CL -- 109 -- 114* 114*  CO2 -- 20 -- 19 21  BUN -- 58* -- 54* 65*  CREATININE -- 4.40* -- 3.56* 3.65*  LABGLOM -- -- -- -- --  GLUCOSE -- 93 -- -- --  CALCIUM -- 8.2* -- 8.1* 8.5    Lab 06/20/12 0700 06/19/12 0506 06/18/12 0630  WBC 11.6* 12.4* 9.1  HGB 8.1* 9.3* 9.9*  HCT 26.5* 29.2* 30.9*  PLT 143* 178 199   No results found for this basename: INR:3 in the last 168 hours  I/O last 3 completed shifts: In: 1380 [P.O.:480; I.V.:800; IV Piggyback:100] Out: 2150 [Urine:2150]  Imaging: No results found.  Assessment/Plan:  POD #2  LOS: 5 days  s/p Procedure(s): BYPASS GRAFT AXILLA-BIFEMORAL BYPASS GRAFT FEMORAL-POPLITEAL ARTERY AMPUTATION DIGIT  #1 generally doing well following right asked them femoropopliteal bypass graft with fourth and fifth toe amputations #2 requiring PCA pump for pain #3 Will begin dressing changes today right foot and increase out of bed   Josephina Gip, MD 06/20/2012 8:16  AM

## 2012-06-20 NOTE — Progress Notes (Signed)
Family Medicine Teaching Service Daily Progress Note Service Page: [phone]  Pt assessment:  57 y.o. 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   Subjective:  Pain better controlled with PCA, hurting more this am with lots of movement. Denies chest pain and dyspnea.   Objective: Temp:  [98.1 F (36.7 C)-99.5 F (37.5 C)] 99 F (37.2 C) (01/12 0431) Pulse Rate:  [92-109] 99  (01/12 0431) Resp:  [12-22] 22  (01/12 0800) BP: (109-149)/(61-73) 126/73 mmHg (01/12 0431) SpO2:  [93 %-100 %] 98 % (01/12 0800)   Intake/Output Summary (Last 24 hours) at 06/20/12 1021 Last data filed at 06/19/12 2300  Gross per 24 hour  Intake    390 ml  Output    825 ml  Net   -435 ml   Physical Exam: General: resting comfortably in bed. NAD, Clean dry, intact incision on  R leg and R chest Cardiovascular: RRR. No murmurs, rubs, or gallops. Respiratory: CTAB. No rales, rhonchi, or wheezing. Abdomen: soft, nontender, nondistended. No organomegaly appreciated. Extremities: R foot with clean, dry intact dressing. Biphasic PT pulse with doppler on R, R extremity warm.  Neuro: No focal deficits.  CBC BMET   Lab 06/20/12 0700 06/19/12 0506 06/18/12 0630  WBC 11.6* 12.4* 9.1  HGB 8.1* 9.3* 9.9*  HCT 26.5* 29.2* 30.9*  PLT 143* 178 199    Lab 06/20/12 0700 06/20/12 0004 06/19/12 2035 06/19/12 0506  NA 141 -- 142 144  K 5.7* 5.7* 6.0* --  CL 109 -- 109 114*  CO2 17* -- 20 19  BUN 63* -- 58* 54*  CREATININE 4.52* -- 4.40* 3.56*  GLUCOSE 77 -- 93 108*  CALCIUM 8.4 -- 8.2* 8.1*     Imaging/Diagnostic Tests:  Lower extremity Arterial Duplex Summary: Probableaorto-iliac disease, with severe iliac disease on right and moderately severe on left. The right common femoral artery is patent with monophasic flow. The left common  femoral artery appears to be stenotic with elevated velocities. The right femoral artery appears to be occluded in its mid segment with multi-collateral flow supplying the distal femoral artery and popliteal artery. The left femoral artery appears to be occluded just past its origin with multi-collateral flow supplying the distal femoral artery and popliteal artery. There is three vessel runoff bilaterally. There is possible moderate disease of the right anterior tibial artery and proximal left peroneal artery. The right external iliac artery demonstrates severely dampened monophasic flow. The left external iliac artery exhibits monophasic flow. Unable to visualize bilateral common iliac arteries and aorta.  ABI: Unable to insonate bilateral dorsalis pedis arteries, as well as the right posterior tibial artery. The left ABI=0.34, which is indicative of severe arterial insufficiency. Unable to calculate right ABI.  US Renal 06/16/2012 IMPRESSION:  1. Negative for hydronephrosis or acute abnormality.  2. Increased cortical echogenicity compatible with medical renal  disease.  3. Bilateral renal cysts as described  Dg Toe 4th Right 06/15/2012 IMPRESSION:  Diffuse soft tissue swelling. No plain film findings of  Osteomyelitis.  MR/MAR Abdomen, pelvis, BL LE without 06/17/2012 IMPRESSION:  1. High-grade stenosis or a short segment occlusion at the aortic  bifurcation extending a short segment into bilateral proximal  common iliac arteries.  2. Bilateral SFA occlusions with popliteal reconstitution.  3. Contiguous left anterior tibial runoff to the foot.  4. Diseased right posterior tibial and peroneal runoff across the  right ankle.  Assessment/Plan: John [name] is a 57 y.o. 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 Now S/p bypass and amuputation post op day 2.   Ischemic Necrosis of Toes  -  Vascular surgery on board- appreciate reccs and treatment  - Considering placing A/V fistula per renal reccs, vascular mapping complete - Amputation and revascularization by [facility] on 06/18/2012  R Ax-fem bypass, R Fem-pop bypass, Amputation of 4th and 5th digits.  - Dressing clean, dry intact, biphasic PT pulse with doppler.  - Pain controlled well on current regimen - dilaudid PCA   Acute on chronic kidney disease - improving - Patient has CKD stage 4 (baseline Creatinine 3.8) and had acute rise in creatinine to 5.18 on admission. - Renal following - appreciate reccs  -  Patient will need placement of AVF per renal reccs - Creatinine worsened today - up to 4.5 from 3.5 on 1/11, K 5.7 today  - UOP 1 L on 1/11 - will continue to monitor closely   Hyperkalemia  - 5.7 this am, given kayexelate 30g just after midnight and after last reading this am - Will continue to follow closely and use kayexalate as needed - repeat BMP at 1700  Prostatic Hyperplasia - Flomax daily, foley in place - liklely need urology f/u OP - avoid anticholinergics- namely benedryl  DM  - SSI and Lantus 20 Units decrease to 15 - Good control at this time -  CBG's 77-143 - decrease lantus to 15 units daily as to prevent hypoglycemia  HTN  - Elevated 109-151/64-70, likely partially elevated due to pain - Cont amlodipine 10 mg, metoprolol succinate 25 mg - consider increasing metoprolol with P 92-115   Smoking cessation - has cut down to 1/2 ppd and wants to quit.  - nicotine pattch - f/u OP with our smoking cessation specialist pharmacist  FENGI: regular diet, NS @ 100 PPX: Heparin sub Q  DISPO: Patient to undergo revascularization and amputation as well as placement of AVF. CODE STATUS: full code  Kevin Fenton, MD 06/20/2012, 10:21 AM

## 2012-06-20 NOTE — Progress Notes (Signed)
Pt I & O cath'd after bladder scanner showed 990cc's urine.  500cc urine obtained and cath stopped draining.  Pt had difficulty tolerating procedure, but no evidence of bleeding, etc.  Just very painful for pt.  Completed approx. 1445.

## 2012-06-20 NOTE — Progress Notes (Signed)
Physical Therapy Treatment Patient Details Name: John [name] MRN: [id number] DOB: 07/18/1955 Today's Date: 06/20/2012 Time: 1202-1233 PT Time Calculation (min): 31 min  PT Assessment / Plan / Recommendation Comments on Treatment Session  Patient requiring +2 assist to perform transfers today, limited by pain and weakness.  Progress slower than anticipated.  Feel patient will need more comprehensive therapy to maximize independence prior to return home.  Recommend Inpatient Rehab consult.    Follow Up Recommendations  CIR     Does the patient have the potential to tolerate intense rehabilitation     Barriers to Discharge        Equipment Recommendations  None recommended by PT    Recommendations for Other Services    Frequency Min 4X/week   Plan Discharge plan needs to be updated;Frequency remains appropriate    Precautions / Restrictions Precautions Precautions: Fall Required Braces or Orthoses: Other Brace/Splint Other Brace/Splint: [name] RLE Restrictions Weight Bearing Restrictions: Yes RLE Weight Bearing: Partial weight bearing RLE Partial Weight Bearing Percentage or Pounds: Not indicated in order   Pertinent Vitals/Pain Pain limiting mobility today.    Mobility  Bed Mobility Bed Mobility: Rolling Left;Left Sidelying to Sit;Sitting - Scoot to [place] of Bed Rolling Left: 2: Max assist Left Sidelying to Sit: 1: +2 Total assist;With rails;HOB flat Left Sidelying to Sit: Patient Percentage: 40% Sitting - Scoot to Edge of Bed: 3: Mod assist Details for Bed Mobility Assistance: Verbal cues for technique.  Physical assist to reach for rail with RUE - Difficulty reaching due to surgery site.  Transfers Transfers: Sit to Stand;Stand to Sit;Stand Pivot Transfers Sit to Stand: 1: +2 Total assist;With upper extremity assist;From bed;From elevated surface Sit to Stand: Patient Percentage: 30% Stand to Sit: 1: +2 Total assist;With upper extremity assist;With armrests;To  chair/3-in-1 Stand to Sit: Patient Percentage: 50% Stand Pivot Transfers: 1: +2 Total assist Stand Pivot Transfers: Patient Percentage: 40% Details for Transfer Assistance: Verbal cues for PWB RLE with [name].  Verbal cues for hand placement.  Patient with great difficulty moving to standing from elevated bed.  Attempted x 3. Once standing, patient able to take 2-3 steps to pivot to chair. Ambulation/Gait Ambulation/Gait Assistance: Not tested (comment)      PT Goals Acute Rehab PT Goals PT Goal: Supine/Side to Sit - Progress: Progressing toward goal PT Goal: Sit to Stand - Progress: Progressing toward goal PT Transfer Goal: Bed to Chair/Chair to Bed - Progress: Progressing toward goal  Visit Information  Last PT Received On: 06/20/12 Assistance Needed: +2    Subjective Data  Subjective: "My leg hurts from moving around today" (To BSC)   Cognition  Overall Cognitive Status: Appears within functional limits for tasks assessed/performed Arousal/Alertness: Awake/alert Orientation Level: Oriented X4 / Intact Behavior During Session: [facility] for tasks performed Cognition - Other Comments: At times, overestimates his abilities.    Balance     End of Session PT - End of Session Equipment Utilized During Treatment: Gait belt;Other (comment) ([name]) Activity Tolerance: Patient limited by pain;Patient limited by fatigue Patient left: in chair;with call bell/phone within reach;with family/visitor present Nurse Communication: Mobility status;Need for lift equipment;Weight bearing status   GP     Vena Austria 06/20/2012, 1:23 PM Durenda Hurt. Renaldo Fiddler, [facility] Acute Rehab Services Pager [phone]

## 2012-06-21 ENCOUNTER — Encounter (HOSPITAL_COMMUNITY): Payer: Self-pay | Admitting: Vascular Surgery

## 2012-06-21 ENCOUNTER — Other Ambulatory Visit: Payer: Self-pay | Admitting: Family Medicine

## 2012-06-21 DIAGNOSIS — Z48812 Encounter for surgical aftercare following surgery on the circulatory system: Secondary | ICD-10-CM

## 2012-06-21 LAB — CBC
MCV: 89.7 fL (ref 78.0–100.0)
Platelets: 147 10*3/uL — ABNORMAL LOW (ref 150–400)
RDW: 14.9 % (ref 11.5–15.5)
WBC: 13.4 10*3/uL — ABNORMAL HIGH (ref 4.0–10.5)

## 2012-06-21 LAB — RENAL FUNCTION PANEL
Albumin: 2.5 g/dL — ABNORMAL LOW (ref 3.5–5.2)
Calcium: 8.7 mg/dL (ref 8.4–10.5)
Chloride: 109 mEq/L (ref 96–112)
Creatinine, Ser: 4.37 mg/dL — ABNORMAL HIGH (ref 0.50–1.35)
GFR calc Af Amer: 16 mL/min — ABNORMAL LOW (ref >90)
GFR calc non Af Amer: 14 mL/min — ABNORMAL LOW (ref >90)
Sodium: 142 mEq/L (ref 135–145)

## 2012-06-21 LAB — GLUCOSE, CAPILLARY: Glucose-Capillary: 150 mg/dL — ABNORMAL HIGH (ref 70–99)

## 2012-06-21 MED ORDER — CIPROFLOXACIN HCL 500 MG PO TABS
500.0000 mg | ORAL_TABLET | Freq: Every day | ORAL | Status: DC
  Administered 2012-06-21 – 2012-06-24 (×4): 500 mg via ORAL
  Filled 2012-06-21 (×5): qty 1

## 2012-06-21 MED ORDER — CEPHALEXIN 500 MG PO CAPS
500.0000 mg | ORAL_CAPSULE | Freq: Three times a day (TID) | ORAL | Status: DC
  Filled 2012-06-21 (×3): qty 1

## 2012-06-21 MED ORDER — OXYCODONE-ACETAMINOPHEN 5-325 MG PO TABS
1.0000 | ORAL_TABLET | ORAL | Status: DC | PRN
  Administered 2012-06-23 (×2): 1 via ORAL
  Filled 2012-06-21 (×2): qty 1

## 2012-06-21 MED ORDER — OXYCODONE-ACETAMINOPHEN 7.5-325 MG PO TABS: 1.0000 | ORAL_TABLET | ORAL | Status: DC | PRN

## 2012-06-21 MED ORDER — OXYCODONE HCL 5 MG PO TABS
5.0000 mg | ORAL_TABLET | ORAL | Status: DC | PRN
  Administered 2012-06-21 – 2012-06-22 (×4): 10 mg via ORAL
  Administered 2012-06-22: 5 mg via ORAL
  Administered 2012-06-22 – 2012-06-24 (×5): 10 mg via ORAL
  Filled 2012-06-21 (×10): qty 2

## 2012-06-21 MED ORDER — TAMSULOSIN HCL 0.4 MG PO CAPS
0.8000 mg | ORAL_CAPSULE | Freq: Every day | ORAL | Status: DC
  Administered 2012-06-22 – 2012-06-24 (×3): 0.8 mg via ORAL
  Filled 2012-06-21 (×3): qty 2

## 2012-06-21 MED ORDER — LIDOCAINE HCL 2 % EX GEL
Freq: Once | CUTANEOUS | Status: AC
  Administered 2012-06-21: 14:00:00 via URETHRAL
  Filled 2012-06-21: qty 20

## 2012-06-21 MED ORDER — CEPHALEXIN 500 MG PO CAPS
500.0000 mg | ORAL_CAPSULE | Freq: Two times a day (BID) | ORAL | Status: DC
  Filled 2012-06-21 (×2): qty 1

## 2012-06-21 MED ORDER — HYDROMORPHONE HCL PF 1 MG/ML IJ SOLN: 1.0000 mg | INTRAMUSCULAR | Status: DC | PRN

## 2012-06-21 NOTE — Progress Notes (Signed)
Patient ID: John [name], male   DOB: Mar 24, 1956, 57 y.o.   MRN: [id number]    [facility] Progress Note    Subjective:   Reports to be feeling well, states fair pain control on PCA. Denies any chest pain or shortness of breath. Wife reports that he is "mentally at baseline". Still having difficulties with passing urine and requires intermittent catheterizations.    Objective:   BP 148/64  Pulse 105  Temp 98.4 F (36.9 C) (Oral)  Resp 18  Ht 5\' 11"  (1.803 m)  Wt 84.3 kg (185 lb 13.6 oz)  BMI 25.92 kg/m2  SpO2 100%  Intake/Output Summary (Last 24 hours) at 06/21/12 0732 Last data filed at 06/20/12 1700  Gross per 24 hour  Intake    120 ml  Output    502 ml  Net   -382 ml   Weight change:   Physical Exam: Gen: Uncomfortably resting in bed CVS: Pulse regular tachycardia, heart sounds S1 and S2 with an ejection systolic murmur Resp: Clear to auscultation bilaterally, no rales/rhonchi Abd: Soft, obese, nontender, bowel sounds are normal Ext: Right lower extremity dressing noted-clean/dry  Imaging: No results found.  Labs: BMET  Lab 06/20/12 1737 06/20/12 0700 06/20/12 0004 06/19/12 2035 06/19/12 1700 06/19/12 1239 06/19/12 0814 06/19/12 0506 06/18/12 0630 06/17/12 2023 06/17/12 0625 06/16/12 0858  NA 143 141 -- 142 -- -- -- 144 146* 144 143 --  K 5.0 5.7* 5.7* 6.0* 5.6* 5.8* 5.9* -- -- -- -- --  CL 109 109 -- 109 -- -- -- 114* 114* 111 110 --  CO2 20 17* -- 20 -- -- -- 19 21 21 19  --  GLUCOSE 137* 77 -- 93 -- -- -- 108* 152* 99 77 --  BUN 62* 63* -- 58* -- -- -- 54* 65* 68* 73* --  CREATININE 4.57* 4.52* -- 4.40* -- -- -- 3.56* 3.65* 4.01* 4.29* --  ALB -- -- -- -- -- -- -- -- -- -- -- --  CALCIUM 8.5 8.4 -- 8.2* -- -- -- 8.1* 8.5 8.8 8.4 --  PHOS -- -- -- -- -- -- -- -- -- -- -- 5.1*   CBC  Lab 06/20/12 0700 06/19/12 0506 06/18/12 0630 06/17/12 0625 06/15/12 2356  WBC 11.6* 12.4* 9.1 8.9 --  NEUTROABS -- -- -- -- 7.4  HGB 8.1* 9.3* 9.9* 10.6* --    HCT 26.5* 29.2* 30.9* 33.8* --  MCV 90.1 89.6 88.8 88.9 --  PLT 143* 178 199 209 --    Medications:      . amLODipine  5 mg Oral Daily  . aspirin  81 mg Oral Daily  . baclofen  10 mg Oral BID  . darbepoetin (ARANESP) injection - DIALYSIS  100 mcg Subcutaneous Q Mon-HD  . docusate sodium  100 mg Oral BID  . heparin  5,000 Units Subcutaneous Q8H  . HYDROmorphone PCA 0.3 mg/mL   Intravenous Q4H  . insulin aspart  0-15 Units Subcutaneous TID WC  . insulin aspart  0-5 Units Subcutaneous QHS  . insulin glargine  20 Units Subcutaneous Daily  . metoprolol succinate  25 mg Oral Daily  . nicotine  7 mg Transdermal Daily  . nortriptyline  10 mg Oral QHS  . pantoprazole  40 mg Oral Daily  . simvastatin  20 mg Oral q1800  . sodium bicarbonate  650 mg Oral BID  . Tamsulosin HCl  0.4 mg Oral Daily     Assessment/ Plan:   1. Chronic kidney disease  stage IV: Mild acute renal insufficiency postoperatively with associated problems with hyperkalemia that has been medically managed successfully. No acute indications for hemodialysis at this time, no uremic symptoms present. Would benefit from placement of permanent dialysis access in the near future anticipating dialysis over the next few months. The big difficulty appears to be the anticholinergic effects postanesthesia impairing urinary drainage. Continue to monitor for intermittent catheterization needs/bladder training. 2. Right foot ischemia: Status post right axillofemoral and right femoral-popliteal bypass graft with amputation right fourth and fifth toes. 3. Anemia: Appears to be anemia of chronic kidney disease compounded by postoperative loss. On Aranesp-anticipated dose today. 4. Diabetes mellitus: On insulin, anticipate some postoperative difficulties in control secondary to "stress". 5. Metabolic acidosis: Secondary to progressive CK D. versus renal tubular acidosis of diabetes-started on oral sodium bicarbonate with good improvement of  bicarbonate level.  Zetta Bills, MD 06/21/2012, 7:32 AM

## 2012-06-21 NOTE — Progress Notes (Addendum)
[facility] [place]  Progress Note Bypass Surgery  Date of Surgery: 06/15/2012 - 06/18/2012  Procedure(s): BYPASS GRAFT AXILLA-BIFEMORAL BYPASS GRAFT FEMORAL-POPLITEAL ARTERY AMPUTATION DIGIT Surgeon: Surgeon(s): Chuck Hint, MD Pryor Ochoa, MD  3 Days Post-Op  History of Present Illness  John [name] is a 57 y.o. male who is S/P Procedure(s): BYPASS GRAFT AXILLA-BIFEMORAL BYPASS GRAFT FEMORAL-POPLITEAL ARTERY AMPUTATION DIGIT right.  The patient's pre-op symptoms of pain and gangrene toes are Improved . Patients pain is well controlled. Feel he can come off PCA   VASC. LAB Studies:        ABI: Pending   Imaging: No results found.  Significant Diagnostic Studies: CBC Lab Results  Component Value Date   WBC 13.4* 06/21/2012   HGB 9.4* 06/21/2012   HCT 29.6* 06/21/2012   MCV 89.7 06/21/2012   PLT 147* 06/21/2012    BMET     Component Value Date/Time   NA 142 06/21/2012 0700   K 4.9 06/21/2012 0700   CL 109 06/21/2012 0700   CO2 18* 06/21/2012 0700   GLUCOSE 167* 06/21/2012 0700   BUN 64* 06/21/2012 0700   CREATININE 4.37* 06/21/2012 0700   CREATININE 3.17* 04/21/2012 1037   CALCIUM 8.7 06/21/2012 0700   CALCIUM 9.0 01/21/2008 0000   GFRNONAA 14* 06/21/2012 0700   GFRAA 16* 06/21/2012 0700    COAG Lab Results  Component Value Date   INR 0.98 12/10/2011   No results found for this basename: PTT    Physical Examination  BP Readings from Last 3 Encounters:  06/21/12 148/64  06/21/12 148/64  06/11/12 117/60   Temp Readings from Last 3 Encounters:  06/21/12 98.4 F (36.9 C) Oral  06/21/12 98.4 F (36.9 C) Oral  06/11/12 98.2 F (36.8 C) Oral   SpO2 Readings from Last 3 Encounters:  06/21/12 100%  06/21/12 100%  06/11/12 99%   Pulse Readings from Last 3 Encounters:  06/21/12 105  06/21/12 105  06/11/12 93    Pt is A&O x 3 right upper, right lower extremity: Incision/s is/are clean,dry.intact, and  healing without  hematoma, erythema or drainage Limb is warm; with good color  Assessment/Plan: Pt. Doing well Post-op pain is controlled on PCA - prob DC today Wounds are healing well PT/OT for ambulation Continue wound care as ordered Will review pain meds and get pt on pre-op meds which he gets from His PCP, Dr. [name], for chronic pain syndrome MD to review Ven mapping and decide on timing of HD access - poss. left BC VS BVBT  [name] [phone] 06/21/2012 8:19 AM  Agree with above.   1. D/C PCA 2. PTx PWB right foot heal only. He needs [name]. The one in his room is not the right shoe.  3. Will add hydrogel to wound to keep wound moist. Will need Home Health to do dressing changes once he is discharge.  4. D/C Zosyn. Start po Cipro (culture grew pseudomonas) 5. I have consulted HHPT 6. Ween O2 off.   Cari Caraway Beeper [phone] 06/21/2012

## 2012-06-21 NOTE — Progress Notes (Signed)
Orthopedic Tech Progress Note Patient Details:  John [name] [birth year] [id number]  Ortho Devices Type of Ortho Device: Other (comment) (darco shoe) Ortho Device/Splint Location: (R) LE Ortho Device/Splint Interventions: Application   Jennye Moccasin 06/21/2012, 4:32 PM

## 2012-06-21 NOTE — Progress Notes (Signed)
VASCULAR LAB PRELIMINARY  ARTERIAL  ABI completed:    RIGHT    LEFT    PRESSURE WAVEFORM  PRESSURE WAVEFORM  BRACHIAL Pink band  BRACHIAL 161 T  DP   DP    AT 123 M AT 79 DM  PT 148 M PT 85 DM  PER   PER    GREAT TOE  NA GREAT TOE  NA    RIGHT LEFT  ABI 0.92 0.53     Aydian Dimmick, RVT 06/21/2012, 10:08 AM

## 2012-06-21 NOTE — Progress Notes (Signed)
Family Medicine Teaching Service Daily Progress Note Service Page: [phone]  Pt assessment:  57 y.o. 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   Subjective:  Pain well controlled this am, no complaints currently. Nicotine patch helping his cravings.  \ Objective: Temp:  [98.4 F (36.9 C)-99.4 F (37.4 C)] 98.4 F (36.9 C) (01/13 0504) Pulse Rate:  [85-110] 105  (01/13 0504) Resp:  [11-22] 18  (01/13 0504) BP: (143-178)/(64-87) 148/64 mmHg (01/13 0538) SpO2:  [94 %-100 %] 100 % (01/13 0504) FiO2 (%):  [39 %-40 %] 40 % (01/13 0502)   Intake/Output Summary (Last 24 hours) at 06/21/12 0706 Last data filed at 06/20/12 1700  Gross per 24 hour  Intake    120 ml  Output    502 ml  Net   -382 ml   Physical Exam: General: resting comfortably in bed. NAD, Clean dry, intact incision on  R leg and R chest Cardiovascular: RRR. No murmurs, rubs, or gallops. Respiratory: CTAB. No rales, rhonchi, or wheezing. Abdomen: soft, nontender, nondistended. No organomegaly appreciated. Extremities: R foot with clean, dry intact dressing. Biphasic PT pulse with doppler on R, R extremity warm.  Neuro: No focal deficits.  CBC BMET   Lab 06/20/12 0700 06/19/12 0506 06/18/12 0630  WBC 11.6* 12.4* 9.1  HGB 8.1* 9.3* 9.9*  HCT 26.5* 29.2* 30.9*  PLT 143* 178 199    Lab 06/20/12 1737 06/20/12 0700 06/20/12 0004 06/19/12 2035  NA 143 141 -- 142  K 5.0 5.7* 5.7* --  CL 109 109 -- 109  CO2 20 17* -- 20  BUN 62* 63* -- 58*  CREATININE 4.57* 4.52* -- 4.40*  GLUCOSE 137* 77 -- 93  CALCIUM 8.5 8.4 -- 8.2*     Imaging/Diagnostic Tests:  Lower extremity Arterial Duplex Summary: Probableaorto-iliac disease, with severe iliac disease on right and moderately severe on left. The right common femoral artery is patent with  monophasic flow. The left common femoral artery appears to be stenotic with elevated velocities. The right femoral artery appears to be occluded in its mid segment with multi-collateral flow supplying the distal femoral artery and popliteal artery. The left femoral artery appears to be occluded just past its origin with multi-collateral flow supplying the distal femoral artery and popliteal artery. There is three vessel runoff bilaterally. There is possible moderate disease of the right anterior tibial artery and proximal left peroneal artery. The right external iliac artery demonstrates severely dampened monophasic flow. The left external iliac artery exhibits monophasic flow. Unable to visualize bilateral common iliac arteries and aorta.  ABI: Unable to insonate bilateral dorsalis pedis arteries, as well as the right posterior tibial artery. The left ABI=0.34, which is indicative of severe arterial insufficiency. Unable to calculate right ABI.  US Renal 06/16/2012 IMPRESSION:  1. Negative for hydronephrosis or acute abnormality.  2. Increased cortical echogenicity compatible with medical renal  disease.  3. Bilateral renal cysts as described  Dg Toe 4th Right 06/15/2012 IMPRESSION:  Diffuse soft tissue swelling. No plain film findings of  Osteomyelitis.  MR/MAR Abdomen, pelvis, BL LE without 06/17/2012 IMPRESSION:  1. High-grade stenosis or a short segment occlusion at the aortic  bifurcation extending a short segment into bilateral proximal  common iliac arteries.  2. Bilateral SFA occlusions with popliteal reconstitution.  3. Contiguous left anterior tibial  runoff to the foot.  4. Diseased right posterior tibial and peroneal runoff across the  right ankle.  Assessment/Plan: John [name] is a 57 y.o. 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 Now S/p bypass and amuputation post op day 2.    Ischemic Necrosis of Toes  - Vascular surgery on board- appreciate reccs and treatment  - Considering placing A/V fistula per renal reccs, vascular mapping complete, will decide when to place AV graft - Amputation and revascularization by VVS on 06/18/2012  R Ax-fem bypass, R Fem-pop bypass, Amputation of 4th and 5th digits.  - Dressing clean, dry intact, biphasic PT pulse with doppler.  - Pain controlled well on current regimen - dilaudid PCA   Acute on chronic kidney disease - improving - Patient has CKD stage 4 (baseline Creatinine 3.8) and had acute rise in creatinine to 5.18 on admission. - Renal following - appreciate reccs  -  Patient will need placement of AVF per renal reccs - Creatinine worsened today - up to 4.5 from 3.5 on 1/11, K 4.9 today  - UOP 1 L on 1/11 - will continue to monitor closely   Hyperkalemia  - 4.9 this am, Kayexelate 30 G X 2 on 1/12 - Will continue to follow closely and use kayexalate as needed - repeat BMP at 1700  Prostatic Hyperplasia - Flomax daily, bladder scan showed 990 mL straight cathed and 500 mL out - liklely need urology f/u OP - avoid anticholinergics- namely benedryl  DM  - SSI and Lantus 20 Units decrease to 15 - Good control at this time -  CBG's 84-150 - decrease lantus to 15 units daily as to prevent hypoglycemia  HTN  - Elevated 143-178/78-87, likely partially elevated due to pain - Cont amlodipine 10 mg, metoprolol succinate 25 mg - consider increasing metoprolol with P 85-110  Smoking cessation - has cut down to 1/2 ppd and wants to quit.  - nicotine patch helping - f/u OP with our smoking cessation specialist pharmacist  FENGI: regular diet, NS @ 100 PPX: Heparin sub Q  DISPO: Patient to undergo revascularization and amputation as well as placement of AVF. CODE STATUS: full code  Kevin Fenton, MD 06/21/2012, 7:06 AM

## 2012-06-21 NOTE — Progress Notes (Signed)
I discussed with  Dr Bradshaw.  I agree with their plans documented in their progress note for today.  

## 2012-06-21 NOTE — Progress Notes (Signed)
Physical Therapy Treatment Patient Details Name: John [name] MRN: [id number] DOB: January 06, 1956 Today's Date: 06/21/2012 Time: 7829-5621 PT Time Calculation (min): 25 min  PT Assessment / Plan / Recommendation Comments on Treatment Session  Pt s/p fem pop bypass and toe amputations.  Will benefit from continued PT to address safety and balance with mobility.  Continues to need SNF for therapy.      Follow Up Recommendations  SNF;Supervision/Assistance - 24 hour                 Equipment Recommendations  None recommended by PT        Frequency Min 4X/week   Plan Discharge plan remains appropriate;Frequency remains appropriate    Precautions / Restrictions Precautions Precautions: Fall Required Braces or Orthoses: Other Brace/Splint Other Brace/Splint: [name] RLE ordered, used prior cast shoe in interim until Darko arrives.  Restrictions Weight Bearing Restrictions: Yes RLE Weight Bearing: Partial weight bearing RLE Partial Weight Bearing Percentage or Pounds: Not indicated in order   Pertinent Vitals/Pain VSS, Some pain    Mobility  Bed Mobility Bed Mobility: Rolling Left;Left Sidelying to Sit Rolling Left: 4: Min guard Left Sidelying to Sit: 4: Min guard Supine to Sit: Not tested (comment) Sitting - Scoot to Edge of Bed: Not tested (comment) Sit to Supine: Not Tested (comment) Details for Bed Mobility Assistance: verbal cues for technique and hand placement.   Transfers Transfers: Sit to Stand;Stand to Sit Sit to Stand: 4: Min assist;With upper extremity assist;From bed Stand to Sit: 4: Min assist;With upper extremity assist;To chair/3-in-1;With armrests Stand Pivot Transfers: Not tested (comment) Details for Transfer Assistance: Pt needed verbal cues for hand placement as well as for technique.  Verbal cues throughout for patient to maintain PWB RLE.  Pt weight bears on heel but at times sequences steps wrong and rocks onto toes.    Ambulation/Gait Ambulation/Gait Assistance: 4: Min assist Ambulation Distance (Feet): 45 Feet Assistive device: Rolling walker Ambulation/Gait Assistance Details: Pt able to achieve upright stance.  Pt with abnormal gait pattern unless cued.  He has decr step length on the right LE at times and this causes him to place too much weight on his toes.  Needs cues for sequencing and technique.  Pt ambulating much better today. Needed cueing for [facility] RLE as he may have been putting more than 1/2 his weight at times.   Gait Pattern: Step-to pattern;Decreased stride length;Decreased step length - right;Trunk flexed Gait velocity: decreased Stairs: No Wheelchair Mobility Wheelchair Mobility: No    Exercises General Exercises - Lower Extremity Long Arc Quad: AROM;Both;10 reps;Seated Heel Slides: AROM;Both;10 reps;Seated    PT Goals Acute Rehab PT Goals PT Goal: Supine/Side to Sit - Progress: Progressing toward goal PT Goal: Sit to Stand - Progress: Progressing toward goal PT Goal: Ambulate - Progress: Progressing toward goal  Visit Information  Last PT Received On: 06/21/12 Assistance Needed: +2    Subjective Data  Subjective: "My leg hurts but I am trying more now." Patient Stated Goal: To go home   Cognition  Overall Cognitive Status: Appears within functional limits for tasks assessed/performed Arousal/Alertness: Awake/alert Orientation Level: Oriented X4 / Intact Behavior During Session: [facility] for tasks performed Cognition - Other Comments: Overestimates his abilities.      Balance  Static Sitting Balance Static Sitting - Level of Assistance: Not tested (comment) Dynamic Standing Balance Dynamic Standing - Balance Support: Bilateral upper extremity supported;During functional activity Dynamic Standing - Level of Assistance: 4: Min assist  End of  Session PT - End of Session Equipment Utilized During Treatment: Gait belt;Oxygen (cast shoe) Activity Tolerance: Patient tolerated  treatment well;Patient limited by fatigue Patient left: in chair;with call bell/phone within reach;with family/visitor present Nurse Communication: Mobility status;Weight bearing status       INGOLD,Jeraldine Primeau 06/21/2012, 10:34 AM  [facility] [phone] (pager)

## 2012-06-21 NOTE — Plan of Care (Signed)
Problem: Phase I Progression Outcomes Goal: Voiding-avoid urinary catheter unless indicated Outcome: Completed/Met Date Met:  06/21/12 Catheter for retention

## 2012-06-21 NOTE — Progress Notes (Signed)
ANTIBIOTIC CONSULT NOTE - INITIAL  Pharmacy Consult for Cipro Indication: Gangrene of foot  Allergies  Allergen Reactions  . Gabapentin Other (See Comments)    Can't walk, shuts legs down  . Vicodin (Hydrocodone-Acetaminophen) Hives and Itching    Patient Measurements: Height: 5\' 11"  (180.3 cm) Weight: 185 lb 13.6 oz (84.3 kg) IBW/kg (Calculated) : 75.3  Adjusted Body Weight:    Vital Signs: Temp: 98.4 F (36.9 C) (01/13 0504) Temp src: Oral (01/13 0504) BP: 148/64 mmHg (01/13 0538) Pulse Rate: 105  (01/13 0504) Intake/Output from previous day: 01/12 0701 - 01/13 0700 In: 120 [P.O.:120] Out: 502 [Urine:500; Stool:2] Intake/Output from this shift: Total I/O In: 240.9 [P.O.:240; I.V.:0.9] Out: -   Labs:  Basename 06/21/12 0700 06/20/12 1737 06/20/12 0700 06/19/12 0506  WBC 13.4* -- 11.6* 12.4*  HGB 9.4* -- 8.1* 9.3*  PLT 147* -- 143* 178  LABCREA -- -- -- --  CREATININE 4.37* 4.57* 4.52* --   Estimated Creatinine Clearance: 19.9 ml/min (by C-G formula based on Cr of 4.37). No results found for this basename: VANCOTROUGH:2,VANCOPEAK:2,VANCORANDOM:2,GENTTROUGH:2,GENTPEAK:2,GENTRANDOM:2,TOBRATROUGH:2,TOBRAPEAK:2,TOBRARND:2,AMIKACINPEAK:2,AMIKACINTROU:2,AMIKACIN:2, in the last 72 hours   Microbiology: Recent Results (from the past 720 hour(s))  SURGICAL PCR SCREEN     Status: Normal   Collection Time   06/18/12  1:30 AM      Component Value Range Status Comment   MRSA, PCR NEGATIVE  NEGATIVE Final    Staphylococcus aureus NEGATIVE  NEGATIVE Final   WOUND CULTURE     Status: Normal   Collection Time   06/18/12 12:45 PM      Component Value Range Status Comment   Specimen Description WOUND FOOT RIGHT   Final    Special Requests     Final    Value: SWAB FROM AMPUTATION SITE RT FOURTH AND FIFTH TOES PT ON ANCEF   Gram Stain     Final    Value: NO WBC SEEN     RARE SQUAMOUS EPITHELIAL CELLS PRESENT     FEW GRAM NEGATIVE RODS   Culture MODERATE PSEUDOMONAS AERUGINOSA    Final    Report Status 06/21/2012 FINAL   Final    Organism ID, Bacteria PSEUDOMONAS AERUGINOSA   Final     Medical History: Past Medical History  Diagnosis Date  . COPD (chronic obstructive pulmonary disease)   . Chronic back pain     Chronic narcotics for this for many years  . Diabetes mellitus   . Hyperlipidemia   . Hypertension   . Foot pain   . Peripheral vascular disease   . Asthma   . Stroke     No residual "Mini strokes"  . Ulcer 1985  . Fracture of foot     Compond- Right  . Chronic kidney disease     CKD stage III  . Neuromuscular disorder     periferal neuropathy    Assessment: S/p peripheral bypass surgery, amp of 4th and 5th toes on right foot d/t gangrene  Infectious Diseases: S/p right axillofemoral and right fem-pop bypass graft with right 4th and 5th toes amputation on 1/10 for gangrene; Scr 4.37 trending up ,CrCl ~19; T 99.4, wbc 13.4 (Up) 1/13 Cipro>> 1/10 zosyn >> 1/13  1/10 right foot wound>> pseudomonas (sensitivities Pending)  Plan:  Change Zosyn to Cipro 500mg  po once daily adjusted for renal function.   Merilynn Finland, [employer] 06/21/2012,10:23 AM

## 2012-06-21 NOTE — Progress Notes (Signed)
Rehab Admissions Coordinator Note:  Patient was screened by Clois Dupes for appropriateness for an Inpatient Acute Rehab Consult.  At this time, we are recommending Skilled Nursing Facility. [employer] Medicare will not approve an inpt rehab admission for  LE bypass with amputation of toes. Please call me with any questions.   Clois Dupes, RN 06/21/2012, 9:07 AM  I can be reached at [phone].

## 2012-06-22 LAB — CBC
HCT: 25.1 % — ABNORMAL LOW (ref 39.0–52.0)
MCHC: 31.9 g/dL (ref 30.0–36.0)
MCV: 89 fL (ref 78.0–100.0)
RDW: 14.6 % (ref 11.5–15.5)
WBC: 9.9 10*3/uL (ref 4.0–10.5)

## 2012-06-22 LAB — TYPE AND SCREEN
Antibody Screen: NEGATIVE
Unit division: 0

## 2012-06-22 LAB — RENAL FUNCTION PANEL
Albumin: 2.4 g/dL — ABNORMAL LOW (ref 3.5–5.2)
BUN: 71 mg/dL — ABNORMAL HIGH (ref 6–23)
Chloride: 107 mEq/L (ref 96–112)
Creatinine, Ser: 4.51 mg/dL — ABNORMAL HIGH (ref 0.50–1.35)

## 2012-06-22 LAB — GLUCOSE, CAPILLARY
Glucose-Capillary: 136 mg/dL — ABNORMAL HIGH (ref 70–99)
Glucose-Capillary: 113 mg/dL — ABNORMAL HIGH (ref 70–99)
Glucose-Capillary: 143 mg/dL — ABNORMAL HIGH (ref 70–99)
Glucose-Capillary: 76 mg/dL (ref 70–99)

## 2012-06-22 MED ORDER — METOPROLOL SUCCINATE ER 50 MG PO TB24
50.0000 mg | ORAL_TABLET | Freq: Every day | ORAL | Status: DC
  Administered 2012-06-22 – 2012-06-23 (×2): 50 mg via ORAL
  Filled 2012-06-22 (×3): qty 1

## 2012-06-22 NOTE — Progress Notes (Signed)
I have seen and examined this patient. I have discussed with Dr Bradshaw.  I agree with their findings and plans as documented in their progress note.    

## 2012-06-22 NOTE — Progress Notes (Signed)
Family Medicine Teaching Service Daily Progress Note Service Page: [phone]  Pt assessment:  57 y.o. 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   Subjective:  Pain controlled well this am, reports some confusion overnight that is resolved now. No chest pain, no dyspnea   Objective: Temp:  [98.4 F (36.9 C)-98.6 F (37 C)] 98.6 F (37 C) (01/14 0449) Pulse Rate:  [95-105] 95  (01/14 0449) Resp:  [16-20] 18  (01/14 0449) BP: (136-170)/(62-114) 136/62 mmHg (01/14 0617) SpO2:  [93 %-100 %] 93 % (01/14 0449)   Intake/Output Summary (Last 24 hours) at 06/22/12 0923 Last data filed at 06/22/12 0449  Gross per 24 hour  Intake      0 ml  Output   2150 ml  Net  -2150 ml   Physical Exam: General: resting comfortably in bed. NAD, Clean dry, intact incision on  R leg and R chest Cardiovascular: RRR. No murmurs, rubs, or gallops. Respiratory: CTAB. No rales, rhonchi, or wheezing. Abdomen: soft, nontender, nondistended. No organomegaly appreciated. Extremities: R foot with clean, dry intact dressing. Biphasic PT pulse with doppler on R, R extremity warm.  Neuro: No focal deficits.  CBC BMET   Lab 06/22/12 0856 06/22/12 0649 06/21/12 0700 06/20/12 0700  WBC -- 9.9 13.4* 11.6*  HGB 8.9* 8.0* 9.4* --  HCT 28.4* 25.1* 29.6* --  PLT -- 171 147* 143*    Lab 06/22/12 0649 06/21/12 0700 06/20/12 1737  NA 141 142 143  K 4.4 4.9 5.0  CL 107 109 109  CO2 20 18* 20  BUN 71* 64* 62*  CREATININE 4.51* 4.37* 4.57*  GLUCOSE 168* 167* 137*  CALCIUM 8.4 8.7 8.5     Imaging/Diagnostic Tests:  Lower extremity Arterial Duplex Summary: Probableaorto-iliac disease, with severe iliac disease on right and moderately severe on left. The right common femoral artery is patent with monophasic flow. The left common femoral artery  appears to be stenotic with elevated velocities. The right femoral artery appears to be occluded in its mid segment with multi-collateral flow supplying the distal femoral artery and popliteal artery. The left femoral artery appears to be occluded just past its origin with multi-collateral flow supplying the distal femoral artery and popliteal artery. There is three vessel runoff bilaterally. There is possible moderate disease of the right anterior tibial artery and proximal left peroneal artery. The right external iliac artery demonstrates severely dampened monophasic flow. The left external iliac artery exhibits monophasic flow. Unable to visualize bilateral common iliac arteries and aorta.  ABI: Unable to insonate bilateral dorsalis pedis arteries, as well as the right posterior tibial artery. The left ABI=0.34, which is indicative of severe arterial insufficiency. Unable to calculate right ABI.  US Renal 06/16/2012 IMPRESSION:  1. Negative for hydronephrosis or acute abnormality.  2. Increased cortical echogenicity compatible with medical renal  disease.  3. Bilateral renal cysts as described  Dg Toe 4th Right 06/15/2012 IMPRESSION:  Diffuse soft tissue swelling. No plain film findings of  Osteomyelitis.  MR/MAR Abdomen, pelvis, BL LE without 06/17/2012 IMPRESSION:  1. High-grade stenosis or a short segment occlusion at the aortic  bifurcation extending a short segment into bilateral proximal  common iliac arteries.  2. Bilateral SFA occlusions with popliteal reconstitution.  3. Contiguous left anterior tibial runoff to the foot.  4. Diseased right posterior  tibial and peroneal runoff across the  right ankle.  Assessment/Plan: John [name] is a 57 y.o. 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 Now S/p bypass and amuputation post op day 4  Ischemic Necrosis of Toes  - Vascular surgery  on board- appreciate reccs and treatment  - A/V fistula per renal reccs on friday, vascular mapping complete - Amputation and revascularization by VVS on 06/18/2012  R Ax-fem bypass, R Fem-pop bypass, Amputation of 4th and 5th digits.  - Wound culture grew pan-sensitive pseudomonas - Initially started on IV zosyn post op, transitioned now to Cipro   - total duration of Tx- 5 - Dressing clean, dry intact - Pain controlled well on current regimen - PRN oxycodone and PRN dilaudid  Acute on chronic kidney disease - improving - Patient has CKD stage 4 (baseline Creatinine 3.8) and had acute rise in creatinine to 5.18 on admission. - Renal following - appreciate reccs  - Patient will need placement of AVF per renal reccs- VVS planning placement on friday  - Sodium bicarb for metabolic acidosis 2/2 progressive CKD vs RTA of diabetes - Creatinine steady around 4.5  - UOP 2 L on 1/11, Negative 5.4 L for admission.  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  - nicotine patch helping - f/u OP with our smoking cessation specialist pharmacist  FENGI: regular diet PPX: Heparin sub Q  DISPO: Patient to undergo  revascularization and amputation as well as placement of AVF. CODE STATUS: full code  Kevin Fenton, MD 06/22/2012, 9:23 AM

## 2012-06-22 NOTE — Progress Notes (Signed)
Physical Therapy Treatment Patient Details Name: John [name] MRN: [id number] DOB: [birth year] Today's Date: 06/22/2012 Time: 4034-7425 PT Time Calculation (min): 25 min  PT Assessment / Plan / Recommendation Comments on Treatment Session  Pt s/p fem pop bypass and toe amputations.  Will benefit from continued PT to address safety and balance with mobility.  Continues to need SNF for therapy to gain independence and safety.      Follow Up Recommendations  SNF;Supervision/Assistance - 24 hour                 Equipment Recommendations  None recommended by PT        Frequency Min 4X/week   Plan Discharge plan remains appropriate;Frequency remains appropriate    Precautions / Restrictions Precautions Precautions: Fall Precaution Comments: altered gait pattern Other Brace/Splint: [name] right LE in place Restrictions RLE Weight Bearing: Partial weight bearing   Pertinent Vitals/Pain VSS, Some pain    Mobility  Bed Mobility Bed Mobility: Rolling Right;Right Sidelying to Sit Rolling Right: 5: Supervision Rolling Left: Not tested (comment) Right Sidelying to Sit: 5: Supervision Left Sidelying to Sit: Not tested (comment) Supine to Sit: Not tested (comment) Sitting - Scoot to Edge of Bed: Not tested (comment) Sit to Supine: Not Tested (comment) Details for Bed Mobility Assistance: Technique improved Transfers Transfers: Sit to Stand;Stand to Sit Sit to Stand: 5: Supervision;With upper extremity assist;From bed Stand to Sit: With upper extremity assist;4: Min assist;With armrests;To chair/3-in-1 Stand Pivot Transfers: Not tested (comment) Details for Transfer Assistance: Pt needed verbal cues for hand placement and needed assist for sit to stand to complete movement as well as needed asssist to control descent into chair.  Verbal cues throughout to maintain PWB right LE.   Ambulation/Gait Ambulation/Gait Assistance: 4: Min guard Ambulation Distance (Feet): 125  Feet Assistive device: Rolling walker Ambulation/Gait Assistance Details: Pt with abnormal gait pattern unless constantly cues.  Has decr step length on right LE at times and continued to place too much weight on his toes.  Needs cues for sequencing and technique.  Overall, improving daily.  Pt does not have upright posture with right knee hyperflexed and trunk flexed even though cued to correct this posture.   Gait Pattern: Step-to pattern;Decreased stride length;Decreased step length - right;Trunk flexed Gait velocity: decreased Stairs: No Wheelchair Mobility Wheelchair Mobility: No    Exercises General Exercises - Lower Extremity Ankle Circles/Pumps: AROM;Both;10 reps;Seated Long Arc Quad: AROM;Both;10 reps;Seated Heel Slides: AROM;Both;10 reps;Seated    PT Goals Acute Rehab PT Goals PT Goal: Supine/Side to Sit - Progress: Progressing toward goal PT Goal: Sit to Stand - Progress: Progressing toward goal PT Goal: Ambulate - Progress: Progressing toward goal  Visit Information  Last PT Received On: 06/22/12 Assistance Needed: +1    Subjective Data  Subjective: "My leg hurts."   Cognition  Overall Cognitive Status: Appears within functional limits for tasks assessed/performed Arousal/Alertness: Awake/alert Orientation Level: Oriented X4 / Intact Behavior During Session: [facility] for tasks performed    Balance  Static Sitting Balance Static Sitting - Balance Support: No upper extremity supported;Feet supported Static Sitting - Level of Assistance: 7: Independent Static Sitting - Comment/# of Minutes: Patient sat EOB 3 minutes without assist. Dynamic Standing Balance Dynamic Standing - Balance Support: Bilateral upper extremity supported;During functional activity Dynamic Standing - Level of Assistance: 4: Min assist  End of Session PT - End of Session Equipment Utilized During Treatment: Gait belt ([name]) Activity Tolerance: Patient tolerated treatment well Patient left:  in chair;with call bell/phone within reach;with family/visitor present Nurse Communication: Mobility status;Weight bearing status (pt nauseous but declines meds, request drsg change)        INGOLD,Sian Joles 06/22/2012, 12:19 PM [facility] [phone] (pager)

## 2012-06-22 NOTE — Progress Notes (Addendum)
[facility] Progress Note  06/22/2012 8:00 AM POD 4  Subjective:  Doing well this am  Afebrile VSS 93%RA  Filed Vitals:   06/22/12 0617  BP: 136/62  Pulse:   Temp:   Resp:     Physical Exam: Incisions:  Dr. Edilia Bo checked this am - pt on bedside commode at this time.  Extremities:  Right foot wrapped with Darco shoe  CBC    Component Value Date/Time   WBC 9.9 06/22/2012 0649   RBC 2.82* 06/22/2012 0649   HGB 8.0* 06/22/2012 0649   HCT 25.1* 06/22/2012 0649   PLT 171 06/22/2012 0649   MCV 89.0 06/22/2012 0649   MCH 28.4 06/22/2012 0649   MCHC 31.9 06/22/2012 0649   RDW 14.6 06/22/2012 0649   LYMPHSABS 2.4 06/15/2012 2356   MONOABS 1.0 06/15/2012 2356   EOSABS 0.2 06/15/2012 2356   BASOSABS 0.0 06/15/2012 2356    BMET    Component Value Date/Time   NA 141 06/22/2012 0649   K 4.4 06/22/2012 0649   CL 107 06/22/2012 0649   CO2 20 06/22/2012 0649   GLUCOSE 168* 06/22/2012 0649   BUN 71* 06/22/2012 0649   CREATININE 4.51* 06/22/2012 0649   CREATININE 3.17* 04/21/2012 1037   CALCIUM 8.4 06/22/2012 0649   CALCIUM 9.0 01/21/2008 0000   GFRNONAA 13* 06/22/2012 0649   GFRAA 15* 06/22/2012 0649    INR    Component Value Date/Time   INR 0.98 12/10/2011 0933     Intake/Output Summary (Last 24 hours) at 06/22/12 0800 Last data filed at 06/22/12 0449  Gross per 24 hour  Intake    240 ml  Output   2150 ml  Net  -1910 ml   ABI's 06/21/12:  RIGHT    LEFT     PRESSURE  WAVEFORM   PRESSURE  WAVEFORM   BRACHIAL  Pink band   BRACHIAL  161  T   DP    DP     AT  123  M  AT  79  DM   PT  148  M  PT  85  DM   PER    PER     GREAT TOE   NA  GREAT TOE   NA     RIGHT  LEFT   ABI  0.92  0.53      Assessment/Plan:  57 y.o. male is s/p  1. Right axillofemoral bypass graft with 8 mm interring PTFE graft  2. Right femoral to above-knee popliteal artery bypass graft with 6 mm PTFE graft  3. 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 Edilia Bo  this am -pt doing well and planning for HD access on Thursday -continue ABx -? When to D/c foley-will d/w Dr. Kathalene Frames started at 0.8mg  qday -Hgb 8.0 today was 9.4 yesterday, but 8.1 Sunday- ? False reading yesterday Vs. Blood loss-do not see that pt received transfusion - recheck hgb.   Doreatha Massed, PA-C [facility] [phone] 06/22/2012 8:00 AM   Repeat Hgb reveals 8.9 and stable.  RHYNE, SAMANTHA 06/22/2012 11:28 AM  ABI on the right is 92%. (ABI on the left is 53%).  Vein mapping of left upper extremity on 06/19/2012 shows a reasonable left upper arm cephalic vein and also a reasonable basilic vein on the left. We'll tentatively plan placement of left arm AV fistula on Thursday. Culture from right foot grew moderate Pseudomonas aeruginosa. The patient  is currently on by mouth Cipro which covers this.   Cari Caraway Beeper [phone] 06/22/2012

## 2012-06-22 NOTE — Progress Notes (Signed)
Patient ID: John [name], male   DOB: Jan 15, 1956, 57 y.o.   MRN: [id number]   [facility] Progress Note    Subjective:   Reports to be feeling better today and inquires about disposition. Foley replaced last PM after noted to have urine retention.   Objective:   BP 136/62  Pulse 95  Temp 98.6 F (37 C) (Oral)  Resp 18  Ht 5\' 11"  (1.803 m)  Wt 84.3 kg (185 lb 13.6 oz)  BMI 25.92 kg/m2  SpO2 93%  Intake/Output Summary (Last 24 hours) at 06/22/12 0736 Last data filed at 06/22/12 0449  Gross per 24 hour  Intake  240.9 ml  Output   2150 ml  Net -1909.1 ml   Weight change:   Physical Exam: WUJ:WJXBJYNWGNF eating breakfast, wife at bedside assisting AOZ:HYQMV RRR, S1 and S2 normal Resp:Bilaterally CTA, no rales/rhonchi HQI:ONGE, non tender, BS normal  Ext:Rt LE with 1+ edema  Imaging: No results found.  Labs: BMET  Lab 06/22/12 0649 06/21/12 0700 06/20/12 1737 06/20/12 0700 06/20/12 0004 06/19/12 2035 06/19/12 1700 06/19/12 0506 06/18/12 0630 06/16/12 0858  NA 141 142 143 141 -- 142 -- 144 146* --  K 4.4 4.9 5.0 5.7* 5.7* 6.0* 5.6* -- -- --  CL 107 109 109 109 -- 109 -- 114* 114* --  CO2 20 18* 20 17* -- 20 -- 19 21 --  GLUCOSE 168* 167* 137* 77 -- 93 -- 108* 152* --  BUN 71* 64* 62* 63* -- 58* -- 54* 65* --  CREATININE 4.51* 4.37* 4.57* 4.52* -- 4.40* -- 3.56* 3.65* --  ALB -- -- -- -- -- -- -- -- -- --  CALCIUM 8.4 8.7 8.5 8.4 -- 8.2* -- 8.1* 8.5 --  PHOS 5.8* 6.3* -- -- -- -- -- -- -- 5.1*   CBC  Lab 06/21/12 0700 06/20/12 0700 06/19/12 0506 06/18/12 0630 06/15/12 2356  WBC 13.4* 11.6* 12.4* 9.1 --  NEUTROABS -- -- -- -- 7.4  HGB 9.4* 8.1* 9.3* 9.9* --  HCT 29.6* 26.5* 29.2* 30.9* --  MCV 89.7 90.1 89.6 88.8 --  PLT 147* 143* 178 199 --    Medications:      . amLODipine  5 mg Oral Daily  . aspirin  81 mg Oral Daily  . baclofen  10 mg Oral BID  . ciprofloxacin  500 mg Oral Q breakfast  . darbepoetin (ARANESP) injection - DIALYSIS  100  mcg Subcutaneous Q Mon-HD  . docusate sodium  100 mg Oral BID  . heparin  5,000 Units Subcutaneous Q8H  . insulin aspart  0-15 Units Subcutaneous TID WC  . insulin aspart  0-5 Units Subcutaneous QHS  . insulin glargine  20 Units Subcutaneous Daily  . metoprolol succinate  25 mg Oral Daily  . nicotine  7 mg Transdermal Daily  . nortriptyline  10 mg Oral QHS  . pantoprazole  40 mg Oral Daily  . simvastatin  20 mg Oral q1800  . sodium bicarbonate  650 mg Oral BID  . Tamsulosin HCl  0.8 mg Oral Daily     Assessment/ Plan:   1. Chronic kidney disease stage IV: Mild ARF on CKD4 postoperatively with transient hyperkalemia that has been medically managed. No acute indications for hemodialysis at this time, no uremic symptoms present. Planned placement of permanent dialysis access later this week. Bladder training after foley removal will be needed +/- flomax addition.  2. Right foot ischemia: Status post right axillofemoral and right femoral-popliteal bypass graft  with amputation right fourth and fifth toes.  3. Anemia: Appears to be anemia of chronic kidney disease compounded by postoperative loss. On Aranesp-anticipated dose today.  4. Diabetes mellitus: On insulin, anticipate some postoperative difficulties in control secondary to "stress".  5. Metabolic acidosis: Secondary to progressive CK D. versus renal tubular acidosis of diabetes-started on oral sodium bicarbonate with good improvement of bicarbonate level.  Will follow labs- please call if we may be of further assistance  Zetta Bills, MD 06/22/2012, 7:36 AM

## 2012-06-22 NOTE — Progress Notes (Signed)
OT  Note  Patient Details Name: John [name] MRN: [id number] DOB: [birth year]   Cancelled Treatment:    Reason Eval/Treat Not Completed: Pt to D/C to SNF for rehab. Will defer OT to SNF. If eval needed for admission, please reorder. THanks    [facility] Deneene Tarver, OTR/L  [phone] 06/22/2012 06/22/2012, 1:19 PM

## 2012-06-22 NOTE — Clinical Social Work Psychosocial (Signed)
Clinical Social Work Department BRIEF PSYCHOSOCIAL ASSESSMENT 06/22/2012  Patient:  John [name], John [name]     Account Number:  [id number]     Admit date:  06/15/2012  Clinical Social Worker:  Thomasene Mohair  Date/Time:  06/22/2012 02:00 PM  Referred by:  Care Management  Date Referred:  06/22/2012 Referred for  SNF Placement   Other Referral:   Interview type:  Patient Other interview type:   wife and step-daughter at bedside    PSYCHOSOCIAL DATA Living Status:  FAMILY Admitted from facility:   Level of care:   Primary support name:  Farron Watrous Primary support relationship to patient:  SPOUSE Degree of support available:   strong    CURRENT CONCERNS Current Concerns  Post-Acute Placement   Other Concerns:    SOCIAL WORK ASSESSMENT / PLAN CSW was referred to Pt to assist with dc planning. Pt lives at home with his wife and was independent at baseline. Pt's wife is supportive of Pt and very involved in his care. CSW, Pt and family discussed benefits of snf vs home health. Pt and family are agreeable to snf if recommended. They are familiar with facilities and have narrowed choice down to 2. CSW will continue to f/u as Pt is closer to dc.   Assessment/plan status:  Psychosocial Support/Ongoing Assessment of Needs Other assessment/ plan:   Information/referral to community resources:    PATIENT'S/FAMILY'S RESPONSE TO PLAN OF CARE: Pt is calm and engaged in dc planning process. Pt and his family will continue to discuss SNF vs HH at dc. CSW provided Pt with bed offers and family selected facility if MD recommends SNF at dc.    Frederico Hamman, LCSW [phone]

## 2012-06-23 LAB — RENAL FUNCTION PANEL
Albumin: 2.4 g/dL — ABNORMAL LOW (ref 3.5–5.2)
GFR calc Af Amer: 15 mL/min — ABNORMAL LOW (ref >90)
GFR calc non Af Amer: 13 mL/min — ABNORMAL LOW (ref >90)
Phosphorus: 5.9 mg/dL — ABNORMAL HIGH (ref 2.3–4.6)
Potassium: 4.4 mEq/L (ref 3.5–5.1)
Sodium: 142 mEq/L (ref 135–145)

## 2012-06-23 LAB — CBC
Hemoglobin: 8.1 g/dL — ABNORMAL LOW (ref 13.0–17.0)
MCHC: 31.9 g/dL (ref 30.0–36.0)
Platelets: 178 10*3/uL (ref 150–400)
RDW: 14.4 % (ref 11.5–15.5)

## 2012-06-23 NOTE — Progress Notes (Signed)
I discussed with  Dr Bradshaw.  I agree with their plans documented in their progress note for today.  

## 2012-06-23 NOTE — Progress Notes (Signed)
VASCULAR PROGRESS NOTE  SUBJECTIVE: no complaints. He has been working with physical therapy.  PHYSICAL EXAM: Filed Vitals:   06/22/12 0617 06/22/12 1300 06/22/12 1955 06/23/12 0505  BP: 136/62 132/74 126/68 133/68  Pulse:  98 93 98  Temp:   98.8 F (37.1 C) 98.7 F (37.1 C)  TempSrc:  Oral Oral Oral  Resp:   18 20  Height:      Weight:    204 lb 2.3 oz (92.6 kg)  SpO2:  97% 100% 99%   All 3 incisions look fine. Toe amputation sites inspected. There is some dried tissue and the dressing changes need to be done with hydrogel. There is some granulation tissue and the tissue appears adequately perfused.  LABS: Lab Results  Component Value Date   WBC 9.9 06/22/2012   HGB 8.9* 06/22/2012   HCT 28.4* 06/22/2012   MCV 89.0 06/22/2012   PLT 171 06/22/2012   Lab Results  Component Value Date   CREATININE 4.51* 06/22/2012   Lab Results  Component Value Date   INR 0.98 12/10/2011   CBG (last 3)   Basename 06/22/12 2057 06/22/12 1607 06/22/12 1138  GLUCAP 76 143* 113*     ASSESSMENT/PLAN: 1. 5 Days Post-Op s/p: right axillofemoral bypass graft and right femoral to popliteal artery bypass graft with Ray amputation of the right fourth and fifth toes. 2. For left AV fistula tomorrow. 3. He could potentially go to a skilled nursing facility after surgery tomorrow if a bed is available. 4. Continue po Cipro for 1 more week given that he has a prosthetic graft. 5. I believe the plan is for him to follow up with urology concerning his Foley.  Cari Caraway Beeper: [phone] 06/23/2012

## 2012-06-23 NOTE — Progress Notes (Signed)
Family Medicine Teaching Service Daily Progress Note Service Page: [phone]  Pt assessment:  57 y.o. 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   Subjective:  Pain controlled, no dyspnea or chest pain. Some soreness on L great toe. Tolerating PO well. No more confusion.    Objective: Temp:  [98.7 F (37.1 C)-98.8 F (37.1 C)] 98.7 F (37.1 C) (01/15 0505) Pulse Rate:  [93-98] 98  (01/15 0505) Resp:  [18-20] 20  (01/15 0505) BP: (126-133)/(68-74) 133/68 mmHg (01/15 0505) SpO2:  [97 %-100 %] 99 % (01/15 0505) Weight:  [204 lb 2.3 oz (92.6 kg)] 204 lb 2.3 oz (92.6 kg) (01/15 0505)   Intake/Output Summary (Last 24 hours) at 06/23/12 0826 Last data filed at 06/23/12 0700  Gross per 24 hour  Intake   1080 ml  Output   1000 ml  Net     80 ml   Physical Exam: General: resting comfortably in bed. NAD, Clean dry, intact incision on  R leg and R chest Cardiovascular: RRR. No murmurs, rubs, or gallops. Respiratory: CTAB. No rales, rhonchi, or wheezing. Abdomen: soft, nontender, no guarding Extremities: R foot with clean, dry intact dressing. Warm R leg, mild tenderness to palpation of slightly hyperpigmented 2 cm round area on Medial portion of foot just proximal to great toe.   Neuro: No focal deficits, alert and oriented  CBC BMET   Lab 06/23/12 0515 06/22/12 0856 06/22/12 0649 06/21/12 0700  WBC 9.5 -- 9.9 13.4*  HGB 8.1* 8.9* 8.0* --  HCT 25.4* 28.4* 25.1* --  PLT 178 -- 171 147*    Lab 06/23/12 0515 06/22/12 0649 06/21/12 0700  NA 142 141 142  K 4.4 4.4 4.9  CL 108 107 109  CO2 23 20 18*  BUN 73* 71* 64*  CREATININE 4.50* 4.51* 4.37*  GLUCOSE 105* 168* 167*  CALCIUM 8.4 8.4 8.7     Imaging/Diagnostic Tests:  Lower extremity Arterial Duplex Summary: Probableaorto-iliac disease, with severe iliac  disease on right and moderately severe on left. The right common femoral artery is patent with monophasic flow. The left common femoral artery appears to be stenotic with elevated velocities. The right femoral artery appears to be occluded in its mid segment with multi-collateral flow supplying the distal femoral artery and popliteal artery. The left femoral artery appears to be occluded just past its origin with multi-collateral flow supplying the distal femoral artery and popliteal artery. There is three vessel runoff bilaterally. There is possible moderate disease of the right anterior tibial artery and proximal left peroneal artery. The right external iliac artery demonstrates severely dampened monophasic flow. The left external iliac artery exhibits monophasic flow. Unable to visualize bilateral common iliac arteries and aorta.  ABI: Unable to insonate bilateral dorsalis pedis arteries, as well as the right posterior tibial artery. The left ABI=0.34, which is indicative of severe arterial insufficiency. Unable to calculate right ABI.  US Renal 06/16/2012 IMPRESSION:  1. Negative for hydronephrosis or acute abnormality.  2. Increased cortical echogenicity compatible with medical renal  disease.  3. Bilateral renal cysts as described  Dg Toe 4th Right 06/15/2012 IMPRESSION:  Diffuse soft tissue swelling. No plain film findings of  Osteomyelitis.  MR/MAR Abdomen, pelvis, BL LE without 06/17/2012 IMPRESSION:  1. High-grade stenosis or a short segment occlusion at the aortic  bifurcation  extending a short segment into bilateral proximal  common iliac arteries.  2. Bilateral SFA occlusions with popliteal reconstitution.  3. Contiguous left anterior tibial runoff to the foot.  4. Diseased right posterior tibial and peroneal runoff across the  right ankle.  Assessment/Plan: John [name] is a 57 y.o. year old male presenting with ischemic necrosis of his 4th and 5th digits of  the right foot and acute on chronic kidney injury. Now S/p bypass and amuputation post op day 5  Ischemic Necrosis of Toes  - Vascular surgery on board- appreciate reccs and treatment  - A/V fistula per renal reccs on thursday, vascular mapping complete - Amputation and revascularization by VVS on 06/18/2012  R Ax-fem bypass, R Fem-pop bypass, Amputation of 4th and 5th digits.  - Wound culture grew pan-sensitive pseudomonas - Initially started on IV zosyn post op, transitioned now to Cipro   - total duration of Tx- 6  - one more week considering artificial graft, appreciate VVS' help.  - Dressing clean, dry intact - Pain controlled well on current regimen - PRN oxycodone   Acute on chronic kidney disease - improving - Patient has CKD stage 4 (baseline Creatinine 3.8) and had acute rise in creatinine to 5.18 on admission. - Renal following - appreciate reccs  - Patient will need placement of AVF per renal reccs- VVS planning placement on friday  - Sodium bicarb for metabolic acidosis 2/2 progressive CKD vs RTA of diabetes - Creatinine steady around 4.5  - UOP 2 L on 1/11, Negative 5.4 L for admission.  - will continue to monitor closely   Metabolic acidosis- Resolving with current Tx - 2/2 progressive CKD vs RTA of diabetes - renal on board apppreciate reccs - sodium bicarb per renal  Hyperkalemia - resolved currently - 4.4 this am,  - Kayexalate as needed- last given 30 G X 2 on 1/12 - follow with daily BMP  Anemia- stable - Hgb 12.0 on admission, down to 8.1 today (from 8.9 on 1/14) - likely anemia of renal disease and expected post-op anemia - starting arinesp today  Prostatic Hyperplasia - Flomax daily, bladder scan showed approx 1L post void X2 - Coude catheter placed 1/13  - urology f/u OP - avoid anticholinergics- namely benedryl for sleep (PRN trazadone)  DM  - SSI and Lantus 20 Units  - Good control at this time -  CBG's 76-126  HTN  - controlled 126-136/68 -  Cont amlodipine 10 mg, metoprolol succinate 50 mg  Smoking cessation - has cut down to 1/2 ppd and wants to quit.  - nicotine patch helping - f/u OP with our smoking cessation specialist pharmacist  FENGI: carb modified diet PPX: Heparin sub Q  DISPO: Patient to undergo revascularization and amputation as well as placement of AVF. CODE STATUS: full code  Kevin Fenton, MD 06/23/2012, 8:26 AM

## 2012-06-24 ENCOUNTER — Encounter (HOSPITAL_COMMUNITY)
Admission: EM | Disposition: A | Discharge status: Skilled Nursing Facility | Payer: Self-pay | Source: Home / Self Care | Attending: Family Medicine

## 2012-06-24 ENCOUNTER — Encounter (HOSPITAL_COMMUNITY): Payer: Self-pay | Admitting: Certified Registered"

## 2012-06-24 ENCOUNTER — Inpatient Hospital Stay (HOSPITAL_COMMUNITY): Payer: Medicare Other | Admitting: Certified Registered"

## 2012-06-24 DIAGNOSIS — I70269 Atherosclerosis of native arteries of extremities with gangrene, unspecified extremity: Secondary | ICD-10-CM

## 2012-06-24 DIAGNOSIS — N186 End stage renal disease: Secondary | ICD-10-CM

## 2012-06-24 HISTORY — PX: AV FISTULA PLACEMENT: SHX1204

## 2012-06-24 LAB — RENAL FUNCTION PANEL
BUN: 76 mg/dL — ABNORMAL HIGH (ref 6–23)
CO2: 18 mEq/L — ABNORMAL LOW (ref 19–32)
Calcium: 8.7 mg/dL (ref 8.4–10.5)
Creatinine, Ser: 4.31 mg/dL — ABNORMAL HIGH (ref 0.50–1.35)
Glucose, Bld: 103 mg/dL — ABNORMAL HIGH (ref 70–99)
Phosphorus: 5.7 mg/dL — ABNORMAL HIGH (ref 2.3–4.6)

## 2012-06-24 LAB — CBC
Hemoglobin: 8.9 g/dL — ABNORMAL LOW (ref 13.0–17.0)
MCH: 28.3 pg (ref 26.0–34.0)
MCHC: 31.8 g/dL (ref 30.0–36.0)
MCV: 89.2 fL (ref 78.0–100.0)
RBC: 3.14 MIL/uL — ABNORMAL LOW (ref 4.22–5.81)

## 2012-06-24 LAB — GLUCOSE, CAPILLARY: Glucose-Capillary: 79 mg/dL (ref 70–99)

## 2012-06-24 SURGERY — ARTERIOVENOUS (AV) FISTULA CREATION
Anesthesia: Monitor Anesthesia Care | Site: Arm Lower | Laterality: Left | Wound class: Clean

## 2012-06-24 MED ORDER — LIDOCAINE HCL (PF) 1 % IJ SOLN
INTRAMUSCULAR | Status: AC
  Filled 2012-06-24: qty 30

## 2012-06-24 MED ORDER — OXYCODONE HCL 5 MG/5ML PO SOLN
5.0000 mg | Freq: Once | ORAL | Status: DC | PRN
Start: 2012-06-24 — End: 2012-06-24

## 2012-06-24 MED ORDER — LIDOCAINE-EPINEPHRINE (PF) 1 %-1:200000 IJ SOLN
INTRAMUSCULAR | Status: AC
  Filled 2012-06-24: qty 10

## 2012-06-24 MED ORDER — CIPROFLOXACIN HCL 500 MG PO TABS: 500.0000 mg | ORAL_TABLET | Freq: Every day | ORAL | Status: DC

## 2012-06-24 MED ORDER — MEPERIDINE HCL 25 MG/ML IJ SOLN: 6.2500 mg | INTRAMUSCULAR | Status: DC | PRN

## 2012-06-24 MED ORDER — FENTANYL CITRATE 0.05 MG/ML IJ SOLN
INTRAMUSCULAR | Status: DC | PRN
  Administered 2012-06-24: 50 ug via INTRAVENOUS

## 2012-06-24 MED ORDER — OXYCODONE HCL 5 MG PO TABS: 5.0000 mg | ORAL_TABLET | Freq: Once | ORAL | Status: DC | PRN

## 2012-06-24 MED ORDER — OXYCODONE-ACETAMINOPHEN 7.5-325 MG PO TABS: 1.0000 | ORAL_TABLET | Freq: Four times a day (QID) | ORAL | Status: DC | PRN

## 2012-06-24 MED ORDER — MIDAZOLAM HCL 5 MG/5ML IJ SOLN
INTRAMUSCULAR | Status: DC | PRN
  Administered 2012-06-24: 2 mg via INTRAVENOUS

## 2012-06-24 MED ORDER — 0.9 % SODIUM CHLORIDE (POUR BTL) OPTIME
TOPICAL | Status: DC | PRN
  Administered 2012-06-24: 1000 mL

## 2012-06-24 MED ORDER — DARBEPOETIN ALFA-POLYSORBATE 100 MCG/0.5ML IJ SOLN: 100.0000 ug | INTRAMUSCULAR | Status: DC

## 2012-06-24 MED ORDER — SODIUM CHLORIDE 0.9 % IV SOLN
INTRAVENOUS | Status: DC | PRN
  Administered 2012-06-24: 10:00:00 via INTRAVENOUS

## 2012-06-24 MED ORDER — INSULIN GLARGINE 100 UNIT/ML ~~LOC~~ SOLN: 20.0000 [IU] | Freq: Every day | SUBCUTANEOUS | Status: DC

## 2012-06-24 MED ORDER — NICOTINE 7 MG/24HR TD PT24: 1.0000 | MEDICATED_PATCH | Freq: Every day | TRANSDERMAL | Status: DC

## 2012-06-24 MED ORDER — LIDOCAINE HCL (PF) 1 % IJ SOLN
INTRAMUSCULAR | Status: DC | PRN
  Administered 2012-06-24: 10 mL

## 2012-06-24 MED ORDER — TAMSULOSIN HCL 0.4 MG PO CAPS: 0.8000 mg | ORAL_CAPSULE | Freq: Every day | ORAL | Status: DC

## 2012-06-24 MED ORDER — SODIUM POLYSTYRENE SULFONATE 15 GM/60ML PO SUSP
30.0000 g | Freq: Once | ORAL | Status: AC
  Administered 2012-06-24: 30 g via ORAL
  Filled 2012-06-24: qty 120

## 2012-06-24 MED ORDER — SODIUM CHLORIDE 0.9 % IR SOLN
Status: DC | PRN
  Administered 2012-06-24: 10:00:00

## 2012-06-24 MED ORDER — THROMBIN 20000 UNITS EX SOLR
CUTANEOUS | Status: AC
  Filled 2012-06-24: qty 20000

## 2012-06-24 MED ORDER — OXYCODONE HCL 5 MG PO TABS: 5.0000 mg | ORAL_TABLET | ORAL | Status: DC | PRN

## 2012-06-24 MED ORDER — HYDROMORPHONE HCL PF 1 MG/ML IJ SOLN: 0.2500 mg | INTRAMUSCULAR | Status: DC | PRN

## 2012-06-24 MED ORDER — TRAZODONE HCL 50 MG PO TABS: 50.0000 mg | ORAL_TABLET | Freq: Every evening | ORAL | Status: DC | PRN

## 2012-06-24 MED ORDER — PROMETHAZINE HCL 25 MG/ML IJ SOLN: 6.2500 mg | INTRAMUSCULAR | Status: DC | PRN

## 2012-06-24 MED ORDER — PROTAMINE SULFATE 10 MG/ML IV SOLN
INTRAVENOUS | Status: DC | PRN
  Administered 2012-06-24 (×2): 10 mg via INTRAVENOUS
  Administered 2012-06-24: 20 mg via INTRAVENOUS

## 2012-06-24 MED ORDER — ONDANSETRON HCL 4 MG/2ML IJ SOLN
INTRAMUSCULAR | Status: DC | PRN
  Administered 2012-06-24: 4 mg via INTRAVENOUS

## 2012-06-24 MED ORDER — PROPOFOL INFUSION 10 MG/ML OPTIME
INTRAVENOUS | Status: DC | PRN
  Administered 2012-06-24: 100 ug/kg/min via INTRAVENOUS

## 2012-06-24 MED ORDER — OXYCODONE-ACETAMINOPHEN 5-325 MG PO TABS: 1.0000 | ORAL_TABLET | ORAL | Status: DC | PRN

## 2012-06-24 MED ORDER — HEPARIN SODIUM (PORCINE) 1000 UNIT/ML IJ SOLN
INTRAMUSCULAR | Status: DC | PRN
  Administered 2012-06-24: 7000 [IU] via INTRAVENOUS

## 2012-06-24 MED ORDER — SODIUM BICARBONATE 650 MG PO TABS: 650.0000 mg | ORAL_TABLET | Freq: Two times a day (BID) | ORAL | Status: DC

## 2012-06-24 SURGICAL SUPPLY — 44 items
ADH SKN CLS APL DERMABOND .7 (GAUZE/BANDAGES/DRESSINGS) ×1
BANDAGE ELASTIC 4 VELCRO ST LF (GAUZE/BANDAGES/DRESSINGS) ×1 IMPLANT
BANDAGE GAUZE ELAST BULKY 4 IN (GAUZE/BANDAGES/DRESSINGS) ×1 IMPLANT
BLADE SURG CLIPPER 3M 9600 (MISCELLANEOUS) ×1 IMPLANT
CANISTER SUCTION 2500CC (MISCELLANEOUS) ×2 IMPLANT
CLIP TI MEDIUM 6 (CLIP) ×2 IMPLANT
CLIP TI WIDE RED SMALL 6 (CLIP) ×2 IMPLANT
CLOTH BEACON ORANGE TIMEOUT ST (SAFETY) ×2 IMPLANT
COVER PROBE W GEL 5X96 (DRAPES) ×2 IMPLANT
COVER SURGICAL LIGHT HANDLE (MISCELLANEOUS) ×2 IMPLANT
DECANTER SPIKE VIAL GLASS SM (MISCELLANEOUS) ×2 IMPLANT
DERMABOND ADVANCED (GAUZE/BANDAGES/DRESSINGS) ×1
DERMABOND ADVANCED .7 DNX12 (GAUZE/BANDAGES/DRESSINGS) ×1 IMPLANT
DRAIN PENROSE 1/2X12 LTX STRL (WOUND CARE) IMPLANT
ELECT REM PT RETURN 9FT ADLT (ELECTROSURGICAL) ×2
ELECTRODE REM PT RTRN 9FT ADLT (ELECTROSURGICAL) ×1 IMPLANT
GLOVE BIO SURGEON STRL SZ 6.5 (GLOVE) ×1 IMPLANT
GLOVE BIO SURGEON STRL SZ7.5 (GLOVE) ×2 IMPLANT
GLOVE BIOGEL PI IND STRL 6.5 (GLOVE) IMPLANT
GLOVE BIOGEL PI IND STRL 7.0 (GLOVE) IMPLANT
GLOVE BIOGEL PI IND STRL 7.5 (GLOVE) IMPLANT
GLOVE BIOGEL PI IND STRL 8 (GLOVE) ×1 IMPLANT
GLOVE BIOGEL PI INDICATOR 6.5 (GLOVE) ×2
GLOVE BIOGEL PI INDICATOR 7.0 (GLOVE) ×1
GLOVE BIOGEL PI INDICATOR 7.5 (GLOVE) ×2
GLOVE BIOGEL PI INDICATOR 8 (GLOVE) ×1
GOWN PREVENTION PLUS XLARGE (GOWN DISPOSABLE) ×1 IMPLANT
GOWN STRL NON-REIN LRG LVL3 (GOWN DISPOSABLE) ×4 IMPLANT
KIT BASIN OR (CUSTOM PROCEDURE TRAY) ×2 IMPLANT
KIT ROOM TURNOVER OR (KITS) ×2 IMPLANT
NS IRRIG 1000ML POUR BTL (IV SOLUTION) ×2 IMPLANT
PACK CV ACCESS (CUSTOM PROCEDURE TRAY) ×2 IMPLANT
PAD ARMBOARD 7.5X6 YLW CONV (MISCELLANEOUS) ×4 IMPLANT
SPONGE GAUZE 4X4 12PLY (GAUZE/BANDAGES/DRESSINGS) ×2 IMPLANT
SPONGE SURGIFOAM ABS GEL 100 (HEMOSTASIS) IMPLANT
SUT PROLENE 6 0 BV (SUTURE) ×2 IMPLANT
SUT VIC AB 3-0 SH 27 (SUTURE) ×2
SUT VIC AB 3-0 SH 27X BRD (SUTURE) ×1 IMPLANT
SUT VICRYL 4-0 PS2 18IN ABS (SUTURE) ×2 IMPLANT
TOWEL NATURAL 6PK STERILE (DISPOSABLE) ×1 IMPLANT
TOWEL OR 17X24 6PK STRL BLUE (TOWEL DISPOSABLE) ×3 IMPLANT
TOWEL OR 17X26 10 PK STRL BLUE (TOWEL DISPOSABLE) ×2 IMPLANT
UNDERPAD 30X30 INCONTINENT (UNDERPADS AND DIAPERS) ×2 IMPLANT
WATER STERILE IRR 1000ML POUR (IV SOLUTION) ×2 IMPLANT

## 2012-06-24 NOTE — Progress Notes (Signed)
PT Cancellation Note  Patient Details Name: John [name] MRN: [id number] DOB: [birth year]   Cancelled Treatment:    Reason Eval/Treat Not Completed: Medical issues which prohibited therapy (Pt getting ready to go for AV fistula).  Will return in am.  Thanks.  [facility] [phone] (pager)     INGOLD,Nasya Vincent 06/24/2012, 9:33 AM

## 2012-06-24 NOTE — Clinical Social Work Placement (Signed)
Clinical Social Work Department CLINICAL SOCIAL WORK PLACEMENT NOTE 06/24/2012  Patient:  John [name], John [name]  Account Number:  [id number] Admit date:  06/15/2012  Clinical Social Worker:  Frederico Hamman, LCSW  Date/time:  06/24/2012 12:00 M  Clinical Social Work is seeking post-discharge placement for this patient at the following level of care:   SKILLED NURSING   (*CSW will update this form in Epic as items are completed)   06/21/2012  Patient/family provided with [facility] Department of Clinical Social Work's list of facilities offering this level of care within the geographic area requested by the patient (or if unable, by the patient's family).  06/21/2012  Patient/family informed of their freedom to choose among providers that offer the needed level of care, that participate in Medicare, Medicaid or managed care program needed by the patient, have an available bed and are willing to accept the patient.  06/21/2012  Patient/family informed of [facility]' ownership interest in [facility], as well as of the fact that they are under no obligation to receive care at this facility.  PASARR submitted to EDS on 06/24/2012 PASARR number received from [employer] on   FL2 transmitted to all facilities in geographic area requested by pt/family on  06/21/2012 FL2 transmitted to all facilities within larger geographic area on   Patient informed that his/her managed care company has contracts with or will negotiate with  certain facilities, including the following:     Patient/family informed of bed offers received:  06/21/2012 Patient chooses bed at [facility] Physician recommends and patient chooses bed at    Patient to be transferred to [facility] on  06/24/2012 Patient to be transferred to facility by family  The following physician request were entered in Epic:   Additional Comments:   Frederico Hamman, LCSW [phone]

## 2012-06-24 NOTE — Anesthesia Postprocedure Evaluation (Signed)
  Anesthesia Post-op Note  Patient: John [name]  Procedure(s) Performed: Procedure(s) (LRB) with comments: ARTERIOVENOUS (AV) FISTULA CREATION (Left)  Patient Location: PACU  Anesthesia Type:MAC  Level of Consciousness: awake  Airway and Oxygen Therapy: Patient Spontanous Breathing  Post-op Pain: mild  Post-op Assessment: Post-op Vital signs reviewed  Post-op Vital Signs: stable  Complications: No apparent anesthesia complications

## 2012-06-24 NOTE — Care Management Note (Signed)
    Page 1 of 1   06/24/2012     4:05:07 PM   CARE MANAGEMENT NOTE 06/24/2012  Patient:  John [name], John [name]   Account Number:  [id number]  Date Initiated:  06/22/2012  Documentation initiated by:  Milica Gully  Subjective/Objective Assessment:   PT S/P RT FEM POP BYPASS GRAFT AND RT 4-5 TOE AMPUATION ON 06/18/12.  PTA, PT RESIDED AT HOME WITH SPOUSE.     Action/Plan:   MET WITH PT'S WIFE TO DISCUSS DC PLANS.  WIFE CONCERNED ABOUT TAKING PT HOME, AS SHE HAS A "BAD BACK."  SHE THINKS HE WILL NEED SNF FOR REHAB.   Anticipated DC Date:  06/24/2012   Anticipated DC Plan:  SKILLED NURSING FACILITY  In-house referral  Clinical Social Worker      DC Planning Services  CM consult      Choice offered to / List presented to:             Status of service:  Completed, signed off Medicare Important Message given?   (If response is "NO", the following Medicare IM given date fields will be blank) Date Medicare IM given:   Date Additional Medicare IM given:    Discharge Disposition:  SKILLED NURSING FACILITY  Per UR Regulation:  Reviewed for med. necessity/level of care/duration of stay  If discussed at Long Length of Stay Meetings, dates discussed:    Comments:  06/24/12 Rosaland Shiffman,RN,BSN [phone] PT DISCHARGING TO SNF TODAY, PER CSW ARRANGEMENTS.  06/21/12 J. Bayli Quesinberry,RN,BSN [phone] WILL CONSULT CSW TO FACILITATE POSSIBLE DC TO SNF WHEN MEDICALLY STABLE FOR DC.  WILL FOLLOW PROGRESS.

## 2012-06-24 NOTE — Progress Notes (Signed)
VASCULAR SURGERY:  The patient is were a left AV fistula today. I will follow the right open toe amputation site as an outpatient. From a vascular standpoint he could be discharged today after his AV fistula is placed and I will arrange follow up in my office. He will need to continue his Cipro for another week and will need continued dressing changes with hydrogel.  Cari Caraway Beeper [phone] 06/24/2012

## 2012-06-24 NOTE — Discharge Summary (Signed)
I discussed with  Dr Bradshaw.  I agree with their plans documented in their discharge  note for today.   

## 2012-06-24 NOTE — Progress Notes (Signed)
I discussed with  Dr Bradshaw.  I agree with their plans documented in their progress note for today.  

## 2012-06-24 NOTE — Anesthesia Preprocedure Evaluation (Addendum)
Anesthesia Evaluation  Patient identified by MRN, date of birth, ID band Patient awake    Reviewed: Allergy & Precautions, H&P , NPO status , Patient's Chart, lab work & pertinent test results, reviewed documented beta blocker date and time   History of Anesthesia Complications Negative for: history of anesthetic complications  Airway Mallampati: II TM Distance: >3 FB Neck ROM: Full    Dental  (+) Edentulous Upper, Poor Dentition and Dental Advisory Given   Pulmonary asthma , COPD COPD inhaler, Current Smoker,  breath sounds clear to auscultation  Pulmonary exam normal       Cardiovascular hypertension, Pt. on medications + Peripheral Vascular Disease Rhythm:Regular Rate:Normal  '07 ECHO: low normal LVF, mildly calcified aortic valve   Neuro/Psych CVA (s/p CEA)    GI/Hepatic negative GI ROS, Neg liver ROS,   Endo/Other  diabetes (glu 129), Well Controlled, Type 2, Insulin Dependent  Renal/GU CRFRenal disease     Musculoskeletal   Abdominal   Peds  Hematology   Anesthesia Other Findings   Reproductive/Obstetrics                          Anesthesia Physical Anesthesia Plan  ASA: III  Anesthesia Plan: MAC   Post-op Pain Management:    Induction:   Airway Management Planned:   Additional Equipment:   Intra-op Plan:   Post-operative Plan:   Informed Consent: I have reviewed the patients History and Physical, chart, labs and discussed the procedure including the risks, benefits and alternatives for the proposed anesthesia with the patient or authorized representative who has indicated his/her understanding and acceptance.   Dental advisory given  Plan Discussed with:   Anesthesia Plan Comments:        Anesthesia Quick Evaluation

## 2012-06-24 NOTE — Progress Notes (Signed)
Intra-op Progress Note: right 5th toe amp site clean and dry. No erythema or drainage.  Continue hydrogel and dressing changes as ordered

## 2012-06-24 NOTE — Progress Notes (Signed)
Assessment unchanged. Discussed D/C instructions with pt and wife. Verbalized understanding. RX and packet given to wife to give to SNF. IV and tele removed. PT left via W/C accompanied by NT to go to SNF.

## 2012-06-24 NOTE — Anesthesia Procedure Notes (Signed)
Procedure Name: MAC Date/Time: 06/24/2012 9:38 AM Performed by: Jerilee Hoh Pre-anesthesia Checklist: Patient identified, Emergency Drugs available, Suction available and Patient being monitored Patient Re-evaluated:Patient Re-evaluated prior to inductionOxygen Delivery Method: Simple face mask Intubation Type: IV induction Placement Confirmation: positive ETCO2 and breath sounds checked- equal and bilateral

## 2012-06-24 NOTE — Transfer of Care (Signed)
Immediate Anesthesia Transfer of Care Note  Patient: John [name]  Procedure(s) Performed: Procedure(s) (LRB) with comments: ARTERIOVENOUS (AV) FISTULA CREATION (Left)  Patient Location: PACU  Anesthesia Type:MAC  Level of Consciousness: awake, alert , oriented and patient cooperative  Airway & Oxygen Therapy: Patient Spontanous Breathing and Patient connected to face mask oxygen  Post-op Assessment: Report given to PACU RN, Post -op Vital signs reviewed and stable and Patient moving all extremities  Post vital signs: Reviewed and stable  Complications: No apparent anesthesia complications

## 2012-06-24 NOTE — H&P (View-Only) (Signed)
VASCULAR PROGRESS NOTE  SUBJECTIVE: no complaints. He has been working with physical therapy.  PHYSICAL EXAM: Filed Vitals:   06/22/12 0617 06/22/12 1300 06/22/12 1955 06/23/12 0505  BP: 136/62 132/74 126/68 133/68  Pulse:  98 93 98  Temp:   98.8 F (37.1 C) 98.7 F (37.1 C)  TempSrc:  Oral Oral Oral  Resp:   18 20  Height:      Weight:    204 lb 2.3 oz (92.6 kg)  SpO2:  97% 100% 99%   All 3 incisions look fine. Toe amputation sites inspected. There is some dried tissue and the dressing changes need to be done with hydrogel. There is some granulation tissue and the tissue appears adequately perfused.  LABS: Lab Results  Component Value Date   WBC 9.9 06/22/2012   HGB 8.9* 06/22/2012   HCT 28.4* 06/22/2012   MCV 89.0 06/22/2012   PLT 171 06/22/2012   Lab Results  Component Value Date   CREATININE 4.51* 06/22/2012   Lab Results  Component Value Date   INR 0.98 12/10/2011   CBG (last 3)   Basename 06/22/12 2057 06/22/12 1607 06/22/12 1138  GLUCAP 76 143* 113*     ASSESSMENT/PLAN: 1. 5 Days Post-Op s/p: right axillofemoral bypass graft and right femoral to popliteal artery bypass graft with Ray amputation of the right fourth and fifth toes. 2. For left AV fistula tomorrow. 3. He could potentially go to a skilled nursing facility after surgery tomorrow if a bed is available. 4. Continue po Cipro for 1 more week given that he has a prosthetic graft. 5. I believe the plan is for him to follow up with urology concerning his Foley.  Chris Ilene Witcher Beeper: [phone] 06/23/2012    

## 2012-06-24 NOTE — Op Note (Signed)
NAME: John [name]   MRN: [id number] DOB: 03/10/1956    DATE OF OPERATION: 06/24/2012  PREOP DIAGNOSIS: chronic kidney disease  POSTOP DIAGNOSIS: same  PROCEDURE: left brachiocephalic AV fistula  SURGEON: Di Kindle. Edilia Bo, MD, FACS  ASSIST: Della Goo PA  ANESTHESIA: local with sedation   EBL: minimal  INDICATIONS: John [name] is a 57 y.o. male who needs hemodialysis access. He appears to have a reasonable left upper arm cephalic vein. He has a right axillofemoral bypass graft.  FINDINGS: 4 mm vein.  TECHNIQUE: The patient was brought to the operating room and sedated by anesthesia. The left upper extremity was prepped and draped in the usual sterile fashion. After the skin was infiltrated with 1% lidocaine, a transverse incision was made just above the antecubital level. Cephalic vein was dissected free with branches divided between clips and 3-0 silk ties. The vein was ligated distally and irrigated with heparinized saline. It turned out to be a very reasonable size vein. The brachial artery was dissected free beneath the fascia. The patient was heparinized. The brachial artery was clamped proximally and distally and a longitudinal arteriotomy was made. The vein was sewn end-to-side to the artery using continuous 6-0 Prolene suture. At the completion there was an excellent thrill in the fistula. Hemostasis was obtained in the wound and the wound is closed with a deep layer of 3-0 Vicryl and the skin closed with 4-0 Vicryl. Dermabond was applied. Patient tolerated the procedure well and was transferred to the recovery room in stable condition. All needle and sponge counts were correct.  Waverly Ferrari, MD, FACS [facility]  DATE OF DICTATION:   06/24/2012

## 2012-06-24 NOTE — Discharge Summary (Signed)
Family [facility] Discharge Summary  Patient name: John [name] Medical record number: [id number] Date of birth: 1956/03/03 Age: 57 y.o. 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 Hyperkalemia Metabolic acidosis Anemia, expected post op blood loss and anemia of chronic renal disease Prostatic Hyperplasia DM2 HTN Tobacco abuse  Consultations: Vascular Surgery, Nephrology  Significant Labs and Imaging:   Lab 06/24/12 0555 06/23/12 0515 06/22/12 0649 06/21/12 0700 06/20/12 1737  K 5.4* 4.4 4.4 4.9 5.0    Lab 06/24/12 0555 06/23/12 0515 06/22/12 0649 06/21/12 0700 06/20/12 1737  NA 141 142 141 142 143  K 5.4* 4.4 -- -- --  CL 107 108 107 109 109  CO2 18* 23 20 18* 20  GLUCOSE 103* 105* 168* 167* 137*  BUN 76* 73* 71* 64* 62*  CREATININE 4.31* 4.50* 4.51* 4.37* 4.57*  CALCIUM 8.7 8.4 8.4 8.7 8.5  MG -- -- -- -- --  PHOS 5.7* 5.9* 5.8* 6.3* --    Lab 06/24/12 0555 06/23/12 0515 06/22/12 0856 06/22/12 0649  HGB 8.9* 8.1* 8.9* --  HCT 28.0* 25.4* 28.4* --  WBC 10.8* 9.5 -- 9.9  PLT 196 178 -- 171   Results for orders placed during the hospital encounter of 06/15/12  SURGICAL PCR SCREEN     Status: Normal   Collection Time   06/18/12  1:30 AM      Component Value Range Status Comment   MRSA, PCR NEGATIVE  NEGATIVE Final    Staphylococcus aureus NEGATIVE  NEGATIVE Final   WOUND CULTURE     Status: Normal   Collection Time   06/18/12 12:45 PM      Component Value Range Status Comment   Specimen Description WOUND FOOT RIGHT   Final    Special Requests     Final    Value: SWAB FROM AMPUTATION SITE RT FOURTH AND FIFTH TOES PT ON ANCEF   Gram Stain     Final    Value: NO WBC SEEN     RARE SQUAMOUS EPITHELIAL CELLS  PRESENT     FEW GRAM NEGATIVE RODS   Culture MODERATE PSEUDOMONAS AERUGINOSA   Final    Report Status 06/21/2012 FINAL   Final    Organism ID, Bacteria PSEUDOMONAS AERUGINOSA   Final      Lower extremity Arterial Duplex  Summary: Probableaorto-iliac disease, with severe iliac disease on right and moderately severe on left. The right common femoral artery is patent with monophasic flow. The left common femoral artery appears to be stenotic with elevated velocities. The right femoral artery appears to be occluded in its mid segment with multi-collateral flow supplying the distal femoral artery and popliteal artery. The left femoral artery appears to be occluded just past its origin with multi-collateral flow supplying the distal femoral artery and popliteal artery. There is three vessel runoff bilaterally. There is possible moderate disease of the right anterior tibial artery and proximal left peroneal artery. The right external iliac artery demonstrates severely dampened monophasic flow. The left external iliac artery exhibits monophasic flow. Unable to visualize bilateral common iliac arteries and aorta.  ABI: Unable to insonate bilateral dorsalis pedis arteries, as well as the right posterior tibial artery. The left ABI=0.34, which is indicative of severe arterial insufficiency. Unable to calculate right ABI.  US Renal 06/16/2012  IMPRESSION:  1. Negative for hydronephrosis or acute abnormality.  2. Increased cortical echogenicity compatible with medical renal  disease.  3. Bilateral renal cysts as described   Dg Toe 4th Right 06/15/2012  IMPRESSION:  Diffuse soft tissue swelling. No plain film findings of  Osteomyelitis.   MR/MAR Abdomen, pelvis, BL LE without 06/17/2012  IMPRESSION:  1. High-grade stenosis or a short segment occlusion at the aortic  bifurcation extending a short segment into bilateral proximal  common iliac arteries.  2. Bilateral SFA occlusions with  popliteal reconstitution.  3. Contiguous left anterior tibial runoff to the foot.  4. Diseased right posterior tibial and peroneal runoff across the  right ankle.   Procedures:  - R Ax- Fem and Fem-Pop bypass with Ray amputation of the R 4th and 5th toes, 06/18/2012 - L AV graft 06/23/2012  Brief Hospital Course:  John [name] is a 57 y.o. 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 He was admitted and avscular surgery amputated the affected toes, addressed his circulation with a bypass surgery, and placed an AV graft. Renal was consulted who follwoed his potassium, metabolic acidosis, and reccommended AV graft for future dialysis access.   Ischemic Necrosis of Toes  Vascular surgery was consulted who could not do an angio as normal because of his poor renal function. Alternatively an MRI was performed showing widespread disease and he was taken for bypass surgery and amputation of his affected toes. They cultured the site at teh time of removal and used a synthetic vascular graft. He was treated with IV zosyn for 5 days when th ecultures and sensitivities grew pansensitive pseudomonas, so he was transitioned to PO cipro (renally dosed) to be continued through Jun 30, 2012. His wound was checked by vascular surgery on the day of discharge and was found to be without signs of infection.   Post-op pain He was initially managed with a dilaudid PCA and then converted to PO pain meds. He wa sdc'd with an rx to get 5-10 mg of oxycodone IR q 3 hours PRN (managed well with that prior to dc) and a separate Rx to fill his prior home chronic pain meds to be used after the 5 days of increased dose is finished.   Acute on chronic kidney disease The patient's baseline creatinine is 3.8, on presentation it was as high as 5.01. It improved to 3.5 during admission and settled around 4.5  Towards the end of admission. Nephrology was  consulted and preferred to get an AV fistula placed during admission, started sodium bicarb for metabolic acidosis, and used kayexelate intermittently for hyperkalemia. By th eend of admission he was net negative 5.4 liters.   Metabolic acidosis Thought to be 2/2 progressive CKD vs RTA of diabetes. He was started on sodium bicarb and had some improvement with that.   Hyperkalemia As high as 6.1 during admission. He never had any peaked t waves on EKG ad he was managed with kayexelate as needed.    Anemia-  Hgb was 12.0 on admission, down to a low of 8.1 post op. Stable at 8.9 before admission. Thought to be 2/2 to expected blood loss anemia and anemia of chronic renal disease. He was started on arinesp per nephrology.   Prostatic Hyperplasia  He had complaints of oliguria vs difficulty urinating and a large prostate on admission. A renal US was obtanied that  ruled out hydronephrosis or overt blockage. Two post void residuals on separate days were around a liter. A coude catheter was placed per urology's recommendations and a urology OP f/u appt was made. He was also started on flomax.    DM  Managed well on SSI and 20 od lantus daily.   HTN:  Managed well with amlodipine adn metoprolol 50 (increased during admission).   Smoking cessation  He used a patch during admission and had no complaints. He wants to quit.  Would benefit from further counseling.   Dispo: SNF  Discharge Medications:    Medication List     As of 06/24/2012  1:33 PM    STOP taking these medications         nortriptyline 25 MG capsule   Commonly known as: PAMELOR      sulfamethoxazole-trimethoprim 800-160 MG per tablet   Commonly known as: BACTRIM DS,SEPTRA DS      TAKE these medications         amLODipine 10 MG tablet   Commonly known as: NORVASC   Take 10 mg by mouth daily.      aspirin 81 MG chewable tablet   Chew 81 mg by mouth daily.      baclofen 10 MG tablet   Commonly known as: LIORESAL    Take 1 tablet (10 mg total) by mouth 2 (two) times daily.      ciprofloxacin 500 MG tablet   Commonly known as: CIPRO   Take 1 tablet (500 mg total) by mouth daily with breakfast.      CITRACAL + D PO   Take 1 tablet by mouth daily.      darbepoetin 100 MCG/0.5ML Soln   Commonly known as: ARANESP   Inject 0.5 mLs (100 mcg total) into the skin every Monday.      insulin glargine 100 UNIT/ML injection   Commonly known as: LANTUS   Inject 20 Units into the skin daily.      insulin lispro 100 UNIT/ML injection   Commonly known as: HUMALOG   Inject 5 Units into the skin 2 (two) times daily before lunch and supper. Per sliding scale.      ketoconazole 2 % cream   Commonly known as: NIZORAL   Apply topically daily. To feet      LASIX PO   Take 1 tablet by mouth 2 (two) times daily.      metoprolol succinate 25 MG 24 hr tablet   Commonly known as: TOPROL-XL   Take 1 tablet (25 mg total) by mouth daily.      oxyCODONE-acetaminophen 7.5-325 MG per tablet   Commonly known as: PERCOCET   Take 1 tablet by mouth every 6 (six) hours as needed for pain.      pravastatin 40 MG tablet   Commonly known as: PRAVACHOL   Take 1 tablet (40 mg total) by mouth daily.      simvastatin 80 MG tablet   Commonly known as: ZOCOR   TAKE 1 TABLET BY MOUTH AT BEDTIME      sodium bicarbonate 650 MG tablet   Take 1 tablet (650 mg total) by mouth 2 (two) times daily.      sodium polystyrene 15 GM/60ML suspension   Commonly known as: KAYEXALATE   Take 15 g by mouth See admin instructions. Takes every 3-4 days      Tamsulosin HCl 0.4 MG Caps   Commonly known as: FLOMAX   Take 2 capsules (0.8 mg total)  by mouth daily.      traZODone 50 MG tablet   Commonly known as: DESYREL   Take 1 tablet (50 mg total) by mouth at bedtime as needed for sleep.        Issues for Follow Up:  - Smoking cessation - CKD- trend CBC, monitor potassium and kayexelate as needed - Vascular appointment - BID dressing  changes with hydrogel  Outstanding Results: None  Follow-up Information    Follow up with Chuck Hint, MD. On 06/30/2012. (230 pm)    Contact information:   [address] [place] [address] [phone]       Follow up with Denna Haggard, NP. On 07/06/2012. (be there at 930)    Contact information:   [address] [place] [address] [phone]          Discharge Condition: Carlena Sax, MD 06/24/2012, 1:33 PM

## 2012-06-24 NOTE — Progress Notes (Signed)
Family Medicine Teaching Service Daily Progress Note Service Page: [phone]  Pt assessment:  57 y.o. 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   Subjective:  Pain controlled, no dyspnea or chest pain. Some soreness on R side from the gait belt a couple of days ago, hurts worse with movement. Wants to go to SNF tomorrow.    Objective: Temp:  [98.4 F (36.9 C)-99 F (37.2 C)] 99 F (37.2 C) (01/16 0514) Pulse Rate:  [88-94] 92  (01/16 0514) Resp:  [18-20] 18  (01/16 0514) BP: (140-156)/(68-81) 140/69 mmHg (01/16 0514) SpO2:  [100 %] 100 % (01/16 0514) Weight:  [199 lb 14.4 oz (90.674 kg)] 199 lb 14.4 oz (90.674 kg) (01/16 0500)   Intake/Output Summary (Last 24 hours) at 06/24/12 0808 Last data filed at 06/24/12 0517  Gross per 24 hour  Intake    480 ml  Output   1775 ml  Net  -1295 ml   Physical Exam: General: resting comfortably in bed. NAD, Clean dry, intact incision on  R leg and R chest Cardiovascular: RRR. No murmurs, rubs, or gallops. Respiratory: CTAB. No rales, rhonchi, or wheezing. Abdomen: soft, tenderness to palp of R flank and infraxillary region, no abrasion or erythema Extremities: R foot with clean, dry intact dressing. Warm R leg,  Neuro: No focal deficits, alert and oriented  CBC BMET   Lab 06/24/12 0555 06/23/12 0515 06/22/12 0856 06/22/12 0649  WBC 10.8* 9.5 -- 9.9  HGB 8.9* 8.1* 8.9* --  HCT 28.0* 25.4* 28.4* --  PLT 196 178 -- 171    Lab 06/24/12 0555 06/23/12 0515 06/22/12 0649  NA 141 142 141  K 5.4* 4.4 4.4  CL 107 108 107  CO2 18* 23 20  BUN 76* 73* 71*  CREATININE 4.31* 4.50* 4.51*  GLUCOSE 103* 105* 168*  CALCIUM 8.7 8.4 8.4     Imaging/Diagnostic Tests:  Lower extremity Arterial Duplex Summary: Probableaorto-iliac disease, with severe iliac disease on right and  moderately severe on left. The right common femoral artery is patent with monophasic flow. The left common femoral artery appears to be stenotic with elevated velocities. The right femoral artery appears to be occluded in its mid segment with multi-collateral flow supplying the distal femoral artery and popliteal artery. The left femoral artery appears to be occluded just past its origin with multi-collateral flow supplying the distal femoral artery and popliteal artery. There is three vessel runoff bilaterally. There is possible moderate disease of the right anterior tibial artery and proximal left peroneal artery. The right external iliac artery demonstrates severely dampened monophasic flow. The left external iliac artery exhibits monophasic flow. Unable to visualize bilateral common iliac arteries and aorta.  ABI: Unable to insonate bilateral dorsalis pedis arteries, as well as the right posterior tibial artery. The left ABI=0.34, which is indicative of severe arterial insufficiency. Unable to calculate right ABI.  US Renal 06/16/2012 IMPRESSION:  1. Negative for hydronephrosis or acute abnormality.  2. Increased cortical echogenicity compatible with medical renal  disease.  3. Bilateral renal cysts as described  Dg Toe 4th Right 06/15/2012 IMPRESSION:  Diffuse soft tissue swelling. No plain film findings of  Osteomyelitis.  MR/MAR Abdomen, pelvis, BL LE without 06/17/2012 IMPRESSION:  1. High-grade stenosis or a short segment occlusion at the aortic  bifurcation extending a short  segment into bilateral proximal  common iliac arteries.  2. Bilateral SFA occlusions with popliteal reconstitution.  3. Contiguous left anterior tibial runoff to the foot.  4. Diseased right posterior tibial and peroneal runoff across the  right ankle.  Assessment/Plan: John [name] is a 57 y.o. year old male presenting with ischemic necrosis of his 4th and 5th digits of the right foot and  acute on chronic kidney injury. Now S/p bypass and amuputation post op day 6  Ischemic Necrosis of Toes  - Vascular surgery on board- appreciate reccs and treatment  - A/V fistula per renal reccs today, vascular mapping complete - Amputation and revascularization by VVS on 06/18/2012  R Ax-fem bypass, R Fem-pop bypass, Amputation of 4th and 5th digits.  - Wound culture grew pan-sensitive pseudomonas - Initially started on IV zosyn post op, transitioned now to Cipro, needed 2/2 infection and need of artificial graft   - total duration of Tx- 7  - last day of therapy 1/22, appreciate VVS' help.  - Dressing clean, dry intact - Pain controlled well on current regimen - PRN oxycodone   Acute on chronic kidney disease - improving - Patient has CKD stage 4 (baseline Creatinine 3.8) and had acute rise in creatinine to 5.18 on admission. - Renal following - appreciate reccs  - Patient will need placement of AVF per renal reccs- VVS planning placement on friday  - Sodium bicarb for metabolic acidosis 2/2 progressive CKD vs RTA of diabetes - Creatinine steady around 4.5  - UOP 2 L on 1/11, Negative 5.4 L for admission.  - will continue to monitor closely   Metabolic acidosis- slightly worsened today - CO2 18 from 23 today - 2/2 progressive CKD vs RTA of diabetes - renal on board apppreciate reccs - sodium bicarb per renal  Hyperkalemia - resolved currently - 5.4 this am,  - Kayexelate 30 g X 1 now - Kayexalate as needed - follow with daily BMP, consider evening K check  Anemia- stable - Hgb 12.0 on admission, down to 8.9 today  - likely anemia of renal disease and expected post-op anemia - starting arinesp today  Prostatic Hyperplasia - Flomax daily, bladder scan showed approx 1L post void X2 - Coude catheter placed 1/13  - urology f/u OP - avoid anticholinergics- namely benedryl for sleep (PRN trazadone)  DM  - SSI and Lantus 20 Units  - Good control at this time -  CBG's  76-126  HTN  - controlled 140-156/68-81, P 88-94 - Cont amlodipine 10 mg, metoprolol succinate 50 mg  Smoking cessation - has cut down to 1/2 ppd and wants to quit.  - nicotine patch helping - f/u OP with our smoking cessation specialist pharmacist  FENGI: carb modified diet PPX: Heparin sub Q  DISPO: Patient to undergo revascularization and amputation as well as placement of AVF. CODE STATUS: full code  Kevin Fenton, MD 06/24/2012, 8:08 AM

## 2012-06-24 NOTE — Clinical Social Work Note (Signed)
CSW followed up with Pt to discuss rehab at [facility] and personal goals. 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 CSW discussed benefits of increased PT at SNF vs HH.  Pt met with SNF liaison to discuss program. SNF bed is ready when Pt is medically cleared for dc.   CSW will continue to f/u. Fl2 prepared and in chart for MD signature.   Frederico Hamman, LCSW [phone]

## 2012-06-24 NOTE — Interval H&P Note (Signed)
History and Physical Interval Note:  06/24/2012 9:03 AM  Higinio Plan  has presented today for surgery, with the diagnosis of ESRD  The various methods of treatment have been discussed with the patient and family. After consideration of risks, benefits and other options for treatment, the patient has consented to  Procedure(s) (LRB) with comments: ARTERIOVENOUS (AV) FISTULA CREATION (Left) - VS GRAFT INSERTION as a surgical intervention .  The patient's history has been reviewed, patient examined, no change in status, stable for surgery.  I have reviewed the patient's chart and labs.  Questions were answered to the patient's satisfaction.     DICKSON,CHRISTOPHER S

## 2012-06-25 ENCOUNTER — Encounter (HOSPITAL_COMMUNITY): Payer: Self-pay | Admitting: Vascular Surgery

## 2012-06-25 ENCOUNTER — Telehealth: Payer: Self-pay | Admitting: *Deleted

## 2012-06-25 NOTE — Telephone Encounter (Signed)
Dr Ermalinda Memos and I sent this form BACk to them as ARANESP has to come from Dr. Hyman Hopes the nephrologist. We faxed itback already and will continue to fax back all the future copies that I know we will receive THANKS! [name]

## 2012-06-25 NOTE — Telephone Encounter (Signed)
PA required for Aranesp. Form placed in MD box.

## 2012-06-28 ENCOUNTER — Inpatient Hospital Stay (HOSPITAL_COMMUNITY)
Admission: EM | Admit: 2012-06-28 | Discharge: 2012-07-03 | DRG: 291 | Disposition: A | Discharge status: Home Health | Payer: Medicare Other | Attending: Family Medicine | Admitting: Family Medicine

## 2012-06-28 ENCOUNTER — Encounter (HOSPITAL_COMMUNITY): Payer: Self-pay | Admitting: Emergency Medicine

## 2012-06-28 ENCOUNTER — Emergency Department (HOSPITAL_COMMUNITY): Payer: Medicare Other

## 2012-06-28 DIAGNOSIS — N039 Chronic nephritic syndrome with unspecified morphologic changes: Secondary | ICD-10-CM | POA: Diagnosis present

## 2012-06-28 DIAGNOSIS — I70209 Unspecified atherosclerosis of native arteries of extremities, unspecified extremity: Secondary | ICD-10-CM | POA: Diagnosis present

## 2012-06-28 DIAGNOSIS — Z79899 Other long term (current) drug therapy: Secondary | ICD-10-CM

## 2012-06-28 DIAGNOSIS — J4489 Other specified chronic obstructive pulmonary disease: Secondary | ICD-10-CM | POA: Diagnosis present

## 2012-06-28 DIAGNOSIS — N400 Enlarged prostate without lower urinary tract symptoms: Secondary | ICD-10-CM | POA: Diagnosis present

## 2012-06-28 DIAGNOSIS — E785 Hyperlipidemia, unspecified: Secondary | ICD-10-CM | POA: Diagnosis present

## 2012-06-28 DIAGNOSIS — Z8673 Personal history of transient ischemic attack (TIA), and cerebral infarction without residual deficits: Secondary | ICD-10-CM

## 2012-06-28 DIAGNOSIS — E1142 Type 2 diabetes mellitus with diabetic polyneuropathy: Secondary | ICD-10-CM | POA: Diagnosis present

## 2012-06-28 DIAGNOSIS — Z794 Long term (current) use of insulin: Secondary | ICD-10-CM

## 2012-06-28 DIAGNOSIS — F17200 Nicotine dependence, unspecified, uncomplicated: Secondary | ICD-10-CM | POA: Diagnosis present

## 2012-06-28 DIAGNOSIS — Z7982 Long term (current) use of aspirin: Secondary | ICD-10-CM

## 2012-06-28 DIAGNOSIS — M549 Dorsalgia, unspecified: Secondary | ICD-10-CM | POA: Diagnosis present

## 2012-06-28 DIAGNOSIS — N185 Chronic kidney disease, stage 5: Secondary | ICD-10-CM | POA: Diagnosis present

## 2012-06-28 DIAGNOSIS — G8929 Other chronic pain: Secondary | ICD-10-CM | POA: Diagnosis present

## 2012-06-28 DIAGNOSIS — E1149 Type 2 diabetes mellitus with other diabetic neurological complication: Secondary | ICD-10-CM | POA: Diagnosis present

## 2012-06-28 DIAGNOSIS — N179 Acute kidney failure, unspecified: Secondary | ICD-10-CM | POA: Diagnosis present

## 2012-06-28 DIAGNOSIS — J449 Chronic obstructive pulmonary disease, unspecified: Secondary | ICD-10-CM | POA: Diagnosis present

## 2012-06-28 DIAGNOSIS — I6529 Occlusion and stenosis of unspecified carotid artery: Secondary | ICD-10-CM | POA: Diagnosis present

## 2012-06-28 DIAGNOSIS — D631 Anemia in chronic kidney disease: Secondary | ICD-10-CM | POA: Diagnosis present

## 2012-06-28 DIAGNOSIS — IMO0001 Reserved for inherently not codable concepts without codable children: Principal | ICD-10-CM | POA: Diagnosis present

## 2012-06-28 DIAGNOSIS — I5033 Acute on chronic diastolic (congestive) heart failure: Secondary | ICD-10-CM | POA: Diagnosis present

## 2012-06-28 DIAGNOSIS — N2581 Secondary hyperparathyroidism of renal origin: Secondary | ICD-10-CM | POA: Diagnosis present

## 2012-06-28 DIAGNOSIS — N4889 Other specified disorders of penis: Secondary | ICD-10-CM | POA: Diagnosis present

## 2012-06-28 DIAGNOSIS — I509 Heart failure, unspecified: Secondary | ICD-10-CM | POA: Diagnosis present

## 2012-06-28 DIAGNOSIS — I739 Peripheral vascular disease, unspecified: Secondary | ICD-10-CM | POA: Diagnosis present

## 2012-06-28 LAB — CBC
HCT: 22.8 % — ABNORMAL LOW (ref 39.0–52.0)
Hemoglobin: 7.1 g/dL — ABNORMAL LOW (ref 13.0–17.0)
MCH: 28.2 pg (ref 26.0–34.0)
MCHC: 31.1 g/dL (ref 30.0–36.0)

## 2012-06-28 LAB — URINALYSIS, ROUTINE W REFLEX MICROSCOPIC
Ketones, ur: NEGATIVE mg/dL
Protein, ur: 100 mg/dL — AB
Urobilinogen, UA: 0.2 mg/dL (ref 0.0–1.0)

## 2012-06-28 LAB — CBC WITH DIFFERENTIAL/PLATELET
Basophils Absolute: 0.1 10*3/uL (ref 0.0–0.1)
Basophils Relative: 0 % (ref 0–1)
Eosinophils Absolute: 0.1 10*3/uL (ref 0.0–0.7)
Eosinophils Relative: 1 % (ref 0–5)
HCT: 23.7 % — ABNORMAL LOW (ref 39.0–52.0)
Hemoglobin: 7.4 g/dL — ABNORMAL LOW (ref 13.0–17.0)
Lymphocytes Relative: 12 % (ref 12–46)
Lymphocytes Relative: 14 % (ref 12–46)
Lymphs Abs: 1.5 10*3/uL (ref 0.7–4.0)
MCHC: 32 g/dL (ref 30.0–36.0)
MCV: 90.7 fL (ref 78.0–100.0)
Monocytes Absolute: 1.3 10*3/uL — ABNORMAL HIGH (ref 0.1–1.0)
Monocytes Absolute: 1.4 10*3/uL — ABNORMAL HIGH (ref 0.1–1.0)
Monocytes Relative: 10 % (ref 3–12)
Neutro Abs: 10.1 10*3/uL — ABNORMAL HIGH (ref 1.7–7.7)
Platelets: 245 10*3/uL (ref 150–400)
RBC: 2.61 MIL/uL — ABNORMAL LOW (ref 4.22–5.81)
RDW: 14.8 % (ref 11.5–15.5)
WBC: 13.1 10*3/uL — ABNORMAL HIGH (ref 4.0–10.5)
WBC: 13.8 10*3/uL — ABNORMAL HIGH (ref 4.0–10.5)

## 2012-06-28 LAB — COMPREHENSIVE METABOLIC PANEL
Alkaline Phosphatase: 200 U/L — ABNORMAL HIGH (ref 39–117)
BUN: 92 mg/dL — ABNORMAL HIGH (ref 6–23)
CO2: 19 mEq/L (ref 19–32)
Chloride: 102 mEq/L (ref 96–112)
Creatinine, Ser: 5.46 mg/dL — ABNORMAL HIGH (ref 0.50–1.35)
GFR calc non Af Amer: 10 mL/min — ABNORMAL LOW (ref >90)
Total Bilirubin: 0.3 mg/dL (ref 0.3–1.2)

## 2012-06-28 LAB — PROTIME-INR: INR: 1.16 (ref 0.00–1.49)

## 2012-06-28 LAB — BASIC METABOLIC PANEL
CO2: 19 mEq/L (ref 19–32)
Calcium: 8.3 mg/dL — ABNORMAL LOW (ref 8.4–10.5)
Creatinine, Ser: 5.43 mg/dL — ABNORMAL HIGH (ref 0.50–1.35)
GFR calc Af Amer: 12 mL/min — ABNORMAL LOW (ref >90)
GFR calc non Af Amer: 11 mL/min — ABNORMAL LOW (ref >90)
Sodium: 139 mEq/L (ref 135–145)

## 2012-06-28 LAB — CREATININE, SERUM: GFR calc non Af Amer: 10 mL/min — ABNORMAL LOW (ref >90)

## 2012-06-28 LAB — URINE MICROSCOPIC-ADD ON

## 2012-06-28 LAB — POCT I-STAT TROPONIN I: Troponin i, poc: 0.05 ng/mL (ref 0.00–0.08)

## 2012-06-28 LAB — APTT: aPTT: 35 seconds (ref 24–37)

## 2012-06-28 LAB — GLUCOSE, CAPILLARY

## 2012-06-28 MED ORDER — FUROSEMIDE 10 MG/ML IJ SOLN
80.0000 mg | Freq: Four times a day (QID) | INTRAMUSCULAR | Status: DC
  Administered 2012-06-28 – 2012-07-02 (×14): 80 mg via INTRAVENOUS
  Filled 2012-06-28 (×17): qty 8

## 2012-06-28 MED ORDER — INSULIN GLARGINE 100 UNIT/ML ~~LOC~~ SOLN
20.0000 [IU] | Freq: Every day | SUBCUTANEOUS | Status: DC
  Administered 2012-06-28: 20 [IU] via SUBCUTANEOUS

## 2012-06-28 MED ORDER — BACLOFEN 10 MG PO TABS
10.0000 mg | ORAL_TABLET | Freq: Two times a day (BID) | ORAL | Status: DC
  Administered 2012-06-28 – 2012-07-03 (×10): 10 mg via ORAL
  Filled 2012-06-28 (×11): qty 1

## 2012-06-28 MED ORDER — AMLODIPINE BESYLATE 10 MG PO TABS: 10.0000 mg | ORAL_TABLET | Freq: Every day | ORAL | Status: DC

## 2012-06-28 MED ORDER — SODIUM CHLORIDE 0.9 % IV SOLN: 250.0000 mL | INTRAVENOUS | Status: DC | PRN

## 2012-06-28 MED ORDER — METOPROLOL SUCCINATE ER 25 MG PO TB24: 25.0000 mg | ORAL_TABLET | Freq: Every day | ORAL | Status: DC

## 2012-06-28 MED ORDER — TAMSULOSIN HCL 0.4 MG PO CAPS
0.8000 mg | ORAL_CAPSULE | Freq: Every day | ORAL | Status: DC
  Administered 2012-06-29 – 2012-07-03 (×5): 0.8 mg via ORAL
  Filled 2012-06-28 (×5): qty 2

## 2012-06-28 MED ORDER — CIPROFLOXACIN HCL 500 MG PO TABS
500.0000 mg | ORAL_TABLET | Freq: Every day | ORAL | Status: DC
  Administered 2012-06-29 – 2012-06-30 (×2): 500 mg via ORAL
  Filled 2012-06-28 (×3): qty 1

## 2012-06-28 MED ORDER — OXYCODONE-ACETAMINOPHEN 5-325 MG PO TABS
2.0000 | ORAL_TABLET | Freq: Once | ORAL | Status: AC
  Administered 2012-06-28: 2 via ORAL
  Filled 2012-06-28: qty 2

## 2012-06-28 MED ORDER — TRAZODONE HCL 50 MG PO TABS: 50.0000 mg | ORAL_TABLET | Freq: Every evening | ORAL | Status: DC | PRN

## 2012-06-28 MED ORDER — SODIUM CHLORIDE 0.9 % IJ SOLN
3.0000 mL | Freq: Two times a day (BID) | INTRAMUSCULAR | Status: DC
  Administered 2012-06-28 – 2012-07-03 (×10): 3 mL via INTRAVENOUS

## 2012-06-28 MED ORDER — FUROSEMIDE 10 MG/ML IJ SOLN
20.0000 mg | Freq: Once | INTRAMUSCULAR | Status: AC
  Administered 2012-06-28: 20 mg via INTRAVENOUS
  Filled 2012-06-28: qty 2

## 2012-06-28 MED ORDER — DARBEPOETIN ALFA-POLYSORBATE 100 MCG/0.5ML IJ SOLN
100.0000 ug | INTRAMUSCULAR | Status: DC
  Administered 2012-06-28: 100 ug via SUBCUTANEOUS
  Filled 2012-06-28: qty 0.5

## 2012-06-28 MED ORDER — OXYCODONE HCL 5 MG PO TABS
5.0000 mg | ORAL_TABLET | Freq: Four times a day (QID) | ORAL | Status: DC | PRN
  Administered 2012-06-29 – 2012-07-03 (×13): 10 mg via ORAL
  Filled 2012-06-28 (×2): qty 2
  Filled 2012-06-28: qty 1
  Filled 2012-06-28 (×5): qty 2
  Filled 2012-06-28: qty 1
  Filled 2012-06-28 (×5): qty 2
  Filled 2012-06-28: qty 10
  Filled 2012-06-28: qty 2

## 2012-06-28 MED ORDER — ATORVASTATIN CALCIUM 40 MG PO TABS
40.0000 mg | ORAL_TABLET | Freq: Every day | ORAL | Status: DC
  Administered 2012-06-29 – 2012-07-02 (×4): 40 mg via ORAL
  Filled 2012-06-28 (×5): qty 1

## 2012-06-28 MED ORDER — ASPIRIN 81 MG PO CHEW
81.0000 mg | CHEWABLE_TABLET | Freq: Every day | ORAL | Status: DC
  Administered 2012-06-28 – 2012-07-03 (×6): 81 mg via ORAL
  Filled 2012-06-28 (×6): qty 1

## 2012-06-28 MED ORDER — SODIUM CHLORIDE 0.9 % IJ SOLN: 3.0000 mL | INTRAMUSCULAR | Status: DC | PRN

## 2012-06-28 MED ORDER — FUROSEMIDE 10 MG/ML IJ SOLN
80.0000 mg | Freq: Four times a day (QID) | INTRAMUSCULAR | Status: DC
  Filled 2012-06-28: qty 8

## 2012-06-28 MED ORDER — SODIUM BICARBONATE 650 MG PO TABS
650.0000 mg | ORAL_TABLET | Freq: Two times a day (BID) | ORAL | Status: DC
  Administered 2012-06-28 – 2012-07-03 (×10): 650 mg via ORAL
  Filled 2012-06-28 (×11): qty 1

## 2012-06-28 MED ORDER — ACETAMINOPHEN 325 MG PO TABS
650.0000 mg | ORAL_TABLET | ORAL | Status: DC | PRN
  Administered 2012-06-29 – 2012-07-03 (×8): 650 mg via ORAL
  Filled 2012-06-28 (×8): qty 2

## 2012-06-28 MED ORDER — FUROSEMIDE 10 MG/ML IJ SOLN
80.0000 mg | Freq: Two times a day (BID) | INTRAMUSCULAR | Status: DC
Start: 2012-06-28 — End: 2012-06-28

## 2012-06-28 MED ORDER — NICOTINE 7 MG/24HR TD PT24
7.0000 mg | MEDICATED_PATCH | Freq: Every day | TRANSDERMAL | Status: DC
  Administered 2012-06-28 – 2012-07-03 (×7): 7 mg via TRANSDERMAL
  Filled 2012-06-28 (×6): qty 1

## 2012-06-28 MED ORDER — ONDANSETRON HCL 4 MG/2ML IJ SOLN
4.0000 mg | Freq: Four times a day (QID) | INTRAMUSCULAR | Status: DC | PRN
  Administered 2012-07-01 – 2012-07-03 (×3): 4 mg via INTRAVENOUS
  Filled 2012-06-28: qty 4
  Filled 2012-06-28 (×2): qty 2

## 2012-06-28 MED ORDER — ENOXAPARIN SODIUM 30 MG/0.3ML ~~LOC~~ SOLN
30.0000 mg | SUBCUTANEOUS | Status: DC
  Administered 2012-06-28 – 2012-07-02 (×5): 30 mg via SUBCUTANEOUS
  Filled 2012-06-28 (×7): qty 0.3

## 2012-06-28 NOTE — ED Notes (Signed)
PA student at the bedside.  

## 2012-06-28 NOTE — ED Provider Notes (Signed)
History     CSN: [id number]  Arrival date & time 06/28/12  1304   First MD Initiated Contact with Patient 06/28/12 1423      Chief Complaint  Patient presents with  . 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  Patient reports that he has been feeling short of breath since last evening.  SOB gradually worsening.  He denies chest pain, dizziness, lightheadedness, syncope, nausea, or vomiting.  He reports that he tried taking an Albuterol inhaler, but did not feel that it helped.  He has had an occasional cough, but no fever.  He reports that his pulse ox became low this morning while at the SNF.  He was therefore, put on 2L [place] oxygen.  He is normally not on oxygen.  He also reports that he was told by the staff at the SNF that his blood pressure was low and that his pulse was weak.  He is unsure what his blood pressure was.  BP 115/58 upon arrival in the ED.  Patient recently had his right 4th and 5th toes amputated.  No drainage at the site.  No surrounding erythema or edema. He is having pain on the toes of his left foot.  He has taken Percocet, but does not feel that it helps.   He has a history of CKD.  He had an AV fistula placed recently, but has not yet started dialysis.  PCP is Dr. [name].    The history is provided by the patient and the spouse.    Past Medical History  Diagnosis Date  . COPD (chronic obstructive pulmonary disease)   . Chronic back pain     Chronic narcotics for this for many years  . Diabetes mellitus   . Hyperlipidemia   . Hypertension   . Foot pain   . Peripheral vascular disease   . Asthma   . Stroke     No residual "Mini strokes"  . Ulcer 1985  . Fracture of foot      [place]- Right  . Chronic kidney disease     CKD stage III  . Neuromuscular disorder     periferal neuropathy    Past Surgical History  Procedure Date  . Neck surgery   . Eye surgery   . Cardiac catheterization   . Endarterectomy 12/18/2011    Procedure: ENDARTERECTOMY CAROTID;  Surgeon: Chuck Hint, MD;  Location: [facility] OR;  Service: Vascular;  Laterality: Left;  . Carotid endarterectomy   . Axillary-femoral bypass graft 06/18/2012    Procedure: BYPASS GRAFT AXILLA-BIFEMORAL;  Surgeon: Chuck Hint, MD;  Location: [facility] OR;  Service: Vascular;  Laterality: Right;  Right Axillo to Right Femoral Bypass Graft  . Femoral-popliteal bypass graft 06/18/2012    Procedure: BYPASS GRAFT FEMORAL-POPLITEAL ARTERY;  Surgeon: Chuck Hint, MD;  Location: [facility] OR;  Service: Vascular;  Laterality: Right;  Right Femoral to Above Knee Popliteal Bypass Graft  . Amputation 06/18/2012    Procedure: AMPUTATION DIGIT;  Surgeon: Chuck Hint, MD;  Location: [facility] OR;  Service: Vascular;  Laterality: Right;  Right side, amputation of fourth and fifth toes  . Av fistula placement 06/24/2012    Procedure: ARTERIOVENOUS (AV) FISTULA CREATION;  Surgeon: Chuck Hint, MD;  Location: [facility] OR;  Service: Vascular;  Laterality: Left;    Family History  Problem Relation Age of Onset  . Diabetes Mother   . Heart disease Mother   . Hypertension Mother   . Heart attack Mother   . Diabetes Father   . Heart disease Father   . Hypertension Father   . Heart attack Father   . Other Father     bleeding problems  . Diabetes Brother   . Heart disease Brother     History  Substance Use Topics  . Smoking status: Current Every Day Smoker -- 0.5 packs/day for 40 years    Types: Cigarettes  . Smokeless tobacco: Never Used     Comment: pt states that he is trying to quit  . Alcohol Use: No     Comment: Previous drinker - However, quit in 2006      Review of Systems  Constitutional:  Negative for fever and chills.  Respiratory: Positive for cough and shortness of breath. Negative for chest tightness and wheezing.        Positive for orthopnea and bilateral LE edema  Cardiovascular: Positive for leg swelling. Negative for chest pain.  Gastrointestinal: Negative for nausea, vomiting and abdominal pain.  Neurological: Negative for dizziness, syncope, facial asymmetry, light-headedness and headaches.  Psychiatric/Behavioral: Negative for confusion.  All other systems reviewed and are negative.    Allergies  Gabapentin and Vicodin  Home Medications   Current Outpatient Rx  Name  Route  Sig  Dispense  Refill  . AMLODIPINE BESYLATE 10 MG PO TABS   Oral   Take 10 mg by mouth daily.         . ASPIRIN 81 MG PO CHEW   Oral   Chew 81 mg by mouth daily.         [place] Kitchen BACLOFEN 10 MG PO TABS   Oral   Take 1 tablet (10 mg total) by mouth 2 (two) times daily.   30 each   1   . CITRACAL + D PO   Oral   Take 1 tablet by mouth daily.          [place] Kitchen LASIX PO   Oral   Take 1 tablet by mouth 2 (two) times daily.         . INSULIN LISPRO (HUMAN) 100 UNIT/ML [place] SOLN   Subcutaneous   Inject 5 Units into the skin 2 (two) times daily before lunch and supper. Per sliding scale.         [place] Kitchen KETOCONAZOLE 2 % EX CREA   Topical   Apply topically daily. To feet   15 g   0   . METOPROLOL SUCCINATE ER 25 MG PO TB24   Oral   Take 1 tablet (25 mg total) by mouth daily.   30 tablet   11   . PRAVASTATIN SODIUM 40 MG PO TABS   Oral   Take 1 tablet (40 mg total) by mouth daily.   90 tablet   4   . SODIUM POLYSTYRENE SULFONATE 15 GM/60ML PO SUSP   Oral   Take 15 g by mouth See admin instructions. Takes every 3-4 days         . CIPROFLOXACIN HCL 500 MG PO TABS   Oral   Take 1 tablet (500 mg total) by mouth daily with breakfast.   6 tablet   0   . DARBEPOETIN ALFA-POLYSORBATE 100 MCG/0.5ML IJ SOLN   Subcutaneous   Inject 0.5 mLs (100 mcg total) into the skin every  Monday.   4.2 mL   0   . INSULIN GLARGINE 100 UNIT/ML [place] SOLN   Subcutaneous   Inject 20 Units into the skin daily.   10 mL   0   . NICOTINE 7 MG/24HR TD PT24   Transdermal   Place 1 patch onto the skin daily.   28 patch   0   . OXYCODONE HCL 5 MG PO TABS   Oral   Take 1-2 tablets (5-10 mg total) by mouth every 3 (three) hours as needed.   40 tablet   0   . OXYCODONE-ACETAMINOPHEN 7.5-325 MG PO TABS   Oral   Take 1 tablet by mouth every 6 (six) hours as needed for pain (after Jan 20, (increased dose before that- seperate Rx).   120 tablet   0   . SIMVASTATIN 80 MG PO TABS   Oral   Take 80 mg by mouth at bedtime.         . SODIUM BICARBONATE 650 MG PO TABS   Oral   Take 1 tablet (650 mg total) by mouth 2 (two) times daily.   60 tablet   0   . TAMSULOSIN HCL 0.4 MG PO CAPS   Oral   Take 2 capsules (0.8 mg total) by mouth daily.   60 capsule   0   . TRAZODONE HCL 50 MG PO TABS   Oral   Take 1 tablet (50 mg total) by mouth at bedtime as needed for sleep.   30 tablet   0     BP 123/49  Pulse 96  Temp 98.9 F (37.2 C) (Oral)  Resp 23  Ht 5\' 11"  (1.803 m)  Wt 200 lb (90.719 kg)  BMI 27.89 kg/m2  SpO2 92%  Physical Exam  Nursing note and vitals reviewed. Constitutional: He appears well-developed and well-nourished. No distress.  HENT:  Head: Normocephalic and atraumatic.  Mouth/Throat: Oropharynx is clear and moist.  Neck: Normal range of motion. Neck supple.  Cardiovascular: Normal rate, regular rhythm and normal heart sounds.   Pulmonary/Chest: Effort normal. No respiratory distress. He has no wheezes. He has rales. He exhibits no tenderness.       Crackles at bases of both lungs  Abdominal: Soft. Bowel sounds are normal. There is no tenderness.  Musculoskeletal: Normal range of motion.       2+ Edema R >L, R foot with recent surgical site of removal 4th and 5th digits without surrounding erythema or induration.  No drainage -Blood crusting under  R great toe      Neurological: He is alert.  Skin: Skin is warm and dry. He is not diaphoretic. No erythema.  Psychiatric: He has a normal mood and affect.    ED Course  Procedures (including critical care time)  Labs Reviewed  CBC WITH DIFFERENTIAL - Abnormal; Notable for the following:    WBC 13.1 (*)     RBC 2.61 (*)     Hemoglobin 7.4 (*)     HCT 23.7 (*)     Neutro Abs 10.1 (*)     Monocytes Absolute 1.3 (*)     All other components within normal limits  COMPREHENSIVE METABOLIC PANEL - Abnormal; Notable for the following:    Glucose, Bld 155 (*)     BUN 92 (*)     Creatinine, Ser 5.46 (*)     Calcium 8.3 (*)     Albumin 2.8 (*)     Alkaline Phosphatase 200 (*)  GFR calc non Af Amer 10 (*)     GFR calc Af Amer 12 (*)     All other components within normal limits  CBC WITH DIFFERENTIAL - Abnormal; Notable for the following:    WBC 13.8 (*)     RBC 2.79 (*)     Hemoglobin 8.1 (*)     HCT 25.3 (*)     Neutro Abs 10.3 (*)     Monocytes Absolute 1.4 (*)     All other components within normal limits  BASIC METABOLIC PANEL - Abnormal; Notable for the following:    Glucose, Bld 139 (*)     BUN 92 (*)     Creatinine, Ser 5.43 (*)     Calcium 8.3 (*)     GFR calc non Af Amer 11 (*)     GFR calc Af Amer 12 (*)     All other components within normal limits  PRO B NATRIURETIC PEPTIDE - Abnormal; Notable for the following:    Pro B Natriuretic peptide (BNP) 33516.0 (*)     All other components within normal limits  D-DIMER, QUANTITATIVE - Abnormal; Notable for the following:    D-Dimer, Quant 4.49 (*)     All other components within normal limits  PROTIME-INR  APTT  POCT I-STAT TROPONIN I  URINALYSIS, ROUTINE W REFLEX MICROSCOPIC   Dg Chest 2 View  06/28/2012  *RADIOLOGY REPORT*  Clinical Data: Shortness of breath.  CHEST - 2 VIEW  Comparison: PA and lateral chest 12/10/2011 and 09/12/2010.  Findings: There is cardiomegaly.  New interstitial prominence is likely due  to mild edema.  No consolidative process, pneumothorax or effusion is identified.  IMPRESSION: Cardiomegaly and mild interstitial prominence likely due to edema.   Original Report Authenticated By: Holley Dexter, M.D.      No diagnosis found.   Date: 06/28/2012  Rate: 93  Rhythm: normal sinus rhythm  QRS Axis: normal  Intervals: normal  ST/T Wave abnormalities: nonspecific T wave changes  Conduction Disutrbances:none  Narrative Interpretation:   Old EKG Reviewed: unchanged  Discussed with Family Practice service who has agreed to admit the patients.  MDM  Patient with multiple medical problems presents today with a chief complaint of dyspnea with new oxygen requirement.  No wheezing on exam.  No acute changes on EKG. Patient denies CP.  CXR showing cardiomegaly and pulmonary edema.  BNP is 33[address].  Patient also with LE edema on exam.  Patient appears to be in fluid overload, which could be causing his dyspnea.  Patient with known CKD.  No known history of CHF.  Fluid overload could be result of kidney disease or new onset CHF.  Patient given dose of IV Lasix.  Family practice service has agreed to admit the patient for further management.          Pascal Lux [place], PA-C 06/29/12 1521

## 2012-06-28 NOTE — ED Notes (Signed)
Pt brought in from [facility] SNF. Staff reported to EMS pt had hypotension and low SpO2. On EMS arrival pt BP 134/70 and on ED arrival 126/70. SpO2 on EMS arrival high 90's. Pt CKD stage 3 with new fistula placed on Thursday in left upper arm. Pt hx of anemia. Paper work sent from lab work on 06/25/2012

## 2012-06-28 NOTE — ED Notes (Signed)
Herbert Seta, PA and PA student back at the bedside.

## 2012-06-28 NOTE — Consult Note (Addendum)
Referring Provider: No ref. provider found Primary Care Physician:  [name], MD Primary Nephrologist:  Dr. Kathrene Bongo  Reason for Consultation: Stage V CKD , now admitted with volume overload  HPI:   BYPASS GRAFT AXILLA-BIFEMORAL (Right) - Right Axillo to Right Femoral Bypass Graft  BYPASS GRAFT FEMORAL-POPLITEAL ARTERY (Right) - Right Femoral to Above Knee Popliteal Bypass Graft  AMPUTATION DIGIT (Right) - Right side, amputation of fourth and fifth toes 06/18/2012  L AV graft 06/23/2012  57 y.o. year old male with PMH of PVD s/p bypass and toe ampuation, CKD5   Not currently on dialysis   HTN, DM2, and tobacco abuse presenting with new onset dyspnea last night. CXR consistent with pulmonary edema.     Past Medical History  Diagnosis Date  . COPD (chronic obstructive pulmonary disease)   . Chronic back pain     Chronic narcotics for this for many years  . Diabetes mellitus   . Hyperlipidemia   . Hypertension   . Foot pain   . Peripheral vascular disease   . Asthma   . Stroke     No residual "Mini strokes"  . Ulcer 1985  . Fracture of foot     Comp[place]- Right  . Chronic kidney disease     CKD stage III  . Neuromuscular disorder     periferal neuropathy    Past Surgical History  Procedure Date  . Neck surgery   . Eye surgery   . Cardiac catheterization   . Endarterectomy 12/18/2011    Procedure: ENDARTERECTOMY CAROTID;  Surgeon: Chuck Hint, MD;  Location: [facility] OR;  Service: Vascular;  Laterality: Left;  . Carotid endarterectomy   . Axillary-femoral bypass graft 06/18/2012    Procedure: BYPASS GRAFT AXILLA-BIFEMORAL;  Surgeon: Chuck Hint, MD;  Location: [facility] OR;  Service: Vascular;  Laterality: Right;  Right Axillo to Right Femoral Bypass Graft  . Femoral-popliteal bypass graft 06/18/2012    Procedure: BYPASS GRAFT FEMORAL-POPLITEAL ARTERY;  Surgeon: Chuck Hint, MD;  Location: [facility] OR;  Service: Vascular;  Laterality: Right;  Right Femoral to  Above Knee Popliteal Bypass Graft  . Amputation 06/18/2012    Procedure: AMPUTATION DIGIT;  Surgeon: Chuck Hint, MD;  Location: [facility] OR;  Service: Vascular;  Laterality: Right;  Right side, amputation of fourth and fifth toes  . Av fistula placement 06/24/2012    Procedure: ARTERIOVENOUS (AV) FISTULA CREATION;  Surgeon: Chuck Hint, MD;  Location: [facility] OR;  Service: Vascular;  Laterality: Left;    Prior to Admission medications   Medication Sig Start Date End Date Taking? Authorizing Provider  amLODipine (NORVASC) 10 MG tablet Take 10 mg by mouth daily.   Yes Historical Provider, MD  aspirin 81 MG chewable tablet Chew 81 mg by mouth daily.   Yes Historical Provider, MD  baclofen (LIORESAL) 10 MG tablet Take 1 tablet (10 mg total) by mouth 2 (two) times daily. 03/03/12  Yes Dayarmys [place] Cris[place]da Peaches, MD  Calcium Citrate-Vitamin D (CITRACAL + D PO) Take 1 tablet by mouth daily.    Yes Historical Provider, MD  Furosemide (LASIX PO) Take 1 tablet by mouth 2 (two) times daily.   Yes Historical Provider, MD  insulin lispro (HUMALOG) 100 UNIT/ML injection Inject 5 Units into the skin 2 (two) times daily before lunch and supper. Per sliding scale. 08/13/10  Yes Sanjuana Letters, MD  ketoconazole (NIZORAL) 2 % cream Apply topically daily. To feet 05/31/12  Yes Latrelle Dodrill, MD  metoprolol succinate (TOPROL-XL) 25 MG 24 hr tablet Take 1 tablet (25 mg total) by mouth daily. 12/08/11 12/07/12 Yes Ardyth Gal, MD  pravastatin (PRAVACHOL) 40 MG tablet Take 1 tablet (40 mg total) by mouth daily. 03/10/12  Yes Dayarmys [place] Peaches, MD  sodium polystyrene (KAYEXALATE) 15 GM/60ML suspension Take 15 g by mouth See admin instructions. Takes every 3-4 days   Yes Historical Provider, MD  ciprofloxacin (CIPRO) 500 MG tablet Take 1 tablet (500 mg total) by mouth daily with breakfast. 06/24/12   Elenora Gamma, MD  darbepoetin (ARANESP) 100 MCG/0.5ML SOLN Inject 0.5 mLs (100 mcg total)  into the skin every Monday. 06/24/12   Elenora Gamma, MD  insulin glargine (LANTUS) 100 UNIT/ML injection Inject 20 Units into the skin daily. 06/24/12   Elenora Gamma, MD  nicotine (NICODERM CQ - DOSED IN MG/24 HR) 7 mg/24hr patch Place 1 patch onto the skin daily. 06/24/12   Elenora Gamma, MD  oxyCODONE (OXY IR/ROXICODONE) 5 MG immediate release tablet Take 1-2 tablets (5-10 mg total) by mouth every 3 (three) hours as needed. 06/24/12   Elenora Gamma, MD  oxyCODONE-acetaminophen (PERCOCET) 7.5-325 MG per tablet Take 1 tablet by mouth every 6 (six) hours as needed for pain (after Jan 20, (increased dose before that- seperate Rx). 06/24/12   Elenora Gamma, MD  simvastatin (ZOCOR) 80 MG tablet Take 80 mg by mouth at bedtime.    Historical Provider, MD  sodium bicarbonate 650 MG tablet Take 1 tablet (650 mg total) by mouth 2 (two) times daily. 06/24/12   Elenora Gamma, MD  Tamsulosin HCl (FLOMAX) 0.4 MG CAPS Take 2 capsules (0.8 mg total) by mouth daily. 06/24/12   Elenora Gamma, MD  traZODone (DESYREL) 50 MG tablet Take 1 tablet (50 mg total) by mouth at bedtime as needed for sleep. 06/24/12   Elenora Gamma, MD    Current Facility-Administered Medications  Medication Dose Route Frequency Provider Last Rate Last Dose  . 0.9 %  sodium chloride infusion  250 mL Intravenous PRN Elenora Gamma, MD      . acetaminophen (TYLENOL) tablet 650 mg  650 mg Oral Q4H PRN Elenora Gamma, MD      . aspirin chewable tablet 81 mg  81 mg Oral Daily Elenora Gamma, MD      . atorvastatin (LIPITOR) tablet 40 mg  40 mg Oral q1800 Elenora Gamma, MD      . baclofen (LIORESAL) tablet 10 mg  10 mg Oral BID Elenora Gamma, MD      . ciprofloxacin (CIPRO) tablet 500 mg  500 mg Oral Q breakfast Elenora Gamma, MD      . darbepoetin [facility]) injection 100 mcg  100 mcg Subcutaneous Q Mon Elenora Gamma, MD      . enoxaparin (LOVENOX) injection 30 mg  30 mg Subcutaneous Q24H Elenora Gamma, MD      . furosemide (LASIX) injection 80 mg  80 mg Intravenous Q6H Elenora Gamma, MD      . insulin glargine (LANTUS) injection 20 Units  20 Units Subcutaneous Daily Elenora Gamma, MD      . nicotine (NICODERM CQ - dosed in mg/24 hr) patch 7 mg  7 mg Transdermal Daily Elenora Gamma, MD      . ondansetron [facility]) injection 4 mg  4 mg Intravenous Q6H PRN Elenora Gamma, MD      . oxyCODONE (  Oxy IR/ROXICODONE) immediate release tablet 5-10 mg  5-10 mg Oral Q6H PRN Elenora Gamma, MD      . sodium bicarbonate tablet 650 mg  650 mg Oral BID Elenora Gamma, MD      . sodium chloride 0.9 % injection 3 mL  3 mL Intravenous Q12H Elenora Gamma, MD      . sodium chloride 0.9 % injection 3 mL  3 mL Intravenous PRN Elenora Gamma, MD      . Tamsulosin HCl (FLOMAX) capsule 0.8 mg  0.8 mg Oral Daily Elenora Gamma, MD      . traZODone (DESYREL) tablet 50 mg  50 mg Oral QHS PRN Elenora Gamma, MD        Allergies as of 06/28/2012 - Review Complete 06/28/2012  Allergen Reaction Noted  . Gabapentin Other (See Comments) 06/15/2012  . Vicodin (hydrocodone-acetaminophen) Hives and Itching 08/31/2011    Family History  Problem Relation Age of Onset  . Diabetes Mother   . Heart disease Mother   . Hypertension Mother   . Heart attack Mother   . Diabetes Father   . Heart disease Father   . Hypertension Father   . Heart attack Father   . Other Father     bleeding problems  . Diabetes Brother   . Heart disease Brother     History   Social History  . Marital Status: Married    Spouse Name: N/A    Number of Children: N/A  . Years of Education: N/A   Occupational History  . Not on file.   Social History Main Topics  . Smoking status: Current Every Day Smoker -- 0.5 packs/day for 40 years    Types: Cigarettes  . Smokeless tobacco: Never Used     Comment: pt states that he is trying to quit  . Alcohol Use: No     Comment: Previous drinker - However,  quit in 2006  . Drug Use: No  . Sexually Active: No   Other Topics Concern  . Not on file   Social History Narrative  . No narrative on file    Review of Systems: Gen: Denies any fever, chills, sweats, anorexia,+ fatigue, + weakness,+ malaise, weight loss, and sleep disorder  HEENT: Retinopathy diminished.  CV: Denies chest pain, angina, palpitations, syncope, orthopnea, PND, peripheral edema, and claudication.Atherosclerosis of native arteries of the extremities with intermittent claudication. Right carotid bruit  Resp: dyspnea at rest, dyspnea with exercise, cough, sputum, wheezing, coughing up blood, and pleurisy.  GI: Denies vomiting blood, jaundice, and fecal incontinence. Denies dysphagia or odynophagia.  GU :no urinary complaints.  MS: Denies joint pain, limitation of movement, and swelling, stiffness, low back pain, extremity pain. Denies muscle weakness, cramps, atrophy. No use of non steroidal antiinflammatory drugs.  Derm: Denies rash, itching, dry skin, hives, moles, warts, or unhealing ulcers.  Psych: Denies depression, anxiety, memory loss, suicidal ideation, hallucinations, paranoia, and confusion.  Heme: Denies bruising, bleeding, and enlarged lymph nodes.  Neuro: No headache. No diplopia. No dysarthria. No dysphasia. No history of CVA. No Seizures. No paresthesias. No weakness.  Endocrine DM. No Thyroid disease. No Adrenal disease.   Physical Exam: Vital signs in last 24 hours: Temp:  [98.8 F (37.1 C)-98.9 F (37.2 C)] 98.8 F (37.1 C) (01/20 2022) Pulse Rate:  [90-96] 90  (01/20 2022) Resp:  [15-23] 20  (01/20 2022) BP: (108-127)/(49-77) 108/71 mmHg (01/20 2022) SpO2:  [92 %-100 %] 92 % (01/20 2022)  Weight:  [88.587 kg (195 lb 4.8 oz)-90.719 kg (200 lb)] 88.587 kg (195 lb 4.8 oz) (01/20 2022)   General:   Alert,  Well-developed, well-nourished, pleasant and cooperative in NAD Head:  Normocephalic and atraumatic. Eyes:  Sclera clear, no icterus.   Conjunctiva  pink. Ears:  Normal auditory acuity. Nose:  No deformity, discharge,  or lesions. Mouth:  No deformity or lesions, dentition normal. Neck:  Supple; no masses or thyromegaly. JVP not elevated Lungs: Crackles at bilateral bases 95% 2 L Heart:  Regular rate and rhythm; no murmurs, clicks, rubs,  or gallops. Abdomen:  Soft, nontender and nondistended. No masses, hepatosplenomegaly or hernias noted. Normal bowel sounds, without guarding, and without rebound.   Msk:  Symmetrical without gross deformities. Normal posture. Pulses:  No carotid, renal, femoral bruits. DP and PT symmetrical and equal Extremities:  2+ Edema R >L, R foot with clean dry intact bandage covering recent surgical site of removal 4tha nd 5th digit without surrounding erythema or induration.  - Heme crusting under R great toe  - tenderness to palpation of on the medial side of his L great toe and just inferior to it on his foot  Left AVF wound clean and dry Neurologic:  Alert and  oriented x4;  grossly normal neurologically. Skin:  Intact without significant lesions or rashes. Cervical Nodes:  No significant cervical adenopathy. Psych:  Alert and cooperative. Normal mood and affect.  Intake/Output from previous day:   Intake/Output this shift:    Lab Results:  Basename 06/28/12 1523 06/28/12 1328  WBC 13.8* 13.1*  HGB 8.1* 7.4*  HCT 25.3* 23.7*  PLT 245 231   BMET  Basename 06/28/12 1523 06/28/12 1328  NA 139 139  K 4.7 4.9  CL 102 102  CO2 19 19  GLUCOSE 139* 155*  BUN 92* 92*  CREATININE 5.43* 5.46*  CALCIUM 8.3* 8.3*  PHOS -- --   LFT  Basename 06/28/12 1328  PROT 6.8  ALBUMIN 2.8*  AST 19  ALT 17  ALKPHOS 200*  BILITOT 0.3  BILIDIR --  IBILI --   PT/INR  Basename 06/28/12 1523  LABPROT 14.6  INR 1.16   Hepatitis Panel No results found for this basename: HEPBSAG,HCVAB,HEPAIGM,HEPBIGM in the last 72 hours  Studies/Results: Dg Chest 2 View  06/28/2012  *RADIOLOGY REPORT*  Clinical  Data: Shortness of breath.  CHEST - 2 VIEW  Comparison: PA and lateral chest 12/10/2011 and 09/12/2010.  Findings: There is cardiomegaly.  New interstitial prominence is likely due to mild edema.  No consolidative process, pneumothorax or effusion is identified.  IMPRESSION: Cardiomegaly and mild interstitial prominence likely due to edema.   Original Report Authenticated By: Holley Dexter, M.D.     Assessment/Plan:  Chronic renal Failure  Stage 5. Unfortunately admitted with worsening pulmonary edema and shortness of breath. Patient will most likely need dialysis in near future. There may not be time for the AVF to be ready.  HTN / VOL   IV diuresis  Anemia  Hb 8.1  Will check [employer]. However may need transfusion  Will assess patients volume status in the morning , if he is diuresing well consider transfusion. If patient does not diurese on increasing doses of lasix then proceed with dialysis and place perm cath.   LOS: 0 [place] W @TODAY @9 :12 PM

## 2012-06-28 NOTE — H&P (Signed)
John [name] Service Pager: [phone]  Patient name: John [name] Medical record number: [id number] Date of birth: 08-13-1955 Age: 57 y.o. Gender: male  Primary Care Provider: [name], MD  Chief Complaint: dyspnea, low BP  Assessment and Plan: MELBURN TREIBER is a 57 y.o. year old male with PMH of PVD s/p bypass and toe ampuation, CKD 4, HTN, DM2, and tobacco abuse presenting with new onset dyspnea last night and hypotension this am. WIth a BNP of 33k, pulmonary vascular congestion on CXR,a nd BL crackles on pulmonary exam he appears to have new onset heart failure. Although with his acute on chronic kidney disease it's possible that he is fluid overloaded from poor renal function. CXR is not concerning for consolidation and he denies fever so pneumonia is not as likely. Notably he does have COPD and this is likely contributing to his current clinical picture, but without increased cough and sputum it is hard to say to what degree it is contributing.   Dyspnea - Possibly new onset heart failure vs fluid overload with likely contribution of underlying COPD. - Stable on 2 L via [place], normotensive now - BNP 33K, EKG without ischemic changes, CXR with pulmonary  - Cycle troponins  - IV lasix 80 q6 as explained below - PRN duonebs - Daily weights and strict I/O   AKI on CKD4 - Creatine baseline previously 3 to4, discharged on 06/24/2012 with Cre of 4.31 - Cre today 5.43 - recently placed AV fistula recently - likely not mature - Consult Nephrology in the Am, Spoke with Dr. Hyman Hopes who reccommended 80 Lasix Q6 overnight- appreciate his help.  - BMP in the am, strict I/O as above - Some metabolic acidosis previously and placed on sodium bicarb at that time- continue  PVD - Amputation and bypass surgery during recent admission  - Complaints of bleeding from R great toe, wound appears well without signs of infection - Complaints of L foot  pain also - consider vascular consult to evaluate wound progress  - On Cipro renally dosed until 06/30/12  Anemia - Hgb 8.1 today - Likely anemia of chronic renal disease - On arinesp - monitor closely, low threshold for transfusion as we hav epossible cardiac source but will need to give concomitant Lasix if warranted  Prostatic hyperplasia - Prostate found to be large on last admsiion with post void residuals of 1L X2 - Coude catheter in place, continue - Continue flomax  DM2 - Home dose lantus, hold novalog - SSI, monitor CBGs  HTN - Hypotensive today at SNF, 110-127/69-77 today - Hold home BB and amlodipine   Smoking Cessation - from previous admission patient wants to quit, continue nicotine patch   FEN/GI: Carb modified, SLIV Prophylaxis: Lovenox Disposition: Tele, home vs SNF when respiratory status improves Code Status: Full  History of Present Illness: John [name] is a 57 y.o. year old male presenting with dyspnea that started last night suddenly. He also says that this morning at the nursing home they checked his BP and it was 80/40. He's also had progressive LE edema for the last couple of days. He denies orthopnea and cough, fever, chills, sweats, diarrhea, dysuria, and new back pain. He denies chest, jaw, or L arm pain, and states that the dyspnea came on suddenly without cause while he was lying in bed. He has been at a SNF recently and states that he did not have problems breathing when he was walking two  days ago. He states that he has been taking all of his meds lately since being at [facility] but has not had any BP meds after his low blood pressure this am.   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 He and his wife mention that he has been bleeding from under his R great toenail for about 2 days and that he is having more pain on his L foot.   Patient Active Problem List  Diagnosis  .  NEUROPATHY, DIABETIC  . DIABETES MELLITUS, II, COMPLICATIONS  . HYPERLIPIDEMIA  . OVERWEIGHT  . TOBACCO DEPENDENCE  . HYPERTENSION, BENIGN SYSTEMIC  . CLAUDICATION, INTERMITTENT  . CHRONIC KIDNEY DISEASE STAGE III (MODERATE)  . IMPOTENCE, ORGANIC  . OSTEOARTHRITIS, LOWER LEG  . BACK PAIN, CHRONIC  . GAIT, ABNORMAL  . CEREBROVASCULAR ACCIDENT, HX OF  . HYPERKALEMIA  . COPD (chronic obstructive pulmonary disease)  . Knee pain, left  . Foot pain, right  . Atherosclerosis of native arteries of the extremities with intermittent claudication  . Right carotid bruit  . Occlusion and stenosis of carotid artery without mention of cerebral infarction  . Cervical myelopathy  . Hyperkalemia  . Cellulitis of right foot  . Atherosclerotic peripheral vascular disease with gangrene   Past Medical History: Past Medical History  Diagnosis Date  . COPD (chronic obstructive pulmonary disease)   . Chronic back pain     Chronic narcotics for this for many years  . Diabetes mellitus   . Hyperlipidemia   . Hypertension   . Foot pain   . Peripheral vascular disease   . Asthma   . Stroke     No residual "Mini strokes"  . Ulcer 1985  . Fracture of foot     [place]- Right  . Chronic kidney disease     CKD stage III  . Neuromuscular disorder     periferal neuropathy   Past Surgical History: Past Surgical History  Procedure Date  . Neck surgery   . Eye surgery   . Cardiac catheterization   . Endarterectomy 12/18/2011    Procedure: ENDARTERECTOMY CAROTID;  Surgeon: Chuck Hint, MD;  Location: [facility] OR;  Service: Vascular;  Laterality: Left;  . Carotid endarterectomy   . Axillary-femoral bypass graft 06/18/2012    Procedure: BYPASS GRAFT AXILLA-BIFEMORAL;  Surgeon: Chuck Hint, MD;  Location: [facility] OR;  Service: Vascular;  Laterality: Right;  Right Axillo to Right Femoral Bypass Graft  . Femoral-popliteal bypass graft 06/18/2012    Procedure: BYPASS GRAFT FEMORAL-POPLITEAL  ARTERY;  Surgeon: Chuck Hint, MD;  Location: [facility] OR;  Service: Vascular;  Laterality: Right;  Right Femoral to Above Knee Popliteal Bypass Graft  . Amputation 06/18/2012    Procedure: AMPUTATION DIGIT;  Surgeon: Chuck Hint, MD;  Location: [facility] OR;  Service: Vascular;  Laterality: Right;  Right side, amputation of fourth and fifth toes  . Av fistula placement 06/24/2012    Procedure: ARTERIOVENOUS (AV) FISTULA CREATION;  Surgeon: Chuck Hint, MD;  Location: [facility] OR;  Service: Vascular;  Laterality: Left;   Social History: History  Substance Use Topics  . Smoking status: Current Every Day Smoker -- 0.5 packs/day for 40 years    Types: Cigarettes  . Smokeless tobacco: Never Used     Comment: pt states that he is trying to quit  . Alcohol Use: No     Comment: Previous drinker - However, quit in 2006  For any additional social history documentation, please refer to relevant sections of EMR.  John History: John History  Problem Relation Age of Onset  . Diabetes Mother   . Heart disease Mother   . Hypertension Mother   . Heart attack Mother   . Diabetes Father   . Heart disease Father   . Hypertension Father   . Heart attack Father   . Other Father     bleeding problems  . Diabetes Brother   . Heart disease Brother    Allergies: Allergies  Allergen Reactions  . Gabapentin Other (See Comments)    Can't walk, shuts legs down  . Vicodin (Hydrocodone-Acetaminophen) Hives and Itching   No current facility-administered medications on file prior to encounter.   Current Outpatient Prescriptions on File Prior to Encounter  Medication Sig Dispense Refill  . amLODipine (NORVASC) 10 MG tablet Take 10 mg by mouth daily.      Marland Kitchen aspirin 81 MG chewable tablet Chew 81 mg by mouth daily.      . baclofen (LIORESAL) 10 MG tablet Take 1 tablet (10 mg total) by mouth 2 (two) times daily.  30 each  1  . Calcium Citrate-Vitamin D (CITRACAL + D PO) Take 1 tablet by mouth  daily.       . Furosemide (LASIX PO) Take 1 tablet by mouth 2 (two) times daily.      . insulin lispro (HUMALOG) 100 UNIT/ML injection Inject 5 Units into the skin 2 (two) times daily before lunch and supper. Per sliding scale.      Marland Kitchen ketoconazole (NIZORAL) 2 % cream Apply topically daily. To feet  15 g  0  . metoprolol succinate (TOPROL-XL) 25 MG 24 hr tablet Take 1 tablet (25 mg total) by mouth daily.  30 tablet  11  . pravastatin (PRAVACHOL) 40 MG tablet Take 1 tablet (40 mg total) by mouth daily.  90 tablet  4  . sodium polystyrene (KAYEXALATE) 15 GM/60ML suspension Take 15 g by mouth See admin instructions. Takes every 3-4 days      . ciprofloxacin (CIPRO) 500 MG tablet Take 1 tablet (500 mg total) by mouth daily with breakfast.  6 tablet  0  . darbepoetin (ARANESP) 100 MCG/0.5ML SOLN Inject 0.5 mLs (100 mcg total) into the skin every Monday.  4.2 mL  0  . insulin glargine (LANTUS) 100 UNIT/ML injection Inject 20 Units into the skin daily.  10 mL  0  . nicotine (NICODERM CQ - DOSED IN MG/24 HR) 7 mg/24hr patch Place 1 patch onto the skin daily.  28 patch  0  . oxyCODONE (OXY IR/ROXICODONE) 5 MG immediate release tablet Take 1-2 tablets (5-10 mg total) by mouth every 3 (three) hours as needed.  40 tablet  0  . oxyCODONE-acetaminophen (PERCOCET) 7.5-325 MG per tablet Take 1 tablet by mouth every 6 (six) hours as needed for pain (after Jan 20, (increased dose before that- seperate Rx).  120 tablet  0  . sodium bicarbonate 650 MG tablet Take 1 tablet (650 mg total) by mouth 2 (two) times daily.  60 tablet  0  . Tamsulosin HCl (FLOMAX) 0.4 MG CAPS Take 2 capsules (0.8 mg total) by mouth daily.  60 capsule  0  . traZODone (DESYREL) 50 MG tablet Take 1 tablet (50 mg total) by mouth at bedtime as needed for sleep.  30 tablet  0  . [DISCONTINUED] tiotropium (SPIRIVA HANDIHALER) 18 MCG inhalation capsule Place 1 capsule (18 mcg total) into inhaler  and inhale daily.  30 capsule  2   Review Of Systems:  Per HPI, otherwise 12 point review of systems was performed and was unremarkable.  [name] Exam: BP 123/49  Pulse 96  Temp 98.9 F (37.2 C) (Oral)  Resp 23  Ht 5\' 11"  (1.803 m)  Wt 200 lb (90.719 kg)  BMI 27.89 kg/m2  SpO2 92% Exam: Gen: NAD, alert, cooperative with exam HEENT: NCAT, PERRL CV: RRR, good S1/S2, no murmur Resp: Crackles at the BL bases, non labored, satting 95+% on 2 L via [place].  Abd: SNTND, BS present, no guarding or organomegaly Ext: 2+ Edema R >L, R foot with clean dry intact bandage covering recent surgical site of removal 4tha nd 5th digit without surrounding erythema or induration.   - Heme crusting under R great toe  - tenderness to palpation of on the medial side of his L great toe and just inferior to it on his foot Neuro: Alert and oriented, No gross deficits  Labs and Imaging: CBC BMET   Lab 06/28/12 1523  WBC 13.8*  HGB 8.1*  HCT 25.3*  PLT 245    Lab 06/28/12 1523  NA 139  K 4.7  CL 102  CO2 19  BUN 92*  CREATININE 5.43*  GLUCOSE 139*  CALCIUM 8.3*     BNP 33516  CXR 06/28/2012 IMPRESSION:  Cardiomegaly and mild interstitial prominence likely due to edema.   Kevin Fenton, MD 06/28/2012, 4:45 PM  Teaching Service Addendum.  I have seen and evaluated this pt and agree with Dr. Felipa Emory assessment and plan documented in this H&P  D. [place] Rolene Arbour, MD [facility]  PGY-2

## 2012-06-28 NOTE — Progress Notes (Signed)
There is a left lower extremity venous duplex ordered with no documented indication. Patient is having no left lower extremity symptoms. Heather, PA who ordered study has left for the day. Spoke with RN Debbe Bales, she is unaware of an indication for this test and has advised that we follow up in the morning once the patient has been admitted to determine necessity of this exam.  06/28/2012 6:35 PM Gertie Fey, RDMS, RDCS

## 2012-06-28 NOTE — ED Notes (Signed)
Pt transported to xray 

## 2012-06-29 ENCOUNTER — Encounter: Payer: Self-pay | Admitting: Vascular Surgery

## 2012-06-29 DIAGNOSIS — I5023 Acute on chronic systolic (congestive) heart failure: Secondary | ICD-10-CM

## 2012-06-29 DIAGNOSIS — J441 Chronic obstructive pulmonary disease with (acute) exacerbation: Secondary | ICD-10-CM

## 2012-06-29 DIAGNOSIS — N320 Bladder-neck obstruction: Secondary | ICD-10-CM

## 2012-06-29 DIAGNOSIS — N186 End stage renal disease: Secondary | ICD-10-CM

## 2012-06-29 LAB — BASIC METABOLIC PANEL
BUN: 97 mg/dL — ABNORMAL HIGH (ref 6–23)
Calcium: 8.2 mg/dL — ABNORMAL LOW (ref 8.4–10.5)
Creatinine, Ser: 5.9 mg/dL — ABNORMAL HIGH (ref 0.50–1.35)
GFR calc non Af Amer: 10 mL/min — ABNORMAL LOW (ref >90)
Glucose, Bld: 91 mg/dL (ref 70–99)

## 2012-06-29 LAB — CBC
HCT: 22.6 % — ABNORMAL LOW (ref 39.0–52.0)
HCT: 24.5 % — ABNORMAL LOW (ref 39.0–52.0)
Hemoglobin: 7.9 g/dL — ABNORMAL LOW (ref 13.0–17.0)
MCHC: 31.4 g/dL (ref 30.0–36.0)
MCHC: 32.2 g/dL (ref 30.0–36.0)
MCV: 90.8 fL (ref 78.0–100.0)
RDW: 14.9 % (ref 11.5–15.5)
WBC: 13.6 10*3/uL — ABNORMAL HIGH (ref 4.0–10.5)

## 2012-06-29 LAB — IRON AND TIBC

## 2012-06-29 LAB — PREPARE RBC (CROSSMATCH)

## 2012-06-29 LAB — TROPONIN I: Troponin I: <0.3 ng/mL (ref <0.30)

## 2012-06-29 LAB — GLUCOSE, CAPILLARY: Glucose-Capillary: 139 mg/dL — ABNORMAL HIGH (ref 70–99)

## 2012-06-29 MED ORDER — INSULIN GLARGINE 100 UNIT/ML ~~LOC~~ SOLN
5.0000 [IU] | Freq: Every day | SUBCUTANEOUS | Status: DC
Start: 2012-06-29 — End: 2012-07-02
  Administered 2012-06-29 – 2012-07-01 (×3): 5 [IU] via SUBCUTANEOUS

## 2012-06-29 MED ORDER — INSULIN GLARGINE 100 UNIT/ML ~~LOC~~ SOLN: 5.0000 [IU] | Freq: Every day | SUBCUTANEOUS | Status: DC

## 2012-06-29 MED ORDER — INSULIN ASPART 100 UNIT/ML ~~LOC~~ SOLN
0.0000 [IU] | Freq: Three times a day (TID) | SUBCUTANEOUS | Status: DC
  Administered 2012-06-29 – 2012-07-01 (×4): 1 [IU] via SUBCUTANEOUS
  Administered 2012-07-01: 2 [IU] via SUBCUTANEOUS
  Administered 2012-07-01: 3 [IU] via SUBCUTANEOUS
  Administered 2012-07-02: 1 [IU] via SUBCUTANEOUS
  Administered 2012-07-02: 2 [IU] via SUBCUTANEOUS
  Administered 2012-07-03: 1 [IU] via SUBCUTANEOUS
  Administered 2012-07-03: 2 [IU] via SUBCUTANEOUS

## 2012-06-29 MED ORDER — SODIUM CHLORIDE 0.9 % IV SOLN
1020.0000 mg | Freq: Once | INTRAVENOUS | Status: AC
  Administered 2012-06-29: 1020 mg via INTRAVENOUS
  Filled 2012-06-29: qty 34

## 2012-06-29 MED ORDER — IPRATROPIUM BROMIDE 0.02 % IN SOLN: 0.5000 mg | Freq: Four times a day (QID) | RESPIRATORY_TRACT | Status: DC | PRN

## 2012-06-29 MED ORDER — INSULIN ASPART 100 UNIT/ML ~~LOC~~ SOLN
0.0000 [IU] | Freq: Every day | SUBCUTANEOUS | Status: DC
  Administered 2012-07-02: 2 [IU] via SUBCUTANEOUS

## 2012-06-29 MED ORDER — ALBUTEROL SULFATE (5 MG/ML) 0.5% IN NEBU: 2.5000 mg | INHALATION_SOLUTION | Freq: Four times a day (QID) | RESPIRATORY_TRACT | Status: DC | PRN

## 2012-06-29 NOTE — ED Provider Notes (Signed)
Medical screening examination/treatment/procedure(s) were performed by non-physician practitioner and as supervising physician I was immediately available for consultation/collaboration.   Antwoine Zorn, MD 06/29/12 1524 

## 2012-06-29 NOTE — Progress Notes (Signed)
Patient's hemoglobin is 7.1.  MD notified.

## 2012-06-29 NOTE — Progress Notes (Addendum)
Patient arrived to 8.  Patient oriented to the unit and placed on the telemetry monitor.  Call bell explained and placed within reach.  Patient's wife is at the bedside.

## 2012-06-29 NOTE — Consult Note (Addendum)
WOC consult Note Reason for Consult: Consult requested for right foot wound.  Pt had recent amputation to this site and surgery was performed by Dr Edilia Bo on 1/15.  Pt states he ordered wet to dry dressings BID post-op when he was discharged, and he was due to see the VVS surgeon tomorrow for his post-op appointment.  Wound type: Post-op full thickness Measurement:3X2X,3cm Wound bed: 50% yellow slough, 50% red. Drainage (amount, consistency, odor) Mod yellow drainage, no odor. Periwound: Intact skin surrounding. Dressing procedure/placement/frequency: Continue moist gauze BID as was previously ordered by VVS service.  Pt could benefit from assessment by this team for further plan of care during his hospitalization since he will miss his scheduled apt due to admission.  Left heel with 4X3cm deep tissue injury, present on admission. Right heel with 2X2cm deep tissue injury, present on admission. Left great toe tip with eschar developing to area 1X1cm Right great toe tip with eschar developing 1.5X1.5 Previous ABI was not able to be obtained to right foot, .34 to left foot which is inadequate. Currently there is no odor, drainage, or fluctuance to any of these areas.  No topical treatment needed to these sites at this time; all appear to be the result of vascular insufficiency;  float heels to reduce pressure.  Will not plan to follow further unless re-consulted.  [address] RN, MSN, [employer]  [phone]

## 2012-06-29 NOTE — Progress Notes (Signed)
[facility]  Subjective:  Feels like shortness of breath is improved. No chest pain. No nausea, no vomiting.    Objective: Vital signs in last 24 hours: Blood pressure 114/78, pulse 93, temperature 98.3 F (36.8 C), temperature source Oral, resp. rate 20, height 5\' 11"  (1.803 m), weight 195 lb 8.8 oz (88.7 kg), SpO2 94.00%.    PHYSICAL EXAM Gen: NAD, sitting on edge of bed comfortably Chest: bibasilar crackles bilaterally Cardio: regular rate and rhythm, S1, S2 normal, no murmur GI: soft, non-tender; distended Extremities: +2 pitting up to knee bilaterally, trace up to thigh. Left AV fistula: palpable thrill + bruit Neurologic: No focal deficit  Lab Results:   Lab 06/29/12 0806 06/28/12 2044 06/28/12 1523 06/28/12 1328 06/24/12 0555 06/23/12 0515  NA 142 -- 139 139 -- --  K 4.8 -- 4.7 4.9 -- --  CL 104 -- 102 102 -- --  CO2 20 -- 19 19 -- --  BUN 97* -- 92* 92* -- --  CREATININE 5.90* 5.69* 5.43* -- -- --  ALB -- -- -- -- -- --  GLUCOSE 91 -- -- -- -- --  CALCIUM 8.2* -- 8.3* 8.3* -- --  PHOS -- -- -- -- 5.7* 5.9*     Basename 06/29/12 0806 06/28/12 2044  WBC 13.2* 14.0*  HGB 7.1* 7.1*  HCT 22.6* 22.8*  PLT 258 239     Scheduled Meds:   . aspirin  81 mg Oral Daily  . atorvastatin  40 mg Oral q1800  . baclofen  10 mg Oral BID  . ciprofloxacin  500 mg Oral Q breakfast  . darbepoetin  100 mcg Subcutaneous Q Mon  . enoxaparin (LOVENOX) injection  30 mg Subcutaneous Q24H  . furosemide  80 mg Intravenous Q6H  . insulin aspart  0-5 Units Subcutaneous QHS  . insulin aspart  0-9 Units Subcutaneous TID WC  . insulin glargine  5 Units Subcutaneous Daily  . nicotine  7 mg Transdermal Daily  . sodium bicarbonate  650 mg Oral BID  . sodium chloride  3 mL Intravenous Q12H  . Tamsulosin HCl  0.8 mg Oral Daily   Continuous Infusions:  PRN Meds:.sodium chloride, acetaminophen, albuterol, ipratropium, ondansetron (ZOFRAN) IV, oxyCODONE, sodium chloride,  traZODone  Assessment/Plan: John [name] is a 57 y.o. year old male with PMH of PVD s/p bypass and toe ampuation, CKD 4, HTN, DM2, and tobacco abuse presenting with new onset dyspnea and hypotension while at [facility]  # CKD4: creatinine of 3.4 in October 2013. Creatinine on d/c from hospital on 1/16 was 4.31. Currently 5.90. Has diuresed well on lasix 80 IV q6hr. Still has some evidence of fluid overload on exam.   # Anemia: iron <10 Hemoglobin today of 7.1.  - IV iron now.  - on aranesp .  # Fluid overload: CHF vs CKD. Echo pending.   # DM2: per primary # HTN: BP soft. BP meds held.    LOS: 1 day   LOSQ, STEPHANIE 06/29/2012,1:13 PM I have seen and examined this patient and agree with plan per Dr Gwenlyn Saran. Cont with IV lasix.  Will give IV iron.  Check PTH. Note plans for transfusion.  Echo pending.  Follow renal fx closely.  He is not uremic but time will tell if will need HD this admission. Lavi Sheehan T,MD 06/29/2012 1:44 PM

## 2012-06-29 NOTE — Progress Notes (Signed)
I discussed the care plan with Dr Ermalinda Memos and the [facility] team and agree with assessment and plan as documented in the progress note above.

## 2012-06-29 NOTE — Care Management Note (Signed)
    Page 1 of 1   06/29/2012     1:23:04 PM   CARE MANAGEMENT NOTE 06/29/2012  Patient:  John [name], John [name]   Account Number:  [id number]  Date Initiated:  06/29/2012  Documentation initiated by:  Tera Mater  Subjective/Objective Assessment:   56yo male admitted from [facility] SNF with CHF.     Action/Plan:   Pt. and spouse do not want pt. to return to SNF, spouse wants to take pt. back home.  Spouse would like [facility] for [facility] services and if possible, would like a wheelchair for pt.   Anticipated DC Date:  07/02/2012   Anticipated DC Plan:  HOME W HOME HEALTH SERVICES  In-house referral  Clinical Social Worker      DC Associate Professor  CM consult      [facility] Choice  HOME HEALTH  DURABLE MEDICAL EQUIPMENT   Choice offered to / List presented to:  C-1 Patient           [facility] agency  [facility]   Status of service:  In process, will continue to follow Medicare Important Message given?   (If response is "NO", the following Medicare IM given date fields will be blank) Date Medicare IM given:   Date Additional Medicare IM given:    Discharge Disposition:    Per UR Regulation:  Reviewed for med. necessity/level of care/duration of stay  If discussed at Long Length of Stay Meetings, dates discussed:    Comments:  06/29/12 1300 Spouse wants to take pt. back home instead of SNF.  Pt. wants [facility].  Would recommend HHRN, HH PT/OT, HH aide for discharge.  Also, pt. would like a wheelchair.  NCM to follow for discharge needs Tera Mater, RN, BSN NCM [phone]

## 2012-06-29 NOTE — Progress Notes (Signed)
Patient admitted from [facility]. Met with patient and one of his daughters Porfirio Mylar today. Patient states that he will not be returning to facility at d/c; he plans to go home with his wife, support of family and home health.  Patient verbalized considerable concerns regarding care issues at facility that family will follow up on. Patient feels that he will recover better at home. Patient stated that he has a RW, and tub bench at home. He requests a wheelchair.  His wife is currently receiving [facility]; patient relates if HH is ordered he would like this service as well.  Above discussed with Ivonne Andrew, RNCM.  Notified Janie- Admissions at [facility] that patient would not be returning facility at d/c. Pt's daughter states that they have a large supportive family and will be able to provide for patient's care at home.  No further CSW needs identified;  CSW signing off.  Lorri Frederick. [place]  [phone]

## 2012-06-29 NOTE — Progress Notes (Signed)
Family Medicine Teaching Service Daily Progress Note Service Page: [phone]   Subjective:  Feeling about the same this morning, dyspnea unchanged. No chest pain. L foot pain improved, no leg pain.   Objective: Temp:  [98.1 F (36.7 C)-98.9 F (37.2 C)] 98.1 F (36.7 C) (01/21 0543) Pulse Rate:  [85-96] 93  (01/21 0543) Resp:  [15-23] 20  (01/21 0543) BP: (101-127)/(49-77) 104/60 mmHg (01/21 0543) SpO2:  [92 %-100 %] 94 % (01/21 0543) Weight:  [195 lb 4.8 oz (88.587 kg)-200 lb (90.719 kg)] 195 lb 8.8 oz (88.7 kg) (01/21 0543) Exam: Gen: NAD, alert, cooperative with exam  HEENT: NCAT, PERRL  CV: RRR, good S1/S2, no murmur  Resp: soft crackles improved from admission, non labored on 2 L via [place].  Abd: SNTND, BS present, no guarding or organomegaly  Ext: 2+ Edema R >L, R foot with clean dry intact bandage covering recent surgical site of removal 4tha nd 5th digit without surrounding erythema or induration.  - Heme crusting under R great toe  - tenderness to palpation of on the medial side of his L great toe and just inferior to it on his foot improved from admission Neuro: Alert and oriented, No gross deficits   I have reviewed the patient's medications, labs, imaging, and diagnostic testing.  Notable results are summarized below.  CBC BMET   Lab 06/28/12 2044 06/28/12 1523 06/28/12 1328  WBC 14.0* 13.8* 13.1*  HGB 7.1* 8.1* 7.4*  HCT 22.8* 25.3* 23.7*  PLT 239 245 231    Lab 06/28/12 2044 06/28/12 1523 06/28/12 1328 06/24/12 0555  NA -- 139 139 141  K -- 4.7 4.9 5.4*  CL -- 102 102 107  CO2 -- 19 19 18*  BUN -- 92* 92* 76*  CREATININE 5.69* 5.43* 5.46* --  GLUCOSE -- 139* 155* 103*  CALCIUM -- 8.3* 8.3* 8.7      06/28/2012 22:06  Iron <10 (L)  UIBC 178  TIBC Not calculated due to Iron <10.  Saturation Ratios Not calculated due to Iron <10.     Lab 06/29/12 0210 06/28/12 2044  TROPONINI <0.30 <0.30  D-Dimer 4.49    06/28/2012 19:12  Color, Urine YELLOW    APPearance HAZY (A)  Specific Gravity, Urine 1.018  pH 5.0  Glucose NEGATIVE  Bilirubin Urine NEGATIVE  Ketones, ur NEGATIVE  Protein 100 (A)  Urobilinogen, UA 0.2  Nitrite NEGATIVE  Leukocytes, UA MODERATE (A)  Hgb urine dipstick LARGE (A)  Urine-Other FEW YEAST  WBC, UA 7-10  RBC / HPF 7-10  Squamous Epithelial / LPF FEW (A)  Bacteria, UA FEW (A)    Imaging/Diagnostic Tests: CXR 06/28/2012  IMPRESSION:  Cardiomegaly and mild interstitial prominence likely due to edema.   Plan: John [name] is a 57 y.o. year old male with PMH of PVD s/p bypass and toe ampuation, CKD 4, HTN, DM2, and tobacco abuse presenting with new onset dyspnea and hypotension while at [facility]. We have him admitted to tele and are treating him for apparent new heart failure vs fluid overload form renal insufficieny.   Dyspnea  - Possibly new onset heart failure vs fluid overload with likely contribution of underlying COPD.  - Stable on 2 L via [place], normotensive now  - BNP 33K, EKG without ischemic changes, CXR with pulmonary  - Troponins negative times 2 - IV lasix 80 q6 as explained below - continue - PRN duonebs  - negative 1.2 L overnight  AKI on CKD4 - fluid overload -  Catheter in place so likely not obstructive, UA with mod LE and large blood- source of collection unknown as it is charted clean catch- likely collected from bag.   - Consider replacing catheter and UA from the urine collected then - Creatine baseline previously 3 to 4, discharged on 06/24/2012 with Cre of 4.31  - Cre pending this am  - recently placed AV fistula recently - likely not mature  - Nephrology consulting- appreciate recommendations  - IV lasix 80 mg Q6  - May need dialysis, will need tunneled cath if dialysis neccessary - BMP pending, strict I/O as above  - Some metabolic acidosis previously and placed on sodium bicarb at that time- continued  PVD  - Amputation and bypass surgery during recent admission  - Complaints of  bleeding from R great toe, wound on R  Previous amputation site appears well without signs of infection  - Complaints of L foot pain improved this am - consider vascular consult to evaluate wound progress  - On Cipro renally dosed until 06/30/12   Anemia  - Hgb 7.1 from 8.1 today today - possibly need transfusion - Likely anemia of chronic renal disease, also iron deficiency with iron less than 10 - On arinesp, consider IV iron vs PO iron - monitor closely, low threshold for transfusion as we hav epossible cardiac source but will need to give concomitant Lasix if warranted   Prostatic hyperplasia  - Prostate found to be large on last admsiion with post void residuals of 1L X2  - Coude catheter in place, continue  - Continue flomax   DM2  - Home dose lantus, hold novalog  - SSI, monitor CBGs   HTN  - Hypotensive today at SNF, 110-127/69-77 today  - Hold home BB and amlodipine   Smoking Cessation  - from previous admission patient wants to quit, continue nicotine patch   FEN/GI: Carb modified, SLIV  Prophylaxis: Lovenox  Disposition: Tele, home vs SNF when respiratory status improves  Code Status: Full  Kevin Fenton, MD 06/29/2012, 8:32 AM

## 2012-06-29 NOTE — Progress Notes (Signed)
MD contacted about patient's continued need for a Foley catheter.  Nurse instructed to not remove Foley due to urinary retention, recent surgery, and diuresis.

## 2012-06-29 NOTE — Plan of Care (Signed)
Problem: Food- and Nutrition-Related Knowledge Deficit (NB-1.1) Goal: Nutrition education Formal process to instruct or train a patient/client in a skill or to impart knowledge to help patients/clients voluntarily manage or modify food choices and eating behavior to maintain or improve health.  Outcome: Completed/Met Date Met:  06/29/12 Nutrition Education Note  RD consulted for Renal Education. Provided Choose-A-Meal Booklet to patient/family. Reviewed food groups and provided written recommended serving sizes specifically determined for patient's current nutritional status.   Explained why diet restrictions are needed and provided lists of foods to limit/avoid that are high potassium, sodium, and phosphorus. Provided specific recommendations on safer alternatives of these foods. Strongly encouraged compliance of this diet.   Discussed importance of protein intake at each meal and snack. Provided examples of how to maximize protein intake throughout the day. Discussed need for fluid restriction with dialysis, importance of minimizing weight gain between HD treatments, and renal-friendly beverage options.  Encouraged pt to discuss specific diet questions/concerns with RD at HD outpatient facility. Teach back method used.  Expect fair compliance.  Body mass index is 27.27 kg/(m^2). Pt meets criteria for Overweight  based on current BMI.  Current diet order is Renal 80-90, patient is consuming approximately 50% of meals at this time. Labs and medications reviewed. No further nutrition interventions warranted at this time. RD contact information provided. If additional nutrition issues arise, please re-consult RD.  John [name] RD, LDN Pager [phone] After Hours pager [phone]

## 2012-06-29 NOTE — Progress Notes (Signed)
06/29/12 1340 Noted new orders for [facility] RN, HH PT/OT, HH aide.  TC to Lupita Leash, with [facility] to make referral.  Anticipate pt. to be INPT for at least 2 more days.  Pt. may require dialysis this admission.  NCM will follow. Tera Mater, RN, BSN NCM [phone]

## 2012-06-29 NOTE — Progress Notes (Signed)
*  PRELIMINARY RESULTS* Echocardiogram 2D Echocardiogram has been performed.  John [name] 06/29/2012, 4:32 PM

## 2012-06-29 NOTE — Progress Notes (Signed)
Patient evaluated for long-term disease management services with [facility] Care Management Program. Patient will receive a post discharge transition of care call and monthly home visits for assessments and for education. Spoke with patient and family at bedside who are adamant about patient not going back to SNF but home with home health care. Patient lives with wife and has supportive daughters. Consents signed at bedside. Explained how [facility] Care Management will not replace home health services. Left [facility] Care Management packet and contact information at bedside. Appreciative of visit. Spoke with inpatient case manager regarding conversation.   Raiford Noble, MSN-Ed, RN,BSN [facility] Liaison [phone]

## 2012-06-29 NOTE — H&P (Signed)
Family Medicine Teaching Service Attending Note  I interviewed and examined patient John [name] and reviewed their tests and x-rays.  I discussed with Dr. Aviva Signs and reviewed their note for today.  I agree with their assessment and plan.     Additionally  Feeling improved - less shortness of breath and no chest pain Alert no acute distress Lung - basilar crackles bilateral Extrem - 1+ edema on left and 2+ on right No calf tenderness or cords  Treatment for CHF - check echo Follow Hgb closely transfuse if continues to drop or develops signs of ischemia - will need to ensure we can remove excess fluid either with iv diuretics or dialysis and give rbc slowly to not worsen CHF No evidence of dvt (had normal doppler on Sat by report) would not do VQ

## 2012-06-30 ENCOUNTER — Ambulatory Visit: Payer: Medicare Other | Admitting: Vascular Surgery

## 2012-06-30 LAB — RENAL FUNCTION PANEL
Albumin: 2.7 g/dL — ABNORMAL LOW (ref 3.5–5.2)
BUN: 105 mg/dL — ABNORMAL HIGH (ref 6–23)
Creatinine, Ser: 6.22 mg/dL — ABNORMAL HIGH (ref 0.50–1.35)
GFR calc non Af Amer: 9 mL/min — ABNORMAL LOW (ref >90)
Phosphorus: 7.8 mg/dL — ABNORMAL HIGH (ref 2.3–4.6)
Potassium: 4.7 mEq/L (ref 3.5–5.1)

## 2012-06-30 LAB — URINE CULTURE: Colony Count: NO GROWTH

## 2012-06-30 LAB — GLUCOSE, CAPILLARY
Glucose-Capillary: 124 mg/dL — ABNORMAL HIGH (ref 70–99)
Glucose-Capillary: 149 mg/dL — ABNORMAL HIGH (ref 70–99)

## 2012-06-30 LAB — CBC
Hemoglobin: 8.1 g/dL — ABNORMAL LOW (ref 13.0–17.0)
MCH: 28.3 pg (ref 26.0–34.0)
Platelets: 269 10*3/uL (ref 150–400)
RBC: 2.86 MIL/uL — ABNORMAL LOW (ref 4.22–5.81)
WBC: 12.2 10*3/uL — ABNORMAL HIGH (ref 4.0–10.5)

## 2012-06-30 LAB — TYPE AND SCREEN: Unit division: 0

## 2012-06-30 MED ORDER — IPRATROPIUM BROMIDE 0.02 % IN SOLN
0.5000 mg | Freq: Three times a day (TID) | RESPIRATORY_TRACT | Status: DC
  Administered 2012-06-30 – 2012-07-03 (×8): 0.5 mg via RESPIRATORY_TRACT
  Filled 2012-06-30 (×9): qty 2.5

## 2012-06-30 MED ORDER — IPRATROPIUM BROMIDE 0.02 % IN SOLN: 0.5000 mg | Freq: Three times a day (TID) | RESPIRATORY_TRACT | Status: DC

## 2012-06-30 MED ORDER — MUPIROCIN 2 % EX OINT
TOPICAL_OINTMENT | Freq: Two times a day (BID) | CUTANEOUS | Status: DC
  Administered 2012-06-30: 1 via TOPICAL
  Administered 2012-07-01 – 2012-07-03 (×5): via TOPICAL
  Filled 2012-06-30 (×2): qty 22

## 2012-06-30 MED ORDER — ALBUTEROL SULFATE (5 MG/ML) 0.5% IN NEBU: 2.5000 mg | INHALATION_SOLUTION | RESPIRATORY_TRACT | Status: DC

## 2012-06-30 MED ORDER — IPRATROPIUM BROMIDE 0.02 % IN SOLN
0.5000 mg | RESPIRATORY_TRACT | Status: DC
  Administered 2012-06-30: 0.5 mg via RESPIRATORY_TRACT
  Filled 2012-06-30: qty 2.5

## 2012-06-30 MED ORDER — ALBUTEROL SULFATE (5 MG/ML) 0.5% IN NEBU
2.5000 mg | INHALATION_SOLUTION | Freq: Three times a day (TID) | RESPIRATORY_TRACT | Status: DC
  Administered 2012-06-30 – 2012-07-03 (×8): 2.5 mg via RESPIRATORY_TRACT
  Filled 2012-06-30 (×9): qty 0.5

## 2012-06-30 MED ORDER — CALCIUM CARBONATE ANTACID 500 MG PO CHEW
2.0000 | CHEWABLE_TABLET | Freq: Three times a day (TID) | ORAL | Status: DC
  Administered 2012-06-30 – 2012-07-03 (×10): 400 mg via ORAL
  Filled 2012-06-30 (×12): qty 2

## 2012-06-30 MED ORDER — ALBUTEROL SULFATE (5 MG/ML) 0.5% IN NEBU
2.5000 mg | INHALATION_SOLUTION | RESPIRATORY_TRACT | Status: DC
  Administered 2012-06-30: 2.5 mg via RESPIRATORY_TRACT
  Filled 2012-06-30: qty 0.5

## 2012-06-30 NOTE — Progress Notes (Signed)
Family Medicine Teaching Service Daily Progress Note Service Page: [phone]   Subjective:  Breathing a little better, some wet cough. No sources of obvious bleed. Mild nosebleed yesterday, denies melena and hematochezia.   Objective: Temp:  [97.9 F (36.6 C)-99.9 F (37.7 C)] 97.9 F (36.6 C) (01/22 0656) Pulse Rate:  [88-99] 88  (01/22 0656) Resp:  [20] 20  (01/22 0656) BP: (104-129)/(69-81) 129/71 mmHg (01/22 0656) SpO2:  [80 %-97 %] 94 % (01/22 0656) Weight:  [198 lb 1.6 oz (89.858 kg)] 198 lb 1.6 oz (89.858 kg) (01/22 0656) Exam: Gen: NAD, alert, cooperative with exam  HEENT: NCAT, PERRL  CV: RRR, good S1/S2, no murmur  Resp: Crackles at BL bases, non labored on 2 L via [place].  Abd: SNTND, BS present, no guarding or organomegaly  Ext: 1+ Edema R >L, R foot with clean dry intact bandage covering recent surgical site of removal 4tha nd 5th digit w Neuro: Alert and oriented, No gross deficits   I have reviewed the patient's medications, labs, imaging, and diagnostic testing.  Notable results are summarized below.  CBC BMET   Lab 06/29/12 2020 06/29/12 0806 06/28/12 2044  WBC 13.6* 13.2* 14.0*  HGB 7.9* 7.1* 7.1*  HCT 24.5* 22.6* 22.8*  PLT 268 258 239    Lab 06/29/12 0806 06/28/12 2044 06/28/12 1523 06/28/12 1328  NA 142 -- 139 139  K 4.8 -- 4.7 4.9  CL 104 -- 102 102  CO2 20 -- 19 19  BUN 97* -- 92* 92*  CREATININE 5.90* 5.69* 5.43* --  GLUCOSE 91 -- 139* 155*  CALCIUM 8.2* -- 8.3* 8.3*      06/28/2012 22:06  Iron <10 (L)  UIBC 178  TIBC Not calculated due to Iron <10.  Saturation Ratios Not calculated due to Iron <10.      Lab 06/29/12 0806 06/29/12 0210 06/28/12 2044  TROPONINI <0.30 <0.30 <0.30  D-Dimer 4.49    06/28/2012 19:12  Color, Urine YELLOW  APPearance HAZY (A)  Specific Gravity, Urine 1.018  pH 5.0  Glucose NEGATIVE  Bilirubin Urine NEGATIVE  Ketones, ur NEGATIVE  Protein 100 (A)  Urobilinogen, UA 0.2  Nitrite NEGATIVE  Leukocytes, UA  MODERATE (A)  Hgb urine dipstick LARGE (A)  Urine-Other FEW YEAST  WBC, UA 7-10  RBC / HPF 7-10  Squamous Epithelial / LPF FEW (A)  Bacteria, UA FEW (A)    Imaging/Diagnostic Tests: CXR 06/28/2012  IMPRESSION:  Cardiomegaly and mild interstitial prominence likely due to edema.  2D TTE 06/29/2012 Study Conclusions  - Left ventricle: The cavity size was normal. There was mild concentric hypertrophy. Systolic function was mildly reduced. The estimated ejection fraction was in the range of 50% to 55%. Although no diagnostic regional wall motion abnormality was identified, this possibility cannot be completely excluded on the basis of this study. Features are consistent with a pseudonormal left ventricular filling pattern, with concomitant abnormal relaxation and increased filling pressure (grade 2 diastolic dysfunction). Doppler parameters are consistent with elevated mean left atrial filling pressure. - Mitral valve: Moderate to severe regurgitation directed centrally. - Left atrium: The atrium was moderately to severely dilated. - Atrial septum: No defect or patent foramen ovale was identified. - Pulmonary arteries: Systolic pressure was moderately to severely increased. PA peak pressure: 64mm Hg (S). - Pericardium, extracardiac: A small pericardial effusion was identified circumferential to the heart.   Plan: John [name] is a 57 y.o. year old male with PMH of PVD s/p bypass and toe  ampuation, CKD 4, HTN, DM2, and tobacco abuse presenting with new onset dyspnea and hypotension while at SNF. We have him admitted to tele and are treating him for apparent new heart failure vs fluid overload form renal insufficieny.   Dyspnea  - Possibly new onset heart failure vs fluid overload with likely contribution of underlying COPD.  - Stable on 2 L via [place], normotensive now  - BNP 33K, EKG without ischemic changes, CXR with pulmonary  - Troponins negative times 3 - IV lasix 80 q6 as  explained below - continue - PRN duonebs  - negative 1.5 L since admission, UOP 2.2 L  AKI on CKD4 - fluid overload - Catheter in place so likely not obstructive, UA with mod LE and large blood- collected from atheter bag - Creatine baseline previously 3 to 4, discharged on 06/24/2012 with Cre of 4.31  - Cre pending this am  - recently placed AV fistula recently - likely not mature  - Nephrology consulting- appreciate recommendations  - IV lasix 80 mg Q6  - May need dialysis, will need tunneled cath if dialysis neccessary - BMP pending, strict I/O as above  - Some metabolic acidosis previously and placed on sodium bicarb at that time- continued  PVD  - Amputation and bypass surgery during recent admission  - Complaints of bleeding from R great toe, wound on R  Previous amputation site appears well without signs of infection  - Complaints of L foot pain improved this am - consider vascular consult to evaluate wound progress  - On Cipro renally dosed until 06/30/12 - finish today  Anemia  - No source of obvious bleed, hemmocult to be collected - Hgb 8.1  After transfusion of 1 PRBC on 06/29/2012 - Likely anemia of chronic renal disease, also iron deficiency with iron less than 10 - On arinesp, has received ferraheme 1020mg  - continue to monitir  Prostatic hyperplasia  - Prostate found to be large on last admsiion with post void residuals of 1L X2  - Coude catheter in place, continue  - Continue flomax   DM2  - CBGs 88-139 - Home dose lantus, hold novalog  - SSI, monitor CBGs   HTN  - normotensive 104-129/71-75 today  - Holding home BB and amlodipine, restart as needed  Smoking Cessation  - from previous admission patient wants to quit, continue nicotine patch   FEN/GI: Carb modified, SLIV  Prophylaxis: Lovenox  Disposition: Tele, home with home health when resp status improved Code Status: Full  Kevin Fenton, MD 06/30/2012, 7:25 AM

## 2012-06-30 NOTE — Progress Notes (Signed)
I examined this patient and discussed the care plan with Dr Ermalinda Memos and the [facility] team and agree with assessment and plan as documented in the progress note above. His wife pointed out the the nurse had examined him for a sore on his foreskin related to the foley catheter that he's had in place for 10 days, I didn't retract it due to swelling, but there is purulent drainage coming from under the foreskin as well as a 5 mm dorsal ulceration of the foreskin. I'll discuss with Dr Ermalinda Memos.

## 2012-06-30 NOTE — Progress Notes (Signed)
[facility]  Subjective:  Shortness of breath intermittent. Ambulates to the bathroom. No nausea, no vomiting.   UOP: 2225 mls in 24 hrs.   Objective: Vital signs in last 24 hours: Blood pressure 129/71, pulse 88, temperature 97.9 F (36.6 C), temperature source Oral, resp. rate 20, height 5\' 11"  (1.803 m), weight 198 lb 1.6 oz (89.858 kg), SpO2 94.00%.    PHYSICAL EXAM Gen: NAD, pleasant, laying in bed comfortably.  Chest: bibasilar crackles Cardio: regular rate and rhythm, S1, S2 normal, no murmur GI: soft, non-tender; distended Extremities: +1 pitting up to knee bilaterally. Left AV fistula: palpable thrill + bruit Neurologic: No focal deficit  Lab Results:   Lab 06/29/12 0806 06/28/12 2044 06/28/12 1523 06/28/12 1328 06/24/12 0555  NA 142 -- 139 139 --  K 4.8 -- 4.7 4.9 --  CL 104 -- 102 102 --  CO2 20 -- 19 19 --  BUN 97* -- 92* 92* --  CREATININE 5.90* 5.69* 5.43* -- --  ALB -- -- -- -- --  GLUCOSE 91 -- -- -- --  CALCIUM 8.2* -- 8.3* 8.3* --  PHOS -- -- -- -- 5.7*     Basename 06/30/12 0700 06/29/12 2020  WBC 12.2* 13.6*  HGB 8.1* 7.9*  HCT 25.7* 24.5*  PLT 269 268     Scheduled Meds:    . aspirin  81 mg Oral Daily  . atorvastatin  40 mg Oral q1800  . baclofen  10 mg Oral BID  . ciprofloxacin  500 mg Oral Q breakfast  . darbepoetin  100 mcg Subcutaneous Q Mon  . enoxaparin (LOVENOX) injection  30 mg Subcutaneous Q24H  . furosemide  80 mg Intravenous Q6H  . insulin aspart  0-5 Units Subcutaneous QHS  . insulin aspart  0-9 Units Subcutaneous TID WC  . insulin glargine  5 Units Subcutaneous Daily  . nicotine  7 mg Transdermal Daily  . sodium bicarbonate  650 mg Oral BID  . sodium chloride  3 mL Intravenous Q12H  . Tamsulosin HCl  0.8 mg Oral Daily   Continuous Infusions:  PRN Meds:.sodium chloride, acetaminophen, albuterol, ipratropium, ondansetron (ZOFRAN) IV, oxyCODONE, sodium chloride, traZODone  Assessment/Plan: John [name]  is a 57 y.o. year old male with PMH of PVD s/p bypass and toe ampuation, CKD 4, HTN, DM2, and tobacco abuse presenting with new onset dyspnea and hypotension while at [facility]  # CKD4: creatinine of 3.4 in October 2013. Creatinine on d/c from hospital on 1/16 was 4.31. Diuresing well on lasix 80mg  IV q6.  - AM labs pending - PTH pending  # Anemia: iron <10. S/p 1 u pRBC on 1/21 with appropriate response from Hg:7.1 to 8.1. Also received   - received ferreheme yesterday - on aranesp  # Fluid overload and dyspnea: CHF vs CKD. Echo: EF: 50-55% with grade 2 diastolic dysf. Still with O2 requirement. There could also be a component of COPD playing a role.  - continue lasix 80 IV q6  # DM2: per primary # HTN: BP soft. BP meds held.  # Prostatic Hyperplasia: coude cath in place. Flomax.   LOS: 2 days   LOSQ, STEPHANIE 06/30/2012,8:24 AM I have seen and examined this patient and agree with plan per Dr Gwenlyn Saran.  PO4 high, will start binders.  Fluid restriction needs to be adhered to.  Cont to follow Scr. Camillo Quadros T,MD 06/30/2012 9:42 AM

## 2012-06-30 NOTE — Evaluation (Signed)
Physical Therapy Evaluation Patient Details Name: John [name] MRN: [id number] DOB: 02-11-1956 Today's Date: 06/30/2012 Time: 1308-6578 PT Time Calculation (min): 42 min  PT Assessment / Plan / Recommendation Clinical Impression  57 y/o male admitted for progressively worsening SOB with questionable new onset CHF. Recent RLE ray amputation  4th and 5th toes as well as bypass grafts for which he was at [facility] for rehab. Refuses SNF rehab and will d/c home after this admission. Presents to PT today moving well with below impairments affecting safety and independence. Will benfit physical therapy in the acute setting to maximize safety for d/c home with family support.     PT Assessment  Patient needs continued PT services    Follow Up Recommendations  Home health PT;Supervision/Assistance - 24 hour    Does the patient have the potential to tolerate intense rehabilitation      Barriers to Discharge        Equipment Recommendations   (3in1)    Recommendations for Other Services OT consult   Frequency Min 3X/week    Precautions / Restrictions Precautions Precautions: Fall Other Brace/Splint: [name] RLE Restrictions RLE Weight Bearing: Partial weight bearing RLE Partial Weight Bearing Percentage or Pounds: no indicated   Pertinent Vitals/Pain Upon entering the patients room he had taken his O2 off of his nose and SaO2 was 78%. Reapplied his 2 liters and after about a minute he was back in the 90s.       Mobility  Bed Mobility Bed Mobility: Supine to Sit Rolling Right: 4: Min guard Details for Bed Mobility Assistance: verbal step by step sequencing cues for efficiency of movement, increased time to complete Transfers Transfers: Sit to Stand;Stand to Sit Sit to Stand: 4: Min guard;From elevated surface;From bed Stand to Sit: To chair/3-in-1;5: Supervision;With upper extremity assist Details for Transfer Assistance: cues for hand placement and maintainence of  PWB Ambulation/Gait Ambulation/Gait Assistance: 4: Min guard Ambulation Distance (Feet): 150 Feet Assistive device: Rolling walker Ambulation/Gait Assistance Details: cues for tall posture, sequencing of his feet and proper weight bearing (use of upper extremities to unweight RLE during gait) Gait Pattern: Step-to pattern;Trunk flexed              PT Diagnosis: Difficulty walking;Abnormality of gait;Acute pain  PT Problem List: Decreased activity tolerance;Decreased balance;Decreased knowledge of use of DME;Decreased mobility;Decreased knowledge of precautions;Decreased skin integrity;Cardiopulmonary status limiting activity PT Treatment Interventions: DME instruction;Gait training;Functional mobility training;Therapeutic activities;Therapeutic exercise;Balance training;Neuromuscular re-education;Patient/family education   PT Goals Acute Rehab PT Goals PT Goal Formulation: With patient/family Time For Goal Achievement: 07/07/12 Potential to Achieve Goals: Good Pt will go Supine/Side to Sit: with modified independence PT Goal: Supine/Side to Sit - Progress: Goal set today Pt will go Sit to Supine/Side: with modified independence PT Goal: Sit to Supine/Side - Progress: Goal set today Pt will go Sit to Stand: with modified independence PT Goal: Sit to Stand - Progress: Goal set today Pt will Transfer Bed to Chair/Chair to Bed: with modified independence PT Transfer Goal: Bed to Chair/Chair to Bed - Progress: Goal set today Pt will Ambulate: with modified independence;with least restrictive assistive device PT Goal: Ambulate - Progress: Goal set today  Visit Information  Last PT Received On: 06/30/12 Assistance Needed: +1    Subjective Data  Subjective: Im tired of people telling me how to feel!    Prior Functioning  Home Living Lives With: Spouse Available Help at Discharge: Family;Available 24 hours/day Type of Home: House Home Access: Level entry  Home Layout: One  level Bathroom Shower/Tub: Health visitor: Standard Home Adaptive Equipment: Environmental consultant - rolling;Built-in shower seat Prior Function Level of Independence:  (has been at [facility] getting therapy) Vocation: On disability Communication Communication: No difficulties    Cognition  Overall Cognitive Status: Appears within functional limits for tasks assessed/performed Arousal/Alertness: Awake/alert Orientation Level: Disoriented to;Time Behavior During Session: [facility] for tasks performed    Extremity/Trunk Assessment Right Upper Extremity Assessment RUE ROM/Strength/Tone: [facility] for tasks assessed Left Upper Extremity Assessment LUE ROM/Strength/Tone: WFL for tasks assessed Right Lower Extremity Assessment RLE ROM/Strength/Tone: [facility] for tasks assessed Left Lower Extremity Assessment LLE ROM/Strength/Tone: WFL for tasks assessed   Balance    End of Session PT - End of Session Equipment Utilized During Treatment: Gait belt Activity Tolerance: Patient tolerated treatment well Patient left: in chair;with call bell/phone within reach;with family/visitor present Nurse Communication: Mobility status  GP     [facility] HELEN 06/30/2012, 10:25 AM

## 2012-07-01 LAB — GLUCOSE, CAPILLARY
Glucose-Capillary: 135 mg/dL — ABNORMAL HIGH (ref 70–99)
Glucose-Capillary: 164 mg/dL — ABNORMAL HIGH (ref 70–99)
Glucose-Capillary: 205 mg/dL — ABNORMAL HIGH (ref 70–99)

## 2012-07-01 LAB — CBC
Hemoglobin: 8.5 g/dL — ABNORMAL LOW (ref 13.0–17.0)
Platelets: 311 10*3/uL (ref 150–400)
RBC: 2.96 MIL/uL — ABNORMAL LOW (ref 4.22–5.81)
WBC: 13.2 10*3/uL — ABNORMAL HIGH (ref 4.0–10.5)

## 2012-07-01 LAB — RENAL FUNCTION PANEL
CO2: 23 mEq/L (ref 19–32)
Chloride: 102 mEq/L (ref 96–112)
GFR calc Af Amer: 11 mL/min — ABNORMAL LOW (ref >90)
GFR calc non Af Amer: 10 mL/min — ABNORMAL LOW (ref >90)
Glucose, Bld: 173 mg/dL — ABNORMAL HIGH (ref 70–99)
Potassium: 4.7 mEq/L (ref 3.5–5.1)
Sodium: 144 mEq/L (ref 135–145)

## 2012-07-01 LAB — HEPATITIS B SURFACE ANTIGEN: Hepatitis B Surface Ag: NEGATIVE

## 2012-07-01 MED ORDER — CALCITRIOL 0.25 MCG PO CAPS
0.2500 ug | ORAL_CAPSULE | Freq: Every day | ORAL | Status: DC
  Administered 2012-07-01 – 2012-07-03 (×3): 0.25 ug via ORAL
  Filled 2012-07-01 (×4): qty 1

## 2012-07-01 MED ORDER — METOPROLOL SUCCINATE ER 25 MG PO TB24
25.0000 mg | ORAL_TABLET | Freq: Every day | ORAL | Status: DC
  Administered 2012-07-01 – 2012-07-03 (×3): 25 mg via ORAL
  Filled 2012-07-01 (×3): qty 1

## 2012-07-01 NOTE — Progress Notes (Signed)
Physical Therapy Treatment Patient Details Name: John [name] MRN: [id number] DOB: [birth year] Today's Date: 07/01/2012 Time: 4098-1191 PT Time Calculation (min): 30 min  PT Assessment / Plan / Recommendation Comments on Treatment Session  57 y/o male admitted for progressively worsening SOB with questionable new onset CHF. 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 Moving well today. Activity tolerance continue to improve.    Follow Up Recommendations  Home health PT;Supervision/Assistance - 24 hour     Does the patient have the potential to tolerate intense rehabilitation     Barriers to Discharge        Equipment Recommendations   (3in1)    Recommendations for Other Services    Frequency Min 3X/week   Plan Discharge plan remains appropriate;Frequency remains appropriate    Precautions / Restrictions Precautions Precautions: Fall Other Brace/Splint: [name] Restrictions Weight Bearing Restrictions: Yes RLE Weight Bearing: Partial weight bearing RLE Partial Weight Bearing Percentage or Pounds: not indicated in orders       Mobility  Bed Mobility Bed Mobility: Not assessed Transfers Transfers: Sit to Stand;Stand to Sit Sit to Stand: 4: Min guard;With upper extremity assist;From bed Stand to Sit: 4: Min guard;With upper extremity assist;To bed;To chair/3-in-1 Details for Transfer Assistance: cues for safe hand placement, gauridng for stability Ambulation/Gait Ambulation/Gait Assistance: 4: Min guard Ambulation Distance (Feet): 190 Feet Assistive device: Rolling walker Ambulation/Gait Assistance Details: cues for tall posture and sequencig of steps (proper use of RW to [employer] RLE)    Exercises General Exercises - Lower Extremity Long Arc Quad: AROM;Both;20 reps;Seated Straight Leg Raises: AROM;Both;10 reps;Seated Hip Flexion/Marching: AROM;Both;10 reps;Standing     PT Goals Acute Rehab PT  Goals PT Goal: Sit to Stand - Progress: Progressing toward goal PT Transfer Goal: Bed to Chair/Chair to Bed - Progress: Progressing toward goal PT Goal: Ambulate - Progress: Progressing toward goal  Visit Information  Last PT Received On: 07/01/12 Assistance Needed: +1    Subjective Data  Subjective: I did exercises like this at rehab.    Cognition  Overall Cognitive Status: Appears within functional limits for tasks assessed/performed Arousal/Alertness: Awake/alert Orientation Level: Appears intact for tasks assessed Behavior During Session: [facility] for tasks performed    Balance     End of Session PT - End of Session Equipment Utilized During Treatment: Gait belt Activity Tolerance: Patient tolerated treatment well Patient left: in chair;with call bell/phone within reach Nurse Communication: Mobility status   GP     [facility] HELEN 07/01/2012, 1:44 PM

## 2012-07-01 NOTE — Progress Notes (Signed)
Patient given PRN zofran for patient vomited x1. VSS, Patient states he feels better and is sitting on side of bed. Will notify oncoming nurse. Will continue to monitor as needed to end of shift.

## 2012-07-01 NOTE — Progress Notes (Signed)
[facility]  Subjective:  Shortness of breath improving. Walking to bathroom without significant dyspnea. + nausea and + vomiting x2 this am.    Objective: Vital signs in last 24 hours: Blood pressure 125/68, pulse 99, temperature 98 F (36.7 C), temperature source Oral, resp. rate 18, height 5\' 11"  (1.803 m), weight 197 lb 12.8 oz (89.721 kg), SpO2 93.00%.    PHYSICAL EXAM Gen: NAD, sitting on edge of bed comfortably, taking breathing treatment Chest: bibasilar crackles bilaterally Cardio: regular rate and rhythm, S1, S2 normal, no murmur GI: soft, non-tender; distended Extremities: +2 pitting up to knee bilaterally Left AV fistula: palpable thrill + bruit Neurologic: No focal deficit  Lab Results:   Lab 07/01/12 0605 06/30/12 0700 06/29/12 1345  NA 144 144 --  K 4.7 4.7 --  CL 102 104 --  CO2 23 21 --  BUN 105* 105* --  CREATININE 5.83* 6.22* --  ALB -- -- --  GLUCOSE 173* -- --  CALCIUM 8.8 8.4 7.9*  PHOS 6.8* 7.8* --     Basename 07/01/12 0605 06/30/12 0700  WBC 13.2* 12.2*  HGB 8.5* 8.1*  HCT 26.6* 25.7*  PLT 311 269     Scheduled Meds:    . albuterol  2.5 mg Nebulization TID   And  . ipratropium  0.5 mg Nebulization TID  . aspirin  81 mg Oral Daily  . atorvastatin  40 mg Oral q1800  . baclofen  10 mg Oral BID  . calcium carbonate  2 tablet Oral TID WC  . darbepoetin  100 mcg Subcutaneous Q Mon  . enoxaparin (LOVENOX) injection  30 mg Subcutaneous Q24H  . furosemide  80 mg Intravenous Q6H  . insulin aspart  0-5 Units Subcutaneous QHS  . insulin aspart  0-9 Units Subcutaneous TID WC  . insulin glargine  5 Units Subcutaneous Daily  . mupirocin ointment   Topical BID  . nicotine  7 mg Transdermal Daily  . sodium bicarbonate  650 mg Oral BID  . sodium chloride  3 mL Intravenous Q12H  . Tamsulosin HCl  0.8 mg Oral Daily   Continuous Infusions:  PRN Meds:.sodium chloride, acetaminophen, albuterol, ipratropium, ondansetron (ZOFRAN) IV,  oxyCODONE, sodium chloride, traZODone  Assessment/Plan: John [name] is a 57 y.o. year old male with PMH of PVD s/p bypass and toe ampuation, CKD 4, HTN, DM2, and tobacco abuse presenting with new onset dyspnea and hypotension while at [facility]  # CKD4: creatinine of 3.4 in October 2013. Creatinine on d/c from hospital on 1/16 was 4.31. Currently 5.83 improved from 6.22 yesterday. UOP improved with negative balance of 2L in last 24hrs.  - continue lasix 80IV q6 - no acute dialysis needs  # Secondary hyperparathyroid: PTH: 327. - start calcitriol  # Anemia: iron <10 Hemoglobin improved to 8.5.  - s/p 1u pRBC and IV iron x1 - on aranesp .  # Fluid overload: CHF vs CKD.    # DM2: per primary # HTN: BP stable. BP meds held.    LOS: 3 days   LOSQ, STEPHANIE 07/01/2012,8:32 AM I have seen and examined this patient and agree with plan per Dr Gwenlyn Saran.  Some nausea that is intermittent and possibly due to CKD.  I would prefer to hold off on dialysis as long as we can or till AVF is mature as he has an Ax-fem graft in place and would not want to risk infection with HD catheter unless absolutely. Terrie Grajales T,MD 07/01/2012 11:13 AM

## 2012-07-01 NOTE — Progress Notes (Signed)
Family Medicine Teaching Service Daily Progress Note Service Page: [phone]   Subjective:  Dyspnea improved, unsure of wether nebulizers are helping or not but does describe a chronic cough. No chest pain. Some nause this am and vomiting X2 improved with zofran  Objective: Temp:  [97.8 F (36.6 C)-98.5 F (36.9 C)] 98 F (36.7 C) (01/23 0640) Pulse Rate:  [91-99] 99  (01/23 0640) Resp:  [18-20] 18  (01/23 0640) BP: (124-143)/(58-69) 125/68 mmHg (01/23 0640) SpO2:  [92 %-100 %] 93 % (01/23 0735) Weight:  [197 lb 12.8 oz (89.721 kg)] 197 lb 12.8 oz (89.721 kg) (01/23 0640) Exam: Gen: NAD, alert, cooperative with exam  HEENT: NCAT, PERRL  CV: RRR, good S1/S2, no murmur  Resp: CTAB, non labored on 1 L via [place].  Abd: SNTND, BS present, no guarding or organomegaly  Ext: 1+ Edema R >L, R foot with clean dry intact bandage covering recent surgical site of removal 4th and 5th digit Neuro: Alert and oriented, No gross deficits GU: 5 mm dorsal hypopigmented ulceration on foreskin, 3 mm tender flesh colored papule on ventral side of glans with purulent exudate. Catheter in place.   I have reviewed the patient's medications, labs, imaging, and diagnostic testing.  Notable results are summarized below.  CBC BMET   Lab 07/01/12 0605 06/30/12 0700 06/29/12 2020  WBC 13.2* 12.2* 13.6*  HGB 8.5* 8.1* 7.9*  HCT 26.6* 25.7* 24.5*  PLT 311 269 268    Lab 07/01/12 0605 06/30/12 0700 06/29/12 1345  NA 144 144 --  K 4.7 4.7 --  CL 102 104 --  CO2 23 21 --  BUN 105* 105* --  CREATININE 5.83* 6.22* --  GLUCOSE 173* 105* --  CALCIUM 8.8 8.4 7.9*      06/28/2012 22:06  Iron <10 (L)  UIBC 178  TIBC Not calculated due to Iron <10.  Saturation Ratios Not calculated due to Iron <10.      Lab 06/29/12 0806 06/29/12 0210 06/28/12 2044  TROPONINI <0.30 <0.30 <0.30  D-Dimer 4.49    06/28/2012 19:12  Color, Urine YELLOW  APPearance HAZY (A)  Specific Gravity, Urine 1.018  pH 5.0  Glucose  NEGATIVE  Bilirubin Urine NEGATIVE  Ketones, ur NEGATIVE  Protein 100 (A)  Urobilinogen, UA 0.2  Nitrite NEGATIVE  Leukocytes, UA MODERATE (A)  Hgb urine dipstick LARGE (A)  Urine-Other FEW YEAST  WBC, UA 7-10  RBC / HPF 7-10  Squamous Epithelial / LPF FEW (A)  Bacteria, UA FEW (A)    Imaging/Diagnostic Tests: CXR 06/28/2012  IMPRESSION:  Cardiomegaly and mild interstitial prominence likely due to edema.  2D TTE 06/29/2012 Study Conclusions  - Left ventricle: The cavity size was normal. There was mild concentric hypertrophy. Systolic function was mildly reduced. The estimated ejection fraction was in the range of 50% to 55%. Although no diagnostic regional wall motion abnormality was identified, this possibility cannot be completely excluded on the basis of this study. Features are consistent with a pseudonormal left ventricular filling pattern, with concomitant abnormal relaxation and increased filling pressure (grade 2 diastolic dysfunction). Doppler parameters are consistent with elevated mean left atrial filling pressure. - Mitral valve: Moderate to severe regurgitation directed centrally. - Left atrium: The atrium was moderately to severely dilated. - Atrial septum: No defect or patent foramen ovale was identified. - Pulmonary arteries: Systolic pressure was moderately to severely increased. PA peak pressure: 64mm Hg (S). - Pericardium, extracardiac: A small pericardial effusion was identified circumferential to the heart.  Plan: John [name] is a 57 y.o. year old male with PMH of PVD s/p bypass and toe ampuation, CKD 4, HTN, DM2, and tobacco abuse presenting with new onset dyspnea and hypotension while at [facility]. We have him admitted for fluid overload from  apparent new heart failure vs Acute on Chronic Kidney disease.   Dyspnea - Acute on Likely Chronic Diastolic Heart failure + Acute on Chronic Kidney disease stage 5, additionally he has COPD - improved on 1 L  via [place], normotensive now, - wean O2 - BNP 33K, EKG without ischemic changes, CXR with pulmonary  - Troponins negative times 3 - IV lasix 80 q6 as explained below - continue - negative 4.2 L since admission, UOP 2.2 L  AKI on CKD4 - fluid overload - Catheter in place so likely not obstructive, UA with mod LE and large blood- collected from catheter bag - Creatine baseline previously 3 to 4, discharged on 06/24/2012 with Cre of 4.31  - Cre as bad as 6.22 on 1/22 now improved to 5.33 - recently placed AV fistula recently - likely not mature  - Nephrology consulting- appreciate recommendations  - IV lasix 80 mg Q6 - strict I/O as above  - Some metabolic acidosis previously and placed on sodium bicarb at that time- continued - Hyperphosphatemia- phosphate binders per nephro - secondary hyperparathyroidism- PTH 327.2, Calcitriol per nephro  COPD - Q4/Q6 duonebs, consider starting Spiriva  PVD  - Amputation and bypass surgery during recent admission  - PO cipro finished with last dose on 1/22  Anemia  - No source of obvious bleed, hemmocult to be collected - Hgb 8.5 from 8.1 - Likely anemia of chronic renal disease, also iron deficiency with iron less than 10 - On arinesp, has received ferraheme 1020mg  - continue to monitor  Prostatic hyperplasia  - Prostate found to be large on last admsiion with post void residuals of 1L X2  - Coude catheter in place, continue  - Continue flomax   Penile sore-  - With purulent drainage, not obviously associated with catheter on exam,  - mupirocin BID, will follow  DM2  - CBGs 148-164 - Home dose lantus, hold novalog  - SSI, monitor CBGs   HTN  - 124-143/58-69, P 91-99, restart BB - Holding home amlodipine, restart as needed  Smoking Cessation  - from previous admission patient wants to quit, continue nicotine patch   FEN/GI: Carb modified, SLIV, fluid restricted Prophylaxis: Lovenox  Disposition: Tele, home with home health when resp  status improved Code Status: Full  Kevin Fenton, MD 07/01/2012, 8:54 AM

## 2012-07-01 NOTE — Clinical Documentation Improvement (Signed)
GENERIC DOCUMENTATION CLARIFICATION QUERY  THIS DOCUMENT IS NOT A PERMANENT PART OF THE MEDICAL RECORD   Please update your documentation within the medical record to reflect your response to this query.                                                                                         07/01/12   Dr. Adriana Simas and/or  Associates,  In a better effort to capture your patient's severity of illness, reflect appropriate length of stay and utilization of resources, a review of the patient medical record has revealed the following indicators:  "Dyspnea - Acute on Likely Chronic Diastolic Heart failure + Acute on Chronic Kidney disease stage 5, additionally he has COPD" Kevin Fenton, MD  07/01/2012, 8:54 AM Progress Note  Known History of Hypertension, Diabetes Mellitus Type II, and COPD  06/29/12  Echo Study Conclusions - Left ventricle: The cavity size was normal. There was mild concentric hypertrophy. Systolic function was mildly reduced. The estimated ejection fraction was in the range of 50% to 55%. Although no diagnostic regional wall motion abnormality was identified, this possibility cannot be completely excluded on the basis of this study. Features are consistent with a pseudonormal left ventricular filling pattern, with concomitant abnormal relaxation and increased filling pressure (grade 2 diastolic dysfunction). Doppler parameters are consistent with elevated mean left atrial filling pressure. - Mitral valve: Moderate to severe regurgitation directed centrally. - Left atrium: The atrium was moderately to severely dilated. - Atrial septum: No defect or patent foramen ovale was identified. - Pulmonary arteries: Systolic pressure was moderately to severely increased. PA peak pressure: 64mm Hg (S). - Pericardium, extracardiac: A small pericardial effusion was identified circumferential to the heart.    Based on your clinical judgment, please document in the progress notes and  discharge summary if a condition below provides greater specificity regarding the patient's cardiac and renal disease:   - Hypertensive Heart & Renal Disease   - Other Condition   - Unable to Clinically Determine   In responding to this query please exercise your independent judgment.    The fact that a query is asked, does not imply that any particular answer is desired or expected.    Reviewed: additional documentation in the medical record  Thank You,  Jerral Ralph  RN BSN CCDS Certified Clinical Documentation Specialist: Cell   [phone]  Health Information Management [place]  TO RESPOND TO THE THIS QUERY, FOLLOW THE INSTRUCTIONS BELOW:  1. If needed, update documentation for the patient's encounter via the notes activity.  2. Access this query again and click edit on the In [employer].  3. After updating, or not, click F2 to complete all highlighted (required) fields concerning your review. Select "additional documentation in the medical record" OR "no additional documentation provided".  4. Click Sign note button.  5. The deficiency will fall out of your In Basket *Please let us know if you are not able to complete this workflow by phone or e-mail (listed below).

## 2012-07-01 NOTE — Progress Notes (Signed)
I examined this patient and discussed the care plan with Dr Ermalinda Memos and the [facility] team and agree with assessment and plan as documented in the progress note above. His penile foreskin ulcer appears improved today.

## 2012-07-02 LAB — RENAL FUNCTION PANEL
CO2: 30 mEq/L (ref 19–32)
Calcium: 8.9 mg/dL (ref 8.4–10.5)
Creatinine, Ser: 5.62 mg/dL — ABNORMAL HIGH (ref 0.50–1.35)
GFR calc Af Amer: 12 mL/min — ABNORMAL LOW (ref >90)
GFR calc non Af Amer: 10 mL/min — ABNORMAL LOW (ref >90)
Glucose, Bld: 190 mg/dL — ABNORMAL HIGH (ref 70–99)
Phosphorus: 6 mg/dL — ABNORMAL HIGH (ref 2.3–4.6)
Sodium: 143 mEq/L (ref 135–145)

## 2012-07-02 LAB — GLUCOSE, CAPILLARY
Glucose-Capillary: 131 mg/dL — ABNORMAL HIGH (ref 70–99)
Glucose-Capillary: 120 mg/dL — ABNORMAL HIGH (ref 70–99)
Glucose-Capillary: 229 mg/dL — ABNORMAL HIGH (ref 70–99)

## 2012-07-02 LAB — CBC
MCH: 28.6 pg (ref 26.0–34.0)
MCHC: 31.4 g/dL (ref 30.0–36.0)
MCV: 91 fL (ref 78.0–100.0)
Platelets: 320 10*3/uL (ref 150–400)

## 2012-07-02 MED ORDER — IPRATROPIUM BROMIDE 0.02 % IN SOLN: 0.5000 mg | Freq: Four times a day (QID) | RESPIRATORY_TRACT | Status: DC | PRN

## 2012-07-02 MED ORDER — INSULIN GLARGINE 100 UNIT/ML ~~LOC~~ SOLN
6.0000 [IU] | Freq: Every day | SUBCUTANEOUS | Status: DC
  Administered 2012-07-02 – 2012-07-03 (×2): 6 [IU] via SUBCUTANEOUS

## 2012-07-02 MED ORDER — ALBUTEROL SULFATE (5 MG/ML) 0.5% IN NEBU: 5.0000 mg | INHALATION_SOLUTION | Freq: Four times a day (QID) | RESPIRATORY_TRACT | Status: DC | PRN

## 2012-07-02 MED ORDER — FUROSEMIDE 80 MG PO TABS
160.0000 mg | ORAL_TABLET | Freq: Three times a day (TID) | ORAL | Status: DC
  Administered 2012-07-02 – 2012-07-03 (×4): 160 mg via ORAL
  Filled 2012-07-02 (×7): qty 2

## 2012-07-02 NOTE — Progress Notes (Signed)
[facility]  Subjective: Occasional shortness of breath. Nausea resolved. States that he has had nausea and vomiting, intermittently for years.  Ambulating with oxygen. Making urine.    Objective: Vital signs in last 24 hours: Blood pressure 133/68, pulse 88, temperature 97.6 F (36.4 C), temperature source Oral, resp. rate 18, height 5\' 11"  (1.803 m), weight 198 lb 14.4 oz (90.22 kg), SpO2 96.00%.    PHYSICAL EXAM Gen: NAD, sitting on edge of bed comfortably Chest: bibasilar crackles bilaterally Cardio: regular rate and rhythm, S1, S2 normal, 2/6 systolic murmur GI: soft, non-tender; distended Extremities: +1 pitting up to knee bilaterally Left AV fistula: palpable thrill + bruit Neurologic: No focal deficit  Lab Results:   Lab 07/02/12 0510 07/01/12 0605 06/30/12 0700  NA 143 144 144  K 5.0 4.7 4.7  CL 101 102 104  CO2 30 23 21   BUN 102* 105* 105*  CREATININE 5.62* 5.83* 6.22*  ALB -- -- --  GLUCOSE 190* -- --  CALCIUM 8.9 8.8 8.4  PHOS 6.0* 6.8* 7.8*     Basename 07/02/12 0510 07/01/12 0605  WBC 11.4* 13.2*  HGB 8.3* 8.5*  HCT 26.4* 26.6*  PLT 320 311     Scheduled Meds:    . albuterol  2.5 mg Nebulization TID   And  . ipratropium  0.5 mg Nebulization TID  . aspirin  81 mg Oral Daily  . atorvastatin  40 mg Oral q1800  . baclofen  10 mg Oral BID  . calcitRIOL  0.25 mcg Oral Daily  . calcium carbonate  2 tablet Oral TID WC  . darbepoetin  100 mcg Subcutaneous Q Mon  . enoxaparin (LOVENOX) injection  30 mg Subcutaneous Q24H  . furosemide  80 mg Intravenous Q6H  . insulin aspart  0-5 Units Subcutaneous QHS  . insulin aspart  0-9 Units Subcutaneous TID WC  . insulin glargine  6 Units Subcutaneous Daily  . metoprolol succinate  25 mg Oral Daily  . mupirocin ointment   Topical BID  . nicotine  7 mg Transdermal Daily  . sodium bicarbonate  650 mg Oral BID  . sodium chloride  3 mL Intravenous Q12H  . Tamsulosin HCl  0.8 mg Oral Daily    Continuous Infusions:  PRN Meds:.sodium chloride, acetaminophen, albuterol, ipratropium, ondansetron (ZOFRAN) IV, oxyCODONE, sodium chloride, traZODone  Assessment/Plan: John [name] is a 57 y.o. year old male with PMH of PVD s/p bypass and toe ampuation, CKD 4, HTN, DM2, and tobacco abuse presenting with new onset dyspnea and hypotension while at [facility]  # CKD4: creatinine of 3.4 in October 2013. Creatinine on d/c from hospital on 1/16 was 4.31. Currently 5.62 improved from 5.83 yesterday. Good UOP with 2.7L down in 24hrs.   - consider decreasing IV lasix frequency to q12 and monitor - no acute dialysis needs, nausea improved.   # Secondary hyperparathyroid: PTH: 327. - started calcitriol 07/01/12  # Anemia: iron <10 Hemoglobin improved to 8.5.  - s/p 1u pRBC and IV iron x1 - on aranesp  # Fluid overload: CHF vs CKD.  Echo EF: 50-55% with grade 2 diastolic dysf.   # DM2: per primary # HTN: BP stable. BP meds held.    LOS: 4 days   LOSQ, STEPHANIE 07/02/2012,8:25 AM I have seen and examined this patient and agree with plan per Dr Gwenlyn Saran.  Renal fx stable and UO good.  Will switch to PO lasix.  From my standpoint the foley can be removed.  He had  issues in previous admission with post void residuals and so foley placed.  I would give him another voiding trial. Saul Dorsi T,MD 07/02/2012 9:59 AM

## 2012-07-02 NOTE — Progress Notes (Signed)
Utilization Review Completed.   Dmonte Maher, RN, BSN Nurse Case Manager  [phone]  

## 2012-07-02 NOTE — Progress Notes (Signed)
Physical Therapy Treatment Patient Details Name: John [name] MRN: [id number] DOB: February 05, 1956 Today's Date: 07/02/2012 Time: 0832-0909 PT Time Calculation (min): 37 min  PT Assessment / Plan / Recommendation Comments on Treatment Session  Admitted for SOB with questionable new onset CHF. Recent RLE ray amputation. Improving well. Safety education provided to wife and patient for eventual d/c home with regards to supervision for mobility and fall hazards.     Follow Up Recommendations  Home health PT;Supervision/Assistance - 24 hour     Does the patient have the potential to tolerate intense rehabilitation     Barriers to Discharge        Equipment Recommendations   (3in1)    Recommendations for Other Services    Frequency Min 3X/week   Plan Discharge plan remains appropriate;Frequency remains appropriate    Precautions / Restrictions Precautions Precautions: Fall Other Brace/Splint: darko shoe Restrictions Weight Bearing Restrictions: Yes RLE Weight Bearing: Weight bearing as tolerated RLE Partial Weight Bearing Percentage or Pounds: not indicated in orders, assume 50%   Pertinent Vitals/Pain Chronic back pain, pt reports tylenol given earlier (MD aware and in the patients room when he stated this)    Mobility  Bed Mobility Bed Mobility: Not assessed Transfers Sit to Stand: 5: Supervision;With upper extremity assist Stand to Sit: 5: Supervision;With upper extremity assist Details for Transfer Assistance: cues for safe hand placement Ambulation/Gait Ambulation/Gait Assistance: 5: Supervision Ambulation Distance (Feet): 200 Feet Assistive device: Rolling walker Ambulation/Gait Assistance Details: cues for sequencing of steps and tall posture, ambulated on RA with SaO2 fluctuating between 88-94%, cues for 2-3 standing rests and controlled breathing to bring SaO2 back above 90% but pt rebounds very quickly (within seconds of resting) Gait Pattern: Trunk flexed;Step-to  pattern    Exercises General Exercises - Lower Extremity Long Arc Quad: AROM;Both;20 reps;Seated    PT Goals Acute Rehab PT Goals PT Goal: Sit to Stand - Progress: Progressing toward goal PT Transfer Goal: Bed to Chair/Chair to Bed - Progress: Progressing toward goal PT Goal: Ambulate - Progress: Progressing toward goal  Visit Information  Last PT Received On: 07/02/12 Assistance Needed: +1    Subjective Data  Subjective: I'll be fine.    Cognition  Overall Cognitive Status: Appears within functional limits for tasks assessed/performed Arousal/Alertness: Awake/alert Orientation Level: Appears intact for tasks assessed Behavior During Session: [facility] for tasks performed Cognition - Other Comments: very confident, needs reminders, gets frustrated with cueing    Balance     End of Session PT - End of Session Equipment Utilized During Treatment: Gait belt Activity Tolerance: Patient tolerated treatment well Patient left: in bed;with call bell/phone within reach;with family/visitor present Nurse Communication: Mobility status   GP     [facility] HELEN 07/02/2012, 1:47 PM

## 2012-07-02 NOTE — Progress Notes (Signed)
I examined this patient and discussed the care plan with Dr Cook and the FPTS team and agree with assessment and plan as documented in the progress note above.  

## 2012-07-02 NOTE — Progress Notes (Signed)
During dressing change, wound appeared to have more yellowish tissue noted than granulated tissue to show circulation. Therefore just for a more skilled eye to evaluate wound, wound consult put in to see if anything could be done to help promote wound healing for the fact he had previous stent put in to help with blood flow. Monitored patient to end of shift and notified oncoming nurse of the above.

## 2012-07-02 NOTE — Progress Notes (Signed)
Family Medicine Teaching Service Daily Progress Note Service Page: [phone]   Subjective:  Feeling well this am. No complaints. Patient was preparing to ambulate with PT.  Objective: Temp:  [97.6 F (36.4 C)-97.9 F (36.6 C)] 97.6 F (36.4 C) (01/24 0307) Pulse Rate:  [88-97] 88  (01/24 0307) Resp:  [18] 18  (01/24 0307) BP: (133-141)/(68-71) 133/68 mmHg (01/24 0307) SpO2:  [93 %-96 %] 96 % (01/24 0307) Weight:  [198 lb 14.4 oz (90.22 kg)] 198 lb 14.4 oz (90.22 kg) (01/24 0307)  Filed Weights   06/30/12 0656 07/01/12 0640 07/02/12 0307  Weight: 198 lb 1.6 oz (89.858 kg) 197 lb 12.8 oz (89.721 kg) 198 lb 14.4 oz (90.22 kg)    Intake/Output Summary (Last 24 hours) at 07/02/12 0728 Last data filed at 07/02/12 0216  Gross per 24 hour  Intake    223 ml  Output   2275 ml  Net  -2052 ml   Physical Exam: Gen: sitting up at bedside. NAD. CV: RRR, no murmurs, rubs, or gallops. Resp: CTAB. No rales, rhonchi, or wheezing appreciated. Abd: soft, nontender, nondistended. No appreciable organomegaly. Ext: 2+ pitting LE edema.  R foot with clean dry intact bandage covering recent surgical site of removal 4th and 5th digit Neuro: Alert and oriented, No gross deficits GU: 5 mm dorsal hypopigmented ulceration on foreskin, 3 mm tender flesh colored papule on ventral side of glans with purulent exudate. Catheter in place.   CBC BMET   Lab 07/02/12 0510 07/01/12 0605 06/30/12 0700  WBC 11.4* 13.2* 12.2*  HGB 8.3* 8.5* 8.1*  HCT 26.4* 26.6* 25.7*  PLT 320 311 269    Lab 07/02/12 0510 07/01/12 0605 06/30/12 0700  NA 143 144 144  K 5.0 4.7 4.7  CL 101 102 104  CO2 30 23 21   BUN 102* 105* 105*  CREATININE 5.62* 5.83* 6.22*  GLUCOSE 190* 173* 105*  CALCIUM 8.9 8.8 8.4      06/28/2012 22:06  Iron <10 (L)  UIBC 178  TIBC Not calculated due to Iron <10.  Saturation Ratios Not calculated due to Iron <10.      Lab 06/29/12 0806 06/29/12 0210 06/28/12 2044  TROPONINI <0.30 <0.30  <0.30   D-Dimer 4.49  Imaging/Diagnostic Tests: CXR 06/28/2012  IMPRESSION:  Cardiomegaly and mild interstitial prominence likely due to edema.  2D TTE 06/29/2012 Study Conclusions - Left ventricle: The cavity size was normal. There was mild concentric hypertrophy. Systolic function was mildly reduced. The estimated ejection fraction was in the range of 50% to 55%. Although no diagnostic regional wall motion abnormality was identified, this possibility cannot be completely excluded on the basis of this study. Features are consistent with a pseudonormal left ventricular filling pattern, with concomitant abnormal relaxation and increased filling pressure (grade 2 diastolic dysfunction). Doppler parameters are consistent with elevated mean left atrial filling pressure. - Mitral valve: Moderate to severe regurgitation directed centrally. - Left atrium: The atrium was moderately to severely dilated. - Atrial septum: No defect or patent foramen ovale was identified. - Pulmonary arteries: Systolic pressure was moderately to severely increased. PA peak pressure: 64mm Hg (S). - Pericardium, extracardiac: A small pericardial effusion was identified circumferential to the heart.  Assessment/Plan: John [name] is a 57 y.o. year old male with PMH of PVD s/p bypass and toe ampuation, CKD 4, HTN, DM2, and tobacco abuse presenting with new onset dyspnea and hypotension while at [facility]. We have him admitted for fluid overload from  apparent new heart  failure vs Acute on Chronic Kidney disease.   Dyspnea - Acute on Likely Chronic Diastolic Heart failure + Acute on Chronic Kidney disease stage 5, additionally he has COPD - improved on 1 L via [place], Will attempt to wean today. - BNP 33K, EKG without ischemic changes, CXR with pulmonary  - Troponins negative times 3 - IV lasix 80 q6 as explained below - continue.  Patient responding well with good diuresis. - negative 6.2 L since admission  AKI on  CKD4 - fluid overload - Catheter in place so likely not obstructive, UA with mod LE and large blood- collected from catheter bag - Creatine baseline previously 3 to 4, discharged on 06/24/2012 with Cre of 4.31  - Cre as bad as 6.22 on 1/22 now improved to 5.62 - recently placed AV fistula recently - Nephrology consulting- appreciate recommendations  - Switched to PO Lasix today - 160 mg TID - Some metabolic acidosis previously and placed on sodium bicarb at that time.  Continuing to receive Bicarb (per renal) - Hyperphosphatemia- phosphate binders per nephro - secondary hyperparathyroidism- PTH 327.2, Calcitriol per nephro  COPD - Duonebs TID with Albuterol Q6 PRN - Will discharge on Spiriva  PVD  - Amputation and bypass surgery during recent admission  - PO cipro finished with last dose on 1/22  Anemia  - No source of obvious bleed, hemmocult to be collected - Hgb stable at 8.3 - Likely anemia of chronic renal disease, also iron deficiency with iron less than 10 - On arinesp, has received ferraheme 1020mg  - continue to monitor  Prostatic hyperplasia  - Prostate found to be large on last admission with post void residuals of 1L X2  - Coude catheter in place - Will continue flomax  Penile sore - Mupirocin BID - Will continue to monitor for improvement  DM2  - CBG 180 this am. - Lantus increased to 6 Units - Will continue sensitive SSI and QHS coverage  HTN  - Well controlled currently.  - Will continue Toprol XL 25 mg daily  Smoking Cessation  - from previous admission patient wants to quit, continue nicotine patch   FEN/GI: Carb modified, Saline lock IV Prophylaxis: Lovenox  Disposition: Pending clinical improvement Code Status: Full code  Everlene Other, DO 07/02/2012, 7:24 AM

## 2012-07-03 LAB — RENAL FUNCTION PANEL
Albumin: 2.9 g/dL — ABNORMAL LOW (ref 3.5–5.2)
CO2: 30 mEq/L (ref 19–32)
Chloride: 100 mEq/L (ref 96–112)
GFR calc Af Amer: 13 mL/min — ABNORMAL LOW (ref >90)
GFR calc non Af Amer: 11 mL/min — ABNORMAL LOW (ref >90)
Potassium: 5.3 mEq/L — ABNORMAL HIGH (ref 3.5–5.1)

## 2012-07-03 LAB — GLUCOSE, CAPILLARY: Glucose-Capillary: 161 mg/dL — ABNORMAL HIGH (ref 70–99)

## 2012-07-03 LAB — CBC
MCV: 91.5 fL (ref 78.0–100.0)
Platelets: 372 10*3/uL (ref 150–400)
RBC: 3.31 MIL/uL — ABNORMAL LOW (ref 4.22–5.81)
RDW: 14.8 % (ref 11.5–15.5)
WBC: 13.2 10*3/uL — ABNORMAL HIGH (ref 4.0–10.5)

## 2012-07-03 MED ORDER — OXYCODONE-ACETAMINOPHEN 7.5-325 MG PO TABS: 1.0000 | ORAL_TABLET | Freq: Four times a day (QID) | ORAL | Status: DC | PRN

## 2012-07-03 MED ORDER — NICOTINE 7 MG/24HR TD PT24: 1.0000 | MEDICATED_PATCH | Freq: Every day | TRANSDERMAL | Status: DC

## 2012-07-03 MED ORDER — MUPIROCIN 2 % EX OINT: TOPICAL_OINTMENT | Freq: Two times a day (BID) | CUTANEOUS | Status: DC

## 2012-07-03 MED ORDER — INSULIN GLARGINE 100 UNIT/ML ~~LOC~~ SOLN: 6.0000 [IU] | Freq: Every day | SUBCUTANEOUS | Status: DC

## 2012-07-03 MED ORDER — CALCITRIOL 0.25 MCG PO CAPS: 0.2500 ug | ORAL_CAPSULE | Freq: Every day | ORAL | Status: DC

## 2012-07-03 MED ORDER — FUROSEMIDE 80 MG PO TABS: 160.0000 mg | ORAL_TABLET | Freq: Three times a day (TID) | ORAL | Status: DC

## 2012-07-03 MED ORDER — SODIUM POLYSTYRENE SULFONATE 15 GM/60ML PO SUSP
30.0000 g | Freq: Once | ORAL | Status: AC
  Administered 2012-07-03: 30 g via ORAL
  Filled 2012-07-03: qty 120

## 2012-07-03 MED ORDER — CALCIUM CARBONATE ANTACID 500 MG PO CHEW: 2.0000 | CHEWABLE_TABLET | Freq: Three times a day (TID) | ORAL | Status: DC

## 2012-07-03 NOTE — Progress Notes (Signed)
Family Medicine Teaching Service Daily Progress Note Service Page: [phone]   Subjective:  Breathing better this am, occasional dyspnea. Some nausea with 2 episodes of vomiting this am.   Objective: Temp:  [97.7 F (36.5 C)-98 F (36.7 C)] 97.9 F (36.6 C) (01/25 0500) Pulse Rate:  [85-96] 91  (01/25 0500) Resp:  [18-20] 20  (01/25 0500) BP: (127-141)/(62-74) 138/70 mmHg (01/25 0500) SpO2:  [94 %-98 %] 96 % (01/25 0805)  Filed Weights   06/30/12 0656 07/01/12 0640 07/02/12 0307  Weight: 198 lb 1.6 oz (89.858 kg) 197 lb 12.8 oz (89.721 kg) 198 lb 14.4 oz (90.22 kg)    Intake/Output Summary (Last 24 hours) at 07/03/12 0901 Last data filed at 07/03/12 3086  Gross per 24 hour  Intake    720 ml  Output   1650 ml  Net   -930 ml   Physical Exam: Gen:  NAD. Lying in bed CV: RRR, no murmurs, rubs, or gallops. Resp: CTAB. No rales, rhonchi, or wheezing appreciated. Abd: Soft, nontender, nondistended. No appreciable organomegaly. Ext: 2+ pitting LE edema.  R foot with clean dry intact bandage covering recent surgical site of removal 4th and 5th digit Neuro: Alert and oriented, No gross deficits GU: 5 mm dorsal hypopigmented ulceration on foreskin, Resolved ventral papule also now on tender to palpation. Catheter in place.   CBC BMET   Lab 07/03/12 0622 07/02/12 0510 07/01/12 0605  WBC 13.2* 11.4* 13.2*  HGB 9.4* 8.3* 8.5*  HCT 30.3* 26.4* 26.6*  PLT 372 320 311    Lab 07/03/12 0622 07/02/12 0510 07/01/12 0605  NA 144 143 144  K 5.3* 5.0 4.7  CL 100 101 102  CO2 30 30 23   BUN 100* 102* 105*  CREATININE 5.26* 5.62* 5.83*  GLUCOSE 156* 190* 173*  CALCIUM 9.6 8.9 8.8      06/28/2012 22:06  Iron <10 (L)  UIBC 178  TIBC Not calculated due to Iron <10.  Saturation Ratios Not calculated due to Iron <10.     Lab 06/29/12 0806 06/29/12 0210 06/28/12 2044  TROPONINI <0.30 <0.30 <0.30   D-Dimer 4.49  Imaging/Diagnostic Tests: CXR 06/28/2012  IMPRESSION:  Cardiomegaly  and mild interstitial prominence likely due to edema.  2D TTE 06/29/2012 Study Conclusions - Left ventricle: The cavity size was normal. There was mild concentric hypertrophy. Systolic function was mildly reduced. The estimated ejection fraction was in the range of 50% to 55%. Although no diagnostic regional wall motion abnormality was identified, this possibility cannot be completely excluded on the basis of this study. Features are consistent with a pseudonormal left ventricular filling pattern, with concomitant abnormal relaxation and increased filling pressure (grade 2 diastolic dysfunction). Doppler parameters are consistent with elevated mean left atrial filling pressure. - Mitral valve: Moderate to severe regurgitation directed centrally. - Left atrium: The atrium was moderately to severely dilated. - Atrial septum: No defect or patent foramen ovale was identified. - Pulmonary arteries: Systolic pressure was moderately to severely increased. PA peak pressure: 64mm Hg (S). - Pericardium, extracardiac: A small pericardial effusion was identified circumferential to the heart.  Assessment/Plan: John [name] is a 57 y.o. year old male with PMH of PVD s/p bypass and toe ampuation, CKD 4, HTN, DM2, and tobacco abuse presenting with new onset dyspnea and hypotension while at [facility]. We have him admitted for fluid overload from  apparent new heart failure and  Acute on Chronic Kidney disease.   Cardio renal syndrome- with Acute on Chronic CKD  stage 4 to 5 contributing to his chronic diastolic CHF.   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, additionally he has COPD - BNP 33K, EKG without ischemic changes, CXR with pulmonary  - Troponins negative times 3 - PO lasix 160 mg PO TID - negative 6.8 L since admission  AKI on CKD4 - fluid overload - Catheter in place so likely not  obstructive, UA with mod LE and large blood- collected from catheter bag - Creatine baseline previously 3 to 4, discharged on 06/24/2012 with Cre of 4.31  - Cre as high as 6.22 on 1/22 now improved to 5.26 - recently placed AV fistula  - Nephrology consulting- appreciate recommendations  - Switched to PO Lasix - 160 mg TID - Some metabolic acidosis previously and placed on sodium bicarb at that time.  Continuing to receive Bicarb (per renal) - Hyperphosphatemia- phosphate binders per nephro - secondary hyperparathyroidism- PTH 327.2, Calcitriol per nephro  COPD - Duonebs TID with Albuterol Q6 PRN - Will discharge on Spiriva  PVD  - Amputation and bypass surgery during recent admission  - PO cipro finished with last dose on 1/22  Anemia  - No source of obvious bleed, hemmocult to be collected - Hgb stable at 9.4 - Likely anemia of chronic renal disease, also iron deficiency with iron less than 10 - On arinesp, has received ferraheme 1020mg  - continue to monitor  Prostatic hyperplasia  - Prostate found to be large on last admission with post void residuals of 1L X2  - Coude catheter in place, has f/u with urology arranged OP - Will continue flomax  Penile sore- improved - Mupirocin BID, day 4 - plan to continue for 7 days, follow for improvement  DM2  - CBG 120-229 - Lantus 6 Units qd - Will continue sensitive SSI and QHS coverage  HTN  - Well controlled currently.  - Will continue Toprol XL 25 mg daily  Smoking Cessation  - from previous admission patient wants to quit, continue nicotine patch   FEN/GI: Carb modified, Saline lock IV Prophylaxis: Lovenox  Disposition: possibly home today or tomorrow with [facility] PT/RN, will follow for nephrology recommendations Code Status: Full code  Kevin Fenton, MD 07/03/2012, 9:01 AM

## 2012-07-03 NOTE — Discharge Summary (Signed)
Family Medicine [facility] Discharge Summary  Patient name: John [name] Medical record number: [id number] Date of birth: Jul 29, 1955 Age: 57 y.o. Gender: male Date of Admission: 06/28/2012  Date of Discharge: 07/03/2012 Admitting Physician: Tobin Chad, MD  Primary Care Provider: [name], MD  Indication for Hospitalization: Dyspnea, hypotension Discharge Diagnoses:  1. Cardiorenal Syndrome 2. Acute Kidney injury on CKD 4 to 5  3. Acute exacerbation of likely Chronic Diastolic CHF 4. COPD 5. PVD s/p bypass surgery and amputation 6.  Anemia of chronic renal disease 7. BPH 8. Penile lesion 9. DM2 10. HTN 11. Tobacco abuse  Consultations: Nephrology  Significant Labs and Imaging:    Lab 07/03/12 0622 07/02/12 0510 07/01/12 0605  HGB 9.4* 8.3* 8.5*  HCT 30.3* 26.4* 26.6*  WBC 13.2* 11.4* 13.2*  PLT 372 320 311    Lab 07/03/12 0622 07/02/12 0510 07/01/12 0605 06/30/12 0700 06/29/12 1345  NA 144 143 144 144 --  K 5.3* 5.0 -- -- --  CL 100 101 102 104 --  CO2 30 30 23 21  --  GLUCOSE 156* 190* 173* 105* --  BUN 100* 102* 105* 105* --  CREATININE 5.26* 5.62* 5.83* 6.22* --  CALCIUM 9.6 8.9 8.8 8.4 7.9*  MG -- -- -- -- --  PHOS 5.4* 6.0* 6.8* 7.8* --     Lab 06/29/12 0806 06/29/12 0210 06/28/12 2044  TROPONINI <0.30 <0.30 <0.30    06/28/2012 15:23  Pro B Natriuretic peptide (BNP) 33516.0 (H)    CXR 06/28/2012  IMPRESSION:  Cardiomegaly and mild interstitial prominence likely due to edema.   2D TTE 06/29/2012  Study Conclusions - Left ventricle: The cavity size was normal. There was mild concentric hypertrophy. Systolic function was mildly reduced. The estimated ejection fraction was in the range of 50% to 55%. Although no diagnostic regional wall motion abnormality was identified, this possibility cannot be completely excluded on the basis of this study. Features are consistent with a pseudonormal left ventricular filling pattern, with  concomitant abnormal relaxation and increased filling pressure (grade 2 diastolic dysfunction). Doppler parameters are consistent with elevated mean left atrial filling pressure. - Mitral valve: Moderate to severe regurgitation directed centrally. - Left atrium: The atrium was moderately to severely dilated. - Atrial septum: No defect or patent foramen ovale was identified. - Pulmonary arteries: Systolic pressure was moderately to severely increased. PA peak pressure: 64mm Hg (S). - Pericardium, extracardiac: A small pericardial effusion was identified circumferential to the heart.  Procedures: None  Brief Hospital Course:  RITO LECOMTE is a 58 y.o. year old male with PMH of PVD s/p bypass and toe ampuation, CKD 4, HTN, DM2, and tobacco abuse presenting with new onset dyspnea and hypotension while at [facility]. We feel his dyspnea is due to cardio renal syndrome with Acute Kidney Injury on CKD 4 to 5 exacerbating an underlying dCHF.   Cardiorenal syndrome He presented with dyspnea clearly due to fluid overload with pulmonary edema on CXR and an elevated BNP. He had AKI on his know CKD 4 to 5 and an echo revealed grade 2 diastolic dysfunction. We admitted him for IV diuresis and O2 support. We ruled out MI with a cycle of troponins and EKGs lacking ischemic changes. We diuresed him with 8o mg of IV lasix Q6 hours for 4 days. He was transitioned to 160 mg PO lasix TID prior to dthe day of discharge and continued to diurese well. He was net negative 6.6 L for the admission.  AKI on CKD4  Clearly contributing to his dyspneic state. His creatinine worsened to a max of 6.22 on 06/30/2012 and improved to 5.26 on discharge. Nephrology was consulted who reccommended the diuretic regimen that was used and managed the complications of his kidney failure. He was started on calitriol after  A PTH level of 327 was obtained. He was also continued on sodium bicarb for metabolic acidosis and calcium carbonate.  Dialyses was not needed during admission but was considered and will likely be needed in the future. An Av graft was placed in his prior hospitalization.   COPD Also contributing to his dyspnea, we treated him with duonebs and continued his albuterol at dc.   PVD  He had an amputation and bypass surgery during a recent admission. He finished a coarse of PO cipro previously started by vascular surgery after his bypass surgery. No new complications were found during admission.   Anemia  Likely anemia of chronic renal disease. His hemoglobin dipped to 7.1 on the day after admission and he was transfused 1 unit of PRBC. He was found to be severely iron deficient and was given IV ferraheme. He was continued on his previously started arinesp, and his Hgb was stable at 9.4 on the day of discharge.   BPH On his previous admission his prostate was found to be large and smooth and he had two post void residuals of 1 L. A RUS was obtained at that time that did not show any hydronephrosis. Urology was contacted on his previous admission and recommended coude catheter placement and OP f/u. He returned with the catheter in place and missed his f/u appt as he was re-admitted. Urology was again contacted and confirmed they felt the catheter was necessary and would see him at follow up after his hospitalization. They felt it was fine to have it in place for up to 1 month.   Penile lesion 5 mm hypopigmented lesion on his foreskin and additional 3 mm papule on ventral surface of the glans that was tender and had purulent drainage. We treated him with mupirocin ointment BID and felt that the painful one on his ventral glans had healed before discharge. He had instructions to continue it for a total of 7 days treatment.    DM2  Treated inpatient with a reduced dose of lantus ( 5 initially and then 6 units daily) and a SSI. He was restarted on his scheduled novoog and kept at 6 units of lantus on dc.   HTN    Hypotensive initially to 80/40 at SNF and low here at 110-127/69-77. We held his amlodipine and metoprolol. Metoprolol was restarted at his previous dose but amlodipine was held at dc.   Smoking Cessation  He continues to want to quit and requested a nicotine patch rx at dc.   Chronic pain Continued on percocet throughout admission. Given a small Rx of 20 percocet at discharge.   Dispo: Home with [facility] PT and RN.   Discharge Medications:    Medication List     As of 07/03/2012  9:43 PM    STOP taking these medications         amLODipine 10 MG tablet   Commonly known as: NORVASC      ciprofloxacin 500 MG tablet   Commonly known as: CIPRO      CITRACAL + D PO      oxyCODONE 5 MG immediate release tablet   Commonly known as: Oxy IR/ROXICODONE  TAKE these medications         aspirin 81 MG chewable tablet   Chew 81 mg by mouth daily.      baclofen 10 MG tablet   Commonly known as: LIORESAL   Take 1 tablet (10 mg total) by mouth 2 (two) times daily.      calcitRIOL 0.25 MCG capsule   Commonly known as: ROCALTROL   Take 1 capsule (0.25 mcg total) by mouth daily.      calcium carbonate 500 MG chewable tablet   Commonly known as: TUMS - dosed in mg elemental calcium   Chew 2 tablets (400 mg of elemental calcium total) by mouth 3 (three) times daily with meals.      darbepoetin 100 MCG/0.5ML Soln   Commonly known as: ARANESP   Inject 0.5 mLs (100 mcg total) into the skin every Monday.      furosemide 80 MG tablet   Commonly known as: LASIX   Take 2 tablets (160 mg total) by mouth 3 (three) times daily.      insulin glargine 100 UNIT/ML injection   Commonly known as: LANTUS   Inject 6 Units into the skin daily.      insulin lispro 100 UNIT/ML injection   Commonly known as: HUMALOG   Inject 5 Units into the skin 2 (two) times daily before lunch and supper. Per sliding scale.      ketoconazole 2 % cream   Commonly known as: NIZORAL   Apply topically daily. To  feet      metoprolol succinate 25 MG 24 hr tablet   Commonly known as: TOPROL-XL   Take 1 tablet (25 mg total) by mouth daily.      mupirocin ointment 2 %   Commonly known as: BACTROBAN   Apply topically 2 (two) times daily. Until 07/07/2012, to foreskin and head of penis      nicotine 7 mg/24hr patch   Commonly known as: NICODERM CQ - dosed in mg/24 hr   Place 1 patch onto the skin daily.      nicotine 7 mg/24hr patch   Commonly known as: NICODERM CQ - dosed in mg/24 hr   Place 1 patch onto the skin daily.      oxyCODONE-acetaminophen 7.5-325 MG per tablet   Commonly known as: PERCOCET   Take 1 tablet by mouth every 6 (six) hours as needed for pain.      pravastatin 40 MG tablet   Commonly known as: PRAVACHOL   Take 1 tablet (40 mg total) by mouth daily.      simvastatin 80 MG tablet   Commonly known as: ZOCOR   Take 80 mg by mouth at bedtime.      sodium bicarbonate 650 MG tablet   Take 1 tablet (650 mg total) by mouth 2 (two) times daily.      sodium polystyrene 15 GM/60ML suspension   Commonly known as: KAYEXALATE   Take 15 g by mouth See admin instructions. Takes every 3-4 days      Tamsulosin HCl 0.4 MG Caps   Commonly known as: FLOMAX   Take 2 capsules (0.8 mg total) by mouth daily.      traZODone 50 MG tablet   Commonly known as: DESYREL   Take 1 tablet (50 mg total) by mouth at bedtime as needed for sleep.       Issues for Follow Up:  - Respiaratory and fluid status, he has newly found Diatsolic Heart failure and CKD 4  to 5.  - Need of catheter, on previous admission had persistent large post void residuals and a large prostate.  - Confirm resolution of lesion on penis - DM control as we decreased his Lantus dose while inpatient.  - Ensure vascular surgery follow up, He saw Dr. Edilia Bo previously.   Outstanding Results: None  Discharge Instructions: Please refer to Patient Instructions section of EMR for full details.  Patient was counseled important  signs and symptoms that should prompt return to medical care, changes in medications, dietary instructions, activity restrictions, and follow up appointments.       Follow-up Information    Follow up with [facility]. [facility] Nurse for Heart Failure, Physical Therapy, Occupational Therapy, and Aide)    Contact information:   [phone]      Follow up with Denna Haggard, NP. On 07/13/2012. (at 830, urology)    Contact information:   [address] [place] [address] [phone]       Follow up with MATTINGLY,MICHAEL T, MD. Schedule an appointment as soon as possible for a visit in 1 week.   Contact information:   [address] [place] [address] [phone]       Follow up with [name], MD. On 07/07/2012. (Scheduled at 1015)    Contact information:   [address] [place] [address] [phone]          Discharge Condition: Carlena Sax, MD 07/03/2012, 9:43 PM

## 2012-07-03 NOTE — Progress Notes (Signed)
[facility]  Subjective: Occasional shortness of breath. Nausea resolved. States that he has had nausea and vomiting, intermittently for years.  Ambulating with oxygen. Making urine No acute concerns   Objective: Vital signs in last 24 hours: Blood pressure 141/62, pulse 86, temperature 97.8 F (36.6 C), temperature source Oral, resp. rate 18, height 5\' 11"  (1.803 m), weight 198 lb 14.4 oz (90.22 kg), SpO2 96.00%.  PHYSICAL EXAM Gen: NAD, laying in bed comfortably Chest: bibasilar crackles bilaterally, Bruits on R chest wall over bypass site Cardio: regular rate and rhythm, S1, S2 normal, 2/6 systolic murmur GI: soft, non-tender; distended Extremities: +1 pitting up to knee on L; 2+/4 on R Left AV fistula: palpable thrill + bruit Neurologic: No focal deficit  Lab Results:   Lab 07/03/12 0622 07/02/12 0510 07/01/12 0605  NA 144 143 144  K 5.3* 5.0 4.7  CL 100 101 102  CO2 30 30 23   BUN 100* 102* 105*  CREATININE 5.26* 5.62* 5.83*  ALB -- -- --  GLUCOSE 156* -- --  CALCIUM 9.6 8.9 8.8  PHOS 5.4* 6.0* 6.8*     Basename 07/03/12 0622 07/02/12 0510  WBC 13.2* 11.4*  HGB 9.4* 8.3*  HCT 30.3* 26.4*  PLT 372 320     Scheduled Meds:    . albuterol  2.5 mg Nebulization TID   And  . ipratropium  0.5 mg Nebulization TID  . aspirin  81 mg Oral Daily  . atorvastatin  40 mg Oral q1800  . baclofen  10 mg Oral BID  . calcitRIOL  0.25 mcg Oral Daily  . calcium carbonate  2 tablet Oral TID WC  . darbepoetin  100 mcg Subcutaneous Q Mon  . enoxaparin (LOVENOX) injection  30 mg Subcutaneous Q24H  . furosemide  160 mg Oral TID  . insulin aspart  0-5 Units Subcutaneous QHS  . insulin aspart  0-9 Units Subcutaneous TID WC  . insulin glargine  6 Units Subcutaneous Daily  . metoprolol succinate  25 mg Oral Daily  . mupirocin ointment   Topical BID  . nicotine  7 mg Transdermal Daily  . sodium bicarbonate  650 mg Oral BID  . sodium chloride  3 mL Intravenous Q12H    . Tamsulosin HCl  0.8 mg Oral Daily   Continuous Infusions:  PRN Meds:.sodium chloride, acetaminophen, albuterol, ipratropium, ondansetron (ZOFRAN) IV, oxyCODONE, sodium chloride, traZODone  Assessment/Plan: John [name] is a 57 y.o. year old male with PMH of PVD s/p bypass and toe ampuation, CKD 4, HTN, DM2, and tobacco abuse presenting with new onset dyspnea and hypotension while at [facility]  # CKD4: creatinine of 3.4 in October 2013. Creatinine on d/c from hospital on 1/16 was 4.31. Currently continues to improve, now 5.26 improved from 5.83 yesterday. Good UOP with 1.6L down in 24hrs.   - Lasix no 160mg  po tid - no acute dialysis needs, nausea improved.  - okay to D/c to home, needs follow up lab check early next week and f/u appointment with Nephrology  # Secondary hyperparathyroid: PTH: 327. - started calcitriol 07/01/12  # Anemia: iron <10 Hemoglobin improved to 8.5.  - s/p 1u pRBC and IV iron x1 - on aranesp  # Fluid overload: CHF vs CKD.  Echo EF: 50-55% with grade 2 diastolic dysf.   # BPH: will be followed by Urology as OP, foley until then  # DM2: per primary # HTN: BP stable. BP meds held.   Overall improved  LOS: 5 days  Andrena Mews, DO [facility] Resident - PGY-2 07/03/2012 9:21 AM  Patient seen and examined and above reviewed.  Agree with assessment and plan as above. OK for discharge home, will arrange f/u in office for CKD, also should have labs done with PCP in next 7-10 days. Call w any questions. Vinson Moselle  MD [employer] [phone] pgr    [phone] cell 07/03/2012, 3:25 PM

## 2012-07-03 NOTE — Progress Notes (Signed)
I examined this patient and discussed the care plan with Dr Bradshaw and the FPTS team and agree with assessment and plan as documented in the progress note above.  

## 2012-07-03 NOTE — Progress Notes (Signed)
Patient discharged home. Discharge instruction read. Patient and wife verbalize understanding.

## 2012-07-03 NOTE — Progress Notes (Signed)
NCM faxed facesheet and notification to [facility] of pt's scheduled d/c today. [facility] contact info on dc instructions. Unit RN will call [facility] DME rep for wheelchair. Amedeo Kinsman RN CCM Case Management [phone]

## 2012-07-04 NOTE — Discharge Summary (Signed)
I examined this patient and discussed the care plan with Dr Bradshaw and the FPTS team and agree with assessment and plan as documented in the discharge note above.  

## 2012-07-05 ENCOUNTER — Encounter: Payer: Self-pay | Admitting: Family Medicine

## 2012-07-05 DIAGNOSIS — N189 Chronic kidney disease, unspecified: Secondary | ICD-10-CM

## 2012-07-05 DIAGNOSIS — N2581 Secondary hyperparathyroidism of renal origin: Secondary | ICD-10-CM | POA: Insufficient documentation

## 2012-07-05 DIAGNOSIS — D631 Anemia in chronic kidney disease: Secondary | ICD-10-CM | POA: Insufficient documentation

## 2012-07-05 DIAGNOSIS — I340 Nonrheumatic mitral (valve) insufficiency: Secondary | ICD-10-CM | POA: Insufficient documentation

## 2012-07-05 DIAGNOSIS — G959 Disease of spinal cord, unspecified: Secondary | ICD-10-CM | POA: Insufficient documentation

## 2012-07-07 ENCOUNTER — Ambulatory Visit (INDEPENDENT_AMBULATORY_CARE_PROVIDER_SITE_OTHER): Payer: Medicare Other | Admitting: Family Medicine

## 2012-07-07 ENCOUNTER — Other Ambulatory Visit: Payer: Medicare Other

## 2012-07-07 ENCOUNTER — Ambulatory Visit: Payer: Medicare Other | Admitting: Vascular Surgery

## 2012-07-07 ENCOUNTER — Encounter: Payer: Self-pay | Admitting: Family Medicine

## 2012-07-07 VITALS — BP 109/51 | HR 85 | Temp 98.0°F | Wt 178.8 lb

## 2012-07-07 DIAGNOSIS — N184 Chronic kidney disease, stage 4 (severe): Secondary | ICD-10-CM

## 2012-07-07 LAB — BASIC METABOLIC PANEL
BUN: 136 mg/dL — ABNORMAL HIGH (ref 6–23)
Chloride: 92 mEq/L — ABNORMAL LOW (ref 96–112)
Glucose, Bld: 166 mg/dL — ABNORMAL HIGH (ref 70–99)
Potassium: 5.4 mEq/L — ABNORMAL HIGH (ref 3.5–5.3)
Sodium: 142 mEq/L (ref 135–145)

## 2012-07-07 MED ORDER — OXYCODONE-ACETAMINOPHEN 7.5-325 MG PO TABS: 1.0000 | ORAL_TABLET | Freq: Four times a day (QID) | ORAL | Status: DC | PRN

## 2012-07-07 MED ORDER — DOCUSATE SODIUM 250 MG PO CAPS: 250.0000 mg | ORAL_CAPSULE | Freq: Two times a day (BID) | ORAL | Status: DC

## 2012-07-07 MED ORDER — OXYCODONE-ACETAMINOPHEN 7.5-325 MG PO TABS: 1.0000 | ORAL_TABLET | Freq: Three times a day (TID) | ORAL | Status: DC | PRN

## 2012-07-07 NOTE — Patient Instructions (Addendum)
As long as your urine output stays between 1 and 2 liters a day (1000 to 2000 ml), I think we are OK. I will call you if the labs are worrisome.  YOU call ME if something comes up. Great to see you out of the hospital!

## 2012-07-07 NOTE — Progress Notes (Signed)
  Subjective:    Patient ID: John [name], male    DOB: 11-20-1955, 57 y.o.   MRN: [id number]  HPI  #1. Followup recent hospitalization. He's taking his medicines regularly. He still feels quite fatigued. He is walking with his walker. He is putting out about 2 L of fluid a day. He is being compliant with his 40 ounces of intake fluid. He continues to lose about 2 pounds a day. He wonders if he is on too much Lasix. #2. Penile lesion was healing well and then over the last couple days it started bleeding a little bit continues with the indwelling catheter. #3. Having some constipation. Was on the stool softener in the hospital. They have decreased his Kayexalate until every third day.  Review of Systems Denies fever, sweats, chills. Appetite is better.    Objective:   Physical Exam Vital signs are reviewed GENERAL: Well-developed male no acute distress. He looks like he has lost a fair amount of weight. He is here today with his wife. CARDIOVASCULAR: Regular rate and rhythm no murmur gallop or rub LUNGS: Clear to auscultation bilaterally GENITALIA: Small hypopigmented area at the meatus is. No sign of bleeding there is some irritation of the skin here no sign of infection. EXTREMITY: He is wearing the CAM walker boot and I did not undress the foot lesion. The distal portion of his toes shows no swelling      Assessment & Plan:  #1. Acute renal failure or chronic renal failure. He's also with renal next week. For the interim I would not change his medicines but will check his potassium today. We'll also really continue to monitor his intake and output. I have given him parameters #2. Penile lesion. I think this is related to irritation from the indwelling catheter. I'll try using some barrier cream on #3. Chronic pain. I refilled his medications today and cautioned him that his blood pressures running a lower than usual so he needs to be very careful about using these and may in fact not  need as much as he previously had. #4. We'll start stool softener. Continue current regimen of Kayexalate

## 2012-07-09 ENCOUNTER — Telehealth: Payer: Self-pay | Admitting: Family Medicine

## 2012-07-09 DIAGNOSIS — N184 Chronic kidney disease, stage 4 (severe): Secondary | ICD-10-CM

## 2012-07-09 NOTE — Telephone Encounter (Signed)
[phone] is an alternate number.  Patient is on Lasix, and his BP is 80/60 and they think the Lasix can be decreased now because she is sure that all of his fluid retention has gone down now.

## 2012-07-13 NOTE — Telephone Encounter (Signed)
Ok I agree Lets decrease the lasix to 160 mg once a day. I would like him to come for a blood check (lab only) in next 1-2 days. Order is in. I want to check his potassium and cratinine. THANKS! [name]

## 2012-07-13 NOTE — Telephone Encounter (Signed)
Spoke with pt,he requested for me to relate Dr [name] message to his wife.per Dr [name] pt was instructed to decrease lasix to 160 mg daily and to rtn for labs.pt's wife voice understanding and also scheduled a lab appt for this Thursday 07/15/12 @3 :00. John [name], [name]

## 2012-07-14 ENCOUNTER — Other Ambulatory Visit: Payer: Medicare Other

## 2012-07-14 ENCOUNTER — Telehealth: Payer: Self-pay | Admitting: *Deleted

## 2012-07-14 ENCOUNTER — Ambulatory Visit: Payer: Medicare Other | Admitting: Neurosurgery

## 2012-07-14 NOTE — Telephone Encounter (Signed)
Thayer Ohm, RN at [facility] calling.  At patient's home and needs to report findings.  Patient has pressure ulcer on buttocks that needs to be treated.  Ulcer has "slough on it with granulation to area---0.1-0.2 cm deep."  Patient also has foot ulcer that "doesn't look good and is 90% sloughed."  Nurse would like to treat pressure ulcer with Saline with Duoderm or Hydrocolloid dressing.  Nurse will also try to get patient an appt for tomorrow with vascular surgeon.  Discussed with Dr. [name].  OK to treat patient per nurse's recommendation.  Gaylene Brooks, RN

## 2012-07-15 ENCOUNTER — Other Ambulatory Visit: Payer: Medicare Other

## 2012-07-15 ENCOUNTER — Telehealth: Payer: Self-pay

## 2012-07-15 DIAGNOSIS — N184 Chronic kidney disease, stage 4 (severe): Secondary | ICD-10-CM

## 2012-07-15 LAB — BASIC METABOLIC PANEL
BUN: 172 mg/dL — ABNORMAL HIGH (ref 6–23)
CO2: 29 mEq/L (ref 19–32)
Glucose, Bld: 72 mg/dL (ref 70–99)
Potassium: 4.5 mEq/L (ref 3.5–5.3)
Sodium: 146 mEq/L — ABNORMAL HIGH (ref 135–145)

## 2012-07-15 NOTE — Progress Notes (Signed)
BMP DONE TODAY John [name] 

## 2012-07-15 NOTE — Telephone Encounter (Signed)
Wife called to report that the [facility] RN has advised to call office and schedule appt. For wound check right foot.  Wife stated that the [facility] RN reports the wound is looking worse with "yellow slough".  Denies any fever/chills.  Wife stated she had been doing the packing of the wound, when [facility] RN not there, and doesn't feel it is any larger.  Scheduled appt. 07/15/12 to have right foot wound check.

## 2012-07-16 ENCOUNTER — Encounter: Payer: Self-pay | Admitting: Family Medicine

## 2012-07-16 ENCOUNTER — Encounter: Payer: Self-pay | Admitting: Neurosurgery

## 2012-07-16 ENCOUNTER — Ambulatory Visit (INDEPENDENT_AMBULATORY_CARE_PROVIDER_SITE_OTHER): Payer: Medicare Other | Admitting: Neurosurgery

## 2012-07-16 VITALS — BP 87/57 | HR 89 | Temp 98.9°F | Resp 14 | Ht 69.5 in | Wt 175.0 lb

## 2012-07-16 DIAGNOSIS — M79609 Pain in unspecified limb: Secondary | ICD-10-CM

## 2012-07-16 DIAGNOSIS — M79671 Pain in right foot: Secondary | ICD-10-CM

## 2012-07-16 DIAGNOSIS — I70219 Atherosclerosis of native arteries of extremities with intermittent claudication, unspecified extremity: Secondary | ICD-10-CM

## 2012-07-16 DIAGNOSIS — Z4889 Encounter for other specified surgical aftercare: Secondary | ICD-10-CM

## 2012-07-16 MED ORDER — COLLAGENASE 250 UNIT/GM EX OINT: TOPICAL_OINTMENT | Freq: Every day | CUTANEOUS | Status: DC

## 2012-07-16 MED ORDER — CEPHALEXIN 500 MG PO CAPS: 500.0000 mg | ORAL_CAPSULE | Freq: Four times a day (QID) | ORAL | Status: DC

## 2012-07-16 NOTE — Progress Notes (Signed)
[facility] [place] PAD/PVD Office Note  CC: Drainage on right foot status post ray amputation Referring Physician: Edilia Bo  History of Present Illness: 57 year old male patient of Dr. Edilia Bo who underwent a ray amputation of his right foot  fourth and fifth digits in January 2014. The patient's wife called the office asking to be seen due to "yellow" drainage. We brought the patient in for evaluation. The patient reports little in the way of pain in his lower extremity. The patient does report some steal syndrome symptoms in his left upper extremity after a brachiocephalic fistula creation in the same month.  PROCEDURE:  1. Right axillofemoral bypass graft with 8 mm entering PTFE graft  2. Right femoral to above-knee popliteal artery bypass graft with 6 mm PTFE graft  3. Ray amputation of right fourth and fifth toe     Past Medical History  Diagnosis Date  . COPD (chronic obstructive pulmonary disease)   . Chronic back pain     Chronic narcotics for this for many years  . Diabetes mellitus   . Hyperlipidemia   . Hypertension   . Foot pain   . Peripheral vascular disease   . Asthma   . Stroke     No residual "Mini strokes"  . Ulcer 1985  . Fracture of foot     Compond- Right  . Chronic kidney disease     CKD stage III  . Neuromuscular disorder     periferal neuropathy    ROS: [x]  Positive   [ ]  Denies    General: [ ]  Weight loss, [ ]  Fever, [ ]  chills Neurologic: [ ]  Dizziness, [ ]  Blackouts, [ ]  Seizure [ ]  Stroke, [ ]  "Mini stroke", [ ]  Slurred speech, [ ]  Temporary blindness; [ ]  weakness in arms or legs, [ ]  Hoarseness Cardiac: [ ]  Chest pain/pressure, [ ]  Shortness of breath at rest [ ]  Shortness of breath with exertion, [ ]  Atrial fibrillation or irregular heartbeat Vascular: [ ]  Pain in legs with walking, [ ]  Pain in legs at rest, [ ]  Pain in legs at night,  [ ]  Non-healing ulcer, [ ]  Blood clot in vein/DVT,   Pulmonary: [ ]  Home oxygen, [ ]   Productive cough, [ ]  Coughing up blood, [ ]  Asthma,  [ ]  Wheezing Musculoskeletal:  [ ]  Arthritis, [ ]  Low back pain, [ ]  Joint pain Hematologic: [ ]  Easy Bruising, [ ]  Anemia; [ ]  Hepatitis Gastrointestinal: [ ]  Blood in stool, [ ]  Gastroesophageal Reflux/heartburn, [ ]  Trouble swallowing Urinary: [ ]  chronic Kidney disease, [ ]  on HD - [ ]  MWF or [ ]  TTHS, [ ]  Burning with urination, [ ]  Difficulty urinating Skin: [ ]  Rashes, [ ]  Wounds Psychological: [ ]  Anxiety, [ ]  Depression   Social History History  Substance Use Topics  . Smoking status: Current Every Day Smoker -- 0.5 packs/day for 40 years    Types: Cigarettes  . Smokeless tobacco: Never Used     Comment: pt states that he is trying to quit  . Alcohol Use: No     Comment: Previous drinker - However, quit in 2006    Family History Family History  Problem Relation Age of Onset  . Diabetes Mother   . Heart disease Mother   . Hypertension Mother   . Heart attack Mother   . Diabetes Father   . Heart disease Father   . Hypertension Father   . Heart attack Father   .  Other Father     bleeding problems  . Diabetes Brother   . Heart disease Brother     Allergies  Allergen Reactions  . Gabapentin Other (See Comments)    Can't walk, shuts legs down  . Vicodin (Hydrocodone-Acetaminophen) Hives and Itching    Current Outpatient Prescriptions  Medication Sig Dispense Refill  . aspirin 81 MG chewable tablet Chew 81 mg by mouth daily.      . baclofen (LIORESAL) 10 MG tablet Take 1 tablet (10 mg total) by mouth 2 (two) times daily.  30 each  1  . calcitRIOL (ROCALTROL) 0.25 MCG capsule Take 1 capsule (0.25 mcg total) by mouth daily.  30 capsule  0  . calcium carbonate (TUMS - DOSED IN MG ELEMENTAL CALCIUM) 500 MG chewable tablet Chew 2 tablets (400 mg of elemental calcium total) by mouth 3 (three) times daily with meals.  180 tablet  0  . darbepoetin (ARANESP) 100 MCG/0.5ML SOLN Inject 0.5 mLs (100 mcg total) into the  skin every Monday.  4.2 mL  0  . docusate sodium (COLACE) 250 MG capsule Take 1 capsule (250 mg total) by mouth 2 (two) times daily.  60 capsule  5  . furosemide (LASIX) 80 MG tablet Take 2 tablets (160 mg total) by mouth daily.  180 tablet  0  . insulin glargine (LANTUS) 100 UNIT/ML injection Inject 6 Units into the skin daily.  10 mL  0  . insulin lispro (HUMALOG) 100 UNIT/ML injection Inject 5 Units into the skin 2 (two) times daily before lunch and supper. Per sliding scale.      Marland Kitchen ketoconazole (NIZORAL) 2 % cream Apply topically daily. To feet  15 g  0  . metoprolol succinate (TOPROL-XL) 25 MG 24 hr tablet Take 1 tablet (25 mg total) by mouth daily.  30 tablet  11  . mupirocin ointment (BACTROBAN) 2 % Apply topically 2 (two) times daily. Until 07/07/2012, to foreskin and head of penis  22 g  0  . nicotine (NICODERM CQ - DOSED IN MG/24 HR) 7 mg/24hr patch Place 1 patch onto the skin daily.  28 patch  0  . nicotine (NICODERM CQ - DOSED IN MG/24 HR) 7 mg/24hr patch Place 1 patch onto the skin daily.  30 patch  0  . oxyCODONE-acetaminophen (PERCOCET) 7.5-325 MG per tablet Take 1 tablet by mouth every 8 (eight) hours as needed for pain.  90 tablet  0  . oxyCODONE-acetaminophen (PERCOCET) 7.5-325 MG per tablet Take 1 tablet by mouth every 6 (six) hours as needed for pain.  120 tablet  0  . pravastatin (PRAVACHOL) 40 MG tablet Take 1 tablet (40 mg total) by mouth daily.  90 tablet  4  . simvastatin (ZOCOR) 80 MG tablet Take 80 mg by mouth at bedtime.      . sodium bicarbonate 650 MG tablet Take 1 tablet (650 mg total) by mouth 2 (two) times daily.  60 tablet  0  . sodium polystyrene (KAYEXALATE) 15 GM/60ML suspension Take 15 g by mouth See admin instructions. Takes every 3-4 days      . Tamsulosin HCl (FLOMAX) 0.4 MG CAPS Take 2 capsules (0.8 mg total) by mouth daily.  60 capsule  0  . traZODone (DESYREL) 50 MG tablet Take 1 tablet (50 mg total) by mouth at bedtime as needed for sleep.  30 tablet  0   . [DISCONTINUED] tiotropium (SPIRIVA HANDIHALER) 18 MCG inhalation capsule Place 1 capsule (18 mcg total) into inhaler and  inhale daily.  30 capsule  2    Physical Examination  Filed Vitals:   07/16/12 1053  BP: 87/57  Pulse: 89  Temp: 98.9 F (37.2 C)  Resp: 14    Body mass index is 25.47 kg/(m^2).  General:  WDWN in NAD Gait: Normal HEENT: WNL Eyes: Pupils equal Pulmonary: normal non-labored breathing , without Rales, rhonchi,  wheezing Cardiac: RRR, without  Murmurs, rubs or gallops; No carotid bruits Abdomen: soft, NT, no masses Skin: no rashes, ulcers noted Vascular Exam/Pulses: Palpable femoral pulses bilaterally, there is no lower extremity pulses palpable, radial pulses are palpable bilaterally  Extremities without ischemic changes, no Gangrene , no cellulitis; no open wounds;  Musculoskeletal: no muscle wasting or atrophy  Neurologic: A&O X 3; Appropriate Affect ; SENSATION: normal; MOTOR FUNCTION:  moving all extremities equally. Speech is fluent/normal  Non-Invasive Vascular Imaging: None  ASSESSMENT/PLAN: I debrided some of the dead tissue out of the wound bed where the fourth and fifth toe ray amputation took place also removed 2 sutures that were on the outside. I instructed the patient is wife to continue the twice a day dressing and I will start him on Santyl in am also going to place patient on Keflex 500 mg twice a day for 10 days. The patient has an appointment scheduled to see Dr. Edilia Bo next Wednesday and I encouraged them to keep this appointment and call our office if conditions worsen. They stated understanding and are in agreement with this plan.  Lauree Chandler ANP  Clinic M.D.: [facility]

## 2012-07-19 ENCOUNTER — Telehealth: Payer: Self-pay | Admitting: *Deleted

## 2012-07-19 NOTE — Telephone Encounter (Signed)
Chris with [facility] calling to let Dr. [name] know that Mr. Beckstead wife has informed her that he does not need a Home Health Aid. Thayer Ohm needs a order faxed to [facility] to stop Home Health Aid.  It can be faxed to [phone].  Ileana Ladd

## 2012-07-20 ENCOUNTER — Encounter: Payer: Self-pay | Admitting: Vascular Surgery

## 2012-07-21 ENCOUNTER — Ambulatory Visit (INDEPENDENT_AMBULATORY_CARE_PROVIDER_SITE_OTHER): Payer: Medicare Other | Admitting: Vascular Surgery

## 2012-07-21 ENCOUNTER — Encounter: Payer: Self-pay | Admitting: Vascular Surgery

## 2012-07-21 VITALS — BP 100/56 | HR 87 | Ht 69.5 in | Wt 170.0 lb

## 2012-07-21 DIAGNOSIS — N184 Chronic kidney disease, stage 4 (severe): Secondary | ICD-10-CM

## 2012-07-21 DIAGNOSIS — I739 Peripheral vascular disease, unspecified: Secondary | ICD-10-CM

## 2012-07-21 DIAGNOSIS — I7025 Atherosclerosis of native arteries of other extremities with ulceration: Secondary | ICD-10-CM | POA: Insufficient documentation

## 2012-07-21 DIAGNOSIS — L98499 Non-pressure chronic ulcer of skin of other sites with unspecified severity: Secondary | ICD-10-CM | POA: Insufficient documentation

## 2012-07-21 NOTE — Progress Notes (Signed)
Vascular [facility]in Specialist of [place]  Patient name: John [name] MRN: [id number] DOB: Feb 02, 1956 Sex: male  REASON FOR VISIT: follow up after right axillofemoral and right fem-tib above-knee pop bypass. Also follow up after left brachiocephalic AV fistula.  HPI: John [name] is a 57 y.o. male who is not yet on dialysis, who had a left brachiocephalic AV fistula on 06/24/2012. He's been having some paresthesias in his left hand in his note the left hand is colder than the right. He symptoms have been relatively stable.  In addition he underwent a right axillofemoral bypass graft and right femoral to above-knee pop 2 artery bypass graft with a prosthetic graft and a Ray amputation of the right fourth and fifth toes on 06/18/2012. His wife in the home health nurse has been doing the dressing change. Comes in for a routine visit.   REVIEW OF SYSTEMS: Arly.Keller ] denotes positive finding; [  ] denotes negative finding  CARDIOVASCULAR:  [ ]  chest pain   [ ]  dyspnea on exertion    CONSTITUTIONAL:  [ ]  fever   [ ]  chills  PHYSICAL EXAM: Filed Vitals:   07/21/12 1104  BP: 100/56  Pulse: 87  Height: 5' 9.5" (1.765 m)  Weight: 170 lb (77.111 kg)  SpO2: 100%   Body mass index is 24.75 kg/(m^2). GENERAL: The patient is a well-nourished male, in no acute distress. The vital signs are documented above. CARDIOVASCULAR: There is a regular rate and rhythm  PULMONARY: There is good air exchange bilaterally without wheezing or rales. Both feet are warm and well-perfused. His incisions are healed to I did some excisional debridement of his open fourth and fifth toe amputation site and there is a reasonable granulation tissue. I debrided some adipose tissue.  MEDICAL ISSUES:  Atherosclerosis of native arteries of the extremities with ulceration(440.23) His right axillofemoral graft and right femoral to above-knee graft are patent with brisk Doppler signals in the right foot. The wound on the lateral  aspect of his foot at the site of toe at the patient has some granulation tissue. I did some excisional debridement in the office today. We'll continue with dressing changes and I'll plan on seeing him back in 3 weeks.  Chronic kidney disease, stage 4, severely decreased GFR He has some mild steal and left upper extremity related to his left brachiocephalic AV fistula. He does have brisk Doppler signals in the radial ulnar and palmar arch signal on the left, although the duodenum and with compression of his fistula. I've encouraged him to continue exercises and I think his symptoms will gradually improve. If they do not the options would be ligation of his fistula, banding of his fistula, or a DRIL procedure. I would be reluctant to consider a DRIL procedure and have to take vein from his leg is given his severe peripheral vascular disease. In addition I have explained the risk of banding the fistula is that oftentimes the fistula is not narrowed enough to relieve the symptoms or then fistula is narrowed so much that the fistula clots. See him back in 3 weeks. He knows to call sooner if he has problems.   DICKSON,CHRISTOPHER S [facility] [place] Beeper: [phone]

## 2012-07-21 NOTE — Assessment & Plan Note (Signed)
His right axillofemoral graft and right femoral to above-knee graft are patent with brisk Doppler signals in the right foot. The wound on the lateral aspect of his foot at the site of toe at the patient has some granulation tissue. I did some excisional debridement in the office today. We'll continue with dressing changes and I'll plan on seeing him back in 3 weeks.

## 2012-07-21 NOTE — Assessment & Plan Note (Signed)
He has some mild steal and left upper extremity related to his left brachiocephalic AV fistula. He does have brisk Doppler signals in the radial ulnar and palmar arch signal on the left, although the duodenum and with compression of his fistula. I've encouraged him to continue exercises and I think his symptoms will gradually improve. If they do not the options would be ligation of his fistula, banding of his fistula, or a DRIL procedure. I would be reluctant to consider a DRIL procedure and have to take vein from his leg is given his severe peripheral vascular disease. In addition I have explained the risk of banding the fistula is that oftentimes the fistula is not narrowed enough to relieve the symptoms or then fistula is narrowed so much that the fistula clots. See him back in 3 weeks. He knows to call sooner if he has problems.

## 2012-07-28 ENCOUNTER — Ambulatory Visit (INDEPENDENT_AMBULATORY_CARE_PROVIDER_SITE_OTHER): Payer: Medicare Other | Admitting: Vascular Surgery

## 2012-07-28 ENCOUNTER — Telehealth: Payer: Self-pay

## 2012-07-28 ENCOUNTER — Encounter: Payer: Self-pay | Admitting: Vascular Surgery

## 2012-07-28 ENCOUNTER — Encounter (INDEPENDENT_AMBULATORY_CARE_PROVIDER_SITE_OTHER): Payer: Medicare Other | Admitting: *Deleted

## 2012-07-28 VITALS — BP 88/57 | HR 84 | Ht 69.5 in | Wt 168.0 lb

## 2012-07-28 DIAGNOSIS — M79609 Pain in unspecified limb: Secondary | ICD-10-CM

## 2012-07-28 DIAGNOSIS — I739 Peripheral vascular disease, unspecified: Secondary | ICD-10-CM

## 2012-07-28 NOTE — Assessment & Plan Note (Signed)
His right axillofemoral bypass graft and right femoropopliteal bypass graft are patent. I think the circulation to the right foot is as good as it can get. There is some fibrin tissue at the base of the wound then I think we will change back to wet-to-dry dressing changes to try to clean this up. Once the wound is largely granulation tissue I think he might potentially benefit from a VAC. He has an appointment on March 5 and he will keep that appointment.

## 2012-07-28 NOTE — Progress Notes (Signed)
[facility] Specialist of [place]  Patient name: John [name] MRN: [id number] DOB: [birth year] Sex: male  REASON FOR VISIT: pain in right foot.  HPI: John [name] is a 57 y.o. male who was seen as an add-on today with pain in his right foot. He states that this has been present for a week. In addition, the home health nurse reported that the wound looked "pale". Therefore he was brought on to the office for a duplex of his graft in an office visit. He states that he has had some pain in the right foot for about a week. There was no sudden onset of pain. He denies fever or chills. His wife has been doing dressing changes with hydrogel.   REVIEW OF SYSTEMS: Arly.Keller ] denotes positive finding; [  ] denotes negative finding  CARDIOVASCULAR:  [ ]  chest pain   [ ]  dyspnea on exertion    CONSTITUTIONAL:  [ ]  fever   [ ]  chills  PHYSICAL EXAM: Filed Vitals:   07/28/12 1548  BP: 88/57  Pulse: 84  Height: 5' 9.5" (1.765 m)  Weight: 168 lb (76.204 kg)  SpO2: 100%   Body mass index is 24.46 kg/(m^2). GENERAL: The patient is a well-nourished male, in no acute distress. The vital signs are documented above. CARDIOVASCULAR: There is a regular rate and rhythm  PULMONARY: There is good air exchange bilaterally without wheezing or rales. The right foot appears adequately perfused. There is a reasonable granulation tissue in the wound where he had 2 toes amputated. There is some fatty tissue. Ther is no significant drainage or erythema.  MEDICAL ISSUES:  Atherosclerosis of native arteries of the extremities with ulceration(440.23) His right axillofemoral bypass graft and right femoropopliteal bypass graft are patent. I think the circulation to the right foot is as good as it can get. There is some fibrin tissue at the base of the wound then I think we will change back to wet-to-dry dressing changes to try to clean this up. Once the wound is largely granulation tissue I think he might potentially  benefit from a VAC. He has an appointment on March 5 and he will keep that appointment.  As he was leaving, he also complained of some swelling in the left neck. On exam I do not appreciate any significant swelling in the left neck were he has had previous left carotid endarterectomy. However when he returns for his normal visit on 08/11/2012 we will obtain a carotid duplex scan.     Joseph Johns S [facility] of [place] Beeper: [phone]

## 2012-07-28 NOTE — Telephone Encounter (Signed)
[facility] RN called to report worsening of right foot.  States the wound bed at the toe amp site of (R) 4th, 5th toes is very pale.  States there is discoloration of the bottom of right foot, and pt. C/o "excruciating pain in bottom of foot".  Discussed w/ Dr. Edilia Bo.   Rec'd v.o. For limited duplex to check patency of bypass graft, and office visit this afternoon.  Notified pt.  Agreed to appt. Today.

## 2012-07-29 NOTE — Telephone Encounter (Signed)
Done John [name]  

## 2012-07-30 NOTE — Addendum Note (Signed)
Addended by: Sharee Pimple on: 07/30/2012 08:43 AM   Modules accepted: Orders

## 2012-08-04 ENCOUNTER — Encounter: Payer: Self-pay | Admitting: Family Medicine

## 2012-08-04 ENCOUNTER — Ambulatory Visit (INDEPENDENT_AMBULATORY_CARE_PROVIDER_SITE_OTHER): Payer: Medicare Other | Admitting: Family Medicine

## 2012-08-04 VITALS — BP 101/58 | HR 85 | Temp 98.0°F | Ht 69.5 in | Wt 168.8 lb

## 2012-08-04 DIAGNOSIS — I70219 Atherosclerosis of native arteries of extremities with intermittent claudication, unspecified extremity: Secondary | ICD-10-CM

## 2012-08-04 MED ORDER — OXYCODONE-ACETAMINOPHEN 7.5-325 MG PO TABS: 1.0000 | ORAL_TABLET | Freq: Four times a day (QID) | ORAL | Status: DC | PRN

## 2012-08-04 MED ORDER — FUROSEMIDE 80 MG PO TABS: 80.0000 mg | ORAL_TABLET | Freq: Every day | ORAL | Status: DC

## 2012-08-04 NOTE — Progress Notes (Signed)
  Subjective:    Patient ID: John [name], male    DOB: 1956-03-20, 57 y.o.   MRN: [id number]  HPI #1. Followup recent hospitalizations #1. Chronic renal disease. He's been followed by the nephrologist. They have decreased his Lasix to once a day. He is feeling well on this medication regimen #2. Diabetes mellitus. Following a pretty good diet. Overall since she's been in the hospital the last 2 time she's lost about 30 pounds. Not having any episodes of low blood sugar. #3. Lower extremity ischemic disease being followed by vascular surgeon. He seems stable at this point #4. Chronic pain: Needs refill his pain medicines. Her continue to work fairly well for him. No side effects such as constipation or drowsiness.   Review of Systems Denies chest pain, new shortness of breath. Energy levels pretty well. Appetite okay. He has lost some weight but is stabilized.    Objective:   Physical Exam  Vital signs are reviewed GENERAL: Well-developed male no acute distress using a walker and he is in a CAM walker boot ambulating without assistance. CARDIO vascular: Regular rate and rhythm LUNGS: Clear to auscultation bilaterally NEURO: No new gross focal deficit. It PSYCH: Alert and oriented x4. Interactive.      Assessment & Plan:

## 2012-08-04 NOTE — Assessment & Plan Note (Signed)
Continues to followed regularly by Dr. Durwin Nora. At this time it doesn't sound like they're planning any new interventions.

## 2012-08-06 ENCOUNTER — Telehealth: Payer: Self-pay | Admitting: *Deleted

## 2012-08-06 NOTE — Telephone Encounter (Signed)
Spoke with Farrel Conners  from [facility] gave her order per Dr [name] to apply the Duoderm.she voiced understanding. Tayshaun Kroh, [name]

## 2012-08-06 NOTE — Telephone Encounter (Signed)
Ramiro Harvest from [facility] calling.  Patient has a new Stage II pressure ulcer on his sacrum.  Needs orders to apply Duoderm on new ulcer as well as to current Stage II ulcer on right buttock.  Will route note to Dr. [name] and call [facility] back.  Gaylene Brooks, RN

## 2012-08-06 NOTE — Telephone Encounter (Signed)
So please give the order for duoderm THANKS! [name]

## 2012-08-10 ENCOUNTER — Encounter: Payer: Self-pay | Admitting: Vascular Surgery

## 2012-08-11 ENCOUNTER — Ambulatory Visit (INDEPENDENT_AMBULATORY_CARE_PROVIDER_SITE_OTHER): Payer: Medicare Other | Admitting: Vascular Surgery

## 2012-08-11 ENCOUNTER — Other Ambulatory Visit (INDEPENDENT_AMBULATORY_CARE_PROVIDER_SITE_OTHER): Payer: Medicare Other

## 2012-08-11 ENCOUNTER — Ambulatory Visit: Payer: Medicare Other | Admitting: Vascular Surgery

## 2012-08-11 ENCOUNTER — Encounter: Payer: Self-pay | Admitting: Vascular Surgery

## 2012-08-11 VITALS — BP 105/58 | HR 71 | Resp 16 | Ht 71.0 in | Wt 175.0 lb

## 2012-08-11 NOTE — Progress Notes (Signed)
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 Also, follow up after left brachiocephalic AV fistula.  HPI: John [name] is a 57 y.o. 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 He had evidence of multilevel arterial occlusive disease. It was felt that his only chance for limb salvage was right axillofemoral bypass grafting and right femoropopliteal bypass grafting. As was performed on 06/18/2012. In addition, he has a history of renal insufficiency and is not yet on dialysis. During this admission, on 06/24/2012, he underwent left brachiocephalic AV fistula placement. He's in for routine follow up.    REVIEW OF SYSTEMS: Arly.Keller ] denotes positive finding; [  ] denotes negative finding  CARDIOVASCULAR:  [ ]  chest pain   [ ]  dyspnea on exertion    CONSTITUTIONAL:  [ ]  fever   [ ]  chills  PHYSICAL EXAM: Filed Vitals:   08/11/12 1550  BP: 105/58  Pulse: 71  Resp: 16  Height: 5\' 11"  (1.803 m)  Weight: 175 lb (79.379 kg)  SpO2: 100%   Body mass index is 24.42 kg/(m^2). GENERAL: The patient is a well-nourished male, in no acute distress. The vital signs are documented above. CARDIOVASCULAR: There is a regular rate and rhythm  PULMONARY: There is good air exchange bilaterally without wheezing or rales. He has a palpable right femoral pulse. He has a good thrill in his left upper arm AV fistula. He has a monophasic radial ulnar and palmar arch signal with the Doppler in the left hand.  I did some excisional debridement of his wound in the office today at the toe amputation site which is open. There is a reasonable granulation tissue and no significant drainage.  He also had undergone a left carotid endarterectomy with Dacron patch angioplasty in  July of 2013. Interpreted his carotid duplex scan today which shows a widely patent left carotid endarterectomy site with no significant stenosis on the right.  MEDICAL ISSUES:  Atherosclerosis of native arteries of the extremities with ulceration(440.23) His right axillofemoral bypass and right femoropopliteal bypass graft are patent. The wound appears to be granulating reasonably well. His wife will continue twice a day wet to dry dressing changes. I'll see him back in 2 weeks. If this cleans up it does not contract he could be considered for a VAC.   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 This is widely patent with no significant right carotid stenosis. He will be due for a follow carotid duplex scan in 1 year.  Chronic kidney disease, stage 4, severely decreased GFR This fistula appears to maturing adequately. He does have symptoms of mild steal. I've encouraged him to continue exercises and hopefully this will improve with time. Given his diffuse vascular disease I do not think he would be a good candidate for a DRIL procedure. If his symptoms worsen I think he would require ligation of his fistula.   DICKSON,CHRISTOPHER S [facility] of [place] Beeper: [phone]

## 2012-08-11 NOTE — Assessment & Plan Note (Signed)
This fistula appears to maturing adequately. He does have symptoms of mild steal. I've encouraged him to continue exercises and hopefully this will improve with time. Given his diffuse vascular disease I do not think he would be a good candidate for a DRIL procedure. If his symptoms worsen I think he would require ligation of his fistula.

## 2012-08-11 NOTE — Assessment & Plan Note (Signed)
His right axillofemoral bypass and right femoropopliteal bypass graft are patent. The wound appears to be granulating reasonably well. His wife will continue twice a day wet to dry dressing changes. I'll see him back in 2 weeks. If this cleans up it does not contract he could be considered for a VAC.

## 2012-08-11 NOTE — Assessment & Plan Note (Signed)
The patient is status post left carotid endarterectomy. This is widely patent with no significant right carotid stenosis. He will be due for a follow carotid duplex scan in 1 year.

## 2012-08-12 ENCOUNTER — Encounter (HOSPITAL_COMMUNITY): Payer: Self-pay

## 2012-08-24 ENCOUNTER — Encounter: Payer: Self-pay | Admitting: Vascular Surgery

## 2012-08-25 ENCOUNTER — Ambulatory Visit (INDEPENDENT_AMBULATORY_CARE_PROVIDER_SITE_OTHER): Payer: Medicare Other | Admitting: Vascular Surgery

## 2012-08-25 ENCOUNTER — Encounter: Payer: Self-pay | Admitting: Vascular Surgery

## 2012-08-25 VITALS — BP 144/67 | HR 94 | Ht 71.0 in | Wt 181.0 lb

## 2012-08-25 DIAGNOSIS — I739 Peripheral vascular disease, unspecified: Secondary | ICD-10-CM

## 2012-08-25 NOTE — Progress Notes (Signed)
Vascular and Vein Specialist of [place]  Patient name: John [name] MRN: [id number] DOB: Sep 26, 1955 Sex: male  REASON FOR VISIT: follow up of right fourth and fifth toe amputation wound.  HPI: John [name] is a 57 y.o. 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 He had multilevel arterial occlusive disease and underwent a right axillofemoral bypass graft and a right femoral popliteal artery bypass graft on 06/18/2012. He also had amputation of the right fourth and fifth toes. In addition he had a left brachiocephalic AV fistula placed. When I saw him last week we debrided the wound some where he has an open wound on his right foot. He appeared to have reasonable granulation tissue. He comes in for a routine follow up visit. In addition he had some mild steal symptoms in his left arm and were following this.  History of symptoms in the left hand have improved although he still has some persistent mild paresthesias in the left hand. He has not noted any significant drainage from the wound on his right foot.  REVIEW OF SYSTEMS: Arly.Keller ] denotes positive finding; [  ] denotes negative finding  CARDIOVASCULAR:  [ ]  chest pain   [ ]  dyspnea on exertion    CONSTITUTIONAL:  [ ]  fever   [ ]  chills  PHYSICAL EXAM: Filed Vitals:   08/25/12 1558  BP: 144/67  Pulse: 94  Height: 5\' 11"  (1.803 m)  Weight: 181 lb (82.101 kg)  SpO2: 100%   Body mass index is 25.26 kg/(m^2). GENERAL: The patient is a well-nourished male, in no acute distress. The vital signs are documented above. CARDIOVASCULAR: There is a regular rate and rhythm  PULMONARY: There is good air exchange bilaterally without wheezing or rales. His incisions are healed nicely. He has an open wound on his right fourth and fifth toe amputation site which has good granulation tissue. There was a small amount of fiber and exited which I debrided in the office today. The wound measures 4 cm in length by 1.5 cm in  width by 0.8 cm in depth.  MEDICAL ISSUES: I think the wound in the right foot would benefit from placement of a VAC. I think this will give him his best chance of healing this wound.  In addition we will continue to follow the left brachiocephalic AV fistula. Does steal symptoms are stable. He is not yet on dialysis.  Kejon Feild S [facility] Specialists of [place] Beeper: [phone]

## 2012-08-26 ENCOUNTER — Telehealth: Payer: Self-pay | Admitting: Family Medicine

## 2012-08-26 NOTE — Telephone Encounter (Signed)
Saw patient this am.  Gaining weight past couple days.  Noticed +2 edema bilat lower extremity that patient did not have on Monday.  Weight was 177 on Tues and now 180.  Want to give you heads up.  Need to confer with her. Can reach at [phone]

## 2012-08-26 NOTE — Telephone Encounter (Signed)
Spoke with French Ana and she stated that patient's weight was 177.6, 177.5, 176.6, 180 done Monday - Thursday. Gave information to her also about the lasix

## 2012-08-26 NOTE — Telephone Encounter (Signed)
Dear [name] Team 1) plz recheck the numbers on  The weight gain 2. Have him take his lasix twice a day for next three days. Let me know tomorrow how he is doing  With the edema. THANKS! [name]

## 2012-08-27 ENCOUNTER — Telehealth: Payer: Self-pay | Admitting: Family Medicine

## 2012-08-27 NOTE — Telephone Encounter (Signed)
Nurse from [facility] is calling with the patients weight.  This morning his weight is 176.6 at 6 and at 9 is 180, the edema is less than yesterday.

## 2012-08-28 ENCOUNTER — Other Ambulatory Visit: Payer: Self-pay | Admitting: Family Medicine

## 2012-08-30 ENCOUNTER — Telehealth: Payer: Self-pay | Admitting: Family Medicine

## 2012-08-30 NOTE — Telephone Encounter (Signed)
Spoke with French Ana and gave verbal ok

## 2012-08-30 NOTE — Telephone Encounter (Signed)
Nurse is calling requesting verbal ok for recertification for wound care and observation.

## 2012-09-03 ENCOUNTER — Telehealth: Payer: Self-pay | Admitting: Family Medicine

## 2012-09-03 ENCOUNTER — Ambulatory Visit (INDEPENDENT_AMBULATORY_CARE_PROVIDER_SITE_OTHER): Payer: Medicare Other | Admitting: Family Medicine

## 2012-09-03 ENCOUNTER — Encounter: Payer: Self-pay | Admitting: Family Medicine

## 2012-09-03 VITALS — BP 116/70 | HR 99 | Temp 98.2°F | Ht 71.0 in | Wt 181.0 lb

## 2012-09-03 DIAGNOSIS — R112 Nausea with vomiting, unspecified: Secondary | ICD-10-CM

## 2012-09-03 DIAGNOSIS — R197 Diarrhea, unspecified: Secondary | ICD-10-CM

## 2012-09-03 LAB — CBC
HCT: 31.4 % — ABNORMAL LOW (ref 39.0–52.0)
MCHC: 31.5 g/dL (ref 30.0–36.0)
MCV: 89 fL (ref 78.0–100.0)
Platelets: 166 10*3/uL (ref 150–400)
RDW: 15.5 % (ref 11.5–15.5)

## 2012-09-03 LAB — BASIC METABOLIC PANEL
BUN: 86 mg/dL — ABNORMAL HIGH (ref 6–23)
Calcium: 8.2 mg/dL — ABNORMAL LOW (ref 8.4–10.5)
Creat: 3.42 mg/dL — ABNORMAL HIGH (ref 0.50–1.35)

## 2012-09-03 MED ORDER — ONDANSETRON HCL 4 MG PO TABS: 4.0000 mg | ORAL_TABLET | Freq: Three times a day (TID) | ORAL | Status: DC | PRN

## 2012-09-03 NOTE — Patient Instructions (Signed)
You do not look dehydrated on my exam. Continue to stay well hydrated (but not too much because of your kidney issues). Follow up with [place] on Monday if your symptoms have not improved. If you were to not be able to keep down fluids with the medicine we sent in for nausea, then you should come to the hospital or if you were to feel weak, chest pain, shortness of breath, notice blood in your stool. If you were to have regular dark black stools, we may need to get you a colonoscopy.   Looks like it would be reasonable for you to see Dr. [name] soon to catch up on chronic medical issues as well,  Dr. Durene Cal  Health Maintenance Due  Topic Date Due  . Colonoscopy  12/27/2004  . Tetanus/tdap  06/09/2006  . Influenza Vaccine  02/08/2008

## 2012-09-03 NOTE — Telephone Encounter (Signed)
Spoke with [facility] and she advises that patient has had  large watery  dark diarrhea in AM for two days. Also has had nausea and yesterday and  black thick emesis X 1 . No further vomiting. No fever or abdominal pain. Consulted with Dr. Earnest Bailey and she advises for patient to come here  this afternoon for evaluation. Appointment scheduled.

## 2012-09-03 NOTE — Telephone Encounter (Signed)
Is at the home of John [name] and he is having bouts of nausea and watery stools - also said that he threw up and it was a thick black emesis.  Needs to know if there is any orders for her to help him.

## 2012-09-05 DIAGNOSIS — R197 Diarrhea, unspecified: Secondary | ICD-10-CM | POA: Insufficient documentation

## 2012-09-05 NOTE — Progress Notes (Signed)
Subjective:  SDA for diarrhea  1. Diarrhea started Wednesday morning. Patient thinks it is dark brown typicaly. No bright red blood. Thursday morning 1 BM seemed darker but no consistent melena. Hving about 6-8 bowel movements per day for last 2 days. Denies fever/diaphoresiss. Perhaps a slight chill. Not taking kayexalate as he previously did (takes for when he feels his potassium is high). He usually knows when he vomits. He vomited x1 a small amount that seemed rather dark but no obvious blood and no cofee ground emesis. Patient is able to keep liquids down but does complain of slight nausea. No known sick contacts. No weight loss over last 3 days. Incontinent at times but this is a chronic issue.   Patient came in today because home health RN was concerned. Patient and wife state they are not concerned abotu how he is doing but would like something for nausea. He has had similar occurences in the past but typically on kayexalate when that happens.   ROS--See HPI with additions no chest pain, shortness of breath, lightheadedness Past Medical History-Stage V CKD (not yet on dialysis), DM, HLD, CHF  Reviewed problem list.  Medications- reviewed and updated Chief complaint-noted  Objective: BP 116/70  Pulse 99  Temp(Src) 98.2 F (36.8 C) (Oral)  Ht 5\' 11"  (1.803 m)  Wt 181 lb (82.101 kg)  BMI 25.26 kg/m2 Gen: NAD, resting comfortably, good skin turgor HEENT: MMM, false left eye CV: RRR no murmurs rubs or gallops Lungs: CTAB no crackles, wheeze, rhonchi Skin: warm, dry Neuro: grossly normal, moves all extremities  Lab Results  Component Value Date   WBC 10.6* 09/03/2012   HGB 9.9* 09/03/2012   HCT 31.4* 09/03/2012   MCV 89.0 09/03/2012   PLT 166 09/03/2012   Lab Results  Component Value Date   CREATININE 3.42* 09/03/2012    Assessment/Plan:

## 2012-09-05 NOTE — Assessment & Plan Note (Addendum)
Possible viral GI illness though no sick contacts. Hemodynamically stable. Follow up prn.  Checked CBC due to dark stool and emesis x 1 ang hgb stable at baseline Checked creatine due to baseline CKD and actually below baseline . Rx for zofran sent in.   Informed patient by phone on 3/30 of normal results. Patient states diarrhea has essentially resolved.

## 2012-09-08 ENCOUNTER — Ambulatory Visit (INDEPENDENT_AMBULATORY_CARE_PROVIDER_SITE_OTHER): Payer: Medicare Other | Admitting: Family Medicine

## 2012-09-08 ENCOUNTER — Encounter: Payer: Self-pay | Admitting: Family Medicine

## 2012-09-08 VITALS — BP 110/64 | HR 87 | Temp 98.5°F | Ht 71.0 in | Wt 178.6 lb

## 2012-09-08 DIAGNOSIS — E1165 Type 2 diabetes mellitus with hyperglycemia: Secondary | ICD-10-CM

## 2012-09-08 DIAGNOSIS — N184 Chronic kidney disease, stage 4 (severe): Secondary | ICD-10-CM

## 2012-09-08 DIAGNOSIS — E118 Type 2 diabetes mellitus with unspecified complications: Secondary | ICD-10-CM

## 2012-09-08 LAB — POCT GLYCOSYLATED HEMOGLOBIN (HGB A1C): Hemoglobin A1C: 6.7

## 2012-09-08 MED ORDER — OXYCODONE-ACETAMINOPHEN 7.5-325 MG PO TABS: ORAL_TABLET | ORAL | Status: DC

## 2012-09-10 ENCOUNTER — Encounter: Payer: Self-pay | Admitting: Family Medicine

## 2012-09-10 NOTE — Progress Notes (Signed)
Patient ID: John [name], male   DOB: Mar 26, 1956, 57 y.o.   MRN: [id number] Status here for followup of recent hospitalization for his kidney disease. Also needs refill on his pain medicine. He doing quite well. SUBJECTIVE: Bowel sounds are reviewed GENERAL: Well-developed male no acute distress CARDIOVASCULAR: Regular rate and rhythm LUNGS: Clear to auscultation bilaterally assessment #1. Chronic kidney disease stage IV. He is back to his baseline weight holding up well. Will continue to follow closely with his very tenuous fluid status her immediate lab work in his medications.

## 2012-09-14 ENCOUNTER — Encounter: Payer: Self-pay | Admitting: Vascular Surgery

## 2012-09-15 ENCOUNTER — Encounter: Payer: Self-pay | Admitting: Vascular Surgery

## 2012-09-15 ENCOUNTER — Ambulatory Visit (INDEPENDENT_AMBULATORY_CARE_PROVIDER_SITE_OTHER): Payer: Medicare Other | Admitting: Vascular Surgery

## 2012-09-15 VITALS — BP 116/60 | HR 73 | Resp 18 | Ht 69.0 in | Wt 181.0 lb

## 2012-09-15 DIAGNOSIS — L98499 Non-pressure chronic ulcer of skin of other sites with unspecified severity: Secondary | ICD-10-CM

## 2012-09-15 DIAGNOSIS — I70219 Atherosclerosis of native arteries of extremities with intermittent claudication, unspecified extremity: Secondary | ICD-10-CM

## 2012-09-15 DIAGNOSIS — I739 Peripheral vascular disease, unspecified: Secondary | ICD-10-CM

## 2012-09-15 NOTE — Progress Notes (Signed)
Vascular and Vein Specialist of [place]  Patient name: John [name] MRN: [id number] DOB: 04/21/1956 Sex: male  REASON FOR VISIT: Follow up of right fourth and fifth toe amputation site and left upper arm AV fistula  HPI: John [name] is a 57 y.o. 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 He had multilevel arterial occlusive disease. He underwent a right axillofemoral bypass and a right femoral to popliteal artery bypass on 06/18/2012. He had a dictation of the right fourth and fifth toes. In addition he had a left brachiocephalic AV fistula placed during that admission. His had some steal symptoms in the left arm which we have been following. In addition we have been following the wound on his right foot. Last time I saw him on 08/25/2012, we had a VAC placed on the right foot wound. He comes in for a follow up visit.   He continues to have some paresthesias in the left hand states the hand is cold. This has not changed since his last visit. He is not yet on dialysis. He denies fever or chills. He's had no drainage from the right leg wound.  REVIEW OF SYSTEMS: Arly.Keller ] denotes positive finding; [  ] denotes negative finding  CARDIOVASCULAR:  [ ]  chest pain   [ ]  dyspnea on exertion    CONSTITUTIONAL:  [ ]  fever   [ ]  chills  PHYSICAL EXAM: Filed Vitals:   09/15/12 1330  BP: 116/60  Pulse: 73  Resp: 18  Height: 5\' 9"  (1.753 m)  Weight: 181 lb (82.101 kg)   Body mass index is 26.72 kg/(m^2). GENERAL: The patient is a well-nourished male, in no acute distress. The vital signs are documented above. CARDIOVASCULAR: There is a regular rate and rhythm  PULMONARY: There is good air exchange bilaterally without wheezing or rales. The left upper arm AV fistula has a good thrill. He has a radial signal at the left wrist with the Doppler. This augment with compression of his fistula. The wound on the right foot measures 3.5 mm in length by 1 cm in diameter. This has  decreased in size from 4 cm in length by 1.5 cm in diameter at his last visit in March.  MEDICAL ISSUES: With respect to the steal symptoms in his left upper extremity I have again discussed with him the option of ligating his fistula. Given his diffuse vascular disease I do not think he is a good candidate for a DRIL or seizure. Therefore I think really the only option to address the steal would be to ligate the fistula. Unfortunately he does not have any other good options for access short of a left thigh graft. He has an axillofemoral bypass graft on the right and therefore she likely not have access in the right arm. He has had steal symptoms in the left arm. He has a bypass graft in the right leg. This really leave the left side the only remaining option for access. Feel strongly that he does not want to ligate the fistula this time and feels his symptoms are tolerable. We will continue to follow this.  With respect to the wound on his right foot. He seems to be making progress with the VAC. Therefore we will continue this. The foot appeared adequately perfused and is good granulation tissue. We'll see him back in 3 weeks to check on his wound. He knows to call sooner if he has problems.  DICKSON,CHRISTOPHER S Vascular  and Vein Specialists of [place]: [phone]

## 2012-09-27 ENCOUNTER — Telehealth: Payer: Self-pay | Admitting: Family Medicine

## 2012-09-27 NOTE — Telephone Encounter (Signed)
Saw pt today and he has gained 5 lb in a week - is on 40 mg Lasix, trace edema and his vitals are clear - wants to know if there any changes that need to be made.

## 2012-09-28 NOTE — Telephone Encounter (Signed)
Spoke with French Ana from [facility],instructed per Dr [name] to double lasix,she voiced understanding. Sivan Cuello, [name]

## 2012-09-28 NOTE — Telephone Encounter (Signed)
Dear [name] Team I would have him double his lasix for 2-3 days. THANKS! [name]

## 2012-10-04 ENCOUNTER — Telehealth: Payer: Self-pay | Admitting: Family Medicine

## 2012-10-04 NOTE — Telephone Encounter (Signed)
Nurse from [facility] is calling to let Dr. [name] know that he has gained a couple of pounds and the edema is back in his lower extremities.  He is having 2 -3 loose stools every other day and is taking Imodium every other day.  If there is any treatments Dr. [name] would like, please give the nurse a call.

## 2012-10-05 ENCOUNTER — Encounter: Payer: Self-pay | Admitting: Vascular Surgery

## 2012-10-06 ENCOUNTER — Ambulatory Visit (INDEPENDENT_AMBULATORY_CARE_PROVIDER_SITE_OTHER): Payer: Medicare Other | Admitting: Vascular Surgery

## 2012-10-06 ENCOUNTER — Encounter: Payer: Self-pay | Admitting: Vascular Surgery

## 2012-10-06 VITALS — BP 110/60 | HR 73 | Resp 16 | Ht 69.0 in | Wt 184.0 lb

## 2012-10-06 DIAGNOSIS — M79609 Pain in unspecified limb: Secondary | ICD-10-CM

## 2012-10-06 DIAGNOSIS — M79671 Pain in right foot: Secondary | ICD-10-CM

## 2012-10-06 DIAGNOSIS — L98499 Non-pressure chronic ulcer of skin of other sites with unspecified severity: Secondary | ICD-10-CM

## 2012-10-06 NOTE — Progress Notes (Signed)
Vascular and Vein Specialist of [place]  Patient name: John [name] MRN: [id number] DOB: Aug 04, 1955 Sex: male  REASON FOR VISIT: follow up of right foot wound and also steal symptoms and left upper extremity.  HPI: John [name] is a 57 y.o. 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 He had an open wound on his amputation site which is been treated with a VAC. He comes in for a wound check. He has some mild discomfort associated with the wound on his right foot.  In addition during that admission he had placement of a left brachiocephalic AV fistula. He is not yet on dialysis. He has had some steal symptoms but his only remaining option for access would be a left thigh AV graft. We have been following these symptoms carefully. He has refused ligation of the fistula in the past. He symptoms in his left upper extremity have remained stable in may have improved slightly.   REVIEW OF SYSTEMS: Arly.Keller ] denotes positive finding; [  ] denotes negative finding  CARDIOVASCULAR:  [ ]  chest pain   [ ]  dyspnea on exertion    CONSTITUTIONAL:  [ ]  fever   [ ]  chills  PHYSICAL EXAM: Filed Vitals:   10/06/12 0930  BP: 110/60  Pulse: 73  Resp: 16  Height: 5\' 9"  (1.753 m)  Weight: 184 lb (83.462 kg)  SpO2: 100%   Body mass index is 27.16 kg/(m^2). GENERAL: The patient is a well-nourished male, in no acute distress. The vital signs are documented above. CARDIOVASCULAR: There is a regular rate and rhythm  PULMONARY: There is good air exchange bilaterally without wheezing or rales. The wound on his right foot now measures 3 cm in length by 8 mm in width and does continues to slowly contract. There appears to be good granulation tissue. His left upper arm fistula has an excellent bruit and thrill. The hand appeared adequately perfused.  MEDICAL ISSUES: Given these continuing to make progress  with the [facility] WE will continue with this for now. I'll see him back in 3 weeks. With respect to the steal symptoms in his left arm his symptoms are stable and given that he does not have any other good options we will continue to follow this.  Zeina Akkerman S Vascular and Vein Specialists of [place] Beeper: [phone]

## 2012-10-06 NOTE — Telephone Encounter (Signed)
Spoke with patient and he sated that his weigh t is now at 184 lb----it was 181lb. Patient is not experiencing any SOB

## 2012-10-06 NOTE — Telephone Encounter (Signed)
Dear [name] Team Please call John [name] and see how his weight and fluid is doing..If these abnormal clinical findings persist, appropriate workup will be completed. The patient understands that follow up is required to elucidate the situation. He has continued to gain weight and has SOBor 2+ lower extremity edema we need to get him ion for SDA [facility]! [name]

## 2012-10-18 ENCOUNTER — Other Ambulatory Visit: Payer: Self-pay | Admitting: Family Medicine

## 2012-10-20 ENCOUNTER — Telehealth: Payer: Self-pay | Admitting: Family Medicine

## 2012-10-20 NOTE — Telephone Encounter (Signed)
Patient was seen today and stated he fell on Monday.  Just calling to inform patient of this.  Tripped over his oxygen tubing and struck left rib and knee.  Just a little sore.  Ribs tender.  No LOC or SOB.

## 2012-10-21 NOTE — Telephone Encounter (Signed)
Dear [name] Team I meant to ask you all to call today---I guess the original mssg was from [place]? Or Home Health? Can u just call him and ask him or Drenda Freeze to make sure he is OK. I am at [facility] all morning Friday so if he needs to be seen I can work something out---sounds like it was just an [place]. Just wanna make sure THANKS! [name]

## 2012-10-22 NOTE — Telephone Encounter (Signed)
Spoke [place] from [facility] states patients is doing well. Nyla Creason, [name]

## 2012-10-29 ENCOUNTER — Telehealth: Payer: Self-pay | Admitting: Family Medicine

## 2012-10-29 NOTE — Telephone Encounter (Signed)
Dear [name] Team It is at front desk plz tell him [facility]! [name]

## 2012-10-29 NOTE — Telephone Encounter (Signed)
Pt notified.  Karrine Kluttz L, CMA  

## 2012-10-29 NOTE — Telephone Encounter (Signed)
Need refill on his Percocet.  Have appt on 6/4 with provider.  Will run out of medication by next Wed.

## 2012-10-29 NOTE — Telephone Encounter (Signed)
Will FWD to MD.  Prem Coykendall L, CMA  

## 2012-11-02 ENCOUNTER — Other Ambulatory Visit (HOSPITAL_COMMUNITY): Payer: Self-pay | Admitting: Family Medicine

## 2012-11-02 ENCOUNTER — Other Ambulatory Visit: Payer: Self-pay | Admitting: *Deleted

## 2012-11-02 ENCOUNTER — Encounter: Payer: Self-pay | Admitting: Vascular Surgery

## 2012-11-02 NOTE — Telephone Encounter (Signed)
Requested Prescriptions   Pending Prescriptions Disp Refills  . insulin glargine (LANTUS) 100 UNIT/ML injection 10 mL 0    Sig: Inject 0.06 mLs (6 Units total) into the skin daily.

## 2012-11-03 ENCOUNTER — Encounter: Payer: Self-pay | Admitting: Vascular Surgery

## 2012-11-03 ENCOUNTER — Ambulatory Visit (INDEPENDENT_AMBULATORY_CARE_PROVIDER_SITE_OTHER): Payer: Medicare Other | Admitting: Vascular Surgery

## 2012-11-03 ENCOUNTER — Other Ambulatory Visit: Payer: Self-pay | Admitting: *Deleted

## 2012-11-03 VITALS — BP 154/74 | HR 86 | Temp 98.4°F | Ht 69.0 in | Wt 182.4 lb

## 2012-11-03 DIAGNOSIS — I739 Peripheral vascular disease, unspecified: Secondary | ICD-10-CM

## 2012-11-03 DIAGNOSIS — Z48812 Encounter for surgical aftercare following surgery on the circulatory system: Secondary | ICD-10-CM

## 2012-11-03 DIAGNOSIS — Z4889 Encounter for other specified surgical aftercare: Secondary | ICD-10-CM

## 2012-11-03 DIAGNOSIS — L98499 Non-pressure chronic ulcer of skin of other sites with unspecified severity: Secondary | ICD-10-CM

## 2012-11-03 NOTE — Progress Notes (Signed)
Vascular and Vein Specialist of [place]  Patient name: DOMONIK LEVARIO MRN: [id number] DOB: Nov 21, 1955 Sex: male  REASON FOR VISIT: follow up of wound on right foot and also steal symptoms left upper extremity.  HPI: YONIEL ARKWRIGHT is a 57 y.o. male who underwent a right axillofemoral bypass and a right femoropopliteal bypass on 06/18/2012 for gangrenous wounds on his right foot. At the same time he had right fourth and fifth toe amputations. Most recently, this wound has been treated with a VAC. He comes in for a routine follow up visit. In addition, during that hospitalization he had a left brachiocephalic AV fistula placed and has had some steal symptoms.  He states that the symptoms in his left hand have improved somewhat. He continues to exercise his hand. He has no significant pain in the right foot. He denies fever or chills.  REVIEW OF SYSTEMS: Arly.Keller ] denotes positive finding; [  ] denotes negative finding  CARDIOVASCULAR:  [ ]  chest pain   [ ]  dyspnea on exertion    CONSTITUTIONAL:  [ ]  fever   [ ]  chills  PHYSICAL EXAM: Filed Vitals:   11/03/12 0927  BP: 154/74  Pulse: 86  Temp: 98.4 F (36.9 C)  TempSrc: Oral  Height: 5\' 9"  (1.753 m)  Weight: 182 lb 6.4 oz (82.736 kg)  SpO2: 100%   Body mass index is 26.92 kg/(m^2). GENERAL: The patient is a well-nourished male, in no acute distress. The vital signs are documented above. CARDIOVASCULAR: There is a regular rate and rhythm  PULMONARY: There is good air exchange bilaterally without wheezing or rales. I cannot palpate radial pulses. The left upper arm fistula has an excellent thrill. The hand appeared adequately perfused and he has good grip in the left The wound on the right foot has almost healed with only about 2 mm of separation at the skin edges. The foot appears adequately perfused.  MEDICAL ISSUES: With respect to his right foot wound, at this point I think we can discontinue the VAC. We have instructed him on daily  dressing changes with hydrogel, moist 2 x 2 with normal saline, and Kerlix.  His steal symptoms in the left upper extremity appears stable and her back improved slightly. We will continue to follow this as he has previously collected against ligation of this fistula.  I will see him back in 2 months to follow on the wound in his right foot and his steal symptoms. I have ordered ABIs in the duplex of his grafts in 6 months. He will be due for a  [employer] follow up visit at that time.  Trevis Eden S V[facility] and Vein Specialists of [place] Beeper: [phone]

## 2012-11-04 MED ORDER — INSULIN GLARGINE 100 UNIT/ML ~~LOC~~ SOLN: 6.0000 [IU] | Freq: Every day | SUBCUTANEOUS | Status: DC

## 2012-11-10 ENCOUNTER — Other Ambulatory Visit (HOSPITAL_COMMUNITY): Payer: Self-pay | Admitting: Family Medicine

## 2012-11-10 ENCOUNTER — Telehealth: Payer: Self-pay | Admitting: Family Medicine

## 2012-11-10 ENCOUNTER — Ambulatory Visit (INDEPENDENT_AMBULATORY_CARE_PROVIDER_SITE_OTHER): Payer: Medicare Other | Admitting: Family Medicine

## 2012-11-10 ENCOUNTER — Encounter: Payer: Self-pay | Admitting: Family Medicine

## 2012-11-10 VITALS — BP 134/82

## 2012-11-10 DIAGNOSIS — N184 Chronic kidney disease, stage 4 (severe): Secondary | ICD-10-CM

## 2012-11-10 DIAGNOSIS — I10 Essential (primary) hypertension: Secondary | ICD-10-CM

## 2012-11-10 DIAGNOSIS — M79609 Pain in unspecified limb: Secondary | ICD-10-CM

## 2012-11-10 DIAGNOSIS — M79671 Pain in right foot: Secondary | ICD-10-CM

## 2012-11-10 LAB — BASIC METABOLIC PANEL
Calcium: 8.2 mg/dL — ABNORMAL LOW (ref 8.4–10.5)
Potassium: 6.8 mEq/L (ref 3.5–5.3)
Sodium: 143 mEq/L (ref 135–145)

## 2012-11-10 NOTE — Assessment & Plan Note (Signed)
Has had left fistula placed. Will check cr today

## 2012-11-10 NOTE — Assessment & Plan Note (Signed)
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

## 2012-11-10 NOTE — Telephone Encounter (Signed)
Pt needs note to state that at anytime, he needs either his walker, his wheelchair AND/OR his cane at all times with him during his visits.  Will fwd to MD.  Radene Ou, CMA

## 2012-11-10 NOTE — Progress Notes (Signed)
  Subjective:    Patient ID: John [name], male    DOB: Apr 27, 1956, 57 y.o.   MRN: [id number]  HPI  1. F/u left hand pain---no better. Dr Aviva Signs thinks it may improve (steal synddrome) with exercise. He feels like hand is cold--been using hand warmers.  2. F./u htn and kidney disease---taking meds regularly. Trying to follow good diet. No lower extremity swelling recently. No SOB  3. Still having chronic pain---back to baseline---was a littleincreased during time of amputation and right after fistulla. Burning pain in bothe lower extremities, left hand feels like it is very cold. Back pain chronically 4-6/10 with sitting and gets to 7-8 / 10 with much walking. Having to use walker all the time now--has diffculty early in teh morning even with walker as he is stiffer.  4. Had a fall and thinks he cracked some ribs left. No SOB but some pain with certain movements and occasionally with deep inspiration Review of Systems Pertinent review of systems: negative for fever or unusual weight change.No chest pains.     Objective:   Physical Exam  Vital signs reviewed. GENERAL: Well-developed, well-nourished, no acute distress. GAIT: uses walker to help stand and ambulate. CARDIOVASCULAR: Regular rate and rhythm no murmur gallop or rub LUNGS: Clear to auscultation bilaterally, no rales or wheeze CHEST left lower posterior rib area is without dfect but TTP focally.. ABDOMEN: Soft positive bowel sounds NEURO: decreased sensation B feet and up to mid shin, left hand to wrist.  NEURO: AxOx4.         Assessment & Plan:  I think he did crack a rib---discussed doing several deep breaths with lung expansion several times a day.  Chronic pain--doing well on current regimen and refilled 3 m today  rtc 3 m

## 2012-11-10 NOTE — Telephone Encounter (Signed)
Pt forgot to ask at his visit for a drs note that he needs a walker, wheelchair and cane. He needs this note to present to the [employer] in [place]. He frequently visits his nephew Levis Nazir. Prison states he must have a drs note to continue to visit him Please advise

## 2012-11-10 NOTE — Telephone Encounter (Signed)
Emergency Line: [facility] lab called with critical lab value K 6.8.  I called patient and he was very pleasant.  He denied any heart palpitations/fluttering, CP, or SOB.  I advised patient to go to ER for evaluation and to rule out irregular heart rhythm.  He says he would rather not go to ED tonight because he did not want to wait and "lose all my money."    After reviewing medications, patient said he has Kayexalate at home, but does not take it like he should.  I told him to take Kayexalate 30 mg right now.  He agreed to come to clinic tomorrow as a work in to recheck potassium.  I reviewed red flags with him.  He agreed to go to ER if he developed any cardiac symptoms.

## 2012-11-10 NOTE — Assessment & Plan Note (Signed)
Good control--no med changes Check BMP today

## 2012-11-10 NOTE — Assessment & Plan Note (Signed)
Discussed precautions re use of hand warmers and topical burns Agree and encourage exercises to build collaterals

## 2012-11-11 ENCOUNTER — Telehealth: Payer: Self-pay | Admitting: Family Medicine

## 2012-11-11 ENCOUNTER — Encounter: Payer: Self-pay | Admitting: Family Medicine

## 2012-11-11 LAB — BASIC METABOLIC PANEL
Chloride: 112 mEq/L (ref 96–112)
Potassium: 5.6 mEq/L — ABNORMAL HIGH (ref 3.5–5.3)
Sodium: 145 mEq/L (ref 135–145)

## 2012-11-11 MED ORDER — FUROSEMIDE 80 MG PO TABS: ORAL_TABLET | ORAL | Status: DC

## 2012-11-11 NOTE — Addendum Note (Signed)
Addended by: Herminio Heads on: 11/11/2012 02:48 PM   Modules accepted: Orders

## 2012-11-11 NOTE — Telephone Encounter (Signed)
Dear John [name] Team Please tell him I called in his lasix (furosemide).  From the kidney doctor's notes it looks like they were tapering him DOWN to 40 mg a day---which would be ONE HALF tab of the 80 mg tabs I called in.   I know that at times they have had him on higher doses so I called it in with  Sort of vague instructions ("one half tab or up to two tabs a day by mouth"as per kidney doctor).   Make sure if he does not know what he is supposed to be taking, he either lets [place] know or calls renal (which is what I would have to do if he is not sure. From the NOTES (but that was last month) he was taking 1/2 tab a day.  THANKS! [name]

## 2012-11-11 NOTE — Telephone Encounter (Signed)
Spoke w pt He was only doing kayexelate every 5-6 days (?) so he is going to do it twice a week on a regular schedule now and we will check it in a 7-10 days.John [name]

## 2012-11-17 ENCOUNTER — Encounter: Payer: Self-pay | Admitting: Family Medicine

## 2012-11-17 NOTE — Telephone Encounter (Signed)
Done and mailed to [facility]

## 2012-11-18 ENCOUNTER — Other Ambulatory Visit: Payer: Medicare Other

## 2012-11-18 DIAGNOSIS — N184 Chronic kidney disease, stage 4 (severe): Secondary | ICD-10-CM

## 2012-11-18 NOTE — Progress Notes (Signed)
BMP DONE TODAY John [name] 

## 2012-11-19 LAB — BASIC METABOLIC PANEL
CO2: 19 mEq/L (ref 19–32)
Glucose, Bld: 125 mg/dL — ABNORMAL HIGH (ref 70–99)
Potassium: 5.7 mEq/L — ABNORMAL HIGH (ref 3.5–5.3)
Sodium: 141 mEq/L (ref 135–145)

## 2012-11-23 ENCOUNTER — Other Ambulatory Visit (HOSPITAL_COMMUNITY): Payer: Self-pay | Admitting: Family Medicine

## 2012-11-23 ENCOUNTER — Telehealth: Payer: Self-pay | Admitting: Family Medicine

## 2012-11-23 NOTE — Telephone Encounter (Signed)
Patient's grandson is incarcerated at this time. Patient uses a walker/cane to get around. In order for patient to take his walker/cane when he goes to see his grandson, patient needs a written note from Dr. [name] stating that patient requires walker for mobility. Also needs Percocet script refilled .

## 2012-11-23 NOTE — Telephone Encounter (Signed)
Notified patient that Dr. [name] had written letter and mailed to patient.  Will fwd to Dr. [name] pt's request of Percocet refill.  Sinia Antosh, Darlyne Russian, CMA

## 2012-11-24 ENCOUNTER — Encounter: Payer: Self-pay | Admitting: Family Medicine

## 2012-11-26 MED ORDER — OXYCODONE-ACETAMINOPHEN 7.5-325 MG PO TABS: ORAL_TABLET | ORAL | Status: DC

## 2012-11-26 NOTE — Telephone Encounter (Signed)
Dear [name] Team [facility] this is up front for pickl  Up [facility]! [name]

## 2012-11-27 ENCOUNTER — Other Ambulatory Visit: Payer: Self-pay | Admitting: Family Medicine

## 2012-12-01 ENCOUNTER — Other Ambulatory Visit: Payer: Self-pay | Admitting: Family Medicine

## 2012-12-01 MED ORDER — OXYCODONE-ACETAMINOPHEN 7.5-325 MG PO TABS: ORAL_TABLET | ORAL | Status: DC

## 2012-12-09 ENCOUNTER — Other Ambulatory Visit: Payer: Self-pay | Admitting: Family Medicine

## 2012-12-12 ENCOUNTER — Other Ambulatory Visit: Payer: Self-pay | Admitting: Family Medicine

## 2012-12-21 ENCOUNTER — Ambulatory Visit (INDEPENDENT_AMBULATORY_CARE_PROVIDER_SITE_OTHER): Payer: Medicare Other | Admitting: Family Medicine

## 2012-12-21 VITALS — BP 127/71 | HR 90 | Temp 98.3°F | Ht 69.0 in | Wt 183.0 lb

## 2012-12-21 DIAGNOSIS — L309 Dermatitis, unspecified: Secondary | ICD-10-CM | POA: Insufficient documentation

## 2012-12-21 DIAGNOSIS — M25561 Pain in right knee: Secondary | ICD-10-CM

## 2012-12-21 DIAGNOSIS — M25562 Pain in left knee: Secondary | ICD-10-CM

## 2012-12-21 DIAGNOSIS — M25569 Pain in unspecified knee: Secondary | ICD-10-CM

## 2012-12-21 DIAGNOSIS — L259 Unspecified contact dermatitis, unspecified cause: Secondary | ICD-10-CM

## 2012-12-21 MED ORDER — BETAMETHASONE VALERATE 0.1 % EX OINT: TOPICAL_OINTMENT | Freq: Two times a day (BID) | CUTANEOUS | Status: DC

## 2012-12-21 NOTE — Patient Instructions (Signed)

## 2012-12-21 NOTE — Assessment & Plan Note (Signed)
Likely contact dermatitis. Patient advised to avoid irritant. Betamethasone prescribed to apply to affected skin BID for 2wks. F/U in 2 wks for reassessment or sooner if symptoms worsens.

## 2012-12-21 NOTE — Progress Notes (Signed)
Subjective:     Patient ID: John [name], male   DOB: Jan 18, 1956, 57 y.o.   MRN: [id number]  HPI Rash: Patient present today with dark itchy rash over his right LL for the 3 wks,rash started as a small spot,getting worse and becoming bigger,he denies any change in oral medication,he just had a foot surgery with toe removal was given some cream to put on his foot which he has been applying to this rash with no improvement. He denies fever,no sick contact. Knee pain: He fell yesterday on his knees after he slipped on water on his kitchen floor,he denied head trauma,knee pain is about 5/10 in severity,worse with weight baring and ambulation,relieved by rest and pain medicine which he uses for his back.  Current Outpatient Prescriptions on File Prior to Visit  Medication Sig Dispense Refill  . aspirin 81 MG chewable tablet Chew 81 mg by mouth daily.      . baclofen (LIORESAL) 10 MG tablet TAKE 1 TABLET BY MOUTH TWICE DAILY  60 tablet  5  . calcitRIOL (ROCALTROL) 0.25 MCG capsule Take 1 capsule (0.25 mcg total) by mouth daily.  30 capsule  0  . calcium carbonate (TUMS - DOSED IN MG ELEMENTAL CALCIUM) 500 MG chewable tablet Chew 2 tablets (400 mg of elemental calcium total) by mouth 3 (three) times daily with meals.  180 tablet  0  . collagenase (SANTYL) ointment Apply topically daily.  30 g  1  . darbepoetin (ARANESP) 100 MCG/0.5ML SOLN Inject 0.5 mLs (100 mcg total) into the skin every Monday.  4.2 mL  0  . furosemide (LASIX) 80 MG tablet fTake one hal tab or up to two tabs daily by mouth as directed by kidney doctor.  180 tablet  3  . insulin lispro (HUMALOG) 100 UNIT/ML injection Inject 5 Units into the skin 2 (two) times daily before lunch and supper. Per sliding scale.      . metoprolol succinate (TOPROL-XL) 25 MG 24 hr tablet TAKE 1 TABLET BY MOUTH DAILY  90 tablet  3  . nicotine (NICODERM CQ - DOSED IN MG/24 HR) 7 mg/24hr patch Place 1 patch onto the skin daily.  30 patch  0  . ondansetron  (ZOFRAN) 4 MG tablet Take 1 tablet (4 mg total) by mouth every 8 (eight) hours as needed for nausea.  20 tablet  0  . oxyCODONE-acetaminophen (PERCOCET) 7.5-325 MG per tablet Take one by mouth four times a day as needed for pain  120 tablet  0  . pravastatin (PRAVACHOL) 40 MG tablet Take 1 tablet (40 mg total) by mouth daily.  90 tablet  4  . PROAIR HFA 108 (90 BASE) MCG/ACT inhaler INHALE 2 PUFFS BY MOUTH EVERY 6 HOURS AS NEEDED FOR WHEEZING  8.5 g  0  . simvastatin (ZOCOR) 80 MG tablet       . simvastatin (ZOCOR) 80 MG tablet TAKE 1 TABLET BY MOUTH AT BEDTIME  90 tablet  3  . sodium bicarbonate 650 MG tablet Take 1 tablet (650 mg total) by mouth 2 (two) times daily.  60 tablet  0  . sodium bicarbonate 650 MG tablet TAKE 2 TABLETS BY MOUTH TWICE DAILY  120 tablet  12  . sodium polystyrene (KAYEXALATE) 15 GM/60ML suspension Take 15 g by mouth See admin instructions. Takes every 3-4 days      . Tamsulosin HCl (FLOMAX) 0.4 MG CAPS Take 2 capsules (0.8 mg total) by mouth daily.  60 capsule  0  .  docusate sodium (COLACE) 250 MG capsule Take 1 capsule (250 mg total) by mouth 2 (two) times daily.  60 capsule  5  . furosemide (LASIX) 80 MG tablet TAKE 2 TABLETS BY MOUTH THREE TIMES DAILY  180 tablet  3  . insulin glargine (LANTUS) 100 UNIT/ML injection Inject 0.06 mLs (6 Units total) into the skin daily.  10 mL  0  . [DISCONTINUED] tiotropium (SPIRIVA HANDIHALER) 18 MCG inhalation capsule Place 1 capsule (18 mcg total) into inhaler and inhale daily.  30 capsule  2   No current facility-administered medications on file prior to visit.   Past Medical History  Diagnosis Date  . COPD (chronic obstructive pulmonary disease)   . Chronic back pain     Chronic narcotics for this for many years  . Diabetes mellitus   . Hyperlipidemia   . Hypertension   . Foot pain   . Peripheral vascular disease   . Asthma   . Stroke     No residual "Mini strokes"  . Ulcer 1985  . Fracture of foot     Compond- Right    . Neuromuscular disorder     periferal neuropathy  . Chronic kidney disease     CKD stage III      Review of Systems  Respiratory: Negative.   Cardiovascular: Negative.   Musculoskeletal: Positive for arthralgias.  Skin: Positive for rash.  All other systems reviewed and are negative.    Filed Vitals:   12/21/12 1027  BP: 127/71  Pulse: 90  Temp: 98.3 F (36.8 C)  TempSrc: Oral  Height: 5\' 9"  (1.753 m)  Weight: 183 lb (83.008 kg)       Objective:   Physical Exam  Nursing note and vitals reviewed. Constitutional: He appears well-developed. No distress.  Cardiovascular: Normal rate, regular rhythm, normal heart sounds and intact distal pulses.   No murmur heard. Pulmonary/Chest: Effort normal and breath sounds normal. No respiratory distress. He has no wheezes.  Abdominal: Soft. Bowel sounds are normal. He exhibits no distension. There is no tenderness.  Musculoskeletal: Normal range of motion. He exhibits no edema and no tenderness.       Right knee: Normal.       Left knee: Normal.  Skin:          Assessment/Plan:

## 2012-12-21 NOTE — Assessment & Plan Note (Signed)
S/P fall. No erythema or swelling. He has good ROM of his knee with no tenderness on palpation hence fracture unlikely. Advised to continue pain medication prn. RTC if no improvement.

## 2012-12-29 ENCOUNTER — Ambulatory Visit (INDEPENDENT_AMBULATORY_CARE_PROVIDER_SITE_OTHER): Payer: Medicare Other | Admitting: Family Medicine

## 2012-12-29 ENCOUNTER — Encounter: Payer: Self-pay | Admitting: Family Medicine

## 2012-12-29 VITALS — BP 145/60 | HR 87 | Temp 99.0°F | Ht 71.0 in | Wt 180.0 lb

## 2012-12-29 DIAGNOSIS — L309 Dermatitis, unspecified: Secondary | ICD-10-CM

## 2012-12-29 DIAGNOSIS — L259 Unspecified contact dermatitis, unspecified cause: Secondary | ICD-10-CM

## 2012-12-29 DIAGNOSIS — E1165 Type 2 diabetes mellitus with hyperglycemia: Secondary | ICD-10-CM

## 2012-12-29 DIAGNOSIS — E118 Type 2 diabetes mellitus with unspecified complications: Secondary | ICD-10-CM

## 2012-12-29 DIAGNOSIS — I739 Peripheral vascular disease, unspecified: Secondary | ICD-10-CM

## 2012-12-29 LAB — POCT GLYCOSYLATED HEMOGLOBIN (HGB A1C): Hemoglobin A1C: 6.1

## 2012-12-29 NOTE — Assessment & Plan Note (Signed)
Unclear what this is although I think it is related to his arterial and venous insufficiency as well as his diabetes. At this point I think most important thing is to try to protect it from superimposed infection so I put restore gel on the lower leg, covered the area of postop lesions with some Xeroform, and then wrap the whole thing and Profore dressing #1 and a very light lose double Ace bandage. Will try this for 48 hours, he'll come back for nurse visit and we'll proceed from there. Any new or worsening symptoms specifically any oozing or increased leg pain he'll let me know.

## 2012-12-29 NOTE — Progress Notes (Signed)
  Subjective:    Patient ID: John [name], male    DOB: 01-21-1956, 57 y.o.   MRN: [id number]  HPI Followup right leg dermatitis and rash. Had fairly acute onset of some listers that eventually ruptured. Was seen in clinic in placed on topical steroid cream. The blisters ruptured this morning in the shower. Some new punched out type lesions appear in the place of the previous blister. Is not really having pain but has very little sensation in that leg anyway.   Review of Systems No fever, no sweats, no chills.    Objective:   Physical Exam Vital signs are reviewed GENERAL: Well-developed male walks with a distinct limp. He is missing the fourth and fifth digits on the right foot. SKIN: Leathery hyperpigmented skin on the right lower extremity in patchy areas from the knee down encompassing all the dorsum of the foot. He is missing a couple of toes. Multiple circular punched-out lesions without any sign of surrounding inflammation or infection. Each is about 4-5 mm in diameter. VASCULAR: Pulses are barely palpable at dorsalis pedis.       Assessment & Plan:

## 2012-12-29 NOTE — Patient Instructions (Addendum)
Please make nurse visit for this friday

## 2012-12-30 ENCOUNTER — Telehealth: Payer: Self-pay | Admitting: *Deleted

## 2012-12-30 NOTE — Telephone Encounter (Signed)
Spoke with patient and he is going to see Dr. [name] at [facility] tomorrow first thing in the morning. Dr. [name] is ok with this.

## 2012-12-31 ENCOUNTER — Ambulatory Visit (INDEPENDENT_AMBULATORY_CARE_PROVIDER_SITE_OTHER): Payer: Medicare Other | Admitting: Family Medicine

## 2012-12-31 VITALS — BP 122/76 | Ht 71.0 in | Wt 178.0 lb

## 2012-12-31 DIAGNOSIS — I739 Peripheral vascular disease, unspecified: Secondary | ICD-10-CM

## 2012-12-31 DIAGNOSIS — L309 Dermatitis, unspecified: Secondary | ICD-10-CM

## 2012-12-31 DIAGNOSIS — L259 Unspecified contact dermatitis, unspecified cause: Secondary | ICD-10-CM

## 2012-12-31 NOTE — Progress Notes (Signed)
John [name] is here for followup of his right leg ulceration. OBJECTIVE: Improve granulation tissue in the multiple punched out lesions on the l right anterior and medial lower leg. He has one or 2 new lesions in the posterior area better again single punched out 4-5 mm diameter lesions without any surrounding erythema. There is no sign of infection. Distally his pulses are palpable dorsalis pedis. The skin on the right leg is leathery but less dry than I saw him the other day. He is hyperpigmented. The right fourth and fifth toes are amputated. ASSESSMENT: Dermatitis of the right lower extremity. I can't help but think this is related to his arterial insufficiency and is very worrisome. I have seen some significant improving and the granulation tissue so today I placed him Xeroform on the new lesions, treated the whole area with restore gel, wrapped it with the with an Ace bandage and will see him back on Monday.

## 2013-01-03 ENCOUNTER — Ambulatory Visit (INDEPENDENT_AMBULATORY_CARE_PROVIDER_SITE_OTHER): Payer: Medicare Other | Admitting: Family Medicine

## 2013-01-03 VITALS — BP 129/76 | Ht 71.0 in | Wt 178.0 lb

## 2013-01-03 DIAGNOSIS — M79671 Pain in right foot: Secondary | ICD-10-CM

## 2013-01-03 DIAGNOSIS — M79609 Pain in unspecified limb: Secondary | ICD-10-CM

## 2013-01-03 DIAGNOSIS — I739 Peripheral vascular disease, unspecified: Secondary | ICD-10-CM

## 2013-01-03 DIAGNOSIS — I70219 Atherosclerosis of native arteries of extremities with intermittent claudication, unspecified extremity: Secondary | ICD-10-CM

## 2013-01-04 ENCOUNTER — Encounter: Payer: Self-pay | Admitting: Vascular Surgery

## 2013-01-05 ENCOUNTER — Ambulatory Visit: Payer: Medicare Other | Admitting: Vascular Surgery

## 2013-01-05 ENCOUNTER — Encounter: Payer: Self-pay | Admitting: Vascular Surgery

## 2013-01-05 ENCOUNTER — Encounter (INDEPENDENT_AMBULATORY_CARE_PROVIDER_SITE_OTHER): Payer: Medicare Other

## 2013-01-05 ENCOUNTER — Ambulatory Visit (INDEPENDENT_AMBULATORY_CARE_PROVIDER_SITE_OTHER): Payer: Medicare Other | Admitting: Vascular Surgery

## 2013-01-05 ENCOUNTER — Ambulatory Visit: Payer: Medicare Other | Admitting: Family Medicine

## 2013-01-05 ENCOUNTER — Ambulatory Visit (INDEPENDENT_AMBULATORY_CARE_PROVIDER_SITE_OTHER): Payer: Medicare Other | Admitting: Family Medicine

## 2013-01-05 ENCOUNTER — Encounter: Payer: Self-pay | Admitting: Family Medicine

## 2013-01-05 VITALS — BP 146/78 | HR 102 | Temp 97.6°F | Resp 16 | Ht 71.0 in | Wt 184.0 lb

## 2013-01-05 VITALS — BP 130/57 | HR 101 | Temp 98.7°F | Ht 71.0 in | Wt 179.0 lb

## 2013-01-05 DIAGNOSIS — L97909 Non-pressure chronic ulcer of unspecified part of unspecified lower leg with unspecified severity: Secondary | ICD-10-CM

## 2013-01-05 DIAGNOSIS — L98499 Non-pressure chronic ulcer of skin of other sites with unspecified severity: Secondary | ICD-10-CM

## 2013-01-05 DIAGNOSIS — I739 Peripheral vascular disease, unspecified: Secondary | ICD-10-CM

## 2013-01-05 DIAGNOSIS — I70219 Atherosclerosis of native arteries of extremities with intermittent claudication, unspecified extremity: Secondary | ICD-10-CM

## 2013-01-05 DIAGNOSIS — Z48812 Encounter for surgical aftercare following surgery on the circulatory system: Secondary | ICD-10-CM

## 2013-01-05 DIAGNOSIS — L259 Unspecified contact dermatitis, unspecified cause: Secondary | ICD-10-CM

## 2013-01-05 DIAGNOSIS — I6529 Occlusion and stenosis of unspecified carotid artery: Secondary | ICD-10-CM

## 2013-01-05 DIAGNOSIS — L309 Dermatitis, unspecified: Secondary | ICD-10-CM

## 2013-01-05 NOTE — Progress Notes (Signed)
[facility] [place]  Patient name: John [name] MRN: [id number] DOB: Nov 01, 1955 Sex: male  REASON FOR VISIT: follow up after her right axilla femoral bypass and right femoropopliteal bypass.  HPI: John [name] is a 57 y.o. male who presented with gangrenous wounds of his right foot. He underwent a right axillofemoral bypass and right femoropopliteal bypass on 06/18/2012. He had right fourth and fifth toe amputations at that time. He comes in for routine follow up visit. He did develop some superficial wounds on the lateral and posterior aspect of his right leg but states that these wounds have improved. He is otherwise doing well except continues to have pain in his left hand where his AV fistula is. He had previously discussed ligating his fistula which he as opposed to and given that his symptoms have been stable at think this is reasonable. He is not yet on dialysis.  Past Medical History  Diagnosis Date  . COPD (chronic obstructive pulmonary disease)   . Chronic back pain     Chronic narcotics for this for many years  . Diabetes mellitus   . Hyperlipidemia   . Hypertension   . Foot pain   . Peripheral vascular disease   . Asthma   . Stroke     No residual "Mini strokes"  . Ulcer 1985  . Fracture of foot     Compond- Right  . Neuromuscular disorder     periferal neuropathy  . Chronic kidney disease     CKD stage III   SOCIAL HISTORY: History  Substance Use Topics  . Smoking status: Former Smoker -- 0.50 packs/day for 40 years    Types: Cigarettes    Quit date: 05/28/2012  . Smokeless tobacco: Never Used  . Alcohol Use: No     Comment: Previous drinker - However, quit in 2006   REVIEW OF SYSTEMS: Arly.Keller ] denotes positive finding; [  ] denotes negative finding  CARDIOVASCULAR:  [ ]  chest pain   [ ]  chest pressure   [ ]  palpitations   [ ]  orthopnea   [ ]  dyspnea on exertion    PULMONARY:   [ ]  productive cough   [ ]  asthma   [ ]  wheezing CONSTITUTIONAL:   [ ]  fever   [ ]  chills  PHYSICAL EXAM: Filed Vitals:   01/05/13 1343  BP: 146/78  Pulse: 102  Temp: 97.6 F (36.4 C)  TempSrc: Oral  Resp: 16  Height: 5\' 11"  (1.803 m)  Weight: 184 lb (83.462 kg)   Body mass index is 25.67 kg/(m^2). GENERAL: The patient is a well-nourished male, in no acute distress. The vital signs are documented above. CARDIOVASCULAR: There is a regular rate and rhythm. He has bilateral carotid bruits. He has palpable femoral pulses. I cannot palpate pedal pulses. PULMONARY: There is good air exchange bilaterally without wheezing or rales. ABDOMEN: Soft and non-tender with normal pitched bowel sounds.  MUSCULOSKELETAL: his right fourth and fifth toe amputation sites have healed.NEUROLOGIC: No focal weakness or paresthesias are detected. SKIN: he has a small superficial wound on the lateral aspect of his right leg and posterior aspect of his right leg without significant drainage.PSYCHIATRIC: The patient has a normal affect.  DATA:  Duplex of his graft showed that his right axillofemoral bypass graft and right femoropopliteal bypass graft are patent. He has known tibial artery occlusive disease. Has monophasic Doppler signals in the left foot with an ABI of 91%. This is likely falsely elevated because  of his calcific disease.  MEDICAL ISSUES: This patient has a functioning right axillofemoral bypass and right femoropopliteal bypass graft. His toe amputations on the right foot have healed. He developed some superficial wounds of the right calf which are healing. His last carotid duplex scan was in March of 2014 showed no significant disease bilaterally. He's had previous left carotid endarterectomy. He will be due for a follow up carotid duplex scan in March of 2015 and I'll see him back at that time. We will also obtain ABIs at that time. He knows to call sooner if he has problems.  Allea Kassner S [facility] [place] Beeper:  [phone]

## 2013-01-05 NOTE — Progress Notes (Signed)
  Subjective:    Patient ID: John [name], male    DOB: [birth year], 57 y.o.   MRN: [id number]  HPI Followup right lower extremity ulceration. He's had development of a new area of tenderness. He's had no fever, sweats, chills.   Review of Systems See history of present illness    Objective:   Physical Exam Vital signs are reviewed GENERAL: Well-developed male no acute distress LEG: Right lower. Hyperpigmented skin mid shin down. He is missing the fourth and fifth toes. Multiple punched out lesions on the anterior part of the shin are showing good granulation tissue. There is a new lesion on the posterior leg is about quarter size. All the other lesions are 6-7 mm in diameter, this one is about 20 mm in diameter. Dorsalis pedis pulses are barely palpable.  Procedure: Remove the old dressing. I applied restore solution to the entire leg and then placed Xeroform gauze over the areas of ulceration. This was then covered with Profore #1, and covered with some padding and very loose fitting Ace wrap.       Assessment & Plan:  Lower extremity lesions/dermatitis in a patient with known severe peripheral arterial disease. We make good progress on most of the lesions be does have a brand-new lesion. I will rewrap him and see him back in 48 hours

## 2013-01-06 ENCOUNTER — Encounter: Payer: Self-pay | Admitting: Family Medicine

## 2013-01-06 NOTE — Progress Notes (Signed)
  Subjective:    Patient ID: John [name], male    DOB: [birth year], 57 y.o.   MRN: [id number]  HPI Followup right lower extremity and leg lesions with increased pain across the top of his foot.   Review of Systems He's had no fever.    Objective:   Physical Exam Vital signs are reviewed GENERAL: Well-developed male walking with a cane. FOOT: Right. Missing fourth and fifth toes. SKIN: Right lower extremity hyperpigmented. The punch out lesions on the anterior shin are showing good granulation tissue no sign of infection. The posterior calf lesion that is quarter size is beginning to show some granulation tissue. There is mild weeping of the skin across the instep part of the foot. PROCEDURE: I applied Xeroform to all of the open areas and  the weeping area and then apply Profore dressing with very light compression.       Assessment & Plan:

## 2013-01-06 NOTE — Assessment & Plan Note (Signed)
Right lower extremity dermatitis. This is the third, seen him for dressing change every 48 hours. The very first lesions on the anterior shin are improving with nice granulation tissue. The new lesion is quarter size on the posterior calf is also starting assessment granulation tissue. I see no new lesions today. He continues to have sensation of increased pain across the top part of his foot. We will put Xeroform dressing and Profore on his right lower extremity with very little compression. He is to see his vascular doctor today and or appreciate any input they have. I am concerned this is all part of his vascular insufficiency. I'll see him back Friday morning.

## 2013-01-07 ENCOUNTER — Ambulatory Visit (INDEPENDENT_AMBULATORY_CARE_PROVIDER_SITE_OTHER): Payer: Medicare Other | Admitting: Family Medicine

## 2013-01-07 VITALS — BP 136/75 | Ht 71.0 in | Wt 179.0 lb

## 2013-01-07 DIAGNOSIS — L259 Unspecified contact dermatitis, unspecified cause: Secondary | ICD-10-CM

## 2013-01-07 DIAGNOSIS — E1165 Type 2 diabetes mellitus with hyperglycemia: Secondary | ICD-10-CM

## 2013-01-07 DIAGNOSIS — M79671 Pain in right foot: Secondary | ICD-10-CM

## 2013-01-07 DIAGNOSIS — L309 Dermatitis, unspecified: Secondary | ICD-10-CM

## 2013-01-07 DIAGNOSIS — IMO0002 Reserved for concepts with insufficient information to code with codable children: Secondary | ICD-10-CM

## 2013-01-07 DIAGNOSIS — I70219 Atherosclerosis of native arteries of extremities with intermittent claudication, unspecified extremity: Secondary | ICD-10-CM

## 2013-01-07 DIAGNOSIS — M79609 Pain in unspecified limb: Secondary | ICD-10-CM

## 2013-01-07 DIAGNOSIS — E118 Type 2 diabetes mellitus with unspecified complications: Secondary | ICD-10-CM

## 2013-01-07 NOTE — Addendum Note (Signed)
Addended by: Sharee Pimple on: 01/07/2013 08:22 AM   Modules accepted: Orders

## 2013-01-07 NOTE — Progress Notes (Signed)
  Subjective:    Patient ID: John [name], male    DOB: [birth year], 57 y.o.   MRN: [id number]  HPI Followup right lower extremity dermatitis and arterial insufficiency. He saw the asks her surgeon he does not think he's had any worsening of his arterial and venous insufficiency. The surgeon did not indicate whether or not he thought the skin lesions were related. He's had slight improvement in the posterior calf pain or the new lesion is.   Review of Systems Denies fever, sweats, chills.    Objective:   Physical Exam Vital signs reviewed GENERAL: Well-developed male no acute distress FOOT: Right. Missing fourth and fifth toes. The right lower strandy from the knee down has hyperpigmented skin. He points out lesions on the anterior shin her stay granulating quite well. A large quarter-sized lesion in the posterior leg on the calf is without any sign of infection and it to has some beginning granulation tissue. PROCEDURE: I reapplied some restore gel, 3 of the Xeroform bandages, Profore.        Assessment & Plan:  Right lower extremity dermatitis probably related to arterial insufficiency. Redressed is right lower extremity today.We'll see him back in clinic on Monday. Hopefully we can discharge him after that

## 2013-01-10 ENCOUNTER — Ambulatory Visit (INDEPENDENT_AMBULATORY_CARE_PROVIDER_SITE_OTHER): Payer: Medicare Other | Admitting: Family Medicine

## 2013-01-10 VITALS — BP 120/70 | Ht 71.0 in | Wt 178.0 lb

## 2013-01-10 DIAGNOSIS — M79671 Pain in right foot: Secondary | ICD-10-CM

## 2013-01-10 DIAGNOSIS — L98499 Non-pressure chronic ulcer of skin of other sites with unspecified severity: Secondary | ICD-10-CM

## 2013-01-10 DIAGNOSIS — L309 Dermatitis, unspecified: Secondary | ICD-10-CM

## 2013-01-10 DIAGNOSIS — M79609 Pain in unspecified limb: Secondary | ICD-10-CM

## 2013-01-10 DIAGNOSIS — S91119A Laceration without foreign body of unspecified toe without damage to nail, initial encounter: Secondary | ICD-10-CM

## 2013-01-10 DIAGNOSIS — I739 Peripheral vascular disease, unspecified: Secondary | ICD-10-CM

## 2013-01-10 DIAGNOSIS — L259 Unspecified contact dermatitis, unspecified cause: Secondary | ICD-10-CM

## 2013-01-10 DIAGNOSIS — S91109A Unspecified open wound of unspecified toe(s) without damage to nail, initial encounter: Secondary | ICD-10-CM

## 2013-01-10 MED ORDER — CEPHALEXIN 500 MG PO CAPS: 500.0000 mg | ORAL_CAPSULE | Freq: Three times a day (TID) | ORAL | Status: DC

## 2013-01-10 MED ORDER — METOPROLOL SUCCINATE ER 25 MG PO TB24: 25.0000 mg | ORAL_TABLET | Freq: Every day | ORAL | Status: DC

## 2013-01-11 ENCOUNTER — Encounter: Payer: Self-pay | Admitting: Family Medicine

## 2013-01-11 NOTE — Progress Notes (Signed)
Patient ID: John [name], male   DOB: 09/14/1955, 57 y.o.   MRN: [id number] PROCEDURE NOTE: Remove the old dressing from his right lower extremity. The punch out lesions on the anterior shin are well on her way to healing. A large quarter-sized lesion on the posterior calf has good granulation tissue. The entire lower leg skin is dry and flaky. He also has a cut on his third toe. There is no sign of infection but he is using small amount of blood. I cleaned it with some soapy water and peroxide and applied Xeroform gauze and wrapped a small bandage around it. It the whole leg I applied restore gel. I did not wrap with Kerlix this time but placed her wrap we usually use undercast over his leg. Urgent he does try. I'll see him in 48 hours. Continue the postop shoe which is chronic for him.

## 2013-01-11 NOTE — Progress Notes (Signed)
Patient ID: COLYN MIRON, male   DOB: January 04, 1956, 57 y.o.   MRN: [id number] Dear [name] Team Please schedule Oletta Darter to see [facility] tomorrow morning sometime for re changeof his dressings on his leg. He is already aware of apppt--just needs to be scheduled. THANKS! [name]

## 2013-01-12 ENCOUNTER — Encounter: Payer: Self-pay | Admitting: Family Medicine

## 2013-01-12 ENCOUNTER — Ambulatory Visit (INDEPENDENT_AMBULATORY_CARE_PROVIDER_SITE_OTHER): Payer: Medicare Other | Admitting: Family Medicine

## 2013-01-12 VITALS — BP 134/69 | HR 94 | Temp 98.9°F | Ht 71.0 in | Wt 184.0 lb

## 2013-01-12 DIAGNOSIS — I739 Peripheral vascular disease, unspecified: Secondary | ICD-10-CM

## 2013-01-12 DIAGNOSIS — L98499 Non-pressure chronic ulcer of skin of other sites with unspecified severity: Secondary | ICD-10-CM

## 2013-01-12 NOTE — Progress Notes (Signed)
Patient ID: John [name], male   DOB: 1955/07/11, 57 y.o.   MRN: [id number] Dressing change for his leg. No fever  no new pain  The very first lesions on the anterior shin are improving with nice granulation tissue. The new lesion is quarter size on the posterior calf is also starting assessment granulation tissue. I see no new lesions today. He continues to have sensation of increased pain across the top part of his foot. We will put Xeroform dressing and Profore on his right lower extremity with very little compression.

## 2013-01-14 ENCOUNTER — Ambulatory Visit: Payer: Medicare Other

## 2013-01-17 ENCOUNTER — Ambulatory Visit (INDEPENDENT_AMBULATORY_CARE_PROVIDER_SITE_OTHER): Payer: Medicare Other | Admitting: Family Medicine

## 2013-01-17 VITALS — BP 118/60 | HR 86 | Temp 98.7°F | Wt 186.0 lb

## 2013-01-17 DIAGNOSIS — L259 Unspecified contact dermatitis, unspecified cause: Secondary | ICD-10-CM

## 2013-01-17 DIAGNOSIS — E118 Type 2 diabetes mellitus with unspecified complications: Secondary | ICD-10-CM

## 2013-01-17 DIAGNOSIS — L309 Dermatitis, unspecified: Secondary | ICD-10-CM

## 2013-01-17 DIAGNOSIS — L98499 Non-pressure chronic ulcer of skin of other sites with unspecified severity: Secondary | ICD-10-CM

## 2013-01-17 DIAGNOSIS — I739 Peripheral vascular disease, unspecified: Secondary | ICD-10-CM

## 2013-01-17 DIAGNOSIS — E1165 Type 2 diabetes mellitus with hyperglycemia: Secondary | ICD-10-CM

## 2013-01-17 MED ORDER — OXYCODONE-ACETAMINOPHEN 7.5-325 MG PO TABS: ORAL_TABLET | ORAL | Status: DC

## 2013-01-17 MED ORDER — INSULIN GLARGINE 100 UNIT/ML ~~LOC~~ SOLN: 6.0000 [IU] | Freq: Every day | SUBCUTANEOUS | Status: DC

## 2013-01-17 MED ORDER — TRIAMCINOLONE ACETONIDE 0.1 % EX CREA: TOPICAL_CREAM | Freq: Two times a day (BID) | CUTANEOUS | Status: DC

## 2013-01-17 NOTE — Progress Notes (Signed)
Patient ID: John [name], male   DOB: Apr 11, 1956, 57 y.o.   MRN: [id number] F/u leg ulceration and dermatitis. Much improved. Anterior lesions resolvd and posterior calf lesion is almost resolved. Skin is lichenified and hyperpigmented on front and back from knee dwon. Also now has thickened hyperpigmented areas on dorsum of left hand, at top f gluteal crease 9small quarter sized area), at left iliac crest. Will start combo of triamcinilone light coating followed by some vaseline BID. RTC 4-6 wweeks or of new or return of sx. I have also recommended MVI (high dose) as this may help skinissues.

## 2013-01-17 NOTE — Patient Instructions (Addendum)
Add TWO regular one a day (centrum silver type ) vitamins  For your leg, put small amount of cream (triamcinolone) on all areas that itch, then cover with some vaseline and  (for leg) a tube sock). Do once or twice a day. See me in 4-6 weeks.

## 2013-01-25 ENCOUNTER — Encounter: Payer: Self-pay | Admitting: Nephrology

## 2013-02-21 ENCOUNTER — Telehealth: Payer: Self-pay | Admitting: Family Medicine

## 2013-02-21 NOTE — Telephone Encounter (Signed)
Pt is requesting a refill on his percocet. JW

## 2013-02-22 NOTE — Telephone Encounter (Signed)
Dear [name] Team Please let him know rx up front THANKS! John [name]

## 2013-02-23 NOTE — Telephone Encounter (Signed)
patient informed.voiced understanding. John [name], [name]

## 2013-03-04 ENCOUNTER — Other Ambulatory Visit (HOSPITAL_COMMUNITY): Payer: Self-pay | Admitting: *Deleted

## 2013-03-04 ENCOUNTER — Other Ambulatory Visit (HOSPITAL_COMMUNITY): Payer: Self-pay

## 2013-03-04 MED ORDER — SODIUM CHLORIDE 0.9 % IV SOLN: INTRAVENOUS | Status: DC

## 2013-03-04 MED ORDER — SODIUM CHLORIDE 0.9 % IV SOLN
1020.0000 mg | Freq: Once | INTRAVENOUS | Status: AC
  Filled 2013-03-04: qty 34

## 2013-03-04 MED ORDER — FERUMOXYTOL INJECTION 510 MG/17 ML: 510.0000 mg | INTRAVENOUS | Status: DC

## 2013-03-08 ENCOUNTER — Inpatient Hospital Stay (HOSPITAL_COMMUNITY): Admission: RE | Admit: 2013-03-08 | Payer: Medicare Other | Source: Ambulatory Visit

## 2013-03-16 ENCOUNTER — Encounter (HOSPITAL_COMMUNITY)
Admission: RE | Admit: 2013-03-16 | Discharge: 2013-03-16 | Disposition: A | Discharge status: Home / Self Care | Payer: Medicare Other | Source: Ambulatory Visit | Attending: Nephrology | Admitting: Nephrology

## 2013-03-16 ENCOUNTER — Encounter: Payer: Self-pay | Admitting: Family Medicine

## 2013-03-16 ENCOUNTER — Ambulatory Visit (INDEPENDENT_AMBULATORY_CARE_PROVIDER_SITE_OTHER): Payer: Medicare Other | Admitting: Family Medicine

## 2013-03-16 VITALS — BP 136/57 | HR 83 | Temp 98.8°F | Ht 71.0 in | Wt 185.0 lb

## 2013-03-16 DIAGNOSIS — M549 Dorsalgia, unspecified: Secondary | ICD-10-CM

## 2013-03-16 DIAGNOSIS — L259 Unspecified contact dermatitis, unspecified cause: Secondary | ICD-10-CM

## 2013-03-16 DIAGNOSIS — N184 Chronic kidney disease, stage 4 (severe): Secondary | ICD-10-CM | POA: Insufficient documentation

## 2013-03-16 DIAGNOSIS — M25519 Pain in unspecified shoulder: Secondary | ICD-10-CM

## 2013-03-16 DIAGNOSIS — L309 Dermatitis, unspecified: Secondary | ICD-10-CM

## 2013-03-16 DIAGNOSIS — M25511 Pain in right shoulder: Secondary | ICD-10-CM

## 2013-03-16 DIAGNOSIS — E1165 Type 2 diabetes mellitus with hyperglycemia: Secondary | ICD-10-CM

## 2013-03-16 DIAGNOSIS — D631 Anemia in chronic kidney disease: Secondary | ICD-10-CM | POA: Insufficient documentation

## 2013-03-16 MED ORDER — FERUMOXYTOL INJECTION 510 MG/17 ML
1020.0000 mg | Freq: Once | INTRAVENOUS | Status: AC
  Administered 2013-03-16: 1020 mg via INTRAVENOUS
  Filled 2013-03-16: qty 34

## 2013-03-16 MED ORDER — OXYCODONE-ACETAMINOPHEN 7.5-325 MG PO TABS: ORAL_TABLET | ORAL | Status: DC

## 2013-03-16 NOTE — Progress Notes (Signed)
PT states he has not been to or been around anyone who has been to [place] in the last 21 days.

## 2013-03-17 NOTE — Progress Notes (Signed)
  Subjective:    Patient ID: John [name], male    DOB: 1955/06/28, 57 y.o.   MRN: [id number]  HPI #1. Right shoulder pain over the last 3 or 4 weeks. Worse with overhead motions. Having difficulty sleeping on that side at night. No specific injury. Has had intermittent problems over the years in the past but he used to resolve after a few days. This is not resolving. No numbness in his hand or weakness noted in the arm. #2. Followup chronic pain. Continues to have bilateral lower extremity, low back and right thigh pain. The pain medicine keeps his pain at about 4-5/10 whereas without it is non-out of 10. #3. Chronic renal insufficiency. Recently contacted by renal Dr. to inform him that he needs some IV iron which she is going to get today. #4. Lower extremity skin changes probably associated with venous stasis. He had an episode a month or so ago her many of these were weeping and then opened up and were open lesions. He's not had any problems for several weeks until last 2 days when he noticed several of the lesions are appearing again. He started using the cream I gave him and it seems to have stopped progressing.   Review of Systems Denies fever, sweats, chills, unusual weight change.    Objective:   Physical Exam Vital signs are reviewed blood pressure 136 or 57, pulse 83, temp 98.8, weight 185.  CARDIOVASCULAR: Regular rate and rhythm no murmur Shoulder: Right pain with impingement testing and supraspinatus testing. Full-strength in the rotator cuff. Distally he is neurovascularly intact in bilateral upper extremities. EXTREMITY: Right lower leg reveals chronic venous stasis changes as well as 2 or 3 small 3-4 mm punched-out lesions that appear clean and showed no sign of infection. He does not really have any edema area he does have hyperpigmentation bilateral skin for about the knee down. 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  INJECTION: Patient was given informed consent, signed  copy in the chart. Appropriate time out was taken. Area prepped and draped in usual sterile fashion. One cc of methylprednisolone 40 mg/ml plus  4 cc of 1% lidocaine without epinephrine was injected into the right subacromial bursa using a(n) posterior approach. The patient tolerated the procedure well. There were no complications. Post procedure instructions were given.        Assessment & Plan:

## 2013-03-17 NOTE — Assessment & Plan Note (Signed)
Still not sure what these punched-out lesions are about think they're related to his renal insufficiency, vascular disease. Continue use of topical crane and follow this closely. He'll call me if things get worse.

## 2013-03-17 NOTE — Assessment & Plan Note (Signed)
Is doing really well on the current pain medicine regimen. I gave him a refill today on see him back in a month.

## 2013-03-22 ENCOUNTER — Encounter: Payer: Self-pay | Admitting: Family Medicine

## 2013-03-22 NOTE — Progress Notes (Signed)
Patient ID: John [name], male   DOB: [birth year], 56 y.o.   MRN: [id number] Notes from renal K=5.8 Cr = 3.6 PTH = 171 (15-65) Ferritin 622 Serum iron 23 TIBC 208 (250-450) Dr. Lacy Duverney

## 2013-03-23 ENCOUNTER — Encounter: Payer: Self-pay | Admitting: Family Medicine

## 2013-03-23 ENCOUNTER — Other Ambulatory Visit: Payer: Self-pay | Admitting: Family Medicine

## 2013-03-23 DIAGNOSIS — R159 Full incontinence of feces: Secondary | ICD-10-CM

## 2013-04-27 ENCOUNTER — Encounter: Payer: Self-pay | Admitting: Family Medicine

## 2013-04-27 ENCOUNTER — Ambulatory Visit (INDEPENDENT_AMBULATORY_CARE_PROVIDER_SITE_OTHER): Payer: Medicare Other | Admitting: Family Medicine

## 2013-04-27 VITALS — BP 128/53 | HR 84 | Temp 98.8°F | Ht 71.0 in | Wt 187.5 lb

## 2013-04-27 DIAGNOSIS — E1149 Type 2 diabetes mellitus with other diabetic neurological complication: Secondary | ICD-10-CM

## 2013-04-27 MED ORDER — TAMSULOSIN HCL 0.4 MG PO CAPS: 0.8000 mg | ORAL_CAPSULE | Freq: Every day | ORAL | Status: DC

## 2013-04-27 MED ORDER — OXYCODONE-ACETAMINOPHEN 7.5-325 MG PO TABS: ORAL_TABLET | ORAL | Status: DC

## 2013-04-29 NOTE — Progress Notes (Signed)
  Subjective:    Patient ID: John [name], male    DOB: [birth year], 57 y.o.   MRN: [id number]  HPI Followup bilateral lower extremity polyneuropathy. Doing pretty well on his current pain medicine regimen. Needs refills #2. EKG followed by the renal physician. She's not having any lower extremity edema. He continues to make urine but small amounts daily. Has questions about whether or not he needs to stay on the Flomax.   Review of Systems Denies fever, sweats, chills, unusual weight change.    Objective:   Physical Exam  Vital signs reviewed. GENERAL: Well-developed, well-nourished, no acute distress. CARDIOVASCULAR: Regular rate and rhythm no murmur gallop or rub LUNGS: Clear to auscultation bilaterally, no rales or wheeze. ABDOMEN: Soft positive bowel sounds NEURO: Decreased sensation soft touch bilateral lower extremity from about the knee down. MSK: Movement of extremity x 4. He is missing the lateral portion of his right foot and toes.        Assessment & Plan:  #1. Peripheral neuropathy. Continue current pain medicine regimen. Sexually pre-stable right now

## 2013-05-04 ENCOUNTER — Ambulatory Visit: Payer: Medicare Other | Admitting: Vascular Surgery

## 2013-06-06 ENCOUNTER — Encounter: Payer: Self-pay | Admitting: Family Medicine

## 2013-06-06 ENCOUNTER — Ambulatory Visit (INDEPENDENT_AMBULATORY_CARE_PROVIDER_SITE_OTHER): Payer: Medicare Other | Admitting: Family Medicine

## 2013-06-06 VITALS — BP 166/80 | HR 91 | Temp 97.8°F | Ht 71.0 in | Wt 186.0 lb

## 2013-06-06 DIAGNOSIS — R059 Cough, unspecified: Secondary | ICD-10-CM | POA: Insufficient documentation

## 2013-06-06 DIAGNOSIS — R05 Cough: Secondary | ICD-10-CM | POA: Insufficient documentation

## 2013-06-06 MED ORDER — OSELTAMIVIR PHOSPHATE 30 MG PO CAPS: 30.0000 mg | ORAL_CAPSULE | Freq: Every day | ORAL | Status: DC

## 2013-06-06 MED ORDER — ALBUTEROL SULFATE HFA 108 (90 BASE) MCG/ACT IN AERS: 2.0000 | INHALATION_SPRAY | RESPIRATORY_TRACT | Status: DC | PRN

## 2013-06-06 MED ORDER — BENZONATATE 200 MG PO CAPS: 200.0000 mg | ORAL_CAPSULE | Freq: Three times a day (TID) | ORAL | Status: DC | PRN

## 2013-06-06 MED ORDER — BREATHERITE COLL SPACER ADULT MISC: 1.0000 | Freq: Once | Status: DC

## 2013-06-06 NOTE — Patient Instructions (Addendum)
It was great seeing you today.   1. Your cough is most likely due to your history of smoking. You should use your Inhaler at night prior to going to bed.  2. Since your wife was recently treated for flu, I will prescribe Tamiflu for you. 3. Eye prosthetic eye redness and drainage is most likely caused by a virus: You should remove and wash it every night while you are sick.  1. Also place a warmth wash cloth on your left eye two times a day for 15 mins   Please bring all your medications to every doctors visit  Sign up for My Chart to have easy access to your labs results, and communication with your Primary care physician.  Next Appointment  Make an appointment with Dr [name] at her next available time: For COPD discussion and PFTs   I look forward to talking with you again at our next visit. If you have any questions or concerns before then, please call the clinic at [phone].  Take Care,   Dr Wenda Low

## 2013-06-06 NOTE — Progress Notes (Signed)
Subjective:     Patient ID: John [name], male   DOB: 10-24-1955, 57 y.o.   MRN: [id number]  HPI Comments: He reports a dry, nonproductive cough for the last 2 weeks that has not been improving or worsening.  Denies any other symptoms: No fevers, URI symptoms, heartburn, muscle aches, allergies, or new medications.  He reports smoking 1.5ppd ~ 68yrs, but has not smoked in the past several months.  He has a prescription for Proair, which he states he has not used in several months.  He denies been diagnosed with COPD, but says he was told he had asthma when he was a kid.  His wife was recently treated in the hospital for flu.  He did not visit with her in the hospital, but has been exposed at home.  He denies receiving a flu vaccine this year, and is adamantly opposed receiving it today.  He denies chest pain or shortness of breath.  He reports that he is most concerned today about the fact that the cough keeps him up at night.     Review of Systems  Constitutional: Negative for fever, chills, diaphoresis, activity change and fatigue.  HENT: Negative for congestion, postnasal drip, rhinorrhea and sore throat.   Respiratory: Positive for cough. Negative for chest tightness, shortness of breath, wheezing and stridor.   Cardiovascular: Negative for chest pain and leg swelling.  Gastrointestinal: Negative for nausea and diarrhea.  Musculoskeletal: Negative for myalgias.       Objective:   Physical Exam  Constitutional: He appears well-developed.  HENT:  Mouth/Throat: Oropharynx is clear and moist. No oropharyngeal exudate.  Cardiovascular: Normal rate and regular rhythm.   No murmur heard. Pulmonary/Chest: Effort normal. No stridor. No respiratory distress. He has no wheezes. He has no rales.  Coarse bronchial breath sounds that clear with cough.   Abdominal: Soft. He exhibits no distension. There is no tenderness.  Lymphadenopathy:    He has no cervical adenopathy.  Skin: No rash noted.    Assessment/Plan:      See Problem Focused Assessment & Plan

## 2013-06-06 NOTE — Assessment & Plan Note (Signed)
Pertinent S&O  New dry cough x 2wks; ~75yr smoking Hx; Has Proair inhaler but doesn't use  Wife recently + for Flu; He has no symptoms besides cough  Denies allergies, postnasal drip, GERD symptoms, or URI symptoms Assessment  DDx: COPD (w/o exacerbation) vs bronchitis vs viral syndrome vs silent GERD Plan  Refill Rx for Proair w/ spacer: instructed to use at night  Tessalon pearls  Tamiflu: renally adjusted dose (CrClerance ~ 20)  Recommended follow-up with PCP for PFTs for assessment of COPD and need for controller meds

## 2013-06-07 ENCOUNTER — Telehealth: Payer: Self-pay | Admitting: Family Medicine

## 2013-06-07 NOTE — Telephone Encounter (Signed)
Please advise. John [name] S  

## 2013-06-07 NOTE — Telephone Encounter (Signed)
Pt called because the medication that was given on 12/29 for his cold is not covered by his insurance and would like something else called. jw

## 2013-06-08 NOTE — Telephone Encounter (Signed)
Can you call and ask what medication isn't being covered? There were multiple prescribed that day. Thank you. Greig Castilla

## 2013-06-08 NOTE — Telephone Encounter (Signed)
Wife calls again. Need something else called in. Was seen and prescribed meds by Dr. Gayla Doss. Will fwd to him.

## 2013-06-13 NOTE — Telephone Encounter (Signed)
I'm not sure if I routed the last message properly. Can you call this patient and ask what medication was not covered by insurance and if they would still like it? Thank you. Mitzi Hansen

## 2013-06-14 NOTE — Telephone Encounter (Signed)
Dear Dema Severin Team PLEASE take care of this--I guess he saw Dr Berkley Harvey and was given a rx for cold symptoms---call [facility] and figure out WHAT was not covered and does he still need a different medicine THANKS! [name]

## 2013-06-21 ENCOUNTER — Telehealth: Payer: Self-pay | Admitting: Family Medicine

## 2013-06-21 NOTE — Telephone Encounter (Signed)
Patient had an appt for 1/14 but due to weather, was cancelled. Patient will need refill for pain meds. Dr. Nori Riis has no appt for the next two weeks.

## 2013-06-22 ENCOUNTER — Other Ambulatory Visit: Payer: Self-pay | Admitting: Family Medicine

## 2013-06-22 ENCOUNTER — Ambulatory Visit: Payer: Medicare Other | Admitting: Family Medicine

## 2013-06-22 DIAGNOSIS — N184 Chronic kidney disease, stage 4 (severe): Secondary | ICD-10-CM

## 2013-06-22 MED ORDER — OXYCODONE-ACETAMINOPHEN 7.5-325 MG PO TABS: ORAL_TABLET | ORAL | Status: DC

## 2013-06-22 NOTE — Telephone Encounter (Signed)
Informed patient and he is coming in for labs 1/15

## 2013-06-22 NOTE — Telephone Encounter (Signed)
Dear Dema Severin Team Please tell him these are at front desk [facility]! [name]

## 2013-06-23 ENCOUNTER — Other Ambulatory Visit (INDEPENDENT_AMBULATORY_CARE_PROVIDER_SITE_OTHER): Payer: Medicare Other

## 2013-06-23 DIAGNOSIS — N184 Chronic kidney disease, stage 4 (severe): Secondary | ICD-10-CM

## 2013-06-23 LAB — POCT HEMOGLOBIN: Hemoglobin: 10.8 g/dL — AB (ref 14.1–18.1)

## 2013-06-23 NOTE — Progress Notes (Signed)
RENAL PANEL,PTH,FERRITIN,IBC AND HgB DONE TODAY [place]

## 2013-06-24 LAB — RENAL FUNCTION PANEL
Albumin: 3.5 g/dL (ref 3.5–5.2)
BUN: 67 mg/dL — ABNORMAL HIGH (ref 6–23)
CHLORIDE: 116 meq/L — AB (ref 96–112)
CO2: 20 meq/L (ref 19–32)
Calcium: 7.6 mg/dL — ABNORMAL LOW (ref 8.4–10.5)
Creat: 3.51 mg/dL — ABNORMAL HIGH (ref 0.50–1.35)
GLUCOSE: 73 mg/dL (ref 70–99)
POTASSIUM: 5.4 meq/L — AB (ref 3.5–5.3)
Phosphorus: 4.6 mg/dL (ref 2.3–4.6)
SODIUM: 145 meq/L (ref 135–145)

## 2013-06-24 LAB — FERRITIN: FERRITIN: 764 ng/mL — AB (ref 22–322)

## 2013-06-24 LAB — IBC PANEL
%SAT: 24 % (ref 20–55)
TIBC: 233 ug/dL (ref 215–435)
UIBC: 178 ug/dL (ref 125–400)

## 2013-06-24 LAB — PTH, INTACT AND CALCIUM
CALCIUM: 7.6 mg/dL — AB (ref 8.4–10.5)
PTH: 308.2 pg/mL — ABNORMAL HIGH (ref 14.0–72.0)

## 2013-06-24 LAB — IRON: Iron: 55 ug/dL (ref 42–165)

## 2013-06-29 ENCOUNTER — Telehealth: Payer: Self-pay | Admitting: Family Medicine

## 2013-06-29 ENCOUNTER — Encounter: Payer: Self-pay | Admitting: Family Medicine

## 2013-06-29 NOTE — Telephone Encounter (Signed)
Dear Dema Severin Team Can you fax his most recent labs to  [facility] [phone] abs to:

## 2013-06-29 NOTE — Telephone Encounter (Signed)
Faxed out

## 2013-07-07 ENCOUNTER — Other Ambulatory Visit: Payer: Self-pay | Admitting: Vascular Surgery

## 2013-07-07 DIAGNOSIS — I6529 Occlusion and stenosis of unspecified carotid artery: Secondary | ICD-10-CM

## 2013-07-13 ENCOUNTER — Ambulatory Visit (INDEPENDENT_AMBULATORY_CARE_PROVIDER_SITE_OTHER): Payer: Medicare Other | Admitting: Family Medicine

## 2013-07-13 ENCOUNTER — Encounter: Payer: Self-pay | Admitting: Family Medicine

## 2013-07-13 VITALS — BP 137/56 | HR 76 | Temp 98.1°F | Wt 182.9 lb

## 2013-07-13 DIAGNOSIS — IMO0002 Reserved for concepts with insufficient information to code with codable children: Secondary | ICD-10-CM

## 2013-07-13 DIAGNOSIS — E1165 Type 2 diabetes mellitus with hyperglycemia: Secondary | ICD-10-CM

## 2013-07-13 DIAGNOSIS — E118 Type 2 diabetes mellitus with unspecified complications: Principal | ICD-10-CM

## 2013-07-13 DIAGNOSIS — G959 Disease of spinal cord, unspecified: Secondary | ICD-10-CM

## 2013-07-13 DIAGNOSIS — N184 Chronic kidney disease, stage 4 (severe): Secondary | ICD-10-CM

## 2013-07-13 DIAGNOSIS — M4712 Other spondylosis with myelopathy, cervical region: Secondary | ICD-10-CM

## 2013-07-13 LAB — POCT GLYCOSYLATED HEMOGLOBIN (HGB A1C): HEMOGLOBIN A1C: 5.6

## 2013-07-13 NOTE — Progress Notes (Signed)
   Subjective:    Patient ID: John [name], male    DOB: 1955/08/06, 58 y.o.   MRN: [id number]  HPI #1. Followup diabetes mellitus. No problems with medicines. No episodes of low blood sugar. Died is stable and is trying to follow a low-fat diet. #2. Chronic renal insufficiency: They have increased his calcitriol. He continues to follow the nephrologist. He makes some urine. #3. Left arm pain. Notices it with certain motions particularly of his neck. He turns to the right heel has some radiation down into the left upper arm. Sometimes he'll have tingling all the way down to his fingers. Doesn't feel like that armors particularly weak. 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 GENERAL: Well-developed, well-nourished, no acute distress. CARDIOVASCULAR: Regular rate and rhythm no murmur gallop or rub LUNGS: Clear to auscultation bilaterally, no rales or wheeze. ABDOMEN: Soft positive bowel sounds NEURO: No gross focal neurological deficits. MSK: Movement of extremity x 4. Upper extremity strength C4-5 and 6 is bilaterally symmetrical and normal       Assessment & Plan:  #1 pre-diabetes mellitus stable we'll check A1c today #2. Chronic renal insufficiency. His been doing pretty well with that. He's been followed by renal #3. Left radicular neck pain. We reviewed his MRI from a couple of years ago in he's had a prior ACDF it looks like it's stabilizing things. He will likely have intermittent problems. He cannot take gabapentin secondary to his renal disease.

## 2013-07-19 ENCOUNTER — Encounter: Payer: Self-pay | Admitting: Family Medicine

## 2013-08-08 ENCOUNTER — Other Ambulatory Visit: Payer: Self-pay | Admitting: Family Medicine

## 2013-08-08 MED ORDER — OXYCODONE-ACETAMINOPHEN 7.5-325 MG PO TABS: ORAL_TABLET | ORAL | Status: DC

## 2013-08-16 ENCOUNTER — Other Ambulatory Visit: Payer: Self-pay | Admitting: *Deleted

## 2013-08-17 MED ORDER — FUROSEMIDE 80 MG PO TABS: ORAL_TABLET | ORAL | Status: DC

## 2013-08-19 ENCOUNTER — Other Ambulatory Visit: Payer: Self-pay | Admitting: *Deleted

## 2013-08-19 NOTE — Telephone Encounter (Signed)
Received message from [facility] regarding Rx for Oxycodone refill.  According to [facility] procedures pt is due for refill.  Last filled on 07/23/2013.  Can this Rx be filled now, please call [phone]. Derl Barrow, RN

## 2013-08-23 MED ORDER — INSULIN GLARGINE 100 UNIT/ML ~~LOC~~ SOLN: 6.0000 [IU] | Freq: Every day | SUBCUTANEOUS | Status: DC

## 2013-09-06 ENCOUNTER — Encounter: Payer: Self-pay | Admitting: Vascular Surgery

## 2013-09-07 ENCOUNTER — Ambulatory Visit (INDEPENDENT_AMBULATORY_CARE_PROVIDER_SITE_OTHER): Payer: Medicare Other | Admitting: Family

## 2013-09-07 ENCOUNTER — Ambulatory Visit (HOSPITAL_COMMUNITY)
Admission: RE | Admit: 2013-09-07 | Discharge: 2013-09-07 | Disposition: A | Discharge status: Home / Self Care | Payer: Medicare Other | Source: Ambulatory Visit | Attending: Vascular Surgery | Admitting: Vascular Surgery

## 2013-09-07 ENCOUNTER — Encounter: Payer: Self-pay | Admitting: Family

## 2013-09-07 ENCOUNTER — Ambulatory Visit (INDEPENDENT_AMBULATORY_CARE_PROVIDER_SITE_OTHER)
Admission: RE | Admit: 2013-09-07 | Discharge: 2013-09-07 | Disposition: A | Discharge status: Home / Self Care | Payer: Medicare Other | Source: Ambulatory Visit | Attending: Vascular Surgery | Admitting: Vascular Surgery

## 2013-09-07 VITALS — BP 148/72 | HR 78 | Resp 16 | Ht 71.0 in | Wt 179.0 lb

## 2013-09-07 DIAGNOSIS — I6529 Occlusion and stenosis of unspecified carotid artery: Secondary | ICD-10-CM

## 2013-09-07 DIAGNOSIS — Z48812 Encounter for surgical aftercare following surgery on the circulatory system: Secondary | ICD-10-CM | POA: Insufficient documentation

## 2013-09-07 DIAGNOSIS — I739 Peripheral vascular disease, unspecified: Secondary | ICD-10-CM | POA: Insufficient documentation

## 2013-09-07 DIAGNOSIS — L98499 Non-pressure chronic ulcer of skin of other sites with unspecified severity: Principal | ICD-10-CM | POA: Insufficient documentation

## 2013-09-07 DIAGNOSIS — I6523 Occlusion and stenosis of bilateral carotid arteries: Secondary | ICD-10-CM

## 2013-09-07 NOTE — Progress Notes (Signed)
[facility] [place] HISTORY AND PHYSICAL   MRN : [id number]  History of Present Illness:   John [name] is a 58 y.o. male patient of Dr. Scot Dock who is s/p right axillofemoral bypass and right femoropopliteal bypass on 06/18/2012. He had right fourth and fifth toe amputations at that time. He is also s/p left CEA on 12/18/11, and left AV fistula creation on 06/24/2012. He returns today for carotid Duplex and ABI's.   He is not on HD, states his kidney function is adequate so far. After walking about 100 yards his calves hurt, resolve with sitting, denies non healing wounds. He states he has a "swollen spine", possibly spinal stenosis; he has chronic low back pain since falling in 2005. Traumatic loss of left eye at age 4. He vomited twice yesterday, and once this morning, denies abdominal pain, denies myalgias, fever, or chills; advised patient to let his PCP know about this ASAP, states he is keeping fluids down.  Patient has Negative history of TIA or stroke symptom.  The patient denies amaurosis fugax or monocular blindness.  The patient  denies facial drooping.  Pt. denies hemiplegia.  The patient denies receptive or expressive aphasia.    Pt Diabetic: Yes, last A1C was 5.6 a month ago. Pt smoker: smokes 4-5 cigarettes/day, started in his teens  Current Outpatient Prescriptions  Medication Sig Dispense Refill  . oxyCODONE-acetaminophen (PERCOCET) 7.5-325 MG per tablet Take one by mouth four times a day as needed for pain Do not fill before thirty days from the date of this prescription  120 tablet  0  . albuterol (PROAIR HFA) 108 (90 BASE) MCG/ACT inhaler Inhale 2 puffs into the lungs every 4 (four) hours as needed for wheezing or shortness of breath (Cough;). Please provided spacer  8.5 g  2  . aspirin 81 MG chewable tablet Chew 81 mg by mouth daily.      . baclofen (LIORESAL) 10 MG tablet TAKE 1 TABLET BY MOUTH TWICE DAILY  60 tablet  5  . benzonatate (TESSALON)  200 MG capsule Take 1 capsule (200 mg total) by mouth 3 (three) times daily as needed for cough.  20 capsule  0  . betamethasone valerate ointment (VALISONE) 0.1 % Apply topically 2 (two) times daily.  30 g  1  . calcitRIOL (ROCALTROL) 0.25 MCG capsule Take 1 capsule (0.25 mcg total) by mouth daily.  30 capsule  0  . calcium carbonate (TUMS - DOSED IN MG ELEMENTAL CALCIUM) 500 MG chewable tablet Chew 2 tablets (400 mg of elemental calcium total) by mouth 3 (three) times daily with meals.  180 tablet  0  . cephALEXin (KEFLEX) 500 MG capsule Take 1 capsule (500 mg total) by mouth 3 (three) times daily.  30 capsule  0  . cholecalciferol (VITAMIN D-400) 400 UNITS TABS Take 400 Units by mouth 2 (two) times daily.      . collagenase (SANTYL) ointment Apply topically daily.  30 g  1  . darbepoetin (ARANESP) 100 MCG/0.5ML SOLN Inject 0.5 mLs (100 mcg total) into the skin every Monday.  4.2 mL  0  . docusate sodium (COLACE) 250 MG capsule Take 1 capsule (250 mg total) by mouth 2 (two) times daily.  60 capsule  5  . erythromycin ophthalmic ointment       . furosemide (LASIX) 80 MG tablet fTake one hal tab or up to two tabs daily by mouth as directed by kidney doctor.  180 tablet  3  .  furosemide (LASIX) 80 MG tablet TAKE 2 TABLETS BY MOUTH THREE TIMES DAILY  180 tablet  12  . insulin glargine (LANTUS) 100 UNIT/ML injection Inject 0.06 mLs (6 Units total) into the skin daily.  30 mL  3  . insulin lispro (HUMALOG) 100 UNIT/ML injection Inject 5 Units into the skin 2 (two) times daily before lunch and supper. Per sliding scale.      . losartan-hydrochlorothiazide (HYZAAR) 100-12.5 MG per tablet       . metoprolol succinate (TOPROL-XL) 25 MG 24 hr tablet Take 1 tablet (25 mg total) by mouth daily.  90 tablet  3  . nicotine (NICODERM CQ - DOSED IN MG/24 HR) 7 mg/24hr patch Place 1 patch onto the skin daily.  30 patch  0  . ondansetron (ZOFRAN) 4 MG tablet Take 1 tablet (4 mg total) by mouth every 8 (eight) hours  as needed for nausea.  20 tablet  0  . oseltamivir (TAMIFLU) 30 MG capsule Take 1 capsule (30 mg total) by mouth daily.  5 capsule  0  . pravastatin (PRAVACHOL) 40 MG tablet Take 1 tablet (40 mg total) by mouth daily.  90 tablet  4  . simvastatin (ZOCOR) 80 MG tablet       . simvastatin (ZOCOR) 80 MG tablet TAKE 1 TABLET BY MOUTH AT BEDTIME  90 tablet  3  . sodium bicarbonate 650 MG tablet Take 1 tablet (650 mg total) by mouth 2 (two) times daily.  60 tablet  0  . sodium bicarbonate 650 MG tablet TAKE 2 TABLETS BY MOUTH TWICE DAILY  120 tablet  12  . sodium polystyrene (KAYEXALATE) 15 GM/60ML suspension Take 15 g by mouth See admin instructions. Takes every 3-4 days      . Spacer/Aero-Holding Chambers (BREATHERITE COLL SPACER ADULT) MISC 1 Device by Does not apply route once.  1 each  0  . tamsulosin (FLOMAX) 0.4 MG CAPS capsule Take 2 capsules (0.8 mg total) by mouth daily.  60 capsule  12  . triamcinolone cream (KENALOG) 0.1 % Apply topically 2 (two) times daily. Apply to affected areas as directed patient has written instructions  454 g  3  . vitamin C (ASCORBIC ACID) 500 MG tablet Take 500 mg by mouth daily.      . [DISCONTINUED] tiotropium (SPIRIVA HANDIHALER) 18 MCG inhalation capsule Place 1 capsule (18 mcg total) into inhaler and inhale daily.  30 capsule  2   No current facility-administered medications for this visit.   Facility-Administered Medications Ordered in Other Visits  Medication Dose Route Frequency Provider Last Rate Last Dose  . ferumoxytol (FERAHEME) 1,020 mg in sodium chloride 0.9 % 100 mL IVPB  1,020 mg Intravenous Once Louis Meckel, MD        Pt meds include: Statin :Yes Betablocker: Yes ASA: Yes Other anticoagulants/antiplatelets: no  Past Medical History  Diagnosis Date  . COPD (chronic obstructive pulmonary disease)   . Chronic back pain     Chronic narcotics for this for many years  . Diabetes mellitus   . Hyperlipidemia   . Hypertension   .  Foot pain   . Peripheral vascular disease   . Asthma   . Stroke     No residual "Mini strokes"  . Ulcer 1985  . Fracture of foot     [place]- Right  . Neuromuscular disorder     periferal neuropathy  . Chronic kidney disease     CKD stage III    Past Surgical  History  Procedure Laterality Date  . Neck surgery    . Eye surgery    . Cardiac catheterization    . Endarterectomy  12/18/2011    Procedure: ENDARTERECTOMY CAROTID;  Surgeon: Angelia Mould, MD;  Location: [facility];  Service: Vascular;  Laterality: Left;  . Carotid endarterectomy    . Axillary-femoral bypass graft  06/18/2012    Procedure: BYPASS GRAFT AXILLA-BIFEMORAL;  Surgeon: Angelia Mould, MD;  Location: [facility];  Service: Vascular;  Laterality: Right;  Right Axillo to Right Femoral Bypass Graft  . Femoral-popliteal bypass graft  06/18/2012    Procedure: BYPASS GRAFT FEMORAL-POPLITEAL ARTERY;  Surgeon: Angelia Mould, MD;  Location: [facility] OR;  Service: Vascular;  Laterality: Right;  Right Femoral to Above Knee Popliteal Bypass Graft  . Amputation  06/18/2012    Procedure: AMPUTATION DIGIT;  Surgeon: Angelia Mould, MD;  Location: [facility] OR;  Service: Vascular;  Laterality: Right;  Right side, amputation of fourth and fifth toes  . Av fistula placement  06/24/2012    Procedure: ARTERIOVENOUS (AV) FISTULA CREATION;  Surgeon: Angelia Mould, MD;  Location: [facility] OR;  Service: Vascular;  Laterality: Left;    Social History History  Substance Use Topics  . Smoking status: Former Smoker -- 0.50 packs/day for 40 years    Types: Cigarettes    Quit date: 05/28/2012  . Smokeless tobacco: Never Used  . Alcohol Use: No     Comment: Previous drinker - However, quit in 2006    Family History Family History  Problem Relation Age of Onset  . Diabetes Mother   . Heart disease Mother   . Hypertension Mother   . Heart attack Mother   . Diabetes Father   . Heart disease Father   . Hypertension Father   .  Heart attack Father   . Other Father     bleeding problems  . Diabetes Brother   . Heart disease Brother     Allergies  Allergen Reactions  . Nicoderm [Nicotine] Itching and Rash  . Gabapentin Other (See Comments)    Can't walk, shuts legs down  . Vicodin [Hydrocodone-Acetaminophen] Hives and Itching     REVIEW OF SYSTEMS: See HPI for pertinent positives and negatives.  Physical Examination  Filed Vitals:   09/07/13 1404  BP: 148/72  Pulse: 78  Resp: 16    Filed Weights   09/07/13 1404  Weight: 179 lb (81.194 kg)   Body mass index is 24.98 kg/(m^2).   General:  WDWN in NAD Gait: Normal HENT: WNL Eyes: Pupils equal Pulmonary: normal non-labored breathing , without Rales, rhonchi,  wheezing Cardiac: RRR, no murmurs detected  Abdomen: soft, NT, no masses Skin: no rashes, ulcers noted;  no Gangrene , no cellulitis; no open wounds;   VASCULAR EXAM  Carotid Bruits Left Right   Positive Positive                             VASCULAR EXAM: Extremities without ischemic changes  without Gangrene; without open wounds.  LE Pulses LEFT RIGHT       FEMORAL   palpable   palpable        POPLITEAL  not palpable   not palpable       POSTERIOR TIBIAL  not palpable   not palpable        DORSALIS PEDIS      ANTERIOR TIBIAL not palpable   palpable      Musculoskeletal: no muscle wasting or atrophy; pretibial pitting edema: right; 1+, left: 2+. Right 4th and 5th toes surgically absent. Neurologic: A&O X 3; Appropriate Affect ;  SENSATION: normal; MOTOR FUNCTION: 5/5 Symmetric, CN 2-12 intact Speech is fluent/normal   Non-Invasive Vascular Imaging (09/07/2013): CEREBROVASCULAR DUPLEX EVALUATION    INDICATION: Carotid artery disease    PREVIOUS INTERVENTION(S): Right axillary  to femoral bypass graft and right femoral to popliteal bypass graft. Left carotid  endarterectomy 12/18/2011    DUPLEX EXAM: Carotid duplex    RIGHT  LEFT  Peak Systolic Velocities (cm/s) End Diastolic Velocities (cm/s) Plaque LOCATION Peak Systolic Velocities (cm/s) End Diastolic Velocities (cm/s) Plaque  165 16 HT CCA PROXIMAL 115 25 -  105 17 HT CCA MID 71 20 -  66 10 HT CCA DISTAL 46 11 -  109 14 - ECA 105 11 -  55 11 HT ICA PROXIMAL 60 17 -  83 28 - ICA MID 108 33 -  97 29 - ICA DISTAL 100 34 -    .92 ICA / CCA Ratio (PSV) N/A  No flow proximally Vertebral Flow Antegrade  185 Brachial Systolic Pressure (mmHg) Fistula  Biphasic Brachial Artery Waveforms -    Plaque Morphology:  HM = Homogeneous, HT = Heterogeneous, CP = Calcific Plaque, SP = Smooth Plaque, IP = Irregular Plaque     ADDITIONAL FINDINGS:     IMPRESSION: 1. Less than 40% right internal carotid artery stenosis. 2. Patent left carotid endarterectomy site. 3. Possible proximal right vertebral artery occlusion, antegrade distally.    Compared to the previous exam:  No significant change.        ASSESSMENT:  John [name] is a 58 y.o. male who is s/p right axillofemoral bypass and right femoropopliteal bypass on 06/18/2012. He had right fourth and fifth toe amputations at that time. He is also s/p left CEA on 12/18/11, and left AV fistula creation on 06/24/2012. Less than 40% right internal carotid artery stenosis. Patent left carotid endarterectomy site. Possible proximal right vertebral artery occlusion, antegrade distally.  Both ABI indices do not correlate with the tibial waveforms, indicating the presence of medial calcification. Triphasic waveforms in right PT, biphasic in right DP, monophasic in left PT and DP.  PLAN:   He was counseled re smoking cessation. Consider using stationary bike on a regular basis since he has issues with legs stability and falling.  Based on today's exam and non-invasive vascular lab results, the patient will follow up in 6 months with the following tests  ABI's and RLE arterial Duplex,, one year for ABI's,  and carotid Duplex. I discussed in depth with the patient the nature of atherosclerosis, and emphasized the importance of maximal medical management including strict control of blood pressure, blood glucose, and lipid levels, obtaining regular exercise, and cessation of smoking.  The patient is aware that without maximal medical management the underlying atherosclerotic disease process will progress, limiting the benefit of any interventions. The patient was given information about stroke prevention and what symptoms should prompt the patient to seek immediate medical care.  The patient was given information about PAD including signs, symptoms, treatment, what symptoms should prompt the patient to seek immediate medical care, and risk reduction measures to take. Thank you for allowing [place] to participate in this patient's care.  Clemon Chambers, RN, MSN, FNP-C [facility] Office: [phone]  Clinic MD: Scot Dock 09/07/2013 2:00 PM

## 2013-09-07 NOTE — Patient Instructions (Addendum)
Stroke Prevention Some medical conditions and behaviors are associated with an increased chance of having a stroke. You may prevent a stroke by making healthy choices and managing medical conditions. HOW CAN I REDUCE MY RISK OF HAVING A STROKE?   Stay physically active. Get at least 30 minutes of activity on most or all days.  Do not smoke. It may also be helpful to avoid exposure to secondhand smoke.  Limit alcohol use. Moderate alcohol use is considered to be:  No more than 2 drinks per day for men.  No more than 1 drink per day for nonpregnant women.  Eat healthy foods. This involves  Eating 5 or more servings of fruits and vegetables a day.  Following a diet that addresses high blood pressure (hypertension), high cholesterol, diabetes, or obesity.  Manage your cholesterol levels.  A diet low in saturated fat, trans fat, and cholesterol and high in fiber may control cholesterol levels.  Take any prescribed medicines to control cholesterol as directed by your health care provider.  Manage your diabetes.  A controlled-carbohydrate, controlled-sugar diet is recommended to manage diabetes.  Take any prescribed medicines to control diabetes as directed by your health care provider.  Control your hypertension.  A low-salt (sodium), low-saturated fat, low-trans fat, and low-cholesterol diet is recommended to manage hypertension.  Take any prescribed medicines to control hypertension as directed by your health care provider.  Maintain a healthy weight.  A reduced-calorie, low-sodium, low-saturated fat, low-trans fat, low-cholesterol diet is recommended to manage weight.  Stop drug abuse.  Avoid taking birth control pills.  Talk to your health care provider about the risks of taking birth control pills if you are over 35 years old, smoke, get migraines, or have ever had a blood clot.  Get evaluated for sleep disorders (sleep apnea).  Talk to your health care provider about  getting a sleep evaluation if you snore a lot or have excessive sleepiness.  Take medicines as directed by your health care provider.  For some people, aspirin or blood thinners (anticoagulants) are helpful in reducing the risk of forming abnormal blood clots that can lead to stroke. If you have the irregular heart rhythm of atrial fibrillation, you should be on a blood thinner unless there is a good reason you cannot take them.  Understand all your medicine instructions.  Make sure that other other conditions (such as anemia or atherosclerosis) are addressed. SEEK IMMEDIATE MEDICAL CARE IF:   You have sudden weakness or numbness of the face, arm, or leg, especially on one side of the body.  Your face or eyelid droops to one side.  You have sudden confusion.  You have trouble speaking (aphasia) or understanding.  You have sudden trouble seeing in one or both eyes.  You have sudden trouble walking.  You have dizziness.  You have a loss of balance or coordination.  You have a sudden, severe headache with no known cause.  You have new chest pain or an irregular heartbeat. Any of these symptoms may represent a serious problem that is an emergency. Do not wait to see if the symptoms will go away. Get medical help at once. Call your local emergency services  (911 in [place] Do not drive yourself to the hospital. Document Released: 07/03/2004 Document Revised: 03/16/2013 Document Reviewed: 11/26/2012 ExitCare Patient Information 2014 [employer].   Peripheral Vascular Disease Peripheral Vascular Disease (PVD), also called Peripheral Arterial Disease (PAD), is a circulation problem caused by cholesterol (atherosclerotic plaque) deposits in the   arteries. PVD commonly occurs in the lower extremities (legs) but it can occur in other areas of the body, such as your arms. The cholesterol buildup in the arteries reduces blood flow which can cause pain and other serious problems. The presence  of PVD can place a person at risk for Coronary Artery Disease (CAD).  CAUSES  Causes of PVD can be many. It is usually associated with more than one risk factor such as:   High Cholesterol.  Smoking.  Diabetes.  Lack of exercise or inactivity.  High blood pressure (hypertension).  Obesity.  Family history. SYMPTOMS   When the lower extremities are affected, patients with PVD may experience:  Leg pain with exertion or physical activity. This is called INTERMITTENT CLAUDICATION. This may present as cramping or numbness with physical activity. The location of the pain is associated with the level of blockage. For example, blockage at the abdominal level (distal abdominal aorta) may result in buttock or hip pain. Lower leg arterial blockage may result in calf pain.  As PVD becomes more severe, pain can develop with less physical activity.  In people with severe PVD, leg pain may occur at rest.  Other PVD signs and symptoms:  Leg numbness or weakness.  Coldness in the affected leg or foot, especially when compared to the other leg.  A change in leg color.  Patients with significant PVD are more prone to ulcers or sores on toes, feet or legs. These may take longer to heal or may reoccur. The ulcers or sores can become infected.  If signs and symptoms of PVD are ignored, gangrene may occur. This can result in the loss of toes or loss of an entire limb.  Not all leg pain is related to PVD. Other medical conditions can cause leg pain such as:  Blood clots (embolism) or Deep Vein Thrombosis.  Inflammation of the blood vessels (vasculitis).  Spinal stenosis. DIAGNOSIS  Diagnosis of PVD can involve several different types of tests. These can include:  Pulse Volume Recording Method (PVR). This test is simple, painless and does not involve the use of X-rays. PVR involves measuring and comparing the blood pressure in the arms and legs. An ABI (Ankle-Brachial Index) is calculated.  The normal ratio of blood pressures is 1. As this number becomes smaller, it indicates more severe disease.  < 0.95  indicates significant narrowing in one or more leg vessels.  <0.8 there will usually be pain in the foot, leg or buttock with exercise.  <0.4 will usually have pain in the legs at rest.  <0.25  usually indicates limb threatening PVD.  Doppler detection of pulses in the legs. This test is painless and checks to see if you have a pulses in your legs/feet.  A dye or contrast material (a substance that highlights the blood vessels so they show up on x-ray) may be given to help your caregiver better see the arteries for the following tests. The dye is eliminated from your body by the kidney's. Your caregiver may order blood work to check your kidney function and other laboratory values before the following tests are performed:  Magnetic Resonance Angiography (MRA). An MRA is a picture study of the blood vessels and arteries. The MRA machine uses a large magnet to produce images of the blood vessels.  Computed Tomography Angiography (CTA). A CTA is a specialized x-ray that looks at how the blood flows in your blood vessels. An IV may be inserted into your arm so contrast dye   can be injected.  Angiogram. Is a procedure that uses x-rays to look at your blood vessels. This procedure is minimally invasive, meaning a small incision (cut) is made in your groin. A small tube (catheter) is then inserted into the artery of your groin. The catheter is guided to the blood vessel or artery your caregiver wants to examine. Contrast dye is injected into the catheter. X-rays are then taken of the blood vessel or artery. After the images are obtained, the catheter is taken out. TREATMENT  Treatment of PVD involves many interventions which may include:  Lifestyle changes:  Quitting smoking.  Exercise.  Following a low fat, low cholesterol diet.  Control of diabetes.  Foot care is very  important to the PVD patient. Good foot care can help prevent infection.  Medication:  Cholesterol-lowering medicine.  Blood pressure medicine.  Anti-platelet drugs.  Certain medicines may reduce symptoms of Intermittent Claudication.  Interventional/Surgical options:  Angioplasty. An Angioplasty is a procedure that inflates a balloon in the blocked artery. This opens the blocked artery to improve blood flow.  Stent Implant. A wire mesh tube (stent) is placed in the artery. The stent expands and stays in place, allowing the artery to remain open.  Peripheral Bypass Surgery. This is a surgical procedure that reroutes the blood around a blocked artery to help improve blood flow. This type of procedure may be performed if Angioplasty or stent implants are not an option. SEEK IMMEDIATE MEDICAL CARE IF:   You develop pain or numbness in your arms or legs.  Your arm or leg turns cold, becomes blue in color.  You develop redness, warmth, swelling and pain in your arms or legs. MAKE SURE YOU:   Understand these instructions.  Will watch your condition.  Will get help right away if you are not doing well or get worse. Document Released: 07/03/2004 Document Revised: 08/18/2011 Document Reviewed: 05/30/2008 ExitCare Patient Information 2014 [employer].   Smoking Cessation Quitting smoking is important to your health and has many advantages. However, it is not always easy to quit since nicotine is a very addictive drug. Often times, people try 3 times or more before being able to quit. This document explains the best ways for you to prepare to quit smoking. Quitting takes hard work and a lot of effort, but you can do it. ADVANTAGES OF QUITTING SMOKING  You will live longer, feel better, and live better.  Your body will feel the impact of quitting smoking almost immediately.  Within 20 minutes, blood pressure decreases. Your pulse returns to its normal level.  After 8 hours,  carbon monoxide levels in the blood return to normal. Your oxygen level increases.  After 24 hours, the chance of having a heart attack starts to decrease. Your breath, hair, and body stop smelling like smoke.  After 48 hours, damaged nerve endings begin to recover. Your sense of taste and smell improve.  After 72 hours, the body is virtually free of nicotine. Your bronchial tubes relax and breathing becomes easier.  After 2 to 12 weeks, lungs can hold more air. Exercise becomes easier and circulation improves.  The risk of having a heart attack, stroke, cancer, or lung disease is greatly reduced.  After 1 year, the risk of coronary heart disease is cut in half.  After 5 years, the risk of stroke falls to the same as a nonsmoker.  After 10 years, the risk of lung cancer is cut in half and the risk of other cancers   decreases significantly.  After 15 years, the risk of coronary heart disease drops, usually to the level of a nonsmoker.  If you are pregnant, quitting smoking will improve your chances of having a healthy baby.  The people you live with, especially any children, will be healthier.  You will have extra money to spend on things other than cigarettes. QUESTIONS TO THINK ABOUT BEFORE ATTEMPTING TO QUIT You may want to talk about your answers with your caregiver.  Why do you want to quit?  If you tried to quit in the past, what helped and what did not?  What will be the most difficult situations for you after you quit? How will you plan to handle them?  Who can help you through the tough times? Your family? Friends? A caregiver?  What pleasures do you get from smoking? What ways can you still get pleasure if you quit? Here are some questions to ask your caregiver:  How can you help me to be successful at quitting?  What medicine do you think would be best for me and how should I take it?  What should I do if I need more help?  What is smoking withdrawal like? How  can I get information on withdrawal? GET READY  Set a quit date.  Change your environment by getting rid of all cigarettes, ashtrays, matches, and lighters in your home, car, or work. Do not let people smoke in your home.  Review your past attempts to quit. Think about what worked and what did not. GET SUPPORT AND ENCOURAGEMENT You have a better chance of being successful if you have help. You can get support in many ways.  Tell your family, friends, and co-workers that you are going to quit and need their support. Ask them not to smoke around you.  Get individual, group, or telephone counseling and support. Programs are available at local hospitals and health centers. Call your local health department for information about programs in your area.  Spiritual beliefs and practices may help some smokers quit.  Download a "quit meter" on your computer to keep track of quit statistics, such as how long you have gone without smoking, cigarettes not smoked, and money saved.  Get a self-help book about quitting smoking and staying off of tobacco. LEARN NEW SKILLS AND BEHAVIORS  Distract yourself from urges to smoke. Talk to someone, go for a walk, or occupy your time with a task.  Change your normal routine. Take a different route to work. Drink tea instead of coffee. Eat breakfast in a different place.  Reduce your stress. Take a hot bath, exercise, or read a book.  Plan something enjoyable to do every day. Reward yourself for not smoking.  Explore interactive web-based programs that specialize in helping you quit. GET MEDICINE AND USE IT CORRECTLY Medicines can help you stop smoking and decrease the urge to smoke. Combining medicine with the above behavioral methods and support can greatly increase your chances of successfully quitting smoking.  Nicotine replacement therapy helps deliver nicotine to your body without the negative effects and risks of smoking. Nicotine replacement therapy  includes nicotine gum, lozenges, inhalers, nasal sprays, and skin patches. Some may be available over-the-counter and others require a prescription.  Antidepressant medicine helps people abstain from smoking, but how this works is unknown. This medicine is available by prescription.  Nicotinic receptor partial agonist medicine simulates the effect of nicotine in your brain. This medicine is available by prescription. Ask your caregiver for   advice about which medicines to use and how to use them based on your health history. Your caregiver will tell you what side effects to look out for if you choose to be on a medicine or therapy. Carefully read the information on the package. Do not use any other product containing nicotine while using a nicotine replacement product.  RELAPSE OR DIFFICULT SITUATIONS Most relapses occur within the first 3 months after quitting. Do not be discouraged if you start smoking again. Remember, most people try several times before finally quitting. You may have symptoms of withdrawal because your body is used to nicotine. You may crave cigarettes, be irritable, feel very hungry, cough often, get headaches, or have difficulty concentrating. The withdrawal symptoms are only temporary. They are strongest when you first quit, but they will go away within 10 14 days. To reduce the chances of relapse, try to:  Avoid drinking alcohol. Drinking lowers your chances of successfully quitting.  Reduce the amount of caffeine you consume. Once you quit smoking, the amount of caffeine in your body increases and can give you symptoms, such as a rapid heartbeat, sweating, and anxiety.  Avoid smokers because they can make you want to smoke.  Do not let weight gain distract you. Many smokers will gain weight when they quit, usually less than 10 pounds. Eat a healthy diet and stay active. You can always lose the weight gained after you quit.  Find ways to improve your mood other than  smoking. FOR MORE INFORMATION  www.smokefree.gov  Document Released: 05/20/2001 Document Revised: 11/25/2011 Document Reviewed: 09/04/2011 ExitCare Patient Information 2014 [employer].  

## 2013-09-13 ENCOUNTER — Telehealth: Payer: Self-pay | Admitting: Family Medicine

## 2013-09-13 NOTE — Telephone Encounter (Signed)
Refill request for pain meds 

## 2013-09-13 NOTE — Telephone Encounter (Signed)
Will forward to MD, patient has appointment scheduled for 4/15

## 2013-09-14 MED ORDER — OXYCODONE-ACETAMINOPHEN 7.5-325 MG PO TABS: ORAL_TABLET | ORAL | Status: DC

## 2013-09-14 NOTE — Telephone Encounter (Signed)
Dear John [name] Team Please tell  Hmi, it is at front desk [facility]! [name]

## 2013-09-14 NOTE — Telephone Encounter (Signed)
Patient informed. 

## 2013-09-16 ENCOUNTER — Telehealth: Payer: Self-pay | Admitting: Family Medicine

## 2013-09-16 MED ORDER — SODIUM POLYSTYRENE SULFONATE 15 GM/60ML PO SUSP: 15.0000 g | ORAL | Status: DC

## 2013-09-16 NOTE — Telephone Encounter (Signed)
Pt called and needs a refill on his potassium.jw

## 2013-09-16 NOTE — Telephone Encounter (Signed)
Refilled for 1 month and patient notified

## 2013-09-21 ENCOUNTER — Ambulatory Visit (INDEPENDENT_AMBULATORY_CARE_PROVIDER_SITE_OTHER): Payer: Medicare Other | Admitting: Family Medicine

## 2013-09-21 ENCOUNTER — Encounter: Payer: Self-pay | Admitting: Family Medicine

## 2013-09-21 VITALS — BP 142/63 | HR 98 | Temp 98.1°F | Ht 71.0 in | Wt 188.4 lb

## 2013-09-21 DIAGNOSIS — F17200 Nicotine dependence, unspecified, uncomplicated: Secondary | ICD-10-CM

## 2013-09-21 DIAGNOSIS — I70219 Atherosclerosis of native arteries of extremities with intermittent claudication, unspecified extremity: Secondary | ICD-10-CM

## 2013-09-21 DIAGNOSIS — N184 Chronic kidney disease, stage 4 (severe): Secondary | ICD-10-CM

## 2013-09-21 LAB — RENAL FUNCTION PANEL
ALBUMIN: 3.7 g/dL (ref 3.5–5.2)
BUN: 88 mg/dL — ABNORMAL HIGH (ref 6–23)
CO2: 14 meq/L — AB (ref 19–32)
Calcium: 8 mg/dL — ABNORMAL LOW (ref 8.4–10.5)
Chloride: 118 mEq/L — ABNORMAL HIGH (ref 96–112)
Creat: 5.14 mg/dL — ABNORMAL HIGH (ref 0.50–1.35)
Glucose, Bld: 93 mg/dL (ref 70–99)
Phosphorus: 5.9 mg/dL — ABNORMAL HIGH (ref 2.3–4.6)
Potassium: 6.2 mEq/L — ABNORMAL HIGH (ref 3.5–5.3)
Sodium: 144 mEq/L (ref 135–145)

## 2013-09-21 LAB — IBC PANEL
%SAT: 28 % (ref 20–55)
TIBC: 235 ug/dL (ref 215–435)
UIBC: 170 ug/dL (ref 125–400)

## 2013-09-21 LAB — FERRITIN: Ferritin: 790 ng/mL — ABNORMAL HIGH (ref 22–322)

## 2013-09-21 LAB — IRON: Iron: 65 ug/dL (ref 42–165)

## 2013-09-22 LAB — PTH, INTACT AND CALCIUM
Calcium: 8.1 mg/dL — ABNORMAL LOW (ref 8.4–10.5)
PTH: 392.7 pg/mL — AB (ref 14.0–72.0)

## 2013-09-22 NOTE — Assessment & Plan Note (Signed)
Counseling regarding smoking cessation. Gave him prescription for [Chantix. Also gave him some other information and encouraged him and his endeavor to stop smoking. Given his underlying medical issues this is extremely important

## 2013-09-22 NOTE — Assessment & Plan Note (Signed)
We'll get labs today and faxed to nephrology clinic. Did refill his Kayexalate.

## 2013-09-22 NOTE — Assessment & Plan Note (Signed)
Reviewed recent vascular note in the chart and updated overview.

## 2013-09-22 NOTE — Progress Notes (Signed)
   Subjective:    Patient ID: John [name], male    DOB: 1955/12/26, 58 y.o.   MRN: [id number]  HPI Followup chronic medical problems, specifically renal disease. He is concerned that  his potassium was elevated. Needs a refill on Kayexalate. 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  Also wants to discuss smoking cessation. He's been intermittently smoking. He previously used Chantix with some success. Finances are an issue   Review of Systems Pertinent review of systems: negative for fever or unusual weight change.    Objective:   Physical Exam Vital signs reviewed. GENERAL: Well-developed, well-nourished, no acute distress. CARDIOVASCULAR: Regular rate and rhythm no murmur gallop or rub LUNGS: Clear to auscultation bilaterally, no rales or wheeze. ABDOMEN: Soft positive bowel sounds NEURO: Decreased soft touch sensation bilateral lower extremity from about the mid shin down. MSK: Movement of extremity x 4. Antalgic gait.         Assessment & Plan:

## 2013-10-13 ENCOUNTER — Other Ambulatory Visit: Payer: Self-pay | Admitting: Family Medicine

## 2013-10-13 ENCOUNTER — Telehealth: Payer: Self-pay | Admitting: *Deleted

## 2013-10-13 MED ORDER — VARENICLINE TARTRATE 1 MG PO TABS: 1.0000 mg | ORAL_TABLET | Freq: Two times a day (BID) | ORAL | Status: DC

## 2013-10-13 MED ORDER — VARENICLINE TARTRATE 0.5 MG X 11 & 1 MG X 42 PO MISC: ORAL | Status: DC

## 2013-10-13 MED ORDER — OXYCODONE-ACETAMINOPHEN 7.5-325 MG PO TABS: ORAL_TABLET | ORAL | Status: DC

## 2013-10-13 NOTE — Telephone Encounter (Signed)
Both Chantix called in spoke to Scoot at [place] and Mr Pickel was also informed of both Chantix and prescription left up front for pick up.[place]

## 2013-10-13 NOTE — Progress Notes (Unsigned)
Dear Dema Severin Team Please call in BOTH of the chantix rx---one is for first month and the other is the continuing dose. Also tell him his pain med rx is at front desk THANKS! [name]

## 2013-11-16 ENCOUNTER — Telehealth: Payer: Self-pay | Admitting: Family Medicine

## 2013-11-16 ENCOUNTER — Other Ambulatory Visit: Payer: Self-pay | Admitting: Family Medicine

## 2013-11-16 MED ORDER — OXYCODONE-ACETAMINOPHEN 7.5-325 MG PO TABS: ORAL_TABLET | ORAL | Status: DC

## 2013-11-30 NOTE — Telephone Encounter (Signed)
Called to schedule IAWV. When pt returns call please schedule appt. for 60 minutes.

## 2013-12-02 ENCOUNTER — Other Ambulatory Visit: Payer: Self-pay | Admitting: Family Medicine

## 2013-12-15 ENCOUNTER — Encounter: Payer: Self-pay | Admitting: Home Health Services

## 2013-12-15 ENCOUNTER — Ambulatory Visit (INDEPENDENT_AMBULATORY_CARE_PROVIDER_SITE_OTHER): Payer: Medicare Other | Admitting: Home Health Services

## 2013-12-15 VITALS — BP 153/72 | HR 78 | Temp 98.6°F | Ht 71.0 in | Wt 176.0 lb

## 2013-12-15 DIAGNOSIS — Z0000 Encounter for general adult medical examination without abnormal findings: Secondary | ICD-10-CM

## 2013-12-15 NOTE — Progress Notes (Signed)
Patient here for annual wellness visit, patient reports: Risk Factors/Conditions needing evaluation or treatment: Pt. Does not have any risk factors that need evaluation.  Home Safety: Pt lives at home, with wife in a 1 story home.  Pt reports having smoke alarms. Other Information: Corrective lens: Pt does not wear corrective lens, has annual eye exams. Dentures: Pt has upper dentures, has irregular dental exams. Memory: Pt denies memory problems.  Patient's Mini Mental Score (recorded in doc. flowsheet): 30 Bladder:  Pt denies problems with bladder control.  BMI/Exercise: We discussed BMI and strategies for weight maintenace including increasing fruits and vegetable, limiting fried foods and sweets.  We also discussed  starting a regular exercise routine at the [facility]. Med Adherence:  We discussed importance of taking medications for htn and cholesterol.  Pt reports missing 0 day in the past week.  ADL/IADL:  Pt reports independence in all functions. Balance/Gait: Pt reports 1 falls in the past year.  We discussed home safety and fall prevention.  Pt uses a cane for walking   Annual Wellness Visit Requirements Recorded Today In  Medical, family, social history Past Medical, Family, Social History Section  Current providers Care team  Current medications Medications  Wt, BP, Ht, BMI Vital signs  Hearing assessment (welcome visit) Hearing/vision  Tobacco, alcohol, illicit drug use History  ADL Nurse Assessment  Depression Screening Nurse Assessment  Cognitive impairment Nurse Assessment  Mini Mental Status Document Flowsheet  Fall Risk Fall/Depression  Home Safety Progress Note  End of Life Planning (welcome visit) Social Documentation  Medicare preventative services Progress Note  Risk factors/conditions needing evaluation/treatment Progress Note  Personalized health advice Patient Instructions, goals, letter  Diet & Exercise Social Documentation  Emergency Contact Social  Documentation  Seat Belts Social Documentation  Sun exposure/protection Social Documentation

## 2013-12-21 ENCOUNTER — Other Ambulatory Visit: Payer: Self-pay | Admitting: Family Medicine

## 2013-12-21 MED ORDER — OXYCODONE-ACETAMINOPHEN 7.5-325 MG PO TABS: ORAL_TABLET | ORAL | Status: DC

## 2013-12-26 ENCOUNTER — Other Ambulatory Visit: Payer: Self-pay | Admitting: Family Medicine

## 2013-12-28 ENCOUNTER — Ambulatory Visit (INDEPENDENT_AMBULATORY_CARE_PROVIDER_SITE_OTHER): Payer: Medicare Other | Admitting: Family Medicine

## 2013-12-28 ENCOUNTER — Encounter: Payer: Self-pay | Admitting: Family Medicine

## 2013-12-28 VITALS — BP 136/82 | HR 71 | Temp 98.5°F | Ht 71.0 in | Wt 170.0 lb

## 2013-12-28 DIAGNOSIS — E1165 Type 2 diabetes mellitus with hyperglycemia: Secondary | ICD-10-CM

## 2013-12-28 DIAGNOSIS — M549 Dorsalgia, unspecified: Secondary | ICD-10-CM

## 2013-12-28 DIAGNOSIS — IMO0002 Reserved for concepts with insufficient information to code with codable children: Secondary | ICD-10-CM

## 2013-12-28 DIAGNOSIS — E118 Type 2 diabetes mellitus with unspecified complications: Principal | ICD-10-CM

## 2013-12-28 DIAGNOSIS — N184 Chronic kidney disease, stage 4 (severe): Secondary | ICD-10-CM

## 2013-12-28 LAB — POCT GLYCOSYLATED HEMOGLOBIN (HGB A1C): Hemoglobin A1C: 5.8

## 2013-12-28 MED ORDER — SODIUM POLYSTYRENE SULFONATE 15 GM/60ML PO SUSP: 15.0000 g | ORAL | Status: DC

## 2013-12-28 MED ORDER — OXYCODONE-ACETAMINOPHEN 7.5-325 MG PO TABS: ORAL_TABLET | ORAL | Status: DC

## 2013-12-28 NOTE — Progress Notes (Signed)
   Subjective:    Patient ID: John [name], male    DOB: Jun 12, 1955, 58 y.o.   MRN: [id number]  HPI #1 followup chronic pain. Doing pretty well with his lower extremity pain and back pain. Medications keeps him as a 4/10 rather than a 7 or 8/10.  #2. Diabetes mellitus. A1c today. He's had no problems with his medicines blood sugars have been excellent by his report. #3. Chronic renal insufficiency. He's been following with the kidney doctor on a regular basis. No lower extremity swelling. Still makes urine regularly. #4. Hypertension. With well-controlled. No episodes of chest pain, no lower extremity edema, no shortness of breath. He still active going fishing several times a week.   Review of Systems See history of present illness above. Additional pertinent review of systems negative for fever, sweats, chills, unusual weight change.    Objective:   Physical Exam  Vital signs reviewed. GENERAL: Well-developed, well-nourished, no acute distress. CARDIOVASCULAR: Regular rate and rhythm no murmur gallop or rub LUNGS: Clear to auscultation bilaterally, no rales or wheeze. ABDOMEN: Soft positive bowel sounds NEURO: Left eye blindness. Left lower extremity weakness. Bilateral lower extremity decreased sensation to soft touch particularly below the knees. MSK: Movement of extremity x 4. Left lower extremity 4/5 hip flexor and knee flexion extension. Slight decrease in plantarflexion dorsiflexion compared with the right, 4-5/5 on the left. He is walking with a cane and an antalgic gait to        Assessment & Plan:

## 2013-12-29 NOTE — Progress Notes (Signed)
Patient ID: John [name], male   DOB: 1955/08/18, 58 y.o.   MRN: [id number] I have reviewed this visit and discussed with Lamont Dowdy and agree with her documentation.

## 2014-01-03 ENCOUNTER — Telehealth: Payer: Self-pay | Admitting: *Deleted

## 2014-01-03 NOTE — Telephone Encounter (Signed)
Patient was in clinic today requesting abx for his sinus ifection

## 2014-01-04 MED ORDER — SULFAMETHOXAZOLE-TMP DS 800-160 MG PO TABS: 1.0000 | ORAL_TABLET | Freq: Two times a day (BID) | ORAL | Status: DC

## 2014-01-04 NOTE — Telephone Encounter (Signed)
Dear John [name] John [name] it is called in [place]! John [name]

## 2014-01-04 NOTE — Telephone Encounter (Signed)
Spoke with patient and informed him of below 

## 2014-01-13 ENCOUNTER — Other Ambulatory Visit: Payer: Self-pay | Admitting: Family Medicine

## 2014-01-24 ENCOUNTER — Encounter (HOSPITAL_COMMUNITY): Payer: Self-pay | Admitting: Emergency Medicine

## 2014-01-24 ENCOUNTER — Emergency Department (HOSPITAL_COMMUNITY)
Admission: EM | Admit: 2014-01-24 | Discharge: 2014-01-24 | Disposition: A | Discharge status: Home / Self Care | Payer: Medicare Other | Attending: Emergency Medicine | Admitting: Emergency Medicine

## 2014-01-24 ENCOUNTER — Emergency Department (HOSPITAL_COMMUNITY): Payer: Medicare Other

## 2014-01-24 ENCOUNTER — Telehealth: Payer: Self-pay | Admitting: Family Medicine

## 2014-01-24 DIAGNOSIS — R079 Chest pain, unspecified: Secondary | ICD-10-CM

## 2014-01-24 DIAGNOSIS — Z8781 Personal history of (healed) traumatic fracture: Secondary | ICD-10-CM | POA: Insufficient documentation

## 2014-01-24 DIAGNOSIS — E119 Type 2 diabetes mellitus without complications: Secondary | ICD-10-CM | POA: Diagnosis not present

## 2014-01-24 DIAGNOSIS — Z8673 Personal history of transient ischemic attack (TIA), and cerebral infarction without residual deficits: Secondary | ICD-10-CM | POA: Insufficient documentation

## 2014-01-24 DIAGNOSIS — Z8669 Personal history of other diseases of the nervous system and sense organs: Secondary | ICD-10-CM | POA: Diagnosis not present

## 2014-01-24 DIAGNOSIS — Z9889 Other specified postprocedural states: Secondary | ICD-10-CM | POA: Diagnosis not present

## 2014-01-24 DIAGNOSIS — G8929 Other chronic pain: Secondary | ICD-10-CM | POA: Diagnosis not present

## 2014-01-24 DIAGNOSIS — Z7982 Long term (current) use of aspirin: Secondary | ICD-10-CM | POA: Insufficient documentation

## 2014-01-24 DIAGNOSIS — J4489 Other specified chronic obstructive pulmonary disease: Secondary | ICD-10-CM | POA: Insufficient documentation

## 2014-01-24 DIAGNOSIS — I129 Hypertensive chronic kidney disease with stage 1 through stage 4 chronic kidney disease, or unspecified chronic kidney disease: Secondary | ICD-10-CM | POA: Diagnosis not present

## 2014-01-24 DIAGNOSIS — F17200 Nicotine dependence, unspecified, uncomplicated: Secondary | ICD-10-CM | POA: Insufficient documentation

## 2014-01-24 DIAGNOSIS — J449 Chronic obstructive pulmonary disease, unspecified: Secondary | ICD-10-CM | POA: Insufficient documentation

## 2014-01-24 DIAGNOSIS — Z794 Long term (current) use of insulin: Secondary | ICD-10-CM | POA: Insufficient documentation

## 2014-01-24 DIAGNOSIS — Z792 Long term (current) use of antibiotics: Secondary | ICD-10-CM | POA: Insufficient documentation

## 2014-01-24 DIAGNOSIS — M7989 Other specified soft tissue disorders: Secondary | ICD-10-CM | POA: Diagnosis not present

## 2014-01-24 DIAGNOSIS — Z872 Personal history of diseases of the skin and subcutaneous tissue: Secondary | ICD-10-CM | POA: Diagnosis not present

## 2014-01-24 DIAGNOSIS — N186 End stage renal disease: Secondary | ICD-10-CM | POA: Insufficient documentation

## 2014-01-24 DIAGNOSIS — E785 Hyperlipidemia, unspecified: Secondary | ICD-10-CM | POA: Insufficient documentation

## 2014-01-24 DIAGNOSIS — IMO0002 Reserved for concepts with insufficient information to code with codable children: Secondary | ICD-10-CM | POA: Diagnosis not present

## 2014-01-24 LAB — COMPREHENSIVE METABOLIC PANEL
ALK PHOS: 102 U/L (ref 39–117)
ALT: 15 U/L (ref 0–53)
AST: 26 U/L (ref 0–37)
Albumin: 4 g/dL (ref 3.5–5.2)
Anion gap: 16 — ABNORMAL HIGH (ref 5–15)
BILIRUBIN TOTAL: 0.3 mg/dL (ref 0.3–1.2)
BUN: 72 mg/dL — ABNORMAL HIGH (ref 6–23)
CO2: 30 meq/L (ref 19–32)
Calcium: 8.6 mg/dL (ref 8.4–10.5)
Chloride: 100 mEq/L (ref 96–112)
Creatinine, Ser: 6.96 mg/dL — ABNORMAL HIGH (ref 0.50–1.35)
GFR, EST AFRICAN AMERICAN: 9 mL/min — AB (ref >90)
GFR, EST NON AFRICAN AMERICAN: 8 mL/min — AB (ref >90)
GLUCOSE: 174 mg/dL — AB (ref 70–99)
POTASSIUM: 4.9 meq/L (ref 3.7–5.3)
SODIUM: 146 meq/L (ref 137–147)
Total Protein: 6.8 g/dL (ref 6.0–8.3)

## 2014-01-24 LAB — CBC WITH DIFFERENTIAL/PLATELET
BASOS PCT: 0 % (ref 0–1)
Basophils Absolute: 0 10*3/uL (ref 0.0–0.1)
EOS ABS: 0.2 10*3/uL (ref 0.0–0.7)
Eosinophils Relative: 3 % (ref 0–5)
HCT: 28.5 % — ABNORMAL LOW (ref 39.0–52.0)
Hemoglobin: 8.8 g/dL — ABNORMAL LOW (ref 13.0–17.0)
Lymphocytes Relative: 22 % (ref 12–46)
Lymphs Abs: 1.8 10*3/uL (ref 0.7–4.0)
MCH: 30 pg (ref 26.0–34.0)
MCHC: 30.9 g/dL (ref 30.0–36.0)
MCV: 97.3 fL (ref 78.0–100.0)
Monocytes Absolute: 0.6 10*3/uL (ref 0.1–1.0)
Monocytes Relative: 8 % (ref 3–12)
Neutro Abs: 5.3 10*3/uL (ref 1.7–7.7)
Neutrophils Relative %: 67 % (ref 43–77)
PLATELETS: 140 10*3/uL — AB (ref 150–400)
RBC: 2.93 MIL/uL — ABNORMAL LOW (ref 4.22–5.81)
RDW: 13.4 % (ref 11.5–15.5)
WBC: 7.8 10*3/uL (ref 4.0–10.5)

## 2014-01-24 LAB — I-STAT TROPONIN, ED: TROPONIN I, POC: 0.04 ng/mL (ref 0.00–0.08)

## 2014-01-24 MED ORDER — LORAZEPAM 0.5 MG PO TABS: 1.0000 mg | ORAL_TABLET | Freq: Three times a day (TID) | ORAL | Status: DC | PRN

## 2014-01-24 MED ORDER — LORAZEPAM 1 MG PO TABS
0.5000 mg | ORAL_TABLET | Freq: Once | ORAL | Status: AC
  Administered 2014-01-24: 0.5 mg via ORAL
  Filled 2014-01-24: qty 1

## 2014-01-24 NOTE — ED Provider Notes (Signed)
CSN: [id number]     Arrival date & time 01/24/14  1945 History   First MD Initiated Contact with Patient 01/24/14 2057     Chief Complaint  Patient presents with  . Arm Swelling  . Vascular Access Problem  . Chest Pain     (Consider location/radiation/quality/duration/timing/severity/associated sxs/prior Treatment) Patient is a 58 y.o. male presenting with chest pain. The history is provided by the patient. No language interpreter was used.  Chest Pain Pain location:  L chest and R chest Pain quality: tightness   Pain radiates to:  Does not radiate Pain radiates to the back: no   Associated symptoms: no abdominal pain, no cough, no fever, no shortness of breath, not vomiting and no weakness   Associated symptoms comment:  Chest tightness and left arm pain at the site of his dialysis graft that started today while dialyzing for the first time. No fever, cough, vomiting.    Past Medical History  Diagnosis Date  . COPD (chronic obstructive pulmonary disease)   . Chronic back pain     Chronic narcotics for this for many years  . Diabetes mellitus   . Hyperlipidemia   . Hypertension   . Foot pain   . Peripheral vascular disease   . Asthma   . Stroke     No residual "Mini strokes"  . Ulcer 1985  . Fracture of foot     [place]- Right  . Neuromuscular disorder     periferal neuropathy  . Chronic kidney disease     CKD stage III   Past Surgical History  Procedure Laterality Date  . Neck surgery    . Eye surgery    . Cardiac catheterization    . Endarterectomy  12/18/2011    Procedure: ENDARTERECTOMY CAROTID;  Surgeon: Angelia Mould, MD;  Location: [place];  Service: Vascular;  Laterality: Left;  . Carotid endarterectomy    . Axillary-femoral bypass graft  06/18/2012    Procedure: BYPASS GRAFT AXILLA-BIFEMORAL;  Surgeon: Angelia Mould, MD;  Location: [place];  Service: Vascular;  Laterality: Right;  Right Axillo to Right Femoral Bypass Graft  . Femoral-popliteal  bypass graft  06/18/2012    Procedure: BYPASS GRAFT FEMORAL-POPLITEAL ARTERY;  Surgeon: Angelia Mould, MD;  Location: [facility] OR;  Service: Vascular;  Laterality: Right;  Right Femoral to Above Knee Popliteal Bypass Graft  . Amputation  06/18/2012    Procedure: AMPUTATION DIGIT;  Surgeon: Angelia Mould, MD;  Location: [facility] OR;  Service: Vascular;  Laterality: Right;  Right side, amputation of fourth and fifth toes  . Av fistula placement  06/24/2012    Procedure: ARTERIOVENOUS (AV) FISTULA CREATION;  Surgeon: Angelia Mould, MD;  Location: [facility] OR;  Service: Vascular;  Laterality: Left;   Family History  Problem Relation Age of Onset  . Diabetes Mother   . Heart disease Mother   . Hypertension Mother   . Heart attack Mother   . Diabetes Father   . Heart disease Father   . Hypertension Father   . Heart attack Father   . Other Father     bleeding problems  . Diabetes Brother   . Heart disease Brother    History  Substance Use Topics  . Smoking status: Current Every Day Smoker -- 0.25 packs/day for 40 years    Types: Cigarettes    Last Attempt to Quit: 05/28/2012  . Smokeless tobacco: Never Used  . Alcohol Use: No     Comment: Previous drinker -  However, quit in 2006    Review of Systems  Constitutional: Negative for fever.  Respiratory: Negative for cough and shortness of breath.   Cardiovascular: Positive for chest pain.  Gastrointestinal: Negative for vomiting and abdominal pain.  Musculoskeletal: Negative for myalgias.  Neurological: Negative for weakness.      Allergies  Nicoderm; Gabapentin; and Vicodin  Home Medications   Prior to Admission medications   Medication Sig Start Date End Date Taking? Authorizing Provider  albuterol (PROAIR HFA) 108 (90 BASE) MCG/ACT inhaler Inhale 2 puffs into the lungs every 4 (four) hours as needed for wheezing or shortness of breath (Cough;). Please provided spacer 06/06/13   Olam Idler, MD  aspirin 81 MG chewable  tablet Chew 81 mg by mouth daily.    Historical Provider, MD  baclofen (LIORESAL) 10 MG tablet TAKE 1 TABLET BY MOUTH TWICE DAILY 12/02/13   [name], MD  benzonatate (TESSALON) 200 MG capsule Take 1 capsule (200 mg total) by mouth 3 (three) times daily as needed for cough. 06/06/13   Olam Idler, MD  betamethasone valerate ointment (VALISONE) 0.1 % Apply topically 2 (two) times daily. 12/21/12   Andrena Mews, MD  calcitRIOL (ROCALTROL) 0.25 MCG capsule Take 1 capsule (0.25 mcg total) by mouth daily. 07/03/12   Timmothy Euler, MD  calcium carbonate (TUMS - DOSED IN MG ELEMENTAL CALCIUM) 500 MG chewable tablet Chew 2 tablets (400 mg of elemental calcium total) by mouth 3 (three) times daily with meals. 07/03/12   Timmothy Euler, MD  cephALEXin (KEFLEX) 500 MG capsule Take 1 capsule (500 mg total) by mouth 3 (three) times daily. 01/10/13   [name], MD  cholecalciferol (VITAMIN D-400) 400 UNITS TABS Take 400 Units by mouth 2 (two) times daily.    Historical Provider, MD  collagenase (SANTYL) ointment Apply topically daily. 07/16/12   Princess Perna, NP  darbepoetin (ARANESP) 100 MCG/0.5ML SOLN Inject 0.5 mLs (100 mcg total) into the skin every Monday. 06/24/12   Timmothy Euler, MD  docusate sodium (COLACE) 250 MG capsule Take 1 capsule (250 mg total) by mouth 2 (two) times daily. 07/07/12   [name], MD  erythromycin ophthalmic ointment  07/20/13   Historical Provider, MD  furosemide (LASIX) 80 MG tablet fTake one hal tab or up to two tabs daily by mouth as directed by kidney doctor. 11/11/12   [name], MD  furosemide (LASIX) 80 MG tablet TAKE 2 TABLETS BY MOUTH THREE TIMES DAILY 08/17/13   [name], MD  insulin glargine (LANTUS) 100 UNIT/ML injection Inject 0.06 mLs (6 Units total) into the skin daily.    [name], MD  insulin lispro (HUMALOG) 100 UNIT/ML injection Inject 5 Units into the skin 2 (two) times daily before lunch and supper. Per sliding scale. 08/13/10   Zigmund Gottron, MD  losartan-hydrochlorothiazide [facility]) 100-12.5 MG per tablet  08/24/13   Historical Provider, MD  metoprolol succinate (TOPROL-XL) 25 MG 24 hr tablet TAKE 1 TABLET BY MOUTH EVERY DAY 01/13/14   [name], MD  nicotine (NICODERM CQ - DOSED IN MG/24 HR) 7 mg/24hr patch Place 1 patch onto the skin daily. 07/03/12   Jayce G Cook, DO  ondansetron (ZOFRAN) 4 MG tablet Take 1 tablet (4 mg total) by mouth every 8 (eight) hours as needed for nausea. 09/03/12   Marin Olp, MD  oseltamivir (TAMIFLU) 30 MG capsule Take 1 capsule (30 mg total) by mouth  daily. 06/06/13   Olam Idler, MD  oxyCODONE-acetaminophen (PERCOCET) 7.5-325 MG per tablet Take one by mouth four times a day as needed for pain do not fill before February 21 2014 12/28/13   [name], MD  pravastatin (PRAVACHOL) 40 MG tablet Take 1 tablet (40 mg total) by mouth daily. 03/10/12   Dayarmys Piloto de Gwendalyn Ege, MD  simvastatin (ZOCOR) 80 MG tablet  08/30/12   Historical Provider, MD  simvastatin (ZOCOR) 80 MG tablet TAKE 1 TABLET BY MOUTH AT BEDTIME 11/27/12   [name], MD  sodium bicarbonate 650 MG tablet Take 1 tablet (650 mg total) by mouth 2 (two) times daily. 06/24/12   Timmothy Euler, MD  sodium bicarbonate 650 MG tablet TAKE 2 TABLETS BY MOUTH TWICE DAILY 08/28/12   [name], MD  sodium polystyrene (KAYEXALATE) 15 GM/60ML suspension Take 60 mLs (15 g total) by mouth See admin instructions. Takes every 3-4 days 12/28/13   [name], MD  Spacer/Aero-Holding Chambers (BREATHERITE COLL SPACER ADULT) MISC 1 Device by Does not apply route once. 06/06/13   Olam Idler, MD  sulfamethoxazole-trimethoprim (BACTRIM DS) 800-160 MG per tablet Take 1 tablet by mouth 2 (two) times daily. 01/04/14   [name], MD  tamsulosin (FLOMAX) 0.4 MG CAPS capsule Take 0.4 mg by mouth daily. 04/27/13   [name], MD  triamcinolone cream (KENALOG) 0.1 % Apply topically 2 (two) times daily. Apply to affected areas as directed patient has  written instructions 01/17/13   [name], MD  varenicline (CHANTIX PAK) 0.5 MG X 11 & 1 MG X 42 tablet Take one 0.5 mg tablet by mouth once daily for 3 days, then increase to one 0.5 mg tablet twice daily for 4 days, then increase to one 1 mg tablet twice daily. 10/13/13   [name], MD  varenicline (CHANTIX) 1 MG tablet Take 1 tablet (1 mg total) by mouth 2 (two) times daily. 10/13/13   [name], MD  vitamin C (ASCORBIC ACID) 500 MG tablet Take 500 mg by mouth daily.    Historical Provider, MD   BP 138/73  Pulse 76  Resp 20  SpO2 95% Physical Exam  Constitutional: He is oriented to person, place, and time. He appears well-developed and well-nourished.  HENT:  Head: Normocephalic.  Neck: Normal range of motion. Neck supple.  Cardiovascular: Normal rate and regular rhythm.   Pulmonary/Chest: Effort normal and breath sounds normal. He has no wheezes. He has no rales. He exhibits no tenderness.  Abdominal: Soft. Bowel sounds are normal. There is no tenderness. There is no rebound and no guarding.  Musculoskeletal: Normal range of motion.  Positive thrill proximal and distal portions of dialysis graft in upper left arm. No hematoma palpable. No significant swelling.   Neurological: He is alert and oriented to person, place, and time.  Skin: Skin is warm and dry. No rash noted.  Psychiatric: He has a normal mood and affect.    ED Course  Procedures (including critical care time) Labs Review Labs Reviewed  COMPREHENSIVE METABOLIC PANEL - Abnormal; Notable for the following:    Glucose, Bld 174 (*)    BUN 72 (*)    Creatinine, Ser 6.96 (*)    GFR calc non Af Amer 8 (*)    GFR calc Af Amer 9 (*)    Anion gap 16 (*)    All other components within normal limits  CBC WITH DIFFERENTIAL -  Abnormal; Notable for the following:    RBC 2.93 (*)    Hemoglobin 8.8 (*)    HCT 28.5 (*)    Platelets 140 (*)    All other components within normal limits  Randolm Idol, ED    Imaging  Review Dg Chest 2 View  01/24/2014   CLINICAL DATA:  Chest pain.  Dialysis patient.  EXAM: CHEST  2 VIEW  COMPARISON:  06/28/2012.  FINDINGS: The heart is borderline enlarged but stable. There is mild tortuosity of the thoracic aorta. Chronic vascular congestion without overt pulmonary edema. No pleural effusions. No pneumothorax. The bony thorax is intact. Remote healed rib fractures are noted bilaterally.  IMPRESSION: Chronic vascular congestion but no overt pulmonary edema or pleural effusion.   Electronically Signed   By: Kalman Jewels M.D.   On: 01/24/2014 21:39     EKG Interpretation None      MDM   Final diagnoses:  None    1. Nonspecific chest pain  Labs normal, EKG non-acute, CXR stable - doubt ACS. Suspect patient is anxious surrounding use of fistula for the first dialysis session that occurred today. He appears well. No obvious occlusion of dialysis graft given positive thrill and no induration to indicate hematoma. Stable for discharge home. Will provide Ativan for symptomatic relief.     Dewaine Oats, PA-C 01/24/14 2224

## 2014-01-24 NOTE — Telephone Encounter (Signed)
[facility] After hours line  Received phone call from patient regarding pain he is experiencing after dialysis today. Patient had his first session today using an AV fistula. He says that after the session he developed arm pain that has not gone away and the arm is now swollen. It is not currently bleeding. I discussed with him that I would be unable to diagnose him over the phone, and if the pain and swelling is severe enough he should come to the ED to be evaluated. Patient stated he would go to the ED tonight and verbalized understanding.  Tawanna Sat, MD 01/24/2014, 7:28 PM PGY-2, [place]

## 2014-01-24 NOTE — Discharge Instructions (Signed)
Chest Pain (Nonspecific) °It is often hard to give a specific diagnosis for the cause of chest pain. There is always a chance that your pain could be related to something serious, such as a heart attack or a blood clot in the lungs. You need to follow up with your health care provider for further evaluation. °CAUSES  °· Heartburn. °· Pneumonia or bronchitis. °· Anxiety or stress. °· Inflammation around your heart (pericarditis) or lung (pleuritis or pleurisy). °· A blood clot in the lung. °· A collapsed lung (pneumothorax). It can develop suddenly on its own (spontaneous pneumothorax) or from trauma to the chest. °· Shingles infection (herpes zoster virus). °The chest wall is composed of bones, muscles, and cartilage. Any of these can be the source of the pain. °· The bones can be bruised by injury. °· The muscles or cartilage can be strained by coughing or overwork. °· The cartilage can be affected by inflammation and become sore (costochondritis). °DIAGNOSIS  °Lab tests or other studies may be needed to find the cause of your pain. Your health care provider may have you take a test called an ambulatory electrocardiogram (ECG). An ECG records your heartbeat patterns over a 24-hour period. You may also have other tests, such as: °· Transthoracic echocardiogram (TTE). During echocardiography, sound waves are used to evaluate how blood flows through your heart. °· Transesophageal echocardiogram (TEE). °· Cardiac monitoring. This allows your health care provider to monitor your heart rate and rhythm in real time. °· Holter monitor. This is a portable device that records your heartbeat and can help diagnose heart arrhythmias. It allows your health care provider to track your heart activity for several days, if needed. °· Stress tests by exercise or by giving medicine that makes the heart beat faster. °TREATMENT  °· Treatment depends on what may be causing your chest pain. Treatment may include: °¨ Acid blockers for  heartburn. °¨ Anti-inflammatory medicine. °¨ Pain medicine for inflammatory conditions. °¨ Antibiotics if an infection is present. °· You may be advised to change lifestyle habits. This includes stopping smoking and avoiding alcohol, caffeine, and chocolate. °· You may be advised to keep your head raised (elevated) when sleeping. This reduces the chance of acid going backward from your stomach into your esophagus. °Most of the time, nonspecific chest pain will improve within 2-3 days with rest and mild pain medicine.  °HOME CARE INSTRUCTIONS  °· If antibiotics were prescribed, take them as directed. Finish them even if you start to feel better. °· For the next few days, avoid physical activities that bring on chest pain. Continue physical activities as directed. °· Do not use any tobacco products, including cigarettes, chewing tobacco, or electronic cigarettes. °· Avoid drinking alcohol. °· Only take medicine as directed by your health care provider. °· Follow your health care provider's suggestions for further testing if your chest pain does not go away. °· Keep any follow-up appointments you made. If you do not go to an appointment, you could develop lasting (chronic) problems with pain. If there is any problem keeping an appointment, call to reschedule. °SEEK MEDICAL CARE IF:  °· Your chest pain does not go away, even after treatment. °· You have a rash with blisters on your chest. °· You have a fever. °SEEK IMMEDIATE MEDICAL CARE IF:  °· You have increased chest pain or pain that spreads to your arm, neck, jaw, back, or abdomen. °· You have shortness of breath. °· You have an increasing cough, or you cough   up blood. °· You have severe back or abdominal pain. °· You feel nauseous or vomit. °· You have severe weakness. °· You faint. °· You have chills. °This is an emergency. Do not wait to see if the pain will go away. Get medical help at once. Call your local emergency services (911 in [place] Do not drive  yourself to the hospital. °MAKE SURE YOU:  °· Understand these instructions. °· Will watch your condition. °· Will get help right away if you are not doing well or get worse. °Document Released: 03/05/2005 Document Revised: 05/31/2013 Document Reviewed: 12/30/2007 °ExitCare® Patient Information ©2015 [employer]. This information is not intended to replace advice given to you by your health care provider. Make sure you discuss any questions you have with your health care provider. °Generalized Anxiety Disorder °Generalized anxiety disorder (GAD) is a mental disorder. It interferes with life functions, including relationships, work, and school. °GAD is different from normal anxiety, which everyone experiences at some point in their lives in response to specific life events and activities. Normal anxiety actually helps us prepare for and get through these life events and activities. Normal anxiety goes away after the event or activity is over.  °GAD causes anxiety that is not necessarily related to specific events or activities. It also causes excess anxiety in proportion to specific events or activities. The anxiety associated with GAD is also difficult to control. GAD can vary from mild to severe. People with severe GAD can have intense waves of anxiety with physical symptoms (panic attacks).  °SYMPTOMS °The anxiety and worry associated with GAD are difficult to control. This anxiety and worry are related to many life events and activities and also occur more days than not for 6 months or longer. People with GAD also have three or more of the following symptoms (one or more in children): °· Restlessness.   °· Fatigue. °· Difficulty concentrating.   °· Irritability. °· Muscle tension. °· Difficulty sleeping or unsatisfying sleep. °DIAGNOSIS °GAD is diagnosed through an assessment by your health care provider. Your health care provider will ask you questions about your mood, physical symptoms, and events in your  life. Your health care provider may ask you about your medical history and use of alcohol or drugs, including prescription medicines. Your health care provider may also do a physical exam and blood tests. Certain medical conditions and the use of certain substances can cause symptoms similar to those associated with GAD. Your health care provider may refer you to a mental health specialist for further evaluation. °TREATMENT °The following therapies are usually used to treat GAD:  °· Medication. Antidepressant medication usually is prescribed for long-term daily control. Antianxiety medicines may be added in severe cases, especially when panic attacks occur.   °· Talk therapy (psychotherapy). Certain types of talk therapy can be helpful in treating GAD by providing support, education, and guidance. A form of talk therapy called cognitive behavioral therapy can teach you healthy ways to think about and react to daily life events and activities. °· Stress management techniques. These include yoga, meditation, and exercise and can be very helpful when they are practiced regularly. °A mental health specialist can help determine which treatment is best for you. Some people see improvement with one therapy. However, other people require a combination of therapies. °Document Released: 09/20/2012 Document Revised: 10/10/2013 Document Reviewed: 09/20/2012 °ExitCare® Patient Information ©2015 [employer]. This information is not intended to replace advice given to you by your health care provider. Make sure you discuss any questions you   have with your health care provider. ° °

## 2014-01-24 NOTE — ED Notes (Addendum)
Pt went to dialysis today for first time ever with normal arm. Finished 2 hours and arm was "jumping during it" MD and RN were looking at it as his arm was having spasms. At 1200 today arm began to swell. Bruit and thrill present. PT states tightness in chest that started right when I asked him if he was short of breath or had chest pain.

## 2014-01-25 NOTE — ED Provider Notes (Signed)
Medical screening examination/treatment/procedure(s) were performed by non-physician practitioner and as supervising physician I was immediately available for consultation/collaboration.   EKG Interpretation None        Wandra Arthurs, MD 01/25/14 1049

## 2014-01-30 ENCOUNTER — Encounter: Payer: Self-pay | Admitting: Neurology

## 2014-01-30 ENCOUNTER — Ambulatory Visit (INDEPENDENT_AMBULATORY_CARE_PROVIDER_SITE_OTHER): Payer: Medicare Other | Admitting: Neurology

## 2014-01-30 VITALS — BP 132/65 | HR 75 | Temp 98.4°F | Ht 70.0 in | Wt 167.0 lb

## 2014-01-30 DIAGNOSIS — R0689 Other abnormalities of breathing: Secondary | ICD-10-CM

## 2014-01-30 DIAGNOSIS — Z8669 Personal history of other diseases of the nervous system and sense organs: Secondary | ICD-10-CM

## 2014-01-30 DIAGNOSIS — N186 End stage renal disease: Secondary | ICD-10-CM

## 2014-01-30 DIAGNOSIS — G4710 Hypersomnia, unspecified: Secondary | ICD-10-CM

## 2014-01-30 DIAGNOSIS — R400 Somnolence: Secondary | ICD-10-CM

## 2014-01-30 DIAGNOSIS — F40240 Claustrophobia: Secondary | ICD-10-CM

## 2014-01-30 DIAGNOSIS — R0989 Other specified symptoms and signs involving the circulatory and respiratory systems: Secondary | ICD-10-CM

## 2014-01-30 DIAGNOSIS — F40298 Other specified phobia: Secondary | ICD-10-CM

## 2014-01-30 DIAGNOSIS — R0609 Other forms of dyspnea: Secondary | ICD-10-CM

## 2014-01-30 DIAGNOSIS — Z87828 Personal history of other (healed) physical injury and trauma: Secondary | ICD-10-CM

## 2014-01-30 DIAGNOSIS — G253 Myoclonus: Secondary | ICD-10-CM

## 2014-01-30 MED ORDER — ALPRAZOLAM 0.5 MG PO TABS: ORAL_TABLET | ORAL | Status: DC

## 2014-01-30 NOTE — Patient Instructions (Addendum)
We will do some blood work, a sleep study, will do an MRI of your neck, an EEG and will call you with all the results. Your muscle jerking may be due to chronic kidney disease.

## 2014-01-30 NOTE — Progress Notes (Signed)
Subjective:    Patient ID: John [name] is a 58 y.o. male.  HPI    Star Age, MD, PhD [facility] [address] [place], [place] [address]  Dear Dr. Jimmy Footman,   I saw your patient, John [name], upon your kind request in my neurologic clinic today for initial consultation of his abnormal involuntary movements. The patient is accompanied by his wife today. As you know, John [name] is a 58 year old right-handed gentleman with an underlying medical history of hyperlipidemia, COPD, neck degenerative disc disease, anemia secondary to chronic renal failure, chronic renal disease stage IV, now on hemodialysis, diabetes type 2, hypertension, peripheral vascular disease, secondary hyperparathyroidism, who reports abnormal involuntary twitching in his upper extremities of approximately 1-2 week duration. He quit smoking some 2 weeks ago. He does not drink alcohol.  He had N/V and diarrhea about a week before his twitching started. He describes, jerking in his UEs and LEs, independently, not rhythmic and no pattern, no LOC or clouding of mentation, and it also happened in his sleep.  He has recently been started on HD and has had 3 sessions thus far. His jerking spontaneously gradually subsided 2 days ago after his 3rd session of HD. He feels at baseline. He has no prior Hx of jerking or other involuntary movements, no Sz Hx no head trauma, but had neck fusion in 2006, which helped. He has injury to the neck and had neck injury, but feels at baseline in that regard. He has a prosthetic eye since childhood.  He had an MRI neck 04/03/08: Mild cord atrophy from C4-C6. No syrinx. Previous ACDF from C3-C5. I cannot be sure that there is solid union at the C4-5 level. Degenerative spondylosis at C5-6 and C6-7 with osteophytes and protruding disc material that efface the ventral subarachnoid space but do not grossly compress the cord. Neural foraminal encroachment  bilaterally at those levels that could effect either C6 were C7 nerve root.  Of note, he has had episodes of gasping for air in sleep. He and his wife do not sleep in the same bed at this time. He has woken up with a sense of choking.  His Past Medical History Is Significant For: Past Medical History  Diagnosis Date  . COPD (chronic obstructive pulmonary disease)   . Chronic back pain     Chronic narcotics for this for many years  . Diabetes mellitus   . Hyperlipidemia   . Hypertension   . Foot pain   . Peripheral vascular disease   . Asthma   . Stroke     No residual "Mini strokes"  . Ulcer 1985  . Fracture of foot     [place]- Right  . Neuromuscular disorder     periferal neuropathy  . Chronic kidney disease     CKD stage III    His Past Surgical History Is Significant For: Past Surgical History  Procedure Laterality Date  . Neck surgery    . Eye surgery    . Cardiac catheterization    . Endarterectomy  12/18/2011    Procedure: ENDARTERECTOMY CAROTID;  Surgeon: Angelia Mould, MD;  Location: [facility];  Service: Vascular;  Laterality: Left;  . Carotid endarterectomy    . Axillary-femoral bypass graft  06/18/2012    Procedure: BYPASS GRAFT AXILLA-BIFEMORAL;  Surgeon: Angelia Mould, MD;  Location: [facility];  Service: Vascular;  Laterality: Right;  Right Axillo to Right Femoral Bypass Graft  . Femoral-popliteal bypass  graft  06/18/2012    Procedure: BYPASS GRAFT FEMORAL-POPLITEAL ARTERY;  Surgeon: Angelia Mould, MD;  Location: [facility] OR;  Service: Vascular;  Laterality: Right;  Right Femoral to Above Knee Popliteal Bypass Graft  . Amputation  06/18/2012    Procedure: AMPUTATION DIGIT;  Surgeon: Angelia Mould, MD;  Location: [facility] OR;  Service: Vascular;  Laterality: Right;  Right side, amputation of fourth and fifth toes  . Av fistula placement  06/24/2012    Procedure: ARTERIOVENOUS (AV) FISTULA CREATION;  Surgeon: Angelia Mould, MD;  Location: [place];   Service: Vascular;  Laterality: Left;    His Family History Is Significant For: Family History  Problem Relation Age of Onset  . Diabetes Mother   . Heart disease Mother   . Hypertension Mother   . Heart attack Mother   . Diabetes Father   . Heart disease Father   . Hypertension Father   . Heart attack Father   . Other Father     bleeding problems  . Diabetes Brother   . Heart disease Brother     His Social History Is Significant For: History   Social History  . Marital Status: Married    Spouse Name: John [name]    Number of Children: 4  . Years of Education: 11   Occupational History  . 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 Smoking status: Former Smoker -- 0.25 packs/day for 40 years    Types: Cigarettes    Quit date: 01/30/2014  . Smokeless tobacco: Never Used  . Alcohol Use: No     Comment: Previous drinker - However, quit in 2006  . Drug Use: No  . Sexual Activity: No   Other Topics Concern  . None   Social History Narrative   Health Care POA:    Emergency Contact: wife, John [name] [phone]   End of Life Plan: gave patient AD pamphlet.   Who lives with you: wife   Any pets: 1 dog   Diet: Pt has a varied diet of protein, starch and vegetables.   Exercise: Pt reports walking around yard.    Seatbelts: Pt reports wearing seatbelt when in vehicles.    Hobbies: fishing                His Allergies Are:  Allergies  Allergen Reactions  . Nicoderm [Nicotine] Itching and Rash  . Gabapentin Other (See Comments)    Can't walk, shuts legs down  . Vicodin [Hydrocodone-Acetaminophen] Hives and Itching  :   His Current Medications Are:  Outpatient Encounter Prescriptions as of 01/30/2014  Medication Sig  . albuterol (PROAIR HFA) 108 (90 BASE) MCG/ACT inhaler Inhale 2 puffs into the lungs every 4 (four) hours as needed for wheezing or shortness of breath (Cough;). Please provided spacer  . aspirin 81 MG chewable tablet Chew 81 mg by mouth  daily.  . baclofen (LIORESAL) 10 MG tablet TAKE 1 TABLET BY MOUTH TWICE DAILY  . benzonatate (TESSALON) 200 MG capsule Take 1 capsule (200 mg total) by mouth 3 (three) times daily as needed for cough.  . betamethasone valerate ointment (VALISONE) 0.1 % Apply topically 2 (two) times daily.  . cephALEXin (KEFLEX) 500 MG capsule Take 1 capsule (500 mg total) by mouth 3 (three) times daily.  . darbepoetin (ARANESP) 100 MCG/0.5ML SOLN Inject 0.5 mLs (100 mcg total) into the skin every Monday.  . docusate sodium (COLACE) 250 MG capsule Take 1 capsule (250 mg total) by mouth  2 (two) times daily.  Marland Kitchen erythromycin ophthalmic ointment   . insulin glargine (LANTUS) 100 UNIT/ML injection Inject 0.06 mLs (6 Units total) into the skin daily.  . insulin lispro (HUMALOG) 100 UNIT/ML injection Inject 5 Units into the skin 2 (two) times daily before lunch and supper. Per sliding scale.  Marland Kitchen LORazepam (ATIVAN) 0.5 MG tablet Take 2 tablets (1 mg total) by mouth 3 (three) times daily as needed for anxiety.  Marland Kitchen losartan-hydrochlorothiazide (HYZAAR) 100-12.5 MG per tablet   . metoprolol succinate (TOPROL-XL) 25 MG 24 hr tablet TAKE 1 TABLET BY MOUTH EVERY DAY  . ondansetron (ZOFRAN) 4 MG tablet Take 1 tablet (4 mg total) by mouth every 8 (eight) hours as needed for nausea.  Marland Kitchen oseltamivir (TAMIFLU) 30 MG capsule Take 1 capsule (30 mg total) by mouth daily.  Marland Kitchen oxyCODONE-acetaminophen (PERCOCET) 7.5-325 MG per tablet Take one by mouth four times a day as needed for pain do not fill before February 21 2014  . sodium polystyrene (KAYEXALATE) 15 GM/60ML suspension Take 60 mLs (15 g total) by mouth See admin instructions. Takes every 3-4 days  . Spacer/Aero-Holding Chambers (BREATHERITE COLL SPACER ADULT) MISC 1 Device by Does not apply route once.  . sulfamethoxazole-trimethoprim (BACTRIM DS) 800-160 MG per tablet Take 1 tablet by mouth 2 (two) times daily.  . tamsulosin (FLOMAX) 0.4 MG CAPS capsule Take 0.4 mg by mouth daily.   . varenicline (CHANTIX) 1 MG tablet Take 1 tablet (1 mg total) by mouth 2 (two) times daily.  . vitamin C (ASCORBIC ACID) 500 MG tablet Take 500 mg by mouth daily.  . [DISCONTINUED] calcitRIOL (ROCALTROL) 0.25 MCG capsule Take 1 capsule (0.25 mcg total) by mouth daily.  . [DISCONTINUED] calcium carbonate (TUMS - DOSED IN MG ELEMENTAL CALCIUM) 500 MG chewable tablet Chew 2 tablets (400 mg of elemental calcium total) by mouth 3 (three) times daily with meals.  . [DISCONTINUED] cholecalciferol (VITAMIN D-400) 400 UNITS TABS Take 400 Units by mouth 2 (two) times daily.  . [DISCONTINUED] collagenase (SANTYL) ointment Apply topically daily.  . [DISCONTINUED] furosemide (LASIX) 80 MG tablet fTake one hal tab or up to two tabs daily by mouth as directed by kidney doctor.  . [DISCONTINUED] furosemide (LASIX) 80 MG tablet TAKE 2 TABLETS BY MOUTH THREE TIMES DAILY  . [DISCONTINUED] nicotine (NICODERM CQ - DOSED IN MG/24 HR) 7 mg/24hr patch Place 1 patch onto the skin daily.  . [DISCONTINUED] pravastatin (PRAVACHOL) 40 MG tablet Take 1 tablet (40 mg total) by mouth daily.  . [DISCONTINUED] simvastatin (ZOCOR) 80 MG tablet   . [DISCONTINUED] simvastatin (ZOCOR) 80 MG tablet TAKE 1 TABLET BY MOUTH AT BEDTIME  . [DISCONTINUED] sodium bicarbonate 650 MG tablet Take 1 tablet (650 mg total) by mouth 2 (two) times daily.  . [DISCONTINUED] sodium bicarbonate 650 MG tablet TAKE 2 TABLETS BY MOUTH TWICE DAILY  . [DISCONTINUED] triamcinolone cream (KENALOG) 0.1 % Apply topically 2 (two) times daily. Apply to affected areas as directed patient has written instructions  . [DISCONTINUED] varenicline (CHANTIX PAK) 0.5 MG X 11 & 1 MG X 42 tablet Take one 0.5 mg tablet by mouth once daily for 3 days, then increase to one 0.5 mg tablet twice daily for 4 days, then increase to one 1 mg tablet twice daily.  :   Review of Systems:  Out of a complete 14 point review of systems, all are reviewed and negative with the exception  of these symptoms as listed below:  Review of Systems  Constitutional: Positive for fever and fatigue.       Weight loss  Respiratory: Positive for cough.   Cardiovascular: Positive for leg swelling.  Gastrointestinal: Positive for diarrhea.  Endocrine:       Feeling cold    Objective:  Neurologic Exam  Physical Exam Physical Examination:   Filed Vitals:   01/30/14 0913  BP: 132/65  Pulse: 75  Temp: 98.4 F (36.9 C)   General Examination: The patient is a very pleasant 58 y.o. male in no acute distress. He appears well-developed and well-nourished and adequately groomed.   HEENT: Normocephalic, atraumatic, pupil on the R is reactive to light and accommodation. He has a prosthetic L eye. Funduscopic exam is normal on the R with sharp disc margins noted. Extraocular tracking is good without limitation to gaze excursion or nystagmus noted. Normal smooth pursuit is noted. Hearing is grossly intact. Tympanic membranes are not visualized secondary to cerumen impaction bilaterally. Face is symmetric with normal facial animation and normal facial sensation. Speech is clear with no dysarthria noted. There is no hypophonia. There is no lip, neck/head, jaw or voice tremor. Neck is supple with full range of passive and active motion. There are no carotid bruits on auscultation. Oropharynx exam reveals: moderate mouth dryness, adequate dental hygiene with several missing teeth and dentures on top and moderate airway crowding, due to large tongue, tonsils in place. Mallampati is class II. Tongue protrudes centrally and palate elevates symmetrically. Tonsils are 1+ in size. Neck size is 15.5 inches.  Chest: Clear to auscultation without wheezing, rhonchi or crackles noted.  Heart: S1+S2+0, regular and normal without murmurs, rubs or gallops noted.   Abdomen: Soft, non-tender and non-distended with normal bowel sounds appreciated on auscultation.  Extremities: There is trace pitting edema in the  distal lower extremities bilaterally. Pedal pulses are intact.  Skin: Warm and dry without trophic changes noted. There are no varicose veins. He has a AV fistula in the L arm.   Musculoskeletal: exam reveals no obvious joint deformities, tenderness or joint swelling or erythema.   Neurologically:  Mental status: The patient is awake, alert and oriented in all 4 spheres. His immediate and remote memory, attention, language skills and fund of knowledge are appropriate. There is no evidence of aphasia, agnosia, apraxia or anomia. Speech is clear with normal prosody and enunciation. Thought process is linear. Mood is normal and affect is normal.  Cranial nerves II - XII are as described above under HEENT exam. In addition: shoulder shrug is normal with equal shoulder height noted. Motor exam: thin bulk, fairly normal strength and tone is noted. There is no drift, tremor or rebound. Romberg is negative. Reflexes are 2+ throughout. Babinski: Toes are flexor bilaterally. Fine motor skills and coordination: intact with normal finger taps, normal hand movements, normal rapid alternating patting, normal foot taps and normal foot agility.  Cerebellar testing: No dysmetria or intention tremor on finger to nose testing. Heel to shin is unremarkable bilaterally. There is no truncal or gait ataxia. No myoclonus, athetosis or dystonia noted.  Sensory exam: intact to light touch, pinprick, vibration, temperature sense in the upper and lower extremities.  Gait, station and balance: He stands with difficulty. No veering to one side is noted. No leaning to one side is noted. Posture is a little stooped and he walks with a 4 prong cane. He has a slight limp, not new per patient and wife.  Assessment and Plan:   In summary, John [name] is a very pleasant 58 y.o.-year old male with an underlying medical history of hyperlipidemia, COPD, neck degenerative disc disease, anemia secondary to chronic renal  failure, chronic renal disease stage IV, now on hemodialysis, diabetes type 2, hypertension, peripheral vascular disease, secondary hyperparathyroidism, who reports abnormal involuntary twitching in his upper extremities of approximately 1-2 week duration, now subsided for the past 2 or 3 days since his last hemodialysis session. 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 I explained this to his wife and the patient. His physical exam does not suggest any abnormal involuntary movements. He has a history of traumatic syrinx as I understand and had surgery to his neck in 2006 under Dr. Saintclair Halsted in neurosurgery. At this juncture, I would like to do some additional blood work to make sure we're not dealing with any other treatable cause for myoclonus or involuntary movements. I would like to do a MRI neck, EEG and sleep study as well as his history suggests obstructive sleep apnea and he has a tighter looking airway. Sometimes medications can cause involuntary movements including myoclonus. He is on a narcotic pain medication as needed but has been on it for years. Hypoxemia and sleep can also cause involuntary twitching and since he's describing twitching that woke him up or happened in his sleep the sleep study will help as well. I did not suggest any new medications. He does indicate that he is quite claustrophobic. I prescribed some Xanax for the MRI. He did indicate that he was recently not able to go through with an MRI despite sedation. He's willing to give it a try. I will see him back after these tests are done.  I answered all their questions today and the patient and his wife were in agreement with the above outlined plan. Thank you very much for allowing me to participate in the care of this nice patient. If I can be of any further assistance to you please do not hesitate to call me at [phone].  Sincerely,   Star Age, MD, PhD

## 2014-01-31 ENCOUNTER — Telehealth: Payer: Self-pay | Admitting: Home Health Services

## 2014-01-31 NOTE — Telephone Encounter (Signed)
Contacted member to schedule diabetic eye exam.  Per patient he will be seeing Dr Gershon Crane in the next couple of weeks.

## 2014-02-01 ENCOUNTER — Ambulatory Visit (INDEPENDENT_AMBULATORY_CARE_PROVIDER_SITE_OTHER): Payer: Medicare Other | Admitting: Radiology

## 2014-02-01 DIAGNOSIS — R400 Somnolence: Secondary | ICD-10-CM

## 2014-02-01 DIAGNOSIS — Z87828 Personal history of other (healed) physical injury and trauma: Secondary | ICD-10-CM

## 2014-02-01 DIAGNOSIS — R0689 Other abnormalities of breathing: Secondary | ICD-10-CM

## 2014-02-01 DIAGNOSIS — G253 Myoclonus: Secondary | ICD-10-CM

## 2014-02-01 NOTE — Progress Notes (Signed)
Quick Note:  Please call and advise the patient that the EEG or brain wave test we performed was reported as normal in the awake and drowsy states. We checked for abnormal electrical discharges in the brain waves and the report suggested normal findings. No further action is required on this test at this time. Please remind patient to keep any upcoming appointments or tests and to call us with any interim questions, concerns, problems or updates. Thanks,  Osborne Serio, MD, PhD    ______ 

## 2014-02-01 NOTE — Procedures (Signed)
    History:  Chen Saadeh is a 58 year old gentleman with a history of episodes of myoclonic type jerking. The patient has a history of chronic renal insufficiency, now on hemodialysis. The patient has diabetes, hypertension, peripheral vascular disease, and COPD. The patient is being evaluated for possible seizures.  This is a routine EEG. No skull defects are noted. Medications include albuterol, alprazolam, aspirin, baclofen, Aranesp, insulin, losartan, hydrochlorothiazide, metoprolol, Zofran, oxycodone, Flomax, Chantix, vitamin C, and Spiriva.   EEG classification: Normal awake and drowsy  Description of the recording: The background rhythms of this recording consists of a fairly well modulated medium amplitude alpha rhythm of 9 Hz that is reactive to eye opening and closure. As the record progresses, the patient appears to remain in the waking state throughout the recording. Photic stimulation was performed, resulting in a bilateral and symmetric photic driving response. Hyperventilation was not performed. Toward the end of the recording, the patient enters the drowsy state with slight symmetric slowing seen. The patient appears to drift in and out of a drowsy state quite frequently throughout the recording. The patient never enters stage II sleep. At no time during the recording does there appear to be evidence of spike or spike wave discharges or evidence of focal slowing. EKG monitor shows no evidence of cardiac rhythm abnormalities with a heart rate of 78.  Impression: This is a normal EEG recording in the waking and drowsy state. No evidence of ictal or interictal discharges are seen.

## 2014-02-02 LAB — COPPER, SERUM: COPPER: 76 ug/dL (ref 72–166)

## 2014-02-02 LAB — COMPREHENSIVE METABOLIC PANEL
A/G RATIO: 2.2 (ref 1.1–2.5)
ALT: 16 IU/L (ref 0–44)
AST: 23 IU/L (ref 0–40)
Albumin: 4.3 g/dL (ref 3.5–5.5)
Alkaline Phosphatase: 104 IU/L (ref 39–117)
BILIRUBIN TOTAL: 0.4 mg/dL (ref 0.0–1.2)
BUN/Creatinine Ratio: 7 — ABNORMAL LOW (ref 9–20)
BUN: 39 mg/dL — ABNORMAL HIGH (ref 6–24)
CO2: 25 mmol/L (ref 18–29)
CREATININE: 5.61 mg/dL — AB (ref 0.76–1.27)
Calcium: 8.9 mg/dL (ref 8.7–10.2)
Chloride: 102 mmol/L (ref 97–108)
GFR calc non Af Amer: 10 mL/min/{1.73_m2} — ABNORMAL LOW (ref >59)
GFR, EST AFRICAN AMERICAN: 12 mL/min/{1.73_m2} — AB (ref >59)
GLOBULIN, TOTAL: 2 g/dL (ref 1.5–4.5)
GLUCOSE: 84 mg/dL (ref 65–99)
POTASSIUM: 5.1 mmol/L (ref 3.5–5.2)
SODIUM: 145 mmol/L — AB (ref 134–144)
TOTAL PROTEIN: 6.3 g/dL (ref 6.0–8.5)

## 2014-02-02 LAB — B12 AND FOLATE PANEL
Folate: >19.9 ng/mL (ref >3.0)
Vitamin B-12: 669 pg/mL (ref 211–946)

## 2014-02-02 LAB — RPR: RPR: NONREACTIVE

## 2014-02-02 LAB — ANA W/REFLEX: Anti Nuclear Antibody(ANA): NEGATIVE

## 2014-02-02 LAB — VITAMIN B6: Vitamin B6: 50.4 ug/L — ABNORMAL HIGH (ref 5.3–46.7)

## 2014-02-02 LAB — AMMONIA: Ammonia: 117 ug/dL — ABNORMAL HIGH (ref 27–102)

## 2014-02-02 LAB — TSH: TSH: 1.46 u[IU]/mL (ref 0.450–4.500)

## 2014-02-03 NOTE — Progress Notes (Signed)
Quick Note:  Please call patient back regarding his blood work. There are some expected changes in his blood work consistent with his chronic kidney impairment. In addition his ammonia level was borderline elevated which in isolation is not an alarming finding. This can be repeated down the road to make sure it stays in this range and does not increase. His vitamin B6 is slightly elevated. If he is taking a vitamin B6 supplement he may not need to take additional vitamin B6. He should run this by his kidney doctor. Otherwise, vitamin B12, thyroid function, autoimmune anti-bodies and copper level were normal. No further action is required on this test at this time.  Star Age, MD, PhD [facility]  ______

## 2014-02-07 NOTE — Progress Notes (Signed)
Quick Note:  Shared labs with patient per Dr Rexene Alberts, he verbalized understanding ______

## 2014-02-08 ENCOUNTER — Inpatient Hospital Stay: Admission: RE | Admit: 2014-02-08 | Payer: Medicare Other | Source: Ambulatory Visit

## 2014-02-19 ENCOUNTER — Ambulatory Visit
Admission: RE | Admit: 2014-02-19 | Discharge: 2014-02-19 | Disposition: A | Discharge status: Home / Self Care | Payer: Medicare Other | Source: Ambulatory Visit | Attending: Neurology | Admitting: Neurology

## 2014-02-19 DIAGNOSIS — G253 Myoclonus: Secondary | ICD-10-CM

## 2014-02-19 DIAGNOSIS — R0689 Other abnormalities of breathing: Secondary | ICD-10-CM

## 2014-02-19 DIAGNOSIS — R400 Somnolence: Secondary | ICD-10-CM

## 2014-02-19 DIAGNOSIS — M542 Cervicalgia: Secondary | ICD-10-CM

## 2014-02-19 DIAGNOSIS — Z87828 Personal history of other (healed) physical injury and trauma: Secondary | ICD-10-CM

## 2014-02-21 NOTE — Progress Notes (Signed)
Quick Note:  Shared MRI Cervical spine with patient and he verbalized understanding ______

## 2014-02-21 NOTE — Progress Notes (Signed)
Quick Note:  Please call patient and advise him that his recent MRI of the neck showed stable postoperative changes from his surgery and his neck at level CIII-C5 and mild degenerative changes at levels C5-6 and C6-7 but no serious findings and overall no significant change noted compared to an MRI dated 04/28/2012. No further action is required on this test at this time. Star Age, MD, PhD [facility]  ______

## 2014-02-27 ENCOUNTER — Telehealth: Payer: Self-pay | Admitting: Family Medicine

## 2014-02-27 NOTE — Telephone Encounter (Signed)
Has an appt oct 7 but his wife said to talk to John [name] and maybe she could get him in sooner

## 2014-02-28 NOTE — Telephone Encounter (Signed)
Appointment made with Dr. Nori Riis 03/08/2014 @ 9:30am

## 2014-02-28 NOTE — Telephone Encounter (Signed)
LVM for patient to call back. ?

## 2014-03-01 ENCOUNTER — Telehealth: Payer: Self-pay | Admitting: Family Medicine

## 2014-03-01 NOTE — Telephone Encounter (Signed)
[facility] pharmacy called and wanted to let the doctor know that they faxed over a refill request for a new glucometer and test strips and lancets. They can also take a verbal at [phone]. jw

## 2014-03-07 ENCOUNTER — Encounter: Payer: Self-pay | Admitting: Family

## 2014-03-08 ENCOUNTER — Encounter: Payer: Self-pay | Admitting: Family Medicine

## 2014-03-08 ENCOUNTER — Ambulatory Visit (INDEPENDENT_AMBULATORY_CARE_PROVIDER_SITE_OTHER)
Admission: RE | Admit: 2014-03-08 | Discharge: 2014-03-08 | Disposition: A | Discharge status: Home / Self Care | Payer: Medicare Other | Source: Ambulatory Visit | Attending: Family | Admitting: Family

## 2014-03-08 ENCOUNTER — Encounter: Payer: Self-pay | Admitting: Family

## 2014-03-08 ENCOUNTER — Other Ambulatory Visit: Payer: Self-pay | Admitting: Family

## 2014-03-08 ENCOUNTER — Ambulatory Visit (INDEPENDENT_AMBULATORY_CARE_PROVIDER_SITE_OTHER): Payer: Medicare Other | Admitting: Family

## 2014-03-08 ENCOUNTER — Ambulatory Visit (HOSPITAL_COMMUNITY)
Admission: RE | Admit: 2014-03-08 | Discharge: 2014-03-08 | Disposition: A | Discharge status: Home / Self Care | Payer: Medicare Other | Source: Ambulatory Visit | Attending: Family | Admitting: Family

## 2014-03-08 ENCOUNTER — Ambulatory Visit (INDEPENDENT_AMBULATORY_CARE_PROVIDER_SITE_OTHER): Payer: Medicare Other | Admitting: Family Medicine

## 2014-03-08 VITALS — BP 146/77 | HR 78 | Resp 16 | Ht 71.0 in | Wt 171.0 lb

## 2014-03-08 VITALS — BP 136/48 | HR 86 | Temp 98.3°F | Ht 70.0 in | Wt 170.2 lb

## 2014-03-08 DIAGNOSIS — Z48812 Encounter for surgical aftercare following surgery on the circulatory system: Secondary | ICD-10-CM

## 2014-03-08 DIAGNOSIS — I6529 Occlusion and stenosis of unspecified carotid artery: Secondary | ICD-10-CM

## 2014-03-08 DIAGNOSIS — F17200 Nicotine dependence, unspecified, uncomplicated: Secondary | ICD-10-CM | POA: Diagnosis not present

## 2014-03-08 DIAGNOSIS — I739 Peripheral vascular disease, unspecified: Secondary | ICD-10-CM | POA: Insufficient documentation

## 2014-03-08 DIAGNOSIS — I10 Essential (primary) hypertension: Secondary | ICD-10-CM | POA: Insufficient documentation

## 2014-03-08 DIAGNOSIS — E119 Type 2 diabetes mellitus without complications: Secondary | ICD-10-CM | POA: Insufficient documentation

## 2014-03-08 DIAGNOSIS — I70219 Atherosclerosis of native arteries of extremities with intermittent claudication, unspecified extremity: Secondary | ICD-10-CM

## 2014-03-08 DIAGNOSIS — M4712 Other spondylosis with myelopathy, cervical region: Secondary | ICD-10-CM

## 2014-03-08 DIAGNOSIS — G894 Chronic pain syndrome: Secondary | ICD-10-CM

## 2014-03-08 DIAGNOSIS — E785 Hyperlipidemia, unspecified: Secondary | ICD-10-CM | POA: Insufficient documentation

## 2014-03-08 DIAGNOSIS — G959 Disease of spinal cord, unspecified: Secondary | ICD-10-CM

## 2014-03-08 DIAGNOSIS — Z23 Encounter for immunization: Secondary | ICD-10-CM

## 2014-03-08 MED ORDER — OXYCODONE-ACETAMINOPHEN 7.5-325 MG PO TABS: ORAL_TABLET | ORAL | Status: DC

## 2014-03-08 NOTE — Patient Instructions (Signed)
Peripheral Vascular Disease Peripheral Vascular Disease (PVD), also called Peripheral Arterial Disease (PAD), is a circulation problem caused by cholesterol (atherosclerotic plaque) deposits in the arteries. PVD commonly occurs in the lower extremities (legs) but it can occur in other areas of the body, such as your arms. The cholesterol buildup in the arteries reduces blood flow which can cause pain and other serious problems. The presence of PVD can place a person at risk for Coronary Artery Disease (CAD).  CAUSES  Causes of PVD can be many. It is usually associated with more than one risk factor such as:   High Cholesterol.  Smoking.  Diabetes.  Lack of exercise or inactivity.  High blood pressure (hypertension).  Obesity.  Family history. SYMPTOMS   When the lower extremities are affected, patients with PVD may experience:  Leg pain with exertion or physical activity. This is called INTERMITTENT CLAUDICATION. This may present as cramping or numbness with physical activity. The location of the pain is associated with the level of blockage. For example, blockage at the abdominal level (distal abdominal aorta) may result in buttock or hip pain. Lower leg arterial blockage may result in calf pain.  As PVD becomes more severe, pain can develop with less physical activity.  In people with severe PVD, leg pain may occur at rest.  Other PVD signs and symptoms:  Leg numbness or weakness.  Coldness in the affected leg or foot, especially when compared to the other leg.  A change in leg color.  Patients with significant PVD are more prone to ulcers or sores on toes, feet or legs. These may take longer to heal or may reoccur. The ulcers or sores can become infected.  If signs and symptoms of PVD are ignored, gangrene may occur. This can result in the loss of toes or loss of an entire limb.  Not all leg pain is related to PVD. Other medical conditions can cause leg pain such  as:  Blood clots (embolism) or Deep Vein Thrombosis.  Inflammation of the blood vessels (vasculitis).  Spinal stenosis. DIAGNOSIS  Diagnosis of PVD can involve several different types of tests. These can include:  Pulse Volume Recording Method (PVR). This test is simple, painless and does not involve the use of X-rays. PVR involves measuring and comparing the blood pressure in the arms and legs. An ABI (Ankle-Brachial Index) is calculated. The normal ratio of blood pressures is 1. As this number becomes smaller, it indicates more severe disease.  < 0.95 - indicates significant narrowing in one or more leg vessels.  <0.8 - there will usually be pain in the foot, leg or buttock with exercise.  <0.4 - will usually have pain in the legs at rest.  <0.25 - usually indicates limb threatening PVD.  Doppler detection of pulses in the legs. This test is painless and checks to see if you have a pulses in your legs/feet.  A dye or contrast material (a substance that highlights the blood vessels so they show up on x-ray) may be given to help your caregiver better see the arteries for the following tests. The dye is eliminated from your body by the kidney's. Your caregiver may order blood work to check your kidney function and other laboratory values before the following tests are performed:  Magnetic Resonance Angiography (MRA). An MRA is a picture study of the blood vessels and arteries. The MRA machine uses a large magnet to produce images of the blood vessels.  Computed Tomography Angiography (CTA). A CTA   is a specialized x-ray that looks at how the blood flows in your blood vessels. An IV may be inserted into your arm so contrast dye can be injected.  Angiogram. Is a procedure that uses x-rays to look at your blood vessels. This procedure is minimally invasive, meaning a small incision (cut) is made in your groin. A small tube (catheter) is then inserted into the artery of your groin. The catheter  is guided to the blood vessel or artery your caregiver wants to examine. Contrast dye is injected into the catheter. X-rays are then taken of the blood vessel or artery. After the images are obtained, the catheter is taken out. TREATMENT  Treatment of PVD involves many interventions which may include:  Lifestyle changes:  Quitting smoking.  Exercise.  Following a low fat, low cholesterol diet.  Control of diabetes.  Foot care is very important to the PVD patient. Good foot care can help prevent infection.  Medication:  Cholesterol-lowering medicine.  Blood pressure medicine.  Anti-platelet drugs.  Certain medicines may reduce symptoms of Intermittent Claudication.  Interventional/Surgical options:  Angioplasty. An Angioplasty is a procedure that inflates a balloon in the blocked artery. This opens the blocked artery to improve blood flow.  Stent Implant. A wire mesh tube (stent) is placed in the artery. The stent expands and stays in place, allowing the artery to remain open.  Peripheral Bypass Surgery. This is a surgical procedure that reroutes the blood around a blocked artery to help improve blood flow. This type of procedure may be performed if Angioplasty or stent implants are not an option. SEEK IMMEDIATE MEDICAL CARE IF:   You develop pain or numbness in your arms or legs.  Your arm or leg turns cold, becomes blue in color.  You develop redness, warmth, swelling and pain in your arms or legs. MAKE SURE YOU:   Understand these instructions.  Will watch your condition.  Will get help right away if you are not doing well or get worse. Document Released: 07/03/2004 Document Revised: 08/18/2011 Document Reviewed: 05/30/2008 [employer] Patient Information 2015 [employer]. This information is not intended to replace advice given to you by your health care provider. Make sure you discuss any questions you have with your health care provider.   Smoking  Cessation Quitting smoking is important to your health and has many advantages. However, it is not always easy to quit since nicotine is a very addictive drug. Oftentimes, people try 3 times or more before being able to quit. This document explains the best ways for you to prepare to quit smoking. Quitting takes hard work and a lot of effort, but you can do it. ADVANTAGES OF QUITTING SMOKING  You will live longer, feel better, and live better.  Your body will feel the impact of quitting smoking almost immediately.  Within 20 minutes, blood pressure decreases. Your pulse returns to its normal level.  After 8 hours, carbon monoxide levels in the blood return to normal. Your oxygen level increases.  After 24 hours, the chance of having a heart attack starts to decrease. Your breath, hair, and body stop smelling like smoke.  After 48 hours, damaged nerve endings begin to recover. Your sense of taste and smell improve.  After 72 hours, the body is virtually free of nicotine. Your bronchial tubes relax and breathing becomes easier.  After 2 to 12 weeks, lungs can hold more air. Exercise becomes easier and circulation improves.  The risk of having a heart attack, stroke, cancer,   or lung disease is greatly reduced.  After 1 year, the risk of coronary heart disease is cut in half.  After 5 years, the risk of stroke falls to the same as a nonsmoker.  After 10 years, the risk of lung cancer is cut in half and the risk of other cancers decreases significantly.  After 15 years, the risk of coronary heart disease drops, usually to the level of a nonsmoker.  If you are pregnant, quitting smoking will improve your chances of having a healthy baby.  The people you live with, especially any children, will be healthier.  You will have extra money to spend on things other than cigarettes. QUESTIONS TO THINK ABOUT BEFORE ATTEMPTING TO QUIT You may want to talk about your answers with your health care  provider.  Why do you want to quit?  If you tried to quit in the past, what helped and what did not?  What will be the most difficult situations for you after you quit? How will you plan to handle them?  Who can help you through the tough times? Your family? Friends? A health care provider?  What pleasures do you get from smoking? What ways can you still get pleasure if you quit? Here are some questions to ask your health care provider:  How can you help me to be successful at quitting?  What medicine do you think would be best for me and how should I take it?  What should I do if I need more help?  What is smoking withdrawal like? How can I get information on withdrawal? GET READY  Set a quit date.  Change your environment by getting rid of all cigarettes, ashtrays, matches, and lighters in your home, car, or work. Do not let people smoke in your home.  Review your past attempts to quit. Think about what worked and what did not. GET SUPPORT AND ENCOURAGEMENT You have a better chance of being successful if you have help. You can get support in many ways.  Tell your family, friends, and coworkers that you are going to quit and need their support. Ask them not to smoke around you.  Get individual, group, or telephone counseling and support. Programs are available at local hospitals and health centers. Call your local health department for information about programs in your area.  Spiritual beliefs and practices may help some smokers quit.  Download a "quit meter" on your computer to keep track of quit statistics, such as how long you have gone without smoking, cigarettes not smoked, and money saved.  Get a self-help book about quitting smoking and staying off tobacco. LEARN NEW SKILLS AND BEHAVIORS  Distract yourself from urges to smoke. Talk to someone, go for a walk, or occupy your time with a task.  Change your normal routine. Take a different route to work. Drink tea  instead of coffee. Eat breakfast in a different place.  Reduce your stress. Take a hot bath, exercise, or read a book.  Plan something enjoyable to do every day. Reward yourself for not smoking.  Explore interactive web-based programs that specialize in helping you quit. GET MEDICINE AND USE IT CORRECTLY Medicines can help you stop smoking and decrease the urge to smoke. Combining medicine with the above behavioral methods and support can greatly increase your chances of successfully quitting smoking.  Nicotine replacement therapy helps deliver nicotine to your body without the negative effects and risks of smoking. Nicotine replacement therapy includes nicotine gum, lozenges, inhalers,   nasal sprays, and skin patches. Some may be available over-the-counter and others require a prescription.  Antidepressant medicine helps people abstain from smoking, but how this works is unknown. This medicine is available by prescription.  Nicotinic receptor partial agonist medicine simulates the effect of nicotine in your brain. This medicine is available by prescription. Ask your health care provider for advice about which medicines to use and how to use them based on your health history. Your health care provider will tell you what side effects to look out for if you choose to be on a medicine or therapy. Carefully read the information on the package. Do not use any other product containing nicotine while using a nicotine replacement product.  RELAPSE OR DIFFICULT SITUATIONS Most relapses occur within the first 3 months after quitting. Do not be discouraged if you start smoking again. Remember, most people try several times before finally quitting. You may have symptoms of withdrawal because your body is used to nicotine. You may crave cigarettes, be irritable, feel very hungry, cough often, get headaches, or have difficulty concentrating. The withdrawal symptoms are only temporary. They are strongest when you  first quit, but they will go away within 10-14 days. To reduce the chances of relapse, try to:  Avoid drinking alcohol. Drinking lowers your chances of successfully quitting.  Reduce the amount of caffeine you consume. Once you quit smoking, the amount of caffeine in your body increases and can give you symptoms, such as a rapid heartbeat, sweating, and anxiety.  Avoid smokers because they can make you want to smoke.  Do not let weight gain distract you. Many smokers will gain weight when they quit, usually less than 10 pounds. Eat a healthy diet and stay active. You can always lose the weight gained after you quit.  Find ways to improve your mood other than smoking. FOR MORE INFORMATION  www.smokefree.gov  Document Released: 05/20/2001 Document Revised: 10/10/2013 Document Reviewed: 09/04/2011 [employer] Patient Information 2015 [employer]. This information is not intended to replace advice given to you by your health care provider. Make sure you discuss any questions you have with your health care provider.  

## 2014-03-08 NOTE — Progress Notes (Signed)
[facility] [place] HISTORY AND PHYSICAL -PAD  History of Present Illness TREAVON [name] is a 58 y.o. male patient of Dr. Scot Dock who is s/p right axillofemoral bypass and right femoropopliteal bypass on 06/18/2012. He had right fourth and fifth toe amputations at that time. He is also s/p left CEA on 12/18/11, and left AV fistula creation on 06/24/2012.  He returns today for right LE arterial Duplex and ABI's.   After walking about 100 yards his calves hurt, unchanged from last visit, resolve with sitting, denies non healing wounds.  He states he has a "swollen spine", possibly spinal stenosis; he has chronic low back pain since falling in 2005.  Traumatic loss of left eye at age 89.   Patient has Negative history of TIA or stroke symptom. The patient denies amaurosis fugax or monocular blindness. The patient denies facial drooping.  Pt. denies hemiplegia. The patient denies receptive or expressive aphasia.  He started dialysis August, 2015, left upper arm AVF. He complains of severe pain in his entire left arm from the moment the AVF is accessed until the access device is removed; it appears that the same two small circumscribed areas have been accessed; he has tried local anesthetizing agents. Patient denies pain in his left arm otherwise.  Pt Diabetic: Yes, patient states in control.  Pt smoker: smokes 4-5 cigarettes/day, started in his teens   Past Medical History  Diagnosis Date  . COPD (chronic obstructive pulmonary disease)   . Chronic back pain     Chronic narcotics for this for many years  . Diabetes mellitus   . Hyperlipidemia   . Hypertension   . Foot pain   . Peripheral vascular disease   . Asthma   . Stroke     No residual "Mini strokes"  . Ulcer 1985  . Fracture of foot     [place]- Right  . Neuromuscular disorder     periferal neuropathy  . Chronic kidney disease     CKD stage III    Social History History  Substance Use Topics  . Smoking  status: Former Smoker -- 0.25 packs/day for 40 years    Types: Cigarettes    Quit date: 01/30/2014  . Smokeless tobacco: Never Used  . Alcohol Use: No     Comment: Previous drinker - However, quit in 2006    Family History Family History  Problem Relation Age of Onset  . Diabetes Mother   . Heart disease Mother   . Hypertension Mother   . Heart attack Mother   . Diabetes Father     Amputation  . Heart disease Father     Before age 65  . Hypertension Father   . Heart attack Father   . Other Father     bleeding problems  . Diabetes Brother   . Heart disease Brother     Before age 59  . Hypertension Brother   . Heart attack Brother     Past Surgical History  Procedure Laterality Date  . Neck surgery    . Eye surgery    . Cardiac catheterization    . Endarterectomy  12/18/2011    Procedure: ENDARTERECTOMY CAROTID;  Surgeon: Angelia Mould, MD;  Location: [place];  Service: Vascular;  Laterality: Left;  . Carotid endarterectomy    . Axillary-femoral bypass graft  06/18/2012    Procedure: BYPASS GRAFT AXILLA-BIFEMORAL;  Surgeon: Angelia Mould, MD;  Location: [place];  Service: Vascular;  Laterality: Right;  Right Axillo to Right Femoral Bypass Graft  . Femoral-popliteal bypass graft  06/18/2012    Procedure: BYPASS GRAFT FEMORAL-POPLITEAL ARTERY;  Surgeon: Angelia Mould, MD;  Location: [facility] OR;  Service: Vascular;  Laterality: Right;  Right Femoral to Above Knee Popliteal Bypass Graft  . Amputation  06/18/2012    Procedure: AMPUTATION DIGIT;  Surgeon: Angelia Mould, MD;  Location: [facility] OR;  Service: Vascular;  Laterality: Right;  Right side, amputation of fourth and fifth toes  . Av fistula placement  06/24/2012    Procedure: ARTERIOVENOUS (AV) FISTULA CREATION;  Surgeon: Angelia Mould, MD;  Location: [place];  Service: Vascular;  Laterality: Left;    Allergies  Allergen Reactions  . Nicoderm [Nicotine] Itching and Rash  . Gabapentin Other (See  Comments)    Can't walk, shuts legs down  . Vicodin [Hydrocodone-Acetaminophen] Hives and Itching    Current Outpatient Prescriptions  Medication Sig Dispense Refill  . albuterol (PROAIR HFA) 108 (90 BASE) MCG/ACT inhaler Inhale 2 puffs into the lungs every 4 (four) hours as needed for wheezing or shortness of breath (Cough;). Please provided spacer  8.5 g  2  . ALPRAZolam (XANAX) 0.5 MG tablet Take 1-2 pills on call to MRI for claustrophobia, may take a third pill if needed.  3 tablet  0  . aspirin 81 MG chewable tablet Chew 81 mg by mouth daily.      Marland Kitchen erythromycin ophthalmic ointment       . insulin glargine (LANTUS) 100 UNIT/ML injection Inject 0.06 mLs (6 Units total) into the skin daily.  30 mL  3  . insulin lispro (HUMALOG) 100 UNIT/ML injection Inject 5 Units into the skin 2 (two) times daily before lunch and supper. Per sliding scale.      . losartan-hydrochlorothiazide (HYZAAR) 100-12.5 MG per tablet       . oxyCODONE-acetaminophen (PERCOCET) 7.5-325 MG per tablet Take one by mouth four times a day as needed for pain  2015  120 tablet  0  . tamsulosin (FLOMAX) 0.4 MG CAPS capsule Take 0.4 mg by mouth daily.      . vitamin C (ASCORBIC ACID) 500 MG tablet Take 500 mg by mouth daily.      . baclofen (LIORESAL) 10 MG tablet TAKE 1 TABLET BY MOUTH TWICE DAILY  60 tablet  0  . benzonatate (TESSALON) 200 MG capsule Take 1 capsule (200 mg total) by mouth 3 (three) times daily as needed for cough.  20 capsule  0  . betamethasone valerate ointment (VALISONE) 0.1 % Apply topically 2 (two) times daily.  30 g  1  . cephALEXin (KEFLEX) 500 MG capsule Take 1 capsule (500 mg total) by mouth 3 (three) times daily.  30 capsule  0  . darbepoetin (ARANESP) 100 MCG/0.5ML SOLN Inject 0.5 mLs (100 mcg total) into the skin every Monday.  4.2 mL  0  . docusate sodium (COLACE) 250 MG capsule Take 1 capsule (250 mg total) by mouth 2 (two) times daily.  60 capsule  5  . LORazepam (ATIVAN) 0.5 MG tablet Take 2  tablets (1 mg total) by mouth 3 (three) times daily as needed for anxiety.  15 tablet  0  . metoprolol succinate (TOPROL-XL) 25 MG 24 hr tablet TAKE 1 TABLET BY MOUTH EVERY DAY  90 tablet  3  . ondansetron (ZOFRAN) 4 MG tablet Take 1 tablet (4 mg total) by mouth every 8 (eight) hours as needed for nausea.  20 tablet  0  .  oseltamivir (TAMIFLU) 30 MG capsule Take 1 capsule (30 mg total) by mouth daily.  5 capsule  0  . sodium polystyrene (KAYEXALATE) 15 GM/60ML suspension Take 60 mLs (15 g total) by mouth See admin instructions. Takes every 3-4 days  500 mL  0  . Spacer/Aero-Holding Chambers (BREATHERITE COLL SPACER ADULT) MISC 1 Device by Does not apply route once.  1 each  0  . sulfamethoxazole-trimethoprim (BACTRIM DS) 800-160 MG per tablet Take 1 tablet by mouth 2 (two) times daily.  20 tablet  1  . varenicline (CHANTIX) 1 MG tablet Take 1 tablet (1 mg total) by mouth 2 (two) times daily.  60 tablet  5  . [DISCONTINUED] tiotropium (SPIRIVA HANDIHALER) 18 MCG inhalation capsule Place 1 capsule (18 mcg total) into inhaler and inhale daily.  30 capsule  2   No current facility-administered medications for this visit.   Facility-Administered Medications Ordered in Other Visits  Medication Dose Route Frequency Provider Last Rate Last Dose  . ferumoxytol (FERAHEME) 1,020 mg in sodium chloride 0.9 % 100 mL IVPB  1,020 mg Intravenous Once Louis Meckel, MD        ROS: See HPI for pertinent positives and negatives.   Physical Examination  Filed Vitals:   03/08/14 1211  BP: 146/77  Pulse: 78  Resp: 16  Height: 5\' 11"  (1.803 m)  Weight: 171 lb (77.565 kg)  SpO2: 99%   Body mass index is 23.86 kg/(m^2).  General: WDWN in NAD  Gait: Normal  HENT: WNL  Eyes: Pupils equal  Pulmonary: normal non-labored breathing , without Rales, rhonchi, wheezing  Cardiac: RRR, no murmurs detected  Abdomen: soft, NT, no masses  Skin: no rashes, ulcers noted; no Gangrene , no cellulitis; no open  wounds.  VASCULAR EXAM  Carotid Bruits  Left  Right    Positive  Positive   VASCULAR EXAM:  Extremities without ischemic changes  without Gangrene; without open wounds.  Left AVF has a palpable thrill.  LE Pulses  LEFT  RIGHT   FEMORAL  palpable  palpable   POPLITEAL  not palpable  not palpable   POSTERIOR TIBIAL  not palpable  not palpable   DORSALIS PEDIS  ANTERIOR TIBIAL  not palpable  palpable    Musculoskeletal: no muscle wasting or atrophy; pretibial pitting edema: right; 1+, left: 2+. Right 4th and 5th toes surgically absent.  Neurologic: A&O X 3; Appropriate Affect ;  SENSATION: normal;  MOTOR FUNCTION: 5/5 Symmetric, CN 2-12 intact  Speech is fluent/normal   Non-Invasive Vascular Imaging: DATE: 03/08/2014 LOWER EXTREMITY ARTERIAL DUPLEX EVALUATION    INDICATION: Peripheral vascular disease     PREVIOUS INTERVENTION(S): Right axillary to femoral and femoral to popliteal artery bypass graft placed 06/2012    DUPLEX EXAM:     RIGHT  LEFT   Peak Systolic Velocity (cm/s) Ratio (if abnormal) Waveform  Peak Systolic Velocity (cm/s) Ratio (if abnormal) Waveform  101  T Inflow Artery     119  T Proximal Anastomosis     103  T Proximal Graft     69  T Mid Graft     97  T  Distal Graft     155  B Distal Anastomosis     133/114/93/ 87/77  B/B/ Hyperemic Outflow Artery     N-C/0.28 Today's ABI / TBI N-C/0.45  N-C/0.53 Previous ABI / TBI (09/07/2013  ) N-C/0.77    Waveform:    M - Monophasic       B -  Biphasic       T - Triphasic  If Ankle Brachial Index (ABI) or Toe Brachial Index (TBI) performed, please see complete report    IMPRESSION: Patent right femoral to popliteal artery bypass graft, no hemodynamically significant changes. Minimally elevated velocities at the distal anastomosis/distal superficial femoral artery outflow with heterogeneous plaque visualized of less than 50%. Dense calcific vessel walls present involving the calf arteries with known  non-compressibility.    Compared to the previous exam:  Decrease in toe brachial indices since previous study on 09/07/2013.    ASSESSMENT: John [name] is a 58 y.o. male who is s/p right axillofemoral bypass and right femoropopliteal bypass on 06/18/2012. He had right fourth and fifth toe amputations at that time. He is also s/p left CEA on 12/18/11, and left AV fistula creation on 06/24/2012. He has no history of stroke or TIA. Carotid Duplex on 09/07/2013 demonstrated minimal right ICA stenosis and a patent left CEA site. Today's right LE arterial Duplex demonstrates a patent right femoral to popliteal artery bypass graft, no hemodynamically significant changes. Minimally elevated velocities at the distal anastomosis/distal superficial femoral artery outflow with heterogeneous plaque visualized of less than 50%. Dense calcific vessel walls present involving the calf arteries with known non-compressibility. Decrease in toe brachial indices since previous study on 09/07/2013. After walking about 100 yards his calves hurt, unchanged from last visit, resolve with sitting, denies non healing wounds.   Unfortunately he continues to smoke and was counseled re smoking cessation.  PLAN:  I discussed in depth with the patient the nature of atherosclerosis, and emphasized the importance of maximal medical management including strict control of blood pressure, blood glucose, and lipid levels, obtaining regular exercise, and cessation of smoking.  The patient is aware that without maximal medical management the underlying atherosclerotic disease process will progress, limiting the benefit of any interventions. Dialysis technicians: please try to access different areas on the graft other than the two circumscribed areas that elicit severe pain during the several hours of dialysis sessions.  Local anesthetic has not helped.   Based on the patient's vascular studies and examination, and after Dr. Scot Dock  spoke with and examined patient,  pt will return to clinic in 6 months with ABI, right LE arterial Duplex.  The patient was given information about PAD including signs, symptoms, treatment, what symptoms should prompt the patient to seek immediate medical care, and risk reduction measures to take.  Clemon Chambers, RN, MSN, FNP-C [facility] Phone: [phone]  Clinic MD: Scot Dock  03/08/2014 12:36 PM

## 2014-03-10 ENCOUNTER — Encounter: Payer: Self-pay | Admitting: Family Medicine

## 2014-03-10 DIAGNOSIS — G8929 Other chronic pain: Secondary | ICD-10-CM | POA: Insufficient documentation

## 2014-03-10 NOTE — Assessment & Plan Note (Signed)
Refilled his chronic pain medicines today and he will followup in one month

## 2014-03-10 NOTE — Progress Notes (Signed)
   Subjective:    Patient ID: John [name], male    DOB: July 22, 1955, 59 y.o.   MRN: [id number]  HPI Followup myelopathy and lower extremity pain secondary to prior cervical stenosis status post ACDF. Continues on chronic pain therapy. She's had some increase in his lower strandy pain and he is also having some increased left upper showed he pain left upper extremity pain seems to be related to his new fistula site for his dialysis. He has increased sensitivity over the fistula and has pain access.   Review of Systems No fever, sweats, chills    Objective:   Physical Exam  Vital signs reviewed and GENERAL: Thin male no acute distress EXTREMITY: fistula site in his left arm reveals a thrill. Skin: 2 hyperpigmented areas over the access site. I see  no sign of induration or erythema. He does have hyperesthesia here.      Assessment & Plan:

## 2014-03-10 NOTE — Assessment & Plan Note (Signed)
I suspect his hyperesthesia over the vascular access site is related to his underlying cervical stenosis. Not sure there is anything different to do for this. He is going to see the neurologist today., Perhaps he can ask him if there is I nerve block they can do for this area so renal dialysis access is not as painful for him

## 2014-03-13 ENCOUNTER — Ambulatory Visit (INDEPENDENT_AMBULATORY_CARE_PROVIDER_SITE_OTHER): Payer: Medicare Other

## 2014-03-13 DIAGNOSIS — G253 Myoclonus: Secondary | ICD-10-CM

## 2014-03-13 DIAGNOSIS — G4710 Hypersomnia, unspecified: Secondary | ICD-10-CM

## 2014-03-13 DIAGNOSIS — R400 Somnolence: Secondary | ICD-10-CM

## 2014-03-13 DIAGNOSIS — R0902 Hypoxemia: Secondary | ICD-10-CM

## 2014-03-13 DIAGNOSIS — Z87828 Personal history of other (healed) physical injury and trauma: Secondary | ICD-10-CM

## 2014-03-13 DIAGNOSIS — G4733 Obstructive sleep apnea (adult) (pediatric): Secondary | ICD-10-CM

## 2014-03-13 DIAGNOSIS — R0689 Other abnormalities of breathing: Secondary | ICD-10-CM

## 2014-03-15 ENCOUNTER — Other Ambulatory Visit: Payer: Self-pay | Admitting: Family Medicine

## 2014-03-15 ENCOUNTER — Ambulatory Visit: Payer: Medicare Other | Admitting: Family Medicine

## 2014-03-20 ENCOUNTER — Ambulatory Visit (INDEPENDENT_AMBULATORY_CARE_PROVIDER_SITE_OTHER): Payer: Medicare Other | Admitting: Family Medicine

## 2014-03-20 ENCOUNTER — Encounter: Payer: Self-pay | Admitting: Family Medicine

## 2014-03-20 VITALS — BP 146/74 | HR 85 | Temp 98.0°F | Wt 175.4 lb

## 2014-03-20 DIAGNOSIS — M79622 Pain in left upper arm: Secondary | ICD-10-CM

## 2014-03-20 MED ORDER — LIDOCAINE-EPINEPHRINE-TETRACAINE (LET) TOPICAL GEL: TOPICAL | Status: AC

## 2014-03-20 NOTE — Progress Notes (Signed)
Patient ID: John [name], male   DOB: Jun 26, 1955, 58 y.o.   MRN: [id number]    John [name] is a 58 y.o. male who presents to the [facility] today for a same day appointment with complaints of pain in his left arm during dialysis. His concerns today include:  HPI: Left Arm Pain: Patient complaints of left arm pain during dialysis when accessing his AV fistula. Says that has always happened since he started dialysis. Pain only lasts while he is getting stuck. Has tried lidocaine cream and topical cold spray without significant relief. Reports putting on lidocaine cream about 1 hour before starting dialysis. Also says that his arm swells sometimes during dialysis and causes him to have to wait until it goes down before proceeding with dialysis.   ROS: As per HPI, otherwise all systems reviewed and are negative.  Past Medical History - Reviewed and updated Patient Active Problem List   Diagnosis Date Noted  . Left upper arm pain 03/20/2014  . Chronic pain syndrome 03/10/2014  . Peripheral vascular disease, unspecified 03/08/2014  . Aftercare following surgery of the circulatory system, [place] 03/08/2014  . Fecal incontinence 03/23/2013  . PVD (peripheral vascular disease) 07/28/2012  . hand pain , steal syndrome LEFT 07/28/2012  . Mitral valve regurgitation 07/05/2012  . Hyperparathyroidism, secondary renal 07/05/2012  . Anemia of renal disease 07/05/2012  . Cervical myelopathy 07/05/2012  . Occlusion and stenosis of carotid artery without mention of cerebral infarction 12/03/2011  . Atherosclerosis of native arteries of the extremities with intermittent claudication 11/12/2011  . Right carotid bruit 11/12/2011  . Knee pain, left 08/08/2011  . COPD (chronic obstructive pulmonary disease) 01/11/2011  . BACK PAIN, CHRONIC 12/15/2008  . Chronic kidney disease, stage 4, severely decreased GFR 01/21/2008  . NEUROPATHY, DIABETIC 08/06/2006  . DIABETES MELLITUS, II, COMPLICATIONS 67/05/4579  .  HYPERLIPIDEMIA 08/06/2006  . TOBACCO DEPENDENCE 08/06/2006  . HYPERTENSION, BENIGN SYSTEMIC 08/06/2006  . 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 OSTEOARTHRITIS, LOWER LEG 08/06/2006  . CEREBROVASCULAR ACCIDENT, HX OF 08/06/2006    Medications- reviewed and updated Current Outpatient Prescriptions  Medication Sig Dispense Refill  . ALPRAZolam (XANAX) 0.5 MG tablet Take 1-2 pills on call to MRI for claustrophobia, may take a third pill if needed.  3 tablet  0  . aspirin 81 MG chewable tablet Chew 81 mg by mouth daily.      . baclofen (LIORESAL) 10 MG tablet TAKE 1 TABLET BY MOUTH TWICE DAILY  60 tablet  0  . benzonatate (TESSALON) 200 MG capsule Take 1 capsule (200 mg total) by mouth 3 (three) times daily as needed for cough.  20 capsule  0  . betamethasone valerate ointment (VALISONE) 0.1 % Apply topically 2 (two) times daily.  30 g  1  . cephALEXin (KEFLEX) 500 MG capsule Take 1 capsule (500 mg total) by mouth 3 (three) times daily.  30 capsule  0  . darbepoetin (ARANESP) 100 MCG/0.5ML SOLN Inject 0.5 mLs (100 mcg total) into the skin every Monday.  4.2 mL  0  . docusate sodium (COLACE) 250 MG capsule Take 1 capsule (250 mg total) by mouth 2 (two) times daily.  60 capsule  5  . erythromycin ophthalmic ointment       . insulin glargine (LANTUS) 100 UNIT/ML injection Inject 0.06 mLs (6 Units total) into the skin daily.  30 mL  3  . insulin lispro (HUMALOG) 100 UNIT/ML injection Inject 5 Units into the skin 2 (two) times daily before lunch  and supper. Per sliding scale.      . lidocaine-EPINEPHrine-tetracaine (LET) GEL Apply topically to left arm 1 hour before dialysis  1 Syringe  2  . LORazepam (ATIVAN) 0.5 MG tablet Take 2 tablets (1 mg total) by mouth 3 (three) times daily as needed for anxiety.  15 tablet  0  . losartan-hydrochlorothiazide (HYZAAR) 100-12.5 MG per tablet       . metoprolol succinate (TOPROL-XL) 25 MG 24 hr tablet TAKE 1 TABLET BY MOUTH EVERY DAY  90 tablet  3  . ondansetron  (ZOFRAN) 4 MG tablet Take 1 tablet (4 mg total) by mouth every 8 (eight) hours as needed for nausea.  20 tablet  0  . oseltamivir (TAMIFLU) 30 MG capsule Take 1 capsule (30 mg total) by mouth daily.  5 capsule  0  . oxyCODONE-acetaminophen (PERCOCET) 7.5-325 MG per tablet Take one by mouth four times a day as needed for pain  2015  120 tablet  0  . PROAIR HFA 108 (90 BASE) MCG/ACT inhaler INHALE 2 PUFFS BY MOUTH EVERY 4 HOURS AS NEEDED FOR WHEEZING OR SHORTNESS OF BREATH.  8.5 g  0  . sodium polystyrene (KAYEXALATE) 15 GM/60ML suspension Take 60 mLs (15 g total) by mouth See admin instructions. Takes every 3-4 days  500 mL  0  . Spacer/Aero-Holding Chambers (BREATHERITE COLL SPACER ADULT) MISC 1 Device by Does not apply route once.  1 each  0  . sulfamethoxazole-trimethoprim (BACTRIM DS) 800-160 MG per tablet Take 1 tablet by mouth 2 (two) times daily.  20 tablet  1  . tamsulosin (FLOMAX) 0.4 MG CAPS capsule Take 0.4 mg by mouth daily.      . varenicline (CHANTIX) 1 MG tablet Take 1 tablet (1 mg total) by mouth 2 (two) times daily.  60 tablet  5  . vitamin C (ASCORBIC ACID) 500 MG tablet Take 500 mg by mouth daily.      . [DISCONTINUED] tiotropium (SPIRIVA HANDIHALER) 18 MCG inhalation capsule Place 1 capsule (18 mcg total) into inhaler and inhale daily.  30 capsule  2   No current facility-administered medications for this visit.   Facility-Administered Medications Ordered in Other Visits  Medication Dose Route Frequency Provider Last Rate Last Dose  . ferumoxytol (FERAHEME) 1,020 mg in sodium chloride 0.9 % 100 mL IVPB  1,020 mg Intravenous Once Louis Meckel, MD        Objective: Physical Exam: BP 146/74  Pulse 85  Temp(Src) 98 F (36.7 C) (Oral)  Wt 175 lb 6.4 oz (79.561 kg)  Gen: NAD, resting comfortably CV: RRR with no murmurs appreciated Lungs: NWOB, CTAB with no crackles, wheezes, or rhonchi Abdomen: Normal bowel sounds present. Soft, Nontender, Nondistended. Ext: no  edema, Left AV antecubital graft patent with palpable thrill with mild ecchymoses present Skin: warm, dry Neuro: grossly normal, moves all extremities  No results found for this or any previous visit (from the past 72 hour(s)).  A/P: See problem list  Left upper arm pain Left arm pain at site of needle insertion while being stuck during dialysis. Does not experience pain after being stuck. Has tried lidocaine/prilocaine and topical cold spray with moderate relief. Prescribed LET gel today to apply approximately 1 hour prior to dialysis and reinforced to the patient that some pain is to be expected with needles breaking the skin.     No orders of the defined types were placed in this encounter.    Meds ordered this encounter  Medications  .  lidocaine-EPINEPHrine-tetracaine (LET) GEL    Sig: Apply topically to left arm 1 hour before dialysis    Dispense:  1 Syringe    Refill:  2     Marla Pouliot M. Jerline Pain, [place] Resident PGY-1 03/20/2014 3:52 PM

## 2014-03-20 NOTE — Patient Instructions (Signed)
Thank you for coming to the clinic today. It was nice seeing you.  For your arm pain, you can try using the LET gel 1 hour before dialysis. You will experience some discomfort with getting stuck, but this medication may work better for you.  Please call or come into the clinic if your symptoms worsen.

## 2014-03-20 NOTE — Assessment & Plan Note (Signed)
Left arm pain at site of needle insertion while being stuck during dialysis. Does not experience pain after being stuck. Has tried lidocaine/prilocaine and topical cold spray with moderate relief. Prescribed LET gel today to apply approximately 1 hour prior to dialysis and reinforced to the patient that some pain is to be expected with needles breaking the skin.

## 2014-03-22 ENCOUNTER — Telehealth: Payer: Self-pay | Admitting: Family Medicine

## 2014-03-22 NOTE — Telephone Encounter (Signed)
Pt called and would like a referral for a second opinion on his vein's. He is currently seeing Dr. Doren Custard and they are not happy with them. jw

## 2014-03-24 ENCOUNTER — Telehealth: Payer: Self-pay | Admitting: Neurology

## 2014-03-24 DIAGNOSIS — G4733 Obstructive sleep apnea (adult) (pediatric): Secondary | ICD-10-CM

## 2014-03-24 DIAGNOSIS — G4710 Hypersomnia, unspecified: Secondary | ICD-10-CM

## 2014-03-24 DIAGNOSIS — G4761 Periodic limb movement disorder: Secondary | ICD-10-CM

## 2014-03-24 DIAGNOSIS — G4730 Sleep apnea, unspecified: Secondary | ICD-10-CM

## 2014-03-24 NOTE — Telephone Encounter (Signed)
Please call and notify patient that the recent sleep study confirmed the diagnosis of OSA. He did well with CPAP during the study with significant improvement of the respiratory events. Therefore, I would like start the patient on CPAP at home. I placed the order in the chart.   Arrange for CPAP set up at home through a DME company of patient's choice and fax/route report to PCP and referring MD (if other than PCP).   The patient will also need a follow up appointment with me in 6-8 weeks post set up that has to be scheduled; help the patient schedule this (in a follow-up slot).   Please re-enforce the importance of compliance with treatment and the need for us to monitor compliance data.   Once you have spoken to the patient and scheduled the return appointment, you may close this encounter, thanks,   Analyah Mcconnon, MD, PhD [facility] ([facility]    

## 2014-03-27 ENCOUNTER — Encounter: Payer: Self-pay | Admitting: *Deleted

## 2014-03-27 NOTE — Telephone Encounter (Signed)
spokw with John [name] and informed him of below. He is also coming in on Wednesday 10/21 to check fistula

## 2014-03-27 NOTE — Telephone Encounter (Signed)
Dear Dema Severin Team [facility] this is complicated because ALL of teh vascular surgeons in town are in same group. So if he wants to see someone else in that group he has to get Dr Scot Dock to agree---if he wants to go somewhere else like [facility] or [facility]--- I can try and get him an appointment.  Let him know and he can decide THANKS! [name]

## 2014-03-27 NOTE — Telephone Encounter (Signed)
Patient was contacted and provided the results of his overnight sleep study which revealed OSA.  Patient advised that CPAP therapy for home use had been recommended by Dr. Rexene Alberts.  Patient was in agreement with starting the therapy and was referred to [facility] for CPAP set up.  A copy of the report was mailed to the patient an a copy was faxed to Dr. Jeneen Rinks [place].

## 2014-03-29 ENCOUNTER — Encounter: Payer: Self-pay | Admitting: Family Medicine

## 2014-03-29 ENCOUNTER — Ambulatory Visit (INDEPENDENT_AMBULATORY_CARE_PROVIDER_SITE_OTHER): Payer: Medicaid Other | Admitting: Family Medicine

## 2014-03-29 VITALS — BP 114/62 | HR 65 | Temp 98.1°F | Ht 71.0 in | Wt 171.8 lb

## 2014-03-29 DIAGNOSIS — R21 Rash and other nonspecific skin eruption: Secondary | ICD-10-CM

## 2014-03-29 DIAGNOSIS — N184 Chronic kidney disease, stage 4 (severe): Secondary | ICD-10-CM

## 2014-03-29 DIAGNOSIS — M79622 Pain in left upper arm: Secondary | ICD-10-CM

## 2014-03-29 DIAGNOSIS — E1122 Type 2 diabetes mellitus with diabetic chronic kidney disease: Secondary | ICD-10-CM

## 2014-03-29 DIAGNOSIS — N189 Chronic kidney disease, unspecified: Secondary | ICD-10-CM

## 2014-03-30 MED ORDER — TRIAMCINOLONE ACETONIDE 0.1 % EX CREA: TOPICAL_CREAM | Freq: Two times a day (BID) | CUTANEOUS | Status: DC

## 2014-03-30 NOTE — Assessment & Plan Note (Addendum)
ESRD I'll see if they can get an A1c at his next dialysis visit.

## 2014-03-30 NOTE — Progress Notes (Signed)
   Subjective:    Patient ID: John [name], male    DOB: 1956-02-24, 57 y.o.   MRN: [id number]  HPI #1. Left upper arm pain secondary to fistula issues. He has a lot of pain with access of the AV fistula during dialysis. Considering having a second opinion by a different vascular surgeon. Has a lot of questions. #2. Last year some time he had a rash that broke out on his buttock area right above the gluteal cleft. I gave him some cream to resolve. Has come back. #3. Diabetes mellitus: No episodes of low blood sugar. Taking his medicines regularly. No unusual weight change.   Review of Systems See history of present illness    Objective:   Physical Exam  Vital signs are reviewed GENERAL: Well-developed male no acute distress EXTREMITY: Left upper extremity area of fistula reveals a thrill . There some hyperpigmented skin areas were they have been doing access. There is no increased sensitivity of the skin, no other unusual lesions. CV: Regular rate and rhythm lungs: Clear to auscultation bilaterally SKIN: Extremely dry skin with some excoriations on the buttock area but I see no specific lesion. No induration. No sign of infection.     Assessment & Plan:  For the rash I will give him a refill on the Kenalog cream.

## 2014-03-30 NOTE — Assessment & Plan Note (Signed)
If the ice massage during dialysis does not improve his pain then I would consider referral for some type of nerve block.

## 2014-03-30 NOTE — Assessment & Plan Note (Signed)
I found an article that talked about ice massage of the contralateral hand web space between the thumb and first finger, starting 10 minutes prior to dialysis. He's got try this.

## 2014-03-31 ENCOUNTER — Other Ambulatory Visit: Payer: Self-pay | Admitting: Family Medicine

## 2014-04-07 ENCOUNTER — Other Ambulatory Visit: Payer: Self-pay | Admitting: Family Medicine

## 2014-04-18 ENCOUNTER — Encounter: Payer: Self-pay | Admitting: Neurology

## 2014-04-20 ENCOUNTER — Other Ambulatory Visit: Payer: Self-pay | Admitting: Family Medicine

## 2014-04-20 MED ORDER — OXYCODONE-ACETAMINOPHEN 7.5-325 MG PO TABS: ORAL_TABLET | ORAL | Status: DC

## 2014-04-20 NOTE — Progress Notes (Signed)
Called patient and informed him that I have sat the rx for medication up front for him to pick up

## 2014-04-20 NOTE — Progress Notes (Signed)
Dear Dema Severin Team Please tell him his rx is up front THANKS! [name]

## 2014-05-03 ENCOUNTER — Other Ambulatory Visit: Payer: Self-pay

## 2014-05-03 DIAGNOSIS — T82591A Other mechanical complication of surgically created arteriovenous shunt, initial encounter: Secondary | ICD-10-CM

## 2014-05-11 ENCOUNTER — Encounter: Payer: Self-pay | Admitting: Vascular Surgery

## 2014-05-12 ENCOUNTER — Ambulatory Visit (INDEPENDENT_AMBULATORY_CARE_PROVIDER_SITE_OTHER): Payer: Medicaid Other | Admitting: Vascular Surgery

## 2014-05-12 ENCOUNTER — Ambulatory Visit (HOSPITAL_COMMUNITY)
Admission: RE | Admit: 2014-05-12 | Discharge: 2014-05-12 | Disposition: A | Discharge status: Home / Self Care | Payer: Medicare Other | Source: Ambulatory Visit | Attending: Vascular Surgery | Admitting: Vascular Surgery

## 2014-05-12 ENCOUNTER — Encounter: Payer: Self-pay | Admitting: Vascular Surgery

## 2014-05-12 VITALS — BP 185/96 | HR 94 | Ht 71.0 in | Wt 180.3 lb

## 2014-05-12 DIAGNOSIS — I10 Essential (primary) hypertension: Secondary | ICD-10-CM | POA: Insufficient documentation

## 2014-05-12 DIAGNOSIS — E119 Type 2 diabetes mellitus without complications: Secondary | ICD-10-CM | POA: Diagnosis not present

## 2014-05-12 DIAGNOSIS — Z992 Dependence on renal dialysis: Secondary | ICD-10-CM

## 2014-05-12 DIAGNOSIS — Z48812 Encounter for surgical aftercare following surgery on the circulatory system: Secondary | ICD-10-CM | POA: Diagnosis not present

## 2014-05-12 DIAGNOSIS — T82510A Breakdown (mechanical) of surgically created arteriovenous fistula, initial encounter: Secondary | ICD-10-CM | POA: Diagnosis present

## 2014-05-12 DIAGNOSIS — N186 End stage renal disease: Secondary | ICD-10-CM

## 2014-05-12 DIAGNOSIS — Z720 Tobacco use: Secondary | ICD-10-CM | POA: Diagnosis not present

## 2014-05-12 DIAGNOSIS — I739 Peripheral vascular disease, unspecified: Secondary | ICD-10-CM | POA: Diagnosis not present

## 2014-05-12 DIAGNOSIS — T82591A Other mechanical complication of surgically created arteriovenous shunt, initial encounter: Secondary | ICD-10-CM

## 2014-05-12 NOTE — Progress Notes (Signed)
    Established Dialysis Access  History of Present Illness  John [name] is a 58 y.o. (March 27, 1956) male who presents for re-evaluation for permanent access.  The patient is right hand dominant.  Previous access procedures have been completed in the left arm.  The patient's complication from previous access procedures include: possibly steal syndrome.  The patient has never had a previous PPM placed.  Pt current complains of numbness in left 4th and 5th fingers and coldness on dialysis.  This pt is a longer term patient of Dr. Scot Dock.  The patient's PMH, PSH, SH, FamHx, Med, and Allergies are unchanged from 01/05/13.  On ROS today: no rest pain, able to ambulate with help of walker  Physical Examination  Filed Vitals:   05/12/14 1002  BP: 185/96  Pulse: 94  Height: 5\' 11"  (1.803 m)  Weight: 180 lb 4.8 oz (81.784 kg)  SpO2: 100%   Body mass index is 25.16 kg/(m^2).  General: A&O x 3, WD, WN  Pulmonary: Sym exp, good air movt, CTAB, no rales, rhonchi, & wheezing  Cardiac: RRR, Nl S1, S2, no Murmurs, rubs or gallops  Vascular: Vessel Right Left  Radial Palpable Faintly Palpable  Ulnar Palpable Faintly Palpable  Brachial Palpable Palpable   Gastrointestinal: soft, NTND, -G/R, - HSM, - masses, - CVAT B  Musculoskeletal: M/S 5/5 throughout , Extremities without ischemic changes except  R toe amputations, palpable thrill in access in L UA, + bruit in access  Neurologic: Pain and light touch intact in extremities , Motor exam as listed above  Non-Invasive Vascular Imaging  B WBI (Date: 05/12/2014):  R: [place], radial bi, ulnar tri L: [place], radial tri, ulnar tri  24 mm Hg augmentation with fistula compression  Medical Decision Making  John [name] is a 58 y.o. male who presents with ESRD requiring hemodialysis, mild steal sx, L arm neurologic sx possible consistent with cubital tunnel compression   Don't think this patient meet criteria for hemodynamically significant steal  syndrome, so immediate ligation not recommended.  I am referring him to Hand Surgery for further evaluation of the L hand sx.   Pt will follow up after that evaluation.  Adele Barthel, MD [facility] Office: [phone] Pager: [phone]  05/12/2014, 10:08 AM

## 2014-05-16 ENCOUNTER — Other Ambulatory Visit: Payer: Self-pay | Admitting: Family Medicine

## 2014-05-17 ENCOUNTER — Telehealth: Payer: Self-pay | Admitting: Vascular Surgery

## 2014-05-17 NOTE — Telephone Encounter (Signed)
Spoke with patient. Appt for hand surgeon on 05/22/14 at 10:30 am Dr. Leanora Cover -  Told patient to call Tammy at [phone] today for information. Pt verbalized understanding.

## 2014-05-18 ENCOUNTER — Encounter (HOSPITAL_COMMUNITY): Payer: Self-pay | Admitting: Vascular Surgery

## 2014-05-23 ENCOUNTER — Other Ambulatory Visit: Payer: Self-pay | Admitting: Family Medicine

## 2014-05-23 MED ORDER — OXYCODONE-ACETAMINOPHEN 7.5-325 MG PO TABS: ORAL_TABLET | ORAL | Status: DC

## 2014-05-23 NOTE — Progress Notes (Signed)
Spoke with patient and informed him rx up front for pick up

## 2014-05-23 NOTE — Progress Notes (Signed)
Dear Dema Severin Team Please let him know this is up front at desk  (handwritten) [facility]! [name]

## 2014-06-09 HISTORY — PX: AMPUTATION TOE: SHX6595

## 2014-06-09 HISTORY — PX: TRANSMETATARSAL AMPUTATION: SHX6197

## 2014-06-11 DIAGNOSIS — R197 Diarrhea, unspecified: Secondary | ICD-10-CM | POA: Diagnosis not present

## 2014-06-11 DIAGNOSIS — E119 Type 2 diabetes mellitus without complications: Secondary | ICD-10-CM | POA: Diagnosis not present

## 2014-06-11 DIAGNOSIS — D509 Iron deficiency anemia, unspecified: Secondary | ICD-10-CM | POA: Diagnosis not present

## 2014-06-11 DIAGNOSIS — N186 End stage renal disease: Secondary | ICD-10-CM | POA: Diagnosis not present

## 2014-06-11 DIAGNOSIS — D631 Anemia in chronic kidney disease: Secondary | ICD-10-CM | POA: Diagnosis not present

## 2014-06-11 DIAGNOSIS — D689 Coagulation defect, unspecified: Secondary | ICD-10-CM | POA: Diagnosis not present

## 2014-06-11 DIAGNOSIS — D688 Other specified coagulation defects: Secondary | ICD-10-CM | POA: Diagnosis not present

## 2014-06-12 DIAGNOSIS — E1140 Type 2 diabetes mellitus with diabetic neuropathy, unspecified: Secondary | ICD-10-CM | POA: Diagnosis not present

## 2014-06-12 DIAGNOSIS — G5602 Carpal tunnel syndrome, left upper limb: Secondary | ICD-10-CM | POA: Diagnosis not present

## 2014-06-13 DIAGNOSIS — N186 End stage renal disease: Secondary | ICD-10-CM | POA: Diagnosis not present

## 2014-06-13 DIAGNOSIS — D631 Anemia in chronic kidney disease: Secondary | ICD-10-CM | POA: Diagnosis not present

## 2014-06-13 DIAGNOSIS — E119 Type 2 diabetes mellitus without complications: Secondary | ICD-10-CM | POA: Diagnosis not present

## 2014-06-13 DIAGNOSIS — D688 Other specified coagulation defects: Secondary | ICD-10-CM | POA: Diagnosis not present

## 2014-06-13 DIAGNOSIS — D509 Iron deficiency anemia, unspecified: Secondary | ICD-10-CM | POA: Diagnosis not present

## 2014-06-13 DIAGNOSIS — D689 Coagulation defect, unspecified: Secondary | ICD-10-CM | POA: Diagnosis not present

## 2014-06-13 DIAGNOSIS — R197 Diarrhea, unspecified: Secondary | ICD-10-CM | POA: Diagnosis not present

## 2014-06-14 ENCOUNTER — Encounter: Payer: Self-pay | Admitting: Family Medicine

## 2014-06-14 ENCOUNTER — Ambulatory Visit (INDEPENDENT_AMBULATORY_CARE_PROVIDER_SITE_OTHER): Payer: Medicare Other | Admitting: Family Medicine

## 2014-06-14 VITALS — BP 134/68 | HR 102 | Temp 98.2°F | Ht 71.0 in | Wt 185.4 lb

## 2014-06-14 DIAGNOSIS — N189 Chronic kidney disease, unspecified: Secondary | ICD-10-CM | POA: Diagnosis not present

## 2014-06-14 DIAGNOSIS — E1122 Type 2 diabetes mellitus with diabetic chronic kidney disease: Secondary | ICD-10-CM

## 2014-06-14 DIAGNOSIS — G894 Chronic pain syndrome: Secondary | ICD-10-CM | POA: Diagnosis not present

## 2014-06-14 DIAGNOSIS — G4733 Obstructive sleep apnea (adult) (pediatric): Secondary | ICD-10-CM | POA: Diagnosis not present

## 2014-06-14 LAB — POCT GLYCOSYLATED HEMOGLOBIN (HGB A1C): Hemoglobin A1C: 7.2

## 2014-06-14 MED ORDER — OXYCODONE-ACETAMINOPHEN 7.5-325 MG PO TABS: ORAL_TABLET | ORAL | Status: DC

## 2014-06-15 ENCOUNTER — Other Ambulatory Visit: Payer: Medicare Other

## 2014-06-15 DIAGNOSIS — E119 Type 2 diabetes mellitus without complications: Secondary | ICD-10-CM | POA: Diagnosis not present

## 2014-06-15 DIAGNOSIS — R197 Diarrhea, unspecified: Secondary | ICD-10-CM | POA: Diagnosis not present

## 2014-06-15 DIAGNOSIS — D631 Anemia in chronic kidney disease: Secondary | ICD-10-CM | POA: Diagnosis not present

## 2014-06-15 DIAGNOSIS — G894 Chronic pain syndrome: Secondary | ICD-10-CM | POA: Diagnosis not present

## 2014-06-15 DIAGNOSIS — N186 End stage renal disease: Secondary | ICD-10-CM | POA: Diagnosis not present

## 2014-06-15 DIAGNOSIS — D688 Other specified coagulation defects: Secondary | ICD-10-CM | POA: Diagnosis not present

## 2014-06-15 DIAGNOSIS — D689 Coagulation defect, unspecified: Secondary | ICD-10-CM | POA: Diagnosis not present

## 2014-06-15 DIAGNOSIS — D509 Iron deficiency anemia, unspecified: Secondary | ICD-10-CM | POA: Diagnosis not present

## 2014-06-15 NOTE — Assessment & Plan Note (Signed)
Blood sugar controls pretty good. Check A1c today.

## 2014-06-15 NOTE — Assessment & Plan Note (Signed)
Continue chronic pain medicine regimen.

## 2014-06-15 NOTE — Progress Notes (Signed)
   Subjective:    Patient ID: John [name], male    DOB: 03-16-1956, 59 y.o.   MRN: [id number]  HPI Follow up left arm pain that bothers him most when he is having access of his fistula for hemodialysis. Has seen a hand specialist who told him he had carpal tunnel symptoms I would be happy to provide him with surgical intervention. John [name] is not sure if he wants to do that #2. Chronic pain. Unchanged #3. Lots of home stressors. His wife just had to have toe amputation. She's been hospital several days.   Review of Systems No unusual weight change, no fever, sweats, chills, chest pain.    Objective:   Physical Exam  Vital signs reviewed. GENERAL: Well-developed, well-nourished, no acute distress. CARDIOVASCULAR: Regular rate and rhythm no murmur gallop or rub LUNGS: Clear to auscultation bilaterally, no rales or wheeze. ABDOMEN: Soft positive bowel sounds NEURO: Decreased sensation soft touch bilateral lower extremities and hands in stocking glove pattern. MSK: Movement of extremity x 3 with decreased strength of his right lower extremity secondary to prior CVA.        Assessment & Plan:

## 2014-06-15 NOTE — Progress Notes (Signed)
UDS DONE TODAY John [name]

## 2014-06-16 LAB — DRUG SCR UR, PAIN MGMT, REFLEX CONF
Amphetamine Screen, Ur: NEGATIVE
Barbiturate Quant, Ur: NEGATIVE
Benzodiazepines.: NEGATIVE
CREATININE, U: 207.95 mg/dL
Cocaine Metabolites: NEGATIVE
Marijuana Metabolite: NEGATIVE
Methadone: NEGATIVE
OPIATES: NEGATIVE
PROPOXYPHENE: NEGATIVE
Phencyclidine (PCP): NEGATIVE

## 2014-06-17 DIAGNOSIS — D631 Anemia in chronic kidney disease: Secondary | ICD-10-CM | POA: Diagnosis not present

## 2014-06-17 DIAGNOSIS — E119 Type 2 diabetes mellitus without complications: Secondary | ICD-10-CM | POA: Diagnosis not present

## 2014-06-17 DIAGNOSIS — D509 Iron deficiency anemia, unspecified: Secondary | ICD-10-CM | POA: Diagnosis not present

## 2014-06-17 DIAGNOSIS — D688 Other specified coagulation defects: Secondary | ICD-10-CM | POA: Diagnosis not present

## 2014-06-17 DIAGNOSIS — N186 End stage renal disease: Secondary | ICD-10-CM | POA: Diagnosis not present

## 2014-06-17 DIAGNOSIS — R197 Diarrhea, unspecified: Secondary | ICD-10-CM | POA: Diagnosis not present

## 2014-06-17 DIAGNOSIS — D689 Coagulation defect, unspecified: Secondary | ICD-10-CM | POA: Diagnosis not present

## 2014-06-20 DIAGNOSIS — D688 Other specified coagulation defects: Secondary | ICD-10-CM | POA: Diagnosis not present

## 2014-06-20 DIAGNOSIS — R197 Diarrhea, unspecified: Secondary | ICD-10-CM | POA: Diagnosis not present

## 2014-06-20 DIAGNOSIS — D631 Anemia in chronic kidney disease: Secondary | ICD-10-CM | POA: Diagnosis not present

## 2014-06-20 DIAGNOSIS — E119 Type 2 diabetes mellitus without complications: Secondary | ICD-10-CM | POA: Diagnosis not present

## 2014-06-20 DIAGNOSIS — N186 End stage renal disease: Secondary | ICD-10-CM | POA: Diagnosis not present

## 2014-06-20 DIAGNOSIS — D689 Coagulation defect, unspecified: Secondary | ICD-10-CM | POA: Diagnosis not present

## 2014-06-20 DIAGNOSIS — D509 Iron deficiency anemia, unspecified: Secondary | ICD-10-CM | POA: Diagnosis not present

## 2014-06-22 DIAGNOSIS — R197 Diarrhea, unspecified: Secondary | ICD-10-CM | POA: Diagnosis not present

## 2014-06-22 DIAGNOSIS — D509 Iron deficiency anemia, unspecified: Secondary | ICD-10-CM | POA: Diagnosis not present

## 2014-06-22 DIAGNOSIS — E119 Type 2 diabetes mellitus without complications: Secondary | ICD-10-CM | POA: Diagnosis not present

## 2014-06-22 DIAGNOSIS — D688 Other specified coagulation defects: Secondary | ICD-10-CM | POA: Diagnosis not present

## 2014-06-22 DIAGNOSIS — D689 Coagulation defect, unspecified: Secondary | ICD-10-CM | POA: Diagnosis not present

## 2014-06-22 DIAGNOSIS — D631 Anemia in chronic kidney disease: Secondary | ICD-10-CM | POA: Diagnosis not present

## 2014-06-22 DIAGNOSIS — N186 End stage renal disease: Secondary | ICD-10-CM | POA: Diagnosis not present

## 2014-06-24 DIAGNOSIS — D689 Coagulation defect, unspecified: Secondary | ICD-10-CM | POA: Diagnosis not present

## 2014-06-24 DIAGNOSIS — D509 Iron deficiency anemia, unspecified: Secondary | ICD-10-CM | POA: Diagnosis not present

## 2014-06-24 DIAGNOSIS — E119 Type 2 diabetes mellitus without complications: Secondary | ICD-10-CM | POA: Diagnosis not present

## 2014-06-24 DIAGNOSIS — D688 Other specified coagulation defects: Secondary | ICD-10-CM | POA: Diagnosis not present

## 2014-06-24 DIAGNOSIS — N186 End stage renal disease: Secondary | ICD-10-CM | POA: Diagnosis not present

## 2014-06-24 DIAGNOSIS — R197 Diarrhea, unspecified: Secondary | ICD-10-CM | POA: Diagnosis not present

## 2014-06-24 DIAGNOSIS — D631 Anemia in chronic kidney disease: Secondary | ICD-10-CM | POA: Diagnosis not present

## 2014-06-27 DIAGNOSIS — D509 Iron deficiency anemia, unspecified: Secondary | ICD-10-CM | POA: Diagnosis not present

## 2014-06-27 DIAGNOSIS — D631 Anemia in chronic kidney disease: Secondary | ICD-10-CM | POA: Diagnosis not present

## 2014-06-27 DIAGNOSIS — E119 Type 2 diabetes mellitus without complications: Secondary | ICD-10-CM | POA: Diagnosis not present

## 2014-06-27 DIAGNOSIS — N186 End stage renal disease: Secondary | ICD-10-CM | POA: Diagnosis not present

## 2014-06-27 DIAGNOSIS — D688 Other specified coagulation defects: Secondary | ICD-10-CM | POA: Diagnosis not present

## 2014-06-27 DIAGNOSIS — D689 Coagulation defect, unspecified: Secondary | ICD-10-CM | POA: Diagnosis not present

## 2014-06-27 DIAGNOSIS — R197 Diarrhea, unspecified: Secondary | ICD-10-CM | POA: Diagnosis not present

## 2014-06-29 DIAGNOSIS — R197 Diarrhea, unspecified: Secondary | ICD-10-CM | POA: Diagnosis not present

## 2014-06-29 DIAGNOSIS — D631 Anemia in chronic kidney disease: Secondary | ICD-10-CM | POA: Diagnosis not present

## 2014-06-29 DIAGNOSIS — E119 Type 2 diabetes mellitus without complications: Secondary | ICD-10-CM | POA: Diagnosis not present

## 2014-06-29 DIAGNOSIS — N186 End stage renal disease: Secondary | ICD-10-CM | POA: Diagnosis not present

## 2014-06-29 DIAGNOSIS — D688 Other specified coagulation defects: Secondary | ICD-10-CM | POA: Diagnosis not present

## 2014-06-29 DIAGNOSIS — D509 Iron deficiency anemia, unspecified: Secondary | ICD-10-CM | POA: Diagnosis not present

## 2014-06-29 DIAGNOSIS — D689 Coagulation defect, unspecified: Secondary | ICD-10-CM | POA: Diagnosis not present

## 2014-07-01 DIAGNOSIS — D689 Coagulation defect, unspecified: Secondary | ICD-10-CM | POA: Diagnosis not present

## 2014-07-01 DIAGNOSIS — N186 End stage renal disease: Secondary | ICD-10-CM | POA: Diagnosis not present

## 2014-07-01 DIAGNOSIS — D509 Iron deficiency anemia, unspecified: Secondary | ICD-10-CM | POA: Diagnosis not present

## 2014-07-01 DIAGNOSIS — D631 Anemia in chronic kidney disease: Secondary | ICD-10-CM | POA: Diagnosis not present

## 2014-07-01 DIAGNOSIS — D688 Other specified coagulation defects: Secondary | ICD-10-CM | POA: Diagnosis not present

## 2014-07-01 DIAGNOSIS — R197 Diarrhea, unspecified: Secondary | ICD-10-CM | POA: Diagnosis not present

## 2014-07-01 DIAGNOSIS — E119 Type 2 diabetes mellitus without complications: Secondary | ICD-10-CM | POA: Diagnosis not present

## 2014-07-03 ENCOUNTER — Encounter: Payer: Self-pay | Admitting: Family Medicine

## 2014-07-03 ENCOUNTER — Ambulatory Visit (INDEPENDENT_AMBULATORY_CARE_PROVIDER_SITE_OTHER): Payer: Medicare Other | Admitting: Family Medicine

## 2014-07-03 VITALS — BP 164/63 | HR 87 | Temp 98.1°F | Ht 71.0 in | Wt 190.5 lb

## 2014-07-03 DIAGNOSIS — W1809XA Striking against other object with subsequent fall, initial encounter: Secondary | ICD-10-CM

## 2014-07-03 DIAGNOSIS — M25362 Other instability, left knee: Secondary | ICD-10-CM | POA: Diagnosis not present

## 2014-07-03 DIAGNOSIS — R0781 Pleurodynia: Secondary | ICD-10-CM

## 2014-07-03 DIAGNOSIS — W1800XA Striking against unspecified object with subsequent fall, initial encounter: Secondary | ICD-10-CM | POA: Insufficient documentation

## 2014-07-03 DIAGNOSIS — S2020XA Contusion of thorax, unspecified, initial encounter: Secondary | ICD-10-CM | POA: Diagnosis not present

## 2014-07-03 DIAGNOSIS — M25369 Other instability, unspecified knee: Secondary | ICD-10-CM | POA: Insufficient documentation

## 2014-07-03 NOTE — Progress Notes (Signed)
Subjective: John [name] is a 59 y.o. male presenting to same day clinic for a fall.   Mr. Dehaas reports falling in his home 1/23 due to his left knee spontaneously and painlessly giving out. He fell with his back against the corner of a dresser. No head trauma or LOC. Denies preceding dizziness, visual changes, fatigue, palpitations, dehydration. He was walking with a cane as usual wearing his normal shoes and denies tripping on anything. His pain now is an 8/10 improving steadily since Saturday, worsened by movement and deep breathing. The normal pain medication he takes for chronic back pain improves this pain as well.   He endorses falling with greater frequency due to his left knee giving out. He had an insufficiency fracture of this years ago without any reported meniscal disease. To get up he places his hand over his knee to keep it from moving.   - Review of Systems: Per HPI.  - Smoking status noted  Objective: BP 164/63 mmHg  Pulse 87  Temp(Src) 98.1 F (36.7 C) (Oral)  Ht 5\' 11"  (1.803 m)  Wt 190 lb 8 oz (86.41 kg)  BMI 26.58 kg/m2 Gen: Pleasant 59 y.o. male in no distress HEENT: Atraumatic, strabismus CV: Regular rate, no murmur Back: Light ecchymosis oriented vertically just medial to the left posterior axillary line with generalized tenderness. No midline tenderness. +Point tenderness over ribs 9 and 10.  Left knee: No swelling or ecchymosis, no point tenderness over patella or joint line. Negative drawer signs, no instability with varus/valgus stress. Pain with McMurray's during rotation.  Right knee: No swelling, tenderness. No instability. Negative McMurray's  Neuro: No deficits in strength or sensation noted  Assessment/Plan: RAS KOLLMAN is a 59 y.o. male here for back pain following a fall.  CXR to r/o rib fx Refer to Dr. Nori Riis for left knee evaluation (?meniscal injury)

## 2014-07-03 NOTE — Assessment & Plan Note (Signed)
Rib pain on left side Concern for possible fracture given the point tenderness and pain with breathing.  - DG Chest 2 View; Future

## 2014-07-03 NOTE — Assessment & Plan Note (Signed)
Considering possible meniscal injury given instability and what may be a "locking" type symptom when rising from a sit. Given that this is the cause of multiple falls I think this should be addressed more closely so he is to schedule follow up with Dr. Nori Riis.

## 2014-07-03 NOTE — Patient Instructions (Addendum)
We need an xray to make sure your rib is not fractured. You can get this at [facility]. Take your regular prescription pain medications as directed.   Schedule a follow up appointment with Dr. Nori Riis to discuss the cause of your falls, specifically your left knee giving out.   Come back if you are having any difficulty breathing.

## 2014-07-04 ENCOUNTER — Ambulatory Visit (HOSPITAL_COMMUNITY)
Admission: RE | Admit: 2014-07-04 | Discharge: 2014-07-04 | Disposition: A | Discharge status: Home / Self Care | Payer: Medicare Other | Source: Ambulatory Visit | Attending: Family Medicine | Admitting: Family Medicine

## 2014-07-04 DIAGNOSIS — W1809XA Striking against other object with subsequent fall, initial encounter: Secondary | ICD-10-CM | POA: Diagnosis not present

## 2014-07-04 DIAGNOSIS — D689 Coagulation defect, unspecified: Secondary | ICD-10-CM | POA: Diagnosis not present

## 2014-07-04 DIAGNOSIS — S2020XA Contusion of thorax, unspecified, initial encounter: Secondary | ICD-10-CM | POA: Insufficient documentation

## 2014-07-04 DIAGNOSIS — D631 Anemia in chronic kidney disease: Secondary | ICD-10-CM | POA: Diagnosis not present

## 2014-07-04 DIAGNOSIS — D509 Iron deficiency anemia, unspecified: Secondary | ICD-10-CM | POA: Diagnosis not present

## 2014-07-04 DIAGNOSIS — R197 Diarrhea, unspecified: Secondary | ICD-10-CM | POA: Diagnosis not present

## 2014-07-04 DIAGNOSIS — N186 End stage renal disease: Secondary | ICD-10-CM | POA: Diagnosis not present

## 2014-07-04 DIAGNOSIS — E119 Type 2 diabetes mellitus without complications: Secondary | ICD-10-CM | POA: Diagnosis not present

## 2014-07-04 DIAGNOSIS — R0781 Pleurodynia: Secondary | ICD-10-CM | POA: Insufficient documentation

## 2014-07-04 DIAGNOSIS — D688 Other specified coagulation defects: Secondary | ICD-10-CM | POA: Diagnosis not present

## 2014-07-04 DIAGNOSIS — S299XXA Unspecified injury of thorax, initial encounter: Secondary | ICD-10-CM | POA: Diagnosis not present

## 2014-07-05 ENCOUNTER — Encounter: Payer: Self-pay | Admitting: Family Medicine

## 2014-07-06 DIAGNOSIS — D688 Other specified coagulation defects: Secondary | ICD-10-CM | POA: Diagnosis not present

## 2014-07-06 DIAGNOSIS — E119 Type 2 diabetes mellitus without complications: Secondary | ICD-10-CM | POA: Diagnosis not present

## 2014-07-06 DIAGNOSIS — D509 Iron deficiency anemia, unspecified: Secondary | ICD-10-CM | POA: Diagnosis not present

## 2014-07-06 DIAGNOSIS — N186 End stage renal disease: Secondary | ICD-10-CM | POA: Diagnosis not present

## 2014-07-06 DIAGNOSIS — D689 Coagulation defect, unspecified: Secondary | ICD-10-CM | POA: Diagnosis not present

## 2014-07-06 DIAGNOSIS — D631 Anemia in chronic kidney disease: Secondary | ICD-10-CM | POA: Diagnosis not present

## 2014-07-06 DIAGNOSIS — R197 Diarrhea, unspecified: Secondary | ICD-10-CM | POA: Diagnosis not present

## 2014-07-08 DIAGNOSIS — R197 Diarrhea, unspecified: Secondary | ICD-10-CM | POA: Diagnosis not present

## 2014-07-08 DIAGNOSIS — D689 Coagulation defect, unspecified: Secondary | ICD-10-CM | POA: Diagnosis not present

## 2014-07-08 DIAGNOSIS — D509 Iron deficiency anemia, unspecified: Secondary | ICD-10-CM | POA: Diagnosis not present

## 2014-07-08 DIAGNOSIS — N186 End stage renal disease: Secondary | ICD-10-CM | POA: Diagnosis not present

## 2014-07-08 DIAGNOSIS — E119 Type 2 diabetes mellitus without complications: Secondary | ICD-10-CM | POA: Diagnosis not present

## 2014-07-08 DIAGNOSIS — D631 Anemia in chronic kidney disease: Secondary | ICD-10-CM | POA: Diagnosis not present

## 2014-07-08 DIAGNOSIS — D688 Other specified coagulation defects: Secondary | ICD-10-CM | POA: Diagnosis not present

## 2014-07-09 DIAGNOSIS — Z992 Dependence on renal dialysis: Secondary | ICD-10-CM | POA: Diagnosis not present

## 2014-07-09 DIAGNOSIS — N186 End stage renal disease: Secondary | ICD-10-CM | POA: Diagnosis not present

## 2014-07-11 DIAGNOSIS — D509 Iron deficiency anemia, unspecified: Secondary | ICD-10-CM | POA: Diagnosis not present

## 2014-07-11 DIAGNOSIS — D688 Other specified coagulation defects: Secondary | ICD-10-CM | POA: Diagnosis not present

## 2014-07-11 DIAGNOSIS — E119 Type 2 diabetes mellitus without complications: Secondary | ICD-10-CM | POA: Diagnosis not present

## 2014-07-11 DIAGNOSIS — N186 End stage renal disease: Secondary | ICD-10-CM | POA: Diagnosis not present

## 2014-07-11 DIAGNOSIS — D631 Anemia in chronic kidney disease: Secondary | ICD-10-CM | POA: Diagnosis not present

## 2014-07-11 DIAGNOSIS — D689 Coagulation defect, unspecified: Secondary | ICD-10-CM | POA: Diagnosis not present

## 2014-07-11 DIAGNOSIS — N2581 Secondary hyperparathyroidism of renal origin: Secondary | ICD-10-CM | POA: Diagnosis not present

## 2014-07-11 DIAGNOSIS — R197 Diarrhea, unspecified: Secondary | ICD-10-CM | POA: Diagnosis not present

## 2014-07-11 DIAGNOSIS — E1129 Type 2 diabetes mellitus with other diabetic kidney complication: Secondary | ICD-10-CM | POA: Diagnosis not present

## 2014-07-13 DIAGNOSIS — E1129 Type 2 diabetes mellitus with other diabetic kidney complication: Secondary | ICD-10-CM | POA: Diagnosis not present

## 2014-07-13 DIAGNOSIS — D689 Coagulation defect, unspecified: Secondary | ICD-10-CM | POA: Diagnosis not present

## 2014-07-13 DIAGNOSIS — D631 Anemia in chronic kidney disease: Secondary | ICD-10-CM | POA: Diagnosis not present

## 2014-07-13 DIAGNOSIS — E119 Type 2 diabetes mellitus without complications: Secondary | ICD-10-CM | POA: Diagnosis not present

## 2014-07-13 DIAGNOSIS — R197 Diarrhea, unspecified: Secondary | ICD-10-CM | POA: Diagnosis not present

## 2014-07-13 DIAGNOSIS — N186 End stage renal disease: Secondary | ICD-10-CM | POA: Diagnosis not present

## 2014-07-13 DIAGNOSIS — N2581 Secondary hyperparathyroidism of renal origin: Secondary | ICD-10-CM | POA: Diagnosis not present

## 2014-07-13 DIAGNOSIS — D688 Other specified coagulation defects: Secondary | ICD-10-CM | POA: Diagnosis not present

## 2014-07-13 DIAGNOSIS — D509 Iron deficiency anemia, unspecified: Secondary | ICD-10-CM | POA: Diagnosis not present

## 2014-07-14 ENCOUNTER — Encounter: Payer: Self-pay | Admitting: Family Medicine

## 2014-07-15 DIAGNOSIS — D631 Anemia in chronic kidney disease: Secondary | ICD-10-CM | POA: Diagnosis not present

## 2014-07-15 DIAGNOSIS — N186 End stage renal disease: Secondary | ICD-10-CM | POA: Diagnosis not present

## 2014-07-15 DIAGNOSIS — D689 Coagulation defect, unspecified: Secondary | ICD-10-CM | POA: Diagnosis not present

## 2014-07-15 DIAGNOSIS — E1129 Type 2 diabetes mellitus with other diabetic kidney complication: Secondary | ICD-10-CM | POA: Diagnosis not present

## 2014-07-15 DIAGNOSIS — D688 Other specified coagulation defects: Secondary | ICD-10-CM | POA: Diagnosis not present

## 2014-07-15 DIAGNOSIS — D509 Iron deficiency anemia, unspecified: Secondary | ICD-10-CM | POA: Diagnosis not present

## 2014-07-15 DIAGNOSIS — E119 Type 2 diabetes mellitus without complications: Secondary | ICD-10-CM | POA: Diagnosis not present

## 2014-07-15 DIAGNOSIS — R197 Diarrhea, unspecified: Secondary | ICD-10-CM | POA: Diagnosis not present

## 2014-07-15 DIAGNOSIS — G4733 Obstructive sleep apnea (adult) (pediatric): Secondary | ICD-10-CM | POA: Diagnosis not present

## 2014-07-15 DIAGNOSIS — N2581 Secondary hyperparathyroidism of renal origin: Secondary | ICD-10-CM | POA: Diagnosis not present

## 2014-07-18 DIAGNOSIS — R197 Diarrhea, unspecified: Secondary | ICD-10-CM | POA: Diagnosis not present

## 2014-07-18 DIAGNOSIS — N186 End stage renal disease: Secondary | ICD-10-CM | POA: Diagnosis not present

## 2014-07-18 DIAGNOSIS — D688 Other specified coagulation defects: Secondary | ICD-10-CM | POA: Diagnosis not present

## 2014-07-18 DIAGNOSIS — E1129 Type 2 diabetes mellitus with other diabetic kidney complication: Secondary | ICD-10-CM | POA: Diagnosis not present

## 2014-07-18 DIAGNOSIS — D509 Iron deficiency anemia, unspecified: Secondary | ICD-10-CM | POA: Diagnosis not present

## 2014-07-18 DIAGNOSIS — E119 Type 2 diabetes mellitus without complications: Secondary | ICD-10-CM | POA: Diagnosis not present

## 2014-07-18 DIAGNOSIS — D689 Coagulation defect, unspecified: Secondary | ICD-10-CM | POA: Diagnosis not present

## 2014-07-18 DIAGNOSIS — D631 Anemia in chronic kidney disease: Secondary | ICD-10-CM | POA: Diagnosis not present

## 2014-07-18 DIAGNOSIS — N2581 Secondary hyperparathyroidism of renal origin: Secondary | ICD-10-CM | POA: Diagnosis not present

## 2014-07-19 ENCOUNTER — Encounter: Payer: Self-pay | Admitting: Family Medicine

## 2014-07-19 ENCOUNTER — Ambulatory Visit (INDEPENDENT_AMBULATORY_CARE_PROVIDER_SITE_OTHER): Payer: Medicare Other | Admitting: Family Medicine

## 2014-07-19 VITALS — BP 149/59 | HR 87 | Temp 98.2°F | Ht 71.0 in | Wt 190.4 lb

## 2014-07-19 DIAGNOSIS — L600 Ingrowing nail: Secondary | ICD-10-CM | POA: Diagnosis not present

## 2014-07-19 NOTE — Progress Notes (Signed)
   Subjective:    Patient ID: John [name], male    DOB: [birth year], 58 y.o.   MRN: [id number]  HPI Here for follow-up of his right great toe. The other day he had some tenderness his wife had some pus coming out of it.   Review of Systems Has had no fever. He's had some discomfort under both of the great toenails chronically but that's really not changed the last couple of weeks.    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 Bilaterally his great toenails are extremely curved particularly the left one.  no sign of infection.      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 I see no sign of current infection. I think given his other significant medical problems he would best be served by seeing podiatry so we will set that up and calling with appointment.

## 2014-07-20 DIAGNOSIS — D688 Other specified coagulation defects: Secondary | ICD-10-CM | POA: Diagnosis not present

## 2014-07-20 DIAGNOSIS — D509 Iron deficiency anemia, unspecified: Secondary | ICD-10-CM | POA: Diagnosis not present

## 2014-07-20 DIAGNOSIS — D689 Coagulation defect, unspecified: Secondary | ICD-10-CM | POA: Diagnosis not present

## 2014-07-20 DIAGNOSIS — N2581 Secondary hyperparathyroidism of renal origin: Secondary | ICD-10-CM | POA: Diagnosis not present

## 2014-07-20 DIAGNOSIS — R197 Diarrhea, unspecified: Secondary | ICD-10-CM | POA: Diagnosis not present

## 2014-07-20 DIAGNOSIS — D631 Anemia in chronic kidney disease: Secondary | ICD-10-CM | POA: Diagnosis not present

## 2014-07-20 DIAGNOSIS — E1129 Type 2 diabetes mellitus with other diabetic kidney complication: Secondary | ICD-10-CM | POA: Diagnosis not present

## 2014-07-20 DIAGNOSIS — N186 End stage renal disease: Secondary | ICD-10-CM | POA: Diagnosis not present

## 2014-07-20 DIAGNOSIS — E119 Type 2 diabetes mellitus without complications: Secondary | ICD-10-CM | POA: Diagnosis not present

## 2014-07-22 DIAGNOSIS — D509 Iron deficiency anemia, unspecified: Secondary | ICD-10-CM | POA: Diagnosis not present

## 2014-07-22 DIAGNOSIS — D688 Other specified coagulation defects: Secondary | ICD-10-CM | POA: Diagnosis not present

## 2014-07-22 DIAGNOSIS — D689 Coagulation defect, unspecified: Secondary | ICD-10-CM | POA: Diagnosis not present

## 2014-07-22 DIAGNOSIS — E1129 Type 2 diabetes mellitus with other diabetic kidney complication: Secondary | ICD-10-CM | POA: Diagnosis not present

## 2014-07-22 DIAGNOSIS — N186 End stage renal disease: Secondary | ICD-10-CM | POA: Diagnosis not present

## 2014-07-22 DIAGNOSIS — R197 Diarrhea, unspecified: Secondary | ICD-10-CM | POA: Diagnosis not present

## 2014-07-22 DIAGNOSIS — N2581 Secondary hyperparathyroidism of renal origin: Secondary | ICD-10-CM | POA: Diagnosis not present

## 2014-07-22 DIAGNOSIS — D631 Anemia in chronic kidney disease: Secondary | ICD-10-CM | POA: Diagnosis not present

## 2014-07-22 DIAGNOSIS — E119 Type 2 diabetes mellitus without complications: Secondary | ICD-10-CM | POA: Diagnosis not present

## 2014-07-25 DIAGNOSIS — D631 Anemia in chronic kidney disease: Secondary | ICD-10-CM | POA: Diagnosis not present

## 2014-07-25 DIAGNOSIS — N186 End stage renal disease: Secondary | ICD-10-CM | POA: Diagnosis not present

## 2014-07-25 DIAGNOSIS — E119 Type 2 diabetes mellitus without complications: Secondary | ICD-10-CM | POA: Diagnosis not present

## 2014-07-25 DIAGNOSIS — R197 Diarrhea, unspecified: Secondary | ICD-10-CM | POA: Diagnosis not present

## 2014-07-25 DIAGNOSIS — D688 Other specified coagulation defects: Secondary | ICD-10-CM | POA: Diagnosis not present

## 2014-07-25 DIAGNOSIS — D689 Coagulation defect, unspecified: Secondary | ICD-10-CM | POA: Diagnosis not present

## 2014-07-25 DIAGNOSIS — D509 Iron deficiency anemia, unspecified: Secondary | ICD-10-CM | POA: Diagnosis not present

## 2014-07-25 DIAGNOSIS — E1129 Type 2 diabetes mellitus with other diabetic kidney complication: Secondary | ICD-10-CM | POA: Diagnosis not present

## 2014-07-25 DIAGNOSIS — N2581 Secondary hyperparathyroidism of renal origin: Secondary | ICD-10-CM | POA: Diagnosis not present

## 2014-07-27 DIAGNOSIS — D509 Iron deficiency anemia, unspecified: Secondary | ICD-10-CM | POA: Diagnosis not present

## 2014-07-27 DIAGNOSIS — N186 End stage renal disease: Secondary | ICD-10-CM | POA: Diagnosis not present

## 2014-07-27 DIAGNOSIS — D631 Anemia in chronic kidney disease: Secondary | ICD-10-CM | POA: Diagnosis not present

## 2014-07-27 DIAGNOSIS — D689 Coagulation defect, unspecified: Secondary | ICD-10-CM | POA: Diagnosis not present

## 2014-07-27 DIAGNOSIS — N2581 Secondary hyperparathyroidism of renal origin: Secondary | ICD-10-CM | POA: Diagnosis not present

## 2014-07-27 DIAGNOSIS — E119 Type 2 diabetes mellitus without complications: Secondary | ICD-10-CM | POA: Diagnosis not present

## 2014-07-27 DIAGNOSIS — R197 Diarrhea, unspecified: Secondary | ICD-10-CM | POA: Diagnosis not present

## 2014-07-27 DIAGNOSIS — E1129 Type 2 diabetes mellitus with other diabetic kidney complication: Secondary | ICD-10-CM | POA: Diagnosis not present

## 2014-07-27 DIAGNOSIS — D688 Other specified coagulation defects: Secondary | ICD-10-CM | POA: Diagnosis not present

## 2014-07-29 DIAGNOSIS — D689 Coagulation defect, unspecified: Secondary | ICD-10-CM | POA: Diagnosis not present

## 2014-07-29 DIAGNOSIS — N2581 Secondary hyperparathyroidism of renal origin: Secondary | ICD-10-CM | POA: Diagnosis not present

## 2014-07-29 DIAGNOSIS — D509 Iron deficiency anemia, unspecified: Secondary | ICD-10-CM | POA: Diagnosis not present

## 2014-07-29 DIAGNOSIS — D631 Anemia in chronic kidney disease: Secondary | ICD-10-CM | POA: Diagnosis not present

## 2014-07-29 DIAGNOSIS — E1129 Type 2 diabetes mellitus with other diabetic kidney complication: Secondary | ICD-10-CM | POA: Diagnosis not present

## 2014-07-29 DIAGNOSIS — E119 Type 2 diabetes mellitus without complications: Secondary | ICD-10-CM | POA: Diagnosis not present

## 2014-07-29 DIAGNOSIS — N186 End stage renal disease: Secondary | ICD-10-CM | POA: Diagnosis not present

## 2014-07-29 DIAGNOSIS — R197 Diarrhea, unspecified: Secondary | ICD-10-CM | POA: Diagnosis not present

## 2014-07-29 DIAGNOSIS — D688 Other specified coagulation defects: Secondary | ICD-10-CM | POA: Diagnosis not present

## 2014-07-31 DIAGNOSIS — G4733 Obstructive sleep apnea (adult) (pediatric): Secondary | ICD-10-CM | POA: Diagnosis not present

## 2014-08-01 DIAGNOSIS — D689 Coagulation defect, unspecified: Secondary | ICD-10-CM | POA: Diagnosis not present

## 2014-08-01 DIAGNOSIS — D688 Other specified coagulation defects: Secondary | ICD-10-CM | POA: Diagnosis not present

## 2014-08-01 DIAGNOSIS — E1129 Type 2 diabetes mellitus with other diabetic kidney complication: Secondary | ICD-10-CM | POA: Diagnosis not present

## 2014-08-01 DIAGNOSIS — N2581 Secondary hyperparathyroidism of renal origin: Secondary | ICD-10-CM | POA: Diagnosis not present

## 2014-08-01 DIAGNOSIS — D631 Anemia in chronic kidney disease: Secondary | ICD-10-CM | POA: Diagnosis not present

## 2014-08-01 DIAGNOSIS — R197 Diarrhea, unspecified: Secondary | ICD-10-CM | POA: Diagnosis not present

## 2014-08-01 DIAGNOSIS — E119 Type 2 diabetes mellitus without complications: Secondary | ICD-10-CM | POA: Diagnosis not present

## 2014-08-01 DIAGNOSIS — D509 Iron deficiency anemia, unspecified: Secondary | ICD-10-CM | POA: Diagnosis not present

## 2014-08-01 DIAGNOSIS — N186 End stage renal disease: Secondary | ICD-10-CM | POA: Diagnosis not present

## 2014-08-03 DIAGNOSIS — N2581 Secondary hyperparathyroidism of renal origin: Secondary | ICD-10-CM | POA: Diagnosis not present

## 2014-08-03 DIAGNOSIS — R197 Diarrhea, unspecified: Secondary | ICD-10-CM | POA: Diagnosis not present

## 2014-08-03 DIAGNOSIS — D631 Anemia in chronic kidney disease: Secondary | ICD-10-CM | POA: Diagnosis not present

## 2014-08-03 DIAGNOSIS — N186 End stage renal disease: Secondary | ICD-10-CM | POA: Diagnosis not present

## 2014-08-03 DIAGNOSIS — D688 Other specified coagulation defects: Secondary | ICD-10-CM | POA: Diagnosis not present

## 2014-08-03 DIAGNOSIS — D689 Coagulation defect, unspecified: Secondary | ICD-10-CM | POA: Diagnosis not present

## 2014-08-03 DIAGNOSIS — E1129 Type 2 diabetes mellitus with other diabetic kidney complication: Secondary | ICD-10-CM | POA: Diagnosis not present

## 2014-08-03 DIAGNOSIS — E119 Type 2 diabetes mellitus without complications: Secondary | ICD-10-CM | POA: Diagnosis not present

## 2014-08-03 DIAGNOSIS — D509 Iron deficiency anemia, unspecified: Secondary | ICD-10-CM | POA: Diagnosis not present

## 2014-08-05 DIAGNOSIS — E1129 Type 2 diabetes mellitus with other diabetic kidney complication: Secondary | ICD-10-CM | POA: Diagnosis not present

## 2014-08-05 DIAGNOSIS — D689 Coagulation defect, unspecified: Secondary | ICD-10-CM | POA: Diagnosis not present

## 2014-08-05 DIAGNOSIS — D631 Anemia in chronic kidney disease: Secondary | ICD-10-CM | POA: Diagnosis not present

## 2014-08-05 DIAGNOSIS — D509 Iron deficiency anemia, unspecified: Secondary | ICD-10-CM | POA: Diagnosis not present

## 2014-08-05 DIAGNOSIS — D688 Other specified coagulation defects: Secondary | ICD-10-CM | POA: Diagnosis not present

## 2014-08-05 DIAGNOSIS — N2581 Secondary hyperparathyroidism of renal origin: Secondary | ICD-10-CM | POA: Diagnosis not present

## 2014-08-05 DIAGNOSIS — E119 Type 2 diabetes mellitus without complications: Secondary | ICD-10-CM | POA: Diagnosis not present

## 2014-08-05 DIAGNOSIS — N186 End stage renal disease: Secondary | ICD-10-CM | POA: Diagnosis not present

## 2014-08-05 DIAGNOSIS — R197 Diarrhea, unspecified: Secondary | ICD-10-CM | POA: Diagnosis not present

## 2014-08-07 DIAGNOSIS — N186 End stage renal disease: Secondary | ICD-10-CM | POA: Diagnosis not present

## 2014-08-07 DIAGNOSIS — Z992 Dependence on renal dialysis: Secondary | ICD-10-CM | POA: Diagnosis not present

## 2014-08-08 DIAGNOSIS — E119 Type 2 diabetes mellitus without complications: Secondary | ICD-10-CM | POA: Diagnosis not present

## 2014-08-08 DIAGNOSIS — D689 Coagulation defect, unspecified: Secondary | ICD-10-CM | POA: Diagnosis not present

## 2014-08-08 DIAGNOSIS — D631 Anemia in chronic kidney disease: Secondary | ICD-10-CM | POA: Diagnosis not present

## 2014-08-08 DIAGNOSIS — N186 End stage renal disease: Secondary | ICD-10-CM | POA: Diagnosis not present

## 2014-08-08 DIAGNOSIS — D509 Iron deficiency anemia, unspecified: Secondary | ICD-10-CM | POA: Diagnosis not present

## 2014-08-08 DIAGNOSIS — D688 Other specified coagulation defects: Secondary | ICD-10-CM | POA: Diagnosis not present

## 2014-08-10 DIAGNOSIS — E119 Type 2 diabetes mellitus without complications: Secondary | ICD-10-CM | POA: Diagnosis not present

## 2014-08-10 DIAGNOSIS — D689 Coagulation defect, unspecified: Secondary | ICD-10-CM | POA: Diagnosis not present

## 2014-08-10 DIAGNOSIS — N186 End stage renal disease: Secondary | ICD-10-CM | POA: Diagnosis not present

## 2014-08-10 DIAGNOSIS — D631 Anemia in chronic kidney disease: Secondary | ICD-10-CM | POA: Diagnosis not present

## 2014-08-10 DIAGNOSIS — D509 Iron deficiency anemia, unspecified: Secondary | ICD-10-CM | POA: Diagnosis not present

## 2014-08-10 DIAGNOSIS — D688 Other specified coagulation defects: Secondary | ICD-10-CM | POA: Diagnosis not present

## 2014-08-12 DIAGNOSIS — D689 Coagulation defect, unspecified: Secondary | ICD-10-CM | POA: Diagnosis not present

## 2014-08-12 DIAGNOSIS — E119 Type 2 diabetes mellitus without complications: Secondary | ICD-10-CM | POA: Diagnosis not present

## 2014-08-12 DIAGNOSIS — D509 Iron deficiency anemia, unspecified: Secondary | ICD-10-CM | POA: Diagnosis not present

## 2014-08-12 DIAGNOSIS — D631 Anemia in chronic kidney disease: Secondary | ICD-10-CM | POA: Diagnosis not present

## 2014-08-12 DIAGNOSIS — N186 End stage renal disease: Secondary | ICD-10-CM | POA: Diagnosis not present

## 2014-08-12 DIAGNOSIS — D688 Other specified coagulation defects: Secondary | ICD-10-CM | POA: Diagnosis not present

## 2014-08-13 DIAGNOSIS — G4733 Obstructive sleep apnea (adult) (pediatric): Secondary | ICD-10-CM | POA: Diagnosis not present

## 2014-08-15 DIAGNOSIS — E119 Type 2 diabetes mellitus without complications: Secondary | ICD-10-CM | POA: Diagnosis not present

## 2014-08-15 DIAGNOSIS — D631 Anemia in chronic kidney disease: Secondary | ICD-10-CM | POA: Diagnosis not present

## 2014-08-15 DIAGNOSIS — D689 Coagulation defect, unspecified: Secondary | ICD-10-CM | POA: Diagnosis not present

## 2014-08-15 DIAGNOSIS — D688 Other specified coagulation defects: Secondary | ICD-10-CM | POA: Diagnosis not present

## 2014-08-15 DIAGNOSIS — N186 End stage renal disease: Secondary | ICD-10-CM | POA: Diagnosis not present

## 2014-08-15 DIAGNOSIS — D509 Iron deficiency anemia, unspecified: Secondary | ICD-10-CM | POA: Diagnosis not present

## 2014-08-17 DIAGNOSIS — N186 End stage renal disease: Secondary | ICD-10-CM | POA: Diagnosis not present

## 2014-08-17 DIAGNOSIS — D509 Iron deficiency anemia, unspecified: Secondary | ICD-10-CM | POA: Diagnosis not present

## 2014-08-17 DIAGNOSIS — D688 Other specified coagulation defects: Secondary | ICD-10-CM | POA: Diagnosis not present

## 2014-08-17 DIAGNOSIS — D631 Anemia in chronic kidney disease: Secondary | ICD-10-CM | POA: Diagnosis not present

## 2014-08-17 DIAGNOSIS — E119 Type 2 diabetes mellitus without complications: Secondary | ICD-10-CM | POA: Diagnosis not present

## 2014-08-17 DIAGNOSIS — D689 Coagulation defect, unspecified: Secondary | ICD-10-CM | POA: Diagnosis not present

## 2014-08-19 DIAGNOSIS — D689 Coagulation defect, unspecified: Secondary | ICD-10-CM | POA: Diagnosis not present

## 2014-08-19 DIAGNOSIS — D688 Other specified coagulation defects: Secondary | ICD-10-CM | POA: Diagnosis not present

## 2014-08-19 DIAGNOSIS — N186 End stage renal disease: Secondary | ICD-10-CM | POA: Diagnosis not present

## 2014-08-19 DIAGNOSIS — D509 Iron deficiency anemia, unspecified: Secondary | ICD-10-CM | POA: Diagnosis not present

## 2014-08-19 DIAGNOSIS — D631 Anemia in chronic kidney disease: Secondary | ICD-10-CM | POA: Diagnosis not present

## 2014-08-19 DIAGNOSIS — E119 Type 2 diabetes mellitus without complications: Secondary | ICD-10-CM | POA: Diagnosis not present

## 2014-08-22 DIAGNOSIS — D688 Other specified coagulation defects: Secondary | ICD-10-CM | POA: Diagnosis not present

## 2014-08-22 DIAGNOSIS — D509 Iron deficiency anemia, unspecified: Secondary | ICD-10-CM | POA: Diagnosis not present

## 2014-08-22 DIAGNOSIS — E119 Type 2 diabetes mellitus without complications: Secondary | ICD-10-CM | POA: Diagnosis not present

## 2014-08-22 DIAGNOSIS — N186 End stage renal disease: Secondary | ICD-10-CM | POA: Diagnosis not present

## 2014-08-22 DIAGNOSIS — D631 Anemia in chronic kidney disease: Secondary | ICD-10-CM | POA: Diagnosis not present

## 2014-08-22 DIAGNOSIS — D689 Coagulation defect, unspecified: Secondary | ICD-10-CM | POA: Diagnosis not present

## 2014-08-24 DIAGNOSIS — N186 End stage renal disease: Secondary | ICD-10-CM | POA: Diagnosis not present

## 2014-08-24 DIAGNOSIS — D688 Other specified coagulation defects: Secondary | ICD-10-CM | POA: Diagnosis not present

## 2014-08-24 DIAGNOSIS — D631 Anemia in chronic kidney disease: Secondary | ICD-10-CM | POA: Diagnosis not present

## 2014-08-24 DIAGNOSIS — D509 Iron deficiency anemia, unspecified: Secondary | ICD-10-CM | POA: Diagnosis not present

## 2014-08-24 DIAGNOSIS — E119 Type 2 diabetes mellitus without complications: Secondary | ICD-10-CM | POA: Diagnosis not present

## 2014-08-24 DIAGNOSIS — D689 Coagulation defect, unspecified: Secondary | ICD-10-CM | POA: Diagnosis not present

## 2014-08-26 DIAGNOSIS — N186 End stage renal disease: Secondary | ICD-10-CM | POA: Diagnosis not present

## 2014-08-26 DIAGNOSIS — D509 Iron deficiency anemia, unspecified: Secondary | ICD-10-CM | POA: Diagnosis not present

## 2014-08-26 DIAGNOSIS — D689 Coagulation defect, unspecified: Secondary | ICD-10-CM | POA: Diagnosis not present

## 2014-08-26 DIAGNOSIS — D631 Anemia in chronic kidney disease: Secondary | ICD-10-CM | POA: Diagnosis not present

## 2014-08-26 DIAGNOSIS — D688 Other specified coagulation defects: Secondary | ICD-10-CM | POA: Diagnosis not present

## 2014-08-26 DIAGNOSIS — E119 Type 2 diabetes mellitus without complications: Secondary | ICD-10-CM | POA: Diagnosis not present

## 2014-08-29 DIAGNOSIS — D631 Anemia in chronic kidney disease: Secondary | ICD-10-CM | POA: Diagnosis not present

## 2014-08-29 DIAGNOSIS — D688 Other specified coagulation defects: Secondary | ICD-10-CM | POA: Diagnosis not present

## 2014-08-29 DIAGNOSIS — D509 Iron deficiency anemia, unspecified: Secondary | ICD-10-CM | POA: Diagnosis not present

## 2014-08-29 DIAGNOSIS — D689 Coagulation defect, unspecified: Secondary | ICD-10-CM | POA: Diagnosis not present

## 2014-08-29 DIAGNOSIS — N186 End stage renal disease: Secondary | ICD-10-CM | POA: Diagnosis not present

## 2014-08-29 DIAGNOSIS — E119 Type 2 diabetes mellitus without complications: Secondary | ICD-10-CM | POA: Diagnosis not present

## 2014-08-30 ENCOUNTER — Other Ambulatory Visit: Payer: Self-pay | Admitting: Family Medicine

## 2014-08-30 MED ORDER — LEVOFLOXACIN 500 MG PO TABS: 500.0000 mg | ORAL_TABLET | Freq: Every day | ORAL | Status: DC

## 2014-08-31 DIAGNOSIS — D509 Iron deficiency anemia, unspecified: Secondary | ICD-10-CM | POA: Diagnosis not present

## 2014-08-31 DIAGNOSIS — D631 Anemia in chronic kidney disease: Secondary | ICD-10-CM | POA: Diagnosis not present

## 2014-08-31 DIAGNOSIS — D689 Coagulation defect, unspecified: Secondary | ICD-10-CM | POA: Diagnosis not present

## 2014-08-31 DIAGNOSIS — E119 Type 2 diabetes mellitus without complications: Secondary | ICD-10-CM | POA: Diagnosis not present

## 2014-08-31 DIAGNOSIS — D688 Other specified coagulation defects: Secondary | ICD-10-CM | POA: Diagnosis not present

## 2014-08-31 DIAGNOSIS — N186 End stage renal disease: Secondary | ICD-10-CM | POA: Diagnosis not present

## 2014-09-02 DIAGNOSIS — D509 Iron deficiency anemia, unspecified: Secondary | ICD-10-CM | POA: Diagnosis not present

## 2014-09-02 DIAGNOSIS — E119 Type 2 diabetes mellitus without complications: Secondary | ICD-10-CM | POA: Diagnosis not present

## 2014-09-02 DIAGNOSIS — N186 End stage renal disease: Secondary | ICD-10-CM | POA: Diagnosis not present

## 2014-09-02 DIAGNOSIS — D631 Anemia in chronic kidney disease: Secondary | ICD-10-CM | POA: Diagnosis not present

## 2014-09-02 DIAGNOSIS — D688 Other specified coagulation defects: Secondary | ICD-10-CM | POA: Diagnosis not present

## 2014-09-02 DIAGNOSIS — D689 Coagulation defect, unspecified: Secondary | ICD-10-CM | POA: Diagnosis not present

## 2014-09-05 DIAGNOSIS — D631 Anemia in chronic kidney disease: Secondary | ICD-10-CM | POA: Diagnosis not present

## 2014-09-05 DIAGNOSIS — D509 Iron deficiency anemia, unspecified: Secondary | ICD-10-CM | POA: Diagnosis not present

## 2014-09-05 DIAGNOSIS — D688 Other specified coagulation defects: Secondary | ICD-10-CM | POA: Diagnosis not present

## 2014-09-05 DIAGNOSIS — E119 Type 2 diabetes mellitus without complications: Secondary | ICD-10-CM | POA: Diagnosis not present

## 2014-09-05 DIAGNOSIS — D689 Coagulation defect, unspecified: Secondary | ICD-10-CM | POA: Diagnosis not present

## 2014-09-05 DIAGNOSIS — N186 End stage renal disease: Secondary | ICD-10-CM | POA: Diagnosis not present

## 2014-09-06 ENCOUNTER — Ambulatory Visit: Payer: Medicare Other | Admitting: Vascular Surgery

## 2014-09-06 ENCOUNTER — Encounter (HOSPITAL_COMMUNITY): Payer: Medicare Other

## 2014-09-06 ENCOUNTER — Other Ambulatory Visit (HOSPITAL_COMMUNITY): Payer: Medicare Other

## 2014-09-07 DIAGNOSIS — D509 Iron deficiency anemia, unspecified: Secondary | ICD-10-CM | POA: Diagnosis not present

## 2014-09-07 DIAGNOSIS — Z992 Dependence on renal dialysis: Secondary | ICD-10-CM | POA: Diagnosis not present

## 2014-09-07 DIAGNOSIS — D631 Anemia in chronic kidney disease: Secondary | ICD-10-CM | POA: Diagnosis not present

## 2014-09-07 DIAGNOSIS — N186 End stage renal disease: Secondary | ICD-10-CM | POA: Diagnosis not present

## 2014-09-07 DIAGNOSIS — D688 Other specified coagulation defects: Secondary | ICD-10-CM | POA: Diagnosis not present

## 2014-09-07 DIAGNOSIS — E119 Type 2 diabetes mellitus without complications: Secondary | ICD-10-CM | POA: Diagnosis not present

## 2014-09-07 DIAGNOSIS — D689 Coagulation defect, unspecified: Secondary | ICD-10-CM | POA: Diagnosis not present

## 2014-09-09 DIAGNOSIS — L299 Pruritus, unspecified: Secondary | ICD-10-CM | POA: Diagnosis not present

## 2014-09-09 DIAGNOSIS — E119 Type 2 diabetes mellitus without complications: Secondary | ICD-10-CM | POA: Diagnosis not present

## 2014-09-09 DIAGNOSIS — D689 Coagulation defect, unspecified: Secondary | ICD-10-CM | POA: Diagnosis not present

## 2014-09-09 DIAGNOSIS — N186 End stage renal disease: Secondary | ICD-10-CM | POA: Diagnosis not present

## 2014-09-09 DIAGNOSIS — R52 Pain, unspecified: Secondary | ICD-10-CM | POA: Diagnosis not present

## 2014-09-09 DIAGNOSIS — D631 Anemia in chronic kidney disease: Secondary | ICD-10-CM | POA: Diagnosis not present

## 2014-09-09 DIAGNOSIS — Z23 Encounter for immunization: Secondary | ICD-10-CM | POA: Diagnosis not present

## 2014-09-09 DIAGNOSIS — D688 Other specified coagulation defects: Secondary | ICD-10-CM | POA: Diagnosis not present

## 2014-09-09 DIAGNOSIS — D509 Iron deficiency anemia, unspecified: Secondary | ICD-10-CM | POA: Diagnosis not present

## 2014-09-12 DIAGNOSIS — Z23 Encounter for immunization: Secondary | ICD-10-CM | POA: Diagnosis not present

## 2014-09-12 DIAGNOSIS — N186 End stage renal disease: Secondary | ICD-10-CM | POA: Diagnosis not present

## 2014-09-12 DIAGNOSIS — L299 Pruritus, unspecified: Secondary | ICD-10-CM | POA: Diagnosis not present

## 2014-09-12 DIAGNOSIS — D688 Other specified coagulation defects: Secondary | ICD-10-CM | POA: Diagnosis not present

## 2014-09-12 DIAGNOSIS — E119 Type 2 diabetes mellitus without complications: Secondary | ICD-10-CM | POA: Diagnosis not present

## 2014-09-12 DIAGNOSIS — D509 Iron deficiency anemia, unspecified: Secondary | ICD-10-CM | POA: Diagnosis not present

## 2014-09-12 DIAGNOSIS — D689 Coagulation defect, unspecified: Secondary | ICD-10-CM | POA: Diagnosis not present

## 2014-09-12 DIAGNOSIS — D631 Anemia in chronic kidney disease: Secondary | ICD-10-CM | POA: Diagnosis not present

## 2014-09-12 DIAGNOSIS — R52 Pain, unspecified: Secondary | ICD-10-CM | POA: Diagnosis not present

## 2014-09-13 ENCOUNTER — Ambulatory Visit (INDEPENDENT_AMBULATORY_CARE_PROVIDER_SITE_OTHER): Payer: Medicare Other | Admitting: Family Medicine

## 2014-09-13 ENCOUNTER — Other Ambulatory Visit (HOSPITAL_COMMUNITY): Payer: Self-pay

## 2014-09-13 ENCOUNTER — Encounter (HOSPITAL_COMMUNITY): Payer: Medicare Other

## 2014-09-13 ENCOUNTER — Ambulatory Visit: Payer: Self-pay | Admitting: Vascular Surgery

## 2014-09-13 ENCOUNTER — Ambulatory Visit: Payer: Medicare Other | Admitting: Family

## 2014-09-13 ENCOUNTER — Other Ambulatory Visit (HOSPITAL_COMMUNITY): Payer: Medicare Other

## 2014-09-13 ENCOUNTER — Encounter: Payer: Self-pay | Admitting: Family Medicine

## 2014-09-13 VITALS — BP 136/66 | HR 89 | Temp 98.3°F | Ht 71.0 in | Wt 192.0 lb

## 2014-09-13 DIAGNOSIS — N189 Chronic kidney disease, unspecified: Secondary | ICD-10-CM

## 2014-09-13 DIAGNOSIS — Z7189 Other specified counseling: Secondary | ICD-10-CM | POA: Diagnosis not present

## 2014-09-13 DIAGNOSIS — E1122 Type 2 diabetes mellitus with diabetic chronic kidney disease: Secondary | ICD-10-CM

## 2014-09-13 DIAGNOSIS — G4733 Obstructive sleep apnea (adult) (pediatric): Secondary | ICD-10-CM | POA: Diagnosis not present

## 2014-09-13 DIAGNOSIS — G959 Disease of spinal cord, unspecified: Secondary | ICD-10-CM

## 2014-09-13 DIAGNOSIS — M25562 Pain in left knee: Secondary | ICD-10-CM | POA: Diagnosis not present

## 2014-09-13 DIAGNOSIS — G8929 Other chronic pain: Secondary | ICD-10-CM

## 2014-09-13 MED ORDER — OXYCODONE-ACETAMINOPHEN 7.5-325 MG PO TABS: ORAL_TABLET | ORAL | Status: DC

## 2014-09-14 DIAGNOSIS — D689 Coagulation defect, unspecified: Secondary | ICD-10-CM | POA: Diagnosis not present

## 2014-09-14 DIAGNOSIS — E119 Type 2 diabetes mellitus without complications: Secondary | ICD-10-CM | POA: Diagnosis not present

## 2014-09-14 DIAGNOSIS — R52 Pain, unspecified: Secondary | ICD-10-CM | POA: Diagnosis not present

## 2014-09-14 DIAGNOSIS — D509 Iron deficiency anemia, unspecified: Secondary | ICD-10-CM | POA: Diagnosis not present

## 2014-09-14 DIAGNOSIS — N186 End stage renal disease: Secondary | ICD-10-CM | POA: Diagnosis not present

## 2014-09-14 DIAGNOSIS — D631 Anemia in chronic kidney disease: Secondary | ICD-10-CM | POA: Diagnosis not present

## 2014-09-14 DIAGNOSIS — L299 Pruritus, unspecified: Secondary | ICD-10-CM | POA: Diagnosis not present

## 2014-09-14 DIAGNOSIS — Z23 Encounter for immunization: Secondary | ICD-10-CM | POA: Diagnosis not present

## 2014-09-14 DIAGNOSIS — D688 Other specified coagulation defects: Secondary | ICD-10-CM | POA: Diagnosis not present

## 2014-09-14 NOTE — Progress Notes (Signed)
   Subjective:    Patient ID: John [name], male    DOB: Mar 24, 1956, 59 y.o.   MRN: [id number]  HPI Follow-up fall. 09/09/2014. Was standing washing dishes when he started to feel little weak in the legs, before he could grab a chair he fell backwards hitting his but and his head. No loss of consciousness. Has a lump on his head but has not really had any headaches since the day after the fall. No dizziness. Has some sore muscles in his buttock area. Regarding falls, he said he has had multiple episodes. He'll be doing his normal activities of daily living with his legs will just give out. Usually has no or little warning. It is not associated with dizziness, no chest pain. It's like his legs is quit working.  PERTINENT  PMH / PSH: I have reviewed the patient's medications, allergies, past medical and surgical history. Pertinent findings that relate to today's visit / issues include: History of CVA and history of ACDF cervical for cervical canal stenosis and bulging disc. MRI in March 2015 report:: Abnormal MRI scan of cervical spine showing stable postoperative changes of ACDF from C3-C5 with mild spondylitic changes at C5-6 and C6-7 with mild canal and bilateral foraminal narrowing but without significant compression. Overall no significant change compared with MRI scan dated 04/28/2012.   Review of Systems He is continued to have his chronic arthralgias but other than the buttock pain and a knot on his head he's not had any residual pain or problem from his fall area he is usually using his walker to ambulate. He has a lot of trouble getting up from a seated position secondary to left knee pain and weakness. He's had no unusual weight change, no fever, sweats, chills. He continues on dialysis without issue.    Objective:   Physical Exam  Vital signs are reviewed GENERAL: No acute distress MSK: He has a lot of difficulty rising from a chair. Most of that issue seems to be from left knee pain and  impairs both phase I and phase II of sit to stand. Once he obtains face 3 then he's relatively stable although his balance is not particularly steady and he benefits from walker. KNEE: Left. Significant amount of crepitus on extension. He has some stiffness on extension but full range of motion in flexion and extension. There is no swelling or erythema noted of the joint. Knee extension and flexion 4-5 out of 5 bilaterally. HIPS: 4 out of 5 hip flexion strength bilaterally. Ankle/foot: Dorsiflexion and plantarflexion somewhat stiff and 4 out of 5 on the right, 5 out of 5 on the left. NEURO: He has intact sensation to deep touch throughout the lower extremities. He has loss of sensation to soft touch in the intermittent pattern bilateral mid shin down. He also has some loss of proprioception acutely on the right foot. He is alert and oriented 4.      Assessment & Plan:

## 2014-09-14 NOTE — Assessment & Plan Note (Signed)
I think his falls over a result of his previous CVA as well as his cervical canal stenosis. He has had ACDF but upon review of his MRI from last year is quite evident that he has very very little room area I suspect if he has long periods of standing or if he does a lot of neck flexion that he can have some impact on lower extremity strength. We discussed this at length spinning currently 2% of our 35 minute office visit in education and counseling. We discussed ways to be more safe in the house. I do think he should continue to use his walker. He should not plan to stand up for any extended periods of time particularly late in the day. He should be careful of activities were he has to stand with his neck flexed  i.e. Washing dishes.

## 2014-09-14 NOTE — Assessment & Plan Note (Signed)
Filled out form for new glucometer and supplies E11.29 tests tid [facility] fax [phone]

## 2014-09-14 NOTE — Assessment & Plan Note (Signed)
Refilled his medicines for 2 months appropriate dated prescriptions given.

## 2014-09-14 NOTE — Assessment & Plan Note (Signed)
He is having a lot of popping and pain when he attempts to rise from a chair he also has some weakness in his hip flexors on the left side particularly. She's not clear to me that there is any intervention that would improve this other than very careful home exercise program which we discussed to build up his hip muscles.

## 2014-09-16 DIAGNOSIS — D688 Other specified coagulation defects: Secondary | ICD-10-CM | POA: Diagnosis not present

## 2014-09-16 DIAGNOSIS — R52 Pain, unspecified: Secondary | ICD-10-CM | POA: Diagnosis not present

## 2014-09-16 DIAGNOSIS — Z23 Encounter for immunization: Secondary | ICD-10-CM | POA: Diagnosis not present

## 2014-09-16 DIAGNOSIS — N186 End stage renal disease: Secondary | ICD-10-CM | POA: Diagnosis not present

## 2014-09-16 DIAGNOSIS — D631 Anemia in chronic kidney disease: Secondary | ICD-10-CM | POA: Diagnosis not present

## 2014-09-16 DIAGNOSIS — D689 Coagulation defect, unspecified: Secondary | ICD-10-CM | POA: Diagnosis not present

## 2014-09-16 DIAGNOSIS — L299 Pruritus, unspecified: Secondary | ICD-10-CM | POA: Diagnosis not present

## 2014-09-16 DIAGNOSIS — D509 Iron deficiency anemia, unspecified: Secondary | ICD-10-CM | POA: Diagnosis not present

## 2014-09-16 DIAGNOSIS — E119 Type 2 diabetes mellitus without complications: Secondary | ICD-10-CM | POA: Diagnosis not present

## 2014-09-19 DIAGNOSIS — D688 Other specified coagulation defects: Secondary | ICD-10-CM | POA: Diagnosis not present

## 2014-09-19 DIAGNOSIS — L299 Pruritus, unspecified: Secondary | ICD-10-CM | POA: Diagnosis not present

## 2014-09-19 DIAGNOSIS — Z23 Encounter for immunization: Secondary | ICD-10-CM | POA: Diagnosis not present

## 2014-09-19 DIAGNOSIS — D509 Iron deficiency anemia, unspecified: Secondary | ICD-10-CM | POA: Diagnosis not present

## 2014-09-19 DIAGNOSIS — N186 End stage renal disease: Secondary | ICD-10-CM | POA: Diagnosis not present

## 2014-09-19 DIAGNOSIS — R52 Pain, unspecified: Secondary | ICD-10-CM | POA: Diagnosis not present

## 2014-09-19 DIAGNOSIS — E119 Type 2 diabetes mellitus without complications: Secondary | ICD-10-CM | POA: Diagnosis not present

## 2014-09-19 DIAGNOSIS — D631 Anemia in chronic kidney disease: Secondary | ICD-10-CM | POA: Diagnosis not present

## 2014-09-19 DIAGNOSIS — D689 Coagulation defect, unspecified: Secondary | ICD-10-CM | POA: Diagnosis not present

## 2014-09-21 ENCOUNTER — Other Ambulatory Visit (HOSPITAL_COMMUNITY): Payer: Medicare Other

## 2014-09-21 ENCOUNTER — Ambulatory Visit: Payer: Medicare Other | Admitting: Family

## 2014-09-21 DIAGNOSIS — Z23 Encounter for immunization: Secondary | ICD-10-CM | POA: Diagnosis not present

## 2014-09-21 DIAGNOSIS — E119 Type 2 diabetes mellitus without complications: Secondary | ICD-10-CM | POA: Diagnosis not present

## 2014-09-21 DIAGNOSIS — D509 Iron deficiency anemia, unspecified: Secondary | ICD-10-CM | POA: Diagnosis not present

## 2014-09-21 DIAGNOSIS — R52 Pain, unspecified: Secondary | ICD-10-CM | POA: Diagnosis not present

## 2014-09-21 DIAGNOSIS — N186 End stage renal disease: Secondary | ICD-10-CM | POA: Diagnosis not present

## 2014-09-21 DIAGNOSIS — D631 Anemia in chronic kidney disease: Secondary | ICD-10-CM | POA: Diagnosis not present

## 2014-09-21 DIAGNOSIS — D688 Other specified coagulation defects: Secondary | ICD-10-CM | POA: Diagnosis not present

## 2014-09-21 DIAGNOSIS — L299 Pruritus, unspecified: Secondary | ICD-10-CM | POA: Diagnosis not present

## 2014-09-21 DIAGNOSIS — D689 Coagulation defect, unspecified: Secondary | ICD-10-CM | POA: Diagnosis not present

## 2014-09-23 DIAGNOSIS — D631 Anemia in chronic kidney disease: Secondary | ICD-10-CM | POA: Diagnosis not present

## 2014-09-23 DIAGNOSIS — L299 Pruritus, unspecified: Secondary | ICD-10-CM | POA: Diagnosis not present

## 2014-09-23 DIAGNOSIS — D509 Iron deficiency anemia, unspecified: Secondary | ICD-10-CM | POA: Diagnosis not present

## 2014-09-23 DIAGNOSIS — D688 Other specified coagulation defects: Secondary | ICD-10-CM | POA: Diagnosis not present

## 2014-09-23 DIAGNOSIS — D689 Coagulation defect, unspecified: Secondary | ICD-10-CM | POA: Diagnosis not present

## 2014-09-23 DIAGNOSIS — R52 Pain, unspecified: Secondary | ICD-10-CM | POA: Diagnosis not present

## 2014-09-23 DIAGNOSIS — Z23 Encounter for immunization: Secondary | ICD-10-CM | POA: Diagnosis not present

## 2014-09-23 DIAGNOSIS — N186 End stage renal disease: Secondary | ICD-10-CM | POA: Diagnosis not present

## 2014-09-23 DIAGNOSIS — E119 Type 2 diabetes mellitus without complications: Secondary | ICD-10-CM | POA: Diagnosis not present

## 2014-09-26 ENCOUNTER — Encounter: Payer: Self-pay | Admitting: Vascular Surgery

## 2014-09-26 DIAGNOSIS — Z23 Encounter for immunization: Secondary | ICD-10-CM | POA: Diagnosis not present

## 2014-09-26 DIAGNOSIS — D509 Iron deficiency anemia, unspecified: Secondary | ICD-10-CM | POA: Diagnosis not present

## 2014-09-26 DIAGNOSIS — N186 End stage renal disease: Secondary | ICD-10-CM | POA: Diagnosis not present

## 2014-09-26 DIAGNOSIS — D688 Other specified coagulation defects: Secondary | ICD-10-CM | POA: Diagnosis not present

## 2014-09-26 DIAGNOSIS — L299 Pruritus, unspecified: Secondary | ICD-10-CM | POA: Diagnosis not present

## 2014-09-26 DIAGNOSIS — D631 Anemia in chronic kidney disease: Secondary | ICD-10-CM | POA: Diagnosis not present

## 2014-09-26 DIAGNOSIS — E119 Type 2 diabetes mellitus without complications: Secondary | ICD-10-CM | POA: Diagnosis not present

## 2014-09-26 DIAGNOSIS — R52 Pain, unspecified: Secondary | ICD-10-CM | POA: Diagnosis not present

## 2014-09-26 DIAGNOSIS — D689 Coagulation defect, unspecified: Secondary | ICD-10-CM | POA: Diagnosis not present

## 2014-09-27 ENCOUNTER — Encounter (HOSPITAL_COMMUNITY): Payer: Medicare Other

## 2014-09-27 ENCOUNTER — Other Ambulatory Visit (HOSPITAL_COMMUNITY): Payer: Self-pay

## 2014-09-27 ENCOUNTER — Ambulatory Visit: Payer: Self-pay | Admitting: Vascular Surgery

## 2014-09-27 ENCOUNTER — Other Ambulatory Visit (HOSPITAL_COMMUNITY): Payer: Medicare Other

## 2014-09-27 DIAGNOSIS — M238X2 Other internal derangements of left knee: Secondary | ICD-10-CM | POA: Diagnosis not present

## 2014-09-28 DIAGNOSIS — R52 Pain, unspecified: Secondary | ICD-10-CM | POA: Diagnosis not present

## 2014-09-28 DIAGNOSIS — N186 End stage renal disease: Secondary | ICD-10-CM | POA: Diagnosis not present

## 2014-09-28 DIAGNOSIS — D631 Anemia in chronic kidney disease: Secondary | ICD-10-CM | POA: Diagnosis not present

## 2014-09-28 DIAGNOSIS — D509 Iron deficiency anemia, unspecified: Secondary | ICD-10-CM | POA: Diagnosis not present

## 2014-09-28 DIAGNOSIS — D688 Other specified coagulation defects: Secondary | ICD-10-CM | POA: Diagnosis not present

## 2014-09-28 DIAGNOSIS — L299 Pruritus, unspecified: Secondary | ICD-10-CM | POA: Diagnosis not present

## 2014-09-28 DIAGNOSIS — Z23 Encounter for immunization: Secondary | ICD-10-CM | POA: Diagnosis not present

## 2014-09-28 DIAGNOSIS — D689 Coagulation defect, unspecified: Secondary | ICD-10-CM | POA: Diagnosis not present

## 2014-09-28 DIAGNOSIS — E119 Type 2 diabetes mellitus without complications: Secondary | ICD-10-CM | POA: Diagnosis not present

## 2014-09-29 NOTE — Progress Notes (Signed)
Test TID Insulin requiring Faxing order to [facility]  [phone]

## 2014-09-30 DIAGNOSIS — N186 End stage renal disease: Secondary | ICD-10-CM | POA: Diagnosis not present

## 2014-09-30 DIAGNOSIS — D631 Anemia in chronic kidney disease: Secondary | ICD-10-CM | POA: Diagnosis not present

## 2014-09-30 DIAGNOSIS — D509 Iron deficiency anemia, unspecified: Secondary | ICD-10-CM | POA: Diagnosis not present

## 2014-09-30 DIAGNOSIS — Z23 Encounter for immunization: Secondary | ICD-10-CM | POA: Diagnosis not present

## 2014-09-30 DIAGNOSIS — L299 Pruritus, unspecified: Secondary | ICD-10-CM | POA: Diagnosis not present

## 2014-09-30 DIAGNOSIS — D688 Other specified coagulation defects: Secondary | ICD-10-CM | POA: Diagnosis not present

## 2014-09-30 DIAGNOSIS — D689 Coagulation defect, unspecified: Secondary | ICD-10-CM | POA: Diagnosis not present

## 2014-09-30 DIAGNOSIS — R52 Pain, unspecified: Secondary | ICD-10-CM | POA: Diagnosis not present

## 2014-09-30 DIAGNOSIS — E119 Type 2 diabetes mellitus without complications: Secondary | ICD-10-CM | POA: Diagnosis not present

## 2014-10-02 DIAGNOSIS — E1129 Type 2 diabetes mellitus with other diabetic kidney complication: Secondary | ICD-10-CM | POA: Diagnosis not present

## 2014-10-03 DIAGNOSIS — L299 Pruritus, unspecified: Secondary | ICD-10-CM | POA: Diagnosis not present

## 2014-10-03 DIAGNOSIS — D631 Anemia in chronic kidney disease: Secondary | ICD-10-CM | POA: Diagnosis not present

## 2014-10-03 DIAGNOSIS — N186 End stage renal disease: Secondary | ICD-10-CM | POA: Diagnosis not present

## 2014-10-03 DIAGNOSIS — D689 Coagulation defect, unspecified: Secondary | ICD-10-CM | POA: Diagnosis not present

## 2014-10-03 DIAGNOSIS — R52 Pain, unspecified: Secondary | ICD-10-CM | POA: Diagnosis not present

## 2014-10-03 DIAGNOSIS — E119 Type 2 diabetes mellitus without complications: Secondary | ICD-10-CM | POA: Diagnosis not present

## 2014-10-03 DIAGNOSIS — Z23 Encounter for immunization: Secondary | ICD-10-CM | POA: Diagnosis not present

## 2014-10-03 DIAGNOSIS — D688 Other specified coagulation defects: Secondary | ICD-10-CM | POA: Diagnosis not present

## 2014-10-03 DIAGNOSIS — D509 Iron deficiency anemia, unspecified: Secondary | ICD-10-CM | POA: Diagnosis not present

## 2014-10-05 DIAGNOSIS — L299 Pruritus, unspecified: Secondary | ICD-10-CM | POA: Diagnosis not present

## 2014-10-05 DIAGNOSIS — D631 Anemia in chronic kidney disease: Secondary | ICD-10-CM | POA: Diagnosis not present

## 2014-10-05 DIAGNOSIS — D688 Other specified coagulation defects: Secondary | ICD-10-CM | POA: Diagnosis not present

## 2014-10-05 DIAGNOSIS — N186 End stage renal disease: Secondary | ICD-10-CM | POA: Diagnosis not present

## 2014-10-05 DIAGNOSIS — D689 Coagulation defect, unspecified: Secondary | ICD-10-CM | POA: Diagnosis not present

## 2014-10-05 DIAGNOSIS — R52 Pain, unspecified: Secondary | ICD-10-CM | POA: Diagnosis not present

## 2014-10-05 DIAGNOSIS — D509 Iron deficiency anemia, unspecified: Secondary | ICD-10-CM | POA: Diagnosis not present

## 2014-10-05 DIAGNOSIS — E119 Type 2 diabetes mellitus without complications: Secondary | ICD-10-CM | POA: Diagnosis not present

## 2014-10-05 DIAGNOSIS — Z23 Encounter for immunization: Secondary | ICD-10-CM | POA: Diagnosis not present

## 2014-10-07 DIAGNOSIS — D688 Other specified coagulation defects: Secondary | ICD-10-CM | POA: Diagnosis not present

## 2014-10-07 DIAGNOSIS — E119 Type 2 diabetes mellitus without complications: Secondary | ICD-10-CM | POA: Diagnosis not present

## 2014-10-07 DIAGNOSIS — D631 Anemia in chronic kidney disease: Secondary | ICD-10-CM | POA: Diagnosis not present

## 2014-10-07 DIAGNOSIS — E1129 Type 2 diabetes mellitus with other diabetic kidney complication: Secondary | ICD-10-CM | POA: Diagnosis not present

## 2014-10-07 DIAGNOSIS — D689 Coagulation defect, unspecified: Secondary | ICD-10-CM | POA: Diagnosis not present

## 2014-10-07 DIAGNOSIS — D509 Iron deficiency anemia, unspecified: Secondary | ICD-10-CM | POA: Diagnosis not present

## 2014-10-07 DIAGNOSIS — R52 Pain, unspecified: Secondary | ICD-10-CM | POA: Diagnosis not present

## 2014-10-07 DIAGNOSIS — N186 End stage renal disease: Secondary | ICD-10-CM | POA: Diagnosis not present

## 2014-10-07 DIAGNOSIS — Z23 Encounter for immunization: Secondary | ICD-10-CM | POA: Diagnosis not present

## 2014-10-07 DIAGNOSIS — Z992 Dependence on renal dialysis: Secondary | ICD-10-CM | POA: Diagnosis not present

## 2014-10-07 DIAGNOSIS — L299 Pruritus, unspecified: Secondary | ICD-10-CM | POA: Diagnosis not present

## 2014-10-10 DIAGNOSIS — N186 End stage renal disease: Secondary | ICD-10-CM | POA: Diagnosis not present

## 2014-10-10 DIAGNOSIS — D509 Iron deficiency anemia, unspecified: Secondary | ICD-10-CM | POA: Diagnosis not present

## 2014-10-10 DIAGNOSIS — D688 Other specified coagulation defects: Secondary | ICD-10-CM | POA: Diagnosis not present

## 2014-10-10 DIAGNOSIS — N2581 Secondary hyperparathyroidism of renal origin: Secondary | ICD-10-CM | POA: Diagnosis not present

## 2014-10-10 DIAGNOSIS — D689 Coagulation defect, unspecified: Secondary | ICD-10-CM | POA: Diagnosis not present

## 2014-10-10 DIAGNOSIS — D631 Anemia in chronic kidney disease: Secondary | ICD-10-CM | POA: Diagnosis not present

## 2014-10-10 DIAGNOSIS — E119 Type 2 diabetes mellitus without complications: Secondary | ICD-10-CM | POA: Diagnosis not present

## 2014-10-12 DIAGNOSIS — D509 Iron deficiency anemia, unspecified: Secondary | ICD-10-CM | POA: Diagnosis not present

## 2014-10-12 DIAGNOSIS — N2581 Secondary hyperparathyroidism of renal origin: Secondary | ICD-10-CM | POA: Diagnosis not present

## 2014-10-12 DIAGNOSIS — D631 Anemia in chronic kidney disease: Secondary | ICD-10-CM | POA: Diagnosis not present

## 2014-10-12 DIAGNOSIS — D688 Other specified coagulation defects: Secondary | ICD-10-CM | POA: Diagnosis not present

## 2014-10-12 DIAGNOSIS — E119 Type 2 diabetes mellitus without complications: Secondary | ICD-10-CM | POA: Diagnosis not present

## 2014-10-12 DIAGNOSIS — N186 End stage renal disease: Secondary | ICD-10-CM | POA: Diagnosis not present

## 2014-10-12 DIAGNOSIS — D689 Coagulation defect, unspecified: Secondary | ICD-10-CM | POA: Diagnosis not present

## 2014-10-14 DIAGNOSIS — N2581 Secondary hyperparathyroidism of renal origin: Secondary | ICD-10-CM | POA: Diagnosis not present

## 2014-10-14 DIAGNOSIS — D509 Iron deficiency anemia, unspecified: Secondary | ICD-10-CM | POA: Diagnosis not present

## 2014-10-14 DIAGNOSIS — D688 Other specified coagulation defects: Secondary | ICD-10-CM | POA: Diagnosis not present

## 2014-10-14 DIAGNOSIS — E119 Type 2 diabetes mellitus without complications: Secondary | ICD-10-CM | POA: Diagnosis not present

## 2014-10-14 DIAGNOSIS — D631 Anemia in chronic kidney disease: Secondary | ICD-10-CM | POA: Diagnosis not present

## 2014-10-14 DIAGNOSIS — D689 Coagulation defect, unspecified: Secondary | ICD-10-CM | POA: Diagnosis not present

## 2014-10-14 DIAGNOSIS — N186 End stage renal disease: Secondary | ICD-10-CM | POA: Diagnosis not present

## 2014-10-17 ENCOUNTER — Other Ambulatory Visit: Payer: Self-pay | Admitting: Family Medicine

## 2014-10-17 DIAGNOSIS — N2581 Secondary hyperparathyroidism of renal origin: Secondary | ICD-10-CM | POA: Diagnosis not present

## 2014-10-17 DIAGNOSIS — D688 Other specified coagulation defects: Secondary | ICD-10-CM | POA: Diagnosis not present

## 2014-10-17 DIAGNOSIS — D509 Iron deficiency anemia, unspecified: Secondary | ICD-10-CM | POA: Diagnosis not present

## 2014-10-17 DIAGNOSIS — E119 Type 2 diabetes mellitus without complications: Secondary | ICD-10-CM | POA: Diagnosis not present

## 2014-10-17 DIAGNOSIS — D689 Coagulation defect, unspecified: Secondary | ICD-10-CM | POA: Diagnosis not present

## 2014-10-17 DIAGNOSIS — N186 End stage renal disease: Secondary | ICD-10-CM | POA: Diagnosis not present

## 2014-10-17 DIAGNOSIS — D631 Anemia in chronic kidney disease: Secondary | ICD-10-CM | POA: Diagnosis not present

## 2014-10-18 ENCOUNTER — Encounter: Payer: Self-pay | Admitting: Family Medicine

## 2014-10-18 ENCOUNTER — Ambulatory Visit (INDEPENDENT_AMBULATORY_CARE_PROVIDER_SITE_OTHER): Payer: Medicare Other | Admitting: Family Medicine

## 2014-10-18 VITALS — BP 154/64 | HR 84 | Temp 98.1°F | Ht 71.0 in | Wt 190.0 lb

## 2014-10-18 DIAGNOSIS — N189 Chronic kidney disease, unspecified: Secondary | ICD-10-CM

## 2014-10-18 DIAGNOSIS — Z658 Other specified problems related to psychosocial circumstances: Secondary | ICD-10-CM

## 2014-10-18 DIAGNOSIS — E1122 Type 2 diabetes mellitus with diabetic chronic kidney disease: Secondary | ICD-10-CM

## 2014-10-18 DIAGNOSIS — G8929 Other chronic pain: Secondary | ICD-10-CM

## 2014-10-18 DIAGNOSIS — Z7189 Other specified counseling: Secondary | ICD-10-CM

## 2014-10-18 LAB — POCT GLYCOSYLATED HEMOGLOBIN (HGB A1C): Hemoglobin A1C: 7.2

## 2014-10-18 MED ORDER — OXYCODONE-ACETAMINOPHEN 7.5-325 MG PO TABS: ORAL_TABLET | ORAL | Status: DC

## 2014-10-19 DIAGNOSIS — N2581 Secondary hyperparathyroidism of renal origin: Secondary | ICD-10-CM | POA: Diagnosis not present

## 2014-10-19 DIAGNOSIS — N186 End stage renal disease: Secondary | ICD-10-CM | POA: Diagnosis not present

## 2014-10-19 DIAGNOSIS — D688 Other specified coagulation defects: Secondary | ICD-10-CM | POA: Diagnosis not present

## 2014-10-19 DIAGNOSIS — E119 Type 2 diabetes mellitus without complications: Secondary | ICD-10-CM | POA: Diagnosis not present

## 2014-10-19 DIAGNOSIS — D631 Anemia in chronic kidney disease: Secondary | ICD-10-CM | POA: Diagnosis not present

## 2014-10-19 DIAGNOSIS — D509 Iron deficiency anemia, unspecified: Secondary | ICD-10-CM | POA: Diagnosis not present

## 2014-10-19 DIAGNOSIS — D689 Coagulation defect, unspecified: Secondary | ICD-10-CM | POA: Diagnosis not present

## 2014-10-19 NOTE — Assessment & Plan Note (Signed)
I thought his pain medication was due today so I wrote a prescription printed off however had misread the chart so I destroyed the prescription that I printed from today.

## 2014-10-19 NOTE — Progress Notes (Signed)
   Subjective:    Patient ID: RITA PROM, male    DOB: 1955-11-25, 59 y.o.   MRN: [id number]  HPI Olanda is here today to discuss some stressors in his life. Most of the center around his wife's grandson, Merrily Pew. The legal system is involved.   Review of Systems Some intermittent anxiety related to marital stressors.    Objective:   Physical Exam  Vital signs are reviewed GEN.: Well-developed male no acute distress PSYCH: Alert and oriented 4. Affect is interactive. Speech is normal and fluency in content      Assessment & Plan:

## 2014-10-20 DIAGNOSIS — T82858D Stenosis of vascular prosthetic devices, implants and grafts, subsequent encounter: Secondary | ICD-10-CM | POA: Diagnosis not present

## 2014-10-20 DIAGNOSIS — N186 End stage renal disease: Secondary | ICD-10-CM | POA: Diagnosis not present

## 2014-10-20 DIAGNOSIS — I871 Compression of vein: Secondary | ICD-10-CM | POA: Diagnosis not present

## 2014-10-20 DIAGNOSIS — Z992 Dependence on renal dialysis: Secondary | ICD-10-CM | POA: Diagnosis not present

## 2014-10-21 DIAGNOSIS — E119 Type 2 diabetes mellitus without complications: Secondary | ICD-10-CM | POA: Diagnosis not present

## 2014-10-21 DIAGNOSIS — N2581 Secondary hyperparathyroidism of renal origin: Secondary | ICD-10-CM | POA: Diagnosis not present

## 2014-10-21 DIAGNOSIS — D509 Iron deficiency anemia, unspecified: Secondary | ICD-10-CM | POA: Diagnosis not present

## 2014-10-21 DIAGNOSIS — D631 Anemia in chronic kidney disease: Secondary | ICD-10-CM | POA: Diagnosis not present

## 2014-10-21 DIAGNOSIS — D689 Coagulation defect, unspecified: Secondary | ICD-10-CM | POA: Diagnosis not present

## 2014-10-21 DIAGNOSIS — N186 End stage renal disease: Secondary | ICD-10-CM | POA: Diagnosis not present

## 2014-10-21 DIAGNOSIS — D688 Other specified coagulation defects: Secondary | ICD-10-CM | POA: Diagnosis not present

## 2014-10-24 DIAGNOSIS — N2581 Secondary hyperparathyroidism of renal origin: Secondary | ICD-10-CM | POA: Diagnosis not present

## 2014-10-24 DIAGNOSIS — D509 Iron deficiency anemia, unspecified: Secondary | ICD-10-CM | POA: Diagnosis not present

## 2014-10-24 DIAGNOSIS — E119 Type 2 diabetes mellitus without complications: Secondary | ICD-10-CM | POA: Diagnosis not present

## 2014-10-24 DIAGNOSIS — D631 Anemia in chronic kidney disease: Secondary | ICD-10-CM | POA: Diagnosis not present

## 2014-10-24 DIAGNOSIS — D689 Coagulation defect, unspecified: Secondary | ICD-10-CM | POA: Diagnosis not present

## 2014-10-24 DIAGNOSIS — N186 End stage renal disease: Secondary | ICD-10-CM | POA: Diagnosis not present

## 2014-10-24 DIAGNOSIS — D688 Other specified coagulation defects: Secondary | ICD-10-CM | POA: Diagnosis not present

## 2014-10-26 DIAGNOSIS — D689 Coagulation defect, unspecified: Secondary | ICD-10-CM | POA: Diagnosis not present

## 2014-10-26 DIAGNOSIS — E119 Type 2 diabetes mellitus without complications: Secondary | ICD-10-CM | POA: Diagnosis not present

## 2014-10-26 DIAGNOSIS — D688 Other specified coagulation defects: Secondary | ICD-10-CM | POA: Diagnosis not present

## 2014-10-26 DIAGNOSIS — D509 Iron deficiency anemia, unspecified: Secondary | ICD-10-CM | POA: Diagnosis not present

## 2014-10-26 DIAGNOSIS — N2581 Secondary hyperparathyroidism of renal origin: Secondary | ICD-10-CM | POA: Diagnosis not present

## 2014-10-26 DIAGNOSIS — D631 Anemia in chronic kidney disease: Secondary | ICD-10-CM | POA: Diagnosis not present

## 2014-10-26 DIAGNOSIS — N186 End stage renal disease: Secondary | ICD-10-CM | POA: Diagnosis not present

## 2014-10-27 ENCOUNTER — Other Ambulatory Visit: Payer: Self-pay | Admitting: Family Medicine

## 2014-10-28 DIAGNOSIS — D689 Coagulation defect, unspecified: Secondary | ICD-10-CM | POA: Diagnosis not present

## 2014-10-28 DIAGNOSIS — D688 Other specified coagulation defects: Secondary | ICD-10-CM | POA: Diagnosis not present

## 2014-10-28 DIAGNOSIS — E119 Type 2 diabetes mellitus without complications: Secondary | ICD-10-CM | POA: Diagnosis not present

## 2014-10-28 DIAGNOSIS — D631 Anemia in chronic kidney disease: Secondary | ICD-10-CM | POA: Diagnosis not present

## 2014-10-28 DIAGNOSIS — D509 Iron deficiency anemia, unspecified: Secondary | ICD-10-CM | POA: Diagnosis not present

## 2014-10-28 DIAGNOSIS — N2581 Secondary hyperparathyroidism of renal origin: Secondary | ICD-10-CM | POA: Diagnosis not present

## 2014-10-28 DIAGNOSIS — N186 End stage renal disease: Secondary | ICD-10-CM | POA: Diagnosis not present

## 2014-10-31 DIAGNOSIS — D689 Coagulation defect, unspecified: Secondary | ICD-10-CM | POA: Diagnosis not present

## 2014-10-31 DIAGNOSIS — D509 Iron deficiency anemia, unspecified: Secondary | ICD-10-CM | POA: Diagnosis not present

## 2014-10-31 DIAGNOSIS — N186 End stage renal disease: Secondary | ICD-10-CM | POA: Diagnosis not present

## 2014-10-31 DIAGNOSIS — E119 Type 2 diabetes mellitus without complications: Secondary | ICD-10-CM | POA: Diagnosis not present

## 2014-10-31 DIAGNOSIS — D688 Other specified coagulation defects: Secondary | ICD-10-CM | POA: Diagnosis not present

## 2014-10-31 DIAGNOSIS — D631 Anemia in chronic kidney disease: Secondary | ICD-10-CM | POA: Diagnosis not present

## 2014-10-31 DIAGNOSIS — N2581 Secondary hyperparathyroidism of renal origin: Secondary | ICD-10-CM | POA: Diagnosis not present

## 2014-11-02 DIAGNOSIS — N186 End stage renal disease: Secondary | ICD-10-CM | POA: Diagnosis not present

## 2014-11-02 DIAGNOSIS — D689 Coagulation defect, unspecified: Secondary | ICD-10-CM | POA: Diagnosis not present

## 2014-11-02 DIAGNOSIS — N2581 Secondary hyperparathyroidism of renal origin: Secondary | ICD-10-CM | POA: Diagnosis not present

## 2014-11-02 DIAGNOSIS — D631 Anemia in chronic kidney disease: Secondary | ICD-10-CM | POA: Diagnosis not present

## 2014-11-02 DIAGNOSIS — E119 Type 2 diabetes mellitus without complications: Secondary | ICD-10-CM | POA: Diagnosis not present

## 2014-11-02 DIAGNOSIS — D688 Other specified coagulation defects: Secondary | ICD-10-CM | POA: Diagnosis not present

## 2014-11-02 DIAGNOSIS — D509 Iron deficiency anemia, unspecified: Secondary | ICD-10-CM | POA: Diagnosis not present

## 2014-11-04 DIAGNOSIS — D631 Anemia in chronic kidney disease: Secondary | ICD-10-CM | POA: Diagnosis not present

## 2014-11-04 DIAGNOSIS — D509 Iron deficiency anemia, unspecified: Secondary | ICD-10-CM | POA: Diagnosis not present

## 2014-11-04 DIAGNOSIS — N186 End stage renal disease: Secondary | ICD-10-CM | POA: Diagnosis not present

## 2014-11-04 DIAGNOSIS — E119 Type 2 diabetes mellitus without complications: Secondary | ICD-10-CM | POA: Diagnosis not present

## 2014-11-04 DIAGNOSIS — D689 Coagulation defect, unspecified: Secondary | ICD-10-CM | POA: Diagnosis not present

## 2014-11-04 DIAGNOSIS — D688 Other specified coagulation defects: Secondary | ICD-10-CM | POA: Diagnosis not present

## 2014-11-04 DIAGNOSIS — N2581 Secondary hyperparathyroidism of renal origin: Secondary | ICD-10-CM | POA: Diagnosis not present

## 2014-11-07 ENCOUNTER — Encounter: Payer: Self-pay | Admitting: Vascular Surgery

## 2014-11-07 DIAGNOSIS — E1129 Type 2 diabetes mellitus with other diabetic kidney complication: Secondary | ICD-10-CM | POA: Diagnosis not present

## 2014-11-07 DIAGNOSIS — N186 End stage renal disease: Secondary | ICD-10-CM | POA: Diagnosis not present

## 2014-11-07 DIAGNOSIS — D509 Iron deficiency anemia, unspecified: Secondary | ICD-10-CM | POA: Diagnosis not present

## 2014-11-07 DIAGNOSIS — D631 Anemia in chronic kidney disease: Secondary | ICD-10-CM | POA: Diagnosis not present

## 2014-11-07 DIAGNOSIS — D689 Coagulation defect, unspecified: Secondary | ICD-10-CM | POA: Diagnosis not present

## 2014-11-07 DIAGNOSIS — N2581 Secondary hyperparathyroidism of renal origin: Secondary | ICD-10-CM | POA: Diagnosis not present

## 2014-11-07 DIAGNOSIS — D688 Other specified coagulation defects: Secondary | ICD-10-CM | POA: Diagnosis not present

## 2014-11-07 DIAGNOSIS — E119 Type 2 diabetes mellitus without complications: Secondary | ICD-10-CM | POA: Diagnosis not present

## 2014-11-07 DIAGNOSIS — Z992 Dependence on renal dialysis: Secondary | ICD-10-CM | POA: Diagnosis not present

## 2014-11-08 ENCOUNTER — Ambulatory Visit (HOSPITAL_COMMUNITY)
Admission: RE | Admit: 2014-11-08 | Discharge: 2014-11-08 | Disposition: A | Discharge status: Home / Self Care | Payer: Medicare Other | Source: Ambulatory Visit | Attending: Vascular Surgery | Admitting: Vascular Surgery

## 2014-11-08 ENCOUNTER — Encounter: Payer: Self-pay | Admitting: Vascular Surgery

## 2014-11-08 ENCOUNTER — Ambulatory Visit (INDEPENDENT_AMBULATORY_CARE_PROVIDER_SITE_OTHER)
Admission: RE | Admit: 2014-11-08 | Discharge: 2014-11-08 | Disposition: A | Discharge status: Home / Self Care | Payer: Medicare Other | Source: Ambulatory Visit | Attending: Vascular Surgery | Admitting: Vascular Surgery

## 2014-11-08 ENCOUNTER — Ambulatory Visit (INDEPENDENT_AMBULATORY_CARE_PROVIDER_SITE_OTHER): Payer: Medicare Other | Admitting: Vascular Surgery

## 2014-11-08 VITALS — BP 152/88 | HR 78 | Temp 98.2°F | Resp 18 | Ht 71.0 in | Wt 190.0 lb

## 2014-11-08 DIAGNOSIS — I6523 Occlusion and stenosis of bilateral carotid arteries: Secondary | ICD-10-CM | POA: Diagnosis not present

## 2014-11-08 DIAGNOSIS — I739 Peripheral vascular disease, unspecified: Secondary | ICD-10-CM

## 2014-11-08 DIAGNOSIS — Z48812 Encounter for surgical aftercare following surgery on the circulatory system: Secondary | ICD-10-CM | POA: Diagnosis not present

## 2014-11-08 NOTE — Progress Notes (Signed)
HISTORY AND PHYSICAL     CC:  Follow up Referring Provider:  [name], MD  HPI: This is a 59 y.o. male who is a known pt to Dr. 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  He states that he has not had any stroke like symptoms such as amaurosis fugax, hemiparesis or speech difficulty.  He states that he does have numbness over this CEA scar.  He states that he does have numbness and coldness of his left hand on the dialysis machine.  He states that he does not have any pain.  He states that his fingers on the left hand have changed color.  He denies any sores to the hand.  He has been on HD since August and this has been that way since then.  He was told he has Carpal Tunnel Syndrome.  He states that he has had 2 procedures on the fistula to "open the vein" at the nephrology outpatient center.  He does have left leg pain and states that he feels as if he has a glove on the left foot.  He states that if he walks a lot, he has to stop and rest as he gets pain in his calves.  He denies any sores that won't heal.  He states that his toe nails are difficult to trim and inquires about toe nail removal.  He states that his right leg is doing well, but he does have tenderness over the toe amputation sites of the right foot.  He does not have worsening pain with lying flat.    He does continue to smoke and states a pack will last him longer than a week.  He has dramatically cut back as he used to smoke 2-3 packs per day.    He is on a statin for his cholesterol.  He is on insulin for his diabetes.    Past Medical History  Diagnosis Date  . COPD (chronic obstructive pulmonary disease)   . Chronic back pain     Chronic narcotics for this for many years  . Diabetes mellitus   . Hyperlipidemia   . Hypertension   . Foot pain   . Peripheral  vascular disease   . Asthma   . Stroke     No residual "Mini strokes"  . Ulcer 1985  . Fracture of foot     [place]- Right  . Neuromuscular disorder     periferal neuropathy  . Chronic kidney disease     CKD stage III    Past Surgical History  Procedure Laterality Date  . Neck surgery    . Eye surgery    . Cardiac catheterization    . Endarterectomy  12/18/2011    Procedure: ENDARTERECTOMY CAROTID;  Surgeon: Angelia Mould, MD;  Location: [place];  Service: Vascular;  Laterality: Left;  . Carotid endarterectomy    . Axillary-femoral bypass graft  06/18/2012    Procedure: BYPASS GRAFT AXILLA-BIFEMORAL;  Surgeon: Angelia Mould, MD;  Location: [place];  Service: Vascular;  Laterality: Right;  Right Axillo to Right Femoral Bypass Graft  . Femoral-popliteal bypass graft  06/18/2012    Procedure: BYPASS GRAFT FEMORAL-POPLITEAL ARTERY;  Surgeon: Angelia Mould, MD;  Location: [facility] OR;  Service: Vascular;  Laterality: Right;  Right Femoral to Above Knee Popliteal Bypass Graft  .  Amputation  06/18/2012    Procedure: AMPUTATION DIGIT;  Surgeon: Angelia Mould, MD;  Location: [facility] OR;  Service: Vascular;  Laterality: Right;  Right side, amputation of fourth and fifth toes  . Av fistula placement  06/24/2012    Procedure: ARTERIOVENOUS (AV) FISTULA CREATION;  Surgeon: Angelia Mould, MD;  Location: [place];  Service: Vascular;  Laterality: Left;  . Lower extremity angiogram Right 11/21/2011    Procedure: LOWER EXTREMITY ANGIOGRAM;  Surgeon: Elam Dutch, MD;  Location: [facility] CATH LAB;  Service: Cardiovascular;  Laterality: Right;    Allergies  Allergen Reactions  . Nicoderm [Nicotine] Itching and Rash  . Gabapentin Other (See Comments)    Can't walk, shuts legs down  . Vicodin [Hydrocodone-Acetaminophen] Hives and Itching    Current Outpatient Prescriptions  Medication Sig Dispense Refill  . erythromycin ophthalmic ointment     . ethyl chloride spray     .  insulin lispro (HUMALOG) 100 UNIT/ML injection Inject 5 Units into the skin 2 (two) times daily before lunch and supper. Per sliding scale.    Marland Kitchen LANTUS 100 UNIT/ML injection INJECT 6 UNITS INTO THE SKIN DAILY 30 mL 0  . lidocaine-EPINEPHrine-tetracaine (LET) GEL Apply topically to left arm 1 hour before dialysis 1 Syringe 2  . multivitamin (RENA-VIT) TABS tablet     . oxyCODONE-acetaminophen (PERCOCET) 7.5-325 MG per tablet Take one by mouth four times a day as needed for pain do not fill before November 13 2014 120 tablet 0  . ALPRAZolam (XANAX) 0.5 MG tablet Take 1-2 pills on call to MRI for claustrophobia, may take a third pill if needed. (Patient not taking: Reported on 11/08/2014) 3 tablet 0  . aspirin 81 MG chewable tablet Chew 81 mg by mouth daily.    . B Complex-C-Folic Acid (DIALYVITE TABLET) TABS     . baclofen (LIORESAL) 10 MG tablet TAKE 1 TABLET BY MOUTH TWICE DAILY (Patient not taking: Reported on 11/08/2014) 60 tablet 0  . benzonatate (TESSALON) 200 MG capsule Take 1 capsule (200 mg total) by mouth 3 (three) times daily as needed for cough. (Patient not taking: Reported on 11/08/2014) 20 capsule 0  . betamethasone valerate ointment (VALISONE) 0.1 % Apply topically 2 (two) times daily. (Patient not taking: Reported on 11/08/2014) 30 g 1  . cephALEXin (KEFLEX) 500 MG capsule Take 1 capsule (500 mg total) by mouth 3 (three) times daily. (Patient not taking: Reported on 11/08/2014) 30 capsule 0  . darbepoetin (ARANESP) 100 MCG/0.5ML SOLN Inject 0.5 mLs (100 mcg total) into the skin every Monday. (Patient not taking: Reported on 11/08/2014) 4.2 mL 0  . docusate sodium (COLACE) 250 MG capsule Take 1 capsule (250 mg total) by mouth 2 (two) times daily. (Patient not taking: Reported on 11/08/2014) 60 capsule 5  . levofloxacin (LEVAQUIN) 500 MG tablet Take 1 tablet (500 mg total) by mouth daily. (Patient not taking: Reported on 11/08/2014) 7 tablet 0  . LORazepam (ATIVAN) 0.5 MG tablet Take 2 tablets (1 mg total) by  mouth 3 (three) times daily as needed for anxiety. (Patient not taking: Reported on 11/08/2014) 15 tablet 0  . losartan-hydrochlorothiazide (HYZAAR) 100-12.5 MG per tablet     . metoprolol succinate (TOPROL-XL) 25 MG 24 hr tablet TAKE 1 TABLET BY MOUTH EVERY DAY (Patient not taking: Reported on 11/08/2014) 90 tablet 3  . ondansetron (ZOFRAN) 4 MG tablet Take 1 tablet (4 mg total) by mouth every 8 (eight) hours as needed for nausea. (Patient not taking: Reported  on 11/08/2014) 20 tablet 0  . oseltamivir (TAMIFLU) 30 MG capsule Take 1 capsule (30 mg total) by mouth daily. (Patient not taking: Reported on 11/08/2014) 5 capsule 0  . pravastatin (PRAVACHOL) 40 MG tablet TAKE 1 TABLET BY MOUTH EVERY DAY (Patient not taking: Reported on 11/08/2014) 90 tablet 3  . PROAIR HFA 108 (90 BASE) MCG/ACT inhaler INHALE 2 PUFFS BY MOUTH EVERY 4 HOURS AS NEEDED FOR WHEEZING OR SHORTNESS OF BREATH (Patient not taking: Reported on 11/08/2014) 8.5 g 0  . sodium polystyrene (KAYEXALATE) 15 GM/60ML suspension Take 60 mLs (15 g total) by mouth See admin instructions. Takes every 3-4 days (Patient not taking: Reported on 11/08/2014) 500 mL 0  . Spacer/Aero-Holding Chambers (BREATHERITE COLL SPACER ADULT) MISC 1 Device by Does not apply route once. (Patient not taking: Reported on 11/08/2014) 1 each 0  . sulfamethoxazole-trimethoprim (BACTRIM DS) 800-160 MG per tablet Take 1 tablet by mouth 2 (two) times daily. (Patient not taking: Reported on 11/08/2014) 20 tablet 1  . tamsulosin (FLOMAX) 0.4 MG CAPS capsule Take 0.4 mg by mouth daily.    Marland Kitchen triamcinolone cream (KENALOG) 0.1 % Apply topically 2 (two) times daily. Apply to affected areas as directed patient has written instructions (Patient not taking: Reported on 11/08/2014) 454 g 3  . varenicline (CHANTIX) 1 MG tablet Take 1 tablet (1 mg total) by mouth 2 (two) times daily. (Patient not taking: Reported on 11/08/2014) 60 tablet 5  . vitamin C (ASCORBIC ACID) 500 MG tablet Take 500 mg by mouth daily.     . [DISCONTINUED] tiotropium (SPIRIVA HANDIHALER) 18 MCG inhalation capsule Place 1 capsule (18 mcg total) into inhaler and inhale daily. 30 capsule 2   No current facility-administered medications for this visit.   Facility-Administered Medications Ordered in Other Visits  Medication Dose Route Frequency Provider Last Rate Last Dose  . ferumoxytol (FERAHEME) 1,020 mg in sodium chloride 0.9 % 100 mL IVPB  1,020 mg Intravenous Once Corliss Parish, MD        Family History  Problem Relation Age of Onset  . Diabetes Mother   . Heart disease Mother   . Hypertension Mother   . Heart attack Mother   . Diabetes Father     Amputation  . Heart disease Father     Before age 29  . Hypertension Father   . Heart attack Father   . Other Father     bleeding problems  . Diabetes Brother   . Heart disease Brother     Before age 46  . Hypertension Brother   . Heart attack Brother     History   Social History  . Marital Status: Married    Spouse Name: [name]  . Number of Children: 4  . Years of Education: 11   Occupational History  . 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 Smoking status: Current Every Day Smoker -- 0.25 packs/day for 40 years    Types: Cigarettes    Last Attempt to Quit: 01/30/2014  . Smokeless tobacco: Never Used     Comment: 2-3 cigarettes a day  . Alcohol Use: No     Comment: Previous drinker - However, quit in 2006  . Drug Use: No  . Sexual Activity: No   Other Topics Concern  . Not on file   Social History Narrative   Health Care POA:    Emergency Contact: wife, Geovanny Sartin [phone]   End of Life Plan: gave patient AD pamphlet.  Who lives with you: wife   Any pets: 1 dog   Diet: Pt has a varied diet of protein, starch and vegetables.   Exercise: Pt reports walking around yard.    Seatbelts: Pt reports wearing seatbelt when in vehicles.    Hobbies: fishing                 ROS: [x]  Positive   [ ]  Negative   [ ]  All sytems  reviewed and are negative  Cardiovascular: []  chest pain/pressure []  palpitations []  SOB lying flat []  DOE [x]  pain in legs while walking [x]  pain in feet when lying flat []  hx of DVT []  hx of phlebitis []  swelling in legs []  varicose veins  Pulmonary: []  productive cough [x]  asthma []  wheezing  Neurologic: []  weakness in []  arms []  legs [x]  numbness/coldness in [x]  left hand on HD []  legs [] difficulty speaking or slurred speech []  temporary loss of vision in one eye []  dizziness  Hematologic: []  bleeding problems []  problems with blood clotting easily  GI []  vomiting blood []  blood in stool  GU: []  burning with urination []  blood in urine  Psychiatric: []  hx of major depression  Integumentary: []  rashes []  ulcers  Constitutional: []  fever []  chills   PHYSICAL EXAMINATION:  Filed Vitals:   11/08/14 1515  BP: 152/88  Pulse: 78  Temp: 98.2 F (36.8 C)  Resp: 18   Body mass index is 26.51 kg/(m^2).  General:  WDWN in NAD Gait: Not observed HENT: WNL, normocephalic; well healed left CEA scar Pulmonary: normal non-labored breathing , without Rales, rhonchi,  wheezing Cardiac: RRR, without  Murmurs, rubs or gallops; with  Right carotid bruit Abdomen: soft, NT, no masses Skin: without rashes, without ulcers  Vascular Exam/Pulses:  Right Left  Radial 2+ (normal) Unable to palpate   Ulnar Unable to palpate  Unable to palpate   Femoral 2+ (normal) 2+ (normal)  Popliteal Unable to palpate  Unable to palpate   DP Unable to palpate  Unable to palpate   PT Unable to palpate  Unable to palpate    Extremities: darkening of the toes on the left foot as well as skin color changes (darkening) of the fingers on the left hand, without Gangrene , without cellulitis; without open wounds; +thrill within the bruit; pulsatile more proximal. + palpable ax/fem graft pulse Musculoskeletal: no muscle wasting or atrophy  Neurologic: A&O X 3; Appropriate Affect  ; SENSATION: normal; MOTOR FUNCTION:  moving all extremities equally. Speech is fluent/normal   Non-Invasive Vascular Imaging:   Lower extremity arterial duplex (right) 11/08/14: 1.  Patent right femoral to popliteal artery bypass graft.  Minimally elevated velocities at the proximal anastomosis 2.  Dense calcific walls present involving the calf arteries known non compressibility  3.  No significant change since last exam 03/08/14.  Caroid duplex 11/08/14: Right ICA velocities suggest a 1-49% stenosis Patent left CEA site with no evidence of hyperplasia or restenosis  Pt meds includes: Statin:  Yes.   Beta Blocker:  Yes.   Aspirin:  Yes.   ACEI:  No. ARB:  No. Other Antiplatelet/Anticoagulant:  No.    ASSESSMENT/PLAN:: 59 y.o. 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   -The pt does have a patent right axillary to femoral bypass graft as well as  right femoral to popliteal bypass graft and is relatively unchanged from previous study. -he is having more numbness/pain in his left foot.  He does not have any non healing sores to this foot.  He does have some color changes and thickened toe nails.  We will repeat ABI's in 6 months and repeat the arterial duplex of the right leg in one year.  He will contact us sooner if he develops non healing wounds or worsening sx. -he is doing well from the left CEA and no stroke sx.  He does have numbness at the incision site and he is advised to be extra cautious when shaving.  He does have a right sided carotid bruit.  His duplex today reveals 1-49% stenosis on the right. -his left arm AVF is working well on HD.  He has had 2 procedures at the nephrology outpatient clinic in the past.  He does get significant numbness in his left hand on the HD machine.  He does also have color changes to  his fingers.  He does not have any non healing sores, but no pain.  His options at this time are ligation of the fistula or a DRIL procedure to help improve blood flow to his hand.  After discussion, he will continue to exercise his hand during HD.  If the sx worsen or he starts to get non healing wounds on his hand, he will contact us sooner than his 6 month follow up.  He expresses understanding -he continues to try to quit smoking.  He is down to about a pack that lasts him a week.  (he used to smoke 2-3 packs per day).  He is given the 1-800-QUIT-NOW number and I expressed the importance of smoking cessation.    Leontine Locket, PA-C [facility] [phone]  Clinic MD:  Pt seen and examined in conjunction with Dr. Scot Dock

## 2014-11-08 NOTE — Addendum Note (Signed)
Addended by: Mena Goes on: 11/08/2014 04:25 PM   Modules accepted: Orders

## 2014-11-09 DIAGNOSIS — D631 Anemia in chronic kidney disease: Secondary | ICD-10-CM | POA: Diagnosis not present

## 2014-11-09 DIAGNOSIS — D509 Iron deficiency anemia, unspecified: Secondary | ICD-10-CM | POA: Diagnosis not present

## 2014-11-09 DIAGNOSIS — N186 End stage renal disease: Secondary | ICD-10-CM | POA: Diagnosis not present

## 2014-11-09 DIAGNOSIS — E119 Type 2 diabetes mellitus without complications: Secondary | ICD-10-CM | POA: Diagnosis not present

## 2014-11-09 DIAGNOSIS — D689 Coagulation defect, unspecified: Secondary | ICD-10-CM | POA: Diagnosis not present

## 2014-11-09 DIAGNOSIS — R197 Diarrhea, unspecified: Secondary | ICD-10-CM | POA: Diagnosis not present

## 2014-11-09 DIAGNOSIS — D688 Other specified coagulation defects: Secondary | ICD-10-CM | POA: Diagnosis not present

## 2014-11-09 DIAGNOSIS — N2581 Secondary hyperparathyroidism of renal origin: Secondary | ICD-10-CM | POA: Diagnosis not present

## 2014-11-10 ENCOUNTER — Telehealth: Payer: Self-pay | Admitting: *Deleted

## 2014-11-10 NOTE — Telephone Encounter (Signed)
Dr. Iona Beard Adams's office [facility] and Vascular called to request NPI number for patient.  Pt has appt 11/22/14.  NPI given and they requested office notes.  Please fax notes to  [phone]. Derl Barrow, RN

## 2014-11-11 DIAGNOSIS — R197 Diarrhea, unspecified: Secondary | ICD-10-CM | POA: Diagnosis not present

## 2014-11-11 DIAGNOSIS — D631 Anemia in chronic kidney disease: Secondary | ICD-10-CM | POA: Diagnosis not present

## 2014-11-11 DIAGNOSIS — D509 Iron deficiency anemia, unspecified: Secondary | ICD-10-CM | POA: Diagnosis not present

## 2014-11-11 DIAGNOSIS — E119 Type 2 diabetes mellitus without complications: Secondary | ICD-10-CM | POA: Diagnosis not present

## 2014-11-11 DIAGNOSIS — N2581 Secondary hyperparathyroidism of renal origin: Secondary | ICD-10-CM | POA: Diagnosis not present

## 2014-11-11 DIAGNOSIS — D689 Coagulation defect, unspecified: Secondary | ICD-10-CM | POA: Diagnosis not present

## 2014-11-11 DIAGNOSIS — D688 Other specified coagulation defects: Secondary | ICD-10-CM | POA: Diagnosis not present

## 2014-11-11 DIAGNOSIS — N186 End stage renal disease: Secondary | ICD-10-CM | POA: Diagnosis not present

## 2014-11-13 NOTE — Telephone Encounter (Signed)
Faxed as requested. John [name]  

## 2014-11-13 NOTE — Telephone Encounter (Signed)
Spoke with pt and advised that we do not have signed consent to fax over these notes.  He will come by today and sign.  We will fax notes at that time. [place], Salome Spotted

## 2014-11-14 DIAGNOSIS — E119 Type 2 diabetes mellitus without complications: Secondary | ICD-10-CM | POA: Diagnosis not present

## 2014-11-14 DIAGNOSIS — D688 Other specified coagulation defects: Secondary | ICD-10-CM | POA: Diagnosis not present

## 2014-11-14 DIAGNOSIS — D509 Iron deficiency anemia, unspecified: Secondary | ICD-10-CM | POA: Diagnosis not present

## 2014-11-14 DIAGNOSIS — R197 Diarrhea, unspecified: Secondary | ICD-10-CM | POA: Diagnosis not present

## 2014-11-14 DIAGNOSIS — D631 Anemia in chronic kidney disease: Secondary | ICD-10-CM | POA: Diagnosis not present

## 2014-11-14 DIAGNOSIS — N186 End stage renal disease: Secondary | ICD-10-CM | POA: Diagnosis not present

## 2014-11-14 DIAGNOSIS — D689 Coagulation defect, unspecified: Secondary | ICD-10-CM | POA: Diagnosis not present

## 2014-11-14 DIAGNOSIS — N2581 Secondary hyperparathyroidism of renal origin: Secondary | ICD-10-CM | POA: Diagnosis not present

## 2014-11-15 ENCOUNTER — Telehealth: Payer: Self-pay | Admitting: Family Medicine

## 2014-11-15 NOTE — Telephone Encounter (Signed)
Has appt 12-06-14 Will run out of pain meds 11-22-14 Please advise

## 2014-11-15 NOTE — Telephone Encounter (Signed)
Dear John [name] Team I think he has forgotten that I gave him a prescription that he could not fill until June 6 so there is no way he should be out of medication. Please let him know this. I gave to him at last office visit. John [name]

## 2014-11-16 DIAGNOSIS — D688 Other specified coagulation defects: Secondary | ICD-10-CM | POA: Diagnosis not present

## 2014-11-16 DIAGNOSIS — N186 End stage renal disease: Secondary | ICD-10-CM | POA: Diagnosis not present

## 2014-11-16 DIAGNOSIS — D689 Coagulation defect, unspecified: Secondary | ICD-10-CM | POA: Diagnosis not present

## 2014-11-16 DIAGNOSIS — E119 Type 2 diabetes mellitus without complications: Secondary | ICD-10-CM | POA: Diagnosis not present

## 2014-11-16 DIAGNOSIS — D509 Iron deficiency anemia, unspecified: Secondary | ICD-10-CM | POA: Diagnosis not present

## 2014-11-16 DIAGNOSIS — R197 Diarrhea, unspecified: Secondary | ICD-10-CM | POA: Diagnosis not present

## 2014-11-16 DIAGNOSIS — D631 Anemia in chronic kidney disease: Secondary | ICD-10-CM | POA: Diagnosis not present

## 2014-11-16 DIAGNOSIS — N2581 Secondary hyperparathyroidism of renal origin: Secondary | ICD-10-CM | POA: Diagnosis not present

## 2014-11-16 NOTE — Telephone Encounter (Signed)
Mr. Monteforte returned call and I informed him of below. He states that he does not have this Rx on hand, although he said that he would double check for it. Thank you, Fonda Kinder, ASA

## 2014-11-16 NOTE — Telephone Encounter (Signed)
LMOVM for pt to return call.  Please inform of the below. Guy Seese Dawn  

## 2014-11-18 DIAGNOSIS — D631 Anemia in chronic kidney disease: Secondary | ICD-10-CM | POA: Diagnosis not present

## 2014-11-18 DIAGNOSIS — D689 Coagulation defect, unspecified: Secondary | ICD-10-CM | POA: Diagnosis not present

## 2014-11-18 DIAGNOSIS — N2581 Secondary hyperparathyroidism of renal origin: Secondary | ICD-10-CM | POA: Diagnosis not present

## 2014-11-18 DIAGNOSIS — E119 Type 2 diabetes mellitus without complications: Secondary | ICD-10-CM | POA: Diagnosis not present

## 2014-11-18 DIAGNOSIS — R197 Diarrhea, unspecified: Secondary | ICD-10-CM | POA: Diagnosis not present

## 2014-11-18 DIAGNOSIS — D688 Other specified coagulation defects: Secondary | ICD-10-CM | POA: Diagnosis not present

## 2014-11-18 DIAGNOSIS — D509 Iron deficiency anemia, unspecified: Secondary | ICD-10-CM | POA: Diagnosis not present

## 2014-11-18 DIAGNOSIS — N186 End stage renal disease: Secondary | ICD-10-CM | POA: Diagnosis not present

## 2014-11-20 MED ORDER — OXYCODONE-ACETAMINOPHEN 7.5-325 MG PO TABS: ORAL_TABLET | ORAL | Status: DC

## 2014-11-20 NOTE — Telephone Encounter (Signed)
Had to handwrite rx as printer ot working. Placed up front John [name]

## 2014-11-20 NOTE — Addendum Note (Signed)
Addended byDorcas Mcmurray L on: 11/20/2014 09:13 AM   Modules accepted: Orders, Medications

## 2014-11-20 NOTE — Telephone Encounter (Signed)
Re pain meds: my  Error. On review of my last note:  Encounter for chronic pain management - John La, MD at 10/19/2014 11:22 AM     Status: Written Related Problem: Encounter for chronic pain management   Expand All Collapse All   I thought his pain medication was due today so I wrote a prescription printed off however had misread the chart so I destroyed the prescription that I printed from today.        I have let him know a new rx iis upfront waiting for pick up [name]

## 2014-11-21 DIAGNOSIS — E119 Type 2 diabetes mellitus without complications: Secondary | ICD-10-CM | POA: Diagnosis not present

## 2014-11-21 DIAGNOSIS — R197 Diarrhea, unspecified: Secondary | ICD-10-CM | POA: Diagnosis not present

## 2014-11-21 DIAGNOSIS — D509 Iron deficiency anemia, unspecified: Secondary | ICD-10-CM | POA: Diagnosis not present

## 2014-11-21 DIAGNOSIS — D688 Other specified coagulation defects: Secondary | ICD-10-CM | POA: Diagnosis not present

## 2014-11-21 DIAGNOSIS — N186 End stage renal disease: Secondary | ICD-10-CM | POA: Diagnosis not present

## 2014-11-21 DIAGNOSIS — D631 Anemia in chronic kidney disease: Secondary | ICD-10-CM | POA: Diagnosis not present

## 2014-11-21 DIAGNOSIS — N2581 Secondary hyperparathyroidism of renal origin: Secondary | ICD-10-CM | POA: Diagnosis not present

## 2014-11-21 DIAGNOSIS — D689 Coagulation defect, unspecified: Secondary | ICD-10-CM | POA: Diagnosis not present

## 2014-11-22 DIAGNOSIS — E118 Type 2 diabetes mellitus with unspecified complications: Secondary | ICD-10-CM | POA: Diagnosis not present

## 2014-11-22 DIAGNOSIS — E785 Hyperlipidemia, unspecified: Secondary | ICD-10-CM | POA: Diagnosis not present

## 2014-11-22 DIAGNOSIS — I10 Essential (primary) hypertension: Secondary | ICD-10-CM | POA: Diagnosis not present

## 2014-11-22 DIAGNOSIS — I739 Peripheral vascular disease, unspecified: Secondary | ICD-10-CM | POA: Diagnosis not present

## 2014-11-22 DIAGNOSIS — I639 Cerebral infarction, unspecified: Secondary | ICD-10-CM | POA: Diagnosis not present

## 2014-11-23 DIAGNOSIS — N186 End stage renal disease: Secondary | ICD-10-CM | POA: Diagnosis not present

## 2014-11-23 DIAGNOSIS — E119 Type 2 diabetes mellitus without complications: Secondary | ICD-10-CM | POA: Diagnosis not present

## 2014-11-23 DIAGNOSIS — N2581 Secondary hyperparathyroidism of renal origin: Secondary | ICD-10-CM | POA: Diagnosis not present

## 2014-11-23 DIAGNOSIS — D689 Coagulation defect, unspecified: Secondary | ICD-10-CM | POA: Diagnosis not present

## 2014-11-23 DIAGNOSIS — R197 Diarrhea, unspecified: Secondary | ICD-10-CM | POA: Diagnosis not present

## 2014-11-23 DIAGNOSIS — D631 Anemia in chronic kidney disease: Secondary | ICD-10-CM | POA: Diagnosis not present

## 2014-11-23 DIAGNOSIS — D509 Iron deficiency anemia, unspecified: Secondary | ICD-10-CM | POA: Diagnosis not present

## 2014-11-23 DIAGNOSIS — D688 Other specified coagulation defects: Secondary | ICD-10-CM | POA: Diagnosis not present

## 2014-11-25 DIAGNOSIS — D689 Coagulation defect, unspecified: Secondary | ICD-10-CM | POA: Diagnosis not present

## 2014-11-25 DIAGNOSIS — N186 End stage renal disease: Secondary | ICD-10-CM | POA: Diagnosis not present

## 2014-11-25 DIAGNOSIS — R197 Diarrhea, unspecified: Secondary | ICD-10-CM | POA: Diagnosis not present

## 2014-11-25 DIAGNOSIS — D688 Other specified coagulation defects: Secondary | ICD-10-CM | POA: Diagnosis not present

## 2014-11-25 DIAGNOSIS — N2581 Secondary hyperparathyroidism of renal origin: Secondary | ICD-10-CM | POA: Diagnosis not present

## 2014-11-25 DIAGNOSIS — D509 Iron deficiency anemia, unspecified: Secondary | ICD-10-CM | POA: Diagnosis not present

## 2014-11-25 DIAGNOSIS — E119 Type 2 diabetes mellitus without complications: Secondary | ICD-10-CM | POA: Diagnosis not present

## 2014-11-25 DIAGNOSIS — D631 Anemia in chronic kidney disease: Secondary | ICD-10-CM | POA: Diagnosis not present

## 2014-11-28 DIAGNOSIS — D631 Anemia in chronic kidney disease: Secondary | ICD-10-CM | POA: Diagnosis not present

## 2014-11-28 DIAGNOSIS — N2581 Secondary hyperparathyroidism of renal origin: Secondary | ICD-10-CM | POA: Diagnosis not present

## 2014-11-28 DIAGNOSIS — D509 Iron deficiency anemia, unspecified: Secondary | ICD-10-CM | POA: Diagnosis not present

## 2014-11-28 DIAGNOSIS — D688 Other specified coagulation defects: Secondary | ICD-10-CM | POA: Diagnosis not present

## 2014-11-28 DIAGNOSIS — N186 End stage renal disease: Secondary | ICD-10-CM | POA: Diagnosis not present

## 2014-11-28 DIAGNOSIS — D689 Coagulation defect, unspecified: Secondary | ICD-10-CM | POA: Diagnosis not present

## 2014-11-28 DIAGNOSIS — E119 Type 2 diabetes mellitus without complications: Secondary | ICD-10-CM | POA: Diagnosis not present

## 2014-11-28 DIAGNOSIS — R197 Diarrhea, unspecified: Secondary | ICD-10-CM | POA: Diagnosis not present

## 2014-11-30 DIAGNOSIS — D631 Anemia in chronic kidney disease: Secondary | ICD-10-CM | POA: Diagnosis not present

## 2014-11-30 DIAGNOSIS — E119 Type 2 diabetes mellitus without complications: Secondary | ICD-10-CM | POA: Diagnosis not present

## 2014-11-30 DIAGNOSIS — D689 Coagulation defect, unspecified: Secondary | ICD-10-CM | POA: Diagnosis not present

## 2014-11-30 DIAGNOSIS — R197 Diarrhea, unspecified: Secondary | ICD-10-CM | POA: Diagnosis not present

## 2014-11-30 DIAGNOSIS — N2581 Secondary hyperparathyroidism of renal origin: Secondary | ICD-10-CM | POA: Diagnosis not present

## 2014-11-30 DIAGNOSIS — D688 Other specified coagulation defects: Secondary | ICD-10-CM | POA: Diagnosis not present

## 2014-11-30 DIAGNOSIS — N186 End stage renal disease: Secondary | ICD-10-CM | POA: Diagnosis not present

## 2014-11-30 DIAGNOSIS — D509 Iron deficiency anemia, unspecified: Secondary | ICD-10-CM | POA: Diagnosis not present

## 2014-12-02 DIAGNOSIS — R197 Diarrhea, unspecified: Secondary | ICD-10-CM | POA: Diagnosis not present

## 2014-12-02 DIAGNOSIS — D631 Anemia in chronic kidney disease: Secondary | ICD-10-CM | POA: Diagnosis not present

## 2014-12-02 DIAGNOSIS — N2581 Secondary hyperparathyroidism of renal origin: Secondary | ICD-10-CM | POA: Diagnosis not present

## 2014-12-02 DIAGNOSIS — D509 Iron deficiency anemia, unspecified: Secondary | ICD-10-CM | POA: Diagnosis not present

## 2014-12-02 DIAGNOSIS — D689 Coagulation defect, unspecified: Secondary | ICD-10-CM | POA: Diagnosis not present

## 2014-12-02 DIAGNOSIS — N186 End stage renal disease: Secondary | ICD-10-CM | POA: Diagnosis not present

## 2014-12-02 DIAGNOSIS — E119 Type 2 diabetes mellitus without complications: Secondary | ICD-10-CM | POA: Diagnosis not present

## 2014-12-02 DIAGNOSIS — D688 Other specified coagulation defects: Secondary | ICD-10-CM | POA: Diagnosis not present

## 2014-12-05 DIAGNOSIS — N186 End stage renal disease: Secondary | ICD-10-CM | POA: Diagnosis not present

## 2014-12-05 DIAGNOSIS — D688 Other specified coagulation defects: Secondary | ICD-10-CM | POA: Diagnosis not present

## 2014-12-05 DIAGNOSIS — D509 Iron deficiency anemia, unspecified: Secondary | ICD-10-CM | POA: Diagnosis not present

## 2014-12-05 DIAGNOSIS — R197 Diarrhea, unspecified: Secondary | ICD-10-CM | POA: Diagnosis not present

## 2014-12-05 DIAGNOSIS — D689 Coagulation defect, unspecified: Secondary | ICD-10-CM | POA: Diagnosis not present

## 2014-12-05 DIAGNOSIS — D631 Anemia in chronic kidney disease: Secondary | ICD-10-CM | POA: Diagnosis not present

## 2014-12-05 DIAGNOSIS — N2581 Secondary hyperparathyroidism of renal origin: Secondary | ICD-10-CM | POA: Diagnosis not present

## 2014-12-05 DIAGNOSIS — E119 Type 2 diabetes mellitus without complications: Secondary | ICD-10-CM | POA: Diagnosis not present

## 2014-12-06 ENCOUNTER — Encounter: Payer: Self-pay | Admitting: Family Medicine

## 2014-12-06 ENCOUNTER — Ambulatory Visit (INDEPENDENT_AMBULATORY_CARE_PROVIDER_SITE_OTHER): Payer: Medicare Other | Admitting: Family Medicine

## 2014-12-06 VITALS — BP 116/39 | HR 86 | Temp 98.3°F | Ht 71.0 in | Wt 192.0 lb

## 2014-12-06 DIAGNOSIS — G8929 Other chronic pain: Secondary | ICD-10-CM

## 2014-12-06 DIAGNOSIS — Z7189 Other specified counseling: Secondary | ICD-10-CM | POA: Diagnosis not present

## 2014-12-06 DIAGNOSIS — M79602 Pain in left arm: Secondary | ICD-10-CM | POA: Diagnosis not present

## 2014-12-06 MED ORDER — OXYCODONE-ACETAMINOPHEN 7.5-325 MG PO TABS: ORAL_TABLET | ORAL | Status: DC

## 2014-12-07 DIAGNOSIS — E1129 Type 2 diabetes mellitus with other diabetic kidney complication: Secondary | ICD-10-CM | POA: Diagnosis not present

## 2014-12-07 DIAGNOSIS — N2581 Secondary hyperparathyroidism of renal origin: Secondary | ICD-10-CM | POA: Diagnosis not present

## 2014-12-07 DIAGNOSIS — N186 End stage renal disease: Secondary | ICD-10-CM | POA: Diagnosis not present

## 2014-12-07 DIAGNOSIS — R197 Diarrhea, unspecified: Secondary | ICD-10-CM | POA: Diagnosis not present

## 2014-12-07 DIAGNOSIS — D509 Iron deficiency anemia, unspecified: Secondary | ICD-10-CM | POA: Diagnosis not present

## 2014-12-07 DIAGNOSIS — Z992 Dependence on renal dialysis: Secondary | ICD-10-CM | POA: Diagnosis not present

## 2014-12-07 DIAGNOSIS — D631 Anemia in chronic kidney disease: Secondary | ICD-10-CM | POA: Diagnosis not present

## 2014-12-07 DIAGNOSIS — E119 Type 2 diabetes mellitus without complications: Secondary | ICD-10-CM | POA: Diagnosis not present

## 2014-12-07 DIAGNOSIS — D688 Other specified coagulation defects: Secondary | ICD-10-CM | POA: Diagnosis not present

## 2014-12-07 DIAGNOSIS — D689 Coagulation defect, unspecified: Secondary | ICD-10-CM | POA: Diagnosis not present

## 2014-12-08 NOTE — Assessment & Plan Note (Signed)
Slightly worsening hand pain. He also has some increased hyperpigmentation of the palmar surface of his hand compared with the right. I'm not sure of the etiology of that.

## 2014-12-08 NOTE — Assessment & Plan Note (Signed)
Refilled his medications refilled follow-up one month.

## 2014-12-08 NOTE — Progress Notes (Signed)
   Subjective:    Patient ID: John [name], male    DOB: 06-30-1955, 59 y.o.   MRN: [id number]  HPI Wants to update me on several things. Having lower extremity arteriogram next week. Also may have to have his AV fistula in his arm redone. Having worsening left hand pain. Needs refill on his pain medicines.   Review of Systems No unusual weight change, pain is slightly worse in his left hand but otherwise his baseline. Appetite is okay. Energy level is baseline.    Objective:   Physical Exam Powell signs are reviewed GENERAL: Thin male no acute distress CV: Regular rate and rhythm EXTREMITY: Bilateral hands have some thenar wasting.       Assessment & Plan:

## 2014-12-09 DIAGNOSIS — D631 Anemia in chronic kidney disease: Secondary | ICD-10-CM | POA: Diagnosis not present

## 2014-12-09 DIAGNOSIS — D688 Other specified coagulation defects: Secondary | ICD-10-CM | POA: Diagnosis not present

## 2014-12-09 DIAGNOSIS — N186 End stage renal disease: Secondary | ICD-10-CM | POA: Diagnosis not present

## 2014-12-09 DIAGNOSIS — D509 Iron deficiency anemia, unspecified: Secondary | ICD-10-CM | POA: Diagnosis not present

## 2014-12-09 DIAGNOSIS — D689 Coagulation defect, unspecified: Secondary | ICD-10-CM | POA: Diagnosis not present

## 2014-12-09 DIAGNOSIS — E119 Type 2 diabetes mellitus without complications: Secondary | ICD-10-CM | POA: Diagnosis not present

## 2014-12-12 DIAGNOSIS — N186 End stage renal disease: Secondary | ICD-10-CM | POA: Diagnosis not present

## 2014-12-12 DIAGNOSIS — D631 Anemia in chronic kidney disease: Secondary | ICD-10-CM | POA: Diagnosis not present

## 2014-12-12 DIAGNOSIS — D509 Iron deficiency anemia, unspecified: Secondary | ICD-10-CM | POA: Diagnosis not present

## 2014-12-12 DIAGNOSIS — D689 Coagulation defect, unspecified: Secondary | ICD-10-CM | POA: Diagnosis not present

## 2014-12-12 DIAGNOSIS — E119 Type 2 diabetes mellitus without complications: Secondary | ICD-10-CM | POA: Diagnosis not present

## 2014-12-12 DIAGNOSIS — D688 Other specified coagulation defects: Secondary | ICD-10-CM | POA: Diagnosis not present

## 2014-12-14 DIAGNOSIS — E119 Type 2 diabetes mellitus without complications: Secondary | ICD-10-CM | POA: Diagnosis not present

## 2014-12-14 DIAGNOSIS — N186 End stage renal disease: Secondary | ICD-10-CM | POA: Diagnosis not present

## 2014-12-14 DIAGNOSIS — D631 Anemia in chronic kidney disease: Secondary | ICD-10-CM | POA: Diagnosis not present

## 2014-12-14 DIAGNOSIS — D688 Other specified coagulation defects: Secondary | ICD-10-CM | POA: Diagnosis not present

## 2014-12-14 DIAGNOSIS — D689 Coagulation defect, unspecified: Secondary | ICD-10-CM | POA: Diagnosis not present

## 2014-12-14 DIAGNOSIS — D509 Iron deficiency anemia, unspecified: Secondary | ICD-10-CM | POA: Diagnosis not present

## 2014-12-16 DIAGNOSIS — D688 Other specified coagulation defects: Secondary | ICD-10-CM | POA: Diagnosis not present

## 2014-12-16 DIAGNOSIS — D689 Coagulation defect, unspecified: Secondary | ICD-10-CM | POA: Diagnosis not present

## 2014-12-16 DIAGNOSIS — D631 Anemia in chronic kidney disease: Secondary | ICD-10-CM | POA: Diagnosis not present

## 2014-12-16 DIAGNOSIS — E119 Type 2 diabetes mellitus without complications: Secondary | ICD-10-CM | POA: Diagnosis not present

## 2014-12-16 DIAGNOSIS — N186 End stage renal disease: Secondary | ICD-10-CM | POA: Diagnosis not present

## 2014-12-16 DIAGNOSIS — D509 Iron deficiency anemia, unspecified: Secondary | ICD-10-CM | POA: Diagnosis not present

## 2014-12-19 DIAGNOSIS — D631 Anemia in chronic kidney disease: Secondary | ICD-10-CM | POA: Diagnosis not present

## 2014-12-19 DIAGNOSIS — D509 Iron deficiency anemia, unspecified: Secondary | ICD-10-CM | POA: Diagnosis not present

## 2014-12-19 DIAGNOSIS — E119 Type 2 diabetes mellitus without complications: Secondary | ICD-10-CM | POA: Diagnosis not present

## 2014-12-19 DIAGNOSIS — D688 Other specified coagulation defects: Secondary | ICD-10-CM | POA: Diagnosis not present

## 2014-12-19 DIAGNOSIS — N186 End stage renal disease: Secondary | ICD-10-CM | POA: Diagnosis not present

## 2014-12-19 DIAGNOSIS — D689 Coagulation defect, unspecified: Secondary | ICD-10-CM | POA: Diagnosis not present

## 2014-12-21 DIAGNOSIS — N186 End stage renal disease: Secondary | ICD-10-CM | POA: Diagnosis not present

## 2014-12-21 DIAGNOSIS — D509 Iron deficiency anemia, unspecified: Secondary | ICD-10-CM | POA: Diagnosis not present

## 2014-12-21 DIAGNOSIS — D631 Anemia in chronic kidney disease: Secondary | ICD-10-CM | POA: Diagnosis not present

## 2014-12-21 DIAGNOSIS — D688 Other specified coagulation defects: Secondary | ICD-10-CM | POA: Diagnosis not present

## 2014-12-21 DIAGNOSIS — E119 Type 2 diabetes mellitus without complications: Secondary | ICD-10-CM | POA: Diagnosis not present

## 2014-12-21 DIAGNOSIS — D689 Coagulation defect, unspecified: Secondary | ICD-10-CM | POA: Diagnosis not present

## 2014-12-23 DIAGNOSIS — D631 Anemia in chronic kidney disease: Secondary | ICD-10-CM | POA: Diagnosis not present

## 2014-12-23 DIAGNOSIS — E119 Type 2 diabetes mellitus without complications: Secondary | ICD-10-CM | POA: Diagnosis not present

## 2014-12-23 DIAGNOSIS — D688 Other specified coagulation defects: Secondary | ICD-10-CM | POA: Diagnosis not present

## 2014-12-23 DIAGNOSIS — D509 Iron deficiency anemia, unspecified: Secondary | ICD-10-CM | POA: Diagnosis not present

## 2014-12-23 DIAGNOSIS — D689 Coagulation defect, unspecified: Secondary | ICD-10-CM | POA: Diagnosis not present

## 2014-12-23 DIAGNOSIS — N186 End stage renal disease: Secondary | ICD-10-CM | POA: Diagnosis not present

## 2014-12-26 DIAGNOSIS — D631 Anemia in chronic kidney disease: Secondary | ICD-10-CM | POA: Diagnosis not present

## 2014-12-26 DIAGNOSIS — D688 Other specified coagulation defects: Secondary | ICD-10-CM | POA: Diagnosis not present

## 2014-12-26 DIAGNOSIS — N186 End stage renal disease: Secondary | ICD-10-CM | POA: Diagnosis not present

## 2014-12-26 DIAGNOSIS — D689 Coagulation defect, unspecified: Secondary | ICD-10-CM | POA: Diagnosis not present

## 2014-12-26 DIAGNOSIS — E119 Type 2 diabetes mellitus without complications: Secondary | ICD-10-CM | POA: Diagnosis not present

## 2014-12-26 DIAGNOSIS — D509 Iron deficiency anemia, unspecified: Secondary | ICD-10-CM | POA: Diagnosis not present

## 2014-12-28 DIAGNOSIS — D509 Iron deficiency anemia, unspecified: Secondary | ICD-10-CM | POA: Diagnosis not present

## 2014-12-28 DIAGNOSIS — D688 Other specified coagulation defects: Secondary | ICD-10-CM | POA: Diagnosis not present

## 2014-12-28 DIAGNOSIS — E119 Type 2 diabetes mellitus without complications: Secondary | ICD-10-CM | POA: Diagnosis not present

## 2014-12-28 DIAGNOSIS — D689 Coagulation defect, unspecified: Secondary | ICD-10-CM | POA: Diagnosis not present

## 2014-12-28 DIAGNOSIS — D631 Anemia in chronic kidney disease: Secondary | ICD-10-CM | POA: Diagnosis not present

## 2014-12-28 DIAGNOSIS — N186 End stage renal disease: Secondary | ICD-10-CM | POA: Diagnosis not present

## 2014-12-29 DIAGNOSIS — D689 Coagulation defect, unspecified: Secondary | ICD-10-CM | POA: Diagnosis not present

## 2014-12-29 DIAGNOSIS — D631 Anemia in chronic kidney disease: Secondary | ICD-10-CM | POA: Diagnosis not present

## 2014-12-29 DIAGNOSIS — D688 Other specified coagulation defects: Secondary | ICD-10-CM | POA: Diagnosis not present

## 2014-12-29 DIAGNOSIS — D509 Iron deficiency anemia, unspecified: Secondary | ICD-10-CM | POA: Diagnosis not present

## 2014-12-29 DIAGNOSIS — N186 End stage renal disease: Secondary | ICD-10-CM | POA: Diagnosis not present

## 2014-12-29 DIAGNOSIS — E119 Type 2 diabetes mellitus without complications: Secondary | ICD-10-CM | POA: Diagnosis not present

## 2015-01-01 DIAGNOSIS — I120 Hypertensive chronic kidney disease with stage 5 chronic kidney disease or end stage renal disease: Secondary | ICD-10-CM | POA: Diagnosis not present

## 2015-01-01 DIAGNOSIS — Z8673 Personal history of transient ischemic attack (TIA), and cerebral infarction without residual deficits: Secondary | ICD-10-CM | POA: Diagnosis not present

## 2015-01-01 DIAGNOSIS — F17210 Nicotine dependence, cigarettes, uncomplicated: Secondary | ICD-10-CM | POA: Diagnosis not present

## 2015-01-01 DIAGNOSIS — E780 Pure hypercholesterolemia: Secondary | ICD-10-CM | POA: Diagnosis not present

## 2015-01-01 DIAGNOSIS — E119 Type 2 diabetes mellitus without complications: Secondary | ICD-10-CM | POA: Diagnosis not present

## 2015-01-01 DIAGNOSIS — E785 Hyperlipidemia, unspecified: Secondary | ICD-10-CM | POA: Diagnosis not present

## 2015-01-01 DIAGNOSIS — J449 Chronic obstructive pulmonary disease, unspecified: Secondary | ICD-10-CM | POA: Diagnosis not present

## 2015-01-01 DIAGNOSIS — I70212 Atherosclerosis of native arteries of extremities with intermittent claudication, left leg: Secondary | ICD-10-CM | POA: Diagnosis not present

## 2015-01-01 DIAGNOSIS — I739 Peripheral vascular disease, unspecified: Secondary | ICD-10-CM | POA: Diagnosis not present

## 2015-01-01 DIAGNOSIS — Z09 Encounter for follow-up examination after completed treatment for conditions other than malignant neoplasm: Secondary | ICD-10-CM | POA: Diagnosis not present

## 2015-01-01 DIAGNOSIS — Z89421 Acquired absence of other right toe(s): Secondary | ICD-10-CM | POA: Diagnosis not present

## 2015-01-01 DIAGNOSIS — N186 End stage renal disease: Secondary | ICD-10-CM | POA: Diagnosis not present

## 2015-01-01 DIAGNOSIS — Z794 Long term (current) use of insulin: Secondary | ICD-10-CM | POA: Diagnosis not present

## 2015-01-01 DIAGNOSIS — I771 Stricture of artery: Secondary | ICD-10-CM | POA: Diagnosis not present

## 2015-01-01 DIAGNOSIS — Z992 Dependence on renal dialysis: Secondary | ICD-10-CM | POA: Diagnosis not present

## 2015-01-01 DIAGNOSIS — Z951 Presence of aortocoronary bypass graft: Secondary | ICD-10-CM | POA: Diagnosis not present

## 2015-01-02 DIAGNOSIS — Z992 Dependence on renal dialysis: Secondary | ICD-10-CM | POA: Diagnosis not present

## 2015-01-02 DIAGNOSIS — I70212 Atherosclerosis of native arteries of extremities with intermittent claudication, left leg: Secondary | ICD-10-CM | POA: Diagnosis not present

## 2015-01-02 DIAGNOSIS — E780 Pure hypercholesterolemia: Secondary | ICD-10-CM | POA: Diagnosis not present

## 2015-01-02 DIAGNOSIS — D689 Coagulation defect, unspecified: Secondary | ICD-10-CM | POA: Diagnosis not present

## 2015-01-02 DIAGNOSIS — N186 End stage renal disease: Secondary | ICD-10-CM | POA: Diagnosis not present

## 2015-01-02 DIAGNOSIS — I120 Hypertensive chronic kidney disease with stage 5 chronic kidney disease or end stage renal disease: Secondary | ICD-10-CM | POA: Diagnosis not present

## 2015-01-02 DIAGNOSIS — Z89421 Acquired absence of other right toe(s): Secondary | ICD-10-CM | POA: Diagnosis not present

## 2015-01-02 DIAGNOSIS — J449 Chronic obstructive pulmonary disease, unspecified: Secondary | ICD-10-CM | POA: Diagnosis not present

## 2015-01-02 DIAGNOSIS — D688 Other specified coagulation defects: Secondary | ICD-10-CM | POA: Diagnosis not present

## 2015-01-02 DIAGNOSIS — F17210 Nicotine dependence, cigarettes, uncomplicated: Secondary | ICD-10-CM | POA: Diagnosis not present

## 2015-01-02 DIAGNOSIS — Z8673 Personal history of transient ischemic attack (TIA), and cerebral infarction without residual deficits: Secondary | ICD-10-CM | POA: Diagnosis not present

## 2015-01-02 DIAGNOSIS — D631 Anemia in chronic kidney disease: Secondary | ICD-10-CM | POA: Diagnosis not present

## 2015-01-02 DIAGNOSIS — E785 Hyperlipidemia, unspecified: Secondary | ICD-10-CM | POA: Diagnosis not present

## 2015-01-02 DIAGNOSIS — Z951 Presence of aortocoronary bypass graft: Secondary | ICD-10-CM | POA: Diagnosis not present

## 2015-01-02 DIAGNOSIS — D509 Iron deficiency anemia, unspecified: Secondary | ICD-10-CM | POA: Diagnosis not present

## 2015-01-02 DIAGNOSIS — Z794 Long term (current) use of insulin: Secondary | ICD-10-CM | POA: Diagnosis not present

## 2015-01-02 DIAGNOSIS — E119 Type 2 diabetes mellitus without complications: Secondary | ICD-10-CM | POA: Diagnosis not present

## 2015-01-04 DIAGNOSIS — E119 Type 2 diabetes mellitus without complications: Secondary | ICD-10-CM | POA: Diagnosis not present

## 2015-01-04 DIAGNOSIS — D631 Anemia in chronic kidney disease: Secondary | ICD-10-CM | POA: Diagnosis not present

## 2015-01-04 DIAGNOSIS — D689 Coagulation defect, unspecified: Secondary | ICD-10-CM | POA: Diagnosis not present

## 2015-01-04 DIAGNOSIS — N186 End stage renal disease: Secondary | ICD-10-CM | POA: Diagnosis not present

## 2015-01-04 DIAGNOSIS — D509 Iron deficiency anemia, unspecified: Secondary | ICD-10-CM | POA: Diagnosis not present

## 2015-01-04 DIAGNOSIS — D688 Other specified coagulation defects: Secondary | ICD-10-CM | POA: Diagnosis not present

## 2015-01-05 ENCOUNTER — Other Ambulatory Visit: Payer: Self-pay | Admitting: Family Medicine

## 2015-01-05 MED ORDER — OXYCODONE-ACETAMINOPHEN 7.5-325 MG PO TABS: ORAL_TABLET | ORAL | Status: DC

## 2015-01-05 NOTE — Telephone Encounter (Signed)
Spoke to Mr. Bledsoe. Informed him of the information below. He said he would be here by 5:00pm to get the Rx. Ottis Stain, CMA

## 2015-01-05 NOTE — Telephone Encounter (Signed)
Need to have refill on his pain  Medication and to discuss recent surgery he had down in [place] for his heart.

## 2015-01-05 NOTE — Telephone Encounter (Signed)
Dear John [name] Team plz tell him his rx is p front and I am out of town until Aug 8th---unless it is urgent I will call him when I get back re his surgery THANKS! [name]

## 2015-01-06 DIAGNOSIS — D688 Other specified coagulation defects: Secondary | ICD-10-CM | POA: Diagnosis not present

## 2015-01-06 DIAGNOSIS — D509 Iron deficiency anemia, unspecified: Secondary | ICD-10-CM | POA: Diagnosis not present

## 2015-01-06 DIAGNOSIS — N186 End stage renal disease: Secondary | ICD-10-CM | POA: Diagnosis not present

## 2015-01-06 DIAGNOSIS — D631 Anemia in chronic kidney disease: Secondary | ICD-10-CM | POA: Diagnosis not present

## 2015-01-06 DIAGNOSIS — D689 Coagulation defect, unspecified: Secondary | ICD-10-CM | POA: Diagnosis not present

## 2015-01-06 DIAGNOSIS — E119 Type 2 diabetes mellitus without complications: Secondary | ICD-10-CM | POA: Diagnosis not present

## 2015-01-07 DIAGNOSIS — E1129 Type 2 diabetes mellitus with other diabetic kidney complication: Secondary | ICD-10-CM | POA: Diagnosis not present

## 2015-01-07 DIAGNOSIS — Z992 Dependence on renal dialysis: Secondary | ICD-10-CM | POA: Diagnosis not present

## 2015-01-07 DIAGNOSIS — N186 End stage renal disease: Secondary | ICD-10-CM | POA: Diagnosis not present

## 2015-01-09 DIAGNOSIS — D689 Coagulation defect, unspecified: Secondary | ICD-10-CM | POA: Diagnosis not present

## 2015-01-09 DIAGNOSIS — D631 Anemia in chronic kidney disease: Secondary | ICD-10-CM | POA: Diagnosis not present

## 2015-01-09 DIAGNOSIS — N186 End stage renal disease: Secondary | ICD-10-CM | POA: Diagnosis not present

## 2015-01-09 DIAGNOSIS — R52 Pain, unspecified: Secondary | ICD-10-CM | POA: Diagnosis not present

## 2015-01-09 DIAGNOSIS — D509 Iron deficiency anemia, unspecified: Secondary | ICD-10-CM | POA: Diagnosis not present

## 2015-01-09 DIAGNOSIS — D688 Other specified coagulation defects: Secondary | ICD-10-CM | POA: Diagnosis not present

## 2015-01-09 DIAGNOSIS — R51 Headache: Secondary | ICD-10-CM | POA: Diagnosis not present

## 2015-01-09 DIAGNOSIS — E119 Type 2 diabetes mellitus without complications: Secondary | ICD-10-CM | POA: Diagnosis not present

## 2015-01-09 DIAGNOSIS — N2581 Secondary hyperparathyroidism of renal origin: Secondary | ICD-10-CM | POA: Diagnosis not present

## 2015-01-10 ENCOUNTER — Telehealth: Payer: Self-pay | Admitting: Family Medicine

## 2015-01-10 DIAGNOSIS — I998 Other disorder of circulatory system: Secondary | ICD-10-CM | POA: Diagnosis not present

## 2015-01-10 DIAGNOSIS — I739 Peripheral vascular disease, unspecified: Secondary | ICD-10-CM | POA: Diagnosis not present

## 2015-01-10 DIAGNOSIS — M79605 Pain in left leg: Secondary | ICD-10-CM | POA: Diagnosis not present

## 2015-01-10 DIAGNOSIS — I708 Atherosclerosis of other arteries: Secondary | ICD-10-CM | POA: Diagnosis not present

## 2015-01-10 DIAGNOSIS — N186 End stage renal disease: Secondary | ICD-10-CM | POA: Diagnosis not present

## 2015-01-10 NOTE — Telephone Encounter (Signed)
Order for home health has been sent to [facility].  Hunt Oris, MSW, [place]

## 2015-01-10 NOTE — Telephone Encounter (Signed)
[name] called and would like to get home health for John [name]. She wanted [place] to start the process. jw

## 2015-01-10 NOTE — Telephone Encounter (Signed)
PT spouse informed that the home health care order had been placed. John [name], Roniel Halloran D, [place]

## 2015-01-11 DIAGNOSIS — M549 Dorsalgia, unspecified: Secondary | ICD-10-CM | POA: Diagnosis not present

## 2015-01-11 DIAGNOSIS — I739 Peripheral vascular disease, unspecified: Secondary | ICD-10-CM | POA: Diagnosis not present

## 2015-01-11 DIAGNOSIS — D509 Iron deficiency anemia, unspecified: Secondary | ICD-10-CM | POA: Diagnosis not present

## 2015-01-11 DIAGNOSIS — N2581 Secondary hyperparathyroidism of renal origin: Secondary | ICD-10-CM | POA: Diagnosis not present

## 2015-01-11 DIAGNOSIS — R51 Headache: Secondary | ICD-10-CM | POA: Diagnosis not present

## 2015-01-11 DIAGNOSIS — E1140 Type 2 diabetes mellitus with diabetic neuropathy, unspecified: Secondary | ICD-10-CM | POA: Diagnosis not present

## 2015-01-11 DIAGNOSIS — R52 Pain, unspecified: Secondary | ICD-10-CM | POA: Diagnosis not present

## 2015-01-11 DIAGNOSIS — D688 Other specified coagulation defects: Secondary | ICD-10-CM | POA: Diagnosis not present

## 2015-01-11 DIAGNOSIS — N186 End stage renal disease: Secondary | ICD-10-CM | POA: Diagnosis not present

## 2015-01-11 DIAGNOSIS — D689 Coagulation defect, unspecified: Secondary | ICD-10-CM | POA: Diagnosis not present

## 2015-01-11 DIAGNOSIS — E119 Type 2 diabetes mellitus without complications: Secondary | ICD-10-CM | POA: Diagnosis not present

## 2015-01-11 DIAGNOSIS — E1129 Type 2 diabetes mellitus with other diabetic kidney complication: Secondary | ICD-10-CM | POA: Diagnosis not present

## 2015-01-11 DIAGNOSIS — D631 Anemia in chronic kidney disease: Secondary | ICD-10-CM | POA: Diagnosis not present

## 2015-01-13 DIAGNOSIS — E119 Type 2 diabetes mellitus without complications: Secondary | ICD-10-CM | POA: Diagnosis not present

## 2015-01-13 DIAGNOSIS — D688 Other specified coagulation defects: Secondary | ICD-10-CM | POA: Diagnosis not present

## 2015-01-13 DIAGNOSIS — N2581 Secondary hyperparathyroidism of renal origin: Secondary | ICD-10-CM | POA: Diagnosis not present

## 2015-01-13 DIAGNOSIS — D689 Coagulation defect, unspecified: Secondary | ICD-10-CM | POA: Diagnosis not present

## 2015-01-13 DIAGNOSIS — R52 Pain, unspecified: Secondary | ICD-10-CM | POA: Diagnosis not present

## 2015-01-13 DIAGNOSIS — R51 Headache: Secondary | ICD-10-CM | POA: Diagnosis not present

## 2015-01-13 DIAGNOSIS — D509 Iron deficiency anemia, unspecified: Secondary | ICD-10-CM | POA: Diagnosis not present

## 2015-01-13 DIAGNOSIS — D631 Anemia in chronic kidney disease: Secondary | ICD-10-CM | POA: Diagnosis not present

## 2015-01-13 DIAGNOSIS — N186 End stage renal disease: Secondary | ICD-10-CM | POA: Diagnosis not present

## 2015-01-15 DIAGNOSIS — Z8673 Personal history of transient ischemic attack (TIA), and cerebral infarction without residual deficits: Secondary | ICD-10-CM | POA: Diagnosis not present

## 2015-01-15 DIAGNOSIS — Z992 Dependence on renal dialysis: Secondary | ICD-10-CM | POA: Diagnosis not present

## 2015-01-15 DIAGNOSIS — I745 Embolism and thrombosis of iliac artery: Secondary | ICD-10-CM | POA: Diagnosis not present

## 2015-01-15 DIAGNOSIS — E785 Hyperlipidemia, unspecified: Secondary | ICD-10-CM | POA: Diagnosis not present

## 2015-01-15 DIAGNOSIS — I739 Peripheral vascular disease, unspecified: Secondary | ICD-10-CM | POA: Diagnosis not present

## 2015-01-15 DIAGNOSIS — Z89421 Acquired absence of other right toe(s): Secondary | ICD-10-CM | POA: Diagnosis not present

## 2015-01-15 DIAGNOSIS — M79605 Pain in left leg: Secondary | ICD-10-CM | POA: Diagnosis not present

## 2015-01-15 DIAGNOSIS — E119 Type 2 diabetes mellitus without complications: Secondary | ICD-10-CM | POA: Diagnosis not present

## 2015-01-15 DIAGNOSIS — I771 Stricture of artery: Secondary | ICD-10-CM | POA: Diagnosis not present

## 2015-01-15 DIAGNOSIS — F17210 Nicotine dependence, cigarettes, uncomplicated: Secondary | ICD-10-CM | POA: Diagnosis not present

## 2015-01-15 DIAGNOSIS — I10 Essential (primary) hypertension: Secondary | ICD-10-CM | POA: Diagnosis not present

## 2015-01-15 DIAGNOSIS — Z951 Presence of aortocoronary bypass graft: Secondary | ICD-10-CM | POA: Diagnosis not present

## 2015-01-15 DIAGNOSIS — N186 End stage renal disease: Secondary | ICD-10-CM | POA: Diagnosis not present

## 2015-01-15 DIAGNOSIS — J449 Chronic obstructive pulmonary disease, unspecified: Secondary | ICD-10-CM | POA: Diagnosis not present

## 2015-01-15 DIAGNOSIS — I70222 Atherosclerosis of native arteries of extremities with rest pain, left leg: Secondary | ICD-10-CM | POA: Diagnosis not present

## 2015-01-16 DIAGNOSIS — E785 Hyperlipidemia, unspecified: Secondary | ICD-10-CM | POA: Diagnosis not present

## 2015-01-16 DIAGNOSIS — J449 Chronic obstructive pulmonary disease, unspecified: Secondary | ICD-10-CM | POA: Diagnosis not present

## 2015-01-16 DIAGNOSIS — F17210 Nicotine dependence, cigarettes, uncomplicated: Secondary | ICD-10-CM | POA: Diagnosis not present

## 2015-01-16 DIAGNOSIS — Z992 Dependence on renal dialysis: Secondary | ICD-10-CM | POA: Diagnosis not present

## 2015-01-16 DIAGNOSIS — I745 Embolism and thrombosis of iliac artery: Secondary | ICD-10-CM | POA: Diagnosis not present

## 2015-01-16 DIAGNOSIS — M79605 Pain in left leg: Secondary | ICD-10-CM | POA: Diagnosis not present

## 2015-01-16 DIAGNOSIS — I70222 Atherosclerosis of native arteries of extremities with rest pain, left leg: Secondary | ICD-10-CM | POA: Diagnosis not present

## 2015-01-16 DIAGNOSIS — Z89421 Acquired absence of other right toe(s): Secondary | ICD-10-CM | POA: Diagnosis not present

## 2015-01-16 DIAGNOSIS — Z951 Presence of aortocoronary bypass graft: Secondary | ICD-10-CM | POA: Diagnosis not present

## 2015-01-16 DIAGNOSIS — N186 End stage renal disease: Secondary | ICD-10-CM | POA: Diagnosis not present

## 2015-01-16 DIAGNOSIS — E119 Type 2 diabetes mellitus without complications: Secondary | ICD-10-CM | POA: Diagnosis not present

## 2015-01-16 DIAGNOSIS — Z8673 Personal history of transient ischemic attack (TIA), and cerebral infarction without residual deficits: Secondary | ICD-10-CM | POA: Diagnosis not present

## 2015-01-16 DIAGNOSIS — I739 Peripheral vascular disease, unspecified: Secondary | ICD-10-CM | POA: Diagnosis not present

## 2015-01-16 DIAGNOSIS — I10 Essential (primary) hypertension: Secondary | ICD-10-CM | POA: Diagnosis not present

## 2015-01-17 ENCOUNTER — Telehealth: Payer: Self-pay | Admitting: Family Medicine

## 2015-01-17 DIAGNOSIS — R52 Pain, unspecified: Secondary | ICD-10-CM | POA: Diagnosis not present

## 2015-01-17 DIAGNOSIS — D631 Anemia in chronic kidney disease: Secondary | ICD-10-CM | POA: Diagnosis not present

## 2015-01-17 DIAGNOSIS — D509 Iron deficiency anemia, unspecified: Secondary | ICD-10-CM | POA: Diagnosis not present

## 2015-01-17 DIAGNOSIS — D688 Other specified coagulation defects: Secondary | ICD-10-CM | POA: Diagnosis not present

## 2015-01-17 DIAGNOSIS — D689 Coagulation defect, unspecified: Secondary | ICD-10-CM | POA: Diagnosis not present

## 2015-01-17 DIAGNOSIS — E119 Type 2 diabetes mellitus without complications: Secondary | ICD-10-CM | POA: Diagnosis not present

## 2015-01-17 DIAGNOSIS — N2581 Secondary hyperparathyroidism of renal origin: Secondary | ICD-10-CM | POA: Diagnosis not present

## 2015-01-17 DIAGNOSIS — R51 Headache: Secondary | ICD-10-CM | POA: Diagnosis not present

## 2015-01-17 DIAGNOSIS — N186 End stage renal disease: Secondary | ICD-10-CM | POA: Diagnosis not present

## 2015-01-17 NOTE — Telephone Encounter (Signed)
Wife dropped off form to be completed for personal care.  Please call her when ready to be picked up.

## 2015-01-18 DIAGNOSIS — D689 Coagulation defect, unspecified: Secondary | ICD-10-CM | POA: Diagnosis not present

## 2015-01-18 DIAGNOSIS — R51 Headache: Secondary | ICD-10-CM | POA: Diagnosis not present

## 2015-01-18 DIAGNOSIS — D688 Other specified coagulation defects: Secondary | ICD-10-CM | POA: Diagnosis not present

## 2015-01-18 DIAGNOSIS — N2581 Secondary hyperparathyroidism of renal origin: Secondary | ICD-10-CM | POA: Diagnosis not present

## 2015-01-18 DIAGNOSIS — D509 Iron deficiency anemia, unspecified: Secondary | ICD-10-CM | POA: Diagnosis not present

## 2015-01-18 DIAGNOSIS — E119 Type 2 diabetes mellitus without complications: Secondary | ICD-10-CM | POA: Diagnosis not present

## 2015-01-18 DIAGNOSIS — R52 Pain, unspecified: Secondary | ICD-10-CM | POA: Diagnosis not present

## 2015-01-18 DIAGNOSIS — D631 Anemia in chronic kidney disease: Secondary | ICD-10-CM | POA: Diagnosis not present

## 2015-01-18 DIAGNOSIS — N186 End stage renal disease: Secondary | ICD-10-CM | POA: Diagnosis not present

## 2015-01-18 NOTE — Telephone Encounter (Signed)
Form placed in provider box. Katharina Caper, April D, [place]

## 2015-01-19 ENCOUNTER — Inpatient Hospital Stay (HOSPITAL_COMMUNITY)
Admission: EM | Admit: 2015-01-19 | Discharge: 2015-01-20 | DRG: 302 | Disposition: A | Discharge status: Other Acute Inpatient Hospital | Payer: Medicare Other | Attending: Family Medicine | Admitting: Family Medicine

## 2015-01-19 ENCOUNTER — Other Ambulatory Visit: Payer: Self-pay | Admitting: Cardiology

## 2015-01-19 ENCOUNTER — Ambulatory Visit
Admission: RE | Admit: 2015-01-19 | Discharge: 2015-01-19 | Disposition: A | Discharge status: Home / Self Care | Payer: Medicare Other | Source: Ambulatory Visit | Attending: Cardiology | Admitting: Cardiology

## 2015-01-19 ENCOUNTER — Encounter (HOSPITAL_COMMUNITY): Payer: Self-pay | Admitting: *Deleted

## 2015-01-19 DIAGNOSIS — Z885 Allergy status to narcotic agent status: Secondary | ICD-10-CM

## 2015-01-19 DIAGNOSIS — Z89421 Acquired absence of other right toe(s): Secondary | ICD-10-CM | POA: Diagnosis not present

## 2015-01-19 DIAGNOSIS — G8929 Other chronic pain: Secondary | ICD-10-CM | POA: Diagnosis not present

## 2015-01-19 DIAGNOSIS — Z8673 Personal history of transient ischemic attack (TIA), and cerebral infarction without residual deficits: Secondary | ICD-10-CM | POA: Diagnosis not present

## 2015-01-19 DIAGNOSIS — M179 Osteoarthritis of knee, unspecified: Secondary | ICD-10-CM | POA: Diagnosis present

## 2015-01-19 DIAGNOSIS — I739 Peripheral vascular disease, unspecified: Secondary | ICD-10-CM

## 2015-01-19 DIAGNOSIS — E1140 Type 2 diabetes mellitus with diabetic neuropathy, unspecified: Secondary | ICD-10-CM | POA: Diagnosis present

## 2015-01-19 DIAGNOSIS — L03116 Cellulitis of left lower limb: Secondary | ICD-10-CM | POA: Diagnosis not present

## 2015-01-19 DIAGNOSIS — Z7982 Long term (current) use of aspirin: Secondary | ICD-10-CM | POA: Diagnosis not present

## 2015-01-19 DIAGNOSIS — I70219 Atherosclerosis of native arteries of extremities with intermittent claudication, unspecified extremity: Secondary | ICD-10-CM | POA: Diagnosis present

## 2015-01-19 DIAGNOSIS — J449 Chronic obstructive pulmonary disease, unspecified: Secondary | ICD-10-CM | POA: Diagnosis present

## 2015-01-19 DIAGNOSIS — I998 Other disorder of circulatory system: Secondary | ICD-10-CM

## 2015-01-19 DIAGNOSIS — I120 Hypertensive chronic kidney disease with stage 5 chronic kidney disease or end stage renal disease: Secondary | ICD-10-CM | POA: Diagnosis not present

## 2015-01-19 DIAGNOSIS — Z888 Allergy status to other drugs, medicaments and biological substances status: Secondary | ICD-10-CM | POA: Diagnosis not present

## 2015-01-19 DIAGNOSIS — I96 Gangrene, not elsewhere classified: Secondary | ICD-10-CM | POA: Insufficient documentation

## 2015-01-19 DIAGNOSIS — M79605 Pain in left leg: Secondary | ICD-10-CM | POA: Diagnosis not present

## 2015-01-19 DIAGNOSIS — N529 Male erectile dysfunction, unspecified: Secondary | ICD-10-CM | POA: Diagnosis present

## 2015-01-19 DIAGNOSIS — Z95820 Peripheral vascular angioplasty status with implants and grafts: Secondary | ICD-10-CM

## 2015-01-19 DIAGNOSIS — F17210 Nicotine dependence, cigarettes, uncomplicated: Secondary | ICD-10-CM | POA: Diagnosis not present

## 2015-01-19 DIAGNOSIS — M549 Dorsalgia, unspecified: Secondary | ICD-10-CM | POA: Diagnosis not present

## 2015-01-19 DIAGNOSIS — Z7902 Long term (current) use of antithrombotics/antiplatelets: Secondary | ICD-10-CM | POA: Diagnosis not present

## 2015-01-19 DIAGNOSIS — N186 End stage renal disease: Secondary | ICD-10-CM | POA: Diagnosis present

## 2015-01-19 DIAGNOSIS — Z79891 Long term (current) use of opiate analgesic: Secondary | ICD-10-CM | POA: Diagnosis not present

## 2015-01-19 DIAGNOSIS — D72829 Elevated white blood cell count, unspecified: Secondary | ICD-10-CM

## 2015-01-19 DIAGNOSIS — M79672 Pain in left foot: Secondary | ICD-10-CM | POA: Diagnosis present

## 2015-01-19 DIAGNOSIS — Z79899 Other long term (current) drug therapy: Secondary | ICD-10-CM | POA: Diagnosis not present

## 2015-01-19 DIAGNOSIS — Z992 Dependence on renal dialysis: Secondary | ICD-10-CM

## 2015-01-19 DIAGNOSIS — D631 Anemia in chronic kidney disease: Secondary | ICD-10-CM | POA: Diagnosis not present

## 2015-01-19 DIAGNOSIS — I779 Disorder of arteries and arterioles, unspecified: Secondary | ICD-10-CM | POA: Diagnosis present

## 2015-01-19 DIAGNOSIS — Z8249 Family history of ischemic heart disease and other diseases of the circulatory system: Secondary | ICD-10-CM | POA: Diagnosis not present

## 2015-01-19 DIAGNOSIS — I6529 Occlusion and stenosis of unspecified carotid artery: Secondary | ICD-10-CM | POA: Diagnosis present

## 2015-01-19 DIAGNOSIS — Z9889 Other specified postprocedural states: Secondary | ICD-10-CM

## 2015-01-19 DIAGNOSIS — E1122 Type 2 diabetes mellitus with diabetic chronic kidney disease: Secondary | ICD-10-CM | POA: Diagnosis present

## 2015-01-19 DIAGNOSIS — J45909 Unspecified asthma, uncomplicated: Secondary | ICD-10-CM | POA: Diagnosis present

## 2015-01-19 DIAGNOSIS — Z794 Long term (current) use of insulin: Secondary | ICD-10-CM

## 2015-01-19 DIAGNOSIS — E1129 Type 2 diabetes mellitus with other diabetic kidney complication: Secondary | ICD-10-CM | POA: Diagnosis not present

## 2015-01-19 DIAGNOSIS — Z833 Family history of diabetes mellitus: Secondary | ICD-10-CM

## 2015-01-19 DIAGNOSIS — I340 Nonrheumatic mitral (valve) insufficiency: Secondary | ICD-10-CM | POA: Diagnosis not present

## 2015-01-19 DIAGNOSIS — I70229 Atherosclerosis of native arteries of extremities with rest pain, unspecified extremity: Secondary | ICD-10-CM

## 2015-01-19 DIAGNOSIS — E1151 Type 2 diabetes mellitus with diabetic peripheral angiopathy without gangrene: Secondary | ICD-10-CM | POA: Diagnosis present

## 2015-01-19 DIAGNOSIS — E785 Hyperlipidemia, unspecified: Secondary | ICD-10-CM | POA: Diagnosis not present

## 2015-01-19 LAB — CBC WITH DIFFERENTIAL/PLATELET
BASOS PCT: 0 % (ref 0–1)
Basophils Absolute: 0 10*3/uL (ref 0.0–0.1)
EOS ABS: 0.3 10*3/uL (ref 0.0–0.7)
Eosinophils Relative: 2 % (ref 0–5)
HCT: 27 % — ABNORMAL LOW (ref 39.0–52.0)
Hemoglobin: 8.7 g/dL — ABNORMAL LOW (ref 13.0–17.0)
Lymphocytes Relative: 12 % (ref 12–46)
Lymphs Abs: 1.7 10*3/uL (ref 0.7–4.0)
MCH: 31.3 pg (ref 26.0–34.0)
MCHC: 32.2 g/dL (ref 30.0–36.0)
MCV: 97.1 fL (ref 78.0–100.0)
MONOS PCT: 9 % (ref 3–12)
Monocytes Absolute: 1.2 10*3/uL — ABNORMAL HIGH (ref 0.1–1.0)
Neutro Abs: 10.6 10*3/uL — ABNORMAL HIGH (ref 1.7–7.7)
Neutrophils Relative %: 77 % (ref 43–77)
PLATELETS: 167 10*3/uL (ref 150–400)
RBC: 2.78 MIL/uL — ABNORMAL LOW (ref 4.22–5.81)
RDW: 14.7 % (ref 11.5–15.5)
WBC: 13.7 10*3/uL — ABNORMAL HIGH (ref 4.0–10.5)

## 2015-01-19 LAB — C-REACTIVE PROTEIN: CRP: 13.6 mg/dL — AB (ref <1.0)

## 2015-01-19 LAB — BASIC METABOLIC PANEL
Anion gap: 14 (ref 5–15)
BUN: 31 mg/dL — AB (ref 6–20)
CHLORIDE: 98 mmol/L — AB (ref 101–111)
CO2: 28 mmol/L (ref 22–32)
Calcium: 9.2 mg/dL (ref 8.9–10.3)
Creatinine, Ser: 6.6 mg/dL — ABNORMAL HIGH (ref 0.61–1.24)
GFR calc non Af Amer: 8 mL/min — ABNORMAL LOW (ref >60)
GFR, EST AFRICAN AMERICAN: 9 mL/min — AB (ref >60)
GLUCOSE: 125 mg/dL — AB (ref 65–99)
POTASSIUM: 3.7 mmol/L (ref 3.5–5.1)
Sodium: 140 mmol/L (ref 135–145)

## 2015-01-19 LAB — SEDIMENTATION RATE: Sed Rate: 105 mm/hr — ABNORMAL HIGH (ref 0–16)

## 2015-01-19 LAB — URIC ACID: Uric Acid, Serum: 4.3 mg/dL — ABNORMAL LOW (ref 4.4–7.6)

## 2015-01-19 MED ORDER — CLINDAMYCIN PHOSPHATE 600 MG/50ML IV SOLN
600.0000 mg | Freq: Once | INTRAVENOUS | Status: AC
  Administered 2015-01-19: 600 mg via INTRAVENOUS
  Filled 2015-01-19: qty 50

## 2015-01-19 MED ORDER — HYDROMORPHONE HCL 1 MG/ML IJ SOLN
1.0000 mg | Freq: Once | INTRAMUSCULAR | Status: AC
  Administered 2015-01-19: 1 mg via INTRAVENOUS
  Filled 2015-01-19: qty 1

## 2015-01-19 NOTE — ED Notes (Signed)
Prior to administrating antibiotics spoke with ED Doctor who stated to hold antibiotics until blood cultures are drawn. Doctor will ordered blood cultures x 2.

## 2015-01-19 NOTE — ED Notes (Signed)
Room changed on 5W.

## 2015-01-19 NOTE — ED Provider Notes (Signed)
CSN: [id number]     Arrival date & time 01/19/15  1713 History   First MD Initiated Contact with Patient 01/19/15 1837     Chief Complaint  Patient presents with  . Leg Pain     (Consider location/radiation/quality/duration/timing/severity/associated sxs/prior Treatment) Patient is a 59 y.o. male presenting with leg pain.  Leg Pain Associated symptoms: no back pain and no fever   Patient is a 59 year old male with past medical history COPD, diabetes, end-stage renal disease currently on Tuesday, Thursday, Saturday dialysis schedule who presents today with left foot pain is worsened or last 3 days. Patient has notably seen vascular surgeon at [facility] for peripheral artery disease. Partially a month ago he had a surgery where I believe stenting was performed for revascularization. Patient relates that he continued to have left leg pain and underwent a subsequent surgery 4 days ago and access was obtained through his right arm. They balloon stented his left lower extremity vessels in this certainly was noted to be without complication. Patient relates for the last 3 days he's had continuing worsening left foot and ankle pain. He relates this became increasingly red as well. He states that the pain is so severe that he has difficulty ambulating. Patient states that he typically ambulates with a cane however he has had to use his wife's walker due to the extent of pain that he's had. Patient denies any fevers or chills. He denies any increased shortness of breath nausea or vomiting. He has any abdominal pain, hematuria or dysuria. Patient does relate that he continues to make urine. He denies any diarrhea or bloody stools or dark tarry stools. Touching the left foot makes the pain significantly worse and keeping still  keeps the pain the pain at a minimum.  Past Medical History  Diagnosis Date  . COPD (chronic obstructive pulmonary disease)   . Chronic back pain     Chronic narcotics for this for many  years  . Diabetes mellitus   . Hyperlipidemia   . Hypertension   . Foot pain   . Peripheral vascular disease   . Asthma   . Stroke     No residual "Mini strokes"  . Ulcer 1985  . Fracture of foot     [place]- Right  . Neuromuscular disorder     periferal neuropathy  . Chronic kidney disease     CKD stage III   Past Surgical History  Procedure Laterality Date  . Neck surgery    . Eye surgery    . Cardiac catheterization    . Endarterectomy  12/18/2011    Procedure: ENDARTERECTOMY CAROTID;  Surgeon: Angelia Mould, MD;  Location: [place];  Service: Vascular;  Laterality: Left;  . Carotid endarterectomy    . Axillary-femoral bypass graft  06/18/2012    Procedure: BYPASS GRAFT AXILLA-BIFEMORAL;  Surgeon: Angelia Mould, MD;  Location: [place];  Service: Vascular;  Laterality: Right;  Right Axillo to Right Femoral Bypass Graft  . Femoral-popliteal bypass graft  06/18/2012    Procedure: BYPASS GRAFT FEMORAL-POPLITEAL ARTERY;  Surgeon: Angelia Mould, MD;  Location[facility];  Service: Vascular;  Laterality: Right;  Right Femoral to Above Knee Popliteal Bypass Graft  . Amputation  06/18/2012    Procedure: AMPUTATION DIGIT;  Surgeon: Angelia Mould, MD;  Location: [facility] OR;  Service: Vascular;  Laterality: Right;  Right side, amputation of fourth and fifth toes  . Av fistula placement  06/24/2012    Procedure: ARTERIOVENOUS (AV) FISTULA CREATION;  Surgeon: Angelia Mould, MD;  Location: [place];  Service: Vascular;  Laterality: Left;  . Lower extremity angiogram Right 11/21/2011    Procedure: LOWER EXTREMITY ANGIOGRAM;  Surgeon: Elam Dutch, MD;  Location: [facility] CATH LAB;  Service: Cardiovascular;  Laterality: Right;   Family History  Problem Relation Age of Onset  . Diabetes Mother   . Heart disease Mother   . Hypertension Mother   . Heart attack Mother   . Diabetes Father     Amputation  . Heart disease Father     Before age 23  . Hypertension Father   .  Heart attack Father   . Other Father     bleeding problems  . Diabetes Brother   . Heart disease Brother     Before age 54  . Hypertension Brother   . Heart attack Brother    Social History  Substance Use Topics  . Smoking status: Current Every Day Smoker -- 0.25 packs/day for 40 years    Types: Cigarettes    Last Attempt to Quit: 01/30/2014  . Smokeless tobacco: Never Used     Comment: 2-3 cigarettes a day  . Alcohol Use: No     Comment: Previous drinker - However, quit in 2006    Review of Systems  Constitutional: Negative for fever and chills.  HENT: Negative for congestion and rhinorrhea.   Eyes: Negative for photophobia and visual disturbance.  Respiratory: Negative for cough and shortness of breath.   Cardiovascular: Positive for leg swelling (left lower extremity). Negative for chest pain.  Gastrointestinal: Negative for nausea, vomiting and abdominal pain.  Genitourinary: Negative for dysuria and hematuria.       A she relates she still makes urine all that this is decreased.  Musculoskeletal: Positive for gait problem (secondary to exquisite pain and left foot). Negative for back pain.  Skin: Positive for color change (Redness to the left foot and ankle). Negative for rash.  Neurological: Negative for dizziness and light-headedness.  Psychiatric/Behavioral: Negative for confusion and agitation.  All other systems reviewed and are negative.     Allergies  Nicoderm; Gabapentin; and Vicodin  Home Medications   Prior to Admission medications   Medication Sig Start Date End Date Taking? Authorizing Provider  albuterol (PROVENTIL HFA;VENTOLIN HFA) 108 (90 BASE) MCG/ACT inhaler Inhale 1 puff into the lungs every 6 (six) hours as needed for wheezing or shortness of breath.   Yes Historical Provider, MD  Ascorbic Acid (VITAMIN C) 1000 MG tablet Take 1,000 mg by mouth daily.   Yes Historical Provider, MD  aspirin 81 MG chewable tablet Chew 81 mg by mouth every other day.     Yes Historical Provider, MD  cholecalciferol (VITAMIN D) 1000 UNITS tablet Take 1,000 Units by mouth daily.   Yes Historical Provider, MD  ethyl chloride spray Apply 1 application topically daily as needed (prior to dialysis). Use spray on arm prior to dialysis if unable to use gel one hour prior to dialysis. 04/22/14  Yes Historical Provider, MD  insulin lispro (HUMALOG) 100 UNIT/ML injection Inject 2-3 Units into the skin 2 (two) times daily as needed for high blood sugar (CBG >180). Per sliding scale: inject 2 units subcutaneously for CBG 180-299, inject 3 units for CBG >300 08/13/10  Yes Zenia Resides, MD  LANTUS 100 UNIT/ML injection INJECT 6 UNITS INTO THE SKIN DAILY Patient taking differently: INJECT 30 UNITS INTO THE SKIN BEFORE BREAKFAST IF NEEDED FOR CBG >140 10/30/14  Yes Clarise Cruz L  Nori Riis, MD  lidocaine-EPINEPHrine-tetracaine (LET) GEL Apply topically to left arm 1 hour before dialysis Patient taking differently: Apply topically daily as needed (prior to dialysis). Apply topically to left arm 1 hour before dialysis 03/20/14  Yes Vivi Barrack, MD  multivitamin (RENA-VIT) TABS tablet Take 1 tablet by mouth daily.  04/12/14  Yes Historical Provider, MD  oxyCODONE-acetaminophen (PERCOCET) 7.5-325 MG per tablet Take one by mouth four times a day as needed for pain do not fill before January 21 2015 Patient taking differently: Take 1 tablet by mouth every 6 (six) hours. Scheduled, do not fill before January 21 2015 01/05/15  Yes [name], MD  varenicline (CHANTIX) 1 MG tablet Take 1 mg by mouth daily.   Yes Historical Provider, MD  darbepoetin (ARANESP) 100 MCG/0.5ML SOLN Inject 0.5 mLs (100 mcg total) into the skin every Monday. 06/24/12   Timmothy Euler, MD   BP 167/77 mmHg  Pulse 81  Temp(Src) 98.4 F (36.9 C) (Oral)  Resp 16  Ht 5\' 9"  (1.753 m)  Wt 198 lb (89.812 kg)  BMI 29.23 kg/m2  SpO2 97% Physical Exam  Constitutional: He is oriented to person, place, and time. No distress.   HENT:  Head: Normocephalic and atraumatic.  Eyes: Conjunctivae and EOM are normal.  Neck: Normal range of motion. Neck supple.  Cardiovascular: Normal rate and regular rhythm.   Pulmonary/Chest: Effort normal and breath sounds normal. No respiratory distress. He has no wheezes.  Abdominal: Soft. Bowel sounds are normal.  Musculoskeletal: Normal range of motion. He exhibits edema (Mild edema to the left lower extremity) and tenderness (Exquisite tenderness with overlying erythema to the left foot and ankle. No crepitus to palpation).  Neurological: He is alert and oriented to person, place, and time.  Skin: Skin is warm. He is not diaphoretic. There is erythema (Left foot).  Psychiatric: He has a normal mood and affect. His behavior is normal.  Nursing note and vitals reviewed.   ED Course  Procedures (including critical care time) Labs Review Labs Reviewed  CBC WITH DIFFERENTIAL/PLATELET - Abnormal; Notable for the following:    WBC 13.7 (*)    RBC 2.78 (*)    Hemoglobin 8.7 (*)    HCT 27.0 (*)    Neutro Abs 10.6 (*)    Monocytes Absolute 1.2 (*)    All other components within normal limits  BASIC METABOLIC PANEL - Abnormal; Notable for the following:    Chloride 98 (*)    Glucose, Bld 125 (*)    BUN 31 (*)    Creatinine, Ser 6.60 (*)    GFR calc non Af Amer 8 (*)    GFR calc Af Amer 9 (*)    All other components within normal limits  URIC ACID - Abnormal; Notable for the following:    Uric Acid, Serum 4.3 (*)    All other components within normal limits  SEDIMENTATION RATE - Abnormal; Notable for the following:    Sed Rate 105 (*)    All other components within normal limits  C-REACTIVE PROTEIN - Abnormal; Notable for the following:    CRP 13.6 (*)    All other components within normal limits  CULTURE, BLOOD (ROUTINE X 2)  CULTURE, BLOOD (ROUTINE X 2)    Imaging Review US Arterial Seg Multiple  01/19/2015   CLINICAL DATA:  59 year old male with a history of prior  vascular stenting/ surgery.  Cardiovascular risk factors include diabetes, known vascular disease, smoking.  EXAM:  NONINVASIVE PHYSIOLOGIC VASCULAR STUDY OF BILATERAL LOWER EXTREMITIES  TECHNIQUE: Evaluation of both lower extremities was performed at rest, including calculation of ankle-brachial indices, multiple segmental pressure evaluation, segmental Doppler and segmental pulse volume recording.  COMPARISON:  None.  FINDINGS: Right:  Resting ankle brachial index:  Noncompressible vessels.  Segmental blood pressure: Brachial artery measures 160 mm Hg. Noncompressible vessels at the ankle.  Doppler: Segmental Doppler of the right lower extremity demonstrates monophasic waveform of the femoral artery popliteal artery, posterior tibial artery, and dorsalis pedis.  Pulse volume recording: Segmental PVR of the right lower extremity demonstrates loss of augmentation from high thigh to below knee with deterioration of the waveform at the ankle and right great toe.  Left:  Resting ankle brachial index: 0.55  Segmental blood pressure: Upper extremity pressure not measured as the patient has a dialysis graft. Noncompressible PT ankle vessels.  Doppler: Segmental Doppler of the left lower extremity demonstrates biphasic femoral artery, with monophasic popliteal artery, posterior tibial artery, and dorsalis pedis.  Pulse volume recording: Segmental PVR of the left lower extremity demonstrates loss of augmentation from high thigh to below knee, with deterioration of the waveform at the ankle and left great toe.  Additional:  IMPRESSION: Right:  Ankle brachial index is not measurable, with noncompressible vessels at the ankle, however, remainder of the study demonstrates severe arterial occlusive disease, with abnormal iliac inflow, as well as femoral popliteal and tibial disease.  Left:  Resting ankle-brachial index demonstrates moderate arterial occlusive disease, though this may be spurious as there are noncompressible  vessels of the left lower extremity.  Remainder of the study demonstrates multilevel arterial occlusive disease, including abnormal iliac inflow, as well as femoral popliteal and tibial disease.  Signed,  Dulcy Fanny. Earleen Newport, DO  [facility]  [facility], [facility], [facility]   Electronically Signed   By: Corrie Mckusick D.O.   On: 01/19/2015 17:57   I, Theodosia Quay, personally reviewed and evaluated these images and lab results as part of my medical decision-making.   EKG Interpretation None      MDM   Final diagnoses:  None    Patient is a 59 year old male with past medical history COPD, diabetes, end-stage renal disease currently on Tuesday, Thursday, Saturday dialysis schedule who presents today with left foot pain is worsened or last 3 days. Patient has pulses which are found by Doppler of the left lower extremity. Patient is exquisitely tender and there is overlying erythema. No crepitus to palpation. Patient likely has cellulitis affecting the left foot. He also has notable leukocytosis as well. Given the level of pain that he's having in addition to his numerous comorbidities I discussed with medicine who will admit him for further management. Started the patient on clindamycin IV here. Pain was controlled without wanted. Patient was stable at the time of transfer to the floor.    Theodosia Quay, MD 01/20/15 [id number]  Leonard Schwartz, MD 01/20/15 [phone]

## 2015-01-19 NOTE — ED Notes (Signed)
Phlebotomy at bedside.

## 2015-01-19 NOTE — ED Notes (Signed)
Patient states he had surgery on his left leg and foot on Monday.  He states he developed cramps in his left leg on Tuesday.  He states the pain has gotten worse.  Patient called his md and sent to [facility] and he had test completed.  He is unsure of results. Patient is here for pain medications.  He states he is seen by Md in [place]

## 2015-01-19 NOTE — ED Notes (Signed)
Blood cultures x2 completed.

## 2015-01-19 NOTE — H&P (Signed)
[facility] Admission History and Physical Service Pager: [phone]  Patient name: John [name] Medical record number: [id number] Date of birth: [birth year] Age: 59 y.o. Gender: male  Primary Care Provider: [name], MD Consultants: none Code Status: Full  Chief Complaint: left foot pain  Assessment and Plan: John [name] is a 59 y.o. male presenting with left foot pain. PMH is significant for severe PVD s/p recent revascularization procedures on 7/25 and 8/8, HTN, HLD, COPD, T2DM, chronic back pain on opioid, TIA, ESRD on HD TTS, tobacco abuse  # Left foot pain: suspected cellulitis vs ischemic foot. Known vasculopath and followed at [facility], had initial revascularization of left extremity on 7/25 with subsequent revision 8/8 because he had developed severe left LE pain (review of Care Everywhere shows there was successful PTA of left distal popliteal, TP trunk, AT.Marland Kitchen). Left foot is quite tender to palpation right now, with some erythema but cool to touch bottom of his forefoot and toes. There were dopplerable pulses of the forefoot. Arterial U/S in ED showed left ABI 0.55, occlusive disease in iliac, femoral, popliteal, tibial arteries. DDx would include cellulitis, however this most likely is a PAD etiology for his pain. - 1mg  dilaudid IV q2hrs PRN - plavix 75mg  daily - blood cultures were ordered in ED - will continue clindamycin overnight for mod-severe cellulitis - consult VVS in AM, do not feel he has emergent need for revasc currently  # ESRD: sees Dr. Moshe Cipro, schedule TTS - consult nephro in the AM for HD  # HTN: controlled, not on anti-HTN meds - monitor  # HLD: did not come in on statin - start lipitor 40mg   # T2DM:  - lantus 5 units (reports as 30 units) - SSI, CBG qAC qHS  FEN/GI: diet carb mod/HH / saline lock Prophylaxis: heparin sq  Disposition: admit  History of Present Illness: John [name] is a 59 y.o. male presenting with  left foot pain. Patient states he recently underwent a procedure this past Monday where a stent was removed from his left ankle and ballooned, and a stent was put in a vessel near his left knee.  He states he did well after the procedure until Tuesday night when he started getting pain. He has stated the pain has been getting progressively worse. Pain is worse on the bottom and outside of his left foot. He has noticed some swelling of the area. He denies any numbness/tingling, just a "throbbing" pain that his home percocet was not able to control so he thus came to the ED. He denies stepping on anything with that foot, says he wears shows exclusively, denies fevers, chills, nausea, vomiting. He reports quitting smoking 1 week ago.  Care everywhere: 01/01/15 procedure: PTA of an 80% left common iliac to less than 20% with a 7 x 40 Bard drug-coated balloon, PTA of an 80% left external iliac to less than 20% with a 7 x 40 Bard drug-coated balloon, PTA and stenting of a left common femoral artery 100% to less than 20% with a 6.5 x 60 mm Supera self-expanding stent, and PTA and stenting of a left SFA 100% to less than 20% with two 6 x 150 Bard DCBs and a 6 x 120 Flex stent.  01/15/15 procedure: 1. Successful PTA of the left distal popliteal 100% to <20% with a 4x60 balloon 2. Successful PTA of the left TP trunk 100% to <20% with a 4x60 balloon 3. Successful PTA of the left AT 100%  to <20% with a 3x120 balloon   Review Of Systems: Per HPI with the following additions: no fever/chill, no n/v, no CP, no SOB, no abdominal pain, no numbness/tingling of LE Otherwise 12 point review of systems was performed and was unremarkable.  Patient Active Problem List   Diagnosis Date Noted  . Cellulitis of left foot 01/19/2015  . Fall against object 07/03/2014  . Knee gives out 07/03/2014  . Left upper arm pain 03/20/2014  . Encounter for chronic pain management 03/10/2014  . Peripheral vascular disease, unspecified  03/08/2014  . Aftercare following surgery of the circulatory system, [place] 03/08/2014  . Fecal incontinence 03/23/2013  . PVD (peripheral vascular disease) 07/28/2012  . hand pain , steal syndrome LEFT 07/28/2012  . Mitral valve regurgitation 07/05/2012  . Hyperparathyroidism, secondary renal 07/05/2012  . Anemia of renal disease 07/05/2012  . Cervical myelopathy 07/05/2012  . Occlusion and stenosis of carotid artery without mention of cerebral infarction 12/03/2011  . Atherosclerosis of native arteries of the extremities with intermittent claudication 11/12/2011  . Right carotid bruit 11/12/2011  . Knee pain, left 08/08/2011  . COPD (chronic obstructive pulmonary disease) 01/11/2011  . BACK PAIN, CHRONIC 12/15/2008  . ESRD (end stage renal disease) on dialysis 01/21/2008  . NEUROPATHY, DIABETIC 08/06/2006  . Diabetes mellitus with renal complications 68/05/7516  . HYPERLIPIDEMIA 08/06/2006  . TOBACCO DEPENDENCE 08/06/2006  . HYPERTENSION, BENIGN SYSTEMIC 08/06/2006  . 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 OSTEOARTHRITIS, LOWER LEG 08/06/2006  . CEREBROVASCULAR ACCIDENT, HX OF 08/06/2006   Past Medical History: Past Medical History  Diagnosis Date  . COPD (chronic obstructive pulmonary disease)   . Chronic back pain     Chronic narcotics for this for many years  . Diabetes mellitus   . Hyperlipidemia   . Hypertension   . Foot pain   . Peripheral vascular disease   . Asthma   . Stroke     No residual "Mini strokes"  . Ulcer 1985  . Fracture of foot     [place]- Right  . Neuromuscular disorder     periferal neuropathy  . Chronic kidney disease     CKD stage III   Past Surgical History: Past Surgical History  Procedure Laterality Date  . Neck surgery    . Eye surgery    . Cardiac catheterization    . Endarterectomy  12/18/2011    Procedure: ENDARTERECTOMY CAROTID;  Surgeon: Angelia Mould, MD;  Location: [place];  Service: Vascular;  Laterality: Left;  . Carotid  endarterectomy    . Axillary-femoral bypass graft  06/18/2012    Procedure: BYPASS GRAFT AXILLA-BIFEMORAL;  Surgeon: Angelia Mould, MD;  Location: [place];  Service: Vascular;  Laterality: Right;  Right Axillo to Right Femoral Bypass Graft  . Femoral-popliteal bypass graft  06/18/2012    Procedure: BYPASS GRAFT FEMORAL-POPLITEAL ARTERY;  Surgeon: Angelia Mould, MD;  Location: [facility] OR;  Service: Vascular;  Laterality: Right;  Right Femoral to Above Knee Popliteal Bypass Graft  . Amputation  06/18/2012    Procedure: AMPUTATION DIGIT;  Surgeon: Angelia Mould, MD;  [facility];  Service: Vascular;  Laterality: Right;  Right side, amputation of fourth and fifth toes  . Av fistula placement  06/24/2012    Procedure: ARTERIOVENOUS (AV) FISTULA CREATION;  Surgeon: Angelia Mould, MD;  Location: [place];  Service: Vascular;  Laterality: Left;  . Lower extremity angiogram Right 11/21/2011    Procedure: LOWER EXTREMITY ANGIOGRAM;  Surgeon: Elam Dutch, MD;  Location: [place] CATH LAB;  Service: Cardiovascular;  Laterality: Right;   Social History: Social History  Substance Use Topics  . Smoking status: Current Every Day Smoker -- 0.25 packs/day for 40 years    Types: Cigarettes    Last Attempt to Quit: 01/30/2014  . Smokeless tobacco: Never Used     Comment: 2-3 cigarettes a day  . Alcohol Use: No     Comment: Previous drinker - However, quit in 2006   Additional social history: none  Please also refer to relevant sections of EMR.  Family History: Family History  Problem Relation Age of Onset  . Diabetes Mother   . Heart disease Mother   . Hypertension Mother   . Heart attack Mother   . Diabetes Father     Amputation  . Heart disease Father     Before age 32  . Hypertension Father   . Heart attack Father   . Other Father     bleeding problems  . Diabetes Brother   . Heart disease Brother     Before age 51  . Hypertension Brother   . Heart attack Brother     Allergies and Medications: Allergies  Allergen Reactions  . Nicoderm [Nicotine] Itching and Rash  . Gabapentin Other (See Comments)    Can't walk, shuts legs down  . Vicodin [Hydrocodone-Acetaminophen] Hives and Itching   Current Facility-Administered Medications on File Prior to Encounter  Medication Dose Route Frequency Provider Last Rate Last Dose  . ferumoxytol (FERAHEME) 1,020 mg in sodium chloride 0.9 % 100 mL IVPB  1,020 mg Intravenous Once Corliss Parish, MD       Current Outpatient Prescriptions on File Prior to Encounter  Medication Sig Dispense Refill  . aspirin 81 MG chewable tablet Chew 81 mg by mouth every other day.     . ethyl chloride spray Apply 1 application topically daily as needed (prior to dialysis). Use spray on arm prior to dialysis if unable to use gel one hour prior to dialysis.    Marland Kitchen insulin lispro (HUMALOG) 100 UNIT/ML injection Inject 2-3 Units into the skin 2 (two) times daily as needed for high blood sugar (CBG >180). Per sliding scale: inject 2 units subcutaneously for CBG 180-299, inject 3 units for CBG >300    . LANTUS 100 UNIT/ML injection INJECT 6 UNITS INTO THE SKIN DAILY (Patient taking differently: INJECT 30 UNITS INTO THE SKIN BEFORE BREAKFAST IF NEEDED FOR CBG >140) 30 mL 0  . lidocaine-EPINEPHrine-tetracaine (LET) GEL Apply topically to left arm 1 hour before dialysis (Patient taking differently: Apply topically daily as needed (prior to dialysis). Apply topically to left arm 1 hour before dialysis) 1 Syringe 2  . multivitamin (RENA-VIT) TABS tablet Take 1 tablet by mouth daily.     Marland Kitchen oxyCODONE-acetaminophen (PERCOCET) 7.5-325 MG per tablet Take one by mouth four times a day as needed for pain do not fill before January 21 2015 (Patient taking differently: Take 1 tablet by mouth every 6 (six) hours. Scheduled, do not fill before January 21 2015) 120 tablet 0  . darbepoetin (ARANESP) 100 MCG/0.5ML SOLN Inject 0.5 mLs (100 mcg total) into the skin  every Monday. 4.2 mL 0  . [DISCONTINUED] tiotropium (SPIRIVA HANDIHALER) 18 MCG inhalation capsule Place 1 capsule (18 mcg total) into inhaler and inhale daily. 30 capsule 2    Objective: BP 167/77 mmHg  Pulse 81  Temp(Src) 98.4 F (36.9 C) (Oral)  Resp 16  Ht 5\' 9"  (1.753 m)  Wt 198 lb (89.812 kg)  BMI 29.23 kg/m2  SpO2 97% Exam: General: NAD Eyes: PERRL, EOMI. ENTM: no oropharyngeal lesions, MMM. Neck: supple, normal Cardiovascular: RRR, normal s1s2, no mrg. 2+ radial pulses bilaterally, non-palpable DP and PT pulses bilaterally, popliteal pulses thready bilaterally. Left arm AVF palpable thrill. Respiratory: CTAB, nl effort Abdomen: soft, NTND normal BS MSK: right foot and toes are warm. Left plantar forefoot and toes are cool to touch. Very painful to light touch over all aspects of the foot up to the ankles. Able to move all 5 toes Skin: mild erythema and swelling over dorsum left forefoot, left anterior shin, mildly warm to touch Neuro: alert and oriented, no deficits noted Psych: normal mood/affect, normal thought content and speech.  Labs and Imaging: CBC BMET   Recent Labs Lab 01/19/15 1948  WBC 13.7*  HGB 8.7*  HCT 27.0*  PLT 167    Recent Labs Lab 01/19/15 1948  NA 140  K 3.7  CL 98*  CO2 28  BUN 31*  CREATININE 6.60*  GLUCOSE 125*  CALCIUM 9.2     US Arterial Seg Multiple  01/19/2015   CLINICAL DATA:  59 year old male with a history of prior vascular stenting/ surgery.  Cardiovascular risk factors include diabetes, known vascular disease, smoking.  EXAM: NONINVASIVE PHYSIOLOGIC VASCULAR STUDY OF BILATERAL LOWER EXTREMITIES  TECHNIQUE: Evaluation of both lower extremities was performed at rest, including calculation of ankle-brachial indices, multiple segmental pressure evaluation, segmental Doppler and segmental pulse volume recording.  COMPARISON:  None.  FINDINGS: Right:  Resting ankle brachial index:  Noncompressible vessels.  Segmental blood  pressure: Brachial artery measures 160 mm Hg. Noncompressible vessels at the ankle.  Doppler: Segmental Doppler of the right lower extremity demonstrates monophasic waveform of the femoral artery popliteal artery, posterior tibial artery, and dorsalis pedis.  Pulse volume recording: Segmental PVR of the right lower extremity demonstrates loss of augmentation from high thigh to below knee with deterioration of the waveform at the ankle and right great toe.  Left:  Resting ankle brachial index: 0.55  Segmental blood pressure: Upper extremity pressure not measured as the patient has a dialysis graft. Noncompressible PT ankle vessels.  Doppler: Segmental Doppler of the left lower extremity demonstrates biphasic femoral artery, with monophasic popliteal artery, posterior tibial artery, and dorsalis pedis.  Pulse volume recording: Segmental PVR of the left lower extremity demonstrates loss of augmentation from high thigh to below knee, with deterioration of the waveform at the ankle and left great toe.  Additional:  IMPRESSION: Right:  Ankle brachial index is not measurable, with noncompressible vessels at the ankle, however, remainder of the study demonstrates severe arterial occlusive disease, with abnormal iliac inflow, as well as femoral popliteal and tibial disease.  Left:  Resting ankle-brachial index demonstrates moderate arterial occlusive disease, though this may be spurious as there are noncompressible vessels of the left lower extremity.  Remainder of the study demonstrates multilevel arterial occlusive disease, including abnormal iliac inflow, as well as femoral popliteal and tibial disease.  Signed,  Dulcy Fanny. Earleen Newport, DO  [facility]  [facility]   Electronically Signed   By: Corrie Mckusick D.O.   On: 01/19/2015 17:57     Leone Brand, MD 01/19/2015, 10:09 PM PGY-3, [place] [facility] pager: [phone], text pages welcome

## 2015-01-20 ENCOUNTER — Encounter (HOSPITAL_COMMUNITY): Payer: Self-pay

## 2015-01-20 DIAGNOSIS — I96 Gangrene, not elsewhere classified: Secondary | ICD-10-CM | POA: Diagnosis not present

## 2015-01-20 DIAGNOSIS — I739 Peripheral vascular disease, unspecified: Secondary | ICD-10-CM | POA: Diagnosis not present

## 2015-01-20 DIAGNOSIS — J449 Chronic obstructive pulmonary disease, unspecified: Secondary | ICD-10-CM | POA: Diagnosis not present

## 2015-01-20 DIAGNOSIS — I120 Hypertensive chronic kidney disease with stage 5 chronic kidney disease or end stage renal disease: Secondary | ICD-10-CM | POA: Diagnosis not present

## 2015-01-20 DIAGNOSIS — N186 End stage renal disease: Secondary | ICD-10-CM

## 2015-01-20 DIAGNOSIS — Z8673 Personal history of transient ischemic attack (TIA), and cerebral infarction without residual deficits: Secondary | ICD-10-CM | POA: Diagnosis not present

## 2015-01-20 DIAGNOSIS — Z9989 Dependence on other enabling machines and devices: Secondary | ICD-10-CM | POA: Diagnosis not present

## 2015-01-20 DIAGNOSIS — M79672 Pain in left foot: Secondary | ICD-10-CM

## 2015-01-20 DIAGNOSIS — Z89421 Acquired absence of other right toe(s): Secondary | ICD-10-CM | POA: Diagnosis not present

## 2015-01-20 DIAGNOSIS — I10 Essential (primary) hypertension: Secondary | ICD-10-CM | POA: Diagnosis not present

## 2015-01-20 DIAGNOSIS — F17210 Nicotine dependence, cigarettes, uncomplicated: Secondary | ICD-10-CM | POA: Diagnosis not present

## 2015-01-20 DIAGNOSIS — M2550 Pain in unspecified joint: Secondary | ICD-10-CM | POA: Diagnosis not present

## 2015-01-20 DIAGNOSIS — Z992 Dependence on renal dialysis: Secondary | ICD-10-CM | POA: Diagnosis not present

## 2015-01-20 DIAGNOSIS — J45909 Unspecified asthma, uncomplicated: Secondary | ICD-10-CM | POA: Diagnosis not present

## 2015-01-20 DIAGNOSIS — M898X9 Other specified disorders of bone, unspecified site: Secondary | ICD-10-CM | POA: Diagnosis not present

## 2015-01-20 DIAGNOSIS — E785 Hyperlipidemia, unspecified: Secondary | ICD-10-CM | POA: Diagnosis not present

## 2015-01-20 DIAGNOSIS — I7389 Other specified peripheral vascular diseases: Secondary | ICD-10-CM | POA: Diagnosis not present

## 2015-01-20 DIAGNOSIS — E119 Type 2 diabetes mellitus without complications: Secondary | ICD-10-CM | POA: Diagnosis not present

## 2015-01-20 DIAGNOSIS — I998 Other disorder of circulatory system: Secondary | ICD-10-CM | POA: Diagnosis not present

## 2015-01-20 DIAGNOSIS — I70222 Atherosclerosis of native arteries of extremities with rest pain, left leg: Secondary | ICD-10-CM | POA: Diagnosis not present

## 2015-01-20 DIAGNOSIS — L03116 Cellulitis of left lower limb: Secondary | ICD-10-CM | POA: Diagnosis not present

## 2015-01-20 DIAGNOSIS — E1129 Type 2 diabetes mellitus with other diabetic kidney complication: Secondary | ICD-10-CM | POA: Diagnosis not present

## 2015-01-20 DIAGNOSIS — L03818 Cellulitis of other sites: Secondary | ICD-10-CM | POA: Diagnosis not present

## 2015-01-20 DIAGNOSIS — D631 Anemia in chronic kidney disease: Secondary | ICD-10-CM | POA: Diagnosis not present

## 2015-01-20 DIAGNOSIS — Z794 Long term (current) use of insulin: Secondary | ICD-10-CM | POA: Diagnosis not present

## 2015-01-20 DIAGNOSIS — E118 Type 2 diabetes mellitus with unspecified complications: Secondary | ICD-10-CM | POA: Diagnosis not present

## 2015-01-20 LAB — BASIC METABOLIC PANEL
ANION GAP: 16 — AB (ref 5–15)
BUN: 35 mg/dL — AB (ref 6–20)
CHLORIDE: 96 mmol/L — AB (ref 101–111)
CO2: 27 mmol/L (ref 22–32)
Calcium: 8.5 mg/dL — ABNORMAL LOW (ref 8.9–10.3)
Creatinine, Ser: 7.72 mg/dL — ABNORMAL HIGH (ref 0.61–1.24)
GFR calc non Af Amer: 7 mL/min — ABNORMAL LOW (ref >60)
GFR, EST AFRICAN AMERICAN: 8 mL/min — AB (ref >60)
Glucose, Bld: 118 mg/dL — ABNORMAL HIGH (ref 65–99)
Potassium: 3.8 mmol/L (ref 3.5–5.1)
Sodium: 139 mmol/L (ref 135–145)

## 2015-01-20 LAB — GLUCOSE, CAPILLARY
GLUCOSE-CAPILLARY: 102 mg/dL — AB (ref 65–99)
Glucose-Capillary: 83 mg/dL (ref 65–99)
Glucose-Capillary: 93 mg/dL (ref 65–99)

## 2015-01-20 MED ORDER — ONDANSETRON HCL 4 MG/2ML IJ SOLN: 4.0000 mg | Freq: Four times a day (QID) | INTRAMUSCULAR | Status: DC | PRN

## 2015-01-20 MED ORDER — CLOPIDOGREL BISULFATE 75 MG PO TABS
75.0000 mg | ORAL_TABLET | Freq: Every day | ORAL | Status: DC
  Administered 2015-01-20: 75 mg via ORAL
  Filled 2015-01-20: qty 1

## 2015-01-20 MED ORDER — INSULIN GLARGINE 100 UNIT/ML ~~LOC~~ SOLN
5.0000 [IU] | Freq: Every day | SUBCUTANEOUS | Status: DC
  Filled 2015-01-20 (×2): qty 0.05

## 2015-01-20 MED ORDER — HYDROMORPHONE HCL 1 MG/ML IJ SOLN
1.0000 mg | INTRAMUSCULAR | Status: DC | PRN
Start: 2015-01-20 — End: 2015-01-20
  Administered 2015-01-20 (×4): 1 mg via INTRAVENOUS
  Filled 2015-01-20 (×4): qty 1

## 2015-01-20 MED ORDER — HEPARIN SODIUM (PORCINE) 5000 UNIT/ML IJ SOLN
5000.0000 [IU] | Freq: Three times a day (TID) | INTRAMUSCULAR | Status: DC
  Administered 2015-01-20 (×3): 5000 [IU] via SUBCUTANEOUS
  Filled 2015-01-20 (×3): qty 1

## 2015-01-20 MED ORDER — INSULIN ASPART 100 UNIT/ML ~~LOC~~ SOLN: 0.0000 [IU] | Freq: Three times a day (TID) | SUBCUTANEOUS | Status: DC

## 2015-01-20 MED ORDER — CALCITRIOL 0.25 MCG PO CAPS: 1.7500 ug | ORAL_CAPSULE | ORAL | Status: DC

## 2015-01-20 MED ORDER — LANTHANUM CARBONATE 500 MG PO CHEW
2000.0000 mg | CHEWABLE_TABLET | Freq: Three times a day (TID) | ORAL | Status: DC
  Administered 2015-01-20: 2000 mg via ORAL
  Filled 2015-01-20: qty 4

## 2015-01-20 MED ORDER — CLINDAMYCIN PHOSPHATE 600 MG/50ML IV SOLN
600.0000 mg | Freq: Three times a day (TID) | INTRAVENOUS | Status: DC
  Administered 2015-01-20 (×3): 600 mg via INTRAVENOUS
  Filled 2015-01-20 (×3): qty 50

## 2015-01-20 MED ORDER — ATORVASTATIN CALCIUM 40 MG PO TABS: 40.0000 mg | ORAL_TABLET | Freq: Every day | ORAL | Status: DC

## 2015-01-20 MED ORDER — LANTHANUM CARBONATE 500 MG PO CHEW: 2000.0000 mg | CHEWABLE_TABLET | Freq: Three times a day (TID) | ORAL | Status: DC

## 2015-01-20 MED ORDER — ACETAMINOPHEN 650 MG RE SUPP: 650.0000 mg | Freq: Four times a day (QID) | RECTAL | Status: DC | PRN

## 2015-01-20 MED ORDER — LANTHANUM CARBONATE 500 MG PO CHEW
1000.0000 mg | CHEWABLE_TABLET | Freq: Two times a day (BID) | ORAL | Status: DC
  Filled 2015-01-20: qty 2

## 2015-01-20 MED ORDER — ACETAMINOPHEN 325 MG PO TABS: 650.0000 mg | ORAL_TABLET | Freq: Four times a day (QID) | ORAL | Status: DC | PRN

## 2015-01-20 MED ORDER — ONDANSETRON HCL 4 MG PO TABS: 4.0000 mg | ORAL_TABLET | Freq: Four times a day (QID) | ORAL | Status: DC | PRN

## 2015-01-20 MED ORDER — RENA-VITE PO TABS: 1.0000 | ORAL_TABLET | Freq: Every day | ORAL | Status: DC

## 2015-01-20 NOTE — Consult Note (Signed)
[facility] [place] HISTORY AND PHYSICAL   History of Present Illness:  Patient is a 59 y.o. year old male who presents for evaluation of left foot ischemia and cellulitis.  Pt had complicated endovascular procedure in 2 stages at [facility] by Dr Andree Elk 7/25 PTA of left common iliac, left external iliac and stenting of left common femoral and left superficial femoral arteries. He continued to complain of pain in his left foot and was taken back for an additional procedure on 01/15/15 where he underwent atherectomy and PTA of his left popliteal tibioperoneal trunk anterior tibial and posterior tibial arteries.  The patient continued to have pain and was offered evaluation at [place] but stated he could not drive himself there so he came to [facility]  He continues to have pain in his left foot. He was seen at [place] yesterday and had non invasive testing which showed ABI of 0.55 but probably falsely elevated secondary to calcification.  Biphasic femoral suggesting iliac stenosis, monophasic popliteal suggesting high grade SFA stenosis or occlusion.  Right side was also non compressible.  He currently has no right side symptoms but previously underwent a right ax bifem and right fem pop by my partner Dr Scot Dock 2014.  He has also had CEA and left arm fistula placed by Dr Scot Dock in the past.  Pt was not satisfied with Dr Nicole Cella opinion of just watching the left leg since it was not currently threatened.  This was in June of this year.  Pt went to see Dr Andree Elk for second opinion.  Other medical problems include COPD, DM, chronic back pain, hyperlipidemia, hypertension, prior stroke all of which are currently stable.  He has ESRD and dialysis on T Th Sat.  He is still smoking. He is currently on Plavix and a statin.  Past Medical History  Diagnosis Date  . COPD (chronic obstructive pulmonary disease)   . Chronic back pain     Chronic narcotics for this for many years  . Diabetes  mellitus   . Hyperlipidemia   . Hypertension   . Foot pain   . Peripheral vascular disease   . Asthma   . Stroke     No residual "Mini strokes"  . Ulcer 1985  . Fracture of foot     [place]- Right  . Neuromuscular disorder     periferal neuropathy  . Chronic kidney disease     CKD stage III    Past Surgical History  Procedure Laterality Date  . Neck surgery    . Eye surgery    . Cardiac catheterization    . Endarterectomy  12/18/2011    Procedure: ENDARTERECTOMY CAROTID;  Surgeon: Angelia Mould, MD;  Location: [place];  Service: Vascular;  Laterality: Left;  . Carotid endarterectomy    . Axillary-femoral bypass graft  06/18/2012    Procedure: BYPASS GRAFT AXILLA-BIFEMORAL;  Surgeon: Angelia Mould, MD;  Location: [place];  Service: Vascular;  Laterality: Right;  Right Axillo to Right Femoral Bypass Graft  . Femoral-popliteal bypass graft  06/18/2012    Procedure: BYPASS GRAFT FEMORAL-POPLITEAL ARTERY;  Surgeon: Angelia Mould, MD;  Location: [facility] OR;  Service: Vascular;  Laterality: Right;  Right Femoral to Above Knee Popliteal Bypass Graft  . Amputation  06/18/2012    Procedure: AMPUTATION DIGIT;  Surgeon: Angelia Mould, MD;  Location: [facility] OR;  Service: Vascular;  Laterality: Right;  Right side, amputation of fourth and fifth toes  . Av fistula  placement  06/24/2012    Procedure: ARTERIOVENOUS (AV) FISTULA CREATION;  Surgeon: Angelia Mould, MD;  Location: [place];  Service: Vascular;  Laterality: Left;  . Lower extremity angiogram Right 11/21/2011    Procedure: LOWER EXTREMITY ANGIOGRAM;  Surgeon: Elam Dutch, MD;  Location: [facility] CATH LAB;  Service: Cardiovascular;  Laterality: Right;    Social History Social History  Substance Use Topics  . Smoking status: Current Every Day Smoker -- 0.25 packs/day for 40 years    Types: Cigarettes    Last Attempt to Quit: 01/30/2014  . Smokeless tobacco: Never Used     Comment: 2-3 cigarettes a day  . Alcohol  Use: No     Comment: Previous drinker - However, quit in 2006    Family History Family History  Problem Relation Age of Onset  . Diabetes Mother   . Heart disease Mother   . Hypertension Mother   . Heart attack Mother   . Diabetes Father     Amputation  . Heart disease Father     Before age 97  . Hypertension Father   . Heart attack Father   . Other Father     bleeding problems  . Diabetes Brother   . Heart disease Brother     Before age 69  . Hypertension Brother   . Heart attack Brother     Allergies  Allergies  Allergen Reactions  . Nicoderm [Nicotine] Itching and Rash  . Gabapentin Other (See Comments)    Can't walk, shuts legs down  . Vicodin [Hydrocodone-Acetaminophen] Hives and Itching     Current Facility-Administered Medications  Medication Dose Route Frequency Provider Last Rate Last Dose  . acetaminophen (TYLENOL) tablet 650 mg  650 mg Oral Q6H PRN Leone Brand, MD       Or  . acetaminophen (TYLENOL) suppository 650 mg  650 mg Rectal Q6H PRN Leone Brand, MD      . atorvastatin (LIPITOR) tablet 40 mg  40 mg Oral q1800 Leone Brand, MD      . calcitRIOL (ROCALTROL) capsule 1.75 mcg  1.75 mcg Oral Q T,Th,Sa-HD Valentina Gu, NP      . clindamycin (CLEOCIN) IVPB 600 mg  600 mg Intravenous 3 times per day Leone Brand, MD   600 mg at 01/20/15 0544  . clopidogrel (PLAVIX) tablet 75 mg  75 mg Oral Daily Leone Brand, MD   75 mg at 01/20/15 1017  . heparin injection 5,000 Units  5,000 Units Subcutaneous 3 times per day Leone Brand, MD   5,000 Units at 01/20/15 0544  . HYDROmorphone (DILAUDID) injection 1 mg  1 mg Intravenous Q2H PRN Leone Brand, MD   1 mg at 01/20/15 1035  . insulin aspart (novoLOG) injection 0-9 Units  0-9 Units Subcutaneous TID WC Leone Brand, MD   0 Units at 01/20/15 0800  . insulin glargine (LANTUS) injection 5 Units  5 Units Subcutaneous Daily Leone Brand, MD      . ondansetron [facility]) tablet 4 mg  4 mg Oral Q6H  PRN Leone Brand, MD       Or  . ondansetron [facility]) injection 4 mg  4 mg Intravenous Q6H PRN Leone Brand, MD       Facility-Administered Medications Ordered in Other Encounters  Medication Dose Route Frequency Provider Last Rate Last Dose  . ferumoxytol (FERAHEME) 1,020 mg in sodium chloride 0.9 % 100 mL IVPB  1,020 mg Intravenous  Once Corliss Parish, MD        ROS:   General:  No weight loss, Fever, chills  HEENT: No recent headaches, no nasal bleeding, no visual changes, no sore throat  Neurologic: No dizziness, blackouts, seizures. No recent symptoms of stroke or mini- stroke. No recent episodes of slurred speech, or temporary blindness.  Cardiac: No recent episodes of chest pain/pressure, no shortness of breath at rest.  + shortness of breath with exertion.  Denies history of atrial fibrillation or irregular heartbeat  Vascular: + history of rest pain in feet.  + history of claudication.  No history of non-healing ulcer, No history of DVT   Pulmonary: No home oxygen, no productive cough, no hemoptysis,  No asthma or wheezing  Musculoskeletal:  [ ]  Arthritis, [ ]  Low back pain,  [ ]  Joint pain  Hematologic:No history of hypercoagulable state.  No history of easy bleeding.  No history of anemia  Gastrointestinal: No hematochezia or melena,  No gastroesophageal reflux, no trouble swallowing  Urinary: [ ]  chronic Kidney disease, [ ]  on HD - [ ]  MWF or [ ]  TTHS, [ ]  Burning with urination, [ ]  Frequent urination, [ ]  Difficulty urinating;   Skin: No rashes  Psychological: No history of anxiety,  No history of depression   Physical Examination  Filed Vitals:   01/19/15 2130 01/19/15 2200 01/19/15 2357 01/20/15 0510  BP: 167/77 122/91 152/45 128/59  Pulse: 81 85 87 81  Temp:   99.2 F (37.3 C) 99.9 F (37.7 C)  TempSrc:   Oral Oral  Resp: 16 16 18 20   Height:   5\' 9"  (1.753 m)   Weight:   190 lb 7.6 oz (86.4 kg)   SpO2: 97% 96% 98% 100%    Body mass index  is 28.12 kg/(m^2).  General:  Alert and oriented, no acute distress HEENT: Normal Neck: No JVD Pulmonary: Clear to auscultation bilaterally Cardiac: Regular Rate and Rhythm  Abdomen: Soft, non-tender, non-distended Skin: No rash Extremity Pulses:  2+ right brachial, + thrill left upper arm AVF, absent femoral, dorsalis pedis, posterior tibial pulses bilaterally, right foot warm pink, left foot toes 2-5 dusky with probably some early gangrenous changes, left foot cool extremely painful to touch, erythema from foot into distal leg Musculoskeletal: No deformity or edema  Neurologic: Upper and lower extremity motor 5/5 and symmetric  DATA:   CBC    Component Value Date/Time   WBC 13.7* 01/19/2015 1948   RBC 2.78* 01/19/2015 1948   HGB 8.7* 01/19/2015 1948   HGB 10.8* 06/23/2013 1122   HCT 27.0* 01/19/2015 1948   PLT 167 01/19/2015 1948   MCV 97.1 01/19/2015 1948   MCH 31.3 01/19/2015 1948   MCHC 32.2 01/19/2015 1948   RDW 14.7 01/19/2015 1948   LYMPHSABS 1.7 01/19/2015 1948   MONOABS 1.2* 01/19/2015 1948   EOSABS 0.3 01/19/2015 1948   BASOSABS 0.0 01/19/2015 1948    BMET    Component Value Date/Time   NA 140 01/19/2015 1948   NA 145* 01/30/2014 1033   K 3.7 01/19/2015 1948   CL 98* 01/19/2015 1948   CO2 28 01/19/2015 1948   GLUCOSE 125* 01/19/2015 1948   GLUCOSE 84 01/30/2014 1033   BUN 31* 01/19/2015 1948   BUN 39* 01/30/2014 1033   CREATININE 6.60* 01/19/2015 1948   CREATININE 5.14* 09/21/2013 1014   CALCIUM 9.2 01/19/2015 1948   CALCIUM 7.9* 06/29/2012 1345   GFRNONAA 8* 01/19/2015 1948   GFRAA 9* 01/19/2015  1948    ASSESSMENT:  Severe ischemia left leg with possible left SFA stent occlusion.  Unfortunately, he has had stenting of his left common femoral artery which may limit revascularization options.  The foot is severely ischemic currently with severe rest pain and with palpation of muscle tissue in the foot.  He is at very high risk for limb loss.   PLAN:   I discussed with the patient options of transfer to [facility] since he obviously has had a recent multistage complicated procedure and now has worsening symptoms.  I also gave him the option of proceeding with further work up here which would first consist of CTA of the abdomen and pelvis to determine the current state of his anatomy after his multiple procedures at [place].  The patient wishes to discuss this with his wife.  I emphasized to him that prolonged delay may put him at even higher risk of limb loss.  Please feel free to call me if pt wishes to proceed with work up here.  Otherwise he can be transferred to [place].  Ruta Hinds, MD [facility] [place] Office: [phone] Pager: [phone]

## 2015-01-20 NOTE — Discharge Summary (Signed)
[facility] Discharge Summary  Patient name: John [name] Medical record number: [id number] Date of birth: 1955-12-10 Age: 59 y.o. Gender: male Date of Admission: 01/19/2015  Date of Discharge: 01/20/15 Admitting Physician: Alveda Reasons, MD  Primary Care Provider: [name], MD Consultants: VVS, Nephrology  Indication for Hospitalization: left foot pain  Discharge Diagnoses/Problem List:  Ischemic left foot ESRD on HD TTS  Disposition: Transfer to [facility]   Discharge Condition: Stable   Discharge Exam:  General: NAD, resting comfortably in bed Eyes: PERRL, EOMI. Cardiovascular: RRR, normal s1s2, no mrg. 2+ radial pulses bilaterally, non-palpable DP, PT, popliteal pulses bilaterally. Left arm AVF palpable thrill. Respiratory: CTAB, nl effort Abdomen: soft, NTND, positive bowel sounds MSK: right foot and toes are warm. Left plantar forefoot and toes are cold to touch. Very painful to light touch over all aspects of the foot up to the ankles. Able to move all 5 toes. Bilateral thenar wasting. Skin: mild erythema and swelling over dorsum left forefoot, left anterior shin, mildly warm to touch and tender to palpation medially. Left toes dusky and blackened at tips and over anterior aspect of plantar metatarsal surface.  Neuro: alert and oriented, no focal deficits  Psych: normal mood/affect, normal thought content and speech.  Brief Hospital Course:  [name] is a 59 y.o. male presenting with left foot pain. PMH is significant for severe PVD s/p recent revascularization procedures on 7/25 and 8/8 (through [facility]), HTN, HLD, COPD, T2DM, chronic back pain on opioid, TIA, ESRD on HD TTS, and tobacco abuse. Please see below for details of recent procedure from [facility] Everywhere.   Patient states he recently underwent a procedure this past Monday where a stent was removed from his left ankle and ballooned, and a stent was put in a vessel near his left knee.  He states he did well after the procedure until Tuesday night when he started getting pain. He has stated the pain has been getting progressively worse. Pain is worse on the bottom and outside of his left foot. He has noticed some swelling of the area. He denies any numbness/tingling, just a "throbbing" pain that his home percocet was not able to control so he thus came to the ED. He denies stepping on anything with that foot, says he wears shoes exclusively, denies fevers, chills, nausea, vomiting. He reports quitting smoking 1 week ago.  There were dopplerable pulses of the forefoot. Arterial U/S in ED showed left ABI 0.55, occlusive disease in iliac, femoral, popliteal, tibial arteries.He was given IV dilaudid for pain management and started on clindamycin for possible cellulitis, given warmth and tenderness of dorsum of foot and anterior shin. He has remained afebrile and hemodynamically stable throughout hospital stay. However, clinically more consistent with ischemic foot, given cold toes and blackened discoloration of toes. Vascular surgery was consulted and evaluated patient as being at high risk for limb loss. The patient requested transfer to [place] where he has had his most recent revascularization procedures.   Care everywhere: 01/01/15 procedure: PTA of an 80% left common iliac to less than 20% with a 7 x 40 Bard drug-coated balloon, PTA of an 80% left external iliac to less than 20% with a 7 x 40 Bard drug-coated balloon, PTA and stenting of a left common femoral artery 100% to less than 20% with a 6.5 x 60 mm Supera self-expanding stent, and PTA and stenting of a left SFA 100% to less than 20% with two 6 x 150  Bard DCBs and a 6 x 120 Flex stent.  01/15/15 procedure: 1. Successful PTA of the left distal popliteal 100% to <20% with a 4x60 balloon 2. Successful PTA of the left TP trunk 100% to <20% with a 4x60 balloon 3. Successful PTA of the left AT 100% to <20% with a 3x120 balloon  Issues for  Follow Up:  1. Ischemia of left lower limb  2. Dialysis due today for ESRD   Significant Procedures: None  Significant Labs and Imaging:   Recent Labs Lab 01/19/15 1948  WBC 13.7*  HGB 8.7*  HCT 27.0*  PLT 167    Recent Labs Lab 01/19/15 1948  NA 140  K 3.7  CL 98*  CO2 28  GLUCOSE 125*  BUN 31*  CREATININE 6.60*  CALCIUM 9.2   US Arterial Seg Multiple  01/19/2015   CLINICAL DATA:  59 year old male with a history of prior vascular stenting/ surgery.  Cardiovascular risk factors include diabetes, known vascular disease, smoking.  EXAM: NONINVASIVE PHYSIOLOGIC VASCULAR STUDY OF BILATERAL LOWER EXTREMITIES  TECHNIQUE: Evaluation of both lower extremities was performed at rest, including calculation of ankle-brachial indices, multiple segmental pressure evaluation, segmental Doppler and segmental pulse volume recording.  COMPARISON:  None.  FINDINGS: Right:  Resting ankle brachial index:  Noncompressible vessels.  Segmental blood pressure: Brachial artery measures 160 mm Hg. Noncompressible vessels at the ankle.  Doppler: Segmental Doppler of the right lower extremity demonstrates monophasic waveform of the femoral artery popliteal artery, posterior tibial artery, and dorsalis pedis.  Pulse volume recording: Segmental PVR of the right lower extremity demonstrates loss of augmentation from high thigh to below knee with deterioration of the waveform at the ankle and right great toe.  Left:  Resting ankle brachial index: 0.55  Segmental blood pressure: Upper extremity pressure not measured as the patient has a dialysis graft. Noncompressible PT ankle vessels.  Doppler: Segmental Doppler of the left lower extremity demonstrates biphasic femoral artery, with monophasic popliteal artery, posterior tibial artery, and dorsalis pedis.  Pulse volume recording: Segmental PVR of the left lower extremity demonstrates loss of augmentation from high thigh to below knee, with deterioration of the waveform  at the ankle and left great toe.  Additional:  IMPRESSION: Right:  Ankle brachial index is not measurable, with noncompressible vessels at the ankle, however, remainder of the study demonstrates severe arterial occlusive disease, with abnormal iliac inflow, as well as femoral popliteal and tibial disease.  Left:  Resting ankle-brachial index demonstrates moderate arterial occlusive disease, though this may be spurious as there are noncompressible vessels of the left lower extremity.  Remainder of the study demonstrates multilevel arterial occlusive disease, including abnormal iliac inflow, as well as femoral popliteal and tibial disease.  Signed,  Dulcy Fanny. Earleen Newport, DO  [facility]  [facility]   Electronically Signed   By: Corrie Mckusick D.O.   On: 01/19/2015 17:57   Results/Tests Pending at Time of Discharge: HgbA1c, BMP, blood cultures from 01/19/15  Discharge Medications:    Medication List    ASK your doctor about these medications        albuterol 108 (90 BASE) MCG/ACT inhaler  Commonly known as:  PROVENTIL HFA;VENTOLIN HFA  Inhale 1 puff into the lungs every 6 (six) hours as needed for wheezing or shortness of breath.     aspirin 81 MG chewable tablet  Chew 81 mg by mouth every other day.     cholecalciferol 1000 UNITS tablet  Commonly known as:  VITAMIN D  Take 1,000 Units by mouth daily.     darbepoetin 100 MCG/0.5ML Soln injection  Commonly known as:  ARANESP  Inject 0.5 mLs (100 mcg total) into the skin every Monday.     ethyl chloride spray  Apply 1 application topically daily as needed (prior to dialysis). Use spray on arm prior to dialysis if unable to use gel one hour prior to dialysis.     insulin lispro 100 UNIT/ML injection  Commonly known as:  HUMALOG  Inject 2-3 Units into the skin 2 (two) times daily as needed for high blood sugar (CBG >180). Per sliding scale: inject 2 units subcutaneously for CBG 180-299, inject 3 units  for CBG >300     LANTUS 100 UNIT/ML injection  Generic drug:  insulin glargine  INJECT 6 UNITS INTO THE SKIN DAILY     lidocaine-EPINEPHrine-tetracaine Gel  Commonly known as:  LET  Apply topically to left arm 1 hour before dialysis     multivitamin Tabs tablet  Take 1 tablet by mouth daily.     oxyCODONE-acetaminophen 7.5-325 MG per tablet  Commonly known as:  PERCOCET  Take one by mouth four times a day as needed for pain do not fill before January 21 2015     varenicline 1 MG tablet  Commonly known as:  CHANTIX  Take 1 mg by mouth daily.     vitamin C 1000 MG tablet  Take 1,000 mg by mouth daily.        Discharge Instructions: Please refer to Patient Instructions section of EMR for full details.  Patient was counseled important signs and symptoms that should prompt return to medical care, changes in medications, dietary instructions, activity restrictions, and follow up appointments.   Follow-Up Appointments: Please follow up with your PCP Dr. Nori Riis after hospitalization.    Rogue Bussing, MD 01/20/2015, 1:06 PM PGY-1, [place]

## 2015-01-20 NOTE — Progress Notes (Signed)
Pt transferred to [place]. Report given to [employer] health practitioner. Pt stable. Belongings sent.

## 2015-01-20 NOTE — Consult Note (Signed)
[facility] Renal Consultation Note    Indication for Consultation:  Management of ESRD/hemodialysis; anemia, hypertension/volume and secondary hyperparathyroidism PCP:  HPI: John [name] is a 59 y.o. male with ESRD who has hemodialysis at [place], T,S. Past medical history significant for COPD, PAD, CVA, Hypertension, DM, Hyperlipidemia. Patient is a smoker.  Presented to [facility] ED with C/O L Foot pain. Patient has severe PAD and has had resent peripheral vascular interventions with recent procedure done Monday for PTA of the left distal popliteal, PTA of the left TP trunk and PTA of the left AT.( per Care Everywhere) Patient is on plavix.  I saw patient in HD unit Tuesday and he mentioned that his L. Foot had started to hurt and that he wasn't getting relief from pain medications. Patient says the pain continued to worsen and he presented to ED last pm. Arterial U/S in ED showed left ABI 0.55, occlusive disease in iliac, femoral, popliteal, tibial arteries. Was admitted for treatment of possible cellulitis and evaluation of PAD. Patient denies fever, chills, NVD, chest pain, SOB, Syncope. Was taking percocet for foot pain without relief of symptoms. Touching foot makes pain worse, nothing has entirely relieved pain. Patient extremely anxious and concerned that he may lose his leg.  Patient has regular HD T,T, S at [facility]. Patient compliant with treatment plan.   Past Medical History  Diagnosis Date  . COPD (chronic obstructive pulmonary disease)   . Chronic back pain     Chronic narcotics for this for many years  . Diabetes mellitus   . Hyperlipidemia   . Hypertension   . Foot pain   . Peripheral vascular disease   . Asthma   . Stroke     No residual "Mini strokes"  . Ulcer 1985  . Fracture of foot     Compond- Right  . Neuromuscular disorder     periferal neuropathy  . Chronic kidney disease     CKD stage III   Past Surgical  History  Procedure Laterality Date  . Neck surgery    . Eye surgery    . Cardiac catheterization    . Endarterectomy  12/18/2011    Procedure: ENDARTERECTOMY CAROTID;  Surgeon: Angelia Mould, MD;  Location: [place];  Service: Vascular;  Laterality: Left;  . Carotid endarterectomy    . Axillary-femoral bypass graft  06/18/2012    Procedure: BYPASS GRAFT AXILLA-BIFEMORAL;  Surgeon: Angelia Mould, MD;  Location: [place];  Service: Vascular;  Laterality: Right;  Right Axillo to Right Femoral Bypass Graft  . Femoral-popliteal bypass graft  06/18/2012    Procedure: BYPASS GRAFT FEMORAL-POPLITEAL ARTERY;  Surgeon: Angelia Mould, MD;  Location: [facility] OR;  Service: Vascular;  Laterality: Right;  Right Femoral to Above Knee Popliteal Bypass Graft  . Amputation  06/18/2012    Procedure: AMPUTATION DIGIT;  Surgeon: Angelia Mould, MD;  Location: [facility] OR;  Service: Vascular;  Laterality: Right;  Right side, amputation of fourth and fifth toes  . Av fistula placement  06/24/2012    Procedure: ARTERIOVENOUS (AV) FISTULA CREATION;  Surgeon: Angelia Mould, MD;  Location: [place];  Service: Vascular;  Laterality: Left;  . Lower extremity angiogram Right 11/21/2011    Procedure: LOWER EXTREMITY ANGIOGRAM;  Surgeon: Elam Dutch, MD;  Location: [facility] CATH LAB;  Service: Cardiovascular;  Laterality: Right;   Family History  Problem Relation Age of Onset  . Diabetes Mother   . Heart disease Mother   .  Hypertension Mother   . Heart attack Mother   . Diabetes Father     Amputation  . Heart disease Father     Before age 56  . Hypertension Father   . Heart attack Father   . Other Father     bleeding problems  . Diabetes Brother   . Heart disease Brother     Before age 60  . Hypertension Brother   . Heart attack Brother    Social History:  reports that he has been smoking Cigarettes.  He has a 10 pack-year smoking history. He has never used smokeless tobacco. He reports that he  does not drink alcohol or use illicit drugs. Allergies  Allergen Reactions  . Nicoderm [Nicotine] Itching and Rash  . Gabapentin Other (See Comments)    Can't walk, shuts legs down  . Vicodin [Hydrocodone-Acetaminophen] Hives and Itching   Prior to Admission medications   Medication Sig Start Date End Date Taking? Authorizing Provider  albuterol (PROVENTIL HFA;VENTOLIN HFA) 108 (90 BASE) MCG/ACT inhaler Inhale 1 puff into the lungs every 6 (six) hours as needed for wheezing or shortness of breath.   Yes Historical Provider, MD  Ascorbic Acid (VITAMIN C) 1000 MG tablet Take 1,000 mg by mouth daily.   Yes Historical Provider, MD  aspirin 81 MG chewable tablet Chew 81 mg by mouth every other day.    Yes Historical Provider, MD  cholecalciferol (VITAMIN D) 1000 UNITS tablet Take 1,000 Units by mouth daily.   Yes Historical Provider, MD  ethyl chloride spray Apply 1 application topically daily as needed (prior to dialysis). Use spray on arm prior to dialysis if unable to use gel one hour prior to dialysis. 04/22/14  Yes Historical Provider, MD  insulin lispro (HUMALOG) 100 UNIT/ML injection Inject 2-3 Units into the skin 2 (two) times daily as needed for high blood sugar (CBG >180). Per sliding scale: inject 2 units subcutaneously for CBG 180-299, inject 3 units for CBG >300 08/13/10  Yes Zenia Resides, MD  LANTUS 100 UNIT/ML injection INJECT 6 UNITS INTO THE SKIN DAILY Patient taking differently: INJECT 30 UNITS INTO THE SKIN BEFORE BREAKFAST IF NEEDED FOR CBG >140 10/30/14  Yes [name], MD  lidocaine-EPINEPHrine-tetracaine (LET) GEL Apply topically to left arm 1 hour before dialysis Patient taking differently: Apply topically daily as needed (prior to dialysis). Apply topically to left arm 1 hour before dialysis 03/20/14  Yes Vivi Barrack, MD  multivitamin (RENA-VIT) TABS tablet Take 1 tablet by mouth daily.  04/12/14  Yes Historical Provider, MD  oxyCODONE-acetaminophen (PERCOCET) 7.5-325 MG  per tablet Take one by mouth four times a day as needed for pain do not fill before January 21 2015 Patient taking differently: Take 1 tablet by mouth every 6 (six) hours. Scheduled, do not fill before January 21 2015 01/05/15  Yes [name], MD  varenicline (CHANTIX) 1 MG tablet Take 1 mg by mouth daily.   Yes Historical Provider, MD  darbepoetin (ARANESP) 100 MCG/0.5ML SOLN Inject 0.5 mLs (100 mcg total) into the skin every Monday. 06/24/12   Timmothy Euler, MD   Current Facility-Administered Medications  Medication Dose Route Frequency Provider Last Rate Last Dose  . acetaminophen (TYLENOL) tablet 650 mg  650 mg Oral Q6H PRN Leone Brand, MD       Or  . acetaminophen (TYLENOL) suppository 650 mg  650 mg Rectal Q6H PRN Leone Brand, MD      . atorvastatin (  LIPITOR) tablet 40 mg  40 mg Oral q1800 Leone Brand, MD      . calcitRIOL (ROCALTROL) capsule 1.75 mcg  1.75 mcg Oral Q T,Th,Sa-HD Valentina Gu, NP      . clindamycin (CLEOCIN) IVPB 600 mg  600 mg Intravenous 3 times per day Leone Brand, MD   600 mg at 01/20/15 0544  . clopidogrel (PLAVIX) tablet 75 mg  75 mg Oral Daily Leone Brand, MD   75 mg at 01/20/15 1017  . heparin injection 5,000 Units  5,000 Units Subcutaneous 3 times per day Leone Brand, MD   5,000 Units at 01/20/15 0544  . HYDROmorphone (DILAUDID) injection 1 mg  1 mg Intravenous Q2H PRN Leone Brand, MD   1 mg at 01/20/15 1035  . insulin aspart (novoLOG) injection 0-9 Units  0-9 Units Subcutaneous TID WC Leone Brand, MD   0 Units at 01/20/15 0800  . insulin glargine (LANTUS) injection 5 Units  5 Units Subcutaneous Daily Leone Brand, MD      . lanthanum [facility]) chewable tablet 2,000 mg  2,000 mg Oral TID WC Alveda Reasons, MD       And  . lanthanum [facility]) chewable tablet 1,000 mg  1,000 mg Oral BID BM Alveda Reasons, MD      . ondansetron [facility]) tablet 4 mg  4 mg Oral Q6H PRN Leone Brand, MD       Or  . ondansetron [facility])  injection 4 mg  4 mg Intravenous Q6H PRN Leone Brand, MD       Facility-Administered Medications Ordered in Other Encounters  Medication Dose Route Frequency Provider Last Rate Last Dose  . ferumoxytol (FERAHEME) 1,020 mg in sodium chloride 0.9 % 100 mL IVPB  1,020 mg Intravenous Once Corliss Parish, MD       Labs: Basic Metabolic Panel:  Recent Labs Lab 01/19/15 1948  NA 140  K 3.7  CL 98*  CO2 28  GLUCOSE 125*  BUN 31*  CREATININE 6.60*  CALCIUM 9.2   Liver Function Tests: No results for input(s): AST, ALT, ALKPHOS, BILITOT, PROT, ALBUMIN in the last 168 hours. No results for input(s): LIPASE, AMYLASE in the last 168 hours. No results for input(s): AMMONIA in the last 168 hours. CBC:  Recent Labs Lab 01/19/15 1948  WBC 13.7*  NEUTROABS 10.6*  HGB 8.7*  HCT 27.0*  MCV 97.1  PLT 167   Cardiac Enzymes: No results for input(s): CKTOTAL, CKMB, CKMBINDEX, TROPONINI in the last 168 hours. CBG:  Recent Labs Lab 01/19/15 2354 01/20/15 0808  GLUCAP 83 93   Iron Studies: No results for input(s): IRON, TIBC, TRANSFERRIN, FERRITIN in the last 72 hours. Studies/Results: US Arterial Seg Multiple  01/19/2015   CLINICAL DATA:  59 year old male with a history of prior vascular stenting/ surgery.  Cardiovascular risk factors include diabetes, known vascular disease, smoking.  EXAM: NONINVASIVE PHYSIOLOGIC VASCULAR STUDY OF BILATERAL LOWER EXTREMITIES  TECHNIQUE: Evaluation of both lower extremities was performed at rest, including calculation of ankle-brachial indices, multiple segmental pressure evaluation, segmental Doppler and segmental pulse volume recording.  COMPARISON:  None.  FINDINGS: Right:  Resting ankle brachial index:  Noncompressible vessels.  Segmental blood pressure: Brachial artery measures 160 mm Hg. Noncompressible vessels at the ankle.  Doppler: Segmental Doppler of the right lower extremity demonstrates monophasic waveform of the femoral artery popliteal  artery, posterior tibial artery, and dorsalis pedis.  Pulse volume recording: Segmental PVR of the  right lower extremity demonstrates loss of augmentation from high thigh to below knee with deterioration of the waveform at the ankle and right great toe.  Left:  Resting ankle brachial index: 0.55  Segmental blood pressure: Upper extremity pressure not measured as the patient has a dialysis graft. Noncompressible PT ankle vessels.  Doppler: Segmental Doppler of the left lower extremity demonstrates biphasic femoral artery, with monophasic popliteal artery, posterior tibial artery, and dorsalis pedis.  Pulse volume recording: Segmental PVR of the left lower extremity demonstrates loss of augmentation from high thigh to below knee, with deterioration of the waveform at the ankle and left great toe.  Additional:  IMPRESSION: Right:  Ankle brachial index is not measurable, with noncompressible vessels at the ankle, however, remainder of the study demonstrates severe arterial occlusive disease, with abnormal iliac inflow, as well as femoral popliteal and tibial disease.  Left:  Resting ankle-brachial index demonstrates moderate arterial occlusive disease, though this may be spurious as there are noncompressible vessels of the left lower extremity.  Remainder of the study demonstrates multilevel arterial occlusive disease, including abnormal iliac inflow, as well as femoral popliteal and tibial disease.  Signed,  Dulcy Fanny. Earleen Newport, DO  [facility]  [facility] Radiology   Electronically Signed   By: Corrie Mckusick D.O.   On: 01/19/2015 17:57    ROS: As per HPI otherwise negative.   Physical Exam: Filed Vitals:   01/19/15 2130 01/19/15 2200 01/19/15 2357 01/20/15 0510  BP: 167/77 122/91 152/45 128/59  Pulse: 81 85 87 81  Temp:   99.2 F (37.3 C) 99.9 F (37.7 C)  TempSrc:   Oral Oral  Resp: 16 16 18 20   Height:   5\' 9"  (1.753 m)   Weight:   86.4 kg (190 lb 7.6 oz)   SpO2:  97% 96% 98% 100%     General: Well developed, well nourished, in moderate pain when L foot is touched.  Head: Normocephalic, atraumatic, sclera non-icteric, mucus membranes are moist Neck: Supple. JVD not elevated. Lungs: Clear bilaterally to auscultation without wheezes, rales, or rhonchi. Breathing is unlabored. Heart: RRR with S1 S2. No murmurs, rubs, or gallops appreciated. Abdomen: Soft, non-tender, non-distended with normoactive bowel sounds. No rebound/guarding. No obvious abdominal masses.. Lower extremities:  L foot reddened across middle portion of foot, warm and exquisitely tender to touch. Doppler pedal and PT. Toes on L foot blackened. R. Foot has 4th and 5th toes amputated, doppler pedal and PT pulses with redness.  Neuro: Alert and oriented X 3. Moves all extremities spontaneously. Psych:  Responds to questions appropriately with a normal affect. Dialysis Access: LUA AVF + bruit. Recent AF 08/04 586 decreased from 759.   Dialysis Orders: Center: [place] . EDW 86 HD [place] 2.0 K 2.0 Ca 1 mg   Time 4 hours Heparin 7500. Access LUA AVF BFR 400 DFR 800    Mircera 50 mcg IV q 2 weeks (HGB 9.0 Tsat 29 01/18/2015 last dose Mircera 01/17/2015) Venofer 50 mg IV weekly (last dose 01/18/2015)  Assessment/Plan: 1.  Cellulitis L Foot: being treated with clindamycin IV. Dilaudid IV for pain.  2.  ESRD -  T,T,S at [facility] Heparin 7500 units per treatment 3.  Hypertension/volume  - No home BP meds. EDW 86 kg. Compliant with HD. Will have HD today UF to OP EDW.   4.  Anemia  - HGB 8.7. Follow CBCs.  5.  Metabolic bone disease -  Ca 9.2. Last Phos 5.0 (  01/18/15) Continue Fosrenol.  6.  Nutrition - Changed to renal diet/renal vit added. Albumin 3.6 (12/18/2014) 7. DM: Per primary 8. PAD:  VVS consulted. Severe Ischemia L leg with possible left SFA stent occlusion. Option left with pt to be treated here or transfer to [place] to Dr. Andree Elk where he has had previous multistage interventions done.    Rita H. Owens Shark, NP-C 01/20/2015, 11:08 AM  [employer] [id number]  Pt seen, examined and agree w A/P as above.  Kelly Splinter MD pager [phone]    cell [phone] 01/20/2015, 1:42 PM

## 2015-01-22 DIAGNOSIS — N186 End stage renal disease: Secondary | ICD-10-CM | POA: Insufficient documentation

## 2015-01-22 LAB — HEMOGLOBIN A1C
HEMOGLOBIN A1C: 5.7 % — AB (ref 4.8–5.6)
MEAN PLASMA GLUCOSE: 117 mg/dL

## 2015-01-25 DIAGNOSIS — D509 Iron deficiency anemia, unspecified: Secondary | ICD-10-CM | POA: Diagnosis not present

## 2015-01-25 DIAGNOSIS — R52 Pain, unspecified: Secondary | ICD-10-CM | POA: Diagnosis not present

## 2015-01-25 DIAGNOSIS — R51 Headache: Secondary | ICD-10-CM | POA: Diagnosis not present

## 2015-01-25 DIAGNOSIS — E119 Type 2 diabetes mellitus without complications: Secondary | ICD-10-CM | POA: Diagnosis not present

## 2015-01-25 DIAGNOSIS — N186 End stage renal disease: Secondary | ICD-10-CM | POA: Diagnosis not present

## 2015-01-25 DIAGNOSIS — D688 Other specified coagulation defects: Secondary | ICD-10-CM | POA: Diagnosis not present

## 2015-01-25 DIAGNOSIS — D631 Anemia in chronic kidney disease: Secondary | ICD-10-CM | POA: Diagnosis not present

## 2015-01-25 DIAGNOSIS — N2581 Secondary hyperparathyroidism of renal origin: Secondary | ICD-10-CM | POA: Diagnosis not present

## 2015-01-25 DIAGNOSIS — D689 Coagulation defect, unspecified: Secondary | ICD-10-CM | POA: Diagnosis not present

## 2015-01-25 LAB — CULTURE, BLOOD (ROUTINE X 2)
CULTURE: NO GROWTH
Culture: NO GROWTH

## 2015-01-27 DIAGNOSIS — N186 End stage renal disease: Secondary | ICD-10-CM | POA: Diagnosis not present

## 2015-01-27 DIAGNOSIS — R51 Headache: Secondary | ICD-10-CM | POA: Diagnosis not present

## 2015-01-27 DIAGNOSIS — D631 Anemia in chronic kidney disease: Secondary | ICD-10-CM | POA: Diagnosis not present

## 2015-01-27 DIAGNOSIS — E119 Type 2 diabetes mellitus without complications: Secondary | ICD-10-CM | POA: Diagnosis not present

## 2015-01-27 DIAGNOSIS — D509 Iron deficiency anemia, unspecified: Secondary | ICD-10-CM | POA: Diagnosis not present

## 2015-01-27 DIAGNOSIS — D689 Coagulation defect, unspecified: Secondary | ICD-10-CM | POA: Diagnosis not present

## 2015-01-27 DIAGNOSIS — D688 Other specified coagulation defects: Secondary | ICD-10-CM | POA: Diagnosis not present

## 2015-01-27 DIAGNOSIS — N2581 Secondary hyperparathyroidism of renal origin: Secondary | ICD-10-CM | POA: Diagnosis not present

## 2015-01-27 DIAGNOSIS — R52 Pain, unspecified: Secondary | ICD-10-CM | POA: Diagnosis not present

## 2015-01-29 ENCOUNTER — Encounter: Payer: Self-pay | Admitting: Family Medicine

## 2015-01-29 DIAGNOSIS — I739 Peripheral vascular disease, unspecified: Secondary | ICD-10-CM | POA: Diagnosis not present

## 2015-01-29 DIAGNOSIS — N186 End stage renal disease: Secondary | ICD-10-CM | POA: Diagnosis not present

## 2015-01-29 DIAGNOSIS — E1129 Type 2 diabetes mellitus with other diabetic kidney complication: Secondary | ICD-10-CM | POA: Diagnosis not present

## 2015-01-29 DIAGNOSIS — M549 Dorsalgia, unspecified: Secondary | ICD-10-CM | POA: Diagnosis not present

## 2015-01-29 DIAGNOSIS — E1140 Type 2 diabetes mellitus with diabetic neuropathy, unspecified: Secondary | ICD-10-CM | POA: Diagnosis not present

## 2015-01-29 NOTE — Progress Notes (Signed)
Message from Harty is out of hosp in [place]. Needs rx for walker with a seat. I have left up front for him [name]

## 2015-01-30 DIAGNOSIS — E119 Type 2 diabetes mellitus without complications: Secondary | ICD-10-CM | POA: Diagnosis not present

## 2015-01-30 DIAGNOSIS — D631 Anemia in chronic kidney disease: Secondary | ICD-10-CM | POA: Diagnosis not present

## 2015-01-30 DIAGNOSIS — D689 Coagulation defect, unspecified: Secondary | ICD-10-CM | POA: Diagnosis not present

## 2015-01-30 DIAGNOSIS — D688 Other specified coagulation defects: Secondary | ICD-10-CM | POA: Diagnosis not present

## 2015-01-30 DIAGNOSIS — N186 End stage renal disease: Secondary | ICD-10-CM | POA: Diagnosis not present

## 2015-01-30 DIAGNOSIS — R51 Headache: Secondary | ICD-10-CM | POA: Diagnosis not present

## 2015-01-30 DIAGNOSIS — D509 Iron deficiency anemia, unspecified: Secondary | ICD-10-CM | POA: Diagnosis not present

## 2015-01-30 DIAGNOSIS — R52 Pain, unspecified: Secondary | ICD-10-CM | POA: Diagnosis not present

## 2015-01-30 DIAGNOSIS — N2581 Secondary hyperparathyroidism of renal origin: Secondary | ICD-10-CM | POA: Diagnosis not present

## 2015-02-01 DIAGNOSIS — D509 Iron deficiency anemia, unspecified: Secondary | ICD-10-CM | POA: Diagnosis not present

## 2015-02-01 DIAGNOSIS — E119 Type 2 diabetes mellitus without complications: Secondary | ICD-10-CM | POA: Diagnosis not present

## 2015-02-01 DIAGNOSIS — D689 Coagulation defect, unspecified: Secondary | ICD-10-CM | POA: Diagnosis not present

## 2015-02-01 DIAGNOSIS — D631 Anemia in chronic kidney disease: Secondary | ICD-10-CM | POA: Diagnosis not present

## 2015-02-01 DIAGNOSIS — R51 Headache: Secondary | ICD-10-CM | POA: Diagnosis not present

## 2015-02-01 DIAGNOSIS — N2581 Secondary hyperparathyroidism of renal origin: Secondary | ICD-10-CM | POA: Diagnosis not present

## 2015-02-01 DIAGNOSIS — D688 Other specified coagulation defects: Secondary | ICD-10-CM | POA: Diagnosis not present

## 2015-02-01 DIAGNOSIS — R52 Pain, unspecified: Secondary | ICD-10-CM | POA: Diagnosis not present

## 2015-02-01 DIAGNOSIS — N186 End stage renal disease: Secondary | ICD-10-CM | POA: Diagnosis not present

## 2015-02-02 ENCOUNTER — Encounter (HOSPITAL_COMMUNITY): Payer: Self-pay

## 2015-02-02 ENCOUNTER — Emergency Department (HOSPITAL_COMMUNITY)
Admission: EM | Admit: 2015-02-02 | Discharge: 2015-02-02 | Disposition: A | Discharge status: Other Acute Inpatient Hospital | Payer: Medicare Other | Attending: Emergency Medicine | Admitting: Emergency Medicine

## 2015-02-02 DIAGNOSIS — G8929 Other chronic pain: Secondary | ICD-10-CM | POA: Diagnosis not present

## 2015-02-02 DIAGNOSIS — E119 Type 2 diabetes mellitus without complications: Secondary | ICD-10-CM | POA: Insufficient documentation

## 2015-02-02 DIAGNOSIS — L089 Local infection of the skin and subcutaneous tissue, unspecified: Secondary | ICD-10-CM | POA: Diagnosis present

## 2015-02-02 DIAGNOSIS — J45909 Unspecified asthma, uncomplicated: Secondary | ICD-10-CM | POA: Diagnosis not present

## 2015-02-02 DIAGNOSIS — Z7982 Long term (current) use of aspirin: Secondary | ICD-10-CM | POA: Diagnosis not present

## 2015-02-02 DIAGNOSIS — Z79899 Other long term (current) drug therapy: Secondary | ICD-10-CM | POA: Insufficient documentation

## 2015-02-02 DIAGNOSIS — Z720 Tobacco use: Secondary | ICD-10-CM | POA: Insufficient documentation

## 2015-02-02 DIAGNOSIS — Z8249 Family history of ischemic heart disease and other diseases of the circulatory system: Secondary | ICD-10-CM | POA: Diagnosis not present

## 2015-02-02 DIAGNOSIS — Z8673 Personal history of transient ischemic attack (TIA), and cerebral infarction without residual deficits: Secondary | ICD-10-CM | POA: Diagnosis not present

## 2015-02-02 DIAGNOSIS — I70262 Atherosclerosis of native arteries of extremities with gangrene, left leg: Secondary | ICD-10-CM | POA: Diagnosis not present

## 2015-02-02 DIAGNOSIS — I129 Hypertensive chronic kidney disease with stage 1 through stage 4 chronic kidney disease, or unspecified chronic kidney disease: Secondary | ICD-10-CM | POA: Diagnosis not present

## 2015-02-02 DIAGNOSIS — I70212 Atherosclerosis of native arteries of extremities with intermittent claudication, left leg: Secondary | ICD-10-CM | POA: Diagnosis not present

## 2015-02-02 DIAGNOSIS — Z992 Dependence on renal dialysis: Secondary | ICD-10-CM | POA: Diagnosis not present

## 2015-02-02 DIAGNOSIS — Z89421 Acquired absence of other right toe(s): Secondary | ICD-10-CM | POA: Diagnosis not present

## 2015-02-02 DIAGNOSIS — E785 Hyperlipidemia, unspecified: Secondary | ICD-10-CM | POA: Insufficient documentation

## 2015-02-02 DIAGNOSIS — I771 Stricture of artery: Secondary | ICD-10-CM | POA: Diagnosis not present

## 2015-02-02 DIAGNOSIS — D649 Anemia, unspecified: Secondary | ICD-10-CM | POA: Diagnosis not present

## 2015-02-02 DIAGNOSIS — N183 Chronic kidney disease, stage 3 (moderate): Secondary | ICD-10-CM | POA: Insufficient documentation

## 2015-02-02 DIAGNOSIS — E1129 Type 2 diabetes mellitus with other diabetic kidney complication: Secondary | ICD-10-CM | POA: Diagnosis not present

## 2015-02-02 DIAGNOSIS — J449 Chronic obstructive pulmonary disease, unspecified: Secondary | ICD-10-CM | POA: Insufficient documentation

## 2015-02-02 DIAGNOSIS — Z794 Long term (current) use of insulin: Secondary | ICD-10-CM | POA: Diagnosis not present

## 2015-02-02 DIAGNOSIS — I96 Gangrene, not elsewhere classified: Secondary | ICD-10-CM | POA: Diagnosis not present

## 2015-02-02 DIAGNOSIS — N186 End stage renal disease: Secondary | ICD-10-CM | POA: Diagnosis not present

## 2015-02-02 DIAGNOSIS — Z09 Encounter for follow-up examination after completed treatment for conditions other than malignant neoplasm: Secondary | ICD-10-CM | POA: Diagnosis not present

## 2015-02-02 DIAGNOSIS — F17210 Nicotine dependence, cigarettes, uncomplicated: Secondary | ICD-10-CM | POA: Diagnosis not present

## 2015-02-02 DIAGNOSIS — I998 Other disorder of circulatory system: Secondary | ICD-10-CM | POA: Insufficient documentation

## 2015-02-02 DIAGNOSIS — I739 Peripheral vascular disease, unspecified: Secondary | ICD-10-CM | POA: Diagnosis not present

## 2015-02-02 DIAGNOSIS — E118 Type 2 diabetes mellitus with unspecified complications: Secondary | ICD-10-CM | POA: Diagnosis not present

## 2015-02-02 DIAGNOSIS — N189 Chronic kidney disease, unspecified: Secondary | ICD-10-CM | POA: Diagnosis not present

## 2015-02-02 DIAGNOSIS — I10 Essential (primary) hypertension: Secondary | ICD-10-CM | POA: Diagnosis not present

## 2015-02-02 DIAGNOSIS — D631 Anemia in chronic kidney disease: Secondary | ICD-10-CM | POA: Diagnosis not present

## 2015-02-02 DIAGNOSIS — E1122 Type 2 diabetes mellitus with diabetic chronic kidney disease: Secondary | ICD-10-CM | POA: Diagnosis not present

## 2015-02-02 DIAGNOSIS — I120 Hypertensive chronic kidney disease with stage 5 chronic kidney disease or end stage renal disease: Secondary | ICD-10-CM | POA: Diagnosis not present

## 2015-02-02 LAB — CBC WITH DIFFERENTIAL/PLATELET
BASOS ABS: 0 10*3/uL (ref 0.0–0.1)
Basophils Relative: 0 % (ref 0–1)
EOS PCT: 2 % (ref 0–5)
Eosinophils Absolute: 0.2 10*3/uL (ref 0.0–0.7)
HCT: 28.5 % — ABNORMAL LOW (ref 39.0–52.0)
Hemoglobin: 8.8 g/dL — ABNORMAL LOW (ref 13.0–17.0)
LYMPHS PCT: 13 % (ref 12–46)
Lymphs Abs: 1.2 10*3/uL (ref 0.7–4.0)
MCH: 30.7 pg (ref 26.0–34.0)
MCHC: 30.9 g/dL (ref 30.0–36.0)
MCV: 99.3 fL (ref 78.0–100.0)
MONO ABS: 0.9 10*3/uL (ref 0.1–1.0)
MONOS PCT: 9 % (ref 3–12)
Neutro Abs: 7.1 10*3/uL (ref 1.7–7.7)
Neutrophils Relative %: 76 % (ref 43–77)
PLATELETS: 174 10*3/uL (ref 150–400)
RBC: 2.87 MIL/uL — ABNORMAL LOW (ref 4.22–5.81)
RDW: 15.5 % (ref 11.5–15.5)
WBC: 9.4 10*3/uL (ref 4.0–10.5)

## 2015-02-02 LAB — BASIC METABOLIC PANEL
ANION GAP: 9 (ref 5–15)
BUN: 18 mg/dL (ref 6–20)
CHLORIDE: 95 mmol/L — AB (ref 101–111)
CO2: 33 mmol/L — AB (ref 22–32)
Calcium: 8.9 mg/dL (ref 8.9–10.3)
Creatinine, Ser: 5.57 mg/dL — ABNORMAL HIGH (ref 0.61–1.24)
GFR calc Af Amer: 12 mL/min — ABNORMAL LOW (ref >60)
GFR calc non Af Amer: 10 mL/min — ABNORMAL LOW (ref >60)
GLUCOSE: 169 mg/dL — AB (ref 65–99)
POTASSIUM: 3.4 mmol/L — AB (ref 3.5–5.1)
Sodium: 137 mmol/L (ref 135–145)

## 2015-02-02 LAB — APTT: APTT: 31 s (ref 24–37)

## 2015-02-02 LAB — PROTIME-INR
INR: 1.13 (ref 0.00–1.49)
Prothrombin Time: 14.7 seconds (ref 11.6–15.2)

## 2015-02-02 MED ORDER — HEPARIN (PORCINE) IN NACL 100-0.45 UNIT/ML-% IJ SOLN
1000.0000 [IU]/h | INTRAMUSCULAR | Status: DC
  Administered 2015-02-02: 1000 [IU]/h via INTRAVENOUS
  Filled 2015-02-02: qty 250

## 2015-02-02 NOTE — Progress Notes (Signed)
ANTICOAGULATION CONSULT NOTE - Initial Consult  Pharmacy Consult for heparin Indication: PVD  Allergies  Allergen Reactions  . Nicoderm [Nicotine] Itching and Rash  . Gabapentin Other (See Comments)    Can't walk, shuts legs down  . Vicodin [Hydrocodone-Acetaminophen] Hives and Itching    Patient Measurements:   Heparin Dosing Weight: 86 kg  Vital Signs: Temp: 99.3 F (37.4 C) (08/26 1147) Temp Source: Oral (08/26 1147) BP: 148/59 mmHg (08/26 1147) Pulse Rate: 88 (08/26 1147)  Labs: No results for input(s): HGB, HCT, PLT, APTT, LABPROT, INR, HEPARINUNFRC, CREATININE, CKTOTAL, CKMB, TROPONINI in the last 72 hours.  Estimated Creatinine Clearance: 11.1 mL/min (by C-G formula based on Cr of 7.72).   Medical History: Past Medical History  Diagnosis Date  . COPD (chronic obstructive pulmonary disease)   . Chronic back pain     Chronic narcotics for this for many years  . Diabetes mellitus   . Hyperlipidemia   . Hypertension   . Foot pain   . Peripheral vascular disease   . Asthma   . Stroke     No residual "Mini strokes"  . Ulcer 1985  . Fracture of foot     Compond- Right  . Neuromuscular disorder     periferal neuropathy  . Chronic kidney disease     CKD stage III    Medications:  See electronic list  Assessment: 59 y/o male with ESRD on HD TTS who presents to the ED with L foot discoloration for 2-3 days. He had stents placed in L leg in July and August. Spoke with Dr. Maryan Rued who discussed case with vascular in [place]. Begin heparin drip with no bolus at 10-12 units/kg/hr with lower end heparin level goal range. No bleeding noted, last CBC available currently was from 8/12 and Hb 8.7, platelets 167.   Goal of Therapy:  Heparin level 0.3-0.5 units/ml Monitor platelets by anticoagulation protocol: Yes   Plan:  - Heparin drip at 1000 units/hr - 8 hr heparin level - Daily heparin level and CBC - Monitor for s/sx of bleeding  [facility], [place].D.,  BCPS Clinical Pharmacist Pager: [phone] 02/02/2015 12:21 PM

## 2015-02-02 NOTE — ED Provider Notes (Addendum)
CSN: [id number]     Arrival date & time 02/02/15  1136 History   First MD Initiated Contact with Patient 02/02/15 1149     No chief complaint on file.    (Consider location/radiation/quality/duration/timing/severity/associated sxs/prior Treatment) HPI Comments: Patient is a 59 year old male with a history of COPD, diabetes, hypertension, peripheral vascular disease and end-stage renal disease on dialysis Tuesday Thursday Saturday who presents with discoloration of his toes. Patient recently underwent admission to [facility] where Dr. Andree Elk with vascular surgery placed a stent in his knee. He was then they're just a week ago for failure of the stent and requiring a second stent to provide blood flow to the foot. Yesterday after dialysis he noticed some darkening of his toes on the left foot and this morning they were very dark and he Concerned and came in. He denies any excessive pain, fever or swelling  The history is provided by the patient.    Past Medical History  Diagnosis Date  . COPD (chronic obstructive pulmonary disease)   . Chronic back pain     Chronic narcotics for this for many years  . Diabetes mellitus   . Hyperlipidemia   . Hypertension   . Foot pain   . Peripheral vascular disease   . Asthma   . Stroke     No residual "Mini strokes"  . Ulcer 1985  . Fracture of foot     [place]- Right  . Neuromuscular disorder     periferal neuropathy  . Chronic kidney disease     CKD stage III   Past Surgical History  Procedure Laterality Date  . Neck surgery    . Eye surgery    . Cardiac catheterization    . Endarterectomy  12/18/2011    Procedure: ENDARTERECTOMY CAROTID;  Surgeon: Angelia Mould, MD;  Location: [place];  Service: Vascular;  Laterality: Left;  . Carotid endarterectomy    . Axillary-femoral bypass graft  06/18/2012    Procedure: BYPASS GRAFT AXILLA-BIFEMORAL;  Surgeon: Angelia Mould, MD;  Location: [facility] [place];  Service: Vascular;  Laterality: Right;   Right Axillo to Right Femoral Bypass Graft  . Femoral-popliteal bypass graft  06/18/2012    Procedure: BYPASS GRAFT FEMORAL-POPLITEAL ARTERY;  Surgeon: Angelia Mould, MD;  Location: [facility] OR;  Service: Vascular;  Laterality: Right;  Right Femoral to Above Knee Popliteal Bypass Graft  . Amputation  06/18/2012    Procedure: AMPUTATION DIGIT;  Surgeon: Angelia Mould, MD;  Location: [facility] OR;  Service: Vascular;  Laterality: Right;  Right side, amputation of fourth and fifth toes  . Av fistula placement  06/24/2012    Procedure: ARTERIOVENOUS (AV) FISTULA CREATION;  Surgeon: Angelia Mould, MD;  Location: [facility];  Service: Vascular;  Laterality: Left;  . Lower extremity angiogram Right 11/21/2011    Procedure: LOWER EXTREMITY ANGIOGRAM;  Surgeon: Elam Dutch, MD;  Location: [facility] CATH LAB;  Service: Cardiovascular;  Laterality: Right;   Family History  Problem Relation Age of Onset  . Diabetes Mother   . Heart disease Mother   . Hypertension Mother   . Heart attack Mother   . Diabetes Father     Amputation  . Heart disease Father     Before age 44  . Hypertension Father   . Heart attack Father   . Other Father     bleeding problems  . Diabetes Brother   . Heart disease Brother     Before age 49  . Hypertension  Brother   . Heart attack Brother    Social History  Substance Use Topics  . Smoking status: Current Every Day Smoker -- 0.25 packs/day for 40 years    Types: Cigarettes    Last Attempt to Quit: 01/30/2014  . Smokeless tobacco: Never Used     Comment: 2-3 cigarettes a day  . Alcohol Use: No     Comment: Previous drinker - However, quit in 2006    Review of Systems  All other systems reviewed and are negative.     Allergies  Nicoderm; Gabapentin; and Vicodin  Home Medications   Prior to Admission medications   Medication Sig Start Date End Date Taking? Authorizing Provider  albuterol (PROVENTIL HFA;VENTOLIN HFA) 108 (90 BASE) MCG/ACT inhaler Inhale  1 puff into the lungs every 6 (six) hours as needed for wheezing or shortness of breath.    Historical Provider, MD  Ascorbic Acid (VITAMIN C) 1000 MG tablet Take 1,000 mg by mouth daily.    Historical Provider, MD  aspirin 81 MG chewable tablet Chew 81 mg by mouth every other day.     Historical Provider, MD  cholecalciferol (VITAMIN D) 1000 UNITS tablet Take 1,000 Units by mouth daily.    Historical Provider, MD  darbepoetin (ARANESP) 100 MCG/0.5ML SOLN Inject 0.5 mLs (100 mcg total) into the skin every Monday. 06/24/12   Timmothy Euler, MD  ethyl chloride spray Apply 1 application topically daily as needed (prior to dialysis). Use spray on arm prior to dialysis if unable to use gel one hour prior to dialysis. 04/22/14   Historical Provider, MD  insulin lispro (HUMALOG) 100 UNIT/ML injection Inject 2-3 Units into the skin 2 (two) times daily as needed for high blood sugar (CBG >180). Per sliding scale: inject 2 units subcutaneously for CBG 180-299, inject 3 units for CBG >300 08/13/10   Zenia Resides, MD  LANTUS 100 UNIT/ML injection INJECT 6 UNITS INTO THE SKIN DAILY Patient taking differently: INJECT 30 UNITS INTO THE SKIN BEFORE BREAKFAST IF NEEDED FOR CBG >140 10/30/14   [name], MD  lidocaine-EPINEPHrine-tetracaine (LET) GEL Apply topically to left arm 1 hour before dialysis Patient taking differently: Apply topically daily as needed (prior to dialysis). Apply topically to left arm 1 hour before dialysis 03/20/14   Vivi Barrack, MD  multivitamin (RENA-VIT) TABS tablet Take 1 tablet by mouth daily.  04/12/14   Historical Provider, MD  oxyCODONE-acetaminophen (PERCOCET) 7.5-325 MG per tablet Take one by mouth four times a day as needed for pain do not fill before January 21 2015 Patient taking differently: Take 1 tablet by mouth every 6 (six) hours. Scheduled, do not fill before January 21 2015 01/05/15   [name], MD  varenicline (CHANTIX) 1 MG tablet Take 1 mg by mouth daily.     Historical Provider, MD   BP 148/59 mmHg  Pulse 88  Temp(Src) 99.3 F (37.4 C) (Oral)  Resp 14  SpO2 92% Physical Exam  Constitutional: He is oriented to person, place, and time. He appears well-developed and well-nourished. No distress.  HENT:  Head: Normocephalic and atraumatic.  Mouth/Throat: Oropharynx is clear and moist.  Eyes: Conjunctivae and EOM are normal. Pupils are equal, round, and reactive to light.  Neck: Normal range of motion. Neck supple.  Cardiovascular: Normal rate and intact distal pulses.   Pulmonary/Chest: Effort normal. No respiratory distress.  Abdominal: Soft. He exhibits no distension. There is no tenderness. There is no rebound and no  guarding.  Musculoskeletal: Normal range of motion. He exhibits no edema or tenderness.  Left foot is warm to touch but digits 2-5 are cool and black in color.  No cap refill.  dopplered DP and pt pulse on the left.  Digits 3-5 amputated on the right foot  Neurological: He is alert and oriented to person, place, and time.  Skin: Skin is warm and dry. No rash noted. No erythema.  Psychiatric: He has a normal mood and affect. His behavior is normal.  Nursing note and vitals reviewed.   ED Course  Procedures (including critical care time) Labs Review Labs Reviewed  CBC WITH DIFFERENTIAL/PLATELET - Abnormal; Notable for the following:    RBC 2.87 (*)    Hemoglobin 8.8 (*)    HCT 28.5 (*)    All other components within normal limits  BASIC METABOLIC PANEL - Abnormal; Notable for the following:    Potassium 3.4 (*)    Chloride 95 (*)    CO2 33 (*)    Glucose, Bld 169 (*)    Creatinine, Ser 5.57 (*)    GFR calc non Af Amer 10 (*)    GFR calc Af Amer 12 (*)    All other components within normal limits  PROTIME-INR  APTT  HEPARIN LEVEL (UNFRACTIONATED)    Imaging Review No results found. I have personally reviewed and evaluated these images and lab results as part of my medical decision-making.   EKG  Interpretation None      MDM   Final diagnoses:  Limb ischemia    Patient with a history of dialysis Tuesday Thursday Saturday with peripheral vascular disease recently with admission to [facility] and stenting 2 separate times this month presents today with the discoloration of his left toes. Since yesterday started to notice his toes looking darker. Today patient appears to have ischemic digits from 2-5. They're cool to the touch but there is normal flow to the foot with dopplerable DP and PT pulses. Discussed with Dr. Andree Elk PA at [facility] and they will accept the patient in transfer. Patient was also started on heparin.  CRITICAL CARE Performed by: Blanchie Dessert Total critical care time: 30 Critical care time was exclusive of separately billable procedures and treating other patients. Critical care was necessary to treat or prevent imminent or life-threatening deterioration. Critical care was time spent personally by me on the following activities: development of treatment plan with patient and/or surrogate as well as nursing, discussions with consultants, evaluation of patient's response to treatment, examination of patient, obtaining history from patient or surrogate, ordering and performing treatments and interventions, ordering and review of laboratory studies, ordering and review of radiographic studies, pulse oximetry and re-evaluation of patient's condition.   Blanchie Dessert, MD 02/02/15 [place], MD 02/02/15 [phone]

## 2015-02-02 NOTE — ED Notes (Signed)
Pt presents with discoloration to L foot x 2-3 days.  Pt had stents placed to same leg x 1 month ago in [place], was admitted x 1 week ago here, reports doppler was clear; pt reports he was transferred back to [facility], received another stent to L knee.

## 2015-02-07 DIAGNOSIS — Z992 Dependence on renal dialysis: Secondary | ICD-10-CM | POA: Diagnosis not present

## 2015-02-07 DIAGNOSIS — N186 End stage renal disease: Secondary | ICD-10-CM | POA: Diagnosis not present

## 2015-02-07 DIAGNOSIS — E1129 Type 2 diabetes mellitus with other diabetic kidney complication: Secondary | ICD-10-CM | POA: Diagnosis not present

## 2015-02-08 DIAGNOSIS — D509 Iron deficiency anemia, unspecified: Secondary | ICD-10-CM | POA: Diagnosis not present

## 2015-02-08 DIAGNOSIS — N2581 Secondary hyperparathyroidism of renal origin: Secondary | ICD-10-CM | POA: Diagnosis not present

## 2015-02-08 DIAGNOSIS — D688 Other specified coagulation defects: Secondary | ICD-10-CM | POA: Diagnosis not present

## 2015-02-08 DIAGNOSIS — D689 Coagulation defect, unspecified: Secondary | ICD-10-CM | POA: Diagnosis not present

## 2015-02-08 DIAGNOSIS — R52 Pain, unspecified: Secondary | ICD-10-CM | POA: Diagnosis not present

## 2015-02-08 DIAGNOSIS — D631 Anemia in chronic kidney disease: Secondary | ICD-10-CM | POA: Diagnosis not present

## 2015-02-08 DIAGNOSIS — N186 End stage renal disease: Secondary | ICD-10-CM | POA: Diagnosis not present

## 2015-02-08 DIAGNOSIS — E119 Type 2 diabetes mellitus without complications: Secondary | ICD-10-CM | POA: Diagnosis not present

## 2015-02-10 DIAGNOSIS — D509 Iron deficiency anemia, unspecified: Secondary | ICD-10-CM | POA: Diagnosis not present

## 2015-02-10 DIAGNOSIS — D631 Anemia in chronic kidney disease: Secondary | ICD-10-CM | POA: Diagnosis not present

## 2015-02-10 DIAGNOSIS — N186 End stage renal disease: Secondary | ICD-10-CM | POA: Diagnosis not present

## 2015-02-10 DIAGNOSIS — R52 Pain, unspecified: Secondary | ICD-10-CM | POA: Diagnosis not present

## 2015-02-10 DIAGNOSIS — D688 Other specified coagulation defects: Secondary | ICD-10-CM | POA: Diagnosis not present

## 2015-02-10 DIAGNOSIS — E119 Type 2 diabetes mellitus without complications: Secondary | ICD-10-CM | POA: Diagnosis not present

## 2015-02-10 DIAGNOSIS — N2581 Secondary hyperparathyroidism of renal origin: Secondary | ICD-10-CM | POA: Diagnosis not present

## 2015-02-10 DIAGNOSIS — D689 Coagulation defect, unspecified: Secondary | ICD-10-CM | POA: Diagnosis not present

## 2015-02-11 DIAGNOSIS — E1129 Type 2 diabetes mellitus with other diabetic kidney complication: Secondary | ICD-10-CM | POA: Diagnosis not present

## 2015-02-11 DIAGNOSIS — I739 Peripheral vascular disease, unspecified: Secondary | ICD-10-CM | POA: Diagnosis not present

## 2015-02-11 DIAGNOSIS — M549 Dorsalgia, unspecified: Secondary | ICD-10-CM | POA: Diagnosis not present

## 2015-02-11 DIAGNOSIS — N186 End stage renal disease: Secondary | ICD-10-CM | POA: Diagnosis not present

## 2015-02-11 DIAGNOSIS — E1140 Type 2 diabetes mellitus with diabetic neuropathy, unspecified: Secondary | ICD-10-CM | POA: Diagnosis not present

## 2015-02-13 DIAGNOSIS — E119 Type 2 diabetes mellitus without complications: Secondary | ICD-10-CM | POA: Diagnosis not present

## 2015-02-13 DIAGNOSIS — R52 Pain, unspecified: Secondary | ICD-10-CM | POA: Diagnosis not present

## 2015-02-13 DIAGNOSIS — N2581 Secondary hyperparathyroidism of renal origin: Secondary | ICD-10-CM | POA: Diagnosis not present

## 2015-02-13 DIAGNOSIS — D689 Coagulation defect, unspecified: Secondary | ICD-10-CM | POA: Diagnosis not present

## 2015-02-13 DIAGNOSIS — D631 Anemia in chronic kidney disease: Secondary | ICD-10-CM | POA: Diagnosis not present

## 2015-02-13 DIAGNOSIS — D509 Iron deficiency anemia, unspecified: Secondary | ICD-10-CM | POA: Diagnosis not present

## 2015-02-13 DIAGNOSIS — N186 End stage renal disease: Secondary | ICD-10-CM | POA: Diagnosis not present

## 2015-02-13 DIAGNOSIS — D688 Other specified coagulation defects: Secondary | ICD-10-CM | POA: Diagnosis not present

## 2015-02-14 DIAGNOSIS — Z720 Tobacco use: Secondary | ICD-10-CM | POA: Diagnosis not present

## 2015-02-14 DIAGNOSIS — Z7901 Long term (current) use of anticoagulants: Secondary | ICD-10-CM | POA: Diagnosis not present

## 2015-02-14 DIAGNOSIS — G8918 Other acute postprocedural pain: Secondary | ICD-10-CM | POA: Diagnosis not present

## 2015-02-14 DIAGNOSIS — I739 Peripheral vascular disease, unspecified: Secondary | ICD-10-CM | POA: Diagnosis not present

## 2015-02-14 DIAGNOSIS — I70268 Atherosclerosis of native arteries of extremities with gangrene, other extremity: Secondary | ICD-10-CM | POA: Diagnosis not present

## 2015-02-14 DIAGNOSIS — Z992 Dependence on renal dialysis: Secondary | ICD-10-CM | POA: Diagnosis not present

## 2015-02-14 DIAGNOSIS — D696 Thrombocytopenia, unspecified: Secondary | ICD-10-CM | POA: Diagnosis not present

## 2015-02-14 DIAGNOSIS — I10 Essential (primary) hypertension: Secondary | ICD-10-CM | POA: Diagnosis not present

## 2015-02-14 DIAGNOSIS — Z8673 Personal history of transient ischemic attack (TIA), and cerebral infarction without residual deficits: Secondary | ICD-10-CM | POA: Diagnosis not present

## 2015-02-14 DIAGNOSIS — E1122 Type 2 diabetes mellitus with diabetic chronic kidney disease: Secondary | ICD-10-CM | POA: Diagnosis not present

## 2015-02-14 DIAGNOSIS — J449 Chronic obstructive pulmonary disease, unspecified: Secondary | ICD-10-CM | POA: Diagnosis not present

## 2015-02-14 DIAGNOSIS — Z89421 Acquired absence of other right toe(s): Secondary | ICD-10-CM | POA: Diagnosis not present

## 2015-02-14 DIAGNOSIS — E1142 Type 2 diabetes mellitus with diabetic polyneuropathy: Secondary | ICD-10-CM | POA: Diagnosis not present

## 2015-02-14 DIAGNOSIS — N186 End stage renal disease: Secondary | ICD-10-CM | POA: Diagnosis not present

## 2015-02-14 DIAGNOSIS — G8928 Other chronic postprocedural pain: Secondary | ICD-10-CM | POA: Diagnosis not present

## 2015-02-14 DIAGNOSIS — F17210 Nicotine dependence, cigarettes, uncomplicated: Secondary | ICD-10-CM | POA: Diagnosis not present

## 2015-02-14 DIAGNOSIS — I7389 Other specified peripheral vascular diseases: Secondary | ICD-10-CM | POA: Diagnosis not present

## 2015-02-14 DIAGNOSIS — Z7982 Long term (current) use of aspirin: Secondary | ICD-10-CM | POA: Diagnosis not present

## 2015-02-14 DIAGNOSIS — R509 Fever, unspecified: Secondary | ICD-10-CM | POA: Diagnosis not present

## 2015-02-14 DIAGNOSIS — I998 Other disorder of circulatory system: Secondary | ICD-10-CM | POA: Diagnosis not present

## 2015-02-14 DIAGNOSIS — Z716 Tobacco abuse counseling: Secondary | ICD-10-CM | POA: Diagnosis not present

## 2015-02-14 DIAGNOSIS — E118 Type 2 diabetes mellitus with unspecified complications: Secondary | ICD-10-CM | POA: Diagnosis not present

## 2015-02-14 DIAGNOSIS — I70262 Atherosclerosis of native arteries of extremities with gangrene, left leg: Secondary | ICD-10-CM | POA: Diagnosis not present

## 2015-02-14 DIAGNOSIS — Z794 Long term (current) use of insulin: Secondary | ICD-10-CM | POA: Diagnosis not present

## 2015-02-14 DIAGNOSIS — Z8249 Family history of ischemic heart disease and other diseases of the circulatory system: Secondary | ICD-10-CM | POA: Diagnosis not present

## 2015-02-14 DIAGNOSIS — E785 Hyperlipidemia, unspecified: Secondary | ICD-10-CM | POA: Diagnosis not present

## 2015-02-14 DIAGNOSIS — M79672 Pain in left foot: Secondary | ICD-10-CM | POA: Diagnosis not present

## 2015-02-14 DIAGNOSIS — D649 Anemia, unspecified: Secondary | ICD-10-CM | POA: Diagnosis not present

## 2015-02-14 DIAGNOSIS — I96 Gangrene, not elsewhere classified: Secondary | ICD-10-CM | POA: Diagnosis not present

## 2015-02-14 DIAGNOSIS — I120 Hypertensive chronic kidney disease with stage 5 chronic kidney disease or end stage renal disease: Secondary | ICD-10-CM | POA: Diagnosis not present

## 2015-02-14 DIAGNOSIS — I779 Disorder of arteries and arterioles, unspecified: Secondary | ICD-10-CM | POA: Diagnosis not present

## 2015-02-14 DIAGNOSIS — M86172 Other acute osteomyelitis, left ankle and foot: Secondary | ICD-10-CM | POA: Diagnosis not present

## 2015-02-14 DIAGNOSIS — Z79891 Long term (current) use of opiate analgesic: Secondary | ICD-10-CM | POA: Diagnosis not present

## 2015-02-14 DIAGNOSIS — J45909 Unspecified asthma, uncomplicated: Secondary | ICD-10-CM | POA: Diagnosis not present

## 2015-02-14 DIAGNOSIS — M79662 Pain in left lower leg: Secondary | ICD-10-CM | POA: Diagnosis not present

## 2015-02-14 DIAGNOSIS — E109 Type 1 diabetes mellitus without complications: Secondary | ICD-10-CM | POA: Diagnosis not present

## 2015-02-15 DIAGNOSIS — M79662 Pain in left lower leg: Secondary | ICD-10-CM | POA: Diagnosis not present

## 2015-02-23 DIAGNOSIS — E1122 Type 2 diabetes mellitus with diabetic chronic kidney disease: Secondary | ICD-10-CM | POA: Diagnosis not present

## 2015-02-23 DIAGNOSIS — Z794 Long term (current) use of insulin: Secondary | ICD-10-CM | POA: Diagnosis not present

## 2015-02-23 DIAGNOSIS — I129 Hypertensive chronic kidney disease with stage 1 through stage 4 chronic kidney disease, or unspecified chronic kidney disease: Secondary | ICD-10-CM | POA: Diagnosis not present

## 2015-02-23 DIAGNOSIS — Z89432 Acquired absence of left foot: Secondary | ICD-10-CM | POA: Diagnosis not present

## 2015-02-23 DIAGNOSIS — Z720 Tobacco use: Secondary | ICD-10-CM | POA: Diagnosis not present

## 2015-02-23 DIAGNOSIS — Z8673 Personal history of transient ischemic attack (TIA), and cerebral infarction without residual deficits: Secondary | ICD-10-CM | POA: Diagnosis not present

## 2015-02-23 DIAGNOSIS — E1151 Type 2 diabetes mellitus with diabetic peripheral angiopathy without gangrene: Secondary | ICD-10-CM | POA: Diagnosis not present

## 2015-02-23 DIAGNOSIS — Z4781 Encounter for orthopedic aftercare following surgical amputation: Secondary | ICD-10-CM | POA: Diagnosis not present

## 2015-02-23 DIAGNOSIS — N183 Chronic kidney disease, stage 3 (moderate): Secondary | ICD-10-CM | POA: Diagnosis not present

## 2015-02-23 DIAGNOSIS — J449 Chronic obstructive pulmonary disease, unspecified: Secondary | ICD-10-CM | POA: Diagnosis not present

## 2015-02-24 DIAGNOSIS — D689 Coagulation defect, unspecified: Secondary | ICD-10-CM | POA: Diagnosis not present

## 2015-02-24 DIAGNOSIS — D509 Iron deficiency anemia, unspecified: Secondary | ICD-10-CM | POA: Diagnosis not present

## 2015-02-24 DIAGNOSIS — D631 Anemia in chronic kidney disease: Secondary | ICD-10-CM | POA: Diagnosis not present

## 2015-02-24 DIAGNOSIS — D688 Other specified coagulation defects: Secondary | ICD-10-CM | POA: Diagnosis not present

## 2015-02-24 DIAGNOSIS — E119 Type 2 diabetes mellitus without complications: Secondary | ICD-10-CM | POA: Diagnosis not present

## 2015-02-24 DIAGNOSIS — R52 Pain, unspecified: Secondary | ICD-10-CM | POA: Diagnosis not present

## 2015-02-24 DIAGNOSIS — N2581 Secondary hyperparathyroidism of renal origin: Secondary | ICD-10-CM | POA: Diagnosis not present

## 2015-02-24 DIAGNOSIS — N186 End stage renal disease: Secondary | ICD-10-CM | POA: Diagnosis not present

## 2015-02-26 ENCOUNTER — Other Ambulatory Visit: Payer: Self-pay | Admitting: Family Medicine

## 2015-02-27 ENCOUNTER — Telehealth: Payer: Self-pay | Admitting: *Deleted

## 2015-02-27 DIAGNOSIS — Z4781 Encounter for orthopedic aftercare following surgical amputation: Secondary | ICD-10-CM | POA: Diagnosis not present

## 2015-02-27 DIAGNOSIS — Z720 Tobacco use: Secondary | ICD-10-CM | POA: Diagnosis not present

## 2015-02-27 DIAGNOSIS — Z89432 Acquired absence of left foot: Secondary | ICD-10-CM | POA: Diagnosis not present

## 2015-02-27 DIAGNOSIS — E1151 Type 2 diabetes mellitus with diabetic peripheral angiopathy without gangrene: Secondary | ICD-10-CM | POA: Diagnosis not present

## 2015-02-27 DIAGNOSIS — N183 Chronic kidney disease, stage 3 (moderate): Secondary | ICD-10-CM | POA: Diagnosis not present

## 2015-02-27 DIAGNOSIS — D631 Anemia in chronic kidney disease: Secondary | ICD-10-CM | POA: Diagnosis not present

## 2015-02-27 DIAGNOSIS — Z8673 Personal history of transient ischemic attack (TIA), and cerebral infarction without residual deficits: Secondary | ICD-10-CM | POA: Diagnosis not present

## 2015-02-27 DIAGNOSIS — N186 End stage renal disease: Secondary | ICD-10-CM | POA: Diagnosis not present

## 2015-02-27 DIAGNOSIS — I129 Hypertensive chronic kidney disease with stage 1 through stage 4 chronic kidney disease, or unspecified chronic kidney disease: Secondary | ICD-10-CM | POA: Diagnosis not present

## 2015-02-27 DIAGNOSIS — D689 Coagulation defect, unspecified: Secondary | ICD-10-CM | POA: Diagnosis not present

## 2015-02-27 DIAGNOSIS — E119 Type 2 diabetes mellitus without complications: Secondary | ICD-10-CM | POA: Diagnosis not present

## 2015-02-27 DIAGNOSIS — D688 Other specified coagulation defects: Secondary | ICD-10-CM | POA: Diagnosis not present

## 2015-02-27 DIAGNOSIS — E1122 Type 2 diabetes mellitus with diabetic chronic kidney disease: Secondary | ICD-10-CM | POA: Diagnosis not present

## 2015-02-27 DIAGNOSIS — N2581 Secondary hyperparathyroidism of renal origin: Secondary | ICD-10-CM | POA: Diagnosis not present

## 2015-02-27 DIAGNOSIS — R52 Pain, unspecified: Secondary | ICD-10-CM | POA: Diagnosis not present

## 2015-02-27 DIAGNOSIS — D509 Iron deficiency anemia, unspecified: Secondary | ICD-10-CM | POA: Diagnosis not present

## 2015-02-27 DIAGNOSIS — Z794 Long term (current) use of insulin: Secondary | ICD-10-CM | POA: Diagnosis not present

## 2015-02-27 DIAGNOSIS — J449 Chronic obstructive pulmonary disease, unspecified: Secondary | ICD-10-CM | POA: Diagnosis not present

## 2015-02-27 NOTE — Telephone Encounter (Signed)
Patient was referred to PT for eval after hospital stay for surgery.  Need verbal order for PT in home BIW x 3 weeks, 1 x week for 3 weeks.  Also, need OT order for eval for ADL's.  Will route request to Dr. Nori Riis.  Burna Forts, BSN, RN-BC

## 2015-02-27 NOTE — Telephone Encounter (Signed)
Ok to give verbal orders. I have already signed some stuff. If there is somethiing else to sign, send it  To me [facility]! [name]

## 2015-02-28 ENCOUNTER — Telehealth: Payer: Self-pay | Admitting: Family Medicine

## 2015-02-28 NOTE — Telephone Encounter (Signed)
Forgot to ask about pt getting a CNA, wasn't sure if Dr. Nori Riis would need a form or if she has one here that can be signed.

## 2015-03-01 DIAGNOSIS — N2581 Secondary hyperparathyroidism of renal origin: Secondary | ICD-10-CM | POA: Diagnosis not present

## 2015-03-01 DIAGNOSIS — E119 Type 2 diabetes mellitus without complications: Secondary | ICD-10-CM | POA: Diagnosis not present

## 2015-03-01 DIAGNOSIS — R52 Pain, unspecified: Secondary | ICD-10-CM | POA: Diagnosis not present

## 2015-03-01 DIAGNOSIS — D509 Iron deficiency anemia, unspecified: Secondary | ICD-10-CM | POA: Diagnosis not present

## 2015-03-01 DIAGNOSIS — D689 Coagulation defect, unspecified: Secondary | ICD-10-CM | POA: Diagnosis not present

## 2015-03-01 DIAGNOSIS — N186 End stage renal disease: Secondary | ICD-10-CM | POA: Diagnosis not present

## 2015-03-01 DIAGNOSIS — D688 Other specified coagulation defects: Secondary | ICD-10-CM | POA: Diagnosis not present

## 2015-03-01 DIAGNOSIS — D631 Anemia in chronic kidney disease: Secondary | ICD-10-CM | POA: Diagnosis not present

## 2015-03-02 ENCOUNTER — Other Ambulatory Visit: Payer: Self-pay | Admitting: *Deleted

## 2015-03-02 DIAGNOSIS — N183 Chronic kidney disease, stage 3 (moderate): Secondary | ICD-10-CM | POA: Diagnosis not present

## 2015-03-02 DIAGNOSIS — E1122 Type 2 diabetes mellitus with diabetic chronic kidney disease: Secondary | ICD-10-CM | POA: Diagnosis not present

## 2015-03-02 DIAGNOSIS — Z8673 Personal history of transient ischemic attack (TIA), and cerebral infarction without residual deficits: Secondary | ICD-10-CM | POA: Diagnosis not present

## 2015-03-02 DIAGNOSIS — Z794 Long term (current) use of insulin: Secondary | ICD-10-CM | POA: Diagnosis not present

## 2015-03-02 DIAGNOSIS — Z720 Tobacco use: Secondary | ICD-10-CM | POA: Diagnosis not present

## 2015-03-02 DIAGNOSIS — I129 Hypertensive chronic kidney disease with stage 1 through stage 4 chronic kidney disease, or unspecified chronic kidney disease: Secondary | ICD-10-CM | POA: Diagnosis not present

## 2015-03-02 DIAGNOSIS — J449 Chronic obstructive pulmonary disease, unspecified: Secondary | ICD-10-CM | POA: Diagnosis not present

## 2015-03-02 DIAGNOSIS — E1151 Type 2 diabetes mellitus with diabetic peripheral angiopathy without gangrene: Secondary | ICD-10-CM | POA: Diagnosis not present

## 2015-03-02 DIAGNOSIS — Z4781 Encounter for orthopedic aftercare following surgical amputation: Secondary | ICD-10-CM | POA: Diagnosis not present

## 2015-03-02 DIAGNOSIS — Z89432 Acquired absence of left foot: Secondary | ICD-10-CM | POA: Diagnosis not present

## 2015-03-02 NOTE — Telephone Encounter (Signed)
LVM to for Clair Gulling (PT)  to call back so we can give the verbal orders for this pt per Dr. Nori Riis below message.  Katharina Caper, April D, [place]

## 2015-03-02 NOTE — Telephone Encounter (Signed)
Dear Dema Severin Team Is he talking about someone to help dress his foot?Or is he talking about [place] where someone comes in a couple hours a week to help with other stuff? If it is personal care services, I can order an eval by a PCS provider--they then determine if eligible. If it is a CNA to dress his foot, that is a different type of request.  Just let me know which and I will order  THANKS! [name]

## 2015-03-02 NOTE — Telephone Encounter (Signed)
Spoke to John [name]. He said he needs someone to help with cooking and other things. He has had a nurse that comes in and help with the dressing. Ottis Stain, CMA

## 2015-03-03 DIAGNOSIS — N2581 Secondary hyperparathyroidism of renal origin: Secondary | ICD-10-CM | POA: Diagnosis not present

## 2015-03-03 DIAGNOSIS — D688 Other specified coagulation defects: Secondary | ICD-10-CM | POA: Diagnosis not present

## 2015-03-03 DIAGNOSIS — D631 Anemia in chronic kidney disease: Secondary | ICD-10-CM | POA: Diagnosis not present

## 2015-03-03 DIAGNOSIS — D689 Coagulation defect, unspecified: Secondary | ICD-10-CM | POA: Diagnosis not present

## 2015-03-03 DIAGNOSIS — D509 Iron deficiency anemia, unspecified: Secondary | ICD-10-CM | POA: Diagnosis not present

## 2015-03-03 DIAGNOSIS — R52 Pain, unspecified: Secondary | ICD-10-CM | POA: Diagnosis not present

## 2015-03-03 DIAGNOSIS — N186 End stage renal disease: Secondary | ICD-10-CM | POA: Diagnosis not present

## 2015-03-03 DIAGNOSIS — E119 Type 2 diabetes mellitus without complications: Secondary | ICD-10-CM | POA: Diagnosis not present

## 2015-03-05 DIAGNOSIS — Z4781 Encounter for orthopedic aftercare following surgical amputation: Secondary | ICD-10-CM | POA: Diagnosis not present

## 2015-03-05 DIAGNOSIS — N183 Chronic kidney disease, stage 3 (moderate): Secondary | ICD-10-CM | POA: Diagnosis not present

## 2015-03-05 DIAGNOSIS — Z89432 Acquired absence of left foot: Secondary | ICD-10-CM | POA: Diagnosis not present

## 2015-03-05 DIAGNOSIS — J449 Chronic obstructive pulmonary disease, unspecified: Secondary | ICD-10-CM | POA: Diagnosis not present

## 2015-03-05 DIAGNOSIS — E1151 Type 2 diabetes mellitus with diabetic peripheral angiopathy without gangrene: Secondary | ICD-10-CM | POA: Diagnosis not present

## 2015-03-05 DIAGNOSIS — I129 Hypertensive chronic kidney disease with stage 1 through stage 4 chronic kidney disease, or unspecified chronic kidney disease: Secondary | ICD-10-CM | POA: Diagnosis not present

## 2015-03-05 DIAGNOSIS — Z8673 Personal history of transient ischemic attack (TIA), and cerebral infarction without residual deficits: Secondary | ICD-10-CM | POA: Diagnosis not present

## 2015-03-05 DIAGNOSIS — Z720 Tobacco use: Secondary | ICD-10-CM | POA: Diagnosis not present

## 2015-03-05 DIAGNOSIS — Z794 Long term (current) use of insulin: Secondary | ICD-10-CM | POA: Diagnosis not present

## 2015-03-05 DIAGNOSIS — E1122 Type 2 diabetes mellitus with diabetic chronic kidney disease: Secondary | ICD-10-CM | POA: Diagnosis not present

## 2015-03-05 NOTE — Telephone Encounter (Signed)
Constance Holster Remind me how to get him evaluated for personal care services? THANKS! [name]

## 2015-03-05 NOTE — Telephone Encounter (Signed)
Message given to Jim.

## 2015-03-06 ENCOUNTER — Other Ambulatory Visit: Payer: Self-pay | Admitting: Family Medicine

## 2015-03-06 DIAGNOSIS — N186 End stage renal disease: Secondary | ICD-10-CM | POA: Diagnosis not present

## 2015-03-06 DIAGNOSIS — N2581 Secondary hyperparathyroidism of renal origin: Secondary | ICD-10-CM | POA: Diagnosis not present

## 2015-03-06 DIAGNOSIS — D688 Other specified coagulation defects: Secondary | ICD-10-CM | POA: Diagnosis not present

## 2015-03-06 DIAGNOSIS — R52 Pain, unspecified: Secondary | ICD-10-CM | POA: Diagnosis not present

## 2015-03-06 DIAGNOSIS — D509 Iron deficiency anemia, unspecified: Secondary | ICD-10-CM | POA: Diagnosis not present

## 2015-03-06 DIAGNOSIS — E119 Type 2 diabetes mellitus without complications: Secondary | ICD-10-CM | POA: Diagnosis not present

## 2015-03-06 DIAGNOSIS — D689 Coagulation defect, unspecified: Secondary | ICD-10-CM | POA: Diagnosis not present

## 2015-03-06 DIAGNOSIS — D631 Anemia in chronic kidney disease: Secondary | ICD-10-CM | POA: Diagnosis not present

## 2015-03-06 NOTE — Telephone Encounter (Signed)
Form dropped off to be filled out for personal care services.  Please call when completed.

## 2015-03-06 NOTE — Telephone Encounter (Signed)
Dr. Nori Riis, I have placed the PCS form in your box for completion. The state requires that the pt have had a physician visit within 90 days of the application. Today is his 90th day so there could be a chance that they fax the application back and request for him to have an updated PCP visit.  Hunt Oris, MSW, [place]

## 2015-03-07 DIAGNOSIS — E1122 Type 2 diabetes mellitus with diabetic chronic kidney disease: Secondary | ICD-10-CM | POA: Diagnosis not present

## 2015-03-07 DIAGNOSIS — N183 Chronic kidney disease, stage 3 (moderate): Secondary | ICD-10-CM | POA: Diagnosis not present

## 2015-03-07 DIAGNOSIS — E1151 Type 2 diabetes mellitus with diabetic peripheral angiopathy without gangrene: Secondary | ICD-10-CM | POA: Diagnosis not present

## 2015-03-07 DIAGNOSIS — Z720 Tobacco use: Secondary | ICD-10-CM | POA: Diagnosis not present

## 2015-03-07 DIAGNOSIS — Z794 Long term (current) use of insulin: Secondary | ICD-10-CM | POA: Diagnosis not present

## 2015-03-07 DIAGNOSIS — Z4781 Encounter for orthopedic aftercare following surgical amputation: Secondary | ICD-10-CM | POA: Diagnosis not present

## 2015-03-07 DIAGNOSIS — J449 Chronic obstructive pulmonary disease, unspecified: Secondary | ICD-10-CM | POA: Diagnosis not present

## 2015-03-07 DIAGNOSIS — I129 Hypertensive chronic kidney disease with stage 1 through stage 4 chronic kidney disease, or unspecified chronic kidney disease: Secondary | ICD-10-CM | POA: Diagnosis not present

## 2015-03-07 DIAGNOSIS — Z8673 Personal history of transient ischemic attack (TIA), and cerebral infarction without residual deficits: Secondary | ICD-10-CM | POA: Diagnosis not present

## 2015-03-07 DIAGNOSIS — Z89432 Acquired absence of left foot: Secondary | ICD-10-CM | POA: Diagnosis not present

## 2015-03-07 NOTE — Telephone Encounter (Signed)
Forms placed in PCP box. Zimmerman Rumple, April D, CMA  

## 2015-03-08 DIAGNOSIS — D631 Anemia in chronic kidney disease: Secondary | ICD-10-CM | POA: Diagnosis not present

## 2015-03-08 DIAGNOSIS — R52 Pain, unspecified: Secondary | ICD-10-CM | POA: Diagnosis not present

## 2015-03-08 DIAGNOSIS — Z794 Long term (current) use of insulin: Secondary | ICD-10-CM | POA: Diagnosis not present

## 2015-03-08 DIAGNOSIS — D509 Iron deficiency anemia, unspecified: Secondary | ICD-10-CM | POA: Diagnosis not present

## 2015-03-08 DIAGNOSIS — N183 Chronic kidney disease, stage 3 (moderate): Secondary | ICD-10-CM | POA: Diagnosis not present

## 2015-03-08 DIAGNOSIS — I129 Hypertensive chronic kidney disease with stage 1 through stage 4 chronic kidney disease, or unspecified chronic kidney disease: Secondary | ICD-10-CM | POA: Diagnosis not present

## 2015-03-08 DIAGNOSIS — Z720 Tobacco use: Secondary | ICD-10-CM | POA: Diagnosis not present

## 2015-03-08 DIAGNOSIS — D688 Other specified coagulation defects: Secondary | ICD-10-CM | POA: Diagnosis not present

## 2015-03-08 DIAGNOSIS — D689 Coagulation defect, unspecified: Secondary | ICD-10-CM | POA: Diagnosis not present

## 2015-03-08 DIAGNOSIS — N186 End stage renal disease: Secondary | ICD-10-CM | POA: Diagnosis not present

## 2015-03-08 DIAGNOSIS — Z8673 Personal history of transient ischemic attack (TIA), and cerebral infarction without residual deficits: Secondary | ICD-10-CM | POA: Diagnosis not present

## 2015-03-08 DIAGNOSIS — Z89432 Acquired absence of left foot: Secondary | ICD-10-CM | POA: Diagnosis not present

## 2015-03-08 DIAGNOSIS — E1151 Type 2 diabetes mellitus with diabetic peripheral angiopathy without gangrene: Secondary | ICD-10-CM | POA: Diagnosis not present

## 2015-03-08 DIAGNOSIS — J449 Chronic obstructive pulmonary disease, unspecified: Secondary | ICD-10-CM | POA: Diagnosis not present

## 2015-03-08 DIAGNOSIS — Z4781 Encounter for orthopedic aftercare following surgical amputation: Secondary | ICD-10-CM | POA: Diagnosis not present

## 2015-03-08 DIAGNOSIS — N2581 Secondary hyperparathyroidism of renal origin: Secondary | ICD-10-CM | POA: Diagnosis not present

## 2015-03-08 DIAGNOSIS — E119 Type 2 diabetes mellitus without complications: Secondary | ICD-10-CM | POA: Diagnosis not present

## 2015-03-08 DIAGNOSIS — E1122 Type 2 diabetes mellitus with diabetic chronic kidney disease: Secondary | ICD-10-CM | POA: Diagnosis not present

## 2015-03-09 DIAGNOSIS — I129 Hypertensive chronic kidney disease with stage 1 through stage 4 chronic kidney disease, or unspecified chronic kidney disease: Secondary | ICD-10-CM | POA: Diagnosis not present

## 2015-03-09 DIAGNOSIS — Z4781 Encounter for orthopedic aftercare following surgical amputation: Secondary | ICD-10-CM | POA: Diagnosis not present

## 2015-03-09 DIAGNOSIS — Z8673 Personal history of transient ischemic attack (TIA), and cerebral infarction without residual deficits: Secondary | ICD-10-CM | POA: Diagnosis not present

## 2015-03-09 DIAGNOSIS — Z89432 Acquired absence of left foot: Secondary | ICD-10-CM | POA: Diagnosis not present

## 2015-03-09 DIAGNOSIS — E1129 Type 2 diabetes mellitus with other diabetic kidney complication: Secondary | ICD-10-CM | POA: Diagnosis not present

## 2015-03-09 DIAGNOSIS — N186 End stage renal disease: Secondary | ICD-10-CM | POA: Diagnosis not present

## 2015-03-09 DIAGNOSIS — J449 Chronic obstructive pulmonary disease, unspecified: Secondary | ICD-10-CM | POA: Diagnosis not present

## 2015-03-09 DIAGNOSIS — Z992 Dependence on renal dialysis: Secondary | ICD-10-CM | POA: Diagnosis not present

## 2015-03-09 DIAGNOSIS — E1151 Type 2 diabetes mellitus with diabetic peripheral angiopathy without gangrene: Secondary | ICD-10-CM | POA: Diagnosis not present

## 2015-03-09 DIAGNOSIS — E1122 Type 2 diabetes mellitus with diabetic chronic kidney disease: Secondary | ICD-10-CM | POA: Diagnosis not present

## 2015-03-09 DIAGNOSIS — Z794 Long term (current) use of insulin: Secondary | ICD-10-CM | POA: Diagnosis not present

## 2015-03-09 DIAGNOSIS — Z720 Tobacco use: Secondary | ICD-10-CM | POA: Diagnosis not present

## 2015-03-09 DIAGNOSIS — N183 Chronic kidney disease, stage 3 (moderate): Secondary | ICD-10-CM | POA: Diagnosis not present

## 2015-03-10 DIAGNOSIS — E119 Type 2 diabetes mellitus without complications: Secondary | ICD-10-CM | POA: Diagnosis not present

## 2015-03-10 DIAGNOSIS — D689 Coagulation defect, unspecified: Secondary | ICD-10-CM | POA: Diagnosis not present

## 2015-03-10 DIAGNOSIS — N186 End stage renal disease: Secondary | ICD-10-CM | POA: Diagnosis not present

## 2015-03-10 DIAGNOSIS — R52 Pain, unspecified: Secondary | ICD-10-CM | POA: Diagnosis not present

## 2015-03-10 DIAGNOSIS — D631 Anemia in chronic kidney disease: Secondary | ICD-10-CM | POA: Diagnosis not present

## 2015-03-10 DIAGNOSIS — D509 Iron deficiency anemia, unspecified: Secondary | ICD-10-CM | POA: Diagnosis not present

## 2015-03-10 DIAGNOSIS — D688 Other specified coagulation defects: Secondary | ICD-10-CM | POA: Diagnosis not present

## 2015-03-11 DIAGNOSIS — E785 Hyperlipidemia, unspecified: Secondary | ICD-10-CM | POA: Diagnosis not present

## 2015-03-11 DIAGNOSIS — T814XXA Infection following a procedure, initial encounter: Secondary | ICD-10-CM | POA: Diagnosis not present

## 2015-03-11 DIAGNOSIS — Z720 Tobacco use: Secondary | ICD-10-CM | POA: Diagnosis not present

## 2015-03-11 DIAGNOSIS — Z89432 Acquired absence of left foot: Secondary | ICD-10-CM | POA: Diagnosis not present

## 2015-03-11 DIAGNOSIS — R609 Edema, unspecified: Secondary | ICD-10-CM | POA: Diagnosis not present

## 2015-03-11 DIAGNOSIS — E1151 Type 2 diabetes mellitus with diabetic peripheral angiopathy without gangrene: Secondary | ICD-10-CM | POA: Diagnosis not present

## 2015-03-11 DIAGNOSIS — Z7982 Long term (current) use of aspirin: Secondary | ICD-10-CM | POA: Diagnosis not present

## 2015-03-11 DIAGNOSIS — J449 Chronic obstructive pulmonary disease, unspecified: Secondary | ICD-10-CM | POA: Diagnosis not present

## 2015-03-11 DIAGNOSIS — M79672 Pain in left foot: Secondary | ICD-10-CM | POA: Diagnosis not present

## 2015-03-11 DIAGNOSIS — Z7902 Long term (current) use of antithrombotics/antiplatelets: Secondary | ICD-10-CM | POA: Diagnosis not present

## 2015-03-11 DIAGNOSIS — I129 Hypertensive chronic kidney disease with stage 1 through stage 4 chronic kidney disease, or unspecified chronic kidney disease: Secondary | ICD-10-CM | POA: Diagnosis not present

## 2015-03-11 DIAGNOSIS — Z4781 Encounter for orthopedic aftercare following surgical amputation: Secondary | ICD-10-CM | POA: Diagnosis not present

## 2015-03-11 DIAGNOSIS — Z89421 Acquired absence of other right toe(s): Secondary | ICD-10-CM | POA: Diagnosis not present

## 2015-03-11 DIAGNOSIS — I739 Peripheral vascular disease, unspecified: Secondary | ICD-10-CM | POA: Diagnosis not present

## 2015-03-11 DIAGNOSIS — F17210 Nicotine dependence, cigarettes, uncomplicated: Secondary | ICD-10-CM | POA: Diagnosis not present

## 2015-03-11 DIAGNOSIS — Z8673 Personal history of transient ischemic attack (TIA), and cerebral infarction without residual deficits: Secondary | ICD-10-CM | POA: Diagnosis not present

## 2015-03-11 DIAGNOSIS — Z951 Presence of aortocoronary bypass graft: Secondary | ICD-10-CM | POA: Diagnosis not present

## 2015-03-11 DIAGNOSIS — N183 Chronic kidney disease, stage 3 (moderate): Secondary | ICD-10-CM | POA: Diagnosis not present

## 2015-03-11 DIAGNOSIS — E119 Type 2 diabetes mellitus without complications: Secondary | ICD-10-CM | POA: Diagnosis not present

## 2015-03-11 DIAGNOSIS — E1122 Type 2 diabetes mellitus with diabetic chronic kidney disease: Secondary | ICD-10-CM | POA: Diagnosis not present

## 2015-03-11 DIAGNOSIS — Z794 Long term (current) use of insulin: Secondary | ICD-10-CM | POA: Diagnosis not present

## 2015-03-11 DIAGNOSIS — Z89422 Acquired absence of other left toe(s): Secondary | ICD-10-CM | POA: Diagnosis not present

## 2015-03-11 DIAGNOSIS — Z01818 Encounter for other preprocedural examination: Secondary | ICD-10-CM | POA: Diagnosis not present

## 2015-03-11 DIAGNOSIS — Z992 Dependence on renal dialysis: Secondary | ICD-10-CM | POA: Diagnosis not present

## 2015-03-12 NOTE — Telephone Encounter (Signed)
Need refill on his Oxycodone rx

## 2015-03-13 DIAGNOSIS — D631 Anemia in chronic kidney disease: Secondary | ICD-10-CM | POA: Diagnosis not present

## 2015-03-13 DIAGNOSIS — R52 Pain, unspecified: Secondary | ICD-10-CM | POA: Diagnosis not present

## 2015-03-13 DIAGNOSIS — Z89432 Acquired absence of left foot: Secondary | ICD-10-CM | POA: Diagnosis not present

## 2015-03-13 DIAGNOSIS — D689 Coagulation defect, unspecified: Secondary | ICD-10-CM | POA: Diagnosis not present

## 2015-03-13 DIAGNOSIS — J449 Chronic obstructive pulmonary disease, unspecified: Secondary | ICD-10-CM | POA: Diagnosis not present

## 2015-03-13 DIAGNOSIS — D688 Other specified coagulation defects: Secondary | ICD-10-CM | POA: Diagnosis not present

## 2015-03-13 DIAGNOSIS — Z8673 Personal history of transient ischemic attack (TIA), and cerebral infarction without residual deficits: Secondary | ICD-10-CM | POA: Diagnosis not present

## 2015-03-13 DIAGNOSIS — Z794 Long term (current) use of insulin: Secondary | ICD-10-CM | POA: Diagnosis not present

## 2015-03-13 DIAGNOSIS — Z720 Tobacco use: Secondary | ICD-10-CM | POA: Diagnosis not present

## 2015-03-13 DIAGNOSIS — E119 Type 2 diabetes mellitus without complications: Secondary | ICD-10-CM | POA: Diagnosis not present

## 2015-03-13 DIAGNOSIS — D509 Iron deficiency anemia, unspecified: Secondary | ICD-10-CM | POA: Diagnosis not present

## 2015-03-13 DIAGNOSIS — E1151 Type 2 diabetes mellitus with diabetic peripheral angiopathy without gangrene: Secondary | ICD-10-CM | POA: Diagnosis not present

## 2015-03-13 DIAGNOSIS — I129 Hypertensive chronic kidney disease with stage 1 through stage 4 chronic kidney disease, or unspecified chronic kidney disease: Secondary | ICD-10-CM | POA: Diagnosis not present

## 2015-03-13 DIAGNOSIS — E1122 Type 2 diabetes mellitus with diabetic chronic kidney disease: Secondary | ICD-10-CM | POA: Diagnosis not present

## 2015-03-13 DIAGNOSIS — N186 End stage renal disease: Secondary | ICD-10-CM | POA: Diagnosis not present

## 2015-03-13 DIAGNOSIS — Z4781 Encounter for orthopedic aftercare following surgical amputation: Secondary | ICD-10-CM | POA: Diagnosis not present

## 2015-03-13 DIAGNOSIS — N183 Chronic kidney disease, stage 3 (moderate): Secondary | ICD-10-CM | POA: Diagnosis not present

## 2015-03-14 ENCOUNTER — Other Ambulatory Visit: Payer: Self-pay | Admitting: Family Medicine

## 2015-03-14 MED ORDER — OXYCODONE-ACETAMINOPHEN 7.5-325 MG PO TABS: ORAL_TABLET | ORAL | Status: DC

## 2015-03-15 DIAGNOSIS — D688 Other specified coagulation defects: Secondary | ICD-10-CM | POA: Diagnosis not present

## 2015-03-15 DIAGNOSIS — E119 Type 2 diabetes mellitus without complications: Secondary | ICD-10-CM | POA: Diagnosis not present

## 2015-03-15 DIAGNOSIS — D689 Coagulation defect, unspecified: Secondary | ICD-10-CM | POA: Diagnosis not present

## 2015-03-15 DIAGNOSIS — N186 End stage renal disease: Secondary | ICD-10-CM | POA: Diagnosis not present

## 2015-03-15 DIAGNOSIS — R52 Pain, unspecified: Secondary | ICD-10-CM | POA: Diagnosis not present

## 2015-03-15 DIAGNOSIS — D631 Anemia in chronic kidney disease: Secondary | ICD-10-CM | POA: Diagnosis not present

## 2015-03-15 DIAGNOSIS — D509 Iron deficiency anemia, unspecified: Secondary | ICD-10-CM | POA: Diagnosis not present

## 2015-03-15 NOTE — Telephone Encounter (Signed)
Gave refill to his wife yesterday [name]

## 2015-03-16 DIAGNOSIS — I129 Hypertensive chronic kidney disease with stage 1 through stage 4 chronic kidney disease, or unspecified chronic kidney disease: Secondary | ICD-10-CM | POA: Diagnosis not present

## 2015-03-16 DIAGNOSIS — N183 Chronic kidney disease, stage 3 (moderate): Secondary | ICD-10-CM | POA: Diagnosis not present

## 2015-03-16 DIAGNOSIS — Z4781 Encounter for orthopedic aftercare following surgical amputation: Secondary | ICD-10-CM | POA: Diagnosis not present

## 2015-03-16 DIAGNOSIS — Z794 Long term (current) use of insulin: Secondary | ICD-10-CM | POA: Diagnosis not present

## 2015-03-16 DIAGNOSIS — E1122 Type 2 diabetes mellitus with diabetic chronic kidney disease: Secondary | ICD-10-CM | POA: Diagnosis not present

## 2015-03-16 DIAGNOSIS — Z720 Tobacco use: Secondary | ICD-10-CM | POA: Diagnosis not present

## 2015-03-16 DIAGNOSIS — E1151 Type 2 diabetes mellitus with diabetic peripheral angiopathy without gangrene: Secondary | ICD-10-CM | POA: Diagnosis not present

## 2015-03-16 DIAGNOSIS — Z89432 Acquired absence of left foot: Secondary | ICD-10-CM | POA: Diagnosis not present

## 2015-03-16 DIAGNOSIS — Z8673 Personal history of transient ischemic attack (TIA), and cerebral infarction without residual deficits: Secondary | ICD-10-CM | POA: Diagnosis not present

## 2015-03-16 DIAGNOSIS — J449 Chronic obstructive pulmonary disease, unspecified: Secondary | ICD-10-CM | POA: Diagnosis not present

## 2015-03-17 DIAGNOSIS — D688 Other specified coagulation defects: Secondary | ICD-10-CM | POA: Diagnosis not present

## 2015-03-17 DIAGNOSIS — D689 Coagulation defect, unspecified: Secondary | ICD-10-CM | POA: Diagnosis not present

## 2015-03-17 DIAGNOSIS — E119 Type 2 diabetes mellitus without complications: Secondary | ICD-10-CM | POA: Diagnosis not present

## 2015-03-17 DIAGNOSIS — R52 Pain, unspecified: Secondary | ICD-10-CM | POA: Diagnosis not present

## 2015-03-17 DIAGNOSIS — N186 End stage renal disease: Secondary | ICD-10-CM | POA: Diagnosis not present

## 2015-03-17 DIAGNOSIS — D631 Anemia in chronic kidney disease: Secondary | ICD-10-CM | POA: Diagnosis not present

## 2015-03-17 DIAGNOSIS — D509 Iron deficiency anemia, unspecified: Secondary | ICD-10-CM | POA: Diagnosis not present

## 2015-03-20 DIAGNOSIS — D509 Iron deficiency anemia, unspecified: Secondary | ICD-10-CM | POA: Diagnosis not present

## 2015-03-20 DIAGNOSIS — E119 Type 2 diabetes mellitus without complications: Secondary | ICD-10-CM | POA: Diagnosis not present

## 2015-03-20 DIAGNOSIS — R52 Pain, unspecified: Secondary | ICD-10-CM | POA: Diagnosis not present

## 2015-03-20 DIAGNOSIS — N186 End stage renal disease: Secondary | ICD-10-CM | POA: Diagnosis not present

## 2015-03-20 DIAGNOSIS — D689 Coagulation defect, unspecified: Secondary | ICD-10-CM | POA: Diagnosis not present

## 2015-03-20 DIAGNOSIS — D631 Anemia in chronic kidney disease: Secondary | ICD-10-CM | POA: Diagnosis not present

## 2015-03-20 DIAGNOSIS — D688 Other specified coagulation defects: Secondary | ICD-10-CM | POA: Diagnosis not present

## 2015-03-21 DIAGNOSIS — E785 Hyperlipidemia, unspecified: Secondary | ICD-10-CM | POA: Diagnosis not present

## 2015-03-21 DIAGNOSIS — I998 Other disorder of circulatory system: Secondary | ICD-10-CM | POA: Diagnosis not present

## 2015-03-21 DIAGNOSIS — Z01818 Encounter for other preprocedural examination: Secondary | ICD-10-CM | POA: Diagnosis not present

## 2015-03-21 DIAGNOSIS — I739 Peripheral vascular disease, unspecified: Secondary | ICD-10-CM | POA: Diagnosis not present

## 2015-03-21 DIAGNOSIS — M79605 Pain in left leg: Secondary | ICD-10-CM | POA: Diagnosis not present

## 2015-03-22 DIAGNOSIS — R52 Pain, unspecified: Secondary | ICD-10-CM | POA: Diagnosis not present

## 2015-03-22 DIAGNOSIS — D631 Anemia in chronic kidney disease: Secondary | ICD-10-CM | POA: Diagnosis not present

## 2015-03-22 DIAGNOSIS — N186 End stage renal disease: Secondary | ICD-10-CM | POA: Diagnosis not present

## 2015-03-22 DIAGNOSIS — D689 Coagulation defect, unspecified: Secondary | ICD-10-CM | POA: Diagnosis not present

## 2015-03-22 DIAGNOSIS — E119 Type 2 diabetes mellitus without complications: Secondary | ICD-10-CM | POA: Diagnosis not present

## 2015-03-22 DIAGNOSIS — D688 Other specified coagulation defects: Secondary | ICD-10-CM | POA: Diagnosis not present

## 2015-03-22 DIAGNOSIS — D509 Iron deficiency anemia, unspecified: Secondary | ICD-10-CM | POA: Diagnosis not present

## 2015-03-24 DIAGNOSIS — D689 Coagulation defect, unspecified: Secondary | ICD-10-CM | POA: Diagnosis not present

## 2015-03-24 DIAGNOSIS — R52 Pain, unspecified: Secondary | ICD-10-CM | POA: Diagnosis not present

## 2015-03-24 DIAGNOSIS — E119 Type 2 diabetes mellitus without complications: Secondary | ICD-10-CM | POA: Diagnosis not present

## 2015-03-24 DIAGNOSIS — N186 End stage renal disease: Secondary | ICD-10-CM | POA: Diagnosis not present

## 2015-03-24 DIAGNOSIS — D509 Iron deficiency anemia, unspecified: Secondary | ICD-10-CM | POA: Diagnosis not present

## 2015-03-24 DIAGNOSIS — D631 Anemia in chronic kidney disease: Secondary | ICD-10-CM | POA: Diagnosis not present

## 2015-03-24 DIAGNOSIS — D688 Other specified coagulation defects: Secondary | ICD-10-CM | POA: Diagnosis not present

## 2015-03-27 DIAGNOSIS — E119 Type 2 diabetes mellitus without complications: Secondary | ICD-10-CM | POA: Diagnosis not present

## 2015-03-27 DIAGNOSIS — D509 Iron deficiency anemia, unspecified: Secondary | ICD-10-CM | POA: Diagnosis not present

## 2015-03-27 DIAGNOSIS — N186 End stage renal disease: Secondary | ICD-10-CM | POA: Diagnosis not present

## 2015-03-27 DIAGNOSIS — D688 Other specified coagulation defects: Secondary | ICD-10-CM | POA: Diagnosis not present

## 2015-03-27 DIAGNOSIS — D631 Anemia in chronic kidney disease: Secondary | ICD-10-CM | POA: Diagnosis not present

## 2015-03-27 DIAGNOSIS — D689 Coagulation defect, unspecified: Secondary | ICD-10-CM | POA: Diagnosis not present

## 2015-03-27 DIAGNOSIS — R52 Pain, unspecified: Secondary | ICD-10-CM | POA: Diagnosis not present

## 2015-03-28 ENCOUNTER — Other Ambulatory Visit: Payer: Self-pay | Admitting: Family Medicine

## 2015-03-28 ENCOUNTER — Ambulatory Visit: Payer: Medicare Other | Admitting: Family Medicine

## 2015-03-28 MED ORDER — OXYCODONE-ACETAMINOPHEN 7.5-325 MG PO TABS: ORAL_TABLET | ORAL | Status: DC

## 2015-03-29 DIAGNOSIS — N186 End stage renal disease: Secondary | ICD-10-CM | POA: Diagnosis not present

## 2015-03-29 DIAGNOSIS — D631 Anemia in chronic kidney disease: Secondary | ICD-10-CM | POA: Diagnosis not present

## 2015-03-29 DIAGNOSIS — D689 Coagulation defect, unspecified: Secondary | ICD-10-CM | POA: Diagnosis not present

## 2015-03-29 DIAGNOSIS — D688 Other specified coagulation defects: Secondary | ICD-10-CM | POA: Diagnosis not present

## 2015-03-29 DIAGNOSIS — D509 Iron deficiency anemia, unspecified: Secondary | ICD-10-CM | POA: Diagnosis not present

## 2015-03-29 DIAGNOSIS — E119 Type 2 diabetes mellitus without complications: Secondary | ICD-10-CM | POA: Diagnosis not present

## 2015-03-29 DIAGNOSIS — R52 Pain, unspecified: Secondary | ICD-10-CM | POA: Diagnosis not present

## 2015-03-31 DIAGNOSIS — E119 Type 2 diabetes mellitus without complications: Secondary | ICD-10-CM | POA: Diagnosis not present

## 2015-03-31 DIAGNOSIS — D631 Anemia in chronic kidney disease: Secondary | ICD-10-CM | POA: Diagnosis not present

## 2015-03-31 DIAGNOSIS — R52 Pain, unspecified: Secondary | ICD-10-CM | POA: Diagnosis not present

## 2015-03-31 DIAGNOSIS — D689 Coagulation defect, unspecified: Secondary | ICD-10-CM | POA: Diagnosis not present

## 2015-03-31 DIAGNOSIS — D509 Iron deficiency anemia, unspecified: Secondary | ICD-10-CM | POA: Diagnosis not present

## 2015-03-31 DIAGNOSIS — D688 Other specified coagulation defects: Secondary | ICD-10-CM | POA: Diagnosis not present

## 2015-03-31 DIAGNOSIS — N186 End stage renal disease: Secondary | ICD-10-CM | POA: Diagnosis not present

## 2015-04-02 ENCOUNTER — Telehealth: Payer: Self-pay | Admitting: Family Medicine

## 2015-04-02 DIAGNOSIS — J45909 Unspecified asthma, uncomplicated: Secondary | ICD-10-CM | POA: Diagnosis not present

## 2015-04-02 DIAGNOSIS — I70262 Atherosclerosis of native arteries of extremities with gangrene, left leg: Secondary | ICD-10-CM | POA: Diagnosis not present

## 2015-04-02 DIAGNOSIS — E1122 Type 2 diabetes mellitus with diabetic chronic kidney disease: Secondary | ICD-10-CM | POA: Diagnosis not present

## 2015-04-02 DIAGNOSIS — T8189XA Other complications of procedures, not elsewhere classified, initial encounter: Secondary | ICD-10-CM | POA: Diagnosis not present

## 2015-04-02 DIAGNOSIS — I771 Stricture of artery: Secondary | ICD-10-CM | POA: Diagnosis not present

## 2015-04-02 DIAGNOSIS — T8781 Dehiscence of amputation stump: Secondary | ICD-10-CM | POA: Diagnosis not present

## 2015-04-02 DIAGNOSIS — T8744 Infection of amputation stump, left lower extremity: Secondary | ICD-10-CM | POA: Diagnosis not present

## 2015-04-02 DIAGNOSIS — Z01810 Encounter for preprocedural cardiovascular examination: Secondary | ICD-10-CM | POA: Diagnosis not present

## 2015-04-02 DIAGNOSIS — I120 Hypertensive chronic kidney disease with stage 5 chronic kidney disease or end stage renal disease: Secondary | ICD-10-CM | POA: Diagnosis not present

## 2015-04-02 DIAGNOSIS — Z8673 Personal history of transient ischemic attack (TIA), and cerebral infarction without residual deficits: Secondary | ICD-10-CM | POA: Diagnosis not present

## 2015-04-02 DIAGNOSIS — S91302A Unspecified open wound, left foot, initial encounter: Secondary | ICD-10-CM | POA: Diagnosis not present

## 2015-04-02 DIAGNOSIS — I10 Essential (primary) hypertension: Secondary | ICD-10-CM | POA: Diagnosis not present

## 2015-04-02 DIAGNOSIS — I70245 Atherosclerosis of native arteries of left leg with ulceration of other part of foot: Secondary | ICD-10-CM | POA: Diagnosis not present

## 2015-04-02 DIAGNOSIS — I779 Disorder of arteries and arterioles, unspecified: Secondary | ICD-10-CM | POA: Diagnosis not present

## 2015-04-02 DIAGNOSIS — Z01818 Encounter for other preprocedural examination: Secondary | ICD-10-CM | POA: Diagnosis not present

## 2015-04-02 DIAGNOSIS — Z951 Presence of aortocoronary bypass graft: Secondary | ICD-10-CM | POA: Diagnosis not present

## 2015-04-02 DIAGNOSIS — I517 Cardiomegaly: Secondary | ICD-10-CM | POA: Diagnosis not present

## 2015-04-02 DIAGNOSIS — J449 Chronic obstructive pulmonary disease, unspecified: Secondary | ICD-10-CM | POA: Diagnosis not present

## 2015-04-02 DIAGNOSIS — E785 Hyperlipidemia, unspecified: Secondary | ICD-10-CM | POA: Diagnosis not present

## 2015-04-02 DIAGNOSIS — Z89432 Acquired absence of left foot: Secondary | ICD-10-CM | POA: Diagnosis not present

## 2015-04-02 DIAGNOSIS — I96 Gangrene, not elsewhere classified: Secondary | ICD-10-CM | POA: Diagnosis not present

## 2015-04-02 DIAGNOSIS — F17200 Nicotine dependence, unspecified, uncomplicated: Secondary | ICD-10-CM | POA: Diagnosis not present

## 2015-04-02 DIAGNOSIS — N186 End stage renal disease: Secondary | ICD-10-CM | POA: Diagnosis not present

## 2015-04-02 DIAGNOSIS — Z89422 Acquired absence of other left toe(s): Secondary | ICD-10-CM | POA: Diagnosis not present

## 2015-04-02 DIAGNOSIS — Z89421 Acquired absence of other right toe(s): Secondary | ICD-10-CM | POA: Diagnosis not present

## 2015-04-02 DIAGNOSIS — I739 Peripheral vascular disease, unspecified: Secondary | ICD-10-CM | POA: Diagnosis not present

## 2015-04-02 DIAGNOSIS — Z992 Dependence on renal dialysis: Secondary | ICD-10-CM | POA: Diagnosis not present

## 2015-04-02 DIAGNOSIS — I2510 Atherosclerotic heart disease of native coronary artery without angina pectoris: Secondary | ICD-10-CM | POA: Diagnosis not present

## 2015-04-02 NOTE — Telephone Encounter (Signed)
Pt wife calling and states that provider should have paperwork to authorize more home visit hours and would like to check on the status of this request. States that pt's toes became infected once more and needed surgery and more home health visits are needed. States that they need more "caps" (asked for spelling) maybe caths?Fonda Kinder, ASA

## 2015-04-03 NOTE — Telephone Encounter (Signed)
Left voice message for patient that forms were faxed for home health services.  Derl Barrow, RN

## 2015-04-03 NOTE — Telephone Encounter (Signed)
Done John [name]  

## 2015-04-04 NOTE — Telephone Encounter (Signed)
Pt returned call. I informed of below. Pt expressed understanding and thanks. Sadie Reynolds, ASA

## 2015-04-06 ENCOUNTER — Other Ambulatory Visit: Payer: Self-pay | Admitting: Family Medicine

## 2015-04-06 DIAGNOSIS — Z89439 Acquired absence of unspecified foot: Secondary | ICD-10-CM | POA: Insufficient documentation

## 2015-04-08 DIAGNOSIS — E1151 Type 2 diabetes mellitus with diabetic peripheral angiopathy without gangrene: Secondary | ICD-10-CM | POA: Diagnosis not present

## 2015-04-08 DIAGNOSIS — Z4689 Encounter for fitting and adjustment of other specified devices: Secondary | ICD-10-CM | POA: Diagnosis not present

## 2015-04-08 DIAGNOSIS — Z7982 Long term (current) use of aspirin: Secondary | ICD-10-CM | POA: Diagnosis not present

## 2015-04-08 DIAGNOSIS — Z9181 History of falling: Secondary | ICD-10-CM | POA: Diagnosis not present

## 2015-04-08 DIAGNOSIS — F17210 Nicotine dependence, cigarettes, uncomplicated: Secondary | ICD-10-CM | POA: Diagnosis not present

## 2015-04-08 DIAGNOSIS — N186 End stage renal disease: Secondary | ICD-10-CM | POA: Diagnosis not present

## 2015-04-08 DIAGNOSIS — T8781 Dehiscence of amputation stump: Secondary | ICD-10-CM | POA: Diagnosis not present

## 2015-04-09 DIAGNOSIS — Z992 Dependence on renal dialysis: Secondary | ICD-10-CM | POA: Diagnosis not present

## 2015-04-09 DIAGNOSIS — N186 End stage renal disease: Secondary | ICD-10-CM | POA: Diagnosis not present

## 2015-04-09 DIAGNOSIS — E1129 Type 2 diabetes mellitus with other diabetic kidney complication: Secondary | ICD-10-CM | POA: Diagnosis not present

## 2015-04-10 DIAGNOSIS — N186 End stage renal disease: Secondary | ICD-10-CM | POA: Diagnosis not present

## 2015-04-10 DIAGNOSIS — E119 Type 2 diabetes mellitus without complications: Secondary | ICD-10-CM | POA: Diagnosis not present

## 2015-04-10 DIAGNOSIS — D688 Other specified coagulation defects: Secondary | ICD-10-CM | POA: Diagnosis not present

## 2015-04-10 DIAGNOSIS — D631 Anemia in chronic kidney disease: Secondary | ICD-10-CM | POA: Diagnosis not present

## 2015-04-10 DIAGNOSIS — D689 Coagulation defect, unspecified: Secondary | ICD-10-CM | POA: Diagnosis not present

## 2015-04-10 DIAGNOSIS — D509 Iron deficiency anemia, unspecified: Secondary | ICD-10-CM | POA: Diagnosis not present

## 2015-04-10 DIAGNOSIS — Z89432 Acquired absence of left foot: Secondary | ICD-10-CM | POA: Diagnosis not present

## 2015-04-10 DIAGNOSIS — Z23 Encounter for immunization: Secondary | ICD-10-CM | POA: Diagnosis not present

## 2015-04-10 DIAGNOSIS — R52 Pain, unspecified: Secondary | ICD-10-CM | POA: Diagnosis not present

## 2015-04-10 DIAGNOSIS — J449 Chronic obstructive pulmonary disease, unspecified: Secondary | ICD-10-CM | POA: Diagnosis not present

## 2015-04-10 DIAGNOSIS — R2689 Other abnormalities of gait and mobility: Secondary | ICD-10-CM | POA: Diagnosis not present

## 2015-04-11 ENCOUNTER — Telehealth: Payer: Self-pay | Admitting: *Deleted

## 2015-04-11 DIAGNOSIS — E1151 Type 2 diabetes mellitus with diabetic peripheral angiopathy without gangrene: Secondary | ICD-10-CM | POA: Diagnosis not present

## 2015-04-11 DIAGNOSIS — F17210 Nicotine dependence, cigarettes, uncomplicated: Secondary | ICD-10-CM | POA: Diagnosis not present

## 2015-04-11 DIAGNOSIS — Z9181 History of falling: Secondary | ICD-10-CM | POA: Diagnosis not present

## 2015-04-11 DIAGNOSIS — Z4689 Encounter for fitting and adjustment of other specified devices: Secondary | ICD-10-CM | POA: Diagnosis not present

## 2015-04-11 DIAGNOSIS — N186 End stage renal disease: Secondary | ICD-10-CM | POA: Diagnosis not present

## 2015-04-11 DIAGNOSIS — T8781 Dehiscence of amputation stump: Secondary | ICD-10-CM | POA: Diagnosis not present

## 2015-04-11 DIAGNOSIS — Z7982 Long term (current) use of aspirin: Secondary | ICD-10-CM | POA: Diagnosis not present

## 2015-04-11 NOTE — Telephone Encounter (Signed)
Tillie Rung, Physical Therapy with [facility] called to request verbal orders for physical therapy once a week for one week and twice a week for one week.  Verbal orders were given. Orders will faxed for the provider to sign. Derl Barrow, RN

## 2015-04-12 DIAGNOSIS — E119 Type 2 diabetes mellitus without complications: Secondary | ICD-10-CM | POA: Diagnosis not present

## 2015-04-12 DIAGNOSIS — R52 Pain, unspecified: Secondary | ICD-10-CM | POA: Diagnosis not present

## 2015-04-12 DIAGNOSIS — D631 Anemia in chronic kidney disease: Secondary | ICD-10-CM | POA: Diagnosis not present

## 2015-04-12 DIAGNOSIS — D688 Other specified coagulation defects: Secondary | ICD-10-CM | POA: Diagnosis not present

## 2015-04-12 DIAGNOSIS — D689 Coagulation defect, unspecified: Secondary | ICD-10-CM | POA: Diagnosis not present

## 2015-04-12 DIAGNOSIS — N186 End stage renal disease: Secondary | ICD-10-CM | POA: Diagnosis not present

## 2015-04-12 DIAGNOSIS — D509 Iron deficiency anemia, unspecified: Secondary | ICD-10-CM | POA: Diagnosis not present

## 2015-04-12 DIAGNOSIS — Z23 Encounter for immunization: Secondary | ICD-10-CM | POA: Diagnosis not present

## 2015-04-14 DIAGNOSIS — D689 Coagulation defect, unspecified: Secondary | ICD-10-CM | POA: Diagnosis not present

## 2015-04-14 DIAGNOSIS — T8781 Dehiscence of amputation stump: Secondary | ICD-10-CM | POA: Diagnosis not present

## 2015-04-14 DIAGNOSIS — D509 Iron deficiency anemia, unspecified: Secondary | ICD-10-CM | POA: Diagnosis not present

## 2015-04-14 DIAGNOSIS — D688 Other specified coagulation defects: Secondary | ICD-10-CM | POA: Diagnosis not present

## 2015-04-14 DIAGNOSIS — N186 End stage renal disease: Secondary | ICD-10-CM | POA: Diagnosis not present

## 2015-04-14 DIAGNOSIS — E1151 Type 2 diabetes mellitus with diabetic peripheral angiopathy without gangrene: Secondary | ICD-10-CM | POA: Diagnosis not present

## 2015-04-14 DIAGNOSIS — Z23 Encounter for immunization: Secondary | ICD-10-CM | POA: Diagnosis not present

## 2015-04-14 DIAGNOSIS — D631 Anemia in chronic kidney disease: Secondary | ICD-10-CM | POA: Diagnosis not present

## 2015-04-14 DIAGNOSIS — Z9181 History of falling: Secondary | ICD-10-CM | POA: Diagnosis not present

## 2015-04-14 DIAGNOSIS — E119 Type 2 diabetes mellitus without complications: Secondary | ICD-10-CM | POA: Diagnosis not present

## 2015-04-14 DIAGNOSIS — R52 Pain, unspecified: Secondary | ICD-10-CM | POA: Diagnosis not present

## 2015-04-14 DIAGNOSIS — Z4689 Encounter for fitting and adjustment of other specified devices: Secondary | ICD-10-CM | POA: Diagnosis not present

## 2015-04-14 DIAGNOSIS — Z7982 Long term (current) use of aspirin: Secondary | ICD-10-CM | POA: Diagnosis not present

## 2015-04-14 DIAGNOSIS — F17210 Nicotine dependence, cigarettes, uncomplicated: Secondary | ICD-10-CM | POA: Diagnosis not present

## 2015-04-15 DIAGNOSIS — Z4689 Encounter for fitting and adjustment of other specified devices: Secondary | ICD-10-CM | POA: Diagnosis not present

## 2015-04-15 DIAGNOSIS — F17210 Nicotine dependence, cigarettes, uncomplicated: Secondary | ICD-10-CM | POA: Diagnosis not present

## 2015-04-15 DIAGNOSIS — N186 End stage renal disease: Secondary | ICD-10-CM | POA: Diagnosis not present

## 2015-04-15 DIAGNOSIS — T8781 Dehiscence of amputation stump: Secondary | ICD-10-CM | POA: Diagnosis not present

## 2015-04-15 DIAGNOSIS — Z7982 Long term (current) use of aspirin: Secondary | ICD-10-CM | POA: Diagnosis not present

## 2015-04-15 DIAGNOSIS — Z9181 History of falling: Secondary | ICD-10-CM | POA: Diagnosis not present

## 2015-04-15 DIAGNOSIS — E1151 Type 2 diabetes mellitus with diabetic peripheral angiopathy without gangrene: Secondary | ICD-10-CM | POA: Diagnosis not present

## 2015-04-16 DIAGNOSIS — Z4689 Encounter for fitting and adjustment of other specified devices: Secondary | ICD-10-CM | POA: Diagnosis not present

## 2015-04-16 DIAGNOSIS — Z7982 Long term (current) use of aspirin: Secondary | ICD-10-CM | POA: Diagnosis not present

## 2015-04-16 DIAGNOSIS — Z9181 History of falling: Secondary | ICD-10-CM | POA: Diagnosis not present

## 2015-04-16 DIAGNOSIS — E1151 Type 2 diabetes mellitus with diabetic peripheral angiopathy without gangrene: Secondary | ICD-10-CM | POA: Diagnosis not present

## 2015-04-16 DIAGNOSIS — T8781 Dehiscence of amputation stump: Secondary | ICD-10-CM | POA: Diagnosis not present

## 2015-04-16 DIAGNOSIS — F17210 Nicotine dependence, cigarettes, uncomplicated: Secondary | ICD-10-CM | POA: Diagnosis not present

## 2015-04-16 DIAGNOSIS — N186 End stage renal disease: Secondary | ICD-10-CM | POA: Diagnosis not present

## 2015-04-17 DIAGNOSIS — D631 Anemia in chronic kidney disease: Secondary | ICD-10-CM | POA: Diagnosis not present

## 2015-04-17 DIAGNOSIS — D689 Coagulation defect, unspecified: Secondary | ICD-10-CM | POA: Diagnosis not present

## 2015-04-17 DIAGNOSIS — N186 End stage renal disease: Secondary | ICD-10-CM | POA: Diagnosis not present

## 2015-04-17 DIAGNOSIS — Z23 Encounter for immunization: Secondary | ICD-10-CM | POA: Diagnosis not present

## 2015-04-17 DIAGNOSIS — D688 Other specified coagulation defects: Secondary | ICD-10-CM | POA: Diagnosis not present

## 2015-04-17 DIAGNOSIS — R52 Pain, unspecified: Secondary | ICD-10-CM | POA: Diagnosis not present

## 2015-04-17 DIAGNOSIS — E119 Type 2 diabetes mellitus without complications: Secondary | ICD-10-CM | POA: Diagnosis not present

## 2015-04-17 DIAGNOSIS — D509 Iron deficiency anemia, unspecified: Secondary | ICD-10-CM | POA: Diagnosis not present

## 2015-04-18 DIAGNOSIS — Z7982 Long term (current) use of aspirin: Secondary | ICD-10-CM | POA: Diagnosis not present

## 2015-04-18 DIAGNOSIS — T8781 Dehiscence of amputation stump: Secondary | ICD-10-CM | POA: Diagnosis not present

## 2015-04-18 DIAGNOSIS — Z9181 History of falling: Secondary | ICD-10-CM | POA: Diagnosis not present

## 2015-04-18 DIAGNOSIS — F17210 Nicotine dependence, cigarettes, uncomplicated: Secondary | ICD-10-CM | POA: Diagnosis not present

## 2015-04-18 DIAGNOSIS — E1151 Type 2 diabetes mellitus with diabetic peripheral angiopathy without gangrene: Secondary | ICD-10-CM | POA: Diagnosis not present

## 2015-04-18 DIAGNOSIS — Z4689 Encounter for fitting and adjustment of other specified devices: Secondary | ICD-10-CM | POA: Diagnosis not present

## 2015-04-18 DIAGNOSIS — N186 End stage renal disease: Secondary | ICD-10-CM | POA: Diagnosis not present

## 2015-04-19 DIAGNOSIS — D509 Iron deficiency anemia, unspecified: Secondary | ICD-10-CM | POA: Diagnosis not present

## 2015-04-19 DIAGNOSIS — E1151 Type 2 diabetes mellitus with diabetic peripheral angiopathy without gangrene: Secondary | ICD-10-CM | POA: Diagnosis not present

## 2015-04-19 DIAGNOSIS — Z23 Encounter for immunization: Secondary | ICD-10-CM | POA: Diagnosis not present

## 2015-04-19 DIAGNOSIS — F17210 Nicotine dependence, cigarettes, uncomplicated: Secondary | ICD-10-CM | POA: Diagnosis not present

## 2015-04-19 DIAGNOSIS — Z4689 Encounter for fitting and adjustment of other specified devices: Secondary | ICD-10-CM | POA: Diagnosis not present

## 2015-04-19 DIAGNOSIS — N186 End stage renal disease: Secondary | ICD-10-CM | POA: Diagnosis not present

## 2015-04-19 DIAGNOSIS — R52 Pain, unspecified: Secondary | ICD-10-CM | POA: Diagnosis not present

## 2015-04-19 DIAGNOSIS — Z9181 History of falling: Secondary | ICD-10-CM | POA: Diagnosis not present

## 2015-04-19 DIAGNOSIS — D688 Other specified coagulation defects: Secondary | ICD-10-CM | POA: Diagnosis not present

## 2015-04-19 DIAGNOSIS — T8781 Dehiscence of amputation stump: Secondary | ICD-10-CM | POA: Diagnosis not present

## 2015-04-19 DIAGNOSIS — E119 Type 2 diabetes mellitus without complications: Secondary | ICD-10-CM | POA: Diagnosis not present

## 2015-04-19 DIAGNOSIS — D631 Anemia in chronic kidney disease: Secondary | ICD-10-CM | POA: Diagnosis not present

## 2015-04-19 DIAGNOSIS — D689 Coagulation defect, unspecified: Secondary | ICD-10-CM | POA: Diagnosis not present

## 2015-04-19 DIAGNOSIS — Z7982 Long term (current) use of aspirin: Secondary | ICD-10-CM | POA: Diagnosis not present

## 2015-04-20 DIAGNOSIS — T8781 Dehiscence of amputation stump: Secondary | ICD-10-CM | POA: Diagnosis not present

## 2015-04-20 DIAGNOSIS — N186 End stage renal disease: Secondary | ICD-10-CM | POA: Diagnosis not present

## 2015-04-20 DIAGNOSIS — Z7982 Long term (current) use of aspirin: Secondary | ICD-10-CM | POA: Diagnosis not present

## 2015-04-20 DIAGNOSIS — E1151 Type 2 diabetes mellitus with diabetic peripheral angiopathy without gangrene: Secondary | ICD-10-CM | POA: Diagnosis not present

## 2015-04-20 DIAGNOSIS — F17210 Nicotine dependence, cigarettes, uncomplicated: Secondary | ICD-10-CM | POA: Diagnosis not present

## 2015-04-20 DIAGNOSIS — Z9181 History of falling: Secondary | ICD-10-CM | POA: Diagnosis not present

## 2015-04-20 DIAGNOSIS — Z4689 Encounter for fitting and adjustment of other specified devices: Secondary | ICD-10-CM | POA: Diagnosis not present

## 2015-04-21 DIAGNOSIS — D688 Other specified coagulation defects: Secondary | ICD-10-CM | POA: Diagnosis not present

## 2015-04-21 DIAGNOSIS — E119 Type 2 diabetes mellitus without complications: Secondary | ICD-10-CM | POA: Diagnosis not present

## 2015-04-21 DIAGNOSIS — D509 Iron deficiency anemia, unspecified: Secondary | ICD-10-CM | POA: Diagnosis not present

## 2015-04-21 DIAGNOSIS — D631 Anemia in chronic kidney disease: Secondary | ICD-10-CM | POA: Diagnosis not present

## 2015-04-21 DIAGNOSIS — N186 End stage renal disease: Secondary | ICD-10-CM | POA: Diagnosis not present

## 2015-04-21 DIAGNOSIS — D689 Coagulation defect, unspecified: Secondary | ICD-10-CM | POA: Diagnosis not present

## 2015-04-21 DIAGNOSIS — Z23 Encounter for immunization: Secondary | ICD-10-CM | POA: Diagnosis not present

## 2015-04-21 DIAGNOSIS — R52 Pain, unspecified: Secondary | ICD-10-CM | POA: Diagnosis not present

## 2015-04-23 DIAGNOSIS — N186 End stage renal disease: Secondary | ICD-10-CM | POA: Diagnosis not present

## 2015-04-23 DIAGNOSIS — Z7982 Long term (current) use of aspirin: Secondary | ICD-10-CM | POA: Diagnosis not present

## 2015-04-23 DIAGNOSIS — E1151 Type 2 diabetes mellitus with diabetic peripheral angiopathy without gangrene: Secondary | ICD-10-CM | POA: Diagnosis not present

## 2015-04-23 DIAGNOSIS — T8781 Dehiscence of amputation stump: Secondary | ICD-10-CM | POA: Diagnosis not present

## 2015-04-23 DIAGNOSIS — Z4689 Encounter for fitting and adjustment of other specified devices: Secondary | ICD-10-CM | POA: Diagnosis not present

## 2015-04-23 DIAGNOSIS — Z9181 History of falling: Secondary | ICD-10-CM | POA: Diagnosis not present

## 2015-04-23 DIAGNOSIS — F17210 Nicotine dependence, cigarettes, uncomplicated: Secondary | ICD-10-CM | POA: Diagnosis not present

## 2015-04-24 DIAGNOSIS — D688 Other specified coagulation defects: Secondary | ICD-10-CM | POA: Diagnosis not present

## 2015-04-24 DIAGNOSIS — Z4689 Encounter for fitting and adjustment of other specified devices: Secondary | ICD-10-CM | POA: Diagnosis not present

## 2015-04-24 DIAGNOSIS — D631 Anemia in chronic kidney disease: Secondary | ICD-10-CM | POA: Diagnosis not present

## 2015-04-24 DIAGNOSIS — E119 Type 2 diabetes mellitus without complications: Secondary | ICD-10-CM | POA: Diagnosis not present

## 2015-04-24 DIAGNOSIS — Z7982 Long term (current) use of aspirin: Secondary | ICD-10-CM | POA: Diagnosis not present

## 2015-04-24 DIAGNOSIS — D509 Iron deficiency anemia, unspecified: Secondary | ICD-10-CM | POA: Diagnosis not present

## 2015-04-24 DIAGNOSIS — F17210 Nicotine dependence, cigarettes, uncomplicated: Secondary | ICD-10-CM | POA: Diagnosis not present

## 2015-04-24 DIAGNOSIS — E1151 Type 2 diabetes mellitus with diabetic peripheral angiopathy without gangrene: Secondary | ICD-10-CM | POA: Diagnosis not present

## 2015-04-24 DIAGNOSIS — Z23 Encounter for immunization: Secondary | ICD-10-CM | POA: Diagnosis not present

## 2015-04-24 DIAGNOSIS — D689 Coagulation defect, unspecified: Secondary | ICD-10-CM | POA: Diagnosis not present

## 2015-04-24 DIAGNOSIS — R52 Pain, unspecified: Secondary | ICD-10-CM | POA: Diagnosis not present

## 2015-04-24 DIAGNOSIS — Z9181 History of falling: Secondary | ICD-10-CM | POA: Diagnosis not present

## 2015-04-24 DIAGNOSIS — T8781 Dehiscence of amputation stump: Secondary | ICD-10-CM | POA: Diagnosis not present

## 2015-04-24 DIAGNOSIS — N186 End stage renal disease: Secondary | ICD-10-CM | POA: Diagnosis not present

## 2015-04-25 DIAGNOSIS — N186 End stage renal disease: Secondary | ICD-10-CM | POA: Diagnosis not present

## 2015-04-25 DIAGNOSIS — Z7982 Long term (current) use of aspirin: Secondary | ICD-10-CM | POA: Diagnosis not present

## 2015-04-25 DIAGNOSIS — T8781 Dehiscence of amputation stump: Secondary | ICD-10-CM | POA: Diagnosis not present

## 2015-04-25 DIAGNOSIS — Z9181 History of falling: Secondary | ICD-10-CM | POA: Diagnosis not present

## 2015-04-25 DIAGNOSIS — E1151 Type 2 diabetes mellitus with diabetic peripheral angiopathy without gangrene: Secondary | ICD-10-CM | POA: Diagnosis not present

## 2015-04-25 DIAGNOSIS — F17210 Nicotine dependence, cigarettes, uncomplicated: Secondary | ICD-10-CM | POA: Diagnosis not present

## 2015-04-25 DIAGNOSIS — Z4689 Encounter for fitting and adjustment of other specified devices: Secondary | ICD-10-CM | POA: Diagnosis not present

## 2015-04-26 DIAGNOSIS — D688 Other specified coagulation defects: Secondary | ICD-10-CM | POA: Diagnosis not present

## 2015-04-26 DIAGNOSIS — Z23 Encounter for immunization: Secondary | ICD-10-CM | POA: Diagnosis not present

## 2015-04-26 DIAGNOSIS — Z9181 History of falling: Secondary | ICD-10-CM | POA: Diagnosis not present

## 2015-04-26 DIAGNOSIS — Z4689 Encounter for fitting and adjustment of other specified devices: Secondary | ICD-10-CM | POA: Diagnosis not present

## 2015-04-26 DIAGNOSIS — T8781 Dehiscence of amputation stump: Secondary | ICD-10-CM | POA: Diagnosis not present

## 2015-04-26 DIAGNOSIS — F17210 Nicotine dependence, cigarettes, uncomplicated: Secondary | ICD-10-CM | POA: Diagnosis not present

## 2015-04-26 DIAGNOSIS — Z7982 Long term (current) use of aspirin: Secondary | ICD-10-CM | POA: Diagnosis not present

## 2015-04-26 DIAGNOSIS — D631 Anemia in chronic kidney disease: Secondary | ICD-10-CM | POA: Diagnosis not present

## 2015-04-26 DIAGNOSIS — E119 Type 2 diabetes mellitus without complications: Secondary | ICD-10-CM | POA: Diagnosis not present

## 2015-04-26 DIAGNOSIS — D689 Coagulation defect, unspecified: Secondary | ICD-10-CM | POA: Diagnosis not present

## 2015-04-26 DIAGNOSIS — R52 Pain, unspecified: Secondary | ICD-10-CM | POA: Diagnosis not present

## 2015-04-26 DIAGNOSIS — D509 Iron deficiency anemia, unspecified: Secondary | ICD-10-CM | POA: Diagnosis not present

## 2015-04-26 DIAGNOSIS — N186 End stage renal disease: Secondary | ICD-10-CM | POA: Diagnosis not present

## 2015-04-26 DIAGNOSIS — E1151 Type 2 diabetes mellitus with diabetic peripheral angiopathy without gangrene: Secondary | ICD-10-CM | POA: Diagnosis not present

## 2015-04-27 DIAGNOSIS — N186 End stage renal disease: Secondary | ICD-10-CM | POA: Diagnosis not present

## 2015-04-27 DIAGNOSIS — Z4689 Encounter for fitting and adjustment of other specified devices: Secondary | ICD-10-CM | POA: Diagnosis not present

## 2015-04-27 DIAGNOSIS — Z7982 Long term (current) use of aspirin: Secondary | ICD-10-CM | POA: Diagnosis not present

## 2015-04-27 DIAGNOSIS — E1151 Type 2 diabetes mellitus with diabetic peripheral angiopathy without gangrene: Secondary | ICD-10-CM | POA: Diagnosis not present

## 2015-04-27 DIAGNOSIS — F17210 Nicotine dependence, cigarettes, uncomplicated: Secondary | ICD-10-CM | POA: Diagnosis not present

## 2015-04-27 DIAGNOSIS — T8781 Dehiscence of amputation stump: Secondary | ICD-10-CM | POA: Diagnosis not present

## 2015-04-27 DIAGNOSIS — Z9181 History of falling: Secondary | ICD-10-CM | POA: Diagnosis not present

## 2015-04-28 DIAGNOSIS — E119 Type 2 diabetes mellitus without complications: Secondary | ICD-10-CM | POA: Diagnosis not present

## 2015-04-28 DIAGNOSIS — D509 Iron deficiency anemia, unspecified: Secondary | ICD-10-CM | POA: Diagnosis not present

## 2015-04-28 DIAGNOSIS — Z23 Encounter for immunization: Secondary | ICD-10-CM | POA: Diagnosis not present

## 2015-04-28 DIAGNOSIS — D689 Coagulation defect, unspecified: Secondary | ICD-10-CM | POA: Diagnosis not present

## 2015-04-28 DIAGNOSIS — N186 End stage renal disease: Secondary | ICD-10-CM | POA: Diagnosis not present

## 2015-04-28 DIAGNOSIS — D688 Other specified coagulation defects: Secondary | ICD-10-CM | POA: Diagnosis not present

## 2015-04-28 DIAGNOSIS — D631 Anemia in chronic kidney disease: Secondary | ICD-10-CM | POA: Diagnosis not present

## 2015-04-28 DIAGNOSIS — R52 Pain, unspecified: Secondary | ICD-10-CM | POA: Diagnosis not present

## 2015-04-29 DIAGNOSIS — Z4689 Encounter for fitting and adjustment of other specified devices: Secondary | ICD-10-CM | POA: Diagnosis not present

## 2015-04-29 DIAGNOSIS — F17210 Nicotine dependence, cigarettes, uncomplicated: Secondary | ICD-10-CM | POA: Diagnosis not present

## 2015-04-29 DIAGNOSIS — N186 End stage renal disease: Secondary | ICD-10-CM | POA: Diagnosis not present

## 2015-04-29 DIAGNOSIS — E1151 Type 2 diabetes mellitus with diabetic peripheral angiopathy without gangrene: Secondary | ICD-10-CM | POA: Diagnosis not present

## 2015-04-29 DIAGNOSIS — T8781 Dehiscence of amputation stump: Secondary | ICD-10-CM | POA: Diagnosis not present

## 2015-04-29 DIAGNOSIS — Z9181 History of falling: Secondary | ICD-10-CM | POA: Diagnosis not present

## 2015-04-29 DIAGNOSIS — Z7982 Long term (current) use of aspirin: Secondary | ICD-10-CM | POA: Diagnosis not present

## 2015-05-01 DIAGNOSIS — D509 Iron deficiency anemia, unspecified: Secondary | ICD-10-CM | POA: Diagnosis not present

## 2015-05-01 DIAGNOSIS — D688 Other specified coagulation defects: Secondary | ICD-10-CM | POA: Diagnosis not present

## 2015-05-01 DIAGNOSIS — Z23 Encounter for immunization: Secondary | ICD-10-CM | POA: Diagnosis not present

## 2015-05-01 DIAGNOSIS — E119 Type 2 diabetes mellitus without complications: Secondary | ICD-10-CM | POA: Diagnosis not present

## 2015-05-01 DIAGNOSIS — R52 Pain, unspecified: Secondary | ICD-10-CM | POA: Diagnosis not present

## 2015-05-01 DIAGNOSIS — D631 Anemia in chronic kidney disease: Secondary | ICD-10-CM | POA: Diagnosis not present

## 2015-05-01 DIAGNOSIS — N186 End stage renal disease: Secondary | ICD-10-CM | POA: Diagnosis not present

## 2015-05-01 DIAGNOSIS — D689 Coagulation defect, unspecified: Secondary | ICD-10-CM | POA: Diagnosis not present

## 2015-05-02 DIAGNOSIS — Z89432 Acquired absence of left foot: Secondary | ICD-10-CM | POA: Diagnosis not present

## 2015-05-02 DIAGNOSIS — T8781 Dehiscence of amputation stump: Secondary | ICD-10-CM | POA: Diagnosis not present

## 2015-05-02 DIAGNOSIS — I96 Gangrene, not elsewhere classified: Secondary | ICD-10-CM | POA: Diagnosis not present

## 2015-05-04 DIAGNOSIS — Z4689 Encounter for fitting and adjustment of other specified devices: Secondary | ICD-10-CM | POA: Diagnosis not present

## 2015-05-04 DIAGNOSIS — D689 Coagulation defect, unspecified: Secondary | ICD-10-CM | POA: Diagnosis not present

## 2015-05-04 DIAGNOSIS — Z9181 History of falling: Secondary | ICD-10-CM | POA: Diagnosis not present

## 2015-05-04 DIAGNOSIS — R52 Pain, unspecified: Secondary | ICD-10-CM | POA: Diagnosis not present

## 2015-05-04 DIAGNOSIS — Z7982 Long term (current) use of aspirin: Secondary | ICD-10-CM | POA: Diagnosis not present

## 2015-05-04 DIAGNOSIS — F17210 Nicotine dependence, cigarettes, uncomplicated: Secondary | ICD-10-CM | POA: Diagnosis not present

## 2015-05-04 DIAGNOSIS — N186 End stage renal disease: Secondary | ICD-10-CM | POA: Diagnosis not present

## 2015-05-04 DIAGNOSIS — D631 Anemia in chronic kidney disease: Secondary | ICD-10-CM | POA: Diagnosis not present

## 2015-05-04 DIAGNOSIS — D688 Other specified coagulation defects: Secondary | ICD-10-CM | POA: Diagnosis not present

## 2015-05-04 DIAGNOSIS — E1151 Type 2 diabetes mellitus with diabetic peripheral angiopathy without gangrene: Secondary | ICD-10-CM | POA: Diagnosis not present

## 2015-05-04 DIAGNOSIS — E119 Type 2 diabetes mellitus without complications: Secondary | ICD-10-CM | POA: Diagnosis not present

## 2015-05-04 DIAGNOSIS — D509 Iron deficiency anemia, unspecified: Secondary | ICD-10-CM | POA: Diagnosis not present

## 2015-05-04 DIAGNOSIS — Z23 Encounter for immunization: Secondary | ICD-10-CM | POA: Diagnosis not present

## 2015-05-04 DIAGNOSIS — T8781 Dehiscence of amputation stump: Secondary | ICD-10-CM | POA: Diagnosis not present

## 2015-05-06 DIAGNOSIS — T8189XA Other complications of procedures, not elsewhere classified, initial encounter: Secondary | ICD-10-CM | POA: Diagnosis not present

## 2015-05-06 DIAGNOSIS — R52 Pain, unspecified: Secondary | ICD-10-CM | POA: Diagnosis not present

## 2015-05-06 DIAGNOSIS — E119 Type 2 diabetes mellitus without complications: Secondary | ICD-10-CM | POA: Diagnosis not present

## 2015-05-06 DIAGNOSIS — D689 Coagulation defect, unspecified: Secondary | ICD-10-CM | POA: Diagnosis not present

## 2015-05-06 DIAGNOSIS — D509 Iron deficiency anemia, unspecified: Secondary | ICD-10-CM | POA: Diagnosis not present

## 2015-05-06 DIAGNOSIS — Z23 Encounter for immunization: Secondary | ICD-10-CM | POA: Diagnosis not present

## 2015-05-06 DIAGNOSIS — N186 End stage renal disease: Secondary | ICD-10-CM | POA: Diagnosis not present

## 2015-05-06 DIAGNOSIS — D688 Other specified coagulation defects: Secondary | ICD-10-CM | POA: Diagnosis not present

## 2015-05-06 DIAGNOSIS — D631 Anemia in chronic kidney disease: Secondary | ICD-10-CM | POA: Diagnosis not present

## 2015-05-07 DIAGNOSIS — N186 End stage renal disease: Secondary | ICD-10-CM | POA: Diagnosis not present

## 2015-05-07 DIAGNOSIS — T8781 Dehiscence of amputation stump: Secondary | ICD-10-CM | POA: Diagnosis not present

## 2015-05-07 DIAGNOSIS — E1151 Type 2 diabetes mellitus with diabetic peripheral angiopathy without gangrene: Secondary | ICD-10-CM | POA: Diagnosis not present

## 2015-05-07 DIAGNOSIS — Z9181 History of falling: Secondary | ICD-10-CM | POA: Diagnosis not present

## 2015-05-07 DIAGNOSIS — Z4689 Encounter for fitting and adjustment of other specified devices: Secondary | ICD-10-CM | POA: Diagnosis not present

## 2015-05-07 DIAGNOSIS — Z7982 Long term (current) use of aspirin: Secondary | ICD-10-CM | POA: Diagnosis not present

## 2015-05-07 DIAGNOSIS — F17210 Nicotine dependence, cigarettes, uncomplicated: Secondary | ICD-10-CM | POA: Diagnosis not present

## 2015-05-08 DIAGNOSIS — D509 Iron deficiency anemia, unspecified: Secondary | ICD-10-CM | POA: Diagnosis not present

## 2015-05-08 DIAGNOSIS — N186 End stage renal disease: Secondary | ICD-10-CM | POA: Diagnosis not present

## 2015-05-08 DIAGNOSIS — D689 Coagulation defect, unspecified: Secondary | ICD-10-CM | POA: Diagnosis not present

## 2015-05-08 DIAGNOSIS — R52 Pain, unspecified: Secondary | ICD-10-CM | POA: Diagnosis not present

## 2015-05-08 DIAGNOSIS — Z23 Encounter for immunization: Secondary | ICD-10-CM | POA: Diagnosis not present

## 2015-05-08 DIAGNOSIS — D631 Anemia in chronic kidney disease: Secondary | ICD-10-CM | POA: Diagnosis not present

## 2015-05-08 DIAGNOSIS — D688 Other specified coagulation defects: Secondary | ICD-10-CM | POA: Diagnosis not present

## 2015-05-08 DIAGNOSIS — E119 Type 2 diabetes mellitus without complications: Secondary | ICD-10-CM | POA: Diagnosis not present

## 2015-05-09 ENCOUNTER — Telehealth: Payer: Self-pay | Admitting: Family Medicine

## 2015-05-09 ENCOUNTER — Other Ambulatory Visit: Payer: Self-pay | Admitting: Family Medicine

## 2015-05-09 DIAGNOSIS — Z7982 Long term (current) use of aspirin: Secondary | ICD-10-CM | POA: Diagnosis not present

## 2015-05-09 DIAGNOSIS — F17210 Nicotine dependence, cigarettes, uncomplicated: Secondary | ICD-10-CM | POA: Diagnosis not present

## 2015-05-09 DIAGNOSIS — Z4689 Encounter for fitting and adjustment of other specified devices: Secondary | ICD-10-CM | POA: Diagnosis not present

## 2015-05-09 DIAGNOSIS — Z9181 History of falling: Secondary | ICD-10-CM | POA: Diagnosis not present

## 2015-05-09 DIAGNOSIS — E1129 Type 2 diabetes mellitus with other diabetic kidney complication: Secondary | ICD-10-CM | POA: Diagnosis not present

## 2015-05-09 DIAGNOSIS — N186 End stage renal disease: Secondary | ICD-10-CM | POA: Diagnosis not present

## 2015-05-09 DIAGNOSIS — Z992 Dependence on renal dialysis: Secondary | ICD-10-CM | POA: Diagnosis not present

## 2015-05-09 DIAGNOSIS — E1151 Type 2 diabetes mellitus with diabetic peripheral angiopathy without gangrene: Secondary | ICD-10-CM | POA: Diagnosis not present

## 2015-05-09 DIAGNOSIS — T8781 Dehiscence of amputation stump: Secondary | ICD-10-CM | POA: Diagnosis not present

## 2015-05-09 MED ORDER — OXYCODONE-ACETAMINOPHEN 7.5-325 MG PO TABS: ORAL_TABLET | ORAL | Status: DC

## 2015-05-09 NOTE — Progress Notes (Signed)
Printer problems

## 2015-05-09 NOTE — Telephone Encounter (Signed)
Pt called and needs a refill on his Percocet left up front for pick up. Please call patient when ready. jw

## 2015-05-09 NOTE — Telephone Encounter (Signed)
Pt notified and rx placed up front John [name]

## 2015-05-10 DIAGNOSIS — D688 Other specified coagulation defects: Secondary | ICD-10-CM | POA: Diagnosis not present

## 2015-05-10 DIAGNOSIS — T8189XA Other complications of procedures, not elsewhere classified, initial encounter: Secondary | ICD-10-CM | POA: Diagnosis not present

## 2015-05-10 DIAGNOSIS — R52 Pain, unspecified: Secondary | ICD-10-CM | POA: Diagnosis not present

## 2015-05-10 DIAGNOSIS — D689 Coagulation defect, unspecified: Secondary | ICD-10-CM | POA: Diagnosis not present

## 2015-05-10 DIAGNOSIS — N186 End stage renal disease: Secondary | ICD-10-CM | POA: Diagnosis not present

## 2015-05-10 DIAGNOSIS — D631 Anemia in chronic kidney disease: Secondary | ICD-10-CM | POA: Diagnosis not present

## 2015-05-10 DIAGNOSIS — E119 Type 2 diabetes mellitus without complications: Secondary | ICD-10-CM | POA: Diagnosis not present

## 2015-05-10 DIAGNOSIS — R197 Diarrhea, unspecified: Secondary | ICD-10-CM | POA: Diagnosis not present

## 2015-05-10 DIAGNOSIS — D509 Iron deficiency anemia, unspecified: Secondary | ICD-10-CM | POA: Diagnosis not present

## 2015-05-10 DIAGNOSIS — R51 Headache: Secondary | ICD-10-CM | POA: Diagnosis not present

## 2015-05-11 DIAGNOSIS — F17210 Nicotine dependence, cigarettes, uncomplicated: Secondary | ICD-10-CM | POA: Diagnosis not present

## 2015-05-11 DIAGNOSIS — Z7982 Long term (current) use of aspirin: Secondary | ICD-10-CM | POA: Diagnosis not present

## 2015-05-11 DIAGNOSIS — N186 End stage renal disease: Secondary | ICD-10-CM | POA: Diagnosis not present

## 2015-05-11 DIAGNOSIS — Z4689 Encounter for fitting and adjustment of other specified devices: Secondary | ICD-10-CM | POA: Diagnosis not present

## 2015-05-11 DIAGNOSIS — T8781 Dehiscence of amputation stump: Secondary | ICD-10-CM | POA: Diagnosis not present

## 2015-05-11 DIAGNOSIS — E1151 Type 2 diabetes mellitus with diabetic peripheral angiopathy without gangrene: Secondary | ICD-10-CM | POA: Diagnosis not present

## 2015-05-11 DIAGNOSIS — Z9181 History of falling: Secondary | ICD-10-CM | POA: Diagnosis not present

## 2015-05-12 DIAGNOSIS — D688 Other specified coagulation defects: Secondary | ICD-10-CM | POA: Diagnosis not present

## 2015-05-12 DIAGNOSIS — R52 Pain, unspecified: Secondary | ICD-10-CM | POA: Diagnosis not present

## 2015-05-12 DIAGNOSIS — E119 Type 2 diabetes mellitus without complications: Secondary | ICD-10-CM | POA: Diagnosis not present

## 2015-05-12 DIAGNOSIS — N186 End stage renal disease: Secondary | ICD-10-CM | POA: Diagnosis not present

## 2015-05-12 DIAGNOSIS — D689 Coagulation defect, unspecified: Secondary | ICD-10-CM | POA: Diagnosis not present

## 2015-05-12 DIAGNOSIS — D631 Anemia in chronic kidney disease: Secondary | ICD-10-CM | POA: Diagnosis not present

## 2015-05-12 DIAGNOSIS — R197 Diarrhea, unspecified: Secondary | ICD-10-CM | POA: Diagnosis not present

## 2015-05-12 DIAGNOSIS — R51 Headache: Secondary | ICD-10-CM | POA: Diagnosis not present

## 2015-05-12 DIAGNOSIS — D509 Iron deficiency anemia, unspecified: Secondary | ICD-10-CM | POA: Diagnosis not present

## 2015-05-14 DIAGNOSIS — F17210 Nicotine dependence, cigarettes, uncomplicated: Secondary | ICD-10-CM | POA: Diagnosis not present

## 2015-05-14 DIAGNOSIS — T8781 Dehiscence of amputation stump: Secondary | ICD-10-CM | POA: Diagnosis not present

## 2015-05-14 DIAGNOSIS — Z7982 Long term (current) use of aspirin: Secondary | ICD-10-CM | POA: Diagnosis not present

## 2015-05-14 DIAGNOSIS — E1151 Type 2 diabetes mellitus with diabetic peripheral angiopathy without gangrene: Secondary | ICD-10-CM | POA: Diagnosis not present

## 2015-05-14 DIAGNOSIS — N186 End stage renal disease: Secondary | ICD-10-CM | POA: Diagnosis not present

## 2015-05-14 DIAGNOSIS — Z4689 Encounter for fitting and adjustment of other specified devices: Secondary | ICD-10-CM | POA: Diagnosis not present

## 2015-05-14 DIAGNOSIS — Z9181 History of falling: Secondary | ICD-10-CM | POA: Diagnosis not present

## 2015-05-15 DIAGNOSIS — D688 Other specified coagulation defects: Secondary | ICD-10-CM | POA: Diagnosis not present

## 2015-05-15 DIAGNOSIS — R51 Headache: Secondary | ICD-10-CM | POA: Diagnosis not present

## 2015-05-15 DIAGNOSIS — R52 Pain, unspecified: Secondary | ICD-10-CM | POA: Diagnosis not present

## 2015-05-15 DIAGNOSIS — N186 End stage renal disease: Secondary | ICD-10-CM | POA: Diagnosis not present

## 2015-05-15 DIAGNOSIS — E119 Type 2 diabetes mellitus without complications: Secondary | ICD-10-CM | POA: Diagnosis not present

## 2015-05-15 DIAGNOSIS — D631 Anemia in chronic kidney disease: Secondary | ICD-10-CM | POA: Diagnosis not present

## 2015-05-15 DIAGNOSIS — D689 Coagulation defect, unspecified: Secondary | ICD-10-CM | POA: Diagnosis not present

## 2015-05-15 DIAGNOSIS — D509 Iron deficiency anemia, unspecified: Secondary | ICD-10-CM | POA: Diagnosis not present

## 2015-05-15 DIAGNOSIS — R197 Diarrhea, unspecified: Secondary | ICD-10-CM | POA: Diagnosis not present

## 2015-05-16 ENCOUNTER — Encounter (HOSPITAL_COMMUNITY): Payer: Medicare Other

## 2015-05-16 ENCOUNTER — Ambulatory Visit: Payer: Medicare Other | Admitting: Vascular Surgery

## 2015-05-16 DIAGNOSIS — T8781 Dehiscence of amputation stump: Secondary | ICD-10-CM | POA: Diagnosis not present

## 2015-05-16 DIAGNOSIS — N186 End stage renal disease: Secondary | ICD-10-CM | POA: Diagnosis not present

## 2015-05-16 DIAGNOSIS — Z4689 Encounter for fitting and adjustment of other specified devices: Secondary | ICD-10-CM | POA: Diagnosis not present

## 2015-05-16 DIAGNOSIS — F17210 Nicotine dependence, cigarettes, uncomplicated: Secondary | ICD-10-CM | POA: Diagnosis not present

## 2015-05-16 DIAGNOSIS — Z7982 Long term (current) use of aspirin: Secondary | ICD-10-CM | POA: Diagnosis not present

## 2015-05-16 DIAGNOSIS — Z9181 History of falling: Secondary | ICD-10-CM | POA: Diagnosis not present

## 2015-05-16 DIAGNOSIS — E1151 Type 2 diabetes mellitus with diabetic peripheral angiopathy without gangrene: Secondary | ICD-10-CM | POA: Diagnosis not present

## 2015-05-17 DIAGNOSIS — R52 Pain, unspecified: Secondary | ICD-10-CM | POA: Diagnosis not present

## 2015-05-17 DIAGNOSIS — D509 Iron deficiency anemia, unspecified: Secondary | ICD-10-CM | POA: Diagnosis not present

## 2015-05-17 DIAGNOSIS — R197 Diarrhea, unspecified: Secondary | ICD-10-CM | POA: Diagnosis not present

## 2015-05-17 DIAGNOSIS — D631 Anemia in chronic kidney disease: Secondary | ICD-10-CM | POA: Diagnosis not present

## 2015-05-17 DIAGNOSIS — E119 Type 2 diabetes mellitus without complications: Secondary | ICD-10-CM | POA: Diagnosis not present

## 2015-05-17 DIAGNOSIS — D689 Coagulation defect, unspecified: Secondary | ICD-10-CM | POA: Diagnosis not present

## 2015-05-17 DIAGNOSIS — D688 Other specified coagulation defects: Secondary | ICD-10-CM | POA: Diagnosis not present

## 2015-05-17 DIAGNOSIS — R51 Headache: Secondary | ICD-10-CM | POA: Diagnosis not present

## 2015-05-17 DIAGNOSIS — N186 End stage renal disease: Secondary | ICD-10-CM | POA: Diagnosis not present

## 2015-05-18 DIAGNOSIS — F17210 Nicotine dependence, cigarettes, uncomplicated: Secondary | ICD-10-CM | POA: Diagnosis not present

## 2015-05-18 DIAGNOSIS — E1151 Type 2 diabetes mellitus with diabetic peripheral angiopathy without gangrene: Secondary | ICD-10-CM | POA: Diagnosis not present

## 2015-05-18 DIAGNOSIS — Z4689 Encounter for fitting and adjustment of other specified devices: Secondary | ICD-10-CM | POA: Diagnosis not present

## 2015-05-18 DIAGNOSIS — Z9181 History of falling: Secondary | ICD-10-CM | POA: Diagnosis not present

## 2015-05-18 DIAGNOSIS — N186 End stage renal disease: Secondary | ICD-10-CM | POA: Diagnosis not present

## 2015-05-18 DIAGNOSIS — Z7982 Long term (current) use of aspirin: Secondary | ICD-10-CM | POA: Diagnosis not present

## 2015-05-18 DIAGNOSIS — T8781 Dehiscence of amputation stump: Secondary | ICD-10-CM | POA: Diagnosis not present

## 2015-05-19 DIAGNOSIS — R197 Diarrhea, unspecified: Secondary | ICD-10-CM | POA: Diagnosis not present

## 2015-05-19 DIAGNOSIS — D689 Coagulation defect, unspecified: Secondary | ICD-10-CM | POA: Diagnosis not present

## 2015-05-19 DIAGNOSIS — D631 Anemia in chronic kidney disease: Secondary | ICD-10-CM | POA: Diagnosis not present

## 2015-05-19 DIAGNOSIS — E119 Type 2 diabetes mellitus without complications: Secondary | ICD-10-CM | POA: Diagnosis not present

## 2015-05-19 DIAGNOSIS — R51 Headache: Secondary | ICD-10-CM | POA: Diagnosis not present

## 2015-05-19 DIAGNOSIS — D509 Iron deficiency anemia, unspecified: Secondary | ICD-10-CM | POA: Diagnosis not present

## 2015-05-19 DIAGNOSIS — N186 End stage renal disease: Secondary | ICD-10-CM | POA: Diagnosis not present

## 2015-05-19 DIAGNOSIS — R52 Pain, unspecified: Secondary | ICD-10-CM | POA: Diagnosis not present

## 2015-05-19 DIAGNOSIS — D688 Other specified coagulation defects: Secondary | ICD-10-CM | POA: Diagnosis not present

## 2015-05-21 DIAGNOSIS — Z4689 Encounter for fitting and adjustment of other specified devices: Secondary | ICD-10-CM | POA: Diagnosis not present

## 2015-05-21 DIAGNOSIS — T8781 Dehiscence of amputation stump: Secondary | ICD-10-CM | POA: Diagnosis not present

## 2015-05-21 DIAGNOSIS — F17210 Nicotine dependence, cigarettes, uncomplicated: Secondary | ICD-10-CM | POA: Diagnosis not present

## 2015-05-21 DIAGNOSIS — N186 End stage renal disease: Secondary | ICD-10-CM | POA: Diagnosis not present

## 2015-05-21 DIAGNOSIS — E1151 Type 2 diabetes mellitus with diabetic peripheral angiopathy without gangrene: Secondary | ICD-10-CM | POA: Diagnosis not present

## 2015-05-21 DIAGNOSIS — Z9181 History of falling: Secondary | ICD-10-CM | POA: Diagnosis not present

## 2015-05-21 DIAGNOSIS — Z7982 Long term (current) use of aspirin: Secondary | ICD-10-CM | POA: Diagnosis not present

## 2015-05-22 DIAGNOSIS — D688 Other specified coagulation defects: Secondary | ICD-10-CM | POA: Diagnosis not present

## 2015-05-22 DIAGNOSIS — N186 End stage renal disease: Secondary | ICD-10-CM | POA: Diagnosis not present

## 2015-05-22 DIAGNOSIS — R52 Pain, unspecified: Secondary | ICD-10-CM | POA: Diagnosis not present

## 2015-05-22 DIAGNOSIS — R51 Headache: Secondary | ICD-10-CM | POA: Diagnosis not present

## 2015-05-22 DIAGNOSIS — E119 Type 2 diabetes mellitus without complications: Secondary | ICD-10-CM | POA: Diagnosis not present

## 2015-05-22 DIAGNOSIS — D631 Anemia in chronic kidney disease: Secondary | ICD-10-CM | POA: Diagnosis not present

## 2015-05-22 DIAGNOSIS — D509 Iron deficiency anemia, unspecified: Secondary | ICD-10-CM | POA: Diagnosis not present

## 2015-05-22 DIAGNOSIS — D689 Coagulation defect, unspecified: Secondary | ICD-10-CM | POA: Diagnosis not present

## 2015-05-22 DIAGNOSIS — R197 Diarrhea, unspecified: Secondary | ICD-10-CM | POA: Diagnosis not present

## 2015-05-23 ENCOUNTER — Ambulatory Visit: Payer: Medicare Other | Admitting: Family Medicine

## 2015-05-23 DIAGNOSIS — E1151 Type 2 diabetes mellitus with diabetic peripheral angiopathy without gangrene: Secondary | ICD-10-CM | POA: Diagnosis not present

## 2015-05-23 DIAGNOSIS — N186 End stage renal disease: Secondary | ICD-10-CM | POA: Diagnosis not present

## 2015-05-23 DIAGNOSIS — I96 Gangrene, not elsewhere classified: Secondary | ICD-10-CM | POA: Diagnosis not present

## 2015-05-23 DIAGNOSIS — Z7982 Long term (current) use of aspirin: Secondary | ICD-10-CM | POA: Diagnosis not present

## 2015-05-23 DIAGNOSIS — T8781 Dehiscence of amputation stump: Secondary | ICD-10-CM | POA: Diagnosis not present

## 2015-05-23 DIAGNOSIS — Z9181 History of falling: Secondary | ICD-10-CM | POA: Diagnosis not present

## 2015-05-23 DIAGNOSIS — Z4689 Encounter for fitting and adjustment of other specified devices: Secondary | ICD-10-CM | POA: Diagnosis not present

## 2015-05-23 DIAGNOSIS — F17210 Nicotine dependence, cigarettes, uncomplicated: Secondary | ICD-10-CM | POA: Diagnosis not present

## 2015-05-23 DIAGNOSIS — Z89432 Acquired absence of left foot: Secondary | ICD-10-CM | POA: Diagnosis not present

## 2015-05-24 DIAGNOSIS — N186 End stage renal disease: Secondary | ICD-10-CM | POA: Diagnosis not present

## 2015-05-24 DIAGNOSIS — R51 Headache: Secondary | ICD-10-CM | POA: Diagnosis not present

## 2015-05-24 DIAGNOSIS — R197 Diarrhea, unspecified: Secondary | ICD-10-CM | POA: Diagnosis not present

## 2015-05-24 DIAGNOSIS — D509 Iron deficiency anemia, unspecified: Secondary | ICD-10-CM | POA: Diagnosis not present

## 2015-05-24 DIAGNOSIS — R52 Pain, unspecified: Secondary | ICD-10-CM | POA: Diagnosis not present

## 2015-05-24 DIAGNOSIS — D631 Anemia in chronic kidney disease: Secondary | ICD-10-CM | POA: Diagnosis not present

## 2015-05-24 DIAGNOSIS — D688 Other specified coagulation defects: Secondary | ICD-10-CM | POA: Diagnosis not present

## 2015-05-24 DIAGNOSIS — D689 Coagulation defect, unspecified: Secondary | ICD-10-CM | POA: Diagnosis not present

## 2015-05-24 DIAGNOSIS — E119 Type 2 diabetes mellitus without complications: Secondary | ICD-10-CM | POA: Diagnosis not present

## 2015-05-25 DIAGNOSIS — T8781 Dehiscence of amputation stump: Secondary | ICD-10-CM | POA: Diagnosis not present

## 2015-05-25 DIAGNOSIS — Z7982 Long term (current) use of aspirin: Secondary | ICD-10-CM | POA: Diagnosis not present

## 2015-05-25 DIAGNOSIS — E1151 Type 2 diabetes mellitus with diabetic peripheral angiopathy without gangrene: Secondary | ICD-10-CM | POA: Diagnosis not present

## 2015-05-25 DIAGNOSIS — F17210 Nicotine dependence, cigarettes, uncomplicated: Secondary | ICD-10-CM | POA: Diagnosis not present

## 2015-05-25 DIAGNOSIS — Z9181 History of falling: Secondary | ICD-10-CM | POA: Diagnosis not present

## 2015-05-25 DIAGNOSIS — N186 End stage renal disease: Secondary | ICD-10-CM | POA: Diagnosis not present

## 2015-05-25 DIAGNOSIS — Z4689 Encounter for fitting and adjustment of other specified devices: Secondary | ICD-10-CM | POA: Diagnosis not present

## 2015-05-26 DIAGNOSIS — R197 Diarrhea, unspecified: Secondary | ICD-10-CM | POA: Diagnosis not present

## 2015-05-26 DIAGNOSIS — D631 Anemia in chronic kidney disease: Secondary | ICD-10-CM | POA: Diagnosis not present

## 2015-05-26 DIAGNOSIS — E119 Type 2 diabetes mellitus without complications: Secondary | ICD-10-CM | POA: Diagnosis not present

## 2015-05-26 DIAGNOSIS — R51 Headache: Secondary | ICD-10-CM | POA: Diagnosis not present

## 2015-05-26 DIAGNOSIS — D509 Iron deficiency anemia, unspecified: Secondary | ICD-10-CM | POA: Diagnosis not present

## 2015-05-26 DIAGNOSIS — R52 Pain, unspecified: Secondary | ICD-10-CM | POA: Diagnosis not present

## 2015-05-26 DIAGNOSIS — N186 End stage renal disease: Secondary | ICD-10-CM | POA: Diagnosis not present

## 2015-05-26 DIAGNOSIS — D688 Other specified coagulation defects: Secondary | ICD-10-CM | POA: Diagnosis not present

## 2015-05-26 DIAGNOSIS — D689 Coagulation defect, unspecified: Secondary | ICD-10-CM | POA: Diagnosis not present

## 2015-05-28 ENCOUNTER — Encounter: Payer: Medicare Other | Attending: Surgery | Admitting: Surgery

## 2015-05-28 DIAGNOSIS — M86372 Chronic multifocal osteomyelitis, left ankle and foot: Secondary | ICD-10-CM | POA: Insufficient documentation

## 2015-05-28 DIAGNOSIS — Z9181 History of falling: Secondary | ICD-10-CM | POA: Diagnosis not present

## 2015-05-28 DIAGNOSIS — Z992 Dependence on renal dialysis: Secondary | ICD-10-CM | POA: Insufficient documentation

## 2015-05-28 DIAGNOSIS — I129 Hypertensive chronic kidney disease with stage 1 through stage 4 chronic kidney disease, or unspecified chronic kidney disease: Secondary | ICD-10-CM | POA: Insufficient documentation

## 2015-05-28 DIAGNOSIS — F17200 Nicotine dependence, unspecified, uncomplicated: Secondary | ICD-10-CM | POA: Insufficient documentation

## 2015-05-28 DIAGNOSIS — J45909 Unspecified asthma, uncomplicated: Secondary | ICD-10-CM | POA: Insufficient documentation

## 2015-05-28 DIAGNOSIS — T8754 Necrosis of amputation stump, left lower extremity: Secondary | ICD-10-CM | POA: Diagnosis not present

## 2015-05-28 DIAGNOSIS — E1151 Type 2 diabetes mellitus with diabetic peripheral angiopathy without gangrene: Secondary | ICD-10-CM | POA: Diagnosis not present

## 2015-05-28 DIAGNOSIS — E11621 Type 2 diabetes mellitus with foot ulcer: Secondary | ICD-10-CM | POA: Insufficient documentation

## 2015-05-28 DIAGNOSIS — T8781 Dehiscence of amputation stump: Secondary | ICD-10-CM | POA: Diagnosis not present

## 2015-05-28 DIAGNOSIS — N183 Chronic kidney disease, stage 3 (moderate): Secondary | ICD-10-CM | POA: Diagnosis not present

## 2015-05-28 DIAGNOSIS — I70245 Atherosclerosis of native arteries of left leg with ulceration of other part of foot: Secondary | ICD-10-CM | POA: Insufficient documentation

## 2015-05-28 DIAGNOSIS — X58XXXA Exposure to other specified factors, initial encounter: Secondary | ICD-10-CM | POA: Diagnosis not present

## 2015-05-28 DIAGNOSIS — G4730 Sleep apnea, unspecified: Secondary | ICD-10-CM | POA: Diagnosis not present

## 2015-05-28 DIAGNOSIS — N186 End stage renal disease: Secondary | ICD-10-CM | POA: Diagnosis not present

## 2015-05-28 DIAGNOSIS — T8189XA Other complications of procedures, not elsewhere classified, initial encounter: Secondary | ICD-10-CM | POA: Insufficient documentation

## 2015-05-28 DIAGNOSIS — S91302A Unspecified open wound, left foot, initial encounter: Secondary | ICD-10-CM | POA: Diagnosis not present

## 2015-05-28 DIAGNOSIS — Z4689 Encounter for fitting and adjustment of other specified devices: Secondary | ICD-10-CM | POA: Diagnosis not present

## 2015-05-28 DIAGNOSIS — Z7982 Long term (current) use of aspirin: Secondary | ICD-10-CM | POA: Diagnosis not present

## 2015-05-28 DIAGNOSIS — J449 Chronic obstructive pulmonary disease, unspecified: Secondary | ICD-10-CM | POA: Diagnosis not present

## 2015-05-28 DIAGNOSIS — F17210 Nicotine dependence, cigarettes, uncomplicated: Secondary | ICD-10-CM | POA: Diagnosis not present

## 2015-05-28 DIAGNOSIS — Z794 Long term (current) use of insulin: Secondary | ICD-10-CM | POA: Diagnosis not present

## 2015-05-29 DIAGNOSIS — T8781 Dehiscence of amputation stump: Secondary | ICD-10-CM | POA: Diagnosis not present

## 2015-05-29 DIAGNOSIS — F17210 Nicotine dependence, cigarettes, uncomplicated: Secondary | ICD-10-CM | POA: Diagnosis not present

## 2015-05-29 DIAGNOSIS — Z7982 Long term (current) use of aspirin: Secondary | ICD-10-CM | POA: Diagnosis not present

## 2015-05-29 DIAGNOSIS — D509 Iron deficiency anemia, unspecified: Secondary | ICD-10-CM | POA: Diagnosis not present

## 2015-05-29 DIAGNOSIS — R51 Headache: Secondary | ICD-10-CM | POA: Diagnosis not present

## 2015-05-29 DIAGNOSIS — R52 Pain, unspecified: Secondary | ICD-10-CM | POA: Diagnosis not present

## 2015-05-29 DIAGNOSIS — Z4689 Encounter for fitting and adjustment of other specified devices: Secondary | ICD-10-CM | POA: Diagnosis not present

## 2015-05-29 DIAGNOSIS — N186 End stage renal disease: Secondary | ICD-10-CM | POA: Diagnosis not present

## 2015-05-29 DIAGNOSIS — D688 Other specified coagulation defects: Secondary | ICD-10-CM | POA: Diagnosis not present

## 2015-05-29 DIAGNOSIS — E119 Type 2 diabetes mellitus without complications: Secondary | ICD-10-CM | POA: Diagnosis not present

## 2015-05-29 DIAGNOSIS — R197 Diarrhea, unspecified: Secondary | ICD-10-CM | POA: Diagnosis not present

## 2015-05-29 DIAGNOSIS — D631 Anemia in chronic kidney disease: Secondary | ICD-10-CM | POA: Diagnosis not present

## 2015-05-29 DIAGNOSIS — E1151 Type 2 diabetes mellitus with diabetic peripheral angiopathy without gangrene: Secondary | ICD-10-CM | POA: Diagnosis not present

## 2015-05-29 DIAGNOSIS — Z9181 History of falling: Secondary | ICD-10-CM | POA: Diagnosis not present

## 2015-05-29 DIAGNOSIS — D689 Coagulation defect, unspecified: Secondary | ICD-10-CM | POA: Diagnosis not present

## 2015-05-30 DIAGNOSIS — I739 Peripheral vascular disease, unspecified: Secondary | ICD-10-CM | POA: Diagnosis not present

## 2015-05-30 DIAGNOSIS — I998 Other disorder of circulatory system: Secondary | ICD-10-CM | POA: Diagnosis not present

## 2015-05-30 DIAGNOSIS — I10 Essential (primary) hypertension: Secondary | ICD-10-CM | POA: Diagnosis not present

## 2015-05-30 DIAGNOSIS — I639 Cerebral infarction, unspecified: Secondary | ICD-10-CM | POA: Diagnosis not present

## 2015-05-30 DIAGNOSIS — E118 Type 2 diabetes mellitus with unspecified complications: Secondary | ICD-10-CM | POA: Diagnosis not present

## 2015-05-31 DIAGNOSIS — D631 Anemia in chronic kidney disease: Secondary | ICD-10-CM | POA: Diagnosis not present

## 2015-05-31 DIAGNOSIS — D688 Other specified coagulation defects: Secondary | ICD-10-CM | POA: Diagnosis not present

## 2015-05-31 DIAGNOSIS — D509 Iron deficiency anemia, unspecified: Secondary | ICD-10-CM | POA: Diagnosis not present

## 2015-05-31 DIAGNOSIS — R51 Headache: Secondary | ICD-10-CM | POA: Diagnosis not present

## 2015-05-31 DIAGNOSIS — D689 Coagulation defect, unspecified: Secondary | ICD-10-CM | POA: Diagnosis not present

## 2015-05-31 DIAGNOSIS — R197 Diarrhea, unspecified: Secondary | ICD-10-CM | POA: Diagnosis not present

## 2015-05-31 DIAGNOSIS — E119 Type 2 diabetes mellitus without complications: Secondary | ICD-10-CM | POA: Diagnosis not present

## 2015-05-31 DIAGNOSIS — N186 End stage renal disease: Secondary | ICD-10-CM | POA: Diagnosis not present

## 2015-05-31 DIAGNOSIS — R52 Pain, unspecified: Secondary | ICD-10-CM | POA: Diagnosis not present

## 2015-05-31 NOTE — Progress Notes (Signed)
John [name], John [name] ([id number]) Visit Report for 05/28/2015 Abuse/Suicide Risk Screen Details Patient Name: John [name], John [name]. Date of Service: 05/28/2015 1:30 PM Medical Record Number: [id number] Patient Account Number: [id number] Date of Birth/Sex: Oct 26, 1955 (59 y.o. Male) Treating RN: Carolyne Fiscal, Debi Primary Care Physician: [name] Other Clinician: Referring Physician: Treating Physician/Extender: Frann Rider in Treatment: 0 Abuse/Suicide Risk Screen Items Answer ABUSE/SUICIDE RISK SCREEN: Has anyone close to you tried to hurt or harm you recentlyo No Do you feel uncomfortable with anyone in your familyo No Has anyone forced you do things that you didnot want to doo No Do you have any thoughts of harming yourselfo No Patient displays signs or symptoms of abuse and/or neglect. No Electronic Signature(s) Signed: 05/30/2015 4:19:08 PM By: Alric Quan Entered By: Alric Quan on 05/28/2015 14:24:57 John [name] ([id number]) -------------------------------------------------------------------------------- Activities of Daily Living Details Patient Name: John [name]. Date of Service: 05/28/2015 1:30 PM Medical Record Number: [id number] Patient Account Number: [id number] Date of Birth/Sex: September 12, 1955 (59 y.o. Male) Treating RN: Ahmed Prima Primary Care Physician: [name] Other Clinician: Referring Physician: Treating Physician/Extender: Frann Rider in Treatment: 0 Activities of Daily Living Items Answer Activities of Daily Living (Please select one for each item) Drive Automobile Completely Able Take Medications Completely Able Use Telephone Completely Able Care for Appearance Completely Able Use Toilet Completely Able Bath / Shower Need Assistance Dress Self Completely Able Feed Self Completely Able Walk Need Assistance Get In / Out Bed Completely Able Housework Need Assistance Prepare Meals Completely [place] for Self Completely Able Electronic Signature(s) Signed: 05/30/2015 4:19:08 PM By: Alric Quan Entered By: Alric Quan on 05/28/2015 14:26:20 John [name] ([id number]) -------------------------------------------------------------------------------- Education Assessment Details Patient Name: John [name]. Date of Service: 05/28/2015 1:30 PM Medical Record Number: [id number] Patient Account Number: [id number] Date of Birth/Sex: 02-Sep-1955 (59 y.o. Male) Treating RN: Carolyne Fiscal, Debi Primary Care Physician: [name] Other Clinician: Referring Physician: Treating Physician/Extender: Frann Rider in Treatment: 0 Primary Learner Assessed: Patient Learning Preferences/Education Level/Primary Language Learning Preference: Explanation, Printed Material Highest Education Level: High School Preferred Language: English Cognitive Barrier Assessment/Beliefs Language Barrier: No Translator Needed: No Memory Deficit: No Emotional Barrier: No Cultural/Religious Beliefs Affecting Medical No Care: Physical Barrier Assessment Impaired Vision: Yes Glasses Impaired Hearing: No Decreased Hand dexterity: No Knowledge/Comprehension Assessment Knowledge Level: High Comprehension Level: High Ability to understand written High instructions: Ability to understand verbal High instructions: Motivation Assessment Anxiety Level: Calm Cooperation: Cooperative Education Importance: Acknowledges Need Interest in Health Problems: Asks Questions Perception: Coherent Willingness to Engage in Self- High Management Activities: Readiness to Engage in Self- High Management Activities: Electronic Signature(s) John [name], John [name] ([id number]) Signed: 05/30/2015 4:19:08 PM By: Alric Quan Entered By: Alric Quan on 05/28/2015 14:27:04 John [name] ([id number]) -------------------------------------------------------------------------------- Fall Risk Assessment  Details Patient Name: John [name]. Date of Service: 05/28/2015 1:30 PM Medical Record Number: [id number] Patient Account Number: [id number] Date of Birth/Sex: 08/21/1955 (59 y.o. Male) Treating RN: Carolyne Fiscal, Debi Primary Care Physician: [name] Other Clinician: Referring Physician: Treating Physician/Extender: Frann Rider in Treatment: 0 Fall Risk Assessment Items Have you had 2 or more falls in the last 12 monthso 0 No Have you had any fall that resulted in injury in the last 12 monthso 0 No FALL RISK ASSESSMENT: History of falling - immediate or within 3 months 0 No Secondary diagnosis 15 Yes Ambulatory aid None/bed rest/wheelchair/nurse 0 Yes Crutches/cane/walker 15 Yes Furniture 0 No IV Access/Saline Lock 0 No  Gait/Training Normal/bed rest/immobile 0 No Weak 0 No Impaired 20 Yes Mental Status Oriented to own ability 0 Yes Electronic Signature(s) Signed: 05/30/2015 4:19:08 PM By: Alric Quan Entered By: Alric Quan on 05/28/2015 14:28:21 John [name] ([id number]) -------------------------------------------------------------------------------- Foot Assessment Details Patient Name: John [name]. Date of Service: 05/28/2015 1:30 PM Medical Record Number: [id number] Patient Account Number: [id number] Date of Birth/Sex: Sep 14, 1955 (59 y.o. Male) Treating RN: Carolyne Fiscal, Debi Primary Care Physician: [name] Other Clinician: Referring Physician: Treating Physician/Extender: Frann Rider in Treatment: 0 Foot Assessment Items [x]  Unable to perform left foot assessment due to amputation Site Locations + = Sensation present, - = Sensation absent, C = Callus, U = Ulcer R = Redness, W = Warmth, M = Maceration, PU = Pre-ulcerative lesion F = Fissure, S = Swelling, D = Dryness Assessment Right: Left: Other Deformity: No Prior Foot Ulcer: No Prior Amputation: No Charcot Joint: No Ambulatory Status: Ambulatory With Help Assistance Device:  Wheelchair Gait: Buyer, retail Signature(s) Signed: 05/30/2015 4:19:08 PM By: Alric Quan Entered By: Alric Quan on 05/28/2015 14:29:34 John [name] ([id number] [name], John [name] ([id number]) -------------------------------------------------------------------------------- Nutrition Risk Assessment Details Patient Name: John [name]. Date of Service: 05/28/2015 1:30 PM Medical Record Number: [id number] Patient Account Number: [id number] Date of Birth/Sex: 05-27-1956 (59 y.o. Male) Treating RN: Carolyne Fiscal, Debi Primary Care Physician: [name] Other Clinician: Referring Physician: Treating Physician/Extender: Frann Rider in Treatment: 0 Height (in): Weight (lbs): Body Mass Index (BMI): Nutrition Risk Assessment Items NUTRITION RISK SCREEN: I have an illness or condition that made me change the kind and/or 2 Yes amount of food I eat I eat fewer than two meals per day 0 No I eat few fruits and vegetables, or milk products 0 No I have three or more drinks of beer, liquor or wine almost every day 0 No I have tooth or mouth problems that make it hard for me to eat 0 No I don't always have enough money to buy the food I need 0 No I eat alone most of the time 0 No I take three or more different prescribed or over-the-counter drugs a 1 Yes day Without wanting to, I have lost or gained 10 pounds in the last six 2 Yes months I am not always physically able to shop, cook and/or feed myself 0 No Nutrition Protocols Good Risk Protocol Moderate Risk Protocol Electronic Signature(s) Signed: 05/30/2015 4:19:08 PM By: Alric Quan Entered By: Alric Quan on 05/28/2015 14:28:58

## 2015-05-31 NOTE — Progress Notes (Signed)
DEJA, ITURRALDE ([id number]) Visit Report for 05/28/2015 Allergy List Details Patient Name: DAVIEN, DRAGONETTI. Date of Service: 05/28/2015 1:30 PM Medical Record Number: [id number] Patient Account Number: [id number] Date of Birth/Sex: [birth year] (59 y.o. Male) Treating RN: Ahmed Prima Primary Care Physician: [name] Other Clinician: Referring Physician: Treating Physician/Extender: Frann Rider in Treatment: 0 Allergies Active Allergies Nicoderm patch Reaction: hives Severity: Severe Allergy Notes Electronic Signature(s) Signed: 05/30/2015 4:19:08 PM By: Alric Quan Entered By: Alric Quan on 05/28/2015 14:14:29 Elly Modena ([id number]) -------------------------------------------------------------------------------- Arrival Information Details Patient Name: Elly Modena. Date of Service: 05/28/2015 1:30 PM Medical Record Number: [id number] Patient Account Number: [id number] Date of Birth/Sex: 03-25-1956 (59 y.o. Male) Treating RN: Carolyne Fiscal, Debi Primary Care Physician: [name] Other Clinician: Referring Physician: Treating Physician/Extender: Frann Rider in Treatment: 0 Visit Information Patient Arrived: Wheel Chair Arrival Time: 13:49 Accompanied By: niece Transfer Assistance: EasyPivot Patient Lift Patient Identification Verified: Yes Secondary Verification Process Yes Completed: Patient Requires Transmission- No Based Precautions: Patient Has Alerts: Yes Patient Alerts: Patient on Blood Thinner plavix aspirin DM II Electronic Signature(s) Signed: 05/30/2015 4:19:08 PM By: Alric Quan Entered By: Alric Quan on 05/28/2015 13:50:42 Elly Modena ([id number]) -------------------------------------------------------------------------------- Clinic Level of Care Assessment Details Patient Name: Elly Modena. Date of Service: 05/28/2015 1:30 PM Medical Record Number: [id number] Patient Account Number:  [id number] Date of Birth/Sex: 22-Dec-1955 (59 y.o. Male) Treating RN: Carolyne Fiscal, Debi Primary Care Physician: [name] Other Clinician: Referring Physician: Treating Physician/Extender: Frann Rider in Treatment: 0 Clinic Level of Care Assessment Items TOOL 2 Quantity Score []  - Use when only an EandM is performed on the INITIAL visit 0 ASSESSMENTS - Nursing Assessment / Reassessment []  - General Physical Exam (combine w/ comprehensive assessment (listed just 0 below) when performed on new pt. evals) X - Comprehensive Assessment (HX, ROS, Risk Assessments, Wounds Hx, etc.) 1 25 ASSESSMENTS - Wound and Skin Assessment / Reassessment []  - Simple Wound Assessment / Reassessment - one wound 0 X - Complex Wound Assessment / Reassessment - multiple wounds 1 5 []  - Dermatologic / Skin Assessment (not related to wound area) 0 ASSESSMENTS - Ostomy and/or Continence Assessment and Care []  - Incontinence Assessment and Management 0 []  - Ostomy Care Assessment and Management (repouching, etc.) 0 PROCESS - Coordination of Care []  - Simple Patient / Family Education for ongoing care 0 X - Complex (extensive) Patient / Family Education for ongoing care 1 20 []  - Staff obtains Programmer, systems, Records, Test Results / Process Orders 0 []  - Staff telephones HHA, Nursing Homes / Clarify orders / etc 0 []  - Routine Transfer to another Facility (non-emergent condition) 0 []  - Routine Hospital Admission (non-emergent condition) 0 []  - New Admissions / Biomedical engineer / Ordering NPWT, Apligraf, etc. 0 []  - Emergency Hospital Admission (emergent condition) 0 X - Simple Discharge Coordination 1 10 RASHEED, KARGES. ([id number]) []  - Complex (extensive) Discharge Coordination 0 PROCESS - Special Needs []  - Pediatric / Minor Patient Management 0 []  - Isolation Patient Management 0 []  - Hearing / Language / Visual special needs 0 []  - Assessment of Community assistance (transportation, D/C planning,  etc.) 0 []  - Additional assistance / Altered mentation 0 []  - Support Surface(s) Assessment (bed, cushion, seat, etc.) 0 INTERVENTIONS - Wound Cleansing / Measurement X - Wound Imaging (photographs - any number of wounds) 1 5 []  - Wound Tracing (instead of photographs) 0 X - Simple Wound Measurement - one wound 1 5 []  - Complex Wound  Measurement - multiple wounds 0 X - Simple Wound Cleansing - one wound 1 5 []  - Complex Wound Cleansing - multiple wounds 0 INTERVENTIONS - Wound Dressings []  - Small Wound Dressing one or multiple wounds 0 X - Medium Wound Dressing one or multiple wounds 1 15 []  - Large Wound Dressing one or multiple wounds 0 []  - Application of Medications - injection 0 INTERVENTIONS - Miscellaneous []  - External ear exam 0 []  - Specimen Collection (cultures, biopsies, blood, body fluids, etc.) 0 []  - Specimen(s) / Culture(s) sent or taken to Lab for analysis 0 []  - Patient Transfer (multiple staff / Harrel Lemon Lift / Similar devices) 0 []  - Simple Staple / Suture removal (25 or less) 0 []  - Complex Staple / Suture removal (26 or more) 0 KIAEEM, GOOLD. ([id number]) []  - Hypo / Hyperglycemic Management (close monitor of Blood Glucose) 0 []  - Ankle / Brachial Index (ABI) - do not check if billed separately 0 Has the patient been seen at the hospital within the last three years: Yes Total Score: 90 Level Of Care: New/Established - Level 3 Electronic Signature(s) Signed: 05/30/2015 4:19:08 PM By: Alric Quan Entered By: Alric Quan on 05/28/2015 17:32:00 Elly Modena ([id number]) -------------------------------------------------------------------------------- Encounter Discharge Information Details Patient Name: Elly Modena. Date of Service: 05/28/2015 1:30 PM Medical Record Number: [id number] Patient Account Number: [id number] Date of Birth/Sex: 12-25-1955 (59 y.o. Male) Treating RN: Carolyne Fiscal, Debi Primary Care Physician: [name] Other  Clinician: Referring Physician: Treating Physician/Extender: Frann Rider in Treatment: 0 Encounter Discharge Information Items Discharge Pain Level: 10 Discharge Condition: Stable Ambulatory Status: Wheelchair Discharge Destination: Home Transportation: Private Auto Accompanied By: niece Schedule Follow-up Appointment: Yes Medication Reconciliation completed and provided to Patient/Care Yes [place]: Provided on Clinical Summary of Care: 05/28/2015 Form Type Recipient Paper Patient SS Electronic Signature(s) Signed: 05/28/2015 3:21:47 PM By: Ruthine Dose Entered By: Ruthine Dose on 05/28/2015 15:21:47 Elly Modena ([id number]) -------------------------------------------------------------------------------- Lower Extremity Assessment Details Patient Name: Elly Modena. Date of Service: 05/28/2015 1:30 PM Medical Record Number: [id number] Patient Account Number: [id number] Date of Birth/Sex: 1956-03-20 (59 y.o. Male) Treating RN: Ahmed Prima Primary Care Physician: [name] Other Clinician: Referring Physician: Treating Physician/Extender: Frann Rider in Treatment: 0 Edema Assessment Assessed: [Left: No] [Right: No] E[Left: dema] [Right: :] Calf Left: Right: Point of Measurement: cm From Medial Instep 36 cm cm Ankle Left: Right: Point of Measurement: 10 cm From Medial Instep 24 cm cm Vascular Assessment Pulses: Posterior Tibial Extremity colors, hair growth, and conditions: Extremity Color: [Left:Hyperpigmented] Hair Growth on Extremity: [Left:Yes] Temperature of Extremity: [Left:Warm] Capillary Refill: [Left:> 3 seconds] Electronic Signature(s) Signed: 05/30/2015 4:19:08 PM By: Alric Quan Entered By: Alric Quan on 05/28/2015 13:58:51 Elly Modena ([id number]) -------------------------------------------------------------------------------- Multi Wound Chart Details Patient Name: Elly Modena. Date of Service:  05/28/2015 1:30 PM Medical Record Number: [id number] Patient Account Number: [id number] Date of Birth/Sex: 1955-10-21 (59 y.o. Male) Treating RN: Carolyne Fiscal, Debi Primary Care Physician: [name] Other Clinician: Referring Physician: Treating Physician/Extender: Frann Rider in Treatment: 0 Vital Signs Height(in): Pulse(bpm): 92 Weight(lbs): Blood Pressure 168/71 (mmHg): Body Mass Index(BMI): Temperature(F): 98.0 Respiratory Rate 20 (breaths/min): Photos: [1:No Photos] [N/A:N/A] Wound Location: [1:Left Foot - Circumfernential] [N/A:N/A] Wounding Event: [1:Surgical Injury] [N/A:N/A] Primary Etiology: [1:To be determined] [N/A:N/A] Date Acquired: [1:01/02/2015] [N/A:N/A] Weeks of Treatment: [1:0] [N/A:N/A] Wound Status: [1:Open] [N/A:N/A] Measurements L x W x D 5.7x9.8x0.2 [N/A:N/A] (cm) Area (cm) : [1:43.872] [N/A:N/A] Volume (cm) : [1:8.774] [N/A:N/A] Classification: [1:Full Thickness Without  Exposed Support Structures] [N/A:N/A] Exudate Amount: [1:Large] [N/A:N/A] Exudate Type: [1:Serosanguineous] [N/A:N/A] Exudate Color: [1:red, brown] [N/A:N/A] Foul Odor After [1:Yes] [N/A:N/A] Cleansing: Odor Anticipated Due to No [N/A:N/A] Product Use: Wound Margin: [1:Thickened] [N/A:N/A] Granulation Amount: [1:Medium (34-66%)] [N/A:N/A] Granulation Quality: [1:Red] [N/A:N/A] Necrotic Amount: [1:Medium (34-66%)] [N/A:N/A] Necrotic Tissue: [1:Eschar, Adherent Slough] [N/A:N/A] Exposed Structures: [1:Fat: Yes Muscle: No] [N/A:N/A] Epithelialization: [1:None] [N/A:N/A] Periwound Skin Texture: Edema: Yes N/A N/A Periwound Skin Maceration: Yes N/A N/A Moisture: Moist: Yes Periwound Skin Color: No Abnormalities Noted N/A N/A Temperature: No Abnormality N/A N/A Tenderness on Yes N/A N/A Palpation: Wound Preparation: Ulcer Cleansing: N/A N/A Rinsed/Irrigated with Saline Topical Anesthetic Applied: Other: lidocaine 4% Treatment Notes Electronic  Signature(s) Signed: 05/30/2015 4:19:08 PM By: Alric Quan Entered By: Alric Quan on 05/28/2015 14:29:51 Elly Modena ([id number]) -------------------------------------------------------------------------------- [place] Details Patient Name: GERAMIE, MORTAZAVI. Date of Service: 05/28/2015 1:30 PM Medical Record Number: [id number] Patient Account Number: [id number] Date of Birth/Sex: 06/03/1956 (59 y.o. Male) Treating RN: Carolyne Fiscal, Debi Primary Care Physician: [name] Other Clinician: Referring Physician: Treating Physician/Extender: Frann Rider in Treatment: 0 Active Inactive Abuse / Safety / Falls / Self Care Management Nursing Diagnoses: Potential for falls Goals: Patient will remain injury free Date Initiated: 05/28/2015 Goal Status: Active Patient/caregiver will verbalize understanding of skin care regimen Date Initiated: 05/28/2015 Goal Status: Active Patient/caregiver will verbalize/demonstrate measure taken to improve self care Date Initiated: 05/28/2015 Goal Status: Active Interventions: Provide education on fall prevention Notes: Nutrition Nursing Diagnoses: Imbalanced nutrition Goals: Patient/caregiver agrees to and verbalizes understanding of need to obtain nutritional consultation Date Initiated: 05/28/2015 Goal Status: Active Interventions: Assess patient nutrition upon admission and as needed per policy Notes: Orientation to the [place], [place]. ([id number]) Nursing Diagnoses: Knowledge deficit related to the wound healing center program Goals: Patient/caregiver will verbalize understanding of the [place] Program Date Initiated: 05/28/2015 Goal Status: Active Interventions: Provide education on orientation to the wound center Notes: Pain, Acute or Chronic Nursing Diagnoses: Pain Management - Cyclic Acute (Dressing Change Related) Pain Management - Non-cyclic Acute  (Procedural) Pain, acute or chronic: actual or potential Goals: Patient will verbalize adequate pain control and receive pain control interventions during procedures as needed Date Initiated: 05/28/2015 Goal Status: Active Patient/caregiver will verbalize adequate pain control between visits Date Initiated: 05/28/2015 Goal Status: Active Interventions: Assess comfort goal upon admission Complete pain assessment as per visit requirements Notes: Wound/Skin Impairment Nursing Diagnoses: Impaired tissue integrity Knowledge deficit related to smoking impact on wound healing Goals: Patient will demonstrate a reduced rate of smoking or cessation of smoking Date Initiated: 05/28/2015 Goal Status: Active Patient/caregiver will verbalize understanding of skin care regimen SEMISI, HARDEY ([id number]) Date Initiated: 05/28/2015 Goal Status: Active Ulcer/skin breakdown will have a volume reduction of 30% by week 4 Date Initiated: 05/28/2015 Goal Status: Active Ulcer/skin breakdown will have a volume reduction of 50% by week 8 Date Initiated: 05/28/2015 Goal Status: Active Ulcer/skin breakdown will have a volume reduction of 80% by week 12 Date Initiated: 05/28/2015 Goal Status: Active Interventions: Provide education on smoking Notes: Electronic Signature(s) Signed: 05/30/2015 4:19:08 PM By: Alric Quan Entered By: Alric Quan on 05/28/2015 17:57:35 Elly Modena ([id number]) -------------------------------------------------------------------------------- Pain Assessment Details Patient Name: Elly Modena. Date of Service: 05/28/2015 1:30 PM Medical Record Number: [id number] Patient Account Number: [id number] Date of Birth/Sex: 03/24/1956 (59 y.o. Male) Treating RN: Ahmed Prima Primary Care Physician: [name] Other Clinician: Referring Physician: Treating Physician/Extender: Frann Rider in Treatment: 0  Active Problems Location of Pain Severity and  Description of Pain Patient Has Paino Yes Site Locations Pain Location: Pain in Ulcers With Dressing Change: Yes Duration of the Pain. Constant / Intermittento Constant Rate the pain. Current Pain Level: 10 Worst Pain Level: 10 Least Pain Level: 9 Character of Pain Describe the Pain: Aching, Throbbing Pain Management and Medication Current Pain Management: Electronic Signature(s) Signed: 05/30/2015 4:19:08 PM By: Alric Quan Entered By: Alric Quan on 05/28/2015 13:51:14 Elly Modena ([id number]) -------------------------------------------------------------------------------- Patient/Caregiver Education Details Patient Name: Elly Modena. Date of Service: 05/28/2015 1:30 PM Medical Record Number: [id number] Patient Account Number: [id number] Date of Birth/Gender: [birth year] (59 y.o. Male) Treating RN: Carolyne Fiscal, Debi Primary Care Physician: [name] Other Clinician: Referring Physician: Treating Physician/Extender: Frann Rider in Treatment: 0 Education Assessment Education Provided To: Patient Education Topics Provided Wound/Skin Impairment: Handouts: Other: stop smoking and get dressings changed as ordered Methods: Demonstration, Explain/Verbal Responses: State content correctly Electronic Signature(s) Signed: 05/30/2015 4:19:08 PM By: Alric Quan Entered By: Alric Quan on 05/28/2015 15:05:10 Elly Modena ([id number]) -------------------------------------------------------------------------------- Wound Assessment Details Patient Name: Elly Modena. Date of Service: 05/28/2015 1:30 PM Medical Record Number: [id number] Patient Account Number: [id number] Date of Birth/Sex: [birth year] (59 y.o. Male) Treating RN: Carolyne Fiscal, Debi Primary Care Physician: [name] Other Clinician: Referring Physician: Treating Physician/Extender: Frann Rider in Treatment: 0 Wound Status Wound Number: 1 Primary To be  determined Etiology: Wound Location: Left Foot - Circumfernential Wound Open Wounding Event: Surgical Injury Status: Date Acquired: 01/02/2015 Comorbid Asthma, Chronic Obstructive Pulmonary Weeks Of Treatment: 0 History: Disease (COPD), Sleep Apnea, Type II Clustered Wound: No Diabetes, End Stage Renal Disease, Osteoarthritis Wound Measurements Length: (cm) 5.7 Width: (cm) 9.8 Depth: (cm) 0.2 Area: (cm) 43.872 Volume: (cm) 8.774 % Reduction in Area: 0% % Reduction in Volume: 0% Epithelialization: None Tunneling: No Undermining: No Wound Description Full Thickness With Exposed Foul Odor A Classification: Support Structures Due to Prod Diabetic Severity Grade 1 (Wagner): Wound Margin: Thickened Exudate Amount: Large Exudate Type: Serosanguineous Exudate Color: red, brown fter Cleansing: Yes uct Use: No Wound Bed Granulation Amount: Medium (34-66%) Exposed Structure Granulation Quality: Red, Pink Fat Layer Exposed: Yes Necrotic Amount: Medium (34-66%) Muscle Exposed: Yes Necrotic Quality: Eschar, Adherent Slough Necrosis of Muscle: No Bone Exposed: Yes Periwound Skin Texture Texture Color No Abnormalities Noted: No No Abnormalities Noted: No Localized Edema: Yes Temperature / Pain Moisture Temperature: No Abnormality KENDRICKS, BARATZ R. ([id number]) No Abnormalities Noted: No Tenderness on Palpation: Yes Maceration: Yes Moist: Yes Wound Preparation Ulcer Cleansing: Rinsed/Irrigated with Saline Topical Anesthetic Applied: Other: lidocaine 4%, Treatment Notes Wound #1 (Left, Circumferential Foot) 1. Cleansed with: Clean wound with Normal Saline 2. Anesthetic Topical Lidocaine 4% cream to wound bed prior to debridement 3. Peri-wound Care: Skin Prep 4. Dressing Applied: Saline moistened guaze 5. Secondary Dressing Applied ABD and Kerlix/Conform 6. Footwear/Offloading device applied Other footwear/offloading device applied (specify in  notes) Notes offloading shoe Electronic Signature(s) Signed: 05/30/2015 4:19:08 PM By: Alric Quan Entered By: Alric Quan on 05/28/2015 14:52:05 Elly Modena ([id number]) -------------------------------------------------------------------------------- [place] Details Patient Name: Elly Modena. Date of Service: 05/28/2015 1:30 PM Medical Record Number: [id number] Patient Account Number: [id number] Date of Birth/Sex: 1955-11-25 (59 y.o. Male) Treating RN: Carolyne Fiscal, Debi Primary Care Physician: [name] Other Clinician: Referring Physician: Treating Physician/Extender: Frann Rider in Treatment: 0 Vital Signs Time Taken: 13:51 Temperature (F): 98.0 Pulse (bpm): 92 Respiratory Rate (breaths/min): 20 Blood Pressure (mmHg): 168/71 Reference Range: 80 - 120 mg /  dl Electronic Signature(s) Signed: 05/30/2015 4:19:08 PM By: Alric Quan Entered By: Alric Quan on 05/28/2015 13:53:26

## 2015-05-31 NOTE — Progress Notes (Signed)
**Note John [name]-Identified via Obfuscation** JAVEION, CHIMA ([id number]) Visit Report for 05/28/2015 Chief Complaint Document Details Patient Name: John [name], John [name]. Date of Service: 05/28/2015 1:30 PM Medical Record Number: [id number] Patient Account Number: [id number] Date of Birth/Sex: 11-Feb-1956 (59 y.o. Male) Treating RN: Ahmed Prima Primary Care Physician: [name] Other Clinician: Referring Physician: Treating Physician/Extender: Frann Rider in Treatment: 0 Information Obtained from: Patient Chief Complaint Patient presents to the wound care center for a consult due non healing wound of the left foot amputation site which she has had for over 3 months Electronic Signature(s) Signed: 05/29/2015 12:53:29 PM By: Christin Fudge MD, FACS Entered By: Christin Fudge on 05/29/2015 12:53:29 John [name] ([id number]) -------------------------------------------------------------------------------- HPI Details Patient Name: John [name]. Date of Service: 05/28/2015 1:30 PM Medical Record Number: [id number] Patient Account Number: [id number] Date of Birth/Sex: 01/05/1956 (59 y.o. Male) Treating RN: Carolyne Fiscal, Debi Primary Care Physician: [name] Other Clinician: Referring Physician: Treating Physician/Extender: Frann Rider in Treatment: 0 History of Present Illness Location: open wound left foot at the site of previous transmetatarsal amputation Quality: Patient reports experiencing a sharp pain to affected area(s). Severity: Patient states wound are getting worse. Duration: Patient has had the wound for > 3 months prior to seeking treatment at the wound center Timing: Pain in wound is constant (hurts all the time) Context: The wound occurred when the patient had gangrene of the toes and then had to have a transmetatarsal amputation which does not heal Modifying Factors: Other treatment(s) tried include: several debridements antibiotics and trips to the operating room Associated Signs and Symptoms:  Patient reports having foul odor. HPI Description: This 59 year old gentleman has been referred by John [name] of [facility] for a nonhealing wound status post left transmetatarsal amputation done on February 18 2015. From some of the notes available he had a angioplasty of the left SFA prior to September and developed gangrene of all 5 toes. He was then taken up for a left transmetatarsal amputation. His past medical history includes diabetes mellitus, hypertension, peripheral vascular disease, chronic kidney disease stage III, critical limb ischemia, dry gangrene. He was last seen by John [name] on 05/23/2015 and further debridement of necrotic tendon and muscle was done. He has had a wound VAC applied. He has exposed bone visible at the base of the wound. He has been seen regularly since his surgery and debridement on a regular basis has been done including muscle tendon and bone. Electronic Signature(s) Signed: 05/29/2015 12:59:26 PM By: Christin Fudge MD, FACS Previous Signature: 05/29/2015 12:55:55 PM Version By: Christin Fudge MD, FACS Entered By: Christin Fudge on 05/29/2015 12:59:25 John [name] ([id number]) -------------------------------------------------------------------------------- Physical Exam Details Patient Name: John [name], John [name]. Date of Service: 05/28/2015 1:30 PM Medical Record Number: [id number] Patient Account Number: [id number] Date of Birth/Sex: March 16, 1956 (59 y.o. Male) Treating RN: Carolyne Fiscal, Debi Primary Care Physician: [name] Other Clinician: Referring Physician: Treating Physician/Extender: Frann Rider in Treatment: 0 Constitutional . Pulse regular. Respirations normal and unlabored. Afebrile. . Eyes Nonicteric. Reactive to light. Ears, Nose, Mouth, and Throat Lips, teeth, and gums WNL.Marland Kitchen Moist mucosa without lesions. Neck supple and nontender. No palpable supraclavicular or cervical adenopathy. Normal sized without goiter. Respiratory WNL.  No retractions.. Cardiovascular Pedal Pulses not very well palpated at the ankle and ABI could not be measured.. No clubbing, cyanosis or edema. Lymphatic No adneopathy. No adenopathy. No adenopathy. Musculoskeletal Adexa without tenderness or enlargement.. Digits and nails w/o clubbing, cyanosis, infection, petechiae, ischemia, or inflammatory conditions.. Integumentary (Hair, Skin) No  suspicious lesions. No crepitus or fluctuance. No peri-wound warmth or erythema. No masses.Marland Kitchen Psychiatric Judgement and insight Intact.. No evidence of depression, anxiety, or agitation.. Notes the patient has a large open wound on the left transmetatarsal region and the lower part of the wound is gangrenous with a large amount of necrotic tissue and laterally you can palpate bone possibly the fifth metatarsal. Electronic Signature(s) Signed: 05/29/2015 1:00:55 PM By: Christin Fudge MD, FACS Entered By: Christin Fudge on 05/29/2015 13:00:54 John [name] ([id number]) -------------------------------------------------------------------------------- Physician Orders Details Patient Name: John [name]. Date of Service: 05/28/2015 1:30 PM Medical Record Number: [id number] Patient Account Number: [id number] Date of Birth/Sex: 08/02/1955 (59 y.o. Male) Treating RN: Carolyne Fiscal, Debi Primary Care Physician: [name] Other Clinician: Referring Physician: Treating Physician/Extender: Frann Rider in Treatment: 0 Verbal / Phone Orders: Yes Clinician: Pinkerton, Debi Read Back and Verified: Yes Diagnosis Coding Wound Cleansing Wound #1 Left,Circumferential Foot o Clean wound with Normal Saline. Anesthetic Wound #1 Left,Circumferential Foot o Topical Lidocaine 4% cream applied to wound bed prior to debridement Skin Barriers/Peri-Wound Care Wound #1 Left,Circumferential Foot o Skin Prep Primary Wound Dressing Wound #1 Left,Circumferential Foot o Saline moistened gauze Secondary  Dressing Wound #1 Left,Circumferential Foot o ABD and Kerlix/Conform Dressing Change Frequency Wound #1 Left,Circumferential Foot o Other: - home health nurse to place wound vac back on pt Tuesday 05/28/15. Follow-up Appointments Wound #1 Left,Circumferential Foot o Other: - pt to make apt after seeing John [name] on Wednesday 05/30/15. Off-Loading o Other: - wear off loading shoe Notes Please do x-ray of left foot. Electronic Signature(s) DMARI, HARLING ([id number]) Signed: 05/29/2015 4:08:32 PM By: Christin Fudge MD, FACS Signed: 05/30/2015 4:19:08 PM By: Alric Quan Entered By: Alric Quan on 05/28/2015 17:28:05 John [name] ([id number]) -------------------------------------------------------------------------------- Problem List Details Patient Name: John [name], John [name]. Date of Service: 05/28/2015 1:30 PM Medical Record Number: [id number] Patient Account Number: [id number] Date of Birth/Sex: [birth year] (59 y.o. 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, left ankle and foot 05/29/2015 Yes T87.54 Necrosis of amputation stump, left lower extremity 05/29/2015 Yes I70.245 Atherosclerosis of native arteries of left leg with ulceration 05/29/2015 Yes of other part of foot Inactive Problems Resolved Problems Electronic Signature(s) Signed: 05/29/2015 1:03:18 PM By: Christin Fudge MD, FACS Previous Signature: 05/29/2015 12:53:01 PM Version By: Christin Fudge MD, FACS Entered By: Christin Fudge on 05/29/2015 13:03:18 John [name]  ([id number]) -------------------------------------------------------------------------------- Progress Note Details Patient Name: John [name]. Date of Service: 05/28/2015 1:30 PM Medical Record Number: [id number] Patient Account Number: [id number] Date of Birth/Sex: 03/22/1956 (59 y.o. Male) Treating RN: Carolyne Fiscal, Debi Primary Care Physician: [name] Other Clinician: Referring Physician: Treating Physician/Extender: Frann Rider in Treatment: 0 Subjective Chief Complaint Information obtained from Patient Patient presents to the wound care center for a consult due non healing wound of the left foot amputation site which she has had for over 3 months History of Present Illness (HPI) The following HPI elements were documented for the patient's wound: Location: open wound left foot at the site of previous transmetatarsal amputation Quality: Patient reports experiencing a sharp pain to affected area(s). Severity: Patient states wound are getting worse. Duration: Patient has had the wound for > 3 months  prior to seeking treatment at the wound center Timing: Pain in wound is constant (hurts all the time) Context: The wound occurred when the patient had gangrene of the toes and then had to have a transmetatarsal amputation which does not heal Modifying Factors: Other treatment(s) tried include: several debridements antibiotics and trips to the operating room Associated Signs and Symptoms: Patient reports having foul odor. This 59 year old gentleman has been referred by John [name] of [facility] for a nonhealing wound status post left transmetatarsal amputation done on February 18 2015. From some of the notes available he had a angioplasty of the left SFA prior to September and developed gangrene of all 5 toes. He was then taken up for a left transmetatarsal amputation. His past medical history includes diabetes mellitus, hypertension, peripheral vascular disease,  chronic kidney disease stage III, critical limb ischemia, dry gangrene. He was last seen by John [name] on 05/23/2015 and further debridement of necrotic tendon and muscle was done. He has had a wound VAC applied. He has exposed bone visible at the base of the wound. He has been seen regularly since his surgery and debridement on a regular basis has been done including muscle tendon and bone. Wound History Patient presents with 1 open wound that has been present for approximately 20months. Patient has been treating wound in the following manner: wound vac. Laboratory tests have not been performed in the last month. Patient reportedly has not tested positive for an antibiotic resistant organism. Patient reportedly has not tested positive for osteomyelitis. Patient reportedly has had testing performed to evaluate circulation in the legs. Patient experiences the following problems associated with their wounds: infection, swelling. Patient History John [name], John [name] ([id number]) Information obtained from Patient. Allergies Nicoderm patch (Severity: Severe, Reaction: hives) Family History Diabetes - Mother, Siblings, Father, Heart Disease - Mother, Siblings, Father, Hypertension - Father, Mother, Siblings, Kidney Disease - Father, Stroke - Mother, Siblings, No family history of Cancer, Hereditary Spherocytosis, Lung Disease, Seizures, Thyroid Problems, Tuberculosis. Social History Current every day smoker - chantix, Marital Status - Married, Drug Use - No History, Caffeine Use - Daily. Medical History Respiratory Patient has history of Asthma, Chronic Obstructive Pulmonary Disease (COPD), Sleep Apnea Endocrine Patient has history of Type II Diabetes Genitourinary Patient has history of End Stage Renal Disease - hemodyalysis Musculoskeletal Patient has history of Osteoarthritis Patient is treated with Insulin. Blood sugar is tested. Blood sugar results noted at the following times: Breakfast -  yes, Dinner - yes. Review of Systems (ROS) Eyes Complains or has symptoms of Glasses / Contacts - glasses. Ear/Nose/Mouth/Throat The patient has no complaints or symptoms. Hematologic/Lymphatic The patient has no complaints or symptoms. Cardiovascular The patient has no complaints or symptoms. Gastrointestinal The patient has no complaints or symptoms. Immunological The patient has no complaints or symptoms. Integumentary (Skin) Complains or has symptoms of Wounds. Neurologic The patient has no complaints or symptoms. Oncologic The patient has no complaints or symptoms. Psychiatric The patient has no complaints or symptoms. DRAE, MONGAN ([id number]) Medications sulfamethoxazole 400 mg-trimethoprim 80 mg tablet oral 1 1 tablet oral aspirin 81 mg chewable tablet oral 1 1 tablet,chewable oral gabapentin 300 mg capsule oral 1 1 capsule oral atorvastatin 40 mg tablet oral 1 1 tablet oral Fosrenol 1,000 mg chewable tablet oral 2 2 tablet,chewable oral Humalog 100 unit/mL subcutaneous solution subcutaneous solution subcutaneous Lantus 100 unit/mL subcutaneous solution subcutaneous solution subcutaneous oxycodone-acetaminophen 10 mg-325 mg tablet oral 1 1 tablet oral clopidogrel 75 mg tablet oral 1  1 tablet oral Chantix 1 mg tablet oral 1 1 tablet oral Santyl 250 unit/gram topical ointment topical ointment topical Rena-Vite 0.8 mg tablet oral 1 1 tablet oral Objective Constitutional Pulse regular. Respirations normal and unlabored. Afebrile. Vitals Time Taken: 1:51 PM, Temperature: 98.0 F, Pulse: 92 bpm, Respiratory Rate: 20 breaths/min, Blood Pressure: 168/71 mmHg. Eyes Nonicteric. Reactive to light. Ears, Nose, Mouth, and Throat Lips, teeth, and gums WNL.Marland Kitchen Moist mucosa without lesions. Neck supple and nontender. No palpable supraclavicular or cervical adenopathy. Normal sized without goiter. Respiratory WNL. No retractions.. Cardiovascular Pedal Pulses not very well  palpated at the ankle and ABI could not be measured.. No clubbing, cyanosis or edema. Lymphatic No adneopathy. No adenopathy. No adenopathy. Musculoskeletal Adexa without tenderness or enlargement.. Digits and nails w/o clubbing, cyanosis, infection, petechiae, Quayle, Jakori R. ([id number]) ischemia, or inflammatory conditions.Marland Kitchen Psychiatric Judgement and insight Intact.. No evidence of depression, anxiety, or agitation.. General Notes: the patient has a large open wound on the left transmetatarsal region and the lower part of the wound is gangrenous with a large amount of necrotic tissue and laterally you can palpate bone possibly the fifth metatarsal. Integumentary (Hair, Skin) No suspicious lesions. No crepitus or fluctuance. No peri-wound warmth or erythema. No masses.. Wound #1 status is Open. Original cause of wound was Surgical Injury. The wound is located on the Left,Circumferential Foot. The wound measures 5.7cm length x 9.8cm width x 0.2cm depth; 43.872cm^2 area and 8.774cm^3 volume. There is bone, muscle, and fat exposed. There is no tunneling or undermining noted. There is a large amount of serosanguineous drainage noted. The wound margin is thickened. There is medium (34-66%) red, pink granulation within the wound bed. There is a medium (34-66%) amount of necrotic tissue within the wound bed including Eschar and Adherent Slough. The periwound skin appearance exhibited: Localized Edema, Maceration, Moist. Periwound temperature was noted as No Abnormality. The periwound has tenderness on palpation. Assessment Active Problems ICD-10 E11.621 - Type 2 diabetes mellitus with foot ulcer 0000000 - Other complications of procedures, not elsewhere classified, initial encounter M86.372 - Chronic multifocal osteomyelitis, left ankle and foot T87.54 - Necrosis of amputation stump, left lower extremity I70.245 - Atherosclerosis of native arteries of left leg with ulceration of other part  of foot This 59 year old gentleman has a Wagner grade 3 diabetic foot ulcer on the left foot where previous transmetatarsal amputation was done possibly due to gangrene of his forefoot and toes due to previous arthrosclerotic disease of the left leg. He now has a nonhealing wound since September of this year with Pilar Plate necrotic tissue and probing down to bone which is clinically osteomyelitis. He is going to be seeing his vascular surgeon John [name] soon and I have asked them to do a thorough study and review options for surgical debridement. I have ordered x-ray of the right foot and if significant KENY, ARMAND R. ([id number]) osteomellitus after debridement he may benefit from hyperbaric oxygen therapy. Long discussion with patient and his daughter. in the meanwhile we will continue with application of the wound VAC and oral antibiotics as prescribed by his primary surgeon. Plan Wound Cleansing: Wound #1 Left,Circumferential Foot: Clean wound with Normal Saline. Anesthetic: Wound #1 Left,Circumferential Foot: Topical Lidocaine 4% cream applied to wound bed prior to debridement Skin Barriers/Peri-Wound Care: Wound #1 Left,Circumferential Foot: Skin Prep Primary Wound Dressing: Wound #1 Left,Circumferential Foot: Saline moistened gauze Secondary Dressing: Wound #1 Left,Circumferential Foot: ABD and Kerlix/Conform Dressing Change Frequency: Wound #1 Left,Circumferential Foot: Other: - home health nurse  to place wound vac back on pt Tuesday 05/28/15. Follow-up Appointments: Wound #1 Left,Circumferential Foot: Other: - pt to make apt after seeing John [name] on Wednesday 05/30/15. Off-Loading: Other: - wear off loading shoe General Notes: Please do x-ray of left foot. This 60 year old gentleman has a Wagner grade 3 diabetic foot ulcer on the left foot where previous transmetatarsal amputation was done possibly due to gangrene of his forefoot and toes due to previous arthrosclerotic  disease of the left leg. He now has a nonhealing wound since September of this year with Pilar Plate necrotic tissue and probing down to bone which is clinically osteomyelitis. He is going to be seeing his vascular surgeon John [name] soon and I have asked them to do a thorough study and review options for surgical debridement. I have ordered x-ray of the right foot and if significant John [name], John R. ([id number]) osteomellitus after debridement he may benefit from hyperbaric oxygen therapy. Long discussion with patient and his daughter. in the meanwhile we will continue with application of the wound VAC and oral antibiotics as prescribed by his primary surgeon. Electronic Signature(s) Signed: 05/29/2015 1:05:51 PM By: Christin Fudge MD, FACS Previous Signature: 05/29/2015 1:02:15 PM Version By: Christin Fudge MD, FACS Entered By: Christin Fudge on 05/29/2015 13:05:50 John [name] ([id number]) -------------------------------------------------------------------------------- ROS/PFSH Details Patient Name: John [name]. Date of Service: 05/28/2015 1:30 PM Medical Record Number: [id number] Patient Account Number: [id number] Date of Birth/Sex: [birth year] (59 y.o. Male) Treating RN: Carolyne Fiscal, Debi Primary Care Physician: [name] Other Clinician: Referring Physician: Treating Physician/Extender: Frann Rider in Treatment: 0 Information Obtained From Patient Wound History Do you currently have one or more open woundso Yes How many open wounds do you currently haveo 1 Approximately how long have you had your woundso 5months How have you been treating your wound(s) until nowo wound vac Has your wound(s) ever healed and then re-openedo No Have you had any lab work done in the past montho No Have you tested positive for an antibiotic resistant organism (MRSA, VRE)o No Have you tested positive for osteomyelitis (bone infection)o No Have you had any tests for circulation on your legso  Yes Who ordered the testo Dr. Doren Custard, John [name] Where was the test doneo in [place] Have you had other problems associated with your woundso Infection, Swelling Eyes Complaints and Symptoms: Positive for: Glasses / Contacts - glasses Integumentary (Skin) Complaints and Symptoms: Positive for: Wounds Ear/Nose/Mouth/Throat Complaints and Symptoms: No Complaints or Symptoms Hematologic/Lymphatic Complaints and Symptoms: No Complaints or Symptoms Respiratory Medical History: Positive for: Asthma; Chronic Obstructive Pulmonary Disease (COPD); Sleep Apnea Cardiovascular RAIDYN, BIELINSKI ([id number]) Complaints and Symptoms: No Complaints or Symptoms Gastrointestinal Complaints and Symptoms: No Complaints or Symptoms Endocrine Medical History: Positive for: Type II Diabetes Time with diabetes: 20 years Treated with: Insulin Blood sugar tested every day: Yes Tested : 2x a day Blood sugar testing results: Breakfast: yes; Dinner: yes Genitourinary Medical History: Positive for: End Stage Renal Disease - hemodyalysis Immunological Complaints and Symptoms: No Complaints or Symptoms Musculoskeletal Medical History: Positive for: Osteoarthritis Neurologic Complaints and Symptoms: No Complaints or Symptoms Oncologic Complaints and Symptoms: No Complaints or Symptoms Psychiatric Complaints and Symptoms: No Complaints or Symptoms Family and Social History Cancer: No; Diabetes: Yes - Mother, Siblings, Father; Heart Disease: Yes - Mother, Siblings, Father; Hereditary Spherocytosis: No; Hypertension: Yes - Father, Mother, Siblings; Kidney Disease: Yes - Father; John [name], John [name] ([id number]) Lung Disease: No; Seizures: No; Stroke: Yes - Mother, Siblings; Thyroid Problems: No; Tuberculosis: No;  Current every day smoker - chantix; Marital Status - Married; Drug Use: No History; Caffeine Use: Daily; Financial Concerns: No; Food, Clothing or Shelter Needs: No; Support System Lacking: No;  Transportation Concerns: No; Advanced Directives: No; Patient does not want information on Advanced Directives; Do not resuscitate: No; Living Will: No; Medical Power of Attorney: No Physician Affirmation I have reviewed and agree with the above information. Electronic Signature(s) Signed: 05/28/2015 2:37:37 PM By: Christin Fudge MD, FACS Signed: 05/30/2015 4:19:08 PM By: Alric Quan Entered By: Christin Fudge on 05/28/2015 14:37:36 John [name] ([id number]) -------------------------------------------------------------------------------- SuperBill Details Patient Name: John [name], John [name]. Date of Service: 05/28/2015 Medical Record Number: [id number] Patient Account Number: [id number] Date of Birth/Sex: 05/07/1956 (59 y.o. 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 of other part of foot Facility Procedures CPT4 Code: [id number] Description: 99213 - WOUND CARE VISIT-LEV 3 EST PT Modifier: Quantity: 1 Physician Procedures CPT4: Description Modifier Quantity Code [id number] - WC PHYS LEVEL 4 - NEW PT 1 ICD-10 Description Diagnosis E11.621 Type 2 diabetes mellitus with foot ulcer 0000000 Other complications of procedures, not elsewhere classified, initial encounter  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

## 2015-06-02 DIAGNOSIS — D688 Other specified coagulation defects: Secondary | ICD-10-CM | POA: Diagnosis not present

## 2015-06-02 DIAGNOSIS — Z9181 History of falling: Secondary | ICD-10-CM | POA: Diagnosis not present

## 2015-06-02 DIAGNOSIS — D509 Iron deficiency anemia, unspecified: Secondary | ICD-10-CM | POA: Diagnosis not present

## 2015-06-02 DIAGNOSIS — Z4689 Encounter for fitting and adjustment of other specified devices: Secondary | ICD-10-CM | POA: Diagnosis not present

## 2015-06-02 DIAGNOSIS — D631 Anemia in chronic kidney disease: Secondary | ICD-10-CM | POA: Diagnosis not present

## 2015-06-02 DIAGNOSIS — E119 Type 2 diabetes mellitus without complications: Secondary | ICD-10-CM | POA: Diagnosis not present

## 2015-06-02 DIAGNOSIS — R51 Headache: Secondary | ICD-10-CM | POA: Diagnosis not present

## 2015-06-02 DIAGNOSIS — T8781 Dehiscence of amputation stump: Secondary | ICD-10-CM | POA: Diagnosis not present

## 2015-06-02 DIAGNOSIS — N186 End stage renal disease: Secondary | ICD-10-CM | POA: Diagnosis not present

## 2015-06-02 DIAGNOSIS — Z7982 Long term (current) use of aspirin: Secondary | ICD-10-CM | POA: Diagnosis not present

## 2015-06-02 DIAGNOSIS — R52 Pain, unspecified: Secondary | ICD-10-CM | POA: Diagnosis not present

## 2015-06-02 DIAGNOSIS — F17210 Nicotine dependence, cigarettes, uncomplicated: Secondary | ICD-10-CM | POA: Diagnosis not present

## 2015-06-02 DIAGNOSIS — E1151 Type 2 diabetes mellitus with diabetic peripheral angiopathy without gangrene: Secondary | ICD-10-CM | POA: Diagnosis not present

## 2015-06-02 DIAGNOSIS — D689 Coagulation defect, unspecified: Secondary | ICD-10-CM | POA: Diagnosis not present

## 2015-06-02 DIAGNOSIS — R197 Diarrhea, unspecified: Secondary | ICD-10-CM | POA: Diagnosis not present

## 2015-06-04 DIAGNOSIS — N186 End stage renal disease: Secondary | ICD-10-CM | POA: Diagnosis not present

## 2015-06-04 DIAGNOSIS — Z4689 Encounter for fitting and adjustment of other specified devices: Secondary | ICD-10-CM | POA: Diagnosis not present

## 2015-06-04 DIAGNOSIS — Z7982 Long term (current) use of aspirin: Secondary | ICD-10-CM | POA: Diagnosis not present

## 2015-06-04 DIAGNOSIS — E1151 Type 2 diabetes mellitus with diabetic peripheral angiopathy without gangrene: Secondary | ICD-10-CM | POA: Diagnosis not present

## 2015-06-04 DIAGNOSIS — T8781 Dehiscence of amputation stump: Secondary | ICD-10-CM | POA: Diagnosis not present

## 2015-06-04 DIAGNOSIS — Z9181 History of falling: Secondary | ICD-10-CM | POA: Diagnosis not present

## 2015-06-04 DIAGNOSIS — F17210 Nicotine dependence, cigarettes, uncomplicated: Secondary | ICD-10-CM | POA: Diagnosis not present

## 2015-06-05 DIAGNOSIS — D688 Other specified coagulation defects: Secondary | ICD-10-CM | POA: Diagnosis not present

## 2015-06-05 DIAGNOSIS — D631 Anemia in chronic kidney disease: Secondary | ICD-10-CM | POA: Diagnosis not present

## 2015-06-05 DIAGNOSIS — D509 Iron deficiency anemia, unspecified: Secondary | ICD-10-CM | POA: Diagnosis not present

## 2015-06-05 DIAGNOSIS — E119 Type 2 diabetes mellitus without complications: Secondary | ICD-10-CM | POA: Diagnosis not present

## 2015-06-05 DIAGNOSIS — T8189XA Other complications of procedures, not elsewhere classified, initial encounter: Secondary | ICD-10-CM | POA: Diagnosis not present

## 2015-06-05 DIAGNOSIS — R197 Diarrhea, unspecified: Secondary | ICD-10-CM | POA: Diagnosis not present

## 2015-06-05 DIAGNOSIS — D689 Coagulation defect, unspecified: Secondary | ICD-10-CM | POA: Diagnosis not present

## 2015-06-05 DIAGNOSIS — N186 End stage renal disease: Secondary | ICD-10-CM | POA: Diagnosis not present

## 2015-06-05 DIAGNOSIS — R52 Pain, unspecified: Secondary | ICD-10-CM | POA: Diagnosis not present

## 2015-06-05 DIAGNOSIS — R51 Headache: Secondary | ICD-10-CM | POA: Diagnosis not present

## 2015-06-06 ENCOUNTER — Telehealth: Payer: Self-pay | Admitting: *Deleted

## 2015-06-06 ENCOUNTER — Other Ambulatory Visit: Payer: Self-pay | Admitting: Family Medicine

## 2015-06-06 DIAGNOSIS — N186 End stage renal disease: Secondary | ICD-10-CM | POA: Diagnosis not present

## 2015-06-06 DIAGNOSIS — Z7982 Long term (current) use of aspirin: Secondary | ICD-10-CM | POA: Diagnosis not present

## 2015-06-06 DIAGNOSIS — Z9181 History of falling: Secondary | ICD-10-CM | POA: Diagnosis not present

## 2015-06-06 DIAGNOSIS — E1151 Type 2 diabetes mellitus with diabetic peripheral angiopathy without gangrene: Secondary | ICD-10-CM | POA: Diagnosis not present

## 2015-06-06 DIAGNOSIS — F17210 Nicotine dependence, cigarettes, uncomplicated: Secondary | ICD-10-CM | POA: Diagnosis not present

## 2015-06-06 DIAGNOSIS — T8781 Dehiscence of amputation stump: Secondary | ICD-10-CM | POA: Diagnosis not present

## 2015-06-06 DIAGNOSIS — Z992 Dependence on renal dialysis: Secondary | ICD-10-CM

## 2015-06-06 DIAGNOSIS — I739 Peripheral vascular disease, unspecified: Secondary | ICD-10-CM

## 2015-06-06 DIAGNOSIS — Z4689 Encounter for fitting and adjustment of other specified devices: Secondary | ICD-10-CM | POA: Diagnosis not present

## 2015-06-06 DIAGNOSIS — Z8679 Personal history of other diseases of the circulatory system: Secondary | ICD-10-CM

## 2015-06-06 DIAGNOSIS — J438 Other emphysema: Secondary | ICD-10-CM

## 2015-06-06 NOTE — Telephone Encounter (Signed)
Received call form [facility] regarding rx for hospital bed, the following needs to be done in order for patient to receive (and insurance to pay):  1-Rx needs to state that it is for a semi-electric hospital bed with a diagnosis code 2-They also need office notes pertaining to the need of the bed, also needs to have what diagnosis requires the need of bed.  3-Needs to state in office notes that patient requires frequent repositioning.  This needs to be faxed to [facility] at [phone].

## 2015-06-06 NOTE — Assessment & Plan Note (Signed)
CVA  is part of the reason for need of hospital bed.

## 2015-06-06 NOTE — Telephone Encounter (Signed)
Pt wife calling per Dr. Andria Frames to leave a reminder message to put through an Rx for a hospital bed. The fax number for [place] where it needs to be sent is [phone]. Thank you, Fonda Kinder, ASA

## 2015-06-06 NOTE — Telephone Encounter (Signed)
Will forward to MD. Rosalie Gelpi,CMA  

## 2015-06-06 NOTE — Assessment & Plan Note (Signed)
PAD is part of the reason for need of hospital bed.

## 2015-06-06 NOTE — Telephone Encounter (Signed)
Rx and patient demographics faxed to [facility] at [phone].

## 2015-06-06 NOTE — Telephone Encounter (Signed)
Caring for wife in hospital and covering for Dr. Nori Riis.  Patient needs hospital bed.  Currently using his wife's (she needs for end stage COPD) and she is sleeping in a chair.  He needs one of his own.  I am happy to authorize.

## 2015-06-06 NOTE — Assessment & Plan Note (Signed)
COPD is part of the reason for need of hospital bed.

## 2015-06-06 NOTE — Assessment & Plan Note (Signed)
ESRD  is part of the reason for need of hospital bed.

## 2015-06-07 ENCOUNTER — Telehealth: Payer: Self-pay | Admitting: Family Medicine

## 2015-06-07 DIAGNOSIS — D689 Coagulation defect, unspecified: Secondary | ICD-10-CM | POA: Diagnosis not present

## 2015-06-07 DIAGNOSIS — Z4689 Encounter for fitting and adjustment of other specified devices: Secondary | ICD-10-CM | POA: Diagnosis not present

## 2015-06-07 DIAGNOSIS — R51 Headache: Secondary | ICD-10-CM | POA: Diagnosis not present

## 2015-06-07 DIAGNOSIS — E1151 Type 2 diabetes mellitus with diabetic peripheral angiopathy without gangrene: Secondary | ICD-10-CM | POA: Diagnosis not present

## 2015-06-07 DIAGNOSIS — F17210 Nicotine dependence, cigarettes, uncomplicated: Secondary | ICD-10-CM | POA: Diagnosis not present

## 2015-06-07 DIAGNOSIS — N186 End stage renal disease: Secondary | ICD-10-CM | POA: Diagnosis not present

## 2015-06-07 DIAGNOSIS — D509 Iron deficiency anemia, unspecified: Secondary | ICD-10-CM | POA: Diagnosis not present

## 2015-06-07 DIAGNOSIS — E119 Type 2 diabetes mellitus without complications: Secondary | ICD-10-CM | POA: Diagnosis not present

## 2015-06-07 DIAGNOSIS — Z7982 Long term (current) use of aspirin: Secondary | ICD-10-CM | POA: Diagnosis not present

## 2015-06-07 DIAGNOSIS — E1122 Type 2 diabetes mellitus with diabetic chronic kidney disease: Secondary | ICD-10-CM | POA: Diagnosis not present

## 2015-06-07 DIAGNOSIS — R52 Pain, unspecified: Secondary | ICD-10-CM | POA: Diagnosis not present

## 2015-06-07 DIAGNOSIS — D631 Anemia in chronic kidney disease: Secondary | ICD-10-CM | POA: Diagnosis not present

## 2015-06-07 DIAGNOSIS — R197 Diarrhea, unspecified: Secondary | ICD-10-CM | POA: Diagnosis not present

## 2015-06-07 DIAGNOSIS — Z9181 History of falling: Secondary | ICD-10-CM | POA: Diagnosis not present

## 2015-06-07 DIAGNOSIS — T8781 Dehiscence of amputation stump: Secondary | ICD-10-CM | POA: Diagnosis not present

## 2015-06-07 DIAGNOSIS — D688 Other specified coagulation defects: Secondary | ICD-10-CM | POA: Diagnosis not present

## 2015-06-07 NOTE — Telephone Encounter (Signed)
Refill not due until 06/14/15.  Will await Dr. Verlon Au return.

## 2015-06-07 NOTE — Telephone Encounter (Signed)
Pt called and needs a refill on his Percocet left up front. Please call when ready to pick up. jw

## 2015-06-09 DIAGNOSIS — E119 Type 2 diabetes mellitus without complications: Secondary | ICD-10-CM | POA: Diagnosis not present

## 2015-06-09 DIAGNOSIS — E1129 Type 2 diabetes mellitus with other diabetic kidney complication: Secondary | ICD-10-CM | POA: Diagnosis not present

## 2015-06-09 DIAGNOSIS — D631 Anemia in chronic kidney disease: Secondary | ICD-10-CM | POA: Diagnosis not present

## 2015-06-09 DIAGNOSIS — D688 Other specified coagulation defects: Secondary | ICD-10-CM | POA: Diagnosis not present

## 2015-06-09 DIAGNOSIS — Z992 Dependence on renal dialysis: Secondary | ICD-10-CM | POA: Diagnosis not present

## 2015-06-09 DIAGNOSIS — R51 Headache: Secondary | ICD-10-CM | POA: Diagnosis not present

## 2015-06-09 DIAGNOSIS — R52 Pain, unspecified: Secondary | ICD-10-CM | POA: Diagnosis not present

## 2015-06-09 DIAGNOSIS — R197 Diarrhea, unspecified: Secondary | ICD-10-CM | POA: Diagnosis not present

## 2015-06-09 DIAGNOSIS — N186 End stage renal disease: Secondary | ICD-10-CM | POA: Diagnosis not present

## 2015-06-09 DIAGNOSIS — D509 Iron deficiency anemia, unspecified: Secondary | ICD-10-CM | POA: Diagnosis not present

## 2015-06-09 DIAGNOSIS — D689 Coagulation defect, unspecified: Secondary | ICD-10-CM | POA: Diagnosis not present

## 2015-06-11 DIAGNOSIS — Z4689 Encounter for fitting and adjustment of other specified devices: Secondary | ICD-10-CM | POA: Diagnosis not present

## 2015-06-11 DIAGNOSIS — Z7982 Long term (current) use of aspirin: Secondary | ICD-10-CM | POA: Diagnosis not present

## 2015-06-11 DIAGNOSIS — Z9181 History of falling: Secondary | ICD-10-CM | POA: Diagnosis not present

## 2015-06-11 DIAGNOSIS — E1151 Type 2 diabetes mellitus with diabetic peripheral angiopathy without gangrene: Secondary | ICD-10-CM | POA: Diagnosis not present

## 2015-06-11 DIAGNOSIS — T8781 Dehiscence of amputation stump: Secondary | ICD-10-CM | POA: Diagnosis not present

## 2015-06-11 DIAGNOSIS — N186 End stage renal disease: Secondary | ICD-10-CM | POA: Diagnosis not present

## 2015-06-11 DIAGNOSIS — E1122 Type 2 diabetes mellitus with diabetic chronic kidney disease: Secondary | ICD-10-CM | POA: Diagnosis not present

## 2015-06-12 ENCOUNTER — Telehealth: Payer: Self-pay | Admitting: Family Medicine

## 2015-06-12 DIAGNOSIS — D689 Coagulation defect, unspecified: Secondary | ICD-10-CM | POA: Diagnosis not present

## 2015-06-12 DIAGNOSIS — R52 Pain, unspecified: Secondary | ICD-10-CM | POA: Diagnosis not present

## 2015-06-12 DIAGNOSIS — I96 Gangrene, not elsewhere classified: Secondary | ICD-10-CM | POA: Diagnosis not present

## 2015-06-12 DIAGNOSIS — D688 Other specified coagulation defects: Secondary | ICD-10-CM | POA: Diagnosis not present

## 2015-06-12 DIAGNOSIS — D509 Iron deficiency anemia, unspecified: Secondary | ICD-10-CM | POA: Diagnosis not present

## 2015-06-12 DIAGNOSIS — N186 End stage renal disease: Secondary | ICD-10-CM | POA: Diagnosis not present

## 2015-06-12 DIAGNOSIS — R197 Diarrhea, unspecified: Secondary | ICD-10-CM | POA: Diagnosis not present

## 2015-06-12 DIAGNOSIS — E119 Type 2 diabetes mellitus without complications: Secondary | ICD-10-CM | POA: Diagnosis not present

## 2015-06-12 DIAGNOSIS — D631 Anemia in chronic kidney disease: Secondary | ICD-10-CM | POA: Diagnosis not present

## 2015-06-12 NOTE — Telephone Encounter (Signed)
Spoke to pt. He will be here at 22 on Wed. Ottis Stain, CMA

## 2015-06-12 NOTE — Telephone Encounter (Signed)
Dear John [name] Team Please call him and see if you can put himonmy Wed schedule. I have to SEE him in office inorder for Medicare to pat for his bed. I will give himhis refill on percocett then as well See if you can dbl book him around 11 AM THANKS! [name]

## 2015-06-12 NOTE — Telephone Encounter (Signed)
Is checking on the missing info needed for the hospital bed.  Please send the necessary information. Several messages have been left

## 2015-06-12 NOTE — Telephone Encounter (Signed)
Pt is meeting with Dr. Nori Riis Wednesday 06/13/2015 @ 11:00 am to address the hospital bed. Ottis Stain, CMA

## 2015-06-13 ENCOUNTER — Ambulatory Visit: Payer: Medicare Other | Admitting: Family Medicine

## 2015-06-13 ENCOUNTER — Telehealth: Payer: Self-pay | Admitting: Clinical

## 2015-06-13 ENCOUNTER — Ambulatory Visit (INDEPENDENT_AMBULATORY_CARE_PROVIDER_SITE_OTHER): Payer: Medicare Other | Admitting: Family Medicine

## 2015-06-13 ENCOUNTER — Encounter: Payer: Self-pay | Admitting: Family Medicine

## 2015-06-13 VITALS — BP 147/57 | HR 100 | Temp 99.1°F | Ht 71.0 in | Wt 181.7 lb

## 2015-06-13 DIAGNOSIS — I10 Essential (primary) hypertension: Secondary | ICD-10-CM | POA: Diagnosis not present

## 2015-06-13 DIAGNOSIS — G959 Disease of spinal cord, unspecified: Secondary | ICD-10-CM

## 2015-06-13 DIAGNOSIS — I739 Peripheral vascular disease, unspecified: Secondary | ICD-10-CM

## 2015-06-13 DIAGNOSIS — M79622 Pain in left upper arm: Secondary | ICD-10-CM

## 2015-06-13 DIAGNOSIS — M545 Low back pain: Secondary | ICD-10-CM

## 2015-06-13 MED ORDER — OXYCODONE-ACETAMINOPHEN 7.5-325 MG PO TABS: ORAL_TABLET | ORAL | Status: DC

## 2015-06-13 NOTE — Telephone Encounter (Signed)
CSW received a call from [place], RN with [place] that pt is requesting PCS. CSW has placed the application in PCP box for completion. CSW will fax the application to [employer] once completed. Hunt Oris, MSW, [place]

## 2015-06-14 ENCOUNTER — Encounter: Payer: Self-pay | Admitting: Family Medicine

## 2015-06-14 DIAGNOSIS — I96 Gangrene, not elsewhere classified: Secondary | ICD-10-CM | POA: Diagnosis not present

## 2015-06-14 DIAGNOSIS — D689 Coagulation defect, unspecified: Secondary | ICD-10-CM | POA: Diagnosis not present

## 2015-06-14 DIAGNOSIS — R197 Diarrhea, unspecified: Secondary | ICD-10-CM | POA: Diagnosis not present

## 2015-06-14 DIAGNOSIS — R52 Pain, unspecified: Secondary | ICD-10-CM | POA: Diagnosis not present

## 2015-06-14 DIAGNOSIS — D688 Other specified coagulation defects: Secondary | ICD-10-CM | POA: Diagnosis not present

## 2015-06-14 DIAGNOSIS — D509 Iron deficiency anemia, unspecified: Secondary | ICD-10-CM | POA: Diagnosis not present

## 2015-06-14 DIAGNOSIS — E119 Type 2 diabetes mellitus without complications: Secondary | ICD-10-CM | POA: Diagnosis not present

## 2015-06-14 DIAGNOSIS — D631 Anemia in chronic kidney disease: Secondary | ICD-10-CM | POA: Diagnosis not present

## 2015-06-14 DIAGNOSIS — N186 End stage renal disease: Secondary | ICD-10-CM | POA: Diagnosis not present

## 2015-06-14 NOTE — Assessment & Plan Note (Signed)
Impending amputation of his LEFT foot. He has already undergone transmetatarsal amputation and has an open wound. Unclear if this will be a transtibial, BKA or AKA. He's scheduled for some further testing this afternoon. He is essentially unable to weight-bear at all on that foot. Due to his other issues with spinal stenosis and secondary weakness from his previous TIA, he is now only ambulating with wheelchair. He is spending a good amount of his time in bed. He will keep me posted on the progress with the impending".

## 2015-06-14 NOTE — Assessment & Plan Note (Signed)
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

## 2015-06-14 NOTE — Progress Notes (Signed)
   Subjective:    Patient ID: John [name], male    DOB: [birth year], 60 y.o.   MRN: [id number]  HPI Follow-up multiple issues #1. Left foot: Status post transmetatarsal amputation. He shows me some photographs of the large gaping wound. Essentially he does not have any chance of healing this. His IT sales professional hasn't scheduled for some appointments this afternoon for nerve conduction studies, they're planning for some type of amputation probably next week. Unclear if this will be transtibial, BKA, AKA. He is suffering a lot of pain in the [place] despite attempts at revascularization recently. #2. Activity: He is essentially unable to stand without assistance from either a cane or his wheelchair. He can only stand for a minute or 2 before he has to transfer and sulbactam. He is using a wheelchair most of the time. At home he is staying in bed majority of the day because he can elevate his legs that way. He would like to apply for hospital bed so that he can maintain elevation and decreased pain. #3. Chronic pain syndrome. He is continued on her current pain medicine regimen despite the fact that it is not sufficient to control his current pain. He is aware that with critical limb ischemia, is unlikely that we'll be able to get total pain relief with his oral medications. He says he has been "grinning and bearing it" with the hopes that soon they will get to some sort of end point with his revascularization or go ahead and do it. He does need refill on his chronic pain medicines that we are prescribing however. #4. Also continued to have left hand pain. He also notes that the hand is much duskier than that of the right, is cool to touch. His IT sales professional is also aware of this..   Review of Systems He's had unintentional 5-10 pound weight loss because he's felt too bad to eat. He's had no fever, sweats, chills. He has had pain in skin changes as per history of present illness. Denies chest pain. No  shortness of breath.    Objective:   Physical Exam  Vital signs are reviewed GENERAL: Thin male sitting in a wheelchair. Looks some cough will today but no acute distress CV: Regular rate and rhythm EXTREMITY: Left hand is duskier than the right, cooler than the right. He also has some beginning flexion contractures and thenar atrophy of the left hand. The left foot is in a protective bandage and postop shoe and I did not remove that. He did show me pictures from yesterday where they redressed it. The pictures reveal a large area of open wound in the transmetatarsal portion of the foot. Essentially the whole into the foot is open. MSK: He can rise in wheelchair with some use of his arms and his cane. He is not stable standing on his right foot. He is able to transfer from wheelchair to chair.       Assessment & Plan:  Will try to get him set up with hospital bed and an aid

## 2015-06-14 NOTE — Assessment & Plan Note (Signed)
Refilled chronic pain meds today.

## 2015-06-14 NOTE — Assessment & Plan Note (Signed)
Likely contibuting to his LE B weakness No surgical intervention indicated at this time

## 2015-06-15 ENCOUNTER — Telehealth: Payer: Self-pay | Admitting: Family Medicine

## 2015-06-15 DIAGNOSIS — N186 End stage renal disease: Secondary | ICD-10-CM | POA: Diagnosis not present

## 2015-06-15 DIAGNOSIS — E1151 Type 2 diabetes mellitus with diabetic peripheral angiopathy without gangrene: Secondary | ICD-10-CM | POA: Diagnosis not present

## 2015-06-15 DIAGNOSIS — Z4689 Encounter for fitting and adjustment of other specified devices: Secondary | ICD-10-CM | POA: Diagnosis not present

## 2015-06-15 DIAGNOSIS — G5602 Carpal tunnel syndrome, left upper limb: Secondary | ICD-10-CM | POA: Diagnosis not present

## 2015-06-15 DIAGNOSIS — Z9181 History of falling: Secondary | ICD-10-CM | POA: Diagnosis not present

## 2015-06-15 DIAGNOSIS — G5601 Carpal tunnel syndrome, right upper limb: Secondary | ICD-10-CM | POA: Diagnosis not present

## 2015-06-15 DIAGNOSIS — M47812 Spondylosis without myelopathy or radiculopathy, cervical region: Secondary | ICD-10-CM | POA: Diagnosis not present

## 2015-06-15 DIAGNOSIS — T8781 Dehiscence of amputation stump: Secondary | ICD-10-CM | POA: Diagnosis not present

## 2015-06-15 DIAGNOSIS — Z7982 Long term (current) use of aspirin: Secondary | ICD-10-CM | POA: Diagnosis not present

## 2015-06-15 DIAGNOSIS — E1122 Type 2 diabetes mellitus with diabetic chronic kidney disease: Secondary | ICD-10-CM | POA: Diagnosis not present

## 2015-06-15 NOTE — Telephone Encounter (Signed)
Dan Europe called and would like verbal orders for PT for the patient. 1 time for 1 week and 2 times for 7 weeks. It is a secure voicemail so you would be able to leave a message if no answer. jw

## 2015-06-15 NOTE — Telephone Encounter (Signed)
Spoke to Dr. Nori Riis who did give the verbal ok for this and I contacted Dan Europe and gave her the order. Katharina Caper, April D, [place]

## 2015-06-16 DIAGNOSIS — N186 End stage renal disease: Secondary | ICD-10-CM | POA: Diagnosis not present

## 2015-06-16 DIAGNOSIS — R52 Pain, unspecified: Secondary | ICD-10-CM | POA: Diagnosis not present

## 2015-06-16 DIAGNOSIS — D509 Iron deficiency anemia, unspecified: Secondary | ICD-10-CM | POA: Diagnosis not present

## 2015-06-16 DIAGNOSIS — D631 Anemia in chronic kidney disease: Secondary | ICD-10-CM | POA: Diagnosis not present

## 2015-06-16 DIAGNOSIS — D689 Coagulation defect, unspecified: Secondary | ICD-10-CM | POA: Diagnosis not present

## 2015-06-16 DIAGNOSIS — E119 Type 2 diabetes mellitus without complications: Secondary | ICD-10-CM | POA: Diagnosis not present

## 2015-06-16 DIAGNOSIS — D688 Other specified coagulation defects: Secondary | ICD-10-CM | POA: Diagnosis not present

## 2015-06-16 DIAGNOSIS — R197 Diarrhea, unspecified: Secondary | ICD-10-CM | POA: Diagnosis not present

## 2015-06-16 DIAGNOSIS — I96 Gangrene, not elsewhere classified: Secondary | ICD-10-CM | POA: Diagnosis not present

## 2015-06-18 DIAGNOSIS — T8781 Dehiscence of amputation stump: Secondary | ICD-10-CM | POA: Diagnosis not present

## 2015-06-18 DIAGNOSIS — N186 End stage renal disease: Secondary | ICD-10-CM | POA: Diagnosis not present

## 2015-06-18 DIAGNOSIS — E1151 Type 2 diabetes mellitus with diabetic peripheral angiopathy without gangrene: Secondary | ICD-10-CM | POA: Diagnosis not present

## 2015-06-18 DIAGNOSIS — Z4689 Encounter for fitting and adjustment of other specified devices: Secondary | ICD-10-CM | POA: Diagnosis not present

## 2015-06-18 DIAGNOSIS — Z7982 Long term (current) use of aspirin: Secondary | ICD-10-CM | POA: Diagnosis not present

## 2015-06-18 DIAGNOSIS — E1122 Type 2 diabetes mellitus with diabetic chronic kidney disease: Secondary | ICD-10-CM | POA: Diagnosis not present

## 2015-06-18 DIAGNOSIS — Z9181 History of falling: Secondary | ICD-10-CM | POA: Diagnosis not present

## 2015-06-19 DIAGNOSIS — T8781 Dehiscence of amputation stump: Secondary | ICD-10-CM | POA: Diagnosis not present

## 2015-06-19 DIAGNOSIS — D509 Iron deficiency anemia, unspecified: Secondary | ICD-10-CM | POA: Diagnosis not present

## 2015-06-19 DIAGNOSIS — D631 Anemia in chronic kidney disease: Secondary | ICD-10-CM | POA: Diagnosis not present

## 2015-06-19 DIAGNOSIS — Z7982 Long term (current) use of aspirin: Secondary | ICD-10-CM | POA: Diagnosis not present

## 2015-06-19 DIAGNOSIS — N186 End stage renal disease: Secondary | ICD-10-CM | POA: Diagnosis not present

## 2015-06-19 DIAGNOSIS — Z4689 Encounter for fitting and adjustment of other specified devices: Secondary | ICD-10-CM | POA: Diagnosis not present

## 2015-06-19 DIAGNOSIS — E1122 Type 2 diabetes mellitus with diabetic chronic kidney disease: Secondary | ICD-10-CM | POA: Diagnosis not present

## 2015-06-19 DIAGNOSIS — D688 Other specified coagulation defects: Secondary | ICD-10-CM | POA: Diagnosis not present

## 2015-06-19 DIAGNOSIS — I96 Gangrene, not elsewhere classified: Secondary | ICD-10-CM | POA: Diagnosis not present

## 2015-06-19 DIAGNOSIS — Z9181 History of falling: Secondary | ICD-10-CM | POA: Diagnosis not present

## 2015-06-19 DIAGNOSIS — E1151 Type 2 diabetes mellitus with diabetic peripheral angiopathy without gangrene: Secondary | ICD-10-CM | POA: Diagnosis not present

## 2015-06-19 DIAGNOSIS — E119 Type 2 diabetes mellitus without complications: Secondary | ICD-10-CM | POA: Diagnosis not present

## 2015-06-19 DIAGNOSIS — R197 Diarrhea, unspecified: Secondary | ICD-10-CM | POA: Diagnosis not present

## 2015-06-19 DIAGNOSIS — R52 Pain, unspecified: Secondary | ICD-10-CM | POA: Diagnosis not present

## 2015-06-19 DIAGNOSIS — D689 Coagulation defect, unspecified: Secondary | ICD-10-CM | POA: Diagnosis not present

## 2015-06-20 DIAGNOSIS — S91302D Unspecified open wound, left foot, subsequent encounter: Secondary | ICD-10-CM | POA: Diagnosis not present

## 2015-06-20 DIAGNOSIS — I739 Peripheral vascular disease, unspecified: Secondary | ICD-10-CM | POA: Diagnosis not present

## 2015-06-21 DIAGNOSIS — D688 Other specified coagulation defects: Secondary | ICD-10-CM | POA: Diagnosis not present

## 2015-06-21 DIAGNOSIS — Z4689 Encounter for fitting and adjustment of other specified devices: Secondary | ICD-10-CM | POA: Diagnosis not present

## 2015-06-21 DIAGNOSIS — D689 Coagulation defect, unspecified: Secondary | ICD-10-CM | POA: Diagnosis not present

## 2015-06-21 DIAGNOSIS — D631 Anemia in chronic kidney disease: Secondary | ICD-10-CM | POA: Diagnosis not present

## 2015-06-21 DIAGNOSIS — I96 Gangrene, not elsewhere classified: Secondary | ICD-10-CM | POA: Diagnosis not present

## 2015-06-21 DIAGNOSIS — N186 End stage renal disease: Secondary | ICD-10-CM | POA: Diagnosis not present

## 2015-06-21 DIAGNOSIS — E1122 Type 2 diabetes mellitus with diabetic chronic kidney disease: Secondary | ICD-10-CM | POA: Diagnosis not present

## 2015-06-21 DIAGNOSIS — D509 Iron deficiency anemia, unspecified: Secondary | ICD-10-CM | POA: Diagnosis not present

## 2015-06-21 DIAGNOSIS — E1151 Type 2 diabetes mellitus with diabetic peripheral angiopathy without gangrene: Secondary | ICD-10-CM | POA: Diagnosis not present

## 2015-06-21 DIAGNOSIS — T8781 Dehiscence of amputation stump: Secondary | ICD-10-CM | POA: Diagnosis not present

## 2015-06-21 DIAGNOSIS — Z9181 History of falling: Secondary | ICD-10-CM | POA: Diagnosis not present

## 2015-06-21 DIAGNOSIS — R52 Pain, unspecified: Secondary | ICD-10-CM | POA: Diagnosis not present

## 2015-06-21 DIAGNOSIS — Z7982 Long term (current) use of aspirin: Secondary | ICD-10-CM | POA: Diagnosis not present

## 2015-06-21 DIAGNOSIS — E119 Type 2 diabetes mellitus without complications: Secondary | ICD-10-CM | POA: Diagnosis not present

## 2015-06-21 DIAGNOSIS — R197 Diarrhea, unspecified: Secondary | ICD-10-CM | POA: Diagnosis not present

## 2015-06-22 ENCOUNTER — Encounter: Payer: Self-pay | Admitting: Clinical

## 2015-06-22 NOTE — Progress Notes (Signed)
Completed PCS form has been faxed to [employer].  Hunt Oris, MSW, [place]

## 2015-06-23 DIAGNOSIS — N186 End stage renal disease: Secondary | ICD-10-CM | POA: Diagnosis not present

## 2015-06-23 DIAGNOSIS — R197 Diarrhea, unspecified: Secondary | ICD-10-CM | POA: Diagnosis not present

## 2015-06-23 DIAGNOSIS — D509 Iron deficiency anemia, unspecified: Secondary | ICD-10-CM | POA: Diagnosis not present

## 2015-06-23 DIAGNOSIS — D631 Anemia in chronic kidney disease: Secondary | ICD-10-CM | POA: Diagnosis not present

## 2015-06-23 DIAGNOSIS — D688 Other specified coagulation defects: Secondary | ICD-10-CM | POA: Diagnosis not present

## 2015-06-23 DIAGNOSIS — D689 Coagulation defect, unspecified: Secondary | ICD-10-CM | POA: Diagnosis not present

## 2015-06-23 DIAGNOSIS — R52 Pain, unspecified: Secondary | ICD-10-CM | POA: Diagnosis not present

## 2015-06-23 DIAGNOSIS — I96 Gangrene, not elsewhere classified: Secondary | ICD-10-CM | POA: Diagnosis not present

## 2015-06-23 DIAGNOSIS — E119 Type 2 diabetes mellitus without complications: Secondary | ICD-10-CM | POA: Diagnosis not present

## 2015-06-25 DIAGNOSIS — Z7982 Long term (current) use of aspirin: Secondary | ICD-10-CM | POA: Diagnosis not present

## 2015-06-25 DIAGNOSIS — N186 End stage renal disease: Secondary | ICD-10-CM | POA: Diagnosis not present

## 2015-06-25 DIAGNOSIS — T8781 Dehiscence of amputation stump: Secondary | ICD-10-CM | POA: Diagnosis not present

## 2015-06-25 DIAGNOSIS — E1151 Type 2 diabetes mellitus with diabetic peripheral angiopathy without gangrene: Secondary | ICD-10-CM | POA: Diagnosis not present

## 2015-06-25 DIAGNOSIS — E1122 Type 2 diabetes mellitus with diabetic chronic kidney disease: Secondary | ICD-10-CM | POA: Diagnosis not present

## 2015-06-25 DIAGNOSIS — Z4689 Encounter for fitting and adjustment of other specified devices: Secondary | ICD-10-CM | POA: Diagnosis not present

## 2015-06-25 DIAGNOSIS — Z9181 History of falling: Secondary | ICD-10-CM | POA: Diagnosis not present

## 2015-06-26 DIAGNOSIS — Z7982 Long term (current) use of aspirin: Secondary | ICD-10-CM | POA: Diagnosis not present

## 2015-06-26 DIAGNOSIS — N186 End stage renal disease: Secondary | ICD-10-CM | POA: Diagnosis not present

## 2015-06-26 DIAGNOSIS — Z4689 Encounter for fitting and adjustment of other specified devices: Secondary | ICD-10-CM | POA: Diagnosis not present

## 2015-06-26 DIAGNOSIS — D689 Coagulation defect, unspecified: Secondary | ICD-10-CM | POA: Diagnosis not present

## 2015-06-26 DIAGNOSIS — T8781 Dehiscence of amputation stump: Secondary | ICD-10-CM | POA: Diagnosis not present

## 2015-06-26 DIAGNOSIS — D688 Other specified coagulation defects: Secondary | ICD-10-CM | POA: Diagnosis not present

## 2015-06-26 DIAGNOSIS — D509 Iron deficiency anemia, unspecified: Secondary | ICD-10-CM | POA: Diagnosis not present

## 2015-06-26 DIAGNOSIS — E1151 Type 2 diabetes mellitus with diabetic peripheral angiopathy without gangrene: Secondary | ICD-10-CM | POA: Diagnosis not present

## 2015-06-26 DIAGNOSIS — E1122 Type 2 diabetes mellitus with diabetic chronic kidney disease: Secondary | ICD-10-CM | POA: Diagnosis not present

## 2015-06-26 DIAGNOSIS — E119 Type 2 diabetes mellitus without complications: Secondary | ICD-10-CM | POA: Diagnosis not present

## 2015-06-26 DIAGNOSIS — R52 Pain, unspecified: Secondary | ICD-10-CM | POA: Diagnosis not present

## 2015-06-26 DIAGNOSIS — Z9181 History of falling: Secondary | ICD-10-CM | POA: Diagnosis not present

## 2015-06-26 DIAGNOSIS — R197 Diarrhea, unspecified: Secondary | ICD-10-CM | POA: Diagnosis not present

## 2015-06-26 DIAGNOSIS — D631 Anemia in chronic kidney disease: Secondary | ICD-10-CM | POA: Diagnosis not present

## 2015-06-26 DIAGNOSIS — I96 Gangrene, not elsewhere classified: Secondary | ICD-10-CM | POA: Diagnosis not present

## 2015-06-27 ENCOUNTER — Ambulatory Visit (INDEPENDENT_AMBULATORY_CARE_PROVIDER_SITE_OTHER): Payer: Medicare Other | Admitting: Family Medicine

## 2015-06-27 ENCOUNTER — Other Ambulatory Visit: Payer: Self-pay | Admitting: Family Medicine

## 2015-06-27 DIAGNOSIS — T8781 Dehiscence of amputation stump: Secondary | ICD-10-CM | POA: Diagnosis not present

## 2015-06-27 DIAGNOSIS — E1151 Type 2 diabetes mellitus with diabetic peripheral angiopathy without gangrene: Secondary | ICD-10-CM | POA: Diagnosis not present

## 2015-06-27 DIAGNOSIS — I739 Peripheral vascular disease, unspecified: Secondary | ICD-10-CM

## 2015-06-27 DIAGNOSIS — E1129 Type 2 diabetes mellitus with other diabetic kidney complication: Secondary | ICD-10-CM | POA: Diagnosis not present

## 2015-06-27 DIAGNOSIS — N186 End stage renal disease: Secondary | ICD-10-CM | POA: Diagnosis not present

## 2015-06-27 DIAGNOSIS — Z7982 Long term (current) use of aspirin: Secondary | ICD-10-CM | POA: Diagnosis not present

## 2015-06-27 DIAGNOSIS — E1122 Type 2 diabetes mellitus with diabetic chronic kidney disease: Secondary | ICD-10-CM | POA: Diagnosis not present

## 2015-06-27 DIAGNOSIS — Z9181 History of falling: Secondary | ICD-10-CM | POA: Diagnosis not present

## 2015-06-27 DIAGNOSIS — Z4689 Encounter for fitting and adjustment of other specified devices: Secondary | ICD-10-CM | POA: Diagnosis not present

## 2015-06-27 MED ORDER — OXYCODONE-ACETAMINOPHEN 10-325 MG PO TABS: 1.0000 | ORAL_TABLET | Freq: Four times a day (QID) | ORAL | Status: DC | PRN

## 2015-06-27 NOTE — Progress Notes (Signed)
   Subjective:    Patient ID: John [name], male    DOB: 17-Mar-1956, 60 y.o.   MRN: [id number]  HPI  increased left foot pain. He still has an open section above the transmetatarsal amputation site.  He continues to have also claudication pain.   Review of Systems  no fever.    Objective:   Physical Exam   examination of left foot amputation site reveals large area of denuded tissue. There is essentially no healing of this amputation. There's not total skin coverage      Assessment & Plan:

## 2015-06-27 NOTE — Assessment & Plan Note (Signed)
Increased pain of his left lower extremity status post transmetatarsal amputation. They're planning a subsequent amputation higher up , possibly BKA or even AKA. Is having increased pain. We'll change him from his current medicine regimen to Percocet 10/325 4 times a day. Prescription given. He will bring the other prescription I gave him at last office visit back. I'll see him in one month

## 2015-06-28 DIAGNOSIS — D631 Anemia in chronic kidney disease: Secondary | ICD-10-CM | POA: Diagnosis not present

## 2015-06-28 DIAGNOSIS — I96 Gangrene, not elsewhere classified: Secondary | ICD-10-CM | POA: Diagnosis not present

## 2015-06-28 DIAGNOSIS — R52 Pain, unspecified: Secondary | ICD-10-CM | POA: Diagnosis not present

## 2015-06-28 DIAGNOSIS — N186 End stage renal disease: Secondary | ICD-10-CM | POA: Diagnosis not present

## 2015-06-28 DIAGNOSIS — D509 Iron deficiency anemia, unspecified: Secondary | ICD-10-CM | POA: Diagnosis not present

## 2015-06-28 DIAGNOSIS — D688 Other specified coagulation defects: Secondary | ICD-10-CM | POA: Diagnosis not present

## 2015-06-28 DIAGNOSIS — E119 Type 2 diabetes mellitus without complications: Secondary | ICD-10-CM | POA: Diagnosis not present

## 2015-06-28 DIAGNOSIS — R197 Diarrhea, unspecified: Secondary | ICD-10-CM | POA: Diagnosis not present

## 2015-06-28 DIAGNOSIS — D689 Coagulation defect, unspecified: Secondary | ICD-10-CM | POA: Diagnosis not present

## 2015-06-29 DIAGNOSIS — N186 End stage renal disease: Secondary | ICD-10-CM | POA: Diagnosis not present

## 2015-06-29 DIAGNOSIS — E1151 Type 2 diabetes mellitus with diabetic peripheral angiopathy without gangrene: Secondary | ICD-10-CM | POA: Diagnosis not present

## 2015-06-29 DIAGNOSIS — Z9181 History of falling: Secondary | ICD-10-CM | POA: Diagnosis not present

## 2015-06-29 DIAGNOSIS — Z7982 Long term (current) use of aspirin: Secondary | ICD-10-CM | POA: Diagnosis not present

## 2015-06-29 DIAGNOSIS — T8781 Dehiscence of amputation stump: Secondary | ICD-10-CM | POA: Diagnosis not present

## 2015-06-29 DIAGNOSIS — Z4689 Encounter for fitting and adjustment of other specified devices: Secondary | ICD-10-CM | POA: Diagnosis not present

## 2015-06-29 DIAGNOSIS — E1122 Type 2 diabetes mellitus with diabetic chronic kidney disease: Secondary | ICD-10-CM | POA: Diagnosis not present

## 2015-06-30 DIAGNOSIS — D689 Coagulation defect, unspecified: Secondary | ICD-10-CM | POA: Diagnosis not present

## 2015-06-30 DIAGNOSIS — D631 Anemia in chronic kidney disease: Secondary | ICD-10-CM | POA: Diagnosis not present

## 2015-06-30 DIAGNOSIS — D688 Other specified coagulation defects: Secondary | ICD-10-CM | POA: Diagnosis not present

## 2015-06-30 DIAGNOSIS — E119 Type 2 diabetes mellitus without complications: Secondary | ICD-10-CM | POA: Diagnosis not present

## 2015-06-30 DIAGNOSIS — I96 Gangrene, not elsewhere classified: Secondary | ICD-10-CM | POA: Diagnosis not present

## 2015-06-30 DIAGNOSIS — N186 End stage renal disease: Secondary | ICD-10-CM | POA: Diagnosis not present

## 2015-06-30 DIAGNOSIS — D509 Iron deficiency anemia, unspecified: Secondary | ICD-10-CM | POA: Diagnosis not present

## 2015-06-30 DIAGNOSIS — R52 Pain, unspecified: Secondary | ICD-10-CM | POA: Diagnosis not present

## 2015-06-30 DIAGNOSIS — R197 Diarrhea, unspecified: Secondary | ICD-10-CM | POA: Diagnosis not present

## 2015-07-03 DIAGNOSIS — E1151 Type 2 diabetes mellitus with diabetic peripheral angiopathy without gangrene: Secondary | ICD-10-CM | POA: Diagnosis not present

## 2015-07-03 DIAGNOSIS — R52 Pain, unspecified: Secondary | ICD-10-CM | POA: Diagnosis not present

## 2015-07-03 DIAGNOSIS — E119 Type 2 diabetes mellitus without complications: Secondary | ICD-10-CM | POA: Diagnosis not present

## 2015-07-03 DIAGNOSIS — R197 Diarrhea, unspecified: Secondary | ICD-10-CM | POA: Diagnosis not present

## 2015-07-03 DIAGNOSIS — I96 Gangrene, not elsewhere classified: Secondary | ICD-10-CM | POA: Diagnosis not present

## 2015-07-03 DIAGNOSIS — D689 Coagulation defect, unspecified: Secondary | ICD-10-CM | POA: Diagnosis not present

## 2015-07-03 DIAGNOSIS — D509 Iron deficiency anemia, unspecified: Secondary | ICD-10-CM | POA: Diagnosis not present

## 2015-07-03 DIAGNOSIS — D688 Other specified coagulation defects: Secondary | ICD-10-CM | POA: Diagnosis not present

## 2015-07-03 DIAGNOSIS — N186 End stage renal disease: Secondary | ICD-10-CM | POA: Diagnosis not present

## 2015-07-03 DIAGNOSIS — T8781 Dehiscence of amputation stump: Secondary | ICD-10-CM | POA: Diagnosis not present

## 2015-07-03 DIAGNOSIS — E1122 Type 2 diabetes mellitus with diabetic chronic kidney disease: Secondary | ICD-10-CM | POA: Diagnosis not present

## 2015-07-03 DIAGNOSIS — Z4689 Encounter for fitting and adjustment of other specified devices: Secondary | ICD-10-CM | POA: Diagnosis not present

## 2015-07-03 DIAGNOSIS — Z7982 Long term (current) use of aspirin: Secondary | ICD-10-CM | POA: Diagnosis not present

## 2015-07-03 DIAGNOSIS — D631 Anemia in chronic kidney disease: Secondary | ICD-10-CM | POA: Diagnosis not present

## 2015-07-03 DIAGNOSIS — Z9181 History of falling: Secondary | ICD-10-CM | POA: Diagnosis not present

## 2015-07-04 DIAGNOSIS — Z01818 Encounter for other preprocedural examination: Secondary | ICD-10-CM | POA: Diagnosis not present

## 2015-07-05 DIAGNOSIS — D688 Other specified coagulation defects: Secondary | ICD-10-CM | POA: Diagnosis not present

## 2015-07-05 DIAGNOSIS — Z4689 Encounter for fitting and adjustment of other specified devices: Secondary | ICD-10-CM | POA: Diagnosis not present

## 2015-07-05 DIAGNOSIS — E1151 Type 2 diabetes mellitus with diabetic peripheral angiopathy without gangrene: Secondary | ICD-10-CM | POA: Diagnosis not present

## 2015-07-05 DIAGNOSIS — N186 End stage renal disease: Secondary | ICD-10-CM | POA: Diagnosis not present

## 2015-07-05 DIAGNOSIS — D509 Iron deficiency anemia, unspecified: Secondary | ICD-10-CM | POA: Diagnosis not present

## 2015-07-05 DIAGNOSIS — D631 Anemia in chronic kidney disease: Secondary | ICD-10-CM | POA: Diagnosis not present

## 2015-07-05 DIAGNOSIS — I96 Gangrene, not elsewhere classified: Secondary | ICD-10-CM | POA: Diagnosis not present

## 2015-07-05 DIAGNOSIS — E1122 Type 2 diabetes mellitus with diabetic chronic kidney disease: Secondary | ICD-10-CM | POA: Diagnosis not present

## 2015-07-05 DIAGNOSIS — R197 Diarrhea, unspecified: Secondary | ICD-10-CM | POA: Diagnosis not present

## 2015-07-05 DIAGNOSIS — Z9181 History of falling: Secondary | ICD-10-CM | POA: Diagnosis not present

## 2015-07-05 DIAGNOSIS — D689 Coagulation defect, unspecified: Secondary | ICD-10-CM | POA: Diagnosis not present

## 2015-07-05 DIAGNOSIS — Z7982 Long term (current) use of aspirin: Secondary | ICD-10-CM | POA: Diagnosis not present

## 2015-07-05 DIAGNOSIS — T8781 Dehiscence of amputation stump: Secondary | ICD-10-CM | POA: Diagnosis not present

## 2015-07-05 DIAGNOSIS — G5603 Carpal tunnel syndrome, bilateral upper limbs: Secondary | ICD-10-CM | POA: Diagnosis not present

## 2015-07-05 DIAGNOSIS — E119 Type 2 diabetes mellitus without complications: Secondary | ICD-10-CM | POA: Diagnosis not present

## 2015-07-05 DIAGNOSIS — R52 Pain, unspecified: Secondary | ICD-10-CM | POA: Diagnosis not present

## 2015-07-06 DIAGNOSIS — G5621 Lesion of ulnar nerve, right upper limb: Secondary | ICD-10-CM | POA: Diagnosis not present

## 2015-07-06 DIAGNOSIS — G5601 Carpal tunnel syndrome, right upper limb: Secondary | ICD-10-CM | POA: Diagnosis not present

## 2015-07-06 DIAGNOSIS — N186 End stage renal disease: Secondary | ICD-10-CM | POA: Diagnosis not present

## 2015-07-06 DIAGNOSIS — Z4689 Encounter for fitting and adjustment of other specified devices: Secondary | ICD-10-CM | POA: Diagnosis not present

## 2015-07-06 DIAGNOSIS — G5602 Carpal tunnel syndrome, left upper limb: Secondary | ICD-10-CM | POA: Diagnosis not present

## 2015-07-06 DIAGNOSIS — Z9181 History of falling: Secondary | ICD-10-CM | POA: Diagnosis not present

## 2015-07-06 DIAGNOSIS — E1122 Type 2 diabetes mellitus with diabetic chronic kidney disease: Secondary | ICD-10-CM | POA: Diagnosis not present

## 2015-07-06 DIAGNOSIS — E1151 Type 2 diabetes mellitus with diabetic peripheral angiopathy without gangrene: Secondary | ICD-10-CM | POA: Diagnosis not present

## 2015-07-06 DIAGNOSIS — T8781 Dehiscence of amputation stump: Secondary | ICD-10-CM | POA: Diagnosis not present

## 2015-07-06 DIAGNOSIS — Z7982 Long term (current) use of aspirin: Secondary | ICD-10-CM | POA: Diagnosis not present

## 2015-07-06 DIAGNOSIS — G5622 Lesion of ulnar nerve, left upper limb: Secondary | ICD-10-CM | POA: Diagnosis not present

## 2015-07-07 DIAGNOSIS — E119 Type 2 diabetes mellitus without complications: Secondary | ICD-10-CM | POA: Diagnosis not present

## 2015-07-07 DIAGNOSIS — R52 Pain, unspecified: Secondary | ICD-10-CM | POA: Diagnosis not present

## 2015-07-07 DIAGNOSIS — D509 Iron deficiency anemia, unspecified: Secondary | ICD-10-CM | POA: Diagnosis not present

## 2015-07-07 DIAGNOSIS — I96 Gangrene, not elsewhere classified: Secondary | ICD-10-CM | POA: Diagnosis not present

## 2015-07-07 DIAGNOSIS — N186 End stage renal disease: Secondary | ICD-10-CM | POA: Diagnosis not present

## 2015-07-07 DIAGNOSIS — R197 Diarrhea, unspecified: Secondary | ICD-10-CM | POA: Diagnosis not present

## 2015-07-07 DIAGNOSIS — D689 Coagulation defect, unspecified: Secondary | ICD-10-CM | POA: Diagnosis not present

## 2015-07-07 DIAGNOSIS — D688 Other specified coagulation defects: Secondary | ICD-10-CM | POA: Diagnosis not present

## 2015-07-07 DIAGNOSIS — D631 Anemia in chronic kidney disease: Secondary | ICD-10-CM | POA: Diagnosis not present

## 2015-07-09 DIAGNOSIS — T8189XA Other complications of procedures, not elsewhere classified, initial encounter: Secondary | ICD-10-CM | POA: Diagnosis not present

## 2015-07-09 DIAGNOSIS — L905 Scar conditions and fibrosis of skin: Secondary | ICD-10-CM | POA: Diagnosis not present

## 2015-07-09 DIAGNOSIS — Z7982 Long term (current) use of aspirin: Secondary | ICD-10-CM | POA: Diagnosis not present

## 2015-07-09 DIAGNOSIS — R208 Other disturbances of skin sensation: Secondary | ICD-10-CM | POA: Diagnosis not present

## 2015-07-09 DIAGNOSIS — E1122 Type 2 diabetes mellitus with diabetic chronic kidney disease: Secondary | ICD-10-CM | POA: Diagnosis not present

## 2015-07-09 DIAGNOSIS — S81809A Unspecified open wound, unspecified lower leg, initial encounter: Secondary | ICD-10-CM | POA: Diagnosis not present

## 2015-07-09 DIAGNOSIS — E1129 Type 2 diabetes mellitus with other diabetic kidney complication: Secondary | ICD-10-CM | POA: Diagnosis not present

## 2015-07-09 DIAGNOSIS — E1142 Type 2 diabetes mellitus with diabetic polyneuropathy: Secondary | ICD-10-CM | POA: Diagnosis not present

## 2015-07-09 DIAGNOSIS — N186 End stage renal disease: Secondary | ICD-10-CM | POA: Diagnosis not present

## 2015-07-09 DIAGNOSIS — Z992 Dependence on renal dialysis: Secondary | ICD-10-CM | POA: Diagnosis not present

## 2015-07-09 DIAGNOSIS — R600 Localized edema: Secondary | ICD-10-CM | POA: Diagnosis not present

## 2015-07-09 DIAGNOSIS — I7389 Other specified peripheral vascular diseases: Secondary | ICD-10-CM | POA: Diagnosis not present

## 2015-07-09 DIAGNOSIS — N2581 Secondary hyperparathyroidism of renal origin: Secondary | ICD-10-CM | POA: Diagnosis not present

## 2015-07-09 DIAGNOSIS — D631 Anemia in chronic kidney disease: Secondary | ICD-10-CM | POA: Diagnosis not present

## 2015-07-09 DIAGNOSIS — Z8673 Personal history of transient ischemic attack (TIA), and cerebral infarction without residual deficits: Secondary | ICD-10-CM | POA: Diagnosis not present

## 2015-07-09 DIAGNOSIS — G8918 Other acute postprocedural pain: Secondary | ICD-10-CM | POA: Diagnosis not present

## 2015-07-09 DIAGNOSIS — S81802A Unspecified open wound, left lower leg, initial encounter: Secondary | ICD-10-CM | POA: Diagnosis not present

## 2015-07-09 DIAGNOSIS — Z89421 Acquired absence of other right toe(s): Secondary | ICD-10-CM | POA: Diagnosis not present

## 2015-07-09 DIAGNOSIS — Z4781 Encounter for orthopedic aftercare following surgical amputation: Secondary | ICD-10-CM | POA: Diagnosis not present

## 2015-07-09 DIAGNOSIS — E869 Volume depletion, unspecified: Secondary | ICD-10-CM | POA: Diagnosis not present

## 2015-07-09 DIAGNOSIS — M6281 Muscle weakness (generalized): Secondary | ICD-10-CM | POA: Diagnosis not present

## 2015-07-09 DIAGNOSIS — R5381 Other malaise: Secondary | ICD-10-CM | POA: Diagnosis not present

## 2015-07-09 DIAGNOSIS — E119 Type 2 diabetes mellitus without complications: Secondary | ICD-10-CM | POA: Diagnosis not present

## 2015-07-09 DIAGNOSIS — I120 Hypertensive chronic kidney disease with stage 5 chronic kidney disease or end stage renal disease: Secondary | ICD-10-CM | POA: Diagnosis not present

## 2015-07-09 DIAGNOSIS — J45909 Unspecified asthma, uncomplicated: Secondary | ICD-10-CM | POA: Diagnosis not present

## 2015-07-09 DIAGNOSIS — M109 Gout, unspecified: Secondary | ICD-10-CM | POA: Diagnosis not present

## 2015-07-09 DIAGNOSIS — I739 Peripheral vascular disease, unspecified: Secondary | ICD-10-CM | POA: Diagnosis not present

## 2015-07-09 DIAGNOSIS — Z89422 Acquired absence of other left toe(s): Secondary | ICD-10-CM | POA: Diagnosis not present

## 2015-07-09 DIAGNOSIS — R269 Unspecified abnormalities of gait and mobility: Secondary | ICD-10-CM | POA: Diagnosis not present

## 2015-07-09 DIAGNOSIS — R279 Unspecified lack of coordination: Secondary | ICD-10-CM | POA: Diagnosis not present

## 2015-07-09 DIAGNOSIS — Z981 Arthrodesis status: Secondary | ICD-10-CM | POA: Diagnosis not present

## 2015-07-09 DIAGNOSIS — S88112S Complete traumatic amputation at level between knee and ankle, left lower leg, sequela: Secondary | ICD-10-CM | POA: Diagnosis not present

## 2015-07-09 DIAGNOSIS — D62 Acute posthemorrhagic anemia: Secondary | ICD-10-CM | POA: Diagnosis not present

## 2015-07-09 DIAGNOSIS — M25562 Pain in left knee: Secondary | ICD-10-CM | POA: Diagnosis not present

## 2015-07-09 DIAGNOSIS — Z79899 Other long term (current) drug therapy: Secondary | ICD-10-CM | POA: Diagnosis not present

## 2015-07-09 DIAGNOSIS — E118 Type 2 diabetes mellitus with unspecified complications: Secondary | ICD-10-CM | POA: Diagnosis not present

## 2015-07-09 DIAGNOSIS — D638 Anemia in other chronic diseases classified elsewhere: Secondary | ICD-10-CM | POA: Diagnosis not present

## 2015-07-09 DIAGNOSIS — M549 Dorsalgia, unspecified: Secondary | ICD-10-CM | POA: Diagnosis not present

## 2015-07-09 DIAGNOSIS — J449 Chronic obstructive pulmonary disease, unspecified: Secondary | ICD-10-CM | POA: Diagnosis not present

## 2015-07-09 DIAGNOSIS — K5900 Constipation, unspecified: Secondary | ICD-10-CM | POA: Diagnosis not present

## 2015-07-09 DIAGNOSIS — I70248 Atherosclerosis of native arteries of left leg with ulceration of other part of lower left leg: Secondary | ICD-10-CM | POA: Diagnosis not present

## 2015-07-09 DIAGNOSIS — S88112D Complete traumatic amputation at level between knee and ankle, left lower leg, subsequent encounter: Secondary | ICD-10-CM | POA: Diagnosis not present

## 2015-07-09 DIAGNOSIS — R197 Diarrhea, unspecified: Secondary | ICD-10-CM | POA: Diagnosis not present

## 2015-07-09 DIAGNOSIS — Z89512 Acquired absence of left leg below knee: Secondary | ICD-10-CM | POA: Diagnosis not present

## 2015-07-09 DIAGNOSIS — G8929 Other chronic pain: Secondary | ICD-10-CM | POA: Diagnosis not present

## 2015-07-09 DIAGNOSIS — E785 Hyperlipidemia, unspecified: Secondary | ICD-10-CM | POA: Diagnosis not present

## 2015-07-09 DIAGNOSIS — E8889 Other specified metabolic disorders: Secondary | ICD-10-CM | POA: Diagnosis not present

## 2015-07-10 DIAGNOSIS — N186 End stage renal disease: Secondary | ICD-10-CM | POA: Diagnosis not present

## 2015-07-10 DIAGNOSIS — E1129 Type 2 diabetes mellitus with other diabetic kidney complication: Secondary | ICD-10-CM | POA: Diagnosis not present

## 2015-07-10 DIAGNOSIS — Z992 Dependence on renal dialysis: Secondary | ICD-10-CM | POA: Diagnosis not present

## 2015-07-16 ENCOUNTER — Inpatient Hospital Stay (HOSPITAL_COMMUNITY)
Admission: RE | Admit: 2015-07-16 | Discharge: 2015-07-24 | DRG: 559 | Disposition: A | Discharge status: Home Health | Payer: Medicare Other | Source: Intra-hospital | Attending: Physical Medicine & Rehabilitation | Admitting: Physical Medicine & Rehabilitation

## 2015-07-16 ENCOUNTER — Encounter: Payer: Self-pay | Admitting: *Deleted

## 2015-07-16 ENCOUNTER — Other Ambulatory Visit: Payer: Self-pay | Admitting: Physical Medicine and Rehabilitation

## 2015-07-16 ENCOUNTER — Encounter: Payer: Self-pay | Admitting: Physical Medicine and Rehabilitation

## 2015-07-16 ENCOUNTER — Inpatient Hospital Stay (HOSPITAL_COMMUNITY): Payer: Medicare Other

## 2015-07-16 DIAGNOSIS — Z992 Dependence on renal dialysis: Secondary | ICD-10-CM | POA: Diagnosis not present

## 2015-07-16 DIAGNOSIS — M109 Gout, unspecified: Secondary | ICD-10-CM

## 2015-07-16 DIAGNOSIS — I6789 Other cerebrovascular disease: Secondary | ICD-10-CM | POA: Diagnosis not present

## 2015-07-16 DIAGNOSIS — Z981 Arthrodesis status: Secondary | ICD-10-CM | POA: Diagnosis not present

## 2015-07-16 DIAGNOSIS — E1142 Type 2 diabetes mellitus with diabetic polyneuropathy: Secondary | ICD-10-CM | POA: Diagnosis not present

## 2015-07-16 DIAGNOSIS — Z8673 Personal history of transient ischemic attack (TIA), and cerebral infarction without residual deficits: Secondary | ICD-10-CM

## 2015-07-16 DIAGNOSIS — E1122 Type 2 diabetes mellitus with diabetic chronic kidney disease: Secondary | ICD-10-CM

## 2015-07-16 DIAGNOSIS — K5900 Constipation, unspecified: Secondary | ICD-10-CM

## 2015-07-16 DIAGNOSIS — E8889 Other specified metabolic disorders: Secondary | ICD-10-CM | POA: Diagnosis not present

## 2015-07-16 DIAGNOSIS — R269 Unspecified abnormalities of gait and mobility: Secondary | ICD-10-CM | POA: Diagnosis not present

## 2015-07-16 DIAGNOSIS — I120 Hypertensive chronic kidney disease with stage 5 chronic kidney disease or end stage renal disease: Secondary | ICD-10-CM

## 2015-07-16 DIAGNOSIS — Z4781 Encounter for orthopedic aftercare following surgical amputation: Principal | ICD-10-CM

## 2015-07-16 DIAGNOSIS — E785 Hyperlipidemia, unspecified: Secondary | ICD-10-CM | POA: Diagnosis not present

## 2015-07-16 DIAGNOSIS — N2581 Secondary hyperparathyroidism of renal origin: Secondary | ICD-10-CM | POA: Diagnosis not present

## 2015-07-16 DIAGNOSIS — R208 Other disturbances of skin sensation: Secondary | ICD-10-CM | POA: Insufficient documentation

## 2015-07-16 DIAGNOSIS — E118 Type 2 diabetes mellitus with unspecified complications: Secondary | ICD-10-CM | POA: Diagnosis not present

## 2015-07-16 DIAGNOSIS — Z89512 Acquired absence of left leg below knee: Secondary | ICD-10-CM | POA: Diagnosis not present

## 2015-07-16 DIAGNOSIS — E869 Volume depletion, unspecified: Secondary | ICD-10-CM | POA: Diagnosis not present

## 2015-07-16 DIAGNOSIS — M549 Dorsalgia, unspecified: Secondary | ICD-10-CM | POA: Diagnosis not present

## 2015-07-16 DIAGNOSIS — S88112D Complete traumatic amputation at level between knee and ankle, left lower leg, subsequent encounter: Secondary | ICD-10-CM

## 2015-07-16 DIAGNOSIS — D62 Acute posthemorrhagic anemia: Secondary | ICD-10-CM | POA: Insufficient documentation

## 2015-07-16 DIAGNOSIS — G8918 Other acute postprocedural pain: Secondary | ICD-10-CM | POA: Diagnosis not present

## 2015-07-16 DIAGNOSIS — S98929A Partial traumatic amputation of unspecified foot, level unspecified, initial encounter: Secondary | ICD-10-CM | POA: Diagnosis not present

## 2015-07-16 DIAGNOSIS — R52 Pain, unspecified: Secondary | ICD-10-CM

## 2015-07-16 DIAGNOSIS — Z7902 Long term (current) use of antithrombotics/antiplatelets: Secondary | ICD-10-CM

## 2015-07-16 DIAGNOSIS — Z79899 Other long term (current) drug therapy: Secondary | ICD-10-CM

## 2015-07-16 DIAGNOSIS — Z89432 Acquired absence of left foot: Secondary | ICD-10-CM | POA: Diagnosis not present

## 2015-07-16 DIAGNOSIS — R197 Diarrhea, unspecified: Secondary | ICD-10-CM

## 2015-07-16 DIAGNOSIS — Z7982 Long term (current) use of aspirin: Secondary | ICD-10-CM

## 2015-07-16 DIAGNOSIS — S88112S Complete traumatic amputation at level between knee and ankle, left lower leg, sequela: Secondary | ICD-10-CM

## 2015-07-16 DIAGNOSIS — N186 End stage renal disease: Secondary | ICD-10-CM

## 2015-07-16 DIAGNOSIS — D638 Anemia in other chronic diseases classified elsewhere: Secondary | ICD-10-CM

## 2015-07-16 DIAGNOSIS — J449 Chronic obstructive pulmonary disease, unspecified: Secondary | ICD-10-CM | POA: Diagnosis not present

## 2015-07-16 DIAGNOSIS — M25562 Pain in left knee: Secondary | ICD-10-CM | POA: Diagnosis not present

## 2015-07-16 DIAGNOSIS — R2689 Other abnormalities of gait and mobility: Secondary | ICD-10-CM | POA: Diagnosis not present

## 2015-07-16 DIAGNOSIS — F17210 Nicotine dependence, cigarettes, uncomplicated: Secondary | ICD-10-CM | POA: Diagnosis not present

## 2015-07-16 DIAGNOSIS — S88119A Complete traumatic amputation at level between knee and ankle, unspecified lower leg, initial encounter: Secondary | ICD-10-CM | POA: Diagnosis present

## 2015-07-16 DIAGNOSIS — Z794 Long term (current) use of insulin: Secondary | ICD-10-CM | POA: Diagnosis not present

## 2015-07-16 DIAGNOSIS — G8929 Other chronic pain: Secondary | ICD-10-CM | POA: Diagnosis not present

## 2015-07-16 DIAGNOSIS — I739 Peripheral vascular disease, unspecified: Secondary | ICD-10-CM | POA: Diagnosis not present

## 2015-07-16 LAB — GLUCOSE, CAPILLARY
GLUCOSE-CAPILLARY: 93 mg/dL (ref 65–99)
GLUCOSE-CAPILLARY: 105 mg/dL — AB (ref 65–99)

## 2015-07-16 MED ORDER — ALUMINUM HYDROXIDE GEL 320 MG/5ML PO SUSP
10.0000 mL | ORAL | Status: DC | PRN
  Filled 2015-07-16: qty 30

## 2015-07-16 MED ORDER — DIPHENHYDRAMINE HCL 12.5 MG/5ML PO ELIX: 12.5000 mg | ORAL_SOLUTION | Freq: Four times a day (QID) | ORAL | Status: DC | PRN

## 2015-07-16 MED ORDER — PROMETHAZINE HCL 12.5 MG PO TABS: 12.5000 mg | ORAL_TABLET | Freq: Four times a day (QID) | ORAL | Status: DC | PRN

## 2015-07-16 MED ORDER — LANTHANUM CARBONATE 500 MG PO CHEW
1000.0000 mg | CHEWABLE_TABLET | Freq: Three times a day (TID) | ORAL | Status: DC
  Administered 2015-07-17 – 2015-07-24 (×12): 1000 mg via ORAL
  Filled 2015-07-16 (×27): qty 2

## 2015-07-16 MED ORDER — CLOPIDOGREL BISULFATE 75 MG PO TABS
75.0000 mg | ORAL_TABLET | Freq: Every day | ORAL | Status: DC
Start: 2015-07-17 — End: 2015-07-24
  Administered 2015-07-17 – 2015-07-24 (×8): 75 mg via ORAL
  Filled 2015-07-16 (×8): qty 1

## 2015-07-16 MED ORDER — INSULIN GLARGINE 100 UNIT/ML ~~LOC~~ SOLN
5.0000 [IU] | Freq: Every day | SUBCUTANEOUS | Status: DC
  Administered 2015-07-17 – 2015-07-24 (×7): 5 [IU] via SUBCUTANEOUS
  Filled 2015-07-16 (×9): qty 0.05

## 2015-07-16 MED ORDER — DULOXETINE HCL 20 MG PO CPEP
20.0000 mg | ORAL_CAPSULE | Freq: Every day | ORAL | Status: DC
  Administered 2015-07-17 – 2015-07-24 (×8): 20 mg via ORAL
  Filled 2015-07-16 (×11): qty 1

## 2015-07-16 MED ORDER — METHOCARBAMOL 500 MG PO TABS
500.0000 mg | ORAL_TABLET | Freq: Four times a day (QID) | ORAL | Status: DC | PRN
  Administered 2015-07-17 – 2015-07-19 (×4): 500 mg via ORAL
  Filled 2015-07-16 (×4): qty 1

## 2015-07-16 MED ORDER — INSULIN ASPART 100 UNIT/ML ~~LOC~~ SOLN: 0.0000 [IU] | Freq: Every day | SUBCUTANEOUS | Status: DC

## 2015-07-16 MED ORDER — OXYCODONE-ACETAMINOPHEN 5-325 MG PO TABS
1.0000 | ORAL_TABLET | ORAL | Status: DC | PRN
  Administered 2015-07-16: 1 via ORAL
  Administered 2015-07-17 – 2015-07-22 (×21): 2 via ORAL
  Filled 2015-07-16 (×2): qty 2
  Filled 2015-07-16: qty 1
  Filled 2015-07-16 (×17): qty 2

## 2015-07-16 MED ORDER — ASPIRIN EC 81 MG PO TBEC
81.0000 mg | DELAYED_RELEASE_TABLET | Freq: Every day | ORAL | Status: DC
  Administered 2015-07-17 – 2015-07-24 (×8): 81 mg via ORAL
  Filled 2015-07-16 (×8): qty 1

## 2015-07-16 MED ORDER — ENOXAPARIN SODIUM 30 MG/0.3ML ~~LOC~~ SOLN
30.0000 mg | SUBCUTANEOUS | Status: DC
  Administered 2015-07-16 – 2015-07-20 (×4): 30 mg via SUBCUTANEOUS
  Filled 2015-07-16 (×4): qty 0.3

## 2015-07-16 MED ORDER — OXYCODONE-ACETAMINOPHEN 5-325 MG PO TABS: 1.0000 | ORAL_TABLET | ORAL | Status: DC | PRN

## 2015-07-16 MED ORDER — INSULIN ASPART 100 UNIT/ML ~~LOC~~ SOLN
0.0000 [IU] | Freq: Three times a day (TID) | SUBCUTANEOUS | Status: DC
  Administered 2015-07-18: 1 [IU] via SUBCUTANEOUS
  Administered 2015-07-22: 2 [IU] via SUBCUTANEOUS

## 2015-07-16 MED ORDER — HYDROCERIN EX CREA
TOPICAL_CREAM | Freq: Two times a day (BID) | CUTANEOUS | Status: DC
  Administered 2015-07-16 – 2015-07-22 (×12): via TOPICAL
  Filled 2015-07-16 (×2): qty 113

## 2015-07-16 MED ORDER — PROMETHAZINE HCL 12.5 MG RE SUPP: 12.5000 mg | Freq: Four times a day (QID) | RECTAL | Status: DC | PRN

## 2015-07-16 MED ORDER — PROMETHAZINE HCL 25 MG/ML IJ SOLN: 12.5000 mg | Freq: Four times a day (QID) | INTRAMUSCULAR | Status: DC | PRN

## 2015-07-16 MED ORDER — SORBITOL 70 % SOLN: 30.0000 mL | Freq: Two times a day (BID) | Status: DC | PRN

## 2015-07-16 MED ORDER — FENTANYL 25 MCG/HR TD PT72
25.0000 ug | MEDICATED_PATCH | TRANSDERMAL | Status: DC
  Administered 2015-07-16 – 2015-07-22 (×3): 25 ug via TRANSDERMAL
  Filled 2015-07-16 (×3): qty 1

## 2015-07-16 MED ORDER — NEPRO/CARBSTEADY PO LIQD
237.0000 mL | Freq: Two times a day (BID) | ORAL | Status: DC
  Administered 2015-07-17 – 2015-07-24 (×12): 237 mL via ORAL

## 2015-07-16 MED ORDER — TRAZODONE HCL 50 MG PO TABS: 25.0000 mg | ORAL_TABLET | Freq: Every evening | ORAL | Status: DC | PRN

## 2015-07-16 MED ORDER — BISACODYL 10 MG RE SUPP: 10.0000 mg | Freq: Every day | RECTAL | Status: DC | PRN

## 2015-07-16 MED ORDER — GUAIFENESIN-DM 100-10 MG/5ML PO SYRP: 5.0000 mL | ORAL_SOLUTION | Freq: Four times a day (QID) | ORAL | Status: DC | PRN

## 2015-07-16 MED ORDER — RENA-VITE PO TABS
1.0000 | ORAL_TABLET | Freq: Every day | ORAL | Status: DC
  Administered 2015-07-16 – 2015-07-23 (×8): 1 via ORAL
  Filled 2015-07-16 (×8): qty 1

## 2015-07-16 MED ORDER — ATORVASTATIN CALCIUM 40 MG PO TABS
40.0000 mg | ORAL_TABLET | Freq: Every day | ORAL | Status: DC
  Administered 2015-07-16 – 2015-07-23 (×6): 40 mg via ORAL
  Filled 2015-07-16 (×6): qty 1

## 2015-07-16 NOTE — H&P (Addendum)
Physical Medicine and Rehabilitation Admission H&P    CC:  L-BKA due to critical limb ischemia and non-healing TMA wound.    HPI: John [name] is a 60 year old male with history of COPD, chronic pian, ESRD, DM type 2 with peripheral neuropathy, SCI with ACDF, PAD with ongoing tobacco use s/p left transmetatarsal amputation due to refusal of BKA and SFA atherectomy. He had poor wound healing with necrosis, rest pain and edema and was agreeable to undergo L-BKA on  07/09/15 by Dr.  Donnelly Angelica at [place]. Plavix resumed post op and left knee brace ordered to help keep LLE in extension due to ongoing issues with spasms. Therapy initiated with education ongoing on desensitization to help with neuropathic pain.  Fentanyl patch added to help with pain control and cymbalta added to help manage neuropathic pain. He continues to have difficulty completing ADL tasks as well as problems with mobility and CIR was recommended for follow up therapy.  Review of Systems  Constitutional: Positive for malaise/fatigue.  Eyes: Negative for blurred vision and double vision.       Wears left prosthesis.   Respiratory: Negative for cough and shortness of breath.   Cardiovascular: Negative for chest pain and palpitations.  Gastrointestinal: Positive for diarrhea. Negative for heartburn, nausea and abdominal pain.  Musculoskeletal: Positive for myalgias and back pain. Negative for falls.  Neurological: Positive for weakness. Negative for dizziness and headaches.  Psychiatric/Behavioral: The patient does not have insomnia.   All other systems reviewed and are negative.     Past Medical History  Diagnosis Date  . COPD (chronic obstructive pulmonary disease) ([place])   . Chronic back pain     Chronic narcotics for this for many years  . Diabetes mellitus   . Hyperlipidemia   . Hypertension   . Foot pain   . Peripheral vascular disease ([place])   . Asthma   . Stroke ([place])     No residual "Mini  strokes"  . Ulcer 1985  . Fracture of foot     [place]- Right  . Neuromuscular disorder ([place])     periferal neuropathy  . Chronic kidney disease     CKD stage III    Past Surgical History  Procedure Laterality Date  . Neck surgery    . Eye surgery    . Cardiac catheterization    . Endarterectomy  12/18/2011    Procedure: ENDARTERECTOMY CAROTID;  Surgeon: Angelia Mould, MD;  Location: [facility];  Service: Vascular;  Laterality: Left;  . Carotid endarterectomy    . Axillary-femoral bypass graft  06/18/2012    Procedure: BYPASS GRAFT AXILLA-BIFEMORAL;  Surgeon: Angelia Mould, MD;  Location: [facility];  Service: Vascular;  Laterality: Right;  Right Axillo to Right Femoral Bypass Graft  . Femoral-popliteal bypass graft  06/18/2012    Procedure: BYPASS GRAFT FEMORAL-POPLITEAL ARTERY;  Surgeon: Angelia Mould, MD;  Location: [facility] OR;  Service: Vascular;  Laterality: Right;  Right Femoral to Above Knee Popliteal Bypass Graft  . Amputation  06/18/2012    Procedure: AMPUTATION DIGIT;  Surgeon: Angelia Mould, MD;  [facility] OR;  Service: Vascular;  Laterality: Right;  Right side, amputation of fourth and fifth toes  . Av fistula placement  06/24/2012    Procedure: ARTERIOVENOUS (AV) FISTULA CREATION;  Surgeon: Angelia Mould, MD;  Location: [facility];  Service: Vascular;  Laterality: Left;  . Lower extremity angiogram Right 11/21/2011    Procedure: LOWER EXTREMITY  ANGIOGRAM;  Surgeon: Elam Dutch, MD;  Location: [facility] CATH LAB;  Service: Cardiovascular;  Laterality: Right;    Family History  Problem Relation Age of Onset  . Diabetes Mother   . Heart disease Mother   . Hypertension Mother   . Heart attack Mother   . Diabetes Father     Amputation  . Heart disease Father     Before age 27  . Hypertension Father   . Heart attack Father   . Other Father     bleeding problems  . Diabetes Brother   . Heart disease Brother     Before age 34  . Hypertension Brother    . Heart attack Brother     Social History:  Married.  Disabled cook/truck driver. Independent with cane/wheelchair PTA. Reports that wife does not work.  Per reports that he has been smoking 4-5  Cigarettes daily.  He has a 10 pack-year smoking history. He has never used smokeless tobacco. He reports that he does not drink alcohol or use illicit drugs.    Allergies  Allergen Reactions  . Nicoderm [Nicotine] Itching and Rash  . Gabapentin Other (See Comments)    Can't walk, shuts legs down  . Vicodin [Hydrocodone-Acetaminophen] Hives and Itching    (Not in a hospital admission)  Home: One level home with level entry.    Functional History: Independent with ADLs PTA.  Was using wheelchair most of time due to pain and stayed in bed most of the day.  Independent with cane and could stand for 1-2 minutes due to pain.   Functional Status:  Mobility: Min assist for bed mobility with HOB > 30 degrees Min assist +2 for transfers    ADL: Moderate assist with LB bathing  Min assist upper body dressing and grooming.  Max assist lower body dressing.   Cognition: Follows commands without difficulty.     There were no vitals taken for this visit. Physical Exam  Nursing note and vitals reviewed. Constitutional: He is oriented to person, place, and time. He appears well-developed and well-nourished.  HENT:  Head: Atraumatic.  Mouth/Throat: Oropharyngeal exudate present.  Poor dentition  Eyes: Lids are normal. Right conjunctiva is injected. Left eye exhibits abnormal extraocular motion. Left pupil is not reactive.  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   Neck: Normal range of motion. Neck supple.  Cardiovascular: Normal rate and regular rhythm.   Murmur heard. Respiratory: Effort normal and breath sounds normal. No respiratory distress. He exhibits no tenderness.  GI: Soft. Bowel sounds are normal. He exhibits no distension. There is no tenderness. There is no guarding.    Musculoskeletal: He exhibits edema (L-BKA with moderate edema and sutures intact.  Keeps residual limb flexed  with inability to fully extend.).  Fingers b/l atrophic and ischemic appearing on left.  Left 5th finger with dry gangrenous? ulcer.   Left knee with large ischemic area on patella that's tender to touch.  Knee with arthritic changes and tight hamstrings.  Multiple abraded areas on left lateral knee--one with denuded area.  Right heel dark appearing.  Neurological: He is alert and oriented to person, place, and time.  Right exopthalmic.  Tangential needing redirection.  He was able to follow simple motor commands without difficulty.   Motor: b/l RUE: 5/5 LUE 4+/5 LLE: hip flexion 4/5 RLE: 4+/5 proximal to distal  Skin: Skin is warm and dry. He is not diaphoretic.  Ischemic changes as noted  Psychiatric: His speech is tangential. He is  inattentive.    Recent Labs: 07/14/2015  Na- 133  K-4.1  CL-97 Cr- 8.41  BUN- 56 WBC- 9.9   Hgb- 9.8  Hct- 31.0  Plt- 153  Medical Problem List and Plan: 1.  Abnormality of gait, post-operative pain secondary to left BKA. 2.  DVT Prophylaxis/Anticoagulation: Pharmaceutical: Lovenox 3. Pain Management: Fentanyl titrated to 25 mcg/ 72 hrs on 02/04--Continue fentanyl patch with prn oxycodone.  4. Mood: Team to provide ego support. LCSW to follow for evaluation and support.  5. Neuropsych: This patient is capable of making decisions on his own behalf. 6. Skin/Wound Care: Monitor wound daily.  7. Fluids/Electrolytes/Nutrition: Strict I/O. 1200 cc FR/daily 8. PAD: Continue ASA/plavix. 9. ESRD: On HD T, Th, Sa-schedule procedure after therapy session to help with activity tolerance during the day. Daily weights. Strict I/O.  10 DM type 2: Monitor BS with ac/hs checks. Continue SSI for tighter BS control. Continue to hold lantus.  11. Left Knee pain: will check Xrays due to warmth and pain. Question gout.  12. Diarrhea: discontinue senna. Will  monitor for now and check C diff if recurs.   Post Admission Physician Evaluation: 1. Functional deficits secondary  to left BKA. 2. Patient is admitted to receive collaborative, interdisciplinary care between the physiatrist, rehab nursing staff, and therapy team. 3. Patient's level of medical complexity and substantial therapy needs in context of that medical necessity cannot be provided at a lesser intensity of care such as a SNF. 4. Patient has experienced substantial functional loss from his/her baseline which was documented above under the "Functional History" and "Functional Status" headings.  Judging by the patient's diagnosis, physical exam, and functional history, the patient has potential for functional progress which will result in measurable gains while on inpatient rehab.  These gains will be of substantial and practical use upon discharge  in facilitating mobility and self-care at the household level. 5. Physiatrist will provide 24 hour management of medical needs as well as oversight of the therapy plan/treatment and provide guidance as appropriate regarding the interaction of the two. 6. 24 hour rehab nursing will assist with safety, skin/wound care, disease management, pain management and patient education and help integrate therapy concepts, techniques,education, etc. 7. PT will assess and treat for/with: Lower extremity strength, range of motion, stamina, balance, functional mobility, safety, adaptive techniques and equipment, woundcare, coping skills, pain control, pre-prosthetic education.   Goals are: Mod I/Supervision. 8. OT will assess and treat for/with: ADL's, functional mobility, safety, upper extremity strength, adaptive techniques and equipment, wound mgt, ego support, and community reintegration.   Goals are: Mod I/Ind. Therapy may proceed with showering this patient. 9. Case Management and Social Worker will assess and treat for psychological issues and discharge  planning. 10. Team conference will be held weekly to assess progress toward goals and to determine barriers to discharge. 11. Patient will receive at least 3 hours of therapy per day at least 5 days per week. 12. ELOS: 7-10 days.        13. Prognosis:  good  Delice Lesch, MD 07/16/2015

## 2015-07-16 NOTE — Progress Notes (Signed)
Pt arrived on unit, made comfortable in bed. Oriented to unit, advised to call for assistance, verbalized understanding. Wife notified to bring clothes tomorrow for therapy.

## 2015-07-16 NOTE — Interval H&P Note (Signed)
John [name] was admitted today to Inpatient Rehabilitation with the diagnosis of left BKA.  The patient's history has been reviewed, patient examined, and there is no change in status.  Patient continues to be appropriate for intensive inpatient rehabilitation.  I have reviewed the patient's chart and labs.  Questions were answered to the patient's satisfaction. The PAPE has been reviewed and assessment remains appropriate.  Thad Osoria Lorie Phenix 07/16/2015, 9:26 PM

## 2015-07-16 NOTE — Progress Notes (Signed)
[facility] PMR Admission Coordinator Pre-Admission Assessment  Patient: John [name] is an 60 y.o., male MRN: [id number] DOB: [birth year] Height:   Weight:    Insurance Information HMO: yes PPO: PCP: IPA: 80/20: OTHER: Medicare advantage plan PRIMARY: [employer] Medicare Policy#: [id number] Subscriber: pt CM Name: Orvan July Phone#: [phone] Fax#: [phone] Pre-Cert#: [id number] . Approved for 7 days. F/u CM is Earney Hamburg at phone [phone] EMR access Employer: disabled Benefits: Phone #: [phone] Name: 07/16/15 Eff. Date: 06/10/15 Deduct: none Out of Pocket Max: $4900 Life Max: none CIR: $345 per day days 1-5 then covers 100% SNF: no copay days 1-20; $160 per day days 21-51; no copay days 52-100 Outpatient: $40 copay per visit Co-Pay: no visit limit Home Health: 100% Co-Pay: no visit limit DME: 80% Co-Pay: 20% Providers: in network  SECONDARY: Medicaid [place] Access Policy#: [id number] L Subscriber: pt Benefits: Phone #: [phone] Name: 07/16/15 MADMN eligible [id number]  Medicaid Application Date: Case Manager:  Disability Application Date: Case Worker:   Emergency Contact Information Contact Information    Name Relation Home Work Mobile   Hanahan,Fran Spouse [phone]  [phone]      Current Medical History  Patient Admitting Diagnosis: L BKA  History of Present Illness: John [name] is a 60 year old male with history of COPD, chronic pain, ESRD, DM type 2 with peripheral neuropathy, PAD with ongoing tobacco use s/p left transmetatarsal amputation due to refusal of BKA and SFA atherectomy. He had poor wound healing with necrosis, rest pain and edema and was agreeable to undergo L-BKA on 07/09/15 at [place] .  Plavix resumed post op and left knee brace ordered to help keep LLE in extension due to ongoing issues with  spasms. Therapy initiated with education ongoing on desensitization to help with neuropathic pain. Fentanyl patch added to help with pain control and cymbalta added to help manage neuropathic pain. He continues to have difficulty completing ADL tasks as well as problems with mobility and CIR was recommended for follow up therapy.  Patient's medical record from [facility] in [place], [place] has been reviewed by the rehabilitation admission coordinator and physician. NIH Stroke scale: Glascow Coma Scale:  Past Medical History  Past Medical History  Diagnosis Date  . COPD (chronic obstructive pulmonary disease) ([place])   . Chronic back pain     Chronic narcotics for this for many years  . Diabetes mellitus   . Hyperlipidemia   . Hypertension   . Foot pain   . Peripheral vascular disease ([place])   . Asthma   . Stroke ([place])     No residual "Mini strokes"  . Ulcer 1985  . Fracture of foot     [place]- Right  . Neuromuscular disorder ([place])     periferal neuropathy  . Chronic kidney disease     CKD stage III    Family History  family history includes Diabetes in his brother, father, and mother; Heart attack in his brother, father, and mother; Heart disease in his brother, father, and mother; Hypertension in his brother, father, and mother; Other in his father.  Prior Rehab/Hospitalizations Has the patient had major surgery during 100 days prior to admission? Yes Went to [facility] 2014 for previous rehab. Otherwise has had Home Health Therapy  Current Medications Percocet ASA Plavix Lipitor Lantus Insulin Humalog Insulin Fosrenol Renovite   Patients Current Diet: Renal ADA diet with fluid restrictions  Precautions / Restrictions Precautions Precautions: Fall Restrictions Weight Bearing Restrictions: No  Has the patient had 2 or more falls or a fall with injury in the past year?No  Prior  Activity Level Limited Community (1-2x/wk): goes to outpt dialyis T, TH, Sat . Has been transfers to wheelchair limited due to TMA issues and wound care issues for a few months. A lot of time spent in bed for LE elevation as instructed  Prior Functional Level Self Care: Did the patient need help bathing, dressing, using the toilet or eating? Needed some help. Patient sponge bathed at sink in bathroom rather than use tub or shower  Indoor Mobility: Did the patient need assistance with walking from room to room (with or without device)? Needed some help. Pt was wheelchair Mod I transfers due to TMA limitations  Stairs: Did the patient need assistance with internal or external stairs (with or without device)? Needed some help. Used back door entrance in wheelchair with small ramp for 2 inch step up into home.   Functional Cognition: Did the patient need help planning regular tasks such as shopping or remembering to take medications? Independent  Home Equities trader / Equipment Home Assistive Devices/Equipment: Wheelchair, Environmental consultant (specify type)  Prior Device Use: Indicate devices/aids used by the patient prior to current illness, exacerbation or injury? Manual wheelchair   Prior Functional Level Current Functional Level  Bed Mobility  Independent  Min assist. Sitting balance at EOB cga with cues. Sitting up in recliner a lot.    Transfers  Mod Independent  Min assist (would req mod/max assist with getting pants over hips). Pt unable to hold body weight for either pushups from arm rests or to attain fully upright position using RW. Required 2 person mod assist for transfers from recliner to EOB.    Mobility - Walk/Wheelchair   (bed to wheelchair only)  Mod assist (to chair only noted). Noted lean to the left and posteriorly.    Upper Body Dressing  Independent  Min assist. Severely flexed posture.    Lower Body Dressing  Independent  Mod assist. Would  require mod to max assist with getting pants over hips.    Grooming  Independent  Independent. Seated at EOB with cga/sba for sitting balance   Eating/Drinking  Independent  Independent   Toilet Transfer  Mod Independent  Mod assist. 2 person assist with BSC. Have not tried slideboard transfers   Bladder Continence   still voids/continent  continent   Bowel Management  continent  continent   Stair Climbing     Other   Communication  intact  independent/relies on wife for decisions   Memory  intact  intact   Cooking/Meal Prep  wife     Housework  n/a    Money Management  with assist of wife    Driving        Pt able to tolerate only 10 degrees of range at most due to pain. Movement was effortful and took max time. LLE knee brace. Much encouragement needed to avoid keeping knee flexed.   Special needs/care consideration Dialysis T, TH, Sat [facility] Skin stump incision/ knee flexed  Bowel mgmt: continent Bladder mgmt: continent Diabetic mgmt IDDM   Previous Home Environment Living Arrangements: Spouse/significant other Lives With: Spouse Available Help at Discharge: Family, Available 24 hours/day Type of Home: House Home Layout: One level Home Access: Level entry (level entry back of house with 2" step up with small ramp; f) Entrance Stairs-Rails: None Entrance Stairs-Number of Steps: 2 inch step up back of house with small ramp Bathroom  Shower/Tub: Tub/shower unit, Walk-in shower (pt sponge bathes at seat in bathroom) Bathroom Toilet: Standard Bathroom Accessibility: Yes How Accessible: Accessible via wheelchair, Accessible via walker [place]: Yes Type of Home Care Services: [place] (if known): unknown Additional Comments: pt has applied for PCS with [facility] per OP SW note  Discharge Living Setting Plans for Discharge Living Setting: Patient's  home, Lives with (comment) (spouse) Type of Home at Discharge: House Discharge Home Layout: One level Discharge Home Access: Level entry (2 inch step up with small ramp to back of house) Entrance Stairs-Rails: None Entrance Stairs-Number of Steps: back entry level with small ramp. Front of house 7-8 steps they do not use Discharge Bathroom Shower/Tub: Tub/shower unit, Walk-in shower (pt sponge bathes at seat in bathroom) Discharge Bathroom Toilet: Standard Discharge Bathroom Accessibility: Yes How Accessible: Accessible via wheelchair, Accessible via walker Does the patient have any problems obtaining your medications?: No Bathroom accessible by RW and wheelchair per wife  Social/Family/Support Systems Patient Roles: Spouse, Parent Contact Information: [name], wife Anticipated Caregiver: wife, Eugene Gavia, sister and children Anticipated Caregiver's Contact Information: [name] [phone] Ability/Limitations of Caregiver: wife COPD and O2 dependent Caregiver Availability: 24/7 Discharge Plan Discussed with Primary Caregiver: Yes Is Caregiver In Agreement with Plan?: Yes Does Caregiver/Family have Issues with Lodging/Transportation while Pt is in Rehab?: No Wife uses RW due to back issues as well as O2 dependent  Goals/Additional Needs Patient/Family Goal for Rehab: Mod I wheelchair level PT, mod I to supervision with OT Expected length of stay: ELOS 7 days Dietary Needs: Renal diet Special Service Needs: Grandson and sister have been transporting pt to OP dilaysis [place] T, TH, Sat Pt/Family Agrees to Admission and willing to participate: Yes Program Orientation Provided & Reviewed with Pt/Caregiver Including Roles & Responsibilities: Yes  Patient Condition: I have reviewed medical records from [facility], [place], [place], spoke with Social worker, Patient and wife by phone. Patient will benefit from ongoing PT, OT, rehabilitative Nursing and Rehabilitative Medicine from the coordinated  team approach offered by Acute Inpatient rehabilitation. He will receive 3 hours per day of therapy and nursing interventions to control pain, assist with bowel and bladder management and address BKA issues and education. He is currently mod to max assist with transfers and ADLS. We will admit pt to acute inpatient rehab today.  Preadmission Screen Completed By: Cleatrice Burke, 07/16/2015 12:42 PM ______________________________________________________________________  Discussed status with Dr. Posey Pronto on 07/16/15 at 1319 and received telephone approval for admission today.  Admission Coordinator: Cleatrice Burke, time [id number] Date 07/16/2015   Assessment/Plan: Diagnosis: Left BKA 1. Does the need for close, 24 hr/day Medical supervision in concert with the patient's rehab needs make it unreasonable for this patient to be served in a less intensive setting? Yes 2. Co-Morbidities requiring supervision/potential complications: COPD, chronic pain, ESRD, DM type 2 with peripheral neuropathy, PAD, tobacco abuse 3. Due to safety, skin/wound care, disease management, pain management and patient education, does the patient require 24 hr/day rehab nursing? Yes 4. Does the patient require coordinated care of a physician, rehab nurse, PT (1-2 hrs/day, 5 days/week) and OT (1-2 hrs/day, 5 days/week) to address physical and functional deficits in the context of the above medical diagnosis(es)? Yes Addressing deficits in the following areas: balance, endurance, locomotion, strength, transferring, dressing, toileting and psychosocial support 5. Can the patient actively participate in an intensive therapy program of at least 3 hrs of therapy 5 days a week? Yes 6. The  potential for patient to make measurable gains while on inpatient rehab is excellent 7. Anticipated functional outcomes upon discharge from inpatients are: modified independent and supervision PT, modified independent OT, n/a  SLP 8. Estimated rehab length of stay to reach the above functional goals is: 7-10 days. 9. Does the patient have adequate social supports to accommodate these discharge functional goals? Yes 10. Anticipated D/C setting: Home 11. Anticipated post D/C treatments: HH therapy and Home excercise program 12. Overall Rehab/Functional Prognosis: excellent    RECOMMENDATIONS: This patient's condition is appropriate for continued rehabilitative care in the following setting: CIR Patient has agreed to participate in recommended program. Yes Note that insurance prior authorization may be required for reimbursement for recommended care.  Cleatrice Burke 07/16/2015  Delice Lesch, MD 07/16/2015      Revision History     Date/Time User Provider Type Action   07/16/2015 1:50 PM Ankit Lorie Phenix, MD Physician Sign   07/16/2015 1:50 PM Ankit Lorie Phenix, MD Physician Sign   07/16/2015 1:20 PM Cristina Gong, RN Rehab Admission Coordinator Share   View Details Report

## 2015-07-16 NOTE — PMR Pre-admission (Signed)
[facility] PMR Admission Coordinator Pre-Admission Assessment  Patient: John [name] is an 60 y.o., male MRN: [id number] DOB: [birth year] Height:   Weight:    Insurance Information HMO: yes    PPO:      PCP:      IPA:      80/20:      OTHER: Medicare advantage plan PRIMARY: [employer] Medicare      Policy#: [id number]      Subscriber: pt CM Name: John [name]      Phone#: [phone]     Fax#: [phone] Pre-Cert#: [id number] . Approved for 7 days. F/u CM is John [name] at phone [phone] EMR access   Employer: disabled Benefits:  Phone #: [phone]   Name: 07/16/15 Eff. Date: 06/10/15     Deduct: none      Out of Pocket Max: $4900      Life Max: none CIR: $345 per day days 1-5 then covers 100%      SNF: no copay days 1-20; $160 per day days 21-51; no copay days 52-100 Outpatient: $40 copay per visit     Co-Pay: no visit limit Home Health: 100%      Co-Pay: no visit limit DME: 80%     Co-Pay: 20% Providers: in network  SECONDARY: Medicaid [place] Access      Policy#: [id number]      Subscriber: pt Benefits:  Phone #: [phone]     Name: 07/16/15 MADMN eligible [id number]  Medicaid Application Date:       Case Manager:  Disability Application Date:       Case Worker:   Emergency Contact Information Contact Information    Name Relation Home Work Mobile   John [name] Spouse [phone]  [phone]      Current Medical History  Patient Admitting Diagnosis:  L BKA  History of Present Illness: John [name] is a 60 year old male with history of COPD, chronic pain, ESRD, DM type 2 with peripheral neuropathy, PAD with ongoing tobacco use s/p left transmetatarsal amputation due to refusal of BKA and SFA atherectomy. He had poor wound healing with necrosis, rest pain and edema and was agreeable to undergo L-BKA on 07/09/15 at [place] .  Plavix resumed post op and left knee brace ordered to help keep LLE in extension due to ongoing issues with spasms. Therapy  initiated with education ongoing on desensitization to help with neuropathic pain. Fentanyl patch added to help with pain control and cymbalta added to help manage neuropathic pain. He continues to have difficulty completing ADL tasks as well as problems with mobility and CIR was recommended for follow up therapy.  Patient's medical record from [facility] in [place], [place] has been reviewed by the rehabilitation admission coordinator and physician. NIH Stroke scale: Glascow Coma Scale:  Past Medical History  Past Medical History  Diagnosis Date  . COPD (chronic obstructive pulmonary disease) ([place])   . Chronic back pain     Chronic narcotics for this for many years  . Diabetes mellitus   . Hyperlipidemia   . Hypertension   . Foot pain   . Peripheral vascular disease ([place])   . Asthma   . Stroke ([place])     No residual "Mini strokes"  . Ulcer 1985  . Fracture of foot     Compond- Right  . Neuromuscular disorder ([place])     periferal neuropathy  . Chronic kidney disease     CKD stage III  Family History   family history includes Diabetes in his brother, father, and mother; Heart attack in his brother, father, and mother; Heart disease in his brother, father, and mother; Hypertension in his brother, father, and mother; Other in his father.  Prior Rehab/Hospitalizations Has the patient had major surgery during 100 days prior to admission? Yes  Went to [facility] 2014 for previous rehab. Otherwise has had Home Health Therapy  Current Medications Percocet ASA Plavix Lipitor Lantus Insulin Humalog Insulin Fosrenol Renovite   Patients Current Diet:  Renal ADA diet with fluid restrictions  Precautions / Restrictions Precautions Precautions: Fall Restrictions Weight Bearing Restrictions: No   Has the patient had 2 or more falls or a fall with injury in the past year?No  Prior Activity Level Limited Community (1-2x/wk): goes to outpt dialyis T, TH, Sat . Has been  transfers to wheelchair limited due to TMA issues and wound care issues for a few months. A lot of time spent in bed for LE elevation as instructed  Prior Functional Level Self Care: Did the patient need help bathing, dressing, using the toilet or eating?  Needed some help. Patient sponge bathed at sink in bathroom rather than use tub or shower  Indoor Mobility: Did the patient need assistance with walking from room to room (with or without device)? Needed some help. Pt was wheelchair Mod I transfers due to TMA limitations  Stairs: Did the patient need assistance with internal or external stairs (with or without device)? Needed some help. Used back door entrance in wheelchair with small ramp for 2 inch step up into home.   Functional Cognition: Did the patient need help planning regular tasks such as shopping or remembering to take medications? Independent  Home Equities trader / Equipment Home Assistive Devices/Equipment: Wheelchair, Environmental consultant (specify type)  Prior Device Use: Indicate devices/aids used by the patient prior to current illness, exacerbation or injury? Manual wheelchair   Prior Functional Level Current Functional Level  Bed Mobility  Independent  Min assist. Sitting balance at EOB cga with cues. Sitting up in recliner a lot.    Transfers  Mod Independent  Min assist (would req mod/max assist with getting pants over hips). Pt unable to hold body weight for either pushups from arm rests or to attain fully upright position using RW. Required 2 person mod assist for transfers from recliner to EOB.    Mobility - Walk/Wheelchair   (bed to wheelchair only)  Mod assist (to chair only noted). Noted lean to the left and posteriorly.    Upper Body Dressing  Independent  Min assist. Severely flexed posture.    Lower Body Dressing  Independent  Mod assist. Would require mod to max assist with getting pants over hips.    Grooming  Independent  Independent. Seated at EOB with  cga/sba for sitting balance   Eating/Drinking  Independent  Independent   Toilet Transfer  Mod Independent  Mod assist. 2 person assist with BSC. Have not tried slideboard transfers   Bladder Continence   still voids/continent  continent   Bowel Management  continent  continent   Stair Climbing     Other   Communication  intact  independent/relies on wife for decisions   Memory  intact  intact   Cooking/Meal Prep  wife      Housework  n/a    Money Management  with assist of wife    Driving        Pt able to tolerate only 10 degrees  of range at most due to pain. Movement was effortful and took max time. LLE knee brace. Much encouragement needed to avoid keeping knee flexed.   Special needs/care consideration Dialysis T, TH, Sat [facility] Skin stump incision/ knee flexed                            Bowel mgmt: continent Bladder mgmt: continent Diabetic mgmt IDDM   Previous Home Environment Living Arrangements: Spouse/significant other  Lives With: Spouse Available Help at Discharge: Family, Available 24 hours/day Type of Home: House Home Layout: One level Home Access: Level entry (level entry back of house with 2" step up with small ramp; f) Entrance Stairs-Rails: None Entrance Stairs-Number of Steps: 2 inch step up back of house with small ramp Bathroom Shower/Tub: Tub/shower unit, Walk-in shower (pt sponge bathes at seat in bathroom) Bathroom Toilet: Standard Bathroom Accessibility: Yes How Accessible: Accessible via wheelchair, Accessible via walker [place]: Yes Type of Home Care Services: [place] (if known): unknown Additional Comments: pt has applied for PCS with [facility] per OP SW note  Discharge Living Setting Plans for Discharge Living Setting: Patient's home, Lives with (comment) (spouse) Type of Home at Discharge: House Discharge Home Layout: One level Discharge Home Access: Level entry (2 inch step up  with small ramp to back of house) Entrance Stairs-Rails: None Entrance Stairs-Number of Steps: back entry level with small ramp. Front of house 7-8 steps they do not use Discharge Bathroom Shower/Tub: Tub/shower unit, Walk-in shower (pt sponge bathes at seat in bathroom) Discharge Bathroom Toilet: Standard Discharge Bathroom Accessibility: Yes How Accessible: Accessible via wheelchair, Accessible via walker Does the patient have any problems obtaining your medications?: No Bathroom accessible by RW and wheelchair per wife  Social/Family/Support Systems Patient Roles: Spouse, Parent Contact Information: [name], wife Anticipated Caregiver: wife, Eugene Gavia, sister and children Anticipated Caregiver's Contact Information: [name] [phone] Ability/Limitations of Caregiver: wife COPD and O2 dependent Caregiver Availability: 24/7 Discharge Plan Discussed with Primary Caregiver: Yes Is Caregiver In Agreement with Plan?: Yes Does Caregiver/Family have Issues with Lodging/Transportation while Pt is in Rehab?: No Wife uses RW due to back issues as well as O2 dependent  Goals/Additional Needs Patient/Family Goal for Rehab: Mod I wheelchair level PT, mod I to supervision with OT Expected length of stay: ELOS 7 days Dietary Needs: Renal diet Special Service Needs: Grandson and sister have been transporting pt to OP dilaysis [place] T, TH, Sat Pt/Family Agrees to Admission and willing to participate: Yes Program Orientation Provided & Reviewed with Pt/Caregiver Including Roles  & Responsibilities: Yes  Patient Condition: I have reviewed medical records from [facility], [place], [place], spoke with Social worker, Patient and wife by phone. Patient will benefit from ongoing PT, OT, rehabilitative Nursing and Rehabilitative Medicine from the coordinated team approach offered by Acute Inpatient rehabilitation. He will receive 3 hours per day of therapy and nursing interventions to control pain, assist with  bowel and bladder management and address BKA issues and education. He is currently mod to max assist with transfers and ADLS. We will admit pt to acute inpatient rehab today.  Preadmission Screen Completed By:  Cleatrice Burke, 07/16/2015 12:42 PM ______________________________________________________________________   Discussed status with Dr. Posey Pronto  on  07/16/15  at  1319 and received telephone approval for admission today.  Admission Coordinator:  Cleatrice Burke, time [id number] Date  07/16/2015   Assessment/Plan: Diagnosis: Left BKA 1. Does  the need for close, 24 hr/day  Medical supervision in concert with the patient's rehab needs make it unreasonable for this patient to be served in a less intensive setting? Yes 2. Co-Morbidities requiring supervision/potential complications:  COPD, chronic pain, ESRD, DM type 2 with peripheral neuropathy, PAD, tobacco abuse 3. Due to safety, skin/wound care, disease management, pain management and patient education, does the patient require 24 hr/day rehab nursing? Yes 4. Does the patient require coordinated care of a physician, rehab nurse, PT (1-2 hrs/day, 5 days/week) and OT (1-2 hrs/day, 5 days/week) to address physical and functional deficits in the context of the above medical diagnosis(es)? Yes Addressing deficits in the following areas: balance, endurance, locomotion, strength, transferring, dressing, toileting and psychosocial support 5. Can the patient actively participate in an intensive therapy program of at least 3 hrs of therapy 5 days a week? Yes 6. The potential for patient to make measurable gains while on inpatient rehab is excellent 7. Anticipated functional outcomes upon discharge from inpatients are: modified independent and supervision PT, modified independent OT, n/a SLP 8. Estimated rehab length of stay to reach the above functional goals is: 7-10 days. 9. Does the patient have adequate social supports to accommodate these  discharge functional goals? Yes 10. Anticipated D/C setting: Home 11. Anticipated post D/C treatments: HH therapy and Home excercise program 12. Overall Rehab/Functional Prognosis: excellent    RECOMMENDATIONS: This patient's condition is appropriate for continued rehabilitative care in the following setting: CIR Patient has agreed to participate in recommended program. Yes Note that insurance prior authorization may be required for reimbursement for recommended care.  Cleatrice Burke 07/16/2015  Delice Lesch, MD 07/16/2015

## 2015-07-16 NOTE — H&P (View-Only) (Signed)
Physical Medicine and Rehabilitation Admission H&P    CC:  L-BKA due to critical limb ischemia and non-healing TMA wound.    HPI: John [name] is a 60 year old male with history of COPD, chronic pian, ESRD, DM type 2 with peripheral neuropathy, SCI with ACDF, PAD with ongoing tobacco use s/p left transmetatarsal amputation due to refusal of BKA and SFA atherectomy. He had poor wound healing with necrosis, rest pain and edema and was agreeable to undergo L-BKA on  07/09/15 by Dr.  Donnelly Angelica at [facility]. Plavix resumed post op and left knee brace ordered to help keep LLE in extension due to ongoing issues with spasms. Therapy initiated with education ongoing on desensitization to help with neuropathic pain.  Fentanyl patch added to help with pain control and cymbalta added to help manage neuropathic pain. He continues to have difficulty completing ADL tasks as well as problems with mobility and CIR was recommended for follow up therapy.  Review of Systems  Constitutional: Positive for malaise/fatigue.  Eyes: Negative for blurred vision and double vision.       Wears left prosthesis.   Respiratory: Negative for cough and shortness of breath.   Cardiovascular: Negative for chest pain and palpitations.  Gastrointestinal: Positive for diarrhea. Negative for heartburn, nausea and abdominal pain.  Musculoskeletal: Positive for myalgias and back pain. Negative for falls.  Neurological: Positive for weakness. Negative for dizziness and headaches.  Psychiatric/Behavioral: The patient does not have insomnia.   All other systems reviewed and are negative.     Past Medical History  Diagnosis Date  . COPD (chronic obstructive pulmonary disease) ([place])   . Chronic back pain     Chronic narcotics for this for many years  . Diabetes mellitus   . Hyperlipidemia   . Hypertension   . Foot pain   . Peripheral vascular disease ([place])   . Asthma   . Stroke ([place])     No residual "Mini  strokes"  . Ulcer 1985  . Fracture of foot     Compond- Right  . Neuromuscular disorder ([place])     periferal neuropathy  . Chronic kidney disease     CKD stage III    Past Surgical History  Procedure Laterality Date  . Neck surgery    . Eye surgery    . Cardiac catheterization    . Endarterectomy  12/18/2011    Procedure: ENDARTERECTOMY CAROTID;  Surgeon: Angelia Mould, MD;  Location: [facility];  Service: Vascular;  Laterality: Left;  . Carotid endarterectomy    . Axillary-femoral bypass graft  06/18/2012    Procedure: BYPASS GRAFT AXILLA-BIFEMORAL;  Surgeon: Angelia Mould, MD;  Location: [facility];  Service: Vascular;  Laterality: Right;  Right Axillo to Right Femoral Bypass Graft  . Femoral-popliteal bypass graft  06/18/2012    Procedure: BYPASS GRAFT FEMORAL-POPLITEAL ARTERY;  Surgeon: Angelia Mould, MD;  Location[facility] OR;  Service: Vascular;  Laterality: Right;  Right Femoral to Above Knee Popliteal Bypass Graft  . Amputation  06/18/2012    Procedure: AMPUTATION DIGIT;  Surgeon: Angelia Mould, MD;  Location: [facility] OR;  Service: Vascular;  Laterality: Right;  Right side, amputation of fourth and fifth toes  . Av fistula placement  06/24/2012    Procedure: ARTERIOVENOUS (AV) FISTULA CREATION;  Surgeon: Angelia Mould, MD;  Location: [facility];  Service: Vascular;  Laterality: Left;  . Lower extremity angiogram Right 11/21/2011    Procedure: LOWER EXTREMITY  ANGIOGRAM;  Surgeon: Elam Dutch, MD;  Location: [facility] C[facility] LAB;  Service: Cardiovascular;  Laterality: Right;    Family History  Problem Relation Age of Onset  . Diabetes Mother   . Heart disease Mother   . Hypertension Mother   . Heart attack Mother   . Diabetes Father     Amputation  . Heart disease Father     Before age 19  . Hypertension Father   . Heart attack Father   . Other Father     bleeding problems  . Diabetes Brother   . Heart disease Brother     Before age 10  . Hypertension Brother    . Heart attack Brother     Social History:  Married.  Disabled cook/truck driver. Independent with cane/wheelchair PTA. Reports that wife does not work.  Per reports that he has been smoking 4-5  Cigarettes daily.  He has a 10 pack-year smoking history. He has never used smokeless tobacco. He reports that he does not drink alcohol or use illicit drugs.    Allergies  Allergen Reactions  . Nicoderm [Nicotine] Itching and Rash  . Gabapentin Other (See Comments)    Can't walk, shuts legs down  . Vicodin [Hydrocodone-Acetaminophen] Hives and Itching    (Not in a hospital admission)  Home: One level home with level entry.    Functional History: Independent with ADLs PTA.  Was using wheelchair most of time due to pain and stayed in bed most of the day.  Independent with cane and could stand for 1-2 minutes due to pain.   Functional Status:  Mobility: Min assist for bed mobility with HOB > 30 degrees Min assist +2 for transfers    ADL: Moderate assist with LB bathing  Min assist upper body dressing and grooming.  Max assist lower body dressing.   Cognition: Follows commands without difficulty.     There were no vitals taken for this visit. Physical Exam  Nursing note and vitals reviewed. Constitutional: He is oriented to person, place, and time. He appears well-developed and well-nourished.  HENT:  Head: Atraumatic.  Mouth/Throat: Oropharyngeal exudate present.  Poor dentition  Eyes: Lids are normal. Right conjunctiva is injected. Left eye exhibits abnormal extraocular motion. Left pupil is not reactive.  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   Neck: Normal range of motion. Neck supple.  Cardiovascular: Normal rate and regular rhythm.   Murmur heard. Respiratory: Effort normal and breath sounds normal. No respiratory distress. He exhibits no tenderness.  GI: Soft. Bowel sounds are normal. He exhibits no distension. There is no tenderness. There is no guarding.    Musculoskeletal: He exhibits edema (L-BKA with moderate edema and sutures intact.  Keeps residual limb flexed  with inability to fully extend.).  Fingers b/l atrophic and ischemic appearing on left.  Left 5th finger with dry gangrenous? ulcer.   Left knee with large ischemic area on patella that's tender to touch.  Knee with arthritic changes and tight hamstrings.  Multiple abraded areas on left lateral knee--one with denuded area.  Right heel dark appearing.  Neurological: He is alert and oriented to person, place, and time.  Right exopthalmic.  Tangential needing redirection.  He was able to follow simple motor commands without difficulty.   Motor: b/l RUE: 5/5 LUE 4+/5 LLE: hip flexion 4/5 RLE: 4+/5 proximal to distal  Skin: Skin is warm and dry. He is not diaphoretic.  Ischemic changes as noted  Psychiatric: His speech is tangential. He is  inattentive.    Recent Labs: 07/14/2015  Na- 133  K-4.1  CL-97 Cr- 8.41  BUN- 56 WBC- 9.9   Hgb- 9.8  Hct- 31.0  Plt- 153  Medical Problem List and Plan: 1.  Abnormality of gait, post-operative pain secondary to left BKA. 2.  DVT Prophylaxis/Anticoagulation: Pharmaceutical: Lovenox 3. Pain Management: Fentanyl titrated to 25 mcg/ 72 hrs on 02/04--Continue fentanyl patch with prn oxycodone.  4. Mood: Team to provide ego support. LCSW to follow for evaluation and support.  5. Neuropsych: This patient is capable of making decisions on his own behalf. 6. Skin/Wound Care: Monitor wound daily.  7. Fluids/Electrolytes/Nutrition: Strict I/O. 1200 cc FR/daily 8. PAD: Continue ASA/plavix. 9. ESRD: On HD T, Th, Sa-schedule procedure after therapy session to help with activity tolerance during the day. Daily weights. Strict I/O.  10 DM type 2: Monitor BS with ac/hs checks. Continue SSI for tighter BS control. Continue to hold lantus.  11. Left Knee pain: will check Xrays due to warmth and pain. Question gout.  12. Diarrhea: discontinue senna. Will  monitor for now and check C diff if recurs.   Post Admission Physician Evaluation: 1. Functional deficits secondary  to left BKA. 2. Patient is admitted to receive collaborative, interdisciplinary care between the physiatrist, rehab nursing staff, and therapy team. 3. Patient's level of medical complexity and substantial therapy needs in context of that medical necessity cannot be provided at a lesser intensity of care such as a SNF. 4. Patient has experienced substantial functional loss from his/her baseline which was documented above under the "Functional History" and "Functional Status" headings.  Judging by the patient's diagnosis, physical exam, and functional history, the patient has potential for functional progress which will result in measurable gains while on inpatient rehab.  These gains will be of substantial and practical use upon discharge  in facilitating mobility and self-care at the household level. 5. Physiatrist will provide 24 hour management of medical needs as well as oversight of the therapy plan/treatment and provide guidance as appropriate regarding the interaction of the two. 6. 24 hour rehab nursing will assist with safety, skin/wound care, disease management, pain management and patient education and help integrate therapy concepts, techniques,education, etc. 7. PT will assess and treat for/with: Lower extremity strength, range of motion, stamina, balance, functional mobility, safety, adaptive techniques and equipment, woundcare, coping skills, pain control, pre-prosthetic education.   Goals are: Mod I/Supervision. 8. OT will assess and treat for/with: ADL's, functional mobility, safety, upper extremity strength, adaptive techniques and equipment, wound mgt, ego support, and community reintegration.   Goals are: Mod I/Ind. Therapy may proceed with showering this patient. 9. Case Management and Social Worker will assess and treat for psychological issues and discharge  planning. 10. Team conference will be held weekly to assess progress toward goals and to determine barriers to discharge. 11. Patient will receive at least 3 hours of therapy per day at least 5 days per week. 12. ELOS: 7-10 days.        13. Prognosis:  good  Delice Lesch, MD 07/16/2015

## 2015-07-17 ENCOUNTER — Inpatient Hospital Stay (HOSPITAL_COMMUNITY): Payer: Medicare Other | Admitting: Physical Therapy

## 2015-07-17 ENCOUNTER — Inpatient Hospital Stay (HOSPITAL_COMMUNITY): Payer: Medicare Other

## 2015-07-17 DIAGNOSIS — D638 Anemia in other chronic diseases classified elsewhere: Secondary | ICD-10-CM | POA: Insufficient documentation

## 2015-07-17 DIAGNOSIS — D62 Acute posthemorrhagic anemia: Secondary | ICD-10-CM

## 2015-07-17 LAB — CBC
HCT: 32.1 % — ABNORMAL LOW (ref 39.0–52.0)
HCT: 29.9 % — ABNORMAL LOW (ref 39.0–52.0)
Hemoglobin: 9.9 g/dL — ABNORMAL LOW (ref 13.0–17.0)
Hemoglobin: 9.1 g/dL — ABNORMAL LOW (ref 13.0–17.0)
MCH: 27.4 pg (ref 26.0–34.0)
MCH: 26.8 pg (ref 26.0–34.0)
MCHC: 30.8 g/dL (ref 30.0–36.0)
MCHC: 30.4 g/dL (ref 30.0–36.0)
MCV: 88.9 fL (ref 78.0–100.0)
MCV: 87.9 fL (ref 78.0–100.0)
Platelets: 191 K/uL (ref 150–400)
Platelets: 224 10*3/uL (ref 150–400)
RBC: 3.61 MIL/uL — ABNORMAL LOW (ref 4.22–5.81)
RBC: 3.4 MIL/uL — ABNORMAL LOW (ref 4.22–5.81)
RDW: 17.5 % — ABNORMAL HIGH (ref 11.5–15.5)
RDW: 17.4 % — ABNORMAL HIGH (ref 11.5–15.5)
WBC: 8.5 10*3/uL (ref 4.0–10.5)
WBC: 8.5 10*3/uL (ref 4.0–10.5)

## 2015-07-17 LAB — RENAL FUNCTION PANEL
Albumin: 2.7 g/dL — ABNORMAL LOW (ref 3.5–5.0)
Albumin: 2.9 g/dL — ABNORMAL LOW (ref 3.5–5.0)
Anion gap: 16 — ABNORMAL HIGH (ref 5–15)
Anion gap: 17 — ABNORMAL HIGH (ref 5–15)
BUN: 59 mg/dL — AB (ref 6–20)
BUN: 65 mg/dL — ABNORMAL HIGH (ref 6–20)
CHLORIDE: 95 mmol/L — AB (ref 101–111)
CO2: 26 mmol/L (ref 22–32)
CO2: 24 mmol/L (ref 22–32)
CREATININE: 10.57 mg/dL — AB (ref 0.61–1.24)
Calcium: 9.1 mg/dL (ref 8.9–10.3)
Calcium: 9.3 mg/dL (ref 8.9–10.3)
Chloride: 96 mmol/L — ABNORMAL LOW (ref 101–111)
Creatinine, Ser: 11.27 mg/dL — ABNORMAL HIGH (ref 0.61–1.24)
GFR calc Af Amer: 5 mL/min — ABNORMAL LOW (ref >60)
GFR calc non Af Amer: 4 mL/min — ABNORMAL LOW (ref >60)
GFR, EST AFRICAN AMERICAN: 5 mL/min — AB (ref >60)
GFR, EST NON AFRICAN AMERICAN: 5 mL/min — AB (ref >60)
Glucose, Bld: 93 mg/dL (ref 65–99)
Glucose, Bld: 104 mg/dL — ABNORMAL HIGH (ref 65–99)
POTASSIUM: 4.4 mmol/L (ref 3.5–5.1)
Phosphorus: 7.1 mg/dL — ABNORMAL HIGH (ref 2.5–4.6)
Phosphorus: 7.2 mg/dL — ABNORMAL HIGH (ref 2.5–4.6)
Potassium: 4.4 mmol/L (ref 3.5–5.1)
Sodium: 137 mmol/L (ref 135–145)
Sodium: 137 mmol/L (ref 135–145)

## 2015-07-17 LAB — GLUCOSE, CAPILLARY
GLUCOSE-CAPILLARY: 86 mg/dL (ref 65–99)
GLUCOSE-CAPILLARY: 141 mg/dL — AB (ref 65–99)
Glucose-Capillary: 99 mg/dL (ref 65–99)

## 2015-07-17 LAB — URIC ACID: Uric Acid, Serum: 7.6 mg/dL (ref 4.4–7.6)

## 2015-07-17 MED ORDER — DOCUSATE SODIUM 100 MG PO CAPS
100.0000 mg | ORAL_CAPSULE | Freq: Two times a day (BID) | ORAL | Status: DC
  Administered 2015-07-17 – 2015-07-24 (×15): 100 mg via ORAL
  Filled 2015-07-17 (×15): qty 1

## 2015-07-17 MED ORDER — COLCHICINE 0.6 MG PO TABS
0.6000 mg | ORAL_TABLET | Freq: Once | ORAL | Status: AC
Start: 2015-07-17 — End: 2015-07-17
  Administered 2015-07-17: 0.6 mg via ORAL
  Filled 2015-07-17: qty 1

## 2015-07-17 MED ORDER — ALTEPLASE 2 MG IJ SOLR
2.0000 mg | Freq: Once | INTRAMUSCULAR | Status: DC | PRN
  Filled 2015-07-17: qty 2

## 2015-07-17 MED ORDER — MORPHINE SULFATE (PF) 2 MG/ML IV SOLN
2.0000 mg | Freq: Once | INTRAVENOUS | Status: AC
  Administered 2015-07-17: 2 mg via INTRAVENOUS

## 2015-07-17 MED ORDER — HEPARIN SODIUM (PORCINE) 1000 UNIT/ML DIALYSIS
1000.0000 [IU] | INTRAMUSCULAR | Status: DC | PRN
  Filled 2015-07-17: qty 1

## 2015-07-17 MED ORDER — LIDOCAINE-PRILOCAINE 2.5-2.5 % EX CREA
1.0000 "application " | TOPICAL_CREAM | CUTANEOUS | Status: DC | PRN
  Filled 2015-07-17: qty 5

## 2015-07-17 MED ORDER — SODIUM CHLORIDE 0.9 % IV SOLN: 100.0000 mL | INTRAVENOUS | Status: DC | PRN

## 2015-07-17 MED ORDER — MORPHINE SULFATE (PF) 2 MG/ML IV SOLN
INTRAVENOUS | Status: AC
  Filled 2015-07-17: qty 1

## 2015-07-17 MED ORDER — OXYCODONE-ACETAMINOPHEN 5-325 MG PO TABS
ORAL_TABLET | ORAL | Status: AC
  Filled 2015-07-17: qty 2

## 2015-07-17 MED ORDER — LIDOCAINE HCL (PF) 1 % IJ SOLN
5.0000 mL | INTRAMUSCULAR | Status: DC | PRN
  Filled 2015-07-17: qty 5

## 2015-07-17 MED ORDER — PENTAFLUOROPROP-TETRAFLUOROETH EX AERO: 1.0000 "application " | INHALATION_SPRAY | CUTANEOUS | Status: DC | PRN

## 2015-07-17 NOTE — Plan of Care (Signed)
Problem: RH PAIN MANAGEMENT Goal: RH STG PAIN MANAGED AT OR BELOW PT'S PAIN GOAL Pain < or = 4 out 10 with min assistance  Outcome: Not Progressing Pt reports pain as 8

## 2015-07-17 NOTE — Evaluation (Signed)
Occupational Therapy Assessment and Plan  Patient Details  Name: John [name] MRN: [id number] Date of Birth: Jan 31, 1956  OT Diagnosis: abnormal posture, acute pain and muscle weakness (generalized) Rehab Potential: Rehab Potential (ACUTE ONLY): Good ELOS: 7-10 days   Today's Date: 07/17/2015 OT Individual Time: 5597-4163 OT Individual Time Calculation (min): 75 min     Problem List:  Patient Active Problem List   Diagnosis Date Noted  . Acute blood loss anemia   . Anemia of chronic disease   . Unilateral complete BKA ([place]) 07/16/2015  . Post-operative pain   . Abnormality of gait   . ESRD on dialysis ([place])   . Type 2 diabetes mellitus with complication, with long-term current use of insulin ([place])   . Left knee pain   . Diarrhea   . S/P transmetatarsal amputation of foot ([place]) 04/06/2015  . ESRD (end stage renal disease) ([place])   . Left foot pain 01/20/2015  . PAD (peripheral artery disease) ([place])   . Dry gangrene ([place])   . Cellulitis of left foot 01/19/2015  . Fall against object 07/03/2014  . Knee gives out 07/03/2014  . Left upper arm pain 03/20/2014  . Encounter for chronic pain management 03/10/2014  . Peripheral vascular disease, unspecified ([place]) 03/08/2014  . Aftercare following surgery of the circulatory system, [place] 03/08/2014  . Fecal incontinence 03/23/2013  . PVD (peripheral vascular disease) ([place]) 07/28/2012  . hand pain , steal syndrome LEFT 07/28/2012  . Mitral valve regurgitation 07/05/2012  . Hyperparathyroidism, secondary renal ([place]) 07/05/2012  . Anemia of renal disease 07/05/2012  . Cervical myelopathy ([place]) 07/05/2012  . Occlusion and stenosis of carotid artery without mention of cerebral infarction 12/03/2011  . Atherosclerosis of native arteries of the extremities with intermittent claudication 11/12/2011  . Right carotid bruit 11/12/2011  . Knee pain, left 08/08/2011  . COPD (chronic obstructive pulmonary disease) ([place]) 01/11/2011  . Backache  12/15/2008  . ESRD (end stage renal disease) on dialysis ([place]) 01/21/2008  . NEUROPATHY, DIABETIC 08/06/2006  . Diabetes mellitus with renal complications ([place]) 84/53/6468  . HYPERLIPIDEMIA 08/06/2006  . TOBACCO DEPENDENCE 08/06/2006  . HYPERTENSION, BENIGN SYSTEMIC 08/06/2006  . 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 OSTEOARTHRITIS, LOWER LEG 08/06/2006  . History of cardiovascular disorder 08/06/2006    Past Medical History:  Past Medical History  Diagnosis Date  . COPD (chronic obstructive pulmonary disease) ([place])   . Chronic back pain     Chronic narcotics for this for many years  . Diabetes mellitus   . Hyperlipidemia   . Hypertension   . Foot pain   . Peripheral vascular disease ([place])   . Asthma   . Stroke ([place])     No residual "Mini strokes"  . Ulcer 1985  . Fracture of foot     [place]- Right  . Neuromuscular disorder ([place])     periferal neuropathy  . Chronic kidney disease     CKD stage III   Past Surgical History:  Past Surgical History  Procedure Laterality Date  . Neck surgery    . Eye surgery    . Cardiac catheterization    . Endarterectomy  12/18/2011    Procedure: ENDARTERECTOMY CAROTID;  Surgeon: Angelia Mould, MD;  Location: [place];  Service: Vascular;  Laterality: Left;  . Carotid endarterectomy    . Axillary-femoral bypass graft  06/18/2012    Procedure: BYPASS GRAFT AXILLA-BIFEMORAL;  Surgeon: Angelia Mould, MD;  Location: [place];  Service: Vascular;  Laterality: Right;  Right  [place] to Right Femoral Bypass Graft  . Femoral-popliteal bypass graft  06/18/2012    Procedure: BYPASS GRAFT FEMORAL-POPLITEAL ARTERY;  Surgeon: Angelia Mould, MD;  Location[facility];  Service: Vascular;  Laterality: Right;  Right Femoral to Above Knee Popliteal Bypass Graft  . Amputation  06/18/2012    Procedure: AMPUTATION DIGIT;  Surgeon: Angelia Mould, MD;  Location: [facility] OR;  Service: Vascular;  Laterality: Right;  Right side, amputation of fourth and  fifth toes  . Av fistula placement  06/24/2012    Procedure: ARTERIOVENOUS (AV) FISTULA CREATION;  Surgeon: Angelia Mould, MD;  Location: [place];  Service: Vascular;  Laterality: Left;  . Lower extremity angiogram Right 11/21/2011    Procedure: LOWER EXTREMITY ANGIOGRAM;  Surgeon: Elam Dutch, MD;  Location: [facility] CATH LAB;  Service: Cardiovascular;  Laterality: Right;  . Enucleation Left     trauma as a child. Wears prosthesis.     Assessment & Plan Clinical Impression: Patient is a 60 y.o. male with history of COPD, chronic pian, ESRD, DM type 2 with peripheral neuropathy, SCI with ACDF, PAD with ongoing tobacco use s/p left transmetatarsal amputation due to refusal of BKA and SFA atherectomy. He had poor wound healing with necrosis, rest pain and edema and was agreeable to undergo L-BKA on 07/09/15 by Dr. Donnelly Angelica at [place]. Plavix resumed post op and left knee brace ordered to help keep LLE in extension due to ongoing issues with spasms. Therapy initiated with education ongoing on desensitization to help with neuropathic pain. Fentanyl patch added to help with pain control and cymbalta added to help manage neuropathic pain. He continues to have difficulty completing ADL tasks as well as problems with mobility and CIR was recommended for follow up therapy.  Patient transferred to CIR on 07/16/2015.    Patient currently requires moderate assist with basic self-care skills secondary to muscle weakness.  Prior to hospitalization, patient could complete BADL independently modified by use of DME and extra time.  Patient will benefit from skilled intervention to increase independence with basic self-care skills and increase level of independence with iADL prior to discharge home with care partner.  Anticipate patient will require intermittent supervision and follow up home health.  OT - End of Session Activity Tolerance: Tolerates 30+ min activity with multiple rests Endurance  Deficit: Yes OT Assessment Rehab Potential (ACUTE ONLY): Good OT Patient demonstrates impairments in the following area(s): Sensory;Endurance;Pain;Balance OT Basic ADL's Functional Problem(s): Bathing;Dressing;Toileting OT Advanced ADL's Functional Problem(s): Simple Meal Preparation OT Transfers Functional Problem(s): Toilet;Tub/Shower OT Plan OT Frequency: 5 out of 7 days OT Duration/Estimated Length of Stay: 7-10 days OT Treatment/Interventions: Balance/vestibular training;Discharge planning;Functional mobility training;Pain management;Patient/family education;Therapeutic Exercise;Therapeutic Activities;Self Care/advanced ADL retraining;Wheelchair propulsion/positioning;DME/adaptive equipment instruction;UE/LE Strength taining/ROM OT Self Feeding Anticipated Outcome(s): Mod I OT Basic Self-Care Anticipated Outcome(s): Mod I OT Toileting Anticipated Outcome(s): Mod I OT Bathroom Transfers Anticipated Outcome(s): Supervision OT Recommendation Patient destination: Home Follow Up Recommendations: Home health OT Equipment Recommended: To be determined   Skilled Therapeutic Intervention OT 1:1 initial evaluation with treatment provide to address transfer skills, w/c management, functional mobility using RW, and pt ed on goals and methods of treatment.  Pt performed transfers and attended to discussion with evidence of good carry-over and awareness from prior training.   OT Evaluation Precautions/Restrictions  Precautions Precautions: Fall Restrictions Weight Bearing Restrictions: Yes LLE Weight Bearing: Non weight bearing  General Chart Reviewed: Yes Family/Caregiver Present: No  Pain Pain Assessment Pain Assessment: 0-10  Pain Score: 10-Worst pain ever Pain Location: Leg Pain Orientation: Left Pain Descriptors / Indicators: Sharp Pain Onset: On-going Pain Intervention(s): Medication (See eMAR) Multiple Pain Sites: No  Home Living/Prior Functioning Home Living Available  Help at Discharge: Family, Available 24 hours/day Type of Home: House Home Access: Level entry Entrance Stairs-Number of Steps: 2 inch step up back of house with homemade small ramp Entrance Stairs-Rails: None Home Layout: One level Bathroom Shower/Tub: Tub/shower unit, Multimedia programmer: Standard Bathroom Accessibility: Yes Additional Comments: pt has applied for PCS with [facility] per OP SW note  Lives With: Spouse (1 medium dog, "lady") IADL History Homemaking Responsibilities: Yes Meal Prep Responsibility: Secondary Laundry Responsibility: Primary Cleaning Responsibility: Primary (wife's sister have been helping with homemaking) Bill Paying/Finance Responsibility: No Shopping Responsibility: No (step daughter Asencion Partridge shops for family) Child Care Responsibility: No Current License: Yes Mode of Transportation: Printmaker ([place] [place] Lei, 2005-7) Education: 11th grade Occupation: Unemployed, On disability (Unemployed since 2005) Type of Occupation: Truck delivery, produce Leisure and Hobbies: Fish, dance, clubbing, 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 second language teacher) Prior Function Level of Independence: Independent with basic ADLs, Requires assistive device for independence (Not ambulatory for the last 3 months, using w/c.)  Able to Take Stairs?: Yes Driving: Yes Vocation: On disability Comments: Grandson, Neita Garnet takes pt to OP dialysis T, Thurs, his sister takes him on Sat  ADL ADL ADL Comments: see Functional Assessment Tool  Vision/Perception  Vision- History Baseline Vision/History: Legally blind (left eye prosthesis ) Patient Visual Report: Other (comment) (left eye damaged during childhood accident) Vision- Assessment Vision Assessment?: Yes Eye Alignment: Impaired (comment) (left eye prosthesis aka "glass eye" per pt.) Perception Comments: WFL   Cognition Overall Cognitive Status: Within Functional Limits for tasks assessed Arousal/Alertness:  Awake/alert Orientation Level: Person;Situation (disoriented to place Bennetta Laos, [place])) Year: 2017 Month: February Day of Week: Correct Memory: Appears intact Immediate Memory Recall: Sock;Blue;Bed Memory Recall: Sock;Blue;Bed Memory Recall Sock: Without Cue Memory Recall Blue: Without Cue Memory Recall Bed: Without Cue Attention: Alternating Awareness: Appears intact Problem Solving: Appears intact Safety/Judgment: Appears intact  Sensation Sensation Light Touch: Impaired Detail Light Touch Impaired Details: Impaired left hand, distally superior to PIP Stereognosis: Impaired Detail Stereognosis Impaired Details: Impaired left hand Hot/Cold: Appears Intact Proprioception: Appears Intact Coordination Gross Motor Movements are Fluid and Coordinated: Yes Fine Motor Movements are Fluid and Coordinated: No (left hand impaired) Finger Nose Finger Test: [facility]  Motor  Motor Motor: Abnormal tone Motor - Skilled Clinical Observations: MM spasms LLE into flexion; general weakness  Mobility  Bed Mobility Bed Mobility: Rolling Right;Rolling Left;Supine to Sit;Sit to Supine Rolling Right: 3: Mod assist Rolling Left: 3: Mod assist Supine to Sit: HOB flat;2: Max assist Supine to Sit Details (indicate cue type and reason): Pt required max lifting assistance to transition from sidelying to sit due to poor trunk strength and forwards weight shift Sit to Supine: 4: Min assist;HOB flat Sit to Supine - Details (indicate cue type and reason): Min A for controlled sit > supine due to pain in LLE during trunk/hip extension   Trunk/Postural Assessment  Cervical Assessment Cervical Assessment: Within Functional Limits Thoracic Assessment Thoracic Assessment: Within Functional Limits Lumbar Assessment Lumbar Assessment: Within Functional Limits Postural Control Postural Control: Deficits on evaluation (pt with poor weight shift L; bias to R due to pain)   Balance Balance Balance Assessed:  Yes Static Sitting Balance Static Sitting - Balance Support: No upper extremity supported;Feet supported Static Sitting - Level of Assistance: 6: Modified  independent (Device/Increase time) Dynamic Sitting Balance Dynamic Sitting - Balance Support: Bilateral upper extremity supported;Feet supported Dynamic Sitting - Level of Assistance: 5: Stand by assistance;4: Min assist Static Standing Balance Static Standing - Balance Support: Right upper extremity supported;Left upper extremity supported Static Standing - Level of Assistance: 3: Mod assist;4: Min assist Dynamic Standing Balance Dynamic Standing - Balance Support: Right upper extremity supported;Left upper extremity supported Dynamic Standing - Level of Assistance: 3: Mod assist  Extremity/Trunk Assessment RUE Assessment RUE Assessment: Exceptions to [facility] RUE Strength RUE Overall Strength: Due to premorbid status LUE Assessment LUE Assessment: Exceptions to [facility] LUE Strength LUE Overall Strength: Due to premorbid status   See Function Navigator for Current Functional Status.   Refer to Care Plan for Long Term Goals  Recommendations for other services: None  Discharge Criteria: Patient will be discharged from OT if patient refuses treatment 3 consecutive times without medical reason, if treatment goals not met, if there is a change in medical status, if patient makes no progress towards goals or if patient is discharged from hospital.  The above assessment, treatment plan, treatment alternatives and goals were discussed and mutually agreed upon: by patient   Second session: Time: 1345-1415 Time Calculation (min):  30 min  Pain Assessment: 6/10, residual limb  Skilled Therapeutic Interventions: Therapeutic activity with focus on desensitization of LLE, family education (wife and her HHA present) and pain management of LLE.    Pt received seated in w/c with spouse and her HHA present, attempting to charge pt's cell phone.    Spouse and HHA left after brief introduction and orientation to therapist with explanation of goals of treatment.   Pt noted with poor management of LLE (hanging over w/c flexed at 90 deg).   OT re-educated pt on need to maintain knee extension while seated with demonstration of prolonged low-load stretch and methods to support limb using replacement stump support for w/c.   Pt able to duplicate stretch and continued stump desensitization for remainder of session until received by physical therapist.   See FIM for current functional status  Therapy/Group: Individual Therapy   [place] 07/17/2015, 2:39 PM

## 2015-07-17 NOTE — Progress Notes (Signed)
PHYSICAL MEDICINE & REHABILITATION     PROGRESS NOTE  Subjective/Complaints:  Pt sitting up this AM eating breakfast, about to begin working with OT.  Pt slept well overnight and is in good spirits this AM ready to begin therapies.   ROS: +Constipation. Denies stump pain, CP, SOB, n/v/d.   Objective: Vital Signs: Blood pressure 111/57, pulse 86, temperature 98.4 F (36.9 C), temperature source Oral, resp. rate 16, height 5\' 9"  (1.753 m), weight 72.7 kg (160 lb 4.4 oz), SpO2 95 %. Dg Knee 1-2 Views Left  07/16/2015  CLINICAL DATA:  Rest pain and edema. Left below-the-knee amputation on 07/09/2015. EXAM: LEFT KNEE - 1-2 VIEW COMPARISON:  Knee radiographs of 08/31/2011 FINDINGS: Extensive vascular calcifications. Below-the-knee amputation. Soft tissue swelling about the residual peripheral lower extremity. No soft tissue gas or unexpected radiopaque foreign object. No osseous destruction. IMPRESSION: Interval below the knee amputation, with nonspecific soft tissue edema. Electronically Signed   By: Abigail Miyamoto M.D.   On: 07/16/2015 18:27    Recent Labs  07/17/15 0633  WBC 8.5  HGB 9.9*  HCT 32.1*  PLT 191    Recent Labs  07/17/15 0633  NA 137  K 4.4  CL 95*  GLUCOSE 93  BUN 59*  CREATININE 10.57*  CALCIUM 9.1   CBG (last 3)   Recent Labs  07/16/15 1730 07/16/15 2127 07/17/15 0653  GLUCAP 93 105* 86    Wt Readings from Last 3 Encounters:  07/17/15 72.7 kg (160 lb 4.4 oz)  06/13/15 82.419 kg (181 lb 11.2 oz)  01/19/15 86.4 kg (190 lb 7.6 oz)    Physical Exam:  BP 111/57 mmHg  Pulse 86  Temp(Src) 98.4 F (36.9 C) (Oral)  Resp 16  Ht 5\' 9"  (1.753 m)  Wt 72.7 kg (160 lb 4.4 oz)  BMI 23.66 kg/m2  SpO2 95% Constitutional: He appears well-developed and well-nourished.  NAD HENT: Atraumatic.  Mouth/Throat: Poor dentition  Eyes: Lids are normal. Right conjunctiva is injected. Left eye exhibits abnormal extraocular motion. 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   Cardiovascular: Normal rate and regular rhythm. Murmur heard. Respiratory: Effort normal and breath sounds normal. No respiratory distress. He exhibits no tenderness.  GI: Soft. Bowel sounds are normal. He exhibits no distension. There is no tenderness. There is no guarding.  Musculoskeletal: He exhibits edema (L-BKA with moderate edema and sutures intact. Residual limb flexed with inability to fully extend.).  Fingers b/l atrophic and ischemic appearing on left.  Left knee with large ischemic vs DTI on patella that's tender to touch.  Knee with arthritic changes and tight hamstrings.  Right heel dark appearing.  Neurological: He is alert and oriented. Right exopthalmic.  Tangential needing redirection.  He was able to follow simple motor commands without difficulty.  Motor: RUE: 5/5 LUE 4+/5 LLE: hip flexion 4/5 RLE: 4+/5 proximal to distal  Skin: Skin is warm and dry. He is not diaphoretic. Ischemic changes as noted  Psychiatric: His speech is tangential. Mood/affect normal.   Assessment/Plan: 1. Functional deficits secondary to left BKA which require 3+ hours per day of interdisciplinary therapy in a comprehensive inpatient rehab setting. Physiatrist is providing close team supervision and 24 hour management of active medical problems listed below. Physiatrist and rehab team continue to assess barriers to discharge/monitor patient progress toward functional and medical goals.  Function:  Bathing Bathing position      Bathing parts      Bathing assist  Upper Body Dressing/Undressing Upper body dressing                    Upper body assist        Lower Body Dressing/Undressing Lower body dressing                                  Lower body assist        Toileting Toileting          Toileting assist     Transfers Chair/bed transfer             Locomotion Ambulation           Wheelchair           Cognition Comprehension    Expression    Social Interaction    Problem Solving    Memory      Medical Problem List and Plan: 1. Abnormality of gait, post-operative pain secondary to left BKA on 1/30. Clean amputation daily with soap and water Monitor incision site for signs of infection or impending skin breakdown. Staples to remain in place for 3-4 weeks Stump shrinker, for edema control in future Scar mobilization massaging to prevent soft tissue adherence Stump protector during therapies Prevent flexion contractures by implementing the following:   Encourage prone lying for 20-30 mins per day BID to avoid hip flexion contractures if medically appropriate;  Avoid pillow under knees when patient is lying in bed in order to prevent both  knee and hip flexion contractures;  Avoid prolonged sitting When using wheelchair, patient should have knee on amputated side fully extended with board under the seat cushion. Avoid injury to contralateral side 2. DVT Prophylaxis/Anticoagulation: Pharmaceutical: Lovenox 3. Pain Management: Fentanyl titrated to 25 mcg/ 72 hrs on 02/04--Continue fentanyl patch with prn oxycodone.  4. Mood: Team to provide ego support. LCSW to follow for evaluation and support.  5. Neuropsych: This patient is capable of making decisions on his own behalf. 6. Skin/Wound Care: Monitor wound daily.  7. Fluids/Electrolytes/Nutrition: Strict I/O. 1200 cc FR/daily 8. PAD: Continue ASA/plavix. 9. ESRD: On HD T, Th, Sa-schedule procedure after therapy session to help with activity tolerance during the day. Daily weights. Strict I/O.  10 DM type 2: Monitor BS with ac/hs checks. Continue SSI for tighter BS control. Continue to hold lantus.   Fasting CBP 86 this AM 11. Left Knee pain: Xray reviewed, noting soft tissue swelling   Possible gout - will give one time dose of colchicine on 2/7 12. Bowel: Pt notes diarrhea and constipation at times.  Will cont to adjust  meds 13. Acute on chronic anemia  Hb 9.9 on 2/7  Will cont to monitor  LOS (Days) 1 A FACE TO FACE EVALUATION WAS PERFORMED  John [name] Lorie Phenix 07/17/2015 8:59 AM

## 2015-07-17 NOTE — Progress Notes (Signed)
Physical Therapy Note  Patient Details  Name: John [name] MRN: [id number] Date of Birth: 1955/12/31 Today's Date: 07/17/2015    Time: 1415-1445 30 minutes  1:1 Pt c/o residual limb pain. Pain meds given prior to session, ice pack applied after session.  Pt propelled w/c 150' x 2 with supervision, limited by UE fatigue.  Car transfer to simulated mini van height.  With RW to passenger side with mod A for hop/turn.  Pt then demo'd to PT how he performed transfers prior to admission.  Pt stand pivot to drivers side holding onto steering wheel and car door with PT stabilizing door. Pt able to perform transfer with close supervision.  PT encouraged safe transfer and not to hold on to wheel and door, pt states "this is how I do it" and states "I've been doing it this way and I know it works".  PT encouraged pt to work with PT and follow medical recommendation of no driving this close to surgery.   [name] 07/17/2015, 2:54 PM

## 2015-07-17 NOTE — Evaluation (Signed)
Physical Therapy Assessment and Plan  Patient Details  Name: John [name] MRN: [id number] Date of Birth: [birth year]  PT Diagnosis: Difficulty walking, Impaired sensation, Muscle spasms, Muscle weakness and Pain in L residual limb Rehab Potential: Good ELOS: 7-10 days   Today's Date: 07/17/2015 PT Individual Time: 1009-1105 PT Individual Time Calculation (min): 56 min    Problem List:  Patient Active Problem List   Diagnosis Date Noted  . Acute blood loss anemia   . Anemia of chronic disease   . Unilateral complete BKA ([place]) 07/16/2015  . Post-operative pain   . Abnormality of gait   . ESRD on dialysis ([place])   . Type 2 diabetes mellitus with complication, with long-term current use of insulin ([place])   . Left knee pain   . Diarrhea   . S/P transmetatarsal amputation of foot ([place]) 04/06/2015  . ESRD (end stage renal disease) ([place])   . Left foot pain 01/20/2015  . PAD (peripheral artery disease) ([place])   . Dry gangrene ([place])   . Cellulitis of left foot 01/19/2015  . Fall against object 07/03/2014  . Knee gives out 07/03/2014  . Left upper arm pain 03/20/2014  . Encounter for chronic pain management 03/10/2014  . Peripheral vascular disease, unspecified ([place]) 03/08/2014  . Aftercare following surgery of the circulatory system, [place] 03/08/2014  . Fecal incontinence 03/23/2013  . PVD (peripheral vascular disease) ([place]) 07/28/2012  . hand pain , steal syndrome LEFT 07/28/2012  . Mitral valve regurgitation 07/05/2012  . Hyperparathyroidism, secondary renal ([place]) 07/05/2012  . Anemia of renal disease 07/05/2012  . Cervical myelopathy ([place]) 07/05/2012  . Occlusion and stenosis of carotid artery without mention of cerebral infarction 12/03/2011  . Atherosclerosis of native arteries of the extremities with intermittent claudication 11/12/2011  . Right carotid bruit 11/12/2011  . Knee pain, left 08/08/2011  . COPD (chronic obstructive pulmonary disease) ([place]) 01/11/2011  . Backache  12/15/2008  . ESRD (end stage renal disease) on dialysis ([place]) 01/21/2008  . NEUROPATHY, DIABETIC 08/06/2006  . Diabetes mellitus with renal complications ([place]) 06/01/8249  . HYPERLIPIDEMIA 08/06/2006  . TOBACCO DEPENDENCE 08/06/2006  . HYPERTENSION, BENIGN SYSTEMIC 08/06/2006  . 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 OSTEOARTHRITIS, LOWER LEG 08/06/2006  . History of cardiovascular disorder 08/06/2006    Past Medical History:  Past Medical History  Diagnosis Date  . COPD (chronic obstructive pulmonary disease) ([place])   . Chronic back pain     Chronic narcotics for this for many years  . Diabetes mellitus   . Hyperlipidemia   . Hypertension   . Foot pain   . Peripheral vascular disease ([place])   . Asthma   . Stroke ([place])     No residual "Mini strokes"  . Ulcer 1985  . Fracture of foot     [place]- Right  . Neuromuscular disorder ([place])     periferal neuropathy  . Chronic kidney disease     CKD stage III   Past Surgical History:  Past Surgical History  Procedure Laterality Date  . Neck surgery    . Eye surgery    . Cardiac catheterization    . Endarterectomy  12/18/2011    Procedure: ENDARTERECTOMY CAROTID;  Surgeon: Angelia Mould, MD;  Location: [facility];  Service: Vascular;  Laterality: Left;  . Carotid endarterectomy    . Axillary-femoral bypass graft  06/18/2012    Procedure: BYPASS GRAFT AXILLA-BIFEMORAL;  Surgeon: Angelia Mould, MD;  Location: [facility];  Service: Vascular;  Laterality: Right;  Right Axillo to Right Femoral Bypass Graft  . Femoral-popliteal bypass graft  06/18/2012    Procedure: BYPASS GRAFT FEMORAL-POPLITEAL ARTERY;  Surgeon: Angelia Mould, MD;  [facility];  Service: Vascular;  Laterality: Right;  Right Femoral to Above Knee Popliteal Bypass Graft  . Amputation  06/18/2012    Procedure: AMPUTATION DIGIT;  Surgeon: Angelia Mould, MD;  Location: [facility] OR;  Service: Vascular;  Laterality: Right;  Right side, amputation of fourth and  fifth toes  . Av fistula placement  06/24/2012    Procedure: ARTERIOVENOUS (AV) FISTULA CREATION;  Surgeon: Angelia Mould, MD;  Location: [place];  Service: Vascular;  Laterality: Left;  . Lower extremity angiogram Right 11/21/2011    Procedure: LOWER EXTREMITY ANGIOGRAM;  Surgeon: Elam Dutch, MD;  Location: [facility] CATH LAB;  Service: Cardiovascular;  Laterality: Right;  . Enucleation Left     trauma as a child. Wears prosthesis.     Assessment & Plan Clinical Impression: Patient is a 60 year old male with history of COPD, chronic pian, ESRD, DM type 2 with peripheral neuropathy, SCI with ACDF, PAD with ongoing tobacco use s/p left transmetatarsal amputation due to refusal of BKA and SFA atherectomy. He had poor wound healing with necrosis, rest pain and edema and was agreeable to undergo L-BKA on 07/09/15 by Dr. Donnelly Angelica at [place] [facility]. Plavix resumed post op and left knee brace ordered to help keep LLE in extension due to ongoing issues with spasms. Therapy initiated with education ongoing on desensitization to help with neuropathic pain. Fentanyl patch added to help with pain control and cymbalta added to help manage neuropathic pain.  Patient transferred to CIR on 07/16/2015 .   Patient currently requires mod with mobility secondary to muscle weakness and muscle joint tightness, pain in L limb, abnormal tone and decreased sitting balance, decreased standing balance, decreased postural control and decreased balance strategies.  Prior to hospitalization, patient was modified independent  with mobility and lived with Spouse in a House home.  Home access is 2 inch step up back of house with homemade small rampRamped entrance.  Patient will benefit from skilled PT intervention to maximize safe functional mobility, minimize fall risk and decrease caregiver burden for planned discharge home with 24 hour supervision.  Anticipate patient will benefit from follow up [place] at discharge.  PT - End  of Session Activity Tolerance: Decreased this session Endurance Deficit: Yes Endurance Deficit Description: pain PT Assessment Rehab Potential (ACUTE/IP ONLY): Good PT Patient demonstrates impairments in the following area(s): Balance;Endurance;Motor;Pain;Skin Integrity;Sensory PT Transfers Functional Problem(s): Bed Mobility;Bed to Chair;Car;Furniture PT Locomotion Functional Problem(s): Ambulation;Wheelchair Mobility;Stairs PT Plan PT Intensity: Minimum of 1-2 x/day ,45 to 90 minutes PT Frequency: 5 out of 7 days PT Duration Estimated Length of Stay: 7-10 days PT Treatment/Interventions: Ambulation/gait training;Balance/vestibular training;Community reintegration;Discharge planning;Disease management/prevention;DME/adaptive equipment instruction;Functional mobility training;Neuromuscular re-education;Pain management;Patient/family education;Psychosocial support;Skin care/wound management;Splinting/orthotics;Stair training;Therapeutic Activities;Therapeutic Exercise;UE/LE Strength taining/ROM;Wheelchair propulsion/positioning PT Transfers Anticipated Outcome(s): Mod I PT Locomotion Anticipated Outcome(s): Mod I w/c, supervision gait short distances PT Recommendation Follow Up Recommendations: Home health PT;24 hour supervision/assistance Patient destination: Home Equipment Recommended: Other (comment) Equipment Details: amputee support pad for L limb for current w/c  Skilled Therapeutic Intervention AM session: pt participated in skilled PT evaluation.  Performed w/c mobility, transfers, supine <> sit, and gait with RW as below.  Switched pt w/c to 16 x 18 w/c with cushion.  While on mat performed supine LLE hip IR and knee extension stretching and  education on positioning and exercises for preservation of ROM to prepare for prosthesis and for pain management.  Pt reporting strong spasms in L limb.  Pt may benefit from use of Kinesiotaping and prolonged positioning in prone for mm tone/spasm  reduction.  At end of session pt returned to room to speak with social worker; pt left with LLE supported on support pad.       PT Evaluation Precautions/Restrictions Precautions Precautions: Fall Restrictions Weight Bearing Restrictions: Yes LLE Weight Bearing: Non weight bearing General Chart Reviewed: Yes Response to Previous Treatment: Patient with no complaints from previous session. Family/Caregiver Present: No Vital Signs Pain Pain Assessment Pain Score: 7  Pain Type: Surgical pain;Neuropathic pain Pain Location: Leg Pain Orientation: Left Pain Descriptors / Indicators: Spasm;Throbbing Pain Onset: On-going Pain Intervention(s): RN made aware Home Living/Prior Functioning Home Living Living Arrangements: Spouse/significant other Available Help at Discharge: Family;Available 24 hours/day Type of Home: House Home Access: Ramped entrance Entrance Stairs-Number of Steps: 2 inch step up back of house with homemade small ramp Entrance Stairs-Rails: None Home Layout: One level Additional Comments: Pt has manual w/c, RW, cane  Lives With: Spouse Prior Function Level of Independence: Requires assistive device for independence;Independent with gait;Independent with transfers (Following TMA required use of w/c; prior to TMA used RW/cane)  Able to Take Stairs?: No (yes prior to TMA, no after TMA and now BKA) Driving: No (yes before TMA, no after TMA-depends on family members) Vocation: On disability Comments: Grandson, Neita Garnet takes pt to OP dialysis T, Thurs, his sister takes him on Sat Cognition Overall Cognitive Status: Within Functional Limits for tasks assessed Arousal/Alertness: Awake/alert Attention: Alternating Memory: Appears intact Awareness: Appears intact Problem Solving: Appears intact Safety/Judgment: Appears intact Sensation Sensation Light Touch: Impaired by gross assessment Stereognosis: Not tested Hot/Cold: Not tested Proprioception: Appears  Intact Coordination Gross Motor Movements are Fluid and Coordinated: Not tested Fine Motor Movements are Fluid and Coordinated: Not tested Motor  Motor Motor: Abnormal tone Motor - Skilled Clinical Observations: MM spasms LLE into flexion; general weakness  Mobility Bed Mobility Bed Mobility: Rolling Right;Rolling Left;Supine to Sit;Sit to Supine Rolling Right: 3: Mod assist Rolling Left: 3: Mod assist Supine to Sit: HOB flat;2: Max assist Supine to Sit Details (indicate cue type and reason): Pt required max lifting assistance to transition from sidelying to sit due to poor trunk strength and forwards weight shift Sit to Supine: 4: Min assist;HOB flat Sit to Supine - Details (indicate cue type and reason): Min A for controlled sit > supine due to pain in LLE during trunk/hip extension Transfers Transfers: Yes Squat Pivot Transfers: 4: Min assist;With armrests Squat Pivot Transfer Details (indicate cue type and reason): Squat pivot w/c <> flat mat with min A to stabilize w/c, for balance and to maintain squat during full pivot; verbal cues required for sequence Locomotion  Ambulation Ambulation: Yes Ambulation/Gait Assistance: 3: Mod assist Ambulation Distance (Feet): 5 Feet Assistive device: Rolling walker Ambulation/Gait Assistance Details: Gait x 5' with RW with mod A for balance and safety as pt would attempted to hop with R foot and R knee would buckle slightly; also required assistance for control of RW and for balance while pivoting and verbal cues for safe sequence Stairs / Additional Locomotion Stairs: No Wheelchair Mobility Wheelchair Mobility: Yes Wheelchair Assistance: 5: Careers information officer: Both upper extremities Wheelchair Parts Management: Needs assistance Distance: 150'   Trunk/Postural Assessment  Cervical Assessment Cervical Assessment: Within Functional Limits Thoracic Assessment Thoracic Assessment: Within Functional Limits Lumbar  Assessment Lumbar Assessment: Within Functional Limits Postural Control Postural Control: Deficits on evaluation (pt with poor weight shift L; bias to R due to pain)  Balance Balance Balance Assessed: Yes Static Sitting Balance Static Sitting - Balance Support: No upper extremity supported;Feet supported Static Sitting - Level of Assistance: 6: Modified independent (Device/Increase time) Dynamic Sitting Balance Dynamic Sitting - Balance Support: Bilateral upper extremity supported;Feet supported Dynamic Sitting - Level of Assistance: 5: Stand by assistance;4: Min assist Static Standing Balance Static Standing - Balance Support: Right upper extremity supported;Left upper extremity supported Static Standing - Level of Assistance: 3: Mod assist;4: Min assist Dynamic Standing Balance Dynamic Standing - Balance Support: Right upper extremity supported;Left upper extremity supported Dynamic Standing - Level of Assistance: 3: Mod assist Extremity Assessment  RUE Assessment RUE Assessment: Exceptions to [facility] RUE Strength RUE Overall Strength: Due to premorbid status LUE Assessment LUE Assessment: Exceptions to [facility] LUE Strength LUE Overall Strength: Due to premorbid status RLE Assessment RLE Assessment: Within Functional Limits (except hip flexion 4-/5) LLE Assessment LLE Assessment: Exceptions to WFL LLE AROM (degrees) Left Knee Extension: -60 LLE Strength LLE Overall Strength: Deficits;Due to pain LLE Overall Strength Comments: Pt with decreased hip and knee extension ROM; 3/5 hip flexion, 3/5 knee extension within available range, 3/5 knee flexion   See Function Navigator for Current Functional Status.   Refer to Care Plan for Long Term Goals  Recommendations for other services: None  Discharge Criteria: Patient will be discharged from PT if patient refuses treatment 3 consecutive times without medical reason, if treatment goals not met, if there is a change in medical status,  if patient makes no progress towards goals or if patient is discharged from hospital.  The above assessment, treatment plan, treatment alternatives and goals were discussed and mutually agreed upon: by patient  Malachy Mood 07/17/2015, 12:51 PM

## 2015-07-17 NOTE — Consult Note (Signed)
[facility] Renal Consultation Note    Indication for Consultation:  Management of ESRD/hemodialysis; anemia, hypertension/volume and secondary hyperparathyroidism PCP:  HPI: John [name] is a 60 y.o. male with ESRD on hemodialysis MWF at [facility]. Past medical history significant for ESRD, anemia of chronic disease, secondary hyperparathyroidism, hypertension, DMT2, hyperlipidemia, asthma, CVA, PAD, neuropathy, ongoing tobacco abuse. 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  He had L. Transmetatarsal amputation done which failed to heal. 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 Maudie Mercury at [facility]. He has been transferred to [facility] for inpatient rehabilitation. He has ongoing pain issues, inability to straighten L BKA stump, has darkened area on L patella, has abrasions on L. Knee and what appears to be dry gangrene of 5th digit of L hand. "Don't look too close, you might find something else wrong with me!" Patient awake, talkative, without C/O pain at present. Is very concerned about not being able to straighten L. Stump-works compulsively to attempt this. Reassured that rehab will help him. Denies chest pain, SOB, N,V,D, fevers, chills, malaise, abdominal pain, flank pain, headache, blurred vision or hearing changes.   Patient has hemodialysis MWF at [facility]. Has regular attendance, compliant with HD prescription. Last in-center labs as follows: HGB 10.3 (07/05/15) Ca 9.4 Phos 7.4 (07/05/15). PTH 92 06/21/15). He is on mircera, venofer for anemia, fosrenol for phosphorous suppression.    Past Medical History  Diagnosis Date  . COPD (chronic obstructive pulmonary disease) ([place])   . Chronic back pain     Chronic narcotics for this for many years  . Diabetes mellitus   . Hyperlipidemia   . Hypertension   . Foot pain   . Peripheral vascular disease ([place])   . Asthma   . Stroke ([place])     No residual "Mini strokes"  . Ulcer 1985  . Fracture of foot     [place]- Right  . Neuromuscular  disorder ([place])     periferal neuropathy  . Chronic kidney disease     CKD stage III   Past Surgical History  Procedure Laterality Date  . Neck surgery    . Eye surgery    . Cardiac catheterization    . Endarterectomy  12/18/2011    Procedure: ENDARTERECTOMY CAROTID;  Surgeon: Angelia Mould, MD;  Location: [place];  Service: Vascular;  Laterality: Left;  . Carotid endarterectomy    . Axillary-femoral bypass graft  06/18/2012    Procedure: BYPASS GRAFT AXILLA-BIFEMORAL;  Surgeon: Angelia Mould, MD;  Location: [place];  Service: Vascular;  Laterality: Right;  Right Axillo to Right Femoral Bypass Graft  . Femoral-popliteal bypass graft  06/18/2012    Procedure: BYPASS GRAFT FEMORAL-POPLITEAL ARTERY;  Surgeon: Angelia Mould, MD;  Location: [facility] OR;  Service: Vascular;  Laterality: Right;  Right Femoral to Above Knee Popliteal Bypass Graft  . Amputation  06/18/2012    Procedure: AMPUTATION DIGIT;  Surgeon: Angelia Mould, MD;  Location: [facility] OR;  Service: Vascular;  Laterality: Right;  Right side, amputation of fourth and fifth toes  . Av fistula placement  06/24/2012    Procedure: ARTERIOVENOUS (AV) FISTULA CREATION;  Surgeon: Angelia Mould, MD;  Location: [place];  Service: Vascular;  Laterality: Left;  . Lower extremity angiogram Right 11/21/2011    Procedure: LOWER EXTREMITY ANGIOGRAM;  Surgeon: Elam Dutch, MD;  Location: [facility] CATH LAB;  Service: Cardiovascular;  Laterality: Right;  . Enucleation Left     trauma as a child. Wears  prosthesis.    Family History  Problem Relation Age of Onset  . Diabetes Mother   . Heart disease Mother   . Hypertension Mother   . Heart attack Mother   . Diabetes Father     Amputation  . Heart disease Father     Before age 28  . Hypertension Father   . Heart attack Father   . Other Father     bleeding problems  . Diabetes Brother   . Heart disease Brother     Before age 13  . Hypertension Brother   . Heart attack  Brother    Social History:  reports that he has been smoking Cigarettes.  He has a 10 pack-year smoking history. He has never used smokeless tobacco. He reports that he does not drink alcohol or use illicit drugs. Allergies  Allergen Reactions  . Nicoderm [Nicotine] Itching and Rash  . Gabapentin Other (See Comments)    Can't walk, shuts legs down  . Vicodin [Hydrocodone-Acetaminophen] Hives and Itching   Prior to Admission medications   Medication Sig Start Date End Date Taking? Authorizing Provider  aspirin 81 MG chewable tablet Chew 81 mg by mouth daily.    Yes Historical Provider, MD  atorvastatin (LIPITOR) 40 MG tablet Take 40 mg by mouth every evening.   Yes Historical Provider, MD  cholecalciferol (VITAMIN D) 1000 UNITS tablet Take 1,000 Units by mouth daily.   Yes Historical Provider, MD  clopidogrel (PLAVIX) 75 MG tablet Take 75 mg by mouth daily.   Yes Historical Provider, MD  insulin lispro (HUMALOG) 100 UNIT/ML injection Inject 2-3 Units into the skin 2 (two) times daily as needed for high blood sugar (CBG >180). Per sliding scale: inject 2 units subcutaneously for CBG 180-299, inject 3 units for CBG >300 08/13/10  Yes Zenia Resides, MD  lanthanum (FOSRENOL) 1000 MG chewable tablet Chew 1,000 mg by mouth See admin instructions. Take 1 tablet (1000 mg) by mouth with large meals (once every 2-3 days)   Yes Historical Provider, MD  LANTUS 100 UNIT/ML injection INJECT 6 UNITS INTO THE SKIN DAILY Patient taking differently: INJECT 30 UNITS INTO THE SKIN BEFORE BREAKFAST IF NEEDED FOR CBG >140 10/30/14  Yes [name], MD  lidocaine-prilocaine (EMLA) cream Apply 1 application topically 3 (three) times a week. Prior to dialysis   Yes Historical Provider, MD  multivitamin (RENA-VIT) TABS tablet Take 1 tablet by mouth daily.  04/12/14  Yes Historical Provider, MD  PROAIR HFA 108 (90 Base) MCG/ACT inhaler INHALE 2 PUFFS BY MOUTH EVERY 4 HOURS AS NEEDED FOR WHEEZING OR SHORTNESS OF BREATH  06/27/15  Yes [name], MD  darbepoetin (ARANESP) 100 MCG/0.5ML SOLN Inject 0.5 mLs (100 mcg total) into the skin every Monday. 06/24/12   Timmothy Euler, MD  lidocaine-EPINEPHrine-tetracaine (LET) GEL Apply topically to left arm 1 hour before dialysis Patient not taking: Reported on 07/16/2015 03/20/14   Vivi Barrack, MD  oxyCODONE-acetaminophen (PERCOCET) 10-325 MG tablet Take 1 tablet by mouth every 6 (six) hours as needed for pain. Patient not taking: Reported on 07/16/2015 06/27/15   [name], MD  oxyCODONE-acetaminophen (PERCOCET/ROXICET) 5-325 MG tablet Take 1 tablet by mouth every 4 (four) hours as needed (pain).  04/12/15   Historical Provider, MD   Current Facility-Administered Medications  Medication Dose Route Frequency Provider Last Rate Last Dose  . aluminum hydroxide (AMPHOJEL/ALTERNAGEL) suspension 10 mL  10 mL Oral Q4H PRN Bary Leriche, PA-C      .  aspirin EC tablet 81 mg  81 mg Oral Daily Pamela S Love, PA-C      . atorvastatin (LIPITOR) tablet 40 mg  40 mg Oral q1800 Ivan Anchors Love, PA-C   40 mg at 07/16/15 1841  . bisacodyl (DULCOLAX) suppository 10 mg  10 mg Rectal Daily PRN Bary Leriche, PA-C      . clopidogrel (PLAVIX) tablet 75 mg  75 mg Oral Daily Pamela S Love, PA-C      . diphenhydrAMINE (BENADRYL) 12.5 MG/5ML elixir 12.5-25 mg  12.5-25 mg Oral Q6H PRN Ivan Anchors Love, PA-C      . docusate sodium (COLACE) capsule 100 mg  100 mg Oral BID Ankit Lorie Phenix, MD   100 mg at 07/17/15 1037  . DULoxetine (CYMBALTA) DR capsule 20 mg  20 mg Oral Daily Pamela S Love, PA-C      . enoxaparin (LOVENOX) injection 30 mg  30 mg Subcutaneous Q24H Pamela S Love, PA-C   30 mg at 07/16/15 1840  . feeding supplement (NEPRO CARB STEADY) liquid 237 mL  237 mL Oral BID BM Ivan Anchors Love, PA-C   237 mL at 07/17/15 0848  . fentaNYL (DURAGESIC - dosed mcg/hr) patch 25 mcg  25 mcg Transdermal Q72H Ivan Anchors Love, PA-C   25 mcg at 07/16/15 1840  . guaiFENesin-dextromethorphan (ROBITUSSIN DM) 100-10  MG/5ML syrup 5-10 mL  5-10 mL Oral Q6H PRN Bary Leriche, PA-C      . hydrocerin (EUCERIN) cream   Topical BID Ivan Anchors Love, PA-C      . insulin aspart (novoLOG) injection 0-5 Units  0-5 Units Subcutaneous QHS Bary Leriche, PA-C   0 Units at 07/16/15 2200  . insulin aspart (novoLOG) injection 0-9 Units  0-9 Units Subcutaneous TID WC Bary Leriche, PA-C   0 Units at 07/16/15 1813  . insulin glargine (LANTUS) injection 5 Units  5 Units Subcutaneous Daily Bary Leriche, PA-C   5 Units at 07/17/15 [phone]  . lanthanum (FOSRENOL) chewable tablet 1,000 mg  1,000 mg Oral TID WC Ivan Anchors Love, PA-C   1,000 mg at 07/17/15 0847  . methocarbamol (ROBAXIN) tablet 500 mg  500 mg Oral Q6H PRN Ivan Anchors Love, PA-C   500 mg at 07/17/15 1037  . multivitamin (RENA-VIT) tablet 1 tablet  1 tablet Oral QHS Bary Leriche, PA-C   1 tablet at 07/16/15 2150  . oxyCODONE-acetaminophen (PERCOCET/ROXICET) 5-325 MG per tablet 1-2 tablet  1-2 tablet Oral Q4H PRN Bary Leriche, PA-C   2 tablet at 07/17/15 1037  . promethazine (PHENERGAN) tablet 12.5 mg  12.5 mg Oral Q6H PRN Bary Leriche, PA-C       Or  . promethazine (PHENERGAN) suppository 12.5 mg  12.5 mg Rectal Q6H PRN Bary Leriche, PA-C       Or  . promethazine (PHENERGAN) injection 12.5 mg  12.5 mg Intramuscular Q6H PRN Pamela S Love, PA-C      . sorbitol 70 % solution 30 mL  30 mL Oral BID PRN Bary Leriche, PA-C      . traZODone (DESYREL) tablet 25-50 mg  25-50 mg Oral QHS PRN Bary Leriche, PA-C       Facility-Administered Medications Ordered in Other Encounters  Medication Dose Route Frequency Provider Last Rate Last Dose  . ferumoxytol (FERAHEME) 1,020 mg in sodium chloride 0.9 % 100 mL IVPB  1,020 mg Intravenous Once Corliss Parish, MD       Labs: Basic  Metabolic Panel:  Recent Labs Lab 07/17/15 0633  NA 137  K 4.4  CL 95*  CO2 26  GLUCOSE 93  BUN 59*  CREATININE 10.57*  CALCIUM 9.1  PHOS 7.1*   Liver Function Tests:  Recent Labs Lab  07/17/15 0633  ALBUMIN 2.7*   No results for input(s): LIPASE, AMYLASE in the last 168 hours. No results for input(s): AMMONIA in the last 168 hours. CBC:  Recent Labs Lab 07/17/15 0633  WBC 8.5  HGB 9.9*  HCT 32.1*  MCV 88.9  PLT 191   Cardiac Enzymes: No results for input(s): CKTOTAL, CKMB, CKMBINDEX, TROPONINI in the last 168 hours. CBG:  Recent Labs Lab 07/16/15 1730 07/16/15 2127 07/17/15 0653  GLUCAP 93 105* 86   Iron Studies: No results for input(s): IRON, TIBC, TRANSFERRIN, FERRITIN in the last 72 hours. Studies/Results: Dg Knee 1-2 Views Left  07/16/2015  CLINICAL DATA:  Rest pain and edema. Left below-the-knee amputation on 07/09/2015. EXAM: LEFT KNEE - 1-2 VIEW COMPARISON:  Knee radiographs of 08/31/2011 FINDINGS: Extensive vascular calcifications. Below-the-knee amputation. Soft tissue swelling about the residual peripheral lower extremity. No soft tissue gas or unexpected radiopaque foreign object. No osseous destruction. IMPRESSION: Interval below the knee amputation, with nonspecific soft tissue edema. Electronically Signed   By: Abigail Miyamoto M.D.   On: 07/16/2015 18:27    ROS: As per HPI otherwise negative.    Physical Exam: Filed Vitals:   07/16/15 1530 07/17/15 0500 07/17/15 0512  BP: 136/72  111/57  Pulse: 84  86  Temp: 98.2 F (36.8 C)  98.4 F (36.9 C)  TempSrc: Oral  Oral  Resp: 14  16  Height: 5\' 9"  (1.753 m)    Weight: 71.5 kg (157 lb 10.1 oz) 72.7 kg (160 lb 4.4 oz)   SpO2: 99%  95%     General: Chronically ill appearing male in NAD. Head: Normocephalic, atraumatic, sclera non-icteric, mucus membranes are moist. L eye prosthesis.  Neck: Supple. JVD not elevated. Lungs: Clear bilaterally to auscultation without wheezes, rales, or rhonchi. Breathing is unlabored. Heart: RRR with S1 S2. No murmurs, rubs, or gallops appreciated. Abdomen: Soft, non-tender, non-distended with normoactive bowel sounds. No rebound/guarding. No obvious abdominal  masses. Lower extremities: L BKA with sutures still intact with slight edema along suture line. Concentric darkened area on patella. L. Knee is flexed. No RLE edema, darkened area on R heel.  Neuro: Alert and oriented X 3. Moves all extremities spontaneously. Psych:  Responds to questions appropriately with a normal affect. Dialysis Access: LUA AVF + thrill + bruit  Dialysis Orders: [facility] MWF TueThuSat, 4 hrs 0 min, 180NRe Optiflux, BFR 400, DFR Manual 800 mL/min, EDW 80 (kg), Dialysate 2.0 K, 2.0 Ca, UFR Profile: None, Sodium Model: None, Access: LUA AV Fistula Heparin: 7500 units per treatment MWF Mircera: 100 mcg IV q 2 weeks ( last dose 07/01/14) Venofer: 50 mg IV weekly (last dose 07/05/15)    Assessment/Plan: 1.  Abnormality of gait, post-operative pain secondary to left BKA: admitted for rehab. Per primary. 2.  ESRD -  TTS at [facility]. Will have HD after therapy sessions today.  3.  Hypertension/volume  - OP EDW needs to be adjusted for L BKA. Current wt 72.7. Will attempt UFG 1.5-2 liters. No OP antihypertensive medications.  4.  Anemia  - 9.9. Will give ESA and Fe today.  5.  Metabolic bone disease -  Continue OP binders. Phos 7.1. Ca 9.1 C Ca 10.14 6.  Nutrition - Albumin  2.7 Renal diet, add nepro/renal vits.  7.  DM: per primary   Rita H. Owens Shark, NP-C 07/17/2015, 12:11 PM  [employer] [phone]  Pt seen, examined and agree w A/P as above.  ESRD pt with left BKA done in [place], admitted to CIR here for intensive rehab.    Kelly Splinter MD [employer] pager [phone]    cell [phone] 07/17/2015, 4:47 PM

## 2015-07-17 NOTE — Care Management Note (Signed)
Inpatient Rehabilitation Center Individual Statement of Services  Patient Name:  John [name]  Date:  07/17/2015  Welcome to the [facility].  Our goal is to provide you with an individualized program based on your diagnosis and situation, designed to meet your specific needs.  With this comprehensive rehabilitation program, you will be expected to participate in at least 3 hours of rehabilitation therapies Monday-Friday, with modified therapy programming on the weekends.  Your rehabilitation program will include the following services:  Physical Therapy (PT), Occupational Therapy (OT), 24 hour per day rehabilitation nursing, Therapeutic Recreaction (TR), Case Management (Social Worker), Rehabilitation Medicine, Nutrition Services and Pharmacy Services  Weekly team conferences will be held on Wednesday to discuss your progress.  Your Social Worker will talk with you frequently to get your input and to update you on team discussions.  Team conferences with you and your family in attendance may also be held.  Expected length of stay: 7-10 days Overall anticipated outcome: mod/i wheelchair & supervision ambulation  Depending on your progress and recovery, your program may change. Your Social Worker will coordinate services and will keep you informed of any changes. Your Social Worker's name and contact numbers are listed  below.  The following services may also be recommended but are not provided by the [facility] will be made to provide these services after discharge if needed.  Arrangements include referral to agencies that provide these services.  Your insurance has been verified to be:  [employer]-Medicare & Medicaid Your primary doctor is:  [name]  Pertinent information will be shared with your doctor and your insurance company.  Social  Worker:  Ovidio Kin, [place] or ([phone]  Information discussed with and copy given to patient by: Elease Hashimoto, 07/17/2015, 10:44 AM

## 2015-07-17 NOTE — Progress Notes (Signed)
Social Work  Social Work Assessment and Plan  Patient Details  Name: John [name] MRN: [id number] Date of Birth: June 28, 1955  Today's Date: 07/17/2015  Problem List:  Patient Active Problem List   Diagnosis Date Noted  . Acute blood loss anemia   . Anemia of chronic disease   . Unilateral complete BKA ([place]) 07/16/2015  . Post-operative pain   . Abnormality of gait   . ESRD on dialysis ([place])   . Type 2 diabetes mellitus with complication, with long-term current use of insulin ([place])   . Left knee pain   . Diarrhea   . S/P transmetatarsal amputation of foot ([place]) 04/06/2015  . ESRD (end stage renal disease) ([place])   . Left foot pain 01/20/2015  . PAD (peripheral artery disease) ([place])   . Dry gangrene ([place])   . Cellulitis of left foot 01/19/2015  . Fall against object 07/03/2014  . Knee gives out 07/03/2014  . Left upper arm pain 03/20/2014  . Encounter for chronic pain management 03/10/2014  . Peripheral vascular disease, unspecified ([place]) 03/08/2014  . Aftercare following surgery of the circulatory system, [place] 03/08/2014  . Fecal incontinence 03/23/2013  . PVD (peripheral vascular disease) ([place]) 07/28/2012  . hand pain , steal syndrome LEFT 07/28/2012  . Mitral valve regurgitation 07/05/2012  . Hyperparathyroidism, secondary renal ([place]) 07/05/2012  . Anemia of renal disease 07/05/2012  . Cervical myelopathy ([place]) 07/05/2012  . Occlusion and stenosis of carotid artery without mention of cerebral infarction 12/03/2011  . Atherosclerosis of native arteries of the extremities with intermittent claudication 11/12/2011  . Right carotid bruit 11/12/2011  . Knee pain, left 08/08/2011  . COPD (chronic obstructive pulmonary disease) ([place]) 01/11/2011  . Backache 12/15/2008  . ESRD (end stage renal disease) on dialysis ([place]) 01/21/2008  . NEUROPATHY, DIABETIC 08/06/2006  . Diabetes mellitus with renal complications ([place]) [id number]  . HYPERLIPIDEMIA 08/06/2006  . TOBACCO DEPENDENCE  08/06/2006  . HYPERTENSION, BENIGN SYSTEMIC 08/06/2006  . 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 OSTEOARTHRITIS, LOWER LEG 08/06/2006  . History of cardiovascular disorder 08/06/2006   Past Medical History:  Past Medical History  Diagnosis Date  . COPD (chronic obstructive pulmonary disease) ([place])   . Chronic back pain     Chronic narcotics for this for many years  . Diabetes mellitus   . Hyperlipidemia   . Hypertension   . Foot pain   . Peripheral vascular disease ([place])   . Asthma   . Stroke ([place])     No residual "Mini strokes"  . Ulcer 1985  . Fracture of foot     [place]- Right  . Neuromuscular disorder ([place])     periferal neuropathy  . Chronic kidney disease     CKD stage III   Past Surgical History:  Past Surgical History  Procedure Laterality Date  . Neck surgery    . Eye surgery    . Cardiac catheterization    . Endarterectomy  12/18/2011    Procedure: ENDARTERECTOMY CAROTID;  Surgeon: Angelia Mould, MD;  Location: [facility];  Service: Vascular;  Laterality: Left;  . Carotid endarterectomy    . Axillary-femoral bypass graft  06/18/2012    Procedure: BYPASS GRAFT AXILLA-BIFEMORAL;  Surgeon: Angelia Mould, MD;  Location: [facility];  Service: Vascular;  Laterality: Right;  Right Axillo to Right Femoral Bypass Graft  . Femoral-popliteal bypass graft  06/18/2012    Procedure: BYPASS GRAFT FEMORAL-POPLITEAL ARTERY;  Surgeon: Angelia Mould, MD;  Location: [facility];  Service: Vascular;  Laterality: Right;  Right Femoral to Above Knee Popliteal Bypass Graft  . Amputation  06/18/2012    Procedure: AMPUTATION DIGIT;  Surgeon: Angelia Mould, MD;  Location: [facility] OR;  Service: Vascular;  Laterality: Right;  Right side, amputation of fourth and fifth toes  . Av fistula placement  06/24/2012    Procedure: ARTERIOVENOUS (AV) FISTULA CREATION;  Surgeon: Angelia Mould, MD;  Location: [place];  Service: Vascular;  Laterality: Left;  . Lower extremity angiogram Right  11/21/2011    Procedure: LOWER EXTREMITY ANGIOGRAM;  Surgeon: Elam Dutch, MD;  Location: [facility] CATH LAB;  Service: Cardiovascular;  Laterality: Right;  . Enucleation Left     trauma as a child. Wears prosthesis.    Social History:  reports that he has been smoking Cigarettes.  He has a 10 pack-year smoking history. He has never used smokeless tobacco. He reports that he does not drink alcohol or use illicit drugs.  Family / Support Systems Marital Status: Married Patient Roles: Spouse, Parent, Other (Comment) (sibling) Spouse/Significant Other: [name] [phone]  [phone] Children: Four step-daughter's Other Supports: Sister and grandson Anticipated Caregiver: Wife and sister Ability/Limitations of Caregiver: Wife has COPD and O2 dependent but according to pt gets around and can't sit still. Caregiver Availability: 24/7 Family Dynamics: Pt and wife have no children together, but wife has four daughter's whom are supportive and visit. Pt has a sister he can depend on and has some cousins. He feels they have limited but strong supports.   Social History Preferred language: English Religion: Baptist Cultural Background: No issues Education: [employer] Read: Yes Write: Yes Employment Status: Disabled Freight forwarder Issues: No issues Guardian/Conservator: None-according to MD pt is capable of making his own decisions while here. His wife wants to be included also   Abuse/Neglect Physical Abuse: Denies Verbal Abuse: Denies Sexual Abuse: Denies Exploitation of patient/patient's resources: Denies Self-Neglect: Denies  Emotional Status Pt's affect, behavior adn adjustment status: Pt is motivated and very up on his care and medications. He wants to get back to his independent level due to the pain issues he was experiencing limited his activity. He was using a wheelchair mostly at home due to pain in his leg. He will do what is needed he says to recover from  this. Recent Psychosocial Issues: Other medical issues-ESRD-HD T, TH, Sat Pyschiatric History: No history deferred depression screen at this time due to pt doing well and releived the pain is not as bad as prior to surgery. He can handle this. He also knows they and he tried to salvage his leg. Will monitor and have neuro-psych see while here if would be beneficial Substance Abuse History: Tobacco-still smokes some but has cut back a lot. He is aware he the MD would like him to quit but he is unsure is he can or wants too.  Patient / Family Perceptions, Expectations & Goals Pt/Family understanding of illness & functional limitations: Pt can explain his amputation and all of the procedures that lead up to it. He talks with the MD daily and surgeons and feel he has a good understanding of his treatment plan. He felt he was not getting what he needed form MD's in [place] so he went to [place] for them. Premorbid pt/family roles/activities: Husband, Step-father, retiree, brother, friend, cousin, etc Anticipated changes in roles/activities/participation: resume Pt/family expectations/goals: Pt states: " I want to be able to get around on my own when I leave here." Wife states: " I hope he  does well here and can be mobile."  [employer]: Other (Comment) (HD-T, TH, Sat) Premorbid Home Care/DME Agencies: Other (Comment) ([facility] in the past and trying to get PCS ) Transportation available at discharge: Sister, Wife, and grandson-pt drove prior to amputation Resource referrals recommended: Support group (specify)  Discharge Planning Living Arrangements: Spouse/significant other Support Systems: Spouse/significant other, Children, Other relatives, Friends/neighbors Type of Residence: Private residence Insurance Resources: [employer] (specify county), Multimedia programmer (specify) Primary school teacher) Financial Resources: SSD, Family Support Financial Screen Referred: Previously  completed Living Expenses: Own Money Management: Spouse, Patient Does the patient have any problems obtaining your medications?: No Home Management: Wife has a Physiological scientist who assists with home management, trying to get one for pt also Patient/Family Preliminary Plans: Return home with wife who is there but can not physically assist him. He will need to be mod/i wheelchair level at the very least. They have family support with transportation and home management. Pt is hoping ot get his own PCS and hospital bed prior to discharge from rehab. Will see where they are in the process. Social Work Anticipated Follow Up Needs: HH/OP, Support Group  Clinical Impression Pleasant motivated gentleman who is ready to get moving and recover from his amputation. He is relieved he is not having as much pain as he was prior to the surgery. His wife is supprotive but has health issues of her own and can Not physically assist him. She has a Physiological scientist who assists with the home management but working on getting one for pt also. Pt is also wanting a hospital bed for home. Pt may benefit from peer support will address while here. Will work on discharge plans, team conference.  Elease Hashimoto 07/17/2015, 12:19 PM

## 2015-07-17 NOTE — IPOC Note (Signed)
Overall Plan of Care [facility]) Patient Details Name: John [name] MRN: [id number] DOB: 1955-10-28  Admitting Diagnosis: BKA  Hospital Problems: Active Problems:   Unilateral complete BKA (HCC)   Post-operative pain   Abnormality of gait   ESRD on dialysis ([place])   Type 2 diabetes mellitus with complication, with long-term current use of insulin (HCC)   Left knee pain   Diarrhea   Acute blood loss anemia   Anemia of chronic disease     Functional Problem List: Nursing Bladder, Bowel, Edema, Endurance, Medication Management, Motor, Nutrition, Pain, Safety, Skin Integrity  PT Balance, Endurance, Motor, Pain, Skin Integrity, Sensory  OT Sensory, Endurance, Pain, Balance  SLP    TR         Basic ADL's: OT Bathing, Dressing, Toileting     Advanced  ADL's: OT Simple Meal Preparation     Transfers: PT Bed Mobility, Bed to Chair, Car, Manufacturing systems engineer, Metallurgist: PT Ambulation, Emergency planning/management officer, Stairs     Additional Impairments: OT    SLP        TR      Anticipated Outcomes Item Anticipated Outcome  Self Feeding Mod I  Swallowing      Basic self-care  Mod I  Toileting  Mod I   Bathroom Transfers Supervision  Bowel/Bladder  Able to manage bowel and bladder with min assist  Transfers  Mod I  Locomotion  Mod I w/c, supervision gait short distances  Communication     Cognition     Pain  Pain less than 4  Safety/Judgment  Pt will adhere to safety precautions and remain free of infection and skin breakdown with min assist   Therapy Plan: PT Intensity: Minimum of 1-2 x/day ,45 to 90 minutes PT Frequency: 5 out of 7 days PT Duration Estimated Length of Stay: 7-10 days OT Frequency: 5 out of 7 days OT Duration/Estimated Length of Stay: 7-10 days         Team Interventions: Nursing Interventions Patient/Family Education, Bladder Management, Bowel Management, Disease Management/Prevention, Pain Management, Medication Management, Skin  Care/Wound Management, Cognitive Remediation/Compensation, Discharge Planning, Psychosocial Support  PT interventions Ambulation/gait training, Training and development officer, Community reintegration, Discharge planning, Disease management/prevention, DME/adaptive equipment instruction, Functional mobility training, Neuromuscular re-education, Pain management, Patient/family education, Psychosocial support, Skin care/wound management, Splinting/orthotics, Stair training, Therapeutic Activities, Therapeutic Exercise, UE/LE Strength taining/ROM, Wheelchair propulsion/positioning  OT Interventions Training and development officer, Discharge planning, Functional mobility training, Pain management, Patient/family education, Therapeutic Exercise, Therapeutic Activities, Self Care/advanced ADL retraining, Wheelchair propulsion/positioning, DME/adaptive equipment instruction, UE/LE Strength taining/ROM  SLP Interventions    TR Interventions    SW/CM Interventions Discharge Planning, Psychosocial Support, Patient/Family Education    Team Discharge Planning: Destination: PT-Home ,OT- Home , SLP-  Projected Follow-up: PT-Home health PT, 24 hour supervision/assistance, OT-  Home health OT, SLP-  Projected Equipment Needs: PT-Other (comment), OT- To be determined, SLP-  Equipment Details: PT-amputee support pad for L limb for current w/c, OT-  Patient/family involved in discharge planning: PT- Patient,  OT-Patient, SLP-   MD ELOS: 7-10 days. Medical Rehab Prognosis:  Good Assessment: 60 year old male with history of COPD, chronic pian, ESRD, DM type 2 with peripheral neuropathy, SCI with ACDF, PAD with ongoing tobacco use s/p left transmetatarsal amputation due to refusal of BKA and SFA atherectomy. He had poor wound healing with necrosis, rest pain and edema and was agreeable to undergo L-BKA on 07/09/15 by Dr. Donnelly Angelica at [facility]. Plavix  resumed post op and left knee brace ordered to help keep LLE in  extension due to ongoing issues with spasms. Therapy initiated with education ongoing on desensitization to help with neuropathic pain. Fentanyl patch added to help with pain control and cymbalta added to help manage neuropathic pain.   See Team Conference Notes for weekly updates to the plan of care

## 2015-07-17 NOTE — Progress Notes (Signed)
Initial Nutrition Assessment  INTERVENTION:  -Continue Nepro Carb steady BID, 425 kcal, 19.1 kcal per bottle -Encourage PO intake  NUTRITION DIAGNOSIS:   Increased nutrient needs related to wound healing as evidenced by estimated needs.  GOAL:   Patient will meet greater than or equal to 90% of their needs  MONITOR:   PO intake, Supplement acceptance, Labs, Skin  REASON FOR ASSESSMENT:   Malnutrition Screening Tool  ASSESSMENT:   Pt with history of COPD, chronic pian, ESRD, DM type 2 with peripheral neuropathy, SCI with ACDF, PAD with ongoing tobacco use s/p left transmetatarsal amputation due to refusal of BKA and SFA atherectomy. He had poor wound healing with necrosis, rest pain and edema and was agreeable to undergo L-BKA  Pt seen for MST. Pt states he has been eating well, per chart he consumes 75% of meals. He is consuming his Nepro BID. PTA he's usual diet consist of two meals a day. He did state that he does not drink nepro or eat before dialysis treatments.   Pt states he does not know if he has lost weight. Per chart review he has lost 12% (21 lb) within a month, which is significant for time frame  Conducted nutrition focused physical exam, identified no fat or muscle wasting.  Medications reviewed. Labs reviewed; Phosphorus 7.1 mg/dL   Diet Order:  Diet renal with fluid restriction Fluid restriction:: 1200 mL Fluid; Room service appropriate?: Yes; Fluid consistency:: Thin  Skin:  Wound (see comment) (Incision closed, left leg)  Last BM:  07/15/2015  Height:   Ht Readings from Last 1 Encounters:  07/16/15 5\' 9"  (1.753 m)    Weight:   Wt Readings from Last 1 Encounters:  07/17/15 160 lb 4.4 oz (72.7 kg)    Ideal Body Weight:  67.9 kg (-6.5% for BKA )  BMI:  Body mass index is 23.66 kg/(m^2).  Estimated Nutritional Needs:   Kcal:  2200-2400  Protein:  110-120  Fluid:  1.2 L  EDUCATION NEEDS:   No education needs identified at this  time  [place], Dietetic Intern Pager: [phone]

## 2015-07-18 ENCOUNTER — Inpatient Hospital Stay (HOSPITAL_COMMUNITY): Payer: Medicare Other | Admitting: *Deleted

## 2015-07-18 ENCOUNTER — Inpatient Hospital Stay (HOSPITAL_COMMUNITY): Payer: Medicare Other | Admitting: Occupational Therapy

## 2015-07-18 ENCOUNTER — Inpatient Hospital Stay (HOSPITAL_COMMUNITY): Payer: Medicare Other

## 2015-07-18 ENCOUNTER — Inpatient Hospital Stay (HOSPITAL_COMMUNITY): Payer: Medicare Other | Admitting: Physical Therapy

## 2015-07-18 LAB — HEPATITIS B SURFACE ANTIBODY,QUALITATIVE: Hep B S Ab: REACTIVE

## 2015-07-18 LAB — HEPATITIS B SURFACE ANTIGEN: Hepatitis B Surface Ag: NEGATIVE

## 2015-07-18 LAB — GLUCOSE, CAPILLARY
Glucose-Capillary: 92 mg/dL (ref 65–99)
Glucose-Capillary: 103 mg/dL — ABNORMAL HIGH (ref 65–99)
Glucose-Capillary: 126 mg/dL — ABNORMAL HIGH (ref 65–99)

## 2015-07-18 LAB — HEPATITIS B CORE ANTIBODY, TOTAL: Hep B Core Total Ab: NEGATIVE

## 2015-07-18 NOTE — Progress Notes (Signed)
[facility] PHYSICAL MEDICINE & REHABILITATION     PROGRESS NOTE  Subjective/Complaints:  Pt noted to be sleeping with his knee flexed.  He states he is drained after HD yesterday.  Yesterday, he was to call to inquire about family having his knee immobilizer.  He states he did not do so, but will today.    ROS: +Constipation, left knee pain. Denies stump pain, CP, SOB, n/v/d.   Objective: Vital Signs: Blood pressure 110/56, pulse 85, temperature 98.5 F (36.9 C), temperature source Oral, resp. rate 18, height 5\' 9"  (1.753 m), weight 72.7 kg (160 lb 4.4 oz), SpO2 97 %. Dg Knee 1-2 Views Left  07/16/2015  CLINICAL DATA:  Rest pain and edema. Left below-the-knee amputation on 07/09/2015. EXAM: LEFT KNEE - 1-2 VIEW COMPARISON:  Knee radiographs of 08/31/2011 FINDINGS: Extensive vascular calcifications. Below-the-knee amputation. Soft tissue swelling about the residual peripheral lower extremity. No soft tissue gas or unexpected radiopaque foreign object. No osseous destruction. IMPRESSION: Interval below the knee amputation, with nonspecific soft tissue edema. Electronically Signed   By: Abigail Miyamoto M.D.   On: 07/16/2015 18:27    Recent Labs  07/17/15 0633 07/17/15 1700  WBC 8.5 8.5  HGB 9.9* 9.1*  HCT 32.1* 29.9*  PLT 191 224    Recent Labs  07/17/15 0633 07/17/15 1700  NA 137 137  K 4.4 4.4  CL 95* 96*  GLUCOSE 93 104*  BUN 59* 65*  CREATININE 10.57* 11.27*  CALCIUM 9.1 9.3   CBG (last 3)   Recent Labs  07/17/15 1150 07/17/15 2120 07/18/15 0646  GLUCAP 99 141* 92    Wt Readings from Last 3 Encounters:  07/18/15 72.7 kg (160 lb 4.4 oz)  06/13/15 82.419 kg (181 lb 11.2 oz)  01/19/15 86.4 kg (190 lb 7.6 oz)    Physical Exam:  BP 110/56 mmHg  Pulse 85  Temp(Src) 98.5 F (36.9 C) (Oral)  Resp 18  Ht 5\' 9"  (1.753 m)  Wt 72.7 kg (160 lb 4.4 oz)  BMI 23.66 kg/m2  SpO2 97% Constitutional: He appears well-developed and well-nourished.  NAD HENT: Atraumatic.   Mouth/Throat: Poor dentition  Eyes: Lids are normal. Left eye exhibits abnormal extraocular motion. 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  Cardiovascular: Normal rate and regular rhythm. Murmur heard. Respiratory: Effort normal and breath sounds normal. No respiratory distress. He exhibits no tenderness.  GI: Soft. Bowel sounds are normal. He exhibits no distension. There is no tenderness. There is no guarding.  Musculoskeletal: He exhibits edema (L-BKA with edema and sutures intact. Residual limb flexed with inability to fully extend.).  Fingers b/l atrophic and ischemic appearing on left.  Left knee with large ischemic vs DTI on patella that's tender to touch.  Knee with arthritic changes and tight hamstrings.  Right heel dark appearing.  Neurological: He is alert and oriented. Right exopthalmic.  He was able to follow simple motor commands without difficulty.  Motor: RUE: 5/5 LUE 4+/5 LLE: hip flexion 4/5 RLE: 4+/5 proximal to distal  Skin: Skin is warm and dry. He is not diaphoretic. Ischemic changes as noted  Psychiatric: His speech is tangential. Mood/affect normal.   Assessment/Plan: 1. Functional deficits secondary to left BKA which require 3+ hours per day of interdisciplinary therapy in a comprehensive inpatient rehab setting. Physiatrist is providing close team supervision and 24 hour management of active medical problems listed below. Physiatrist and rehab team continue to assess barriers to discharge/monitor patient progress toward functional and medical goals.  Function:  Bathing Bathing position   Position: Wheelchair/chair at sink  Bathing parts Body parts bathed by patient: Right arm, Left arm, Chest, Abdomen    Bathing assist Assist Level: Set up   Set up : To obtain items  Upper Body Dressing/Undressing Upper body dressing   What is the patient wearing?: Pull over shirt/dress     Pull over shirt/dress - Perfomed by patient: Put head through opening,  Thread/unthread right sleeve, Thread/unthread left sleeve Pull over shirt/dress - Perfomed by helper: Pull shirt over trunk        Upper body assist        Lower Body Dressing/Undressing Lower body dressing   What is the patient wearing?: Pants, Non-skid slipper socks     Pants- Performed by patient: Thread/unthread right pants leg Pants- Performed by helper: Pull pants up/down   Non-skid slipper socks- Performed by helper: Don/doff right sock                  Lower body assist Assist for lower body dressing: Touching or steadying assistance (Pt > 75%)      Toileting Toileting          Toileting assist     Transfers Chair/bed transfer   Chair/bed transfer method: Squat pivot Chair/bed transfer assist level: Supervision or verbal cues Chair/bed transfer assistive device: Armrests     Locomotion Ambulation     Max distance: 5 Assist level: Moderate assist (Pt 50 - 74%)   Wheelchair   Type: Manual Max wheelchair distance: 150 Assist Level: Supervision or verbal cues  Cognition Comprehension Comprehension assist level: Follows basic conversation/direction with extra time/assistive device  Expression Expression assist level: Expresses basic needs/ideas: With extra time/assistive device  Social Interaction Social Interaction assist level: Interacts appropriately with others - No medications needed.  Problem Solving Problem solving assist level: Solves basic 90% of the time/requires cueing < 10% of the time  Memory Memory assist level: Recognizes or recalls 75 - 89% of the time/requires cueing 10 - 24% of the time    Medical Problem List and Plan: 1. Abnormality of gait, post-operative pain secondary to left BKA on 1/30. Clean amputation daily with soap and water Monitor incision site for signs of infection or impending skin breakdown. Staples to remain in place for 3-4 weeks Stump shrinker, for edema control in future Scar mobilization massaging to prevent  soft tissue adherence Stump protector during therapies Prevent flexion contractures by implementing the following:   Encourage prone lying for 20-30 mins per day BID to avoid hip flexion contractures if medically appropriate;  Avoid pillow under knees when patient is lying in bed in order to prevent both  knee and hip flexion contractures;  Avoid prolonged sitting When using wheelchair, patient should have knee on amputated side fully extended with board under the seat cushion. Avoid injury to contralateral side 2. DVT Prophylaxis/Anticoagulation: Pharmaceutical: Lovenox 3. Pain Management: Fentanyl titrated to 25 mcg/ 72 hrs on 02/04--Continue fentanyl patch with prn oxycodone.  4. Mood: Team to provide ego support. LCSW to follow for evaluation and support.  5. Neuropsych: This patient is capable of making decisions on his own behalf. 6. Skin/Wound Care: Monitor wound daily.  7. Fluids/Electrolytes/Nutrition: Strict I/O. 1200 cc FR/daily 8. PAD: Continue ASA/plavix. 9. ESRD: On HD T, Th, Sa-schedule procedure after therapy session to help with activity tolerance during the day. Daily weights. Strict I/O.  10 DM type 2: Monitor BS with ac/hs checks. Continue SSI for tighter BS control. Continue to  hold lantus.   Fasting CBP 92 this AM 11. Left Knee pain: Xray reviewed, noting soft tissue swelling   Possible gout - will give one time dose of colchicine on 2/7 12. Bowel: Pt notes diarrhea and constipation at times.  Will cont to adjust meds 13. Acute on chronic anemia  Hb 9.1 on 2/7  Will cont to monitor  LOS (Days) 2 A FACE TO FACE EVALUATION WAS PERFORMED  Nikitta Sobiech Lorie Phenix 07/18/2015 8:55 AM

## 2015-07-18 NOTE — Progress Notes (Signed)
Social Work Patient ID: John [name], male   DOB: Jul 10, 1955, 60 y.o.   MRN: [id number] Met with pt to discuss team conference goals-mod/i level and supervision ambulation. With target discharge date 2/15. He feels he will be ready by then if not before. He states: " I'm getting sick of some of these people here."  He is trying to get a hold of his sister to see if she has his knee immoblizer for his amputation. He is also having a gout flare up. He plans to drive himself to HD due to he has his right leg and uses this one to drive. Encouraged him to discuss with the MD. He will see how he feels and let team know when he is ready for discharge if Sooner than 2/15.

## 2015-07-18 NOTE — Patient Care Conference (Signed)
Inpatient RehabilitationTeam Conference and Plan of Care Update Date: 07/18/2015   Time: 2:20 PM    Patient Name: John [name]      Medical Record Number: [id number]  Date of Birth: [birth year] Sex: Male         Room/Bed: 4W23C/4W23C-01 Payor Info: Payor: [employer] MEDICARE / Plan: UHC MEDICARE / Product Type: *No Product type* /    Admitting Diagnosis: BKA  Admit Date/Time:  07/16/2015  4:07 PM Admission Comments: No comment available   Primary Diagnosis:  <principal problem not specified> Principal Problem: <principal problem not specified>  Patient Active Problem List   Diagnosis Date Noted  . Acute blood loss anemia   . Anemia of chronic disease   . Unilateral complete BKA ([place]) 07/16/2015  . Post-operative pain   . Abnormality of gait   . ESRD on dialysis ([place])   . Type 2 diabetes mellitus with complication, with long-term current use of insulin ([place])   . Left knee pain   . Diarrhea   . S/P transmetatarsal amputation of foot ([place]) 04/06/2015  . ESRD (end stage renal disease) ([place])   . Left foot pain 01/20/2015  . PAD (peripheral artery disease) ([place])   . Dry gangrene ([place])   . Cellulitis of left foot 01/19/2015  . Fall against object 07/03/2014  . Knee gives out 07/03/2014  . Left upper arm pain 03/20/2014  . Encounter for chronic pain management 03/10/2014  . Peripheral vascular disease, unspecified ([place]) 03/08/2014  . Aftercare following surgery of the circulatory system, [place] 03/08/2014  . Fecal incontinence 03/23/2013  . PVD (peripheral vascular disease) ([place]) 07/28/2012  . hand pain , steal syndrome LEFT 07/28/2012  . Mitral valve regurgitation 07/05/2012  . Hyperparathyroidism, secondary renal ([place]) 07/05/2012  . Anemia of renal disease 07/05/2012  . Cervical myelopathy ([place]) 07/05/2012  . Occlusion and stenosis of carotid artery without mention of cerebral infarction 12/03/2011  . Atherosclerosis of native arteries of the extremities with intermittent  claudication 11/12/2011  . Right carotid bruit 11/12/2011  . Knee pain, left 08/08/2011  . COPD (chronic obstructive pulmonary disease) ([place]) 01/11/2011  . Backache 12/15/2008  . ESRD (end stage renal disease) on dialysis ([place]) 01/21/2008  . NEUROPATHY, DIABETIC 08/06/2006  . Diabetes mellitus with renal complications ([place]) [id number]  . HYPERLIPIDEMIA 08/06/2006  . TOBACCO DEPENDENCE 08/06/2006  . HYPERTENSION, BENIGN SYSTEMIC 08/06/2006  . 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 OSTEOARTHRITIS, LOWER LEG 08/06/2006  . History of cardiovascular disorder 08/06/2006    Expected Discharge Date: Expected Discharge Date: 07/25/15  Team Members Present: Physician leading conference: Dr. Delice Lesch Nurse Present: Dorien Chihuahua, RN PT Present: Jorge Mandril, PT OT Present: Willeen Cass, OT SLP Present: Windell Moulding, SLP PPS Coordinator present : Daiva Nakayama, RN, CRRN     Current Status/Progress Goal Weekly Team Focus  Medical   Abnormality of gait, post-operative pain secondary to left BKA on 1/30 with gout now  Improve gait, pain med adjustment  see above   Bowel/Bladder   Continent of bowel. LBM 07/15/15. HD-T, TH, Friday ([place])  Pt to remain continent  Monitor   Swallow/Nutrition/ Hydration     na        ADL's   Min-Mod A with BADL and transfers  Mod I with BADL, supervision with bath transfers  Pain managment/densitization of LLE, activity tolerance, BUE strengthening, transfers, adapted bathing/dressing skills using AE, prn   Mobility   Min-mod A bed mobility, transfers, ambulation short distances, supervision w/c mobility  Mod I  overall except supervision standing balance, gait, up/down ramp in w/c  LE ROM, strengthening, balance, transfers, gait   Communication     na        Safety/Cognition/ Behavioral Observations    no unsafe behaviors        Pain   Oxy IR 10mg  q 4hrs, Robaxin 500mg  q 6hrs  <  Monitor for effectiveness   Skin   Graft to LUE, ++, R foot w/ 3rd, 4th,  5th toes amputated. L BKA with staples, approximated. Skin tear to L lateral knee, dark area to anterior patella  No additional skin breakdown  Monitor L BKA for appropriate healing to skin tears and dark area on patella. Encourage turn q 2hrs       *See Care Plan and progress notes for long and short-term goals.  Barriers to Discharge: Pain, safety, anemia, gout    Possible Resolutions to Barriers:  Optimize meds, pt and family edu, monitor labs, encourage compliance.     Discharge Planning/Teaching Needs:  Home with wife who can provide 24 hr supervision due to her own health issues, pt doesn't want her to help him, he wants to be independent level before going home      Team Discussion:  Goals mod/i wheelchair level and supervision ambulation. Pain management with is amputation and now gout flare up. Working on knee extension-60 degrees, trying to get knee immoblizer from his sister. Spasms in leg during HD. Monitoring skin issues  Revisions to Treatment Plan:  None   Continued Need for Acute Rehabilitation Level of Care: The patient requires daily medical management by a physician with specialized training in physical medicine and rehabilitation for the following conditions: Daily direction of a multidisciplinary physical rehabilitation program to ensure safe treatment while eliciting the highest outcome that is of practical value to the patient.: Yes Daily medical management of patient stability for increased activity during participation in an intensive rehabilitation regime.: Yes Daily analysis of laboratory values and/or radiology reports with any subsequent need for medication adjustment of medical intervention for : Post surgical problems;Wound care problems;Renal problems  Elease Hashimoto 07/18/2015, 3:24 PM

## 2015-07-18 NOTE — Progress Notes (Signed)
[facility] Progress Note   Assessment/Plan: 1. Abnormality of gait, post-operative pain secondary to left BKA: admitted for rehab. Per primary. 2. ESRD - TTS at [facility] HD 02/07. Next tx tomorrow. K+4.4 3. Hypertension/volume -  HD 07/17/15. Post wt 70.3 kg (bed weight-needs hoyer wt). Wt today 72.7 kg. Will attempt UFG 2-2.5 liters tomorrow.  4. Anemia - 9.1. Will give ESA and Fe [id number].  5. Metabolic bone disease - Continue OP binders. Phos 7.2. Ca 9.1 C Ca 10.14. Has been refusing binders. Counseled.  6. Nutrition - Albumin 2.7 Renal diet, add nepro/renal vits.  7. DM: per primary  Rita H. Brown NP-C 07/18/2015, 2:31 PM  [facility] [phone]  Pt seen, examined and agree w A/P as above.  Kelly Splinter MD [facility] pager [phone]    cell [phone] 07/18/2015, 3:33 PM    Subjective: "I was in so much pain in dialysis yesterday-I never had anything like that". Up in chair, has been participating in therapy.  States that his BKA hurt entire time while on machine. No C/Os at present. Per nurse, patient has been refusing binders, attempting to refuse lantus. Discussed with pt importance of adherence to medical therapy.     Objective Filed Vitals:   07/17/15 1930 07/17/15 2000 07/17/15 2029 07/18/15 0504  BP: 111/51 121/61 124/40 110/56  Pulse: 95 97 92 85  Temp:    98.5 F (36.9 C)  TempSrc:    Oral  Resp: 22 20 18 18   Height:      Weight:   70.3 kg (154 lb 15.7 oz) 72.7 kg (160 lb 4.4 oz)  SpO2:   98% 97%   Physical Exam General: cooperative, NAD Heart: S1, S2, RRR No M/G/R Lungs: CTA AP Abdomen: soft nontender Extremities: L BKA, no RLE edema. Hands are darkened. 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 Dialysis Access: LUA AVF + thrill + bruit  Dialysis Orders: [facility] MWF TueThuSat, 4 hrs 0 min, 180NRe Optiflux, BFR 400, DFR Manual 800 mL/min, EDW 80 (kg), Dialysate 2.0 K, 2.0 Ca, UFR Profile: None, Sodium Model: None, Access:  LUA AV Fistula Heparin: 7500 units per treatment MWF Mircera: 100 mcg IV q 2 weeks ( last dose 07/01/14) Venofer: 50 mg IV weekly (last dose 07/05/15)  Additional Objective Labs: Basic Metabolic Panel:  Recent Labs Lab 07/17/15 0633 07/17/15 1700  NA 137 137  K 4.4 4.4  CL 95* 96*  CO2 26 24  GLUCOSE 93 104*  BUN 59* 65*  CREATININE 10.57* 11.27*  CALCIUM 9.1 9.3  PHOS 7.1* 7.2*   Liver Function Tests:  Recent Labs Lab 07/17/15 0633 07/17/15 1700  ALBUMIN 2.7* 2.9*   No results for input(s): LIPASE, AMYLASE in the last 168 hours. CBC:  Recent Labs Lab 07/17/15 0633 07/17/15 1700  WBC 8.5 8.5  HGB 9.9* 9.1*  HCT 32.1* 29.9*  MCV 88.9 87.9  PLT 191 224   Blood Culture    Component Value Date/Time   SDES BLOOD RIGHT HAND 01/19/2015 2228   SPECREQUEST BOTTLES DRAWN AEROBIC ONLY 5CC 01/19/2015 2228   CULT NO GROWTH 5 DAYS 01/19/2015 2228   REPTSTATUS 01/25/2015 FINAL 01/19/2015 2228    Cardiac Enzymes: No results for input(s): CKTOTAL, CKMB, CKMBINDEX, TROPONINI in the last 168 hours. CBG:  Recent Labs Lab 07/17/15 0653 07/17/15 1150 07/17/15 2120 07/18/15 0646 07/18/15 1159  GLUCAP 86 99 141* 92 103*   Iron Studies: No results for input(s): IRON, TIBC, TRANSFERRIN, FERRITIN in the last 72  hours. @lablastinr3 @ Studies/Results: Dg Knee 1-2 Views Left  07/16/2015  CLINICAL DATA:  Rest pain and edema. Left below-the-knee amputation on 07/09/2015. EXAM: LEFT KNEE - 1-2 VIEW COMPARISON:  Knee radiographs of 08/31/2011 FINDINGS: Extensive vascular calcifications. Below-the-knee amputation. Soft tissue swelling about the residual peripheral lower extremity. No soft tissue gas or unexpected radiopaque foreign object. No osseous destruction. IMPRESSION: Interval below the knee amputation, with nonspecific soft tissue edema. Electronically Signed   By: Abigail Miyamoto M.D.   On: 07/16/2015 18:27   Medications:   . aspirin EC  81 mg Oral Daily  . atorvastatin   40 mg Oral q1800  . clopidogrel  75 mg Oral Daily  . docusate sodium  100 mg Oral BID  . DULoxetine  20 mg Oral Daily  . enoxaparin (LOVENOX) injection  30 mg Subcutaneous Q24H  . feeding supplement (NEPRO CARB STEADY)  237 mL Oral BID BM  . fentaNYL  25 mcg Transdermal Q72H  . hydrocerin   Topical BID  . insulin aspart  0-5 Units Subcutaneous QHS  . insulin aspart  0-9 Units Subcutaneous TID WC  . insulin glargine  5 Units Subcutaneous Daily  . lanthanum  1,000 mg Oral TID WC  . multivitamin  1 tablet Oral QHS

## 2015-07-18 NOTE — Progress Notes (Signed)
Occupational Therapy Session Note  Patient Details  Name: John [name] MRN: [id number] Date of Birth: [birth year]  Today's Date: 07/18/2015 OT Individual Time: [id number] OT Individual Time Calculation (min): 73 min    Short Term Goals: Week 1:  OT Short Term Goal 1 (Week 1): STG = LTG d/t short LOS  Skilled Therapeutic Interventions/Progress Updates:    Pt resting in w/c upon arrival.  Pt declined bathing and dressing tasks this morning stating that he hasn't "done anything" to make it worthwhile to take a shower.  Discussed LTG's of OT. Pt verbalized understanding.  Pt propelled w/c to ADL apartment and engaged in w/c<>bed transfers X 3 and discharge planning for shower transfers.  Pt transitioned to gym and engaged in BUE therex on [employer] (Random at work load 3 for 10 mins). Pt transitioned to w/c mobility tasks/activities with focus on mobility in close quarters and cluttered environment.  Pt required steady A and mod verbal cues for transfers.    Therapy Documentation Precautions:  Precautions Precautions: Fall Restrictions Weight Bearing Restrictions: Yes LLE Weight Bearing: Non weight bearing  Pain: Pain Assessment Pain Assessment: No/denies pain Pain Score: 8  Pain Type: Surgical pain Pain Location: Leg Pain Orientation: Left Pain Descriptors / Indicators: Shooting;Sharp Pain Onset: On-going Patients Stated Pain Goal: 4 Pain Intervention(s): RN made aware;Repositioned;Other (Comment) (received meds from RN during therapy) Multiple Pain Sites: No  See Function Navigator for Current Functional Status.   Therapy/Group: Individual Therapy  Leroy Libman 07/18/2015, 12:11 PM

## 2015-07-18 NOTE — Plan of Care (Signed)
Problem: RH BLADDER ELIMINATION Goal: RH STG MANAGE BLADDER WITH ASSISTANCE STG Manage Bladder With Assistance min  Outcome: Not Applicable Date Met:  76/73/41 HD T,TH,S

## 2015-07-18 NOTE — Progress Notes (Signed)
Physical Therapy Session Note  Patient Details  Name: John [name] MRN: [id number] Date of Birth: 02-09-1956  Today's Date: 07/18/2015 PT Individual Time: 0815-0900 PT Individual Time Calculation (min): 45 min   Short Term Goals: Week 1:  PT Short Term Goal 1 (Week 1): = LTG due to short LOS  Skilled Therapeutic Interventions/Progress Updates:    Pt received supine in bed, c/o 10/10 pain in L residual as described below. Missed 15 min due to pt receiving medications and eating breakfast. Supine>sit with bedrails and S. Transfer bed>w/c with S and pt declining to raise armrest despite education for safety. W/c propulsion to gym x150' with S. Impaired fine motor control noted when pt attempting to unlock door and tie pajama pants, requiring assistance, likely due to peripheral neuropathy. Transfer w/c<>mat table with S and pt agreeable to try with arm rest removed. Sit <>supine and rolling supine<>prone x3 each to position in prone on mat table. Performed static PROM with 7# weight on distal residual limb for hamstring stretch, hip flexor stretch. Educated pt in benefits and importance of prone lying for prevention of muscle contracture. Straight leg hip extension 2x15 on RLE, 2x10 LLE AAROM due to compensatory trunk rotation. 1 x10 hamstring curl LLE with external stabilization at pelvis to prevent hip flexion, facilitate hip flexor stretch via reciprocal inhibition. All movement slow and requires increased time due to pain. Returned to room with w/c propulsion with S. Remained seated in w/c, all needs in reach at completion of session.   Therapy Documentation Precautions:  Precautions Precautions: Fall Restrictions Weight Bearing Restrictions: Yes LLE Weight Bearing: Non weight bearing General: PT Amount of Missed Time (min): 15 Minutes PT Missed Treatment Reason:  (medication and breakfast) Pain: Pain Assessment Pain Assessment: 0-10 Pain Score: 10-Worst pain ever Pain Type: Surgical  pain Pain Location: Leg Pain Orientation: Left Pain Descriptors / Indicators: Shooting;Sharp Pain Frequency: Intermittent Pain Onset: On-going Patients Stated Pain Goal: 4 Pain Intervention(s): Other (Comment) (pre-medicated) Multiple Pain Sites: No   See Function Navigator for Current Functional Status.   Therapy/Group: Individual Therapy  Luberta Mutter 07/18/2015, 9:01 AM

## 2015-07-18 NOTE — Progress Notes (Signed)
Occupational Therapy Session Note  Patient Details  Name: John [name] MRN: [id number] Date of Birth: 1956-04-10  Today's Date: 07/18/2015 OT Individual Time: 1300-1400 OT Individual Time Calculation (min): 60 min    Skilled Therapeutic Interventions/Progress Updates:    1:1. Engaged in d/c discussion and home setup.  Pt reported his elderly mother had come to stay with pt and he made made adjustments to the home but wasn't sure his bathroom can accommodate a w/c through the doorway. Went into the bathroom to further discuss shower stall setup at home and equipment. Pt reports having a bench he can use over the ledge to enter in and leave the door open.  Transitioned to the gym and practiced hopping forwards and backwards over a walking stick- simulating some threshold in the bathroom. Pt able to clear it forwards and backwards with more than reasonable amt of time and with min A. Issued an inspection mirror to look at residual limb.  Doff wrapping - noticed back of knee red and with some discoloration - MD made aware. Ace wrap may have been too tight adjusted it. Discussed with MD about shrinker instead of ace wrap.   Pt transferred to the toilet with supervision but required max A to pull clothing down for toileting. Left on toilet with RN.   Therapy Documentation Precautions:  Precautions Precautions: Fall Restrictions Weight Bearing Restrictions: Yes LLE Weight Bearing: Non weight bearing Pain: Pain Assessment Pain Assessment: No/denies pain Pain Score: 8  Pain Type: Surgical pain Pain Location: Leg Pain Orientation: Left Pain Descriptors / Indicators: Shooting;Sharp Pain Onset: On-going Pain Intervention(s): RN made aware;Repositioned;Other (Comment) (received meds from RN during therapy) ADL: ADL ADL Comments: see Functional Assessment Tool  See Function Navigator for Current Functional Status.   Therapy/Group: Individual Therapy  [name], [name] [facility] 07/18/2015, 3:49  PM

## 2015-07-19 ENCOUNTER — Inpatient Hospital Stay (HOSPITAL_COMMUNITY): Payer: Medicare Other | Admitting: Physical Therapy

## 2015-07-19 ENCOUNTER — Inpatient Hospital Stay (HOSPITAL_COMMUNITY): Payer: Medicare Other

## 2015-07-19 LAB — CBC
HCT: 30 % — ABNORMAL LOW (ref 39.0–52.0)
Hemoglobin: 9.3 g/dL — ABNORMAL LOW (ref 13.0–17.0)
MCH: 28.1 pg (ref 26.0–34.0)
MCHC: 31 g/dL (ref 30.0–36.0)
MCV: 90.6 fL (ref 78.0–100.0)
Platelets: 212 K/uL (ref 150–400)
RBC: 3.31 MIL/uL — ABNORMAL LOW (ref 4.22–5.81)
RDW: 17.7 % — ABNORMAL HIGH (ref 11.5–15.5)
WBC: 8 10*3/uL (ref 4.0–10.5)

## 2015-07-19 LAB — GLUCOSE, CAPILLARY
Glucose-Capillary: 112 mg/dL — ABNORMAL HIGH (ref 65–99)
Glucose-Capillary: 79 mg/dL (ref 65–99)
Glucose-Capillary: 105 mg/dL — ABNORMAL HIGH (ref 65–99)
Glucose-Capillary: 78 mg/dL (ref 65–99)

## 2015-07-19 LAB — RENAL FUNCTION PANEL
Albumin: 3 g/dL — ABNORMAL LOW (ref 3.5–5.0)
Anion gap: 18 — ABNORMAL HIGH (ref 5–15)
BUN: 55 mg/dL — ABNORMAL HIGH (ref 6–20)
CO2: 26 mmol/L (ref 22–32)
Calcium: 9 mg/dL (ref 8.9–10.3)
Chloride: 93 mmol/L — ABNORMAL LOW (ref 101–111)
Creatinine, Ser: 9.78 mg/dL — ABNORMAL HIGH (ref 0.61–1.24)
GFR calc Af Amer: 6 mL/min — ABNORMAL LOW (ref >60)
GFR calc non Af Amer: 5 mL/min — ABNORMAL LOW (ref >60)
Glucose, Bld: 86 mg/dL (ref 65–99)
Phosphorus: 6.5 mg/dL — ABNORMAL HIGH (ref 2.5–4.6)
Potassium: 4 mmol/L (ref 3.5–5.1)
Sodium: 137 mmol/L (ref 135–145)

## 2015-07-19 MED ORDER — SODIUM CHLORIDE 0.9 % IV SOLN: 100.0000 mL | INTRAVENOUS | Status: DC | PRN

## 2015-07-19 MED ORDER — DARBEPOETIN ALFA 150 MCG/0.3ML IJ SOSY
PREFILLED_SYRINGE | INTRAMUSCULAR | Status: AC
  Administered 2015-07-19: 150 ug via INTRAVENOUS
  Filled 2015-07-19: qty 0.3

## 2015-07-19 MED ORDER — METHOCARBAMOL 500 MG PO TABS
500.0000 mg | ORAL_TABLET | Freq: Four times a day (QID) | ORAL | Status: DC
  Administered 2015-07-19 – 2015-07-24 (×19): 500 mg via ORAL
  Filled 2015-07-19 (×19): qty 1

## 2015-07-19 MED ORDER — LIDOCAINE-PRILOCAINE 2.5-2.5 % EX CREA
1.0000 "application " | TOPICAL_CREAM | CUTANEOUS | Status: DC | PRN
  Filled 2015-07-19: qty 5

## 2015-07-19 MED ORDER — OXYCODONE-ACETAMINOPHEN 5-325 MG PO TABS
ORAL_TABLET | ORAL | Status: AC
  Administered 2015-07-19: 2 via ORAL
  Filled 2015-07-19: qty 2

## 2015-07-19 MED ORDER — PENTAFLUOROPROP-TETRAFLUOROETH EX AERO: 1.0000 "application " | INHALATION_SPRAY | CUTANEOUS | Status: DC | PRN

## 2015-07-19 MED ORDER — NA FERRIC GLUC CPLX IN SUCROSE 12.5 MG/ML IV SOLN
62.5000 mg | INTRAVENOUS | Status: DC
  Administered 2015-07-19: 62.5 mg via INTRAVENOUS
  Filled 2015-07-19 (×3): qty 5

## 2015-07-19 MED ORDER — DARBEPOETIN ALFA 150 MCG/0.3ML IJ SOSY
150.0000 ug | PREFILLED_SYRINGE | INTRAMUSCULAR | Status: DC
  Administered 2015-07-19: 150 ug via INTRAVENOUS
  Filled 2015-07-19: qty 0.3

## 2015-07-19 MED ORDER — LIDOCAINE HCL (PF) 1 % IJ SOLN
5.0000 mL | INTRAMUSCULAR | Status: DC | PRN
  Filled 2015-07-19: qty 5

## 2015-07-19 NOTE — Progress Notes (Signed)
Physician notified of patient writhing in pain.  Treatment terminated early, per physician order.  Will continue to monitor.

## 2015-07-19 NOTE — Progress Notes (Signed)
[place] PHYSICAL MEDICINE & REHABILITATION     PROGRESS NOTE  Subjective/Complaints:  Pt seen laying in bed.  He states his knee feels a lot better, but continues to have tightness in his hamstrings.    ROS:  Denies stump pain, CP, SOB, n/v/d.   Objective: Vital Signs: Blood pressure 123/63, pulse 79, temperature 98.5 F (36.9 C), temperature source Oral, resp. rate 18, height 5\' 9"  (1.753 m), weight 73.1 kg (161 lb 2.5 oz), SpO2 100 %. No results found.  Recent Labs  07/17/15 0633 07/17/15 1700  WBC 8.5 8.5  HGB 9.9* 9.1*  HCT 32.1* 29.9*  PLT 191 224    Recent Labs  07/17/15 0633 07/17/15 1700  NA 137 137  K 4.4 4.4  CL 95* 96*  GLUCOSE 93 104*  BUN 59* 65*  CREATININE 10.57* 11.27*  CALCIUM 9.1 9.3   CBG (last 3)   Recent Labs  07/18/15 1636 07/18/15 2126 07/19/15 0647  GLUCAP 126* 112* 79    Wt Readings from Last 3 Encounters:  07/19/15 73.1 kg (161 lb 2.5 oz)  06/13/15 82.419 kg (181 lb 11.2 oz)  01/19/15 86.4 kg (190 lb 7.6 oz)    Physical Exam:  BP 123/63 mmHg  Pulse 79  Temp(Src) 98.5 F (36.9 C) (Oral)  Resp 18  Ht 5\' 9"  (1.753 m)  Wt 73.1 kg (161 lb 2.5 oz)  BMI 23.79 kg/m2  SpO2 100% Constitutional: He appears well-developed and well-nourished.  NAD HENT: Atraumatic.  Mouth/Throat: Poor dentition  Eyes: Lids are normal. Left eye exhibits abnormal extraocular motion. 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  Cardiovascular: Normal rate and regular rhythm. Murmur heard. Respiratory: Effort normal and breath sounds normal. No respiratory distress. He exhibits no tenderness.  GI: Soft. Bowel sounds are normal. He exhibits no distension. There is no tenderness. There is no guarding.  Musculoskeletal: He exhibits mild edema (L-BKA with edema and sutures intact. Residual limb flexed with inability to fully extend.).  Fingers b/l atrophic and ischemic appearing on left.  Left knee with DTI on patella (improving).  Knee with arthritic changes  and tight hamstrings.  Neurological: He is alert and oriented. Right exopthalmic.  He was able to follow simple motor commands without difficulty.  Motor: RUE: 5/5 LUE 4+/5 LLE: hip flexion 4/5 RLE: 4+/5 proximal to distal  Skin: Skin is warm and dry. He is not diaphoretic. Ischemic changes as noted  Psychiatric: His speech is tangential. Mood/affect normal.   Assessment/Plan: 1. Functional deficits secondary to left BKA which require 3+ hours per day of interdisciplinary therapy in a comprehensive inpatient rehab setting. Physiatrist is providing close team supervision and 24 hour management of active medical problems listed below. Physiatrist and rehab team continue to assess barriers to discharge/monitor patient progress toward functional and medical goals.  Function:  Bathing Bathing position   Position: Wheelchair/chair at sink  Bathing parts Body parts bathed by patient: Right arm, Left arm, Chest, Abdomen    Bathing assist Assist Level: Set up   Set up : To obtain items  Upper Body Dressing/Undressing Upper body dressing   What is the patient wearing?: Pull over shirt/dress     Pull over shirt/dress - Perfomed by patient: Put head through opening, Thread/unthread right sleeve, Thread/unthread left sleeve Pull over shirt/dress - Perfomed by helper: Pull shirt over trunk        Upper body assist        Lower Body Dressing/Undressing Lower body dressing   What is the  patient wearing?: Pants, Non-skid slipper socks     Pants- Performed by patient: Thread/unthread right pants leg Pants- Performed by helper: Pull pants up/down   Non-skid slipper socks- Performed by helper: Don/doff right sock                  Lower body assist Assist for lower body dressing: Touching or steadying assistance (Pt > 75%)      Toileting Toileting          Toileting assist     Transfers Chair/bed transfer   Chair/bed transfer method: Squat pivot Chair/bed transfer  assist level: Supervision or verbal cues Chair/bed transfer assistive device: Armrests     Locomotion Ambulation     Max distance: 5 Assist level: Moderate assist (Pt 50 - 74%)   Wheelchair   Type: Manual Max wheelchair distance: 150 Assist Level: Supervision or verbal cues  Cognition Comprehension Comprehension assist level: Follows basic conversation/direction with extra time/assistive device  Expression Expression assist level: Expresses basic needs/ideas: With extra time/assistive device  Social Interaction Social Interaction assist level: Interacts appropriately with others - No medications needed.  Problem Solving Problem solving assist level: Solves basic 90% of the time/requires cueing < 10% of the time  Memory Memory assist level: Recognizes or recalls 75 - 89% of the time/requires cueing 10 - 24% of the time    Medical Problem List and Plan: 1. Abnormality of gait, post-operative pain secondary to left BKA on 1/30. Clean amputation daily with soap and water Monitor incision site for signs of infection or impending skin breakdown. Staples to remain in place for 3-4 weeks Stump shrinker, for edema control Scar mobilization massaging to prevent soft tissue adherence Stump protector during therapies Prevent flexion contractures by implementing the following:   Encourage prone lying for 20-30 mins per day BID to avoid hip flexion contractures if medically appropriate;  Avoid pillow under knees when patient is lying in bed in order to prevent both  knee and hip flexion contractures;  Avoid prolonged sitting When using wheelchair, patient should have knee on amputated side fully extended with board under the seat cushion. Avoid injury to contralateral side 2. DVT Prophylaxis/Anticoagulation: Pharmaceutical: Lovenox 3. Pain Management: Fentanyl titrated to 25 mcg/ 72 hrs on 02/04--Continue fentanyl patch with prn oxycodone.  4. Mood: Team to provide ego support. LCSW to  follow for evaluation and support.  5. Neuropsych: This patient is capable of making decisions on his own behalf. 6. Skin/Wound Care: Monitor wound daily.  7. Fluids/Electrolytes/Nutrition: Strict I/O. 1200 cc FR/daily 8. PAD: Continue ASA/plavix. 9. ESRD: On HD T, Th, Sa-schedule procedure after therapy session to help with activity tolerance during the day. Daily weights. Strict I/O.  10 DM type 2: Monitor BS with ac/hs checks. Continue SSI for tighter BS control. Continue to hold lantus.   Fasting CBP 79 this AM 11. 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 Bowel: Pt notes diarrhea and constipation at times.    Stable at present 13. Acute on chronic anemia  Hb 9.1 on 2/7  Will cont to monitor  LOS (Days) 3 A FACE TO FACE EVALUATION WAS PERFORMED  Mykeal Carrick Lorie Phenix 07/19/2015 8:16 AM

## 2015-07-19 NOTE — Progress Notes (Signed)
Occupational Therapy Session Note  Patient Details  Name: John [name] MRN: [id number] Date of Birth: Jun 06, 1956  Today's Date: 07/19/2015 OT Individual Time: 1000-1100 OT Individual Time Calculation (min): 60 min    Short Term Goals: Week 1:  OT Short Term Goal 1 (Week 1): STG = LTG d/t short LOS  Skilled Therapeutic Interventions/Progress Updates: ADL-retraining at shower level with focus on dynamic standing balance and pain management.   Pt received seated in his w/c complaining of 12/10 pain after therapy.  OT re-educated pt on benefit of shower and pt agreed to proceed with BADL using shower to reduce discomfort.  Pt required only setup to place w/c and gather clothing, with steadying assist to secure w/c as pt completed transfer in/out of shower.  Pt dressed in w/c placed at sink and performed partial standing to pull up brief and pants.  Pt demonstrates excellent safety awareness and insights relating to adapted bathing/dressing skills however he continues to require setup and supervision to maintain left knee in extension as much as possible.     Therapy Documentation Precautions:  Precautions Precautions: Fall Restrictions Weight Bearing Restrictions: Yes LLE Weight Bearing: Non weight bearing   Pain: Pain Assessment Pain Assessment: 0-10 Pain Score: 10-Worst pain ever Pain Type: Surgical pain Pain Location: Leg Pain Orientation: Left Pain Descriptors / Indicators: Jabbing Pain Onset: Gradual Patients Stated Pain Goal: 4 Pain Intervention(s): Medication (See eMAR) Multiple Pain Sites: No   ADL: ADL ADL Comments: see Functional Assessment Tool  See Function Navigator for Current Functional Status.   Therapy/Group: Individual Therapy  [place] 07/19/2015, 12:43 PM

## 2015-07-19 NOTE — Progress Notes (Signed)
Physical Therapy Session Note  Patient Details  Name: John [name] MRN: [id number] Date of Birth: 12-Sep-1955  Today's Date: 07/19/2015 PT Individual Time: 0902-1000 PT Individual Time Calculation (min): 58 min   Short Term Goals: Week 1:  PT Short Term Goal 1 (Week 1): = LTG due to short LOS  Skilled Therapeutic Interventions/Progress Updates:    Reviewed handout for HEP that was given by prior PT - pt denies any questions. Functional transfer training during session with squat pivot technique with supervision to steady assist and verbal cues for efficient set-up. Sit to stands throughout session with min assist and verbal cues for hand placement and technique - cues especially for eccentric control. Dynamic standing balance activity to address functional reaching to L side with R and L UE and then tossing to horseshoe for target with supervision to min assist initially and mod assist as fatigued (increased difficulty when using LUE). Gait with RW x 10' with min assist initially and up to mod assist as fatigued with cues for effective use of UE's to clear fot. W/c mobility training and parts management on unit. Handoff to OT at end of session.  Therapy Documentation Precautions:  Precautions Precautions: Fall Restrictions Weight Bearing Restrictions: Yes LLE Weight Bearing: Non weight bearing  Pain: Pain Assessment Pain Assessment: 0-10 Pain Score: 7  Pain Type: Surgical pain;Acute pain Pain Location: Leg Pain Orientation: Left Pain Descriptors / Indicators: Aching;Stabbing Pain Onset: On-going Patients Stated Pain Goal: 4 Pain Intervention(s): Other (Comment) (pre-medicated) Multiple Pain Sites: No   See Function Navigator for Current Functional Status.   Therapy/Group: Individual Therapy  Canary Brim Ivory Broad, PT, DPT  07/19/2015, 12:10 PM

## 2015-07-19 NOTE — Progress Notes (Signed)
Physical Therapy Session Note  Patient Details  Name: John [name] MRN: [id number] Date of Birth: [birth year]  Today's Date: 07/19/2015 PT Individual Time: 0800-0830 PT Individual Time Calculation (min): 30 min   Short Term Goals: Week 1:  PT Short Term Goal 1 (Week 1): = LTG due to short LOS  Skilled Therapeutic Interventions/Progress Updates:    Pt received supine in bed, c/o residual limb pain as described below and agreeable to treatment. Pt declines wrapping LLE despite education on importance of limb wrapping, independence upon returning home. Pt given handouts with LE strengthening exercises for post-amputation, and educational handouts with information about phantom limb pain and positioning in bed, w/c to maintain muscle length. Educated on importance of performing exercises regularly in room outside of normal therapy sessions. Performed 1 x10 of LE exercises on LLE including long arc quad, glute sets, straight leg raise, bridging. Increased time required due to pain with repositioning, movement. Pt given instruction to perform exercises on BLE, however only performed on LLE due to time constraints. Remained supine in bed at completion of session, alarm intact and all needs within reach.   Therapy Documentation Precautions:  Precautions Precautions: Fall Restrictions Weight Bearing Restrictions: Yes LLE Weight Bearing: Non weight bearing Pain: Pain Assessment Pain Assessment: 0-10 Pain Score: 7  Pain Type: Surgical pain;Acute pain Pain Location: Leg Pain Orientation: Left Pain Descriptors / Indicators: Aching;Stabbing Pain Onset: On-going Patients Stated Pain Goal: 4 Pain Intervention(s): Other (Comment) (pre-medicated) Multiple Pain Sites: No   See Function Navigator for Current Functional Status.   Therapy/Group: Individual Therapy  Luberta Mutter 07/19/2015, 8:59 AM

## 2015-07-19 NOTE — Progress Notes (Signed)
Patient arrived to unit per bed.  Reviewed treatment plan and this RN agrees.  Report received from bedside RN, Nastassia.  Consent verified.  Patient A & O X 4. Lung sounds clear to ausculation in all fields. No edema. Cardiac: Regular R&R.  Prepped LUAVG with alcohol and cannulated with two 15 gauge needles.  Pulsation of blood noted.  Flushed access well with saline per protocol.  Connected and secured lines and initiated tx at 1630.  UF goal of 2500 mL and net fluid removal of 2000 mL.  Will continue to monitor.

## 2015-07-19 NOTE — Progress Notes (Signed)
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  UF adjusted for possible dialysis related cramps.  Patient reported no change in pain.  One hour left to dialyze.  This RN asked patient if he wanted to continue treatment, patient responded that he does want to continue.  Will continue to monitor.

## 2015-07-19 NOTE — Progress Notes (Signed)
Dialysis treatment completed.  1400 mL ultrafiltrated and net fluid removal 900 mL.    Patient status unchanged, complaining of intense pain at L BKA site. Lung sounds clear to ausculation in all fields. No edema. Cardiac: Regular R&R.  Disconnected lines and removed needles.  Pressure held for 10 minutes and band aid/gauze dressing applied.  Report given to bedside RN, Jarrett Soho.

## 2015-07-19 NOTE — Progress Notes (Signed)
Orthopedic Tech Progress Note Patient Details:  YU DEARMENT 08-11-1955 [id number]  Patient ID: John [name], male   DOB: 09-05-1955, 60 y.o.   MRN: [id number] Called in advanced brace order; spoke with Cletis Athens, Curly Mackowski 07/19/2015, 9:47 AM

## 2015-07-19 NOTE — Progress Notes (Signed)
Physical Therapy Session Note  Patient Details  Name: John [name] MRN: [id number] Date of Birth: 10/10/1955  Today's Date: 07/19/2015  [id number] PT Individual Time Calculation (min): 43 min  Short Term Goals: Week 1:  PT Short Term Goal 1 (Week 1): = LTG due to short LOS  Skilled Therapeutic Interventions/Progress Updates:  neuromuscular re-education via forced use for prolonged L hamstring stretch in sitting, with 7.5# weight on knee; pt tolerated x 30 minutes. W/c propulsion on level tile and up/down 8% grade in hallway to N. Tower, with supervision ascending and descending.  Pt reported that he used his wheel locks previously when descending a slope in his driveway.  PT educated pt on use of bil hands for braking on wheel rims, and dangers of using wheel locks for this.  Pt verbalized and demonstrated understanding.    Pt returned to room and left resting in w/c with all needs within reach.    Therapy Documentation Precautions:  Precautions Precautions: Fall Restrictions Weight Bearing Restrictions: Yes LLE Weight Bearing: Non weight bearing Pain: Pain Assessment Pain Score: 8  Pain Type: Surgical pain Pain Location: Leg Pain Orientation: Left Pain Onset: Gradual Patients Stated Pain Goal: 4 Pain Intervention(s): Medication (See eMAR)      See Function Navigator for Current Functional Status.   Therapy/Group: Individual Therapy  Marvelene Stoneberg 07/19/2015, 4:52 PM

## 2015-07-19 NOTE — Progress Notes (Signed)
Pt assessed R/T hypotension.  Pt stated to this RN that he runs in the 80's and 70's while dialyzing outpatient.  UG goal left at 1534mL.  Pt instructed to inform RN if he feels hypotensive.  Will continue to monitor.

## 2015-07-20 ENCOUNTER — Inpatient Hospital Stay (HOSPITAL_COMMUNITY): Payer: Medicare Other | Admitting: Occupational Therapy

## 2015-07-20 ENCOUNTER — Inpatient Hospital Stay (HOSPITAL_COMMUNITY): Payer: Medicare Other

## 2015-07-20 ENCOUNTER — Inpatient Hospital Stay (HOSPITAL_COMMUNITY): Payer: Medicare Other | Admitting: Physical Therapy

## 2015-07-20 DIAGNOSIS — R208 Other disturbances of skin sensation: Secondary | ICD-10-CM

## 2015-07-20 LAB — GLUCOSE, CAPILLARY
GLUCOSE-CAPILLARY: 107 mg/dL — AB (ref 65–99)
GLUCOSE-CAPILLARY: 111 mg/dL — AB (ref 65–99)
GLUCOSE-CAPILLARY: 106 mg/dL — AB (ref 65–99)
Glucose-Capillary: 72 mg/dL (ref 65–99)

## 2015-07-20 NOTE — Progress Notes (Signed)
[facility] PHYSICAL MEDICINE & REHABILITATION     PROGRESS [name]  Subjective/Complaints:  Patient seen sitting up in bed about to work with therapies. He states he is doing fairly well. However, informed by nursing of left hand pain and left leg pain during dialysis. When asked patient about this, patient states that he did have intense pain during dialysis causing him to end the session slightly early.  ROS:  Denies stump pain, CP, SOB, n/v/d.   Objective: Vital Signs: Blood pressure 140/68, pulse 86, temperature 98.7 F (37.1 C), temperature source Oral, resp. rate 18, height 5\' 9"  (1.753 m), weight 73.1 kg (161 lb 2.5 oz), SpO2 98 %. No results found.  Recent Labs  07/17/15 1700 07/19/15 1705  WBC 8.5 8.0  HGB 9.1* 9.3*  HCT 29.9* 30.0*  PLT 224 212    Recent Labs  07/17/15 1700 07/19/15 1704  NA 137 137  K 4.4 4.0  CL 96* 93*  GLUCOSE 104* 86  BUN 65* 55*  CREATININE 11.27* 9.78*  CALCIUM 9.3 9.0   CBG (last 3)   Recent Labs  07/19/15 1203 07/19/15 2037 07/20/15 0639  GLUCAP 105* 78 72    Wt Readings from Last 3 Encounters:  07/20/15 73.1 kg (161 lb 2.5 oz)  06/13/15 82.419 kg (181 lb 11.2 oz)  01/19/15 86.4 kg (190 lb 7.6 oz)    Physical Exam:  BP 140/68 mmHg  Pulse 86  Temp(Src) 98.7 F (37.1 C) (Oral)  Resp 18  Ht 5\' 9"  (1.753 m)  Wt 73.1 kg (161 lb 2.5 oz)  BMI 23.79 kg/m2  SpO2 98% Constitutional: He appears well-developed and well-nourished.  NAD HENT: Atraumatic.  Mouth/Throat: Poor dentition  Eyes: Lids are normal. Left eye exhibits abnormal extraocular motion. 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  Cardiovascular: Normal rate and regular rhythm. Murmur heard. Respiratory: Effort normal and breath sounds normal. No respiratory distress. He exhibits no tenderness.  GI: Soft. Bowel sounds are normal. He exhibits no distension. There is no tenderness. There is no guarding.  Musculoskeletal: He exhibits no edema  L-BKA with edema and sutures  intact. Residual limb flexed with inability to fully extend.  Fingers b/l atrophic and ischemic appearing on left.  Left knee with DTI on patella (improving).  Knee with arthritic changes and tight hamstrings.  Neurological: He is alert and oriented. Right exopthalmic.  He was able to follow simple motor commands without difficulty.  Motor: RUE: 5/5 LUE 4+/5 LLE: hip flexion 4/5 RLE: 4+/5 proximal to distal  Skin: Skin is warm and dry. He is not diaphoretic. Ischemic changes as noted  Psychiatric: His speech is normal. Mood/affect normal.   Assessment/Plan: 1. Functional deficits secondary to left BKA which require 3+ hours per day of interdisciplinary therapy in a comprehensive inpatient rehab setting. Physiatrist is providing close team supervision and 24 hour management of active medical problems listed below. Physiatrist and rehab team continue to assess barriers to discharge/monitor patient progress toward functional and medical goals.  Function:  Bathing Bathing position   Position: Shower  Bathing parts Body parts bathed by patient: Right arm, Left arm, Chest, Abdomen, Front perineal area, Buttocks, Right upper leg, Left upper leg, Right lower leg, Back    Bathing assist Assist Level: Set up   Set up : To obtain items  Upper Body Dressing/Undressing Upper body dressing   What is the patient wearing?: Button up shirt     Pull over shirt/dress - Perfomed by patient: Put head through opening, Thread/unthread  right sleeve, Thread/unthread left sleeve Pull over shirt/dress - Perfomed by helper: Pull shirt over trunk Button up shirt - Perfomed by patient: Thread/unthread right sleeve, Thread/unthread left sleeve, Pull shirt around back, Button/unbutton shirt      Upper body assist Assist Level: Set up   Set up : To obtain clothing/put away  Lower Body Dressing/Undressing Lower body dressing   What is the patient wearing?: Underwear, Pants, Socks, Shoes Underwear -  Performed by patient: Thread/unthread right underwear leg, Thread/unthread left underwear leg, Pull underwear up/down   Pants- Performed by patient: Thread/unthread right pants leg, Thread/unthread left pants leg, Pull pants up/down, Fasten/unfasten pants Pants- Performed by helper: Pull pants up/down   Non-skid slipper socks- Performed by helper: Don/doff right sock Socks - Performed by patient: Don/doff right sock   Shoes - Performed by patient: Don/doff right shoe, Fasten right            Lower body assist Assist for lower body dressing: Set up   Set up : To obtain clothing/put away  Toileting Toileting          Toileting assist     Transfers Chair/bed transfer   Chair/bed transfer method: Squat pivot Chair/bed transfer assist level: Touching or steadying assistance (Pt > 75%) Chair/bed transfer assistive device: Armrests     Locomotion Ambulation     Max distance: 10' Assist level: Moderate assist (Pt 50 - 74%)   Wheelchair   Type: Manual Max wheelchair distance: 150 Assist Level: Supervision or verbal cues  Cognition Comprehension Comprehension assist level: Follows basic conversation/direction with extra time/assistive device  Expression Expression assist level: Expresses basic needs/ideas: With extra time/assistive device  Social Interaction Social Interaction assist level: Interacts appropriately with others - No medications needed.  Problem Solving Problem solving assist level: Solves basic 90% of the time/requires cueing < 10% of the time  Memory Memory assist level: Recognizes or recalls 75 - 89% of the time/requires cueing 10 - 24% of the time    Medical Problem List and Plan: 1. Abnormality of gait, post-operative pain secondary to left BKA on 1/30. Clean amputation daily with soap and water Monitor incision site for signs of infection or impending skin breakdown. Staples to remain in place for 3-4 weeks Stump shrinker, for edema control Scar  mobilization massaging to prevent soft tissue adherence Stump protector during therapies Prevent flexion contractures by implementing the following:   Encourage prone lying for 20-30 mins per day BID to avoid hip flexion contractures if medically appropriate;  Avoid pillow under knees when patient is lying in bed in order to prevent both  knee and hip flexion contractures;  Avoid prolonged sitting When using wheelchair, patient should have knee on amputated side fully extended with board under the seat cushion. Avoid injury to contralateral side 2. DVT Prophylaxis/Anticoagulation: Pharmaceutical: Lovenox 3. Pain Management: Fentanyl titrated to 25 mcg/ 72 hrs on 02/04--Continue fentanyl patch with prn oxycodone.   Dysesthesias - mainly with HD   Spoke to therapies regarding desensitization techniques, will implement and continue to monitor 4. Mood: Team to provide ego support. LCSW to follow for evaluation and support.  5. Neuropsych: This patient is capable of making decisions on his own behalf. 6. Skin/Wound Care: Monitor wound daily.  7. Fluids/Electrolytes/Nutrition: Strict I/O. 1200 cc FR/daily 8. PAD: Continue ASA/plavix. 9. ESRD: On HD T, Th, Sa-schedule procedure after therapy session to help with activity tolerance during the day. Daily weights. Strict I/O.  10 DM type 2: Monitor BS with ac/hs  checks. Continue SSI for tighter BS control. Continue to hold lantus.   Fasting CBP 72 this AM 11. 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 Bowel: Pt notes diarrhea and constipation at times.    Stable at present 13. Acute on chronic anemia  Hb 9.3 and 2/9  Will cont to monitor  LOS (Days) 4 A FACE TO FACE EVALUATION WAS PERFORMED  Tyhir Schwan Lorie Phenix 07/20/2015 8:26 AM

## 2015-07-20 NOTE — Progress Notes (Signed)
Occupational Therapy Session Note  Patient Details  Name: John [name] MRN: [id number] Date of Birth: April 10, 1956  Today's Date: 07/20/2015 OT Individual Time: 1400-1500 OT Individual Time Calculation (min): 60 min    Short Term Goals: Week 1:  OT Short Term Goal 1 (Week 1): STG = LTG d/t short LOS  Skilled Therapeutic Interventions/Progress Updates:    Treatment session with focus on BUE strengthening and standing balance.  Pt reports pain in LLE, however not time for scheduled pain meds.  Reiterated importance of knee extension to prevent flexion contracture and improve fit and use of prosthesis.  Pt propelled w/c to therapy gym without assist.  Engaged in UBE on resistance level 2.0 with 2 sets of 2 mins forward and 2 mins backward for total of 8 mins.  Provided HEP program with theraband for BUE strengthening as needed for transfers and ambulation with RW.  Pt able to demonstrate 3 exercises with 2 reps of 10 each.  Engaged in standing outside parallel bars with focus on sit > stand and LLE exercises to promote ROM and knee extension.  Returned to room and RN present to provide scheduled pain meds.  Therapy Documentation Precautions:  Precautions Precautions: Fall Restrictions Weight Bearing Restrictions: Yes LLE Weight Bearing: Non weight bearing General:   Vital Signs: Therapy Vitals Pulse Rate: 79 Resp: 18 BP: (!) 114/55 mmHg Patient Position (if appropriate): Sitting Oxygen Therapy SpO2: 99 % O2 Device: Not Delivered Pain: Pain Assessment Pain Assessment: 0-10 Pain Score: 7  Pain Type: Acute pain Pain Location: Leg Pain Orientation: Left Pain Descriptors / Indicators: Sharp;Shooting Pain Frequency: Intermittent Pain Onset: On-going Patients Stated Pain Goal: 4 Pain Intervention(s): Medication (See eMAR) ADL: ADL ADL Comments: see Functional Assessment Tool  See Function Navigator for Current Functional Status.   Therapy/Group: Individual Therapy  Simonne Come 07/20/2015, 3:22 PM

## 2015-07-20 NOTE — Progress Notes (Signed)
Orthopedic Tech Progress Note Patient Details:  CHEROKEE SCRIVER 1956/05/03 [id number]  Patient ID: Elly Modena, male   DOB: [birth year], 60 y.o.   MRN: [id number] Called in advanced brace order; spoke with Cletis Athens, Dontray Haberland 07/20/2015, 9:10 AM

## 2015-07-20 NOTE — Progress Notes (Signed)
Occupational Therapy Session Note  Patient Details  Name: John [name] MRN: [id number] Date of Birth: Mar 30, 1956  Today's Date: 07/20/2015 OT Individual Time: 0730-0900 OT Individual Time Calculation (min): 90 min    Short Term Goals: Week 1:  OT Short Term Goal 1 (Week 1): STG = LTG d/t short LOS  Skilled Therapeutic Interventions/Progress Updates: ADL-retraining with focus on pain management/desensitization of LLE, transfers, adapted dressing skills and effective use of DME to improve performance in BADL.   Pt received in bed, reporting severe pain during dialysis treatment previous night.   OT re-educated pt on pain management strategies to include desensitization, therapeutic use of shower and initial TENs trial.   Pt receptive to TENS from prior training, delcaring that he possesses a TENs at home, used for chronic low back pain.   Pt completed BADL with overall setup assist and min instructional cues to stand supported at sink while pulling up his pants.   Pt groomed at sink this session and required min assist to shave d/t neuropathies limiting sensation to fingertips (unsafe with use of safety razor).   RN notified of need for dressing change at end of session.     Therapy Documentation Precautions:  Precautions Precautions: Fall Restrictions Weight Bearing Restrictions: Yes LLE Weight Bearing: Non weight bearing  Pain: Pain Assessment Pain Assessment: 0-10 Pain Score: 6  Pain Type: Acute pain Pain Location: Leg Pain Orientation: Left Patients Stated Pain Goal: 4 Pain Intervention(s): Medication (See eMAR);Shower  ADL: ADL ADL Comments: see Functional Assessment Tool  See Function Navigator for Current Functional Status.   Therapy/Group: Individual Therapy  [place] 07/20/2015, 12:24 PM

## 2015-07-20 NOTE — Progress Notes (Signed)
[facility] Progress Note   Assessment:  1. Left BKA: admitted for rehab. Per primary. 2. ESRD - TTS at [facility]. HD 02/07. Next tx tomorrow. K+4.4 3. Hypertension/volume -  HD 07/17/15. Post wt 70.3 kg (bed weight-needs hoyer wt). Wt today 72.7 kg. Will attempt UFG 2-2.5 liters tomorrow.  4. Anemia - 9.1. Will give ESA and Fe [id number].  5. Metabolic bone disease - Continue OP binders. Phos 7.2. Ca 9.1 C Ca 10.14. Has been refusing binders. Counseled.  6. Nutrition - Albumin 2.7 Renal diet, add nepro/renal vits.  7. DM: per primary 8. Volume - cramped with last HD, is 7kg under prior dry wt which is lower than it should be (usually 3 kg with BK amputation).   Plan - HD Sat, standing wt, liter bolus w HD tomorrow  Kelly Splinter MD [place] pager [phone]    cell [phone] 07/20/2015, 4:25 PM    Subjective: "I was in so much pain in dialysis yesterday-I never had anything like that". Up in chair, has been participating in therapy.  States that his BKA hurt entire time while on machine. No C/Os at present. Per nurse, patient has been refusing binders, attempting to refuse lantus. Discussed with pt importance of adherence to medical therapy.     Objective Filed Vitals:   07/19/15 2023 07/20/15 0554 07/20/15 0558 07/20/15 1400  BP: 124/51 140/68  114/55  Pulse: 84 86  79  Temp: 97.7 F (36.5 C) 98.7 F (37.1 C)    TempSrc: Oral Oral    Resp: 22 18  18   Height:      Weight:   73.1 kg (161 lb 2.5 oz)   SpO2: 98% 98%  99%   Physical Exam General: cooperative, NAD Heart: S1, S2, RRR No M/G/R Lungs: CTA AP Abdomen: soft nontender Extremities: L BKA, no RLE edema. Hands are darkened. 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 Dialysis Access: LUA AVF + thrill + bruit  Dialysis Orders: [facility] MWF TueThuSat, 4 hrs 0 min, 180NRe Optiflux, BFR 400, DFR Manual 800 mL/min, EDW 80 (kg), Dialysate 2.0 K, 2.0 Ca, UFR Profile: None, Sodium Model: None, Access: LUA AV  Fistula Heparin: 7500 units per treatment MWF Mircera: 100 mcg IV q 2 weeks ( last dose 07/01/14) Venofer: 50 mg IV weekly (last dose 07/05/15)  Additional Objective Labs: Basic Metabolic Panel:  Recent Labs Lab 07/17/15 0633 07/17/15 1700 07/19/15 1704  NA 137 137 137  K 4.4 4.4 4.0  CL 95* 96* 93*  CO2 26 24 26   GLUCOSE 93 104* 86  BUN 59* 65* 55*  CREATININE 10.57* 11.27* 9.78*  CALCIUM 9.1 9.3 9.0  PHOS 7.1* 7.2* 6.5*   Liver Function Tests:  Recent Labs Lab 07/17/15 0633 07/17/15 1700 07/19/15 1704  ALBUMIN 2.7* 2.9* 3.0*   No results for input(s): LIPASE, AMYLASE in the last 168 hours. CBC:  Recent Labs Lab 07/17/15 0633 07/17/15 1700 07/19/15 1705  WBC 8.5 8.5 8.0  HGB 9.9* 9.1* 9.3*  HCT 32.1* 29.9* 30.0*  MCV 88.9 87.9 90.6  PLT 191 224 212   Blood Culture    Component Value Date/Time   SDES BLOOD RIGHT HAND 01/19/2015 2228   SPECREQUEST BOTTLES DRAWN AEROBIC ONLY 5CC 01/19/2015 2228   CULT NO GROWTH 5 DAYS 01/19/2015 2228   REPTSTATUS 01/25/2015 FINAL 01/19/2015 2228    Cardiac Enzymes: No results for input(s): CKTOTAL, CKMB, CKMBINDEX, TROPONINI in the last 168 hours. CBG:  Recent Labs Lab 07/19/15 [phone] 07/19/15 1203  07/19/15 2037 07/20/15 0639 07/20/15 1131  GLUCAP 79 105* 78 72 107*   Iron Studies: No results for input(s): IRON, TIBC, TRANSFERRIN, FERRITIN in the last 72 hours. @lablastinr3 @ Studies/Results: No results found. Medications:   . aspirin EC  81 mg Oral Daily  . atorvastatin  40 mg Oral q1800  . clopidogrel  75 mg Oral Daily  . darbepoetin (ARANESP) injection - DIALYSIS  150 mcg Intravenous Q Thu-HD  . docusate sodium  100 mg Oral BID  . DULoxetine  20 mg Oral Daily  . enoxaparin (LOVENOX) injection  30 mg Subcutaneous Q24H  . feeding supplement (NEPRO CARB STEADY)  237 mL Oral BID BM  . fentaNYL  25 mcg Transdermal Q72H  . ferric gluconate (FERRLECIT/NULECIT) IV  62.5 mg Intravenous Weekly  . hydrocerin    Topical BID  . insulin aspart  0-5 Units Subcutaneous QHS  . insulin aspart  0-9 Units Subcutaneous TID WC  . insulin glargine  5 Units Subcutaneous Daily  . lanthanum  1,000 mg Oral TID WC  . methocarbamol  500 mg Oral QID  . multivitamin  1 tablet Oral QHS

## 2015-07-20 NOTE — Progress Notes (Signed)
Physical Therapy Session Note  Patient Details  Name: John [name] MRN: [id number] Date of Birth: Jan 18, 1956  Today's Date: 07/20/2015 PT Individual Time: 0900-1000 PT Individual Time Calculation (min): 60 min   Short Term Goals: Week 1:  PT Short Term Goal 1 (Week 1): = LTG due to short LOS  Skilled Therapeutic Interventions/Progress Updates:   Pt received in w/c with RN present to redress residual limb and provide medication.  Continued education with pt about positioning, exercises for ROM, pain management techniques and use of shrinker for edema management and shaping.  Once limb redressed assisted pt with donning shrinker.  Performed w/c mobility in controlled environment x 150' with supervision.  Pt performed transfer w/c > mat squat pivot with supervision and verbal cues for set up and sequencing.  Once on mat attempted to apply Kinesiotape to L quad for activation for knee extension and L hamstring for decreased tone but tape does not stick to patient's skin and tape rolled off immediately.  Transitioned back into w/c to perform ramp negotiation in w/c x 2 reps with min A behind patient for safety.  Performed gait training x 15' + 10' with RW and min-mod A as patient fatigued due to poor RLE clearance and mild knee buckling in stance-verbal cues for shoulder depression and UE extension to advance RLE.  At end of session pt returned to room and left in w/c with LLE supported on pad with pillow; pt to receive new knee immobilizer later today.    Therapy Documentation Precautions:  Precautions Precautions: Fall Restrictions Weight Bearing Restrictions: Yes LLE Weight Bearing: Non weight bearing Pain: Pain Assessment Pain Assessment: 0-10 Pain Score: 6  Pain Type: Acute pain Pain Location: Leg Pain Orientation: Left Patients Stated Pain Goal: 4 Pain Intervention(s): Medication (See eMAR);Shower  See Function Navigator for Current Functional Status.   Therapy/Group: Individual  Therapy  Raylene Everts [facility] 07/20/2015, 12:43 PM

## 2015-07-21 ENCOUNTER — Inpatient Hospital Stay (HOSPITAL_COMMUNITY): Payer: Medicare Other | Admitting: Physical Therapy

## 2015-07-21 ENCOUNTER — Inpatient Hospital Stay (HOSPITAL_COMMUNITY): Payer: Medicare Other | Admitting: Occupational Therapy

## 2015-07-21 LAB — RENAL FUNCTION PANEL
ALBUMIN: 3 g/dL — AB (ref 3.5–5.0)
Anion gap: 16 — ABNORMAL HIGH (ref 5–15)
BUN: 46 mg/dL — AB (ref 6–20)
CALCIUM: 8.9 mg/dL (ref 8.9–10.3)
CO2: 27 mmol/L (ref 22–32)
CREATININE: 7.97 mg/dL — AB (ref 0.61–1.24)
Chloride: 88 mmol/L — ABNORMAL LOW (ref 101–111)
GFR calc Af Amer: 8 mL/min — ABNORMAL LOW (ref >60)
GFR calc non Af Amer: 6 mL/min — ABNORMAL LOW (ref >60)
GLUCOSE: 123 mg/dL — AB (ref 65–99)
Phosphorus: 5.7 mg/dL — ABNORMAL HIGH (ref 2.5–4.6)
Potassium: 3.7 mmol/L (ref 3.5–5.1)
SODIUM: 131 mmol/L — AB (ref 135–145)

## 2015-07-21 LAB — CBC
HCT: 30.6 % — ABNORMAL LOW (ref 39.0–52.0)
Hemoglobin: 9.1 g/dL — ABNORMAL LOW (ref 13.0–17.0)
MCH: 26.2 pg (ref 26.0–34.0)
MCHC: 29.7 g/dL — AB (ref 30.0–36.0)
MCV: 88.2 fL (ref 78.0–100.0)
PLATELETS: 228 10*3/uL (ref 150–400)
RBC: 3.47 MIL/uL — ABNORMAL LOW (ref 4.22–5.81)
RDW: 17.1 % — AB (ref 11.5–15.5)
WBC: 9.4 10*3/uL (ref 4.0–10.5)

## 2015-07-21 LAB — GLUCOSE, CAPILLARY
GLUCOSE-CAPILLARY: 98 mg/dL (ref 65–99)
GLUCOSE-CAPILLARY: 94 mg/dL (ref 65–99)
Glucose-Capillary: 92 mg/dL (ref 65–99)

## 2015-07-21 MED ORDER — OXYCODONE-ACETAMINOPHEN 5-325 MG PO TABS
ORAL_TABLET | ORAL | Status: AC
  Filled 2015-07-21: qty 2

## 2015-07-21 MED ORDER — HEPARIN SODIUM (PORCINE) 1000 UNIT/ML DIALYSIS
2000.0000 [IU] | INTRAMUSCULAR | Status: DC | PRN
  Filled 2015-07-21: qty 2

## 2015-07-21 MED ORDER — SODIUM CHLORIDE 0.9 % IV SOLN: 100.0000 mL | INTRAVENOUS | Status: DC | PRN

## 2015-07-21 MED ORDER — PENTAFLUOROPROP-TETRAFLUOROETH EX AERO: 1.0000 "application " | INHALATION_SPRAY | CUTANEOUS | Status: DC | PRN

## 2015-07-21 MED ORDER — LIDOCAINE-PRILOCAINE 2.5-2.5 % EX CREA
1.0000 "application " | TOPICAL_CREAM | CUTANEOUS | Status: DC | PRN
  Filled 2015-07-21: qty 5

## 2015-07-21 MED ORDER — LIDOCAINE HCL (PF) 1 % IJ SOLN
5.0000 mL | INTRAMUSCULAR | Status: DC | PRN
  Filled 2015-07-21: qty 5

## 2015-07-21 MED ORDER — ALTEPLASE 2 MG IJ SOLR
2.0000 mg | Freq: Once | INTRAMUSCULAR | Status: DC | PRN
  Filled 2015-07-21: qty 2

## 2015-07-21 MED ORDER — HEPARIN SODIUM (PORCINE) 1000 UNIT/ML DIALYSIS
1000.0000 [IU] | INTRAMUSCULAR | Status: DC | PRN
  Filled 2015-07-21: qty 1

## 2015-07-21 NOTE — Progress Notes (Signed)
Physical Therapy Session Note  Patient Details  Name: John [name] MRN: [id number] Date of Birth: [birth year]  Today's Date: 07/21/2015 PT Individual Time: 1110-1155 and 1300-1400 PT Individual Time Calculation (min): 45 min and 60 min  Short Term Goals: Week 1:  PT Short Term Goal 1 (Week 1): = LTG due to short LOS  Skilled Therapeutic Interventions/Progress Updates:   AM session: Pt received in w/c with LLE in limb guard but hanging down from w/c seat; re-educated pt on maintaining LLE supported on amputee support pad for edema management and pain; pt reports having LLE up on bed but lowered it to rest back of leg.  Pt reporting his wife to be here this pm.  Performed w/c mobility with bilat UE and RLE mod I in controlled environment x 150' x 2.  Reviewed recommendations for transportation to/from HD and that MD would likely restrict pt's driving at first; pt verbalized understanding but does have a goal of being able to drive independently again.  Performed simulated car transfer (for when grandson' transports) with pt performing set up of w/c and transfer squat pivot w/c <> car mod I.  Pt reporting that when he drives himself he drives his tall Lucianne Lei; pt demonstrated to therapist how he positions his w/c and performs a stand pivot into driver's seat with UE support on door and then sits on edge of driver's seat to breakdown and load w/c in back door of van.  Pt required supervision.  Pt agreeable to wait and practice on actual van with [facility] therapy prior to performing alone once home.  Returned to w/c and performed transfer w/c<>recliner to simulate HD.  Pt demonstrated safe set up and transfer w/c <> recliner squat pivot mod I.  Pt performed multiple sit <> stands from w/c with supervision-min A as he fatigued and performed standing LLE strengthening and ROM exercises with 2 sets x 12 reps open chain hip extension + 12 reps hip ABD.  Returned to w/c and wife present at end of session.  Discussed with  wife pt's progress and goal of therapy session in pm.  Wife agreeable to observe; wife not able to assist physically as she is on supplemental 02 and requires use of transport w/c for mobility.    PM session: Family present for education/training.  Educated family on purpose of shrinker sock as well as residual limb protector and how to don appropriately as well as wear schedule.  Family verbalized understanding.  Wife unable to assist physically but observed pt perform w/c mobility mod I in controlled environment, w/c set up and transfer w/c <> car squat pivot mod I, and gait with RW x 12' in controlled environment with min A and verbal cues for use of UE extension and shoulder depression to elevate body and advance RLE.  Wife pleased with pt progress and states that pt is doing everything at or close to baseline prior to BKA.  For rest of session pt engaged in LE ROM and strengthening exercises on mat; performed squat pivot w/c <> mat and sit <> supine and rolling on flat mat, no rails Mod I.  Performed supine bilat LE glute sets + bridges with LLE in limb protector and supported on pillow x 12 reps, 12 reps L quad sets + open chain hip ABD.  Transitioned to R sidelying and performed 12 reps each L open chain hip ABD and L hip ABD with hip extension.  Pt returned to w/c and back to room to  bed squat pivot Mod I.  Pt left in bed with all items within reach to await RN to provide pain medication before HD.      Therapy Documentation Precautions:  Precautions Precautions: Fall Restrictions Weight Bearing Restrictions: Yes LLE Weight Bearing: Non weight bearing Pain: Pain Assessment Pain Assessment: 0-10 Pain Score: 8  Pain Type: Acute pain;Surgical pain Pain Location: Leg Pain Orientation: Left Pain Frequency: Constant Pain Onset: On-going Pain Intervention(s): Medication (See eMAR)   See Function Navigator for Current Functional Status.   Therapy/Group: Individual Therapy  Raylene Everts  [facility] 07/21/2015, 12:28 PM

## 2015-07-21 NOTE — Progress Notes (Signed)
[facility] PHYSICAL MEDICINE & REHABILITATION     PROGRESS NOTE  Subjective/Complaints:  Patient seen resting in bed this point. DVT slept well overnight he is feeling better today. He is wearing his shrinker and immobilizer.  ROS:  Denies stump pain, CP, SOB, n/v/d.   Objective: Vital Signs: Blood pressure 154/67, pulse 80, temperature 97.9 F (36.6 C), temperature source Oral, resp. rate 18, height 5\' 9"  (1.753 m), weight 70 kg (154 lb 5.2 oz), SpO2 98 %. No results found.  Recent Labs  07/19/15 1705  WBC 8.0  HGB 9.3*  HCT 30.0*  PLT 212    Recent Labs  07/19/15 1704  NA 137  K 4.0  CL 93*  GLUCOSE 86  BUN 55*  CREATININE 9.78*  CALCIUM 9.0   CBG (last 3)   Recent Labs  07/20/15 2043 07/21/15 0642 07/21/15 1152  GLUCAP 106* 98 94    Wt Readings from Last 3 Encounters:  07/21/15 70 kg (154 lb 5.2 oz)  06/13/15 82.419 kg (181 lb 11.2 oz)  01/19/15 86.4 kg (190 lb 7.6 oz)    Physical Exam:  BP 154/67 mmHg  Pulse 80  Temp(Src) 97.9 F (36.6 C) (Oral)  Resp 18  Ht 5\' 9"  (1.753 m)  Wt 70 kg (154 lb 5.2 oz)  BMI 22.78 kg/m2  SpO2 98% Constitutional: He appears well-developed and well-nourished.  NAD HENT: Atraumatic.  Mouth/Throat: Poor dentition  Eyes: Lids are normal. Left eye exhibits abnormal extraocular motion. 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  Cardiovascular: Normal rate and regular rhythm. Murmur heard. Respiratory: Effort normal and breath sounds normal. No respiratory distress. He exhibits no tenderness.  GI: Soft. Bowel sounds are normal. He exhibits no distension. There is no tenderness. There is no guarding.  Musculoskeletal: He exhibits no edema  L-BKA with edema and sutures intact. Residual limb flexed with inability to fully extend.  Fingers b/l atrophic and ischemic appearing on left.  Left knee with DTI on patella (improving).  Knee with arthritic changes and tight hamstrings.  Neurological: He is alert and oriented. Right  exopthalmic.  He was able to follow simple motor commands without difficulty.  Motor: RUE: 5/5 LUE 4+/5 LLE: hip flexion 4/5 RLE: 4+/5 proximal to distal  Skin: Skin is warm and dry. He is not diaphoretic. Small area on lateral leg minimal serous drainage. Ischemic changes as noted  Psychiatric: His speech is normal. Mood/affect normal.   Assessment/Plan: 1. Functional deficits secondary to left BKA which require 3+ hours per day of interdisciplinary therapy in a comprehensive inpatient rehab setting. Physiatrist is providing close team supervision and 24 hour management of active medical problems listed below. Physiatrist and rehab team continue to assess barriers to discharge/monitor patient progress toward functional and medical goals.  Function:  Bathing Bathing position Bathing activity did not occur: Refused Position: Shower  Bathing parts Body parts bathed by patient: Right arm, Left arm, Chest, Abdomen, Front perineal area, Buttocks, Right upper leg, Left upper leg, Right lower leg, Back    Bathing assist Assist Level: No help, No cues   Set up : To obtain items  Upper Body Dressing/Undressing Upper body dressing Upper body dressing/undressing activity did not occur: Refused What is the patient wearing?: Pull over shirt/dress     Pull over shirt/dress - Perfomed by patient: Thread/unthread right sleeve, Thread/unthread left sleeve, Put head through opening, Pull shirt over trunk Pull over shirt/dress - Perfomed by helper: Pull shirt over trunk Button up shirt - Perfomed by patient:  Thread/unthread right sleeve, Thread/unthread left sleeve, Pull shirt around back, Button/unbutton shirt      Upper body assist Assist Level: Set up   Set up : To obtain clothing/put away  Lower Body Dressing/Undressing Lower body dressing   What is the patient wearing?: Underwear, Socks, Shoes Underwear - Performed by patient: Thread/unthread right underwear leg, Thread/unthread left  underwear leg, Pull underwear up/down   Pants- Performed by patient: Thread/unthread right pants leg, Thread/unthread left pants leg, Pull pants up/down, Fasten/unfasten pants Pants- Performed by helper: Pull pants up/down   Non-skid slipper socks- Performed by helper: Don/doff right sock Socks - Performed by patient: Don/doff right sock   Shoes - Performed by patient: Don/doff right shoe, Fasten right            Lower body assist Assist for lower body dressing: Set up   Set up : To obtain clothing/put away  Toileting Toileting Toileting activity did not occur:  (did not occur)        Toileting assist     Transfers Chair/bed transfer   Chair/bed transfer method: Squat pivot Chair/bed transfer assist level: Supervision or verbal cues Chair/bed transfer assistive device: Armrests     Locomotion Ambulation     Max distance: 56' + 10' Assist level: Moderate assist (Pt 50 - 74%)   Wheelchair   Type: Manual Max wheelchair distance: 150 Assist Level: No help, No cues, assistive device, takes more than reasonable amount of time  Cognition Comprehension Comprehension assist level: Follows basic conversation/direction with extra time/assistive device  Expression Expression assist level: Expresses basic needs/ideas: With extra time/assistive device  Social Interaction Social Interaction assist level: Interacts appropriately with others - No medications needed.  Problem Solving Problem solving assist level: Solves basic 90% of the time/requires cueing < 10% of the time  Memory Memory assist level: Recognizes or recalls 90% of the time/requires cueing < 10% of the time    Medical Problem List and Plan: 1. Abnormality of gait, post-operative pain secondary to left BKA on 1/30. Clean amputation daily with soap and water Monitor incision site for signs of infection or impending skin breakdown. Staples to remain in place for 3-4 weeks Stump shrinker, for edema control Scar  mobilization massaging to prevent soft tissue adherence Stump protector during therapies Prevent flexion contractures by implementing the following:   Encourage prone lying for 20-30 mins per day BID to avoid hip flexion contractures if medically appropriate;  Avoid pillow under knees when patient is lying in bed in order to prevent both  knee and hip flexion contractures;  Avoid prolonged sitting When using wheelchair, patient should have knee on amputated side fully extended with board under the seat cushion. Avoid injury to contralateral side Knee immobilizer place, it does not appear to be causing any areas of excessive pressure or be causing excessive pressure and previous DTIs, will continue to monitor 2. DVT Prophylaxis/Anticoagulation: Pharmaceutical: Lovenox 3. Pain Management: Fentanyl titrated to 25 mcg/ 72 hrs on 02/04--Continue fentanyl patch with prn oxycodone.   Dysesthesias - mainly with HD   Desensitization techniques and limited by therapies 4. Mood: Team to provide ego support. LCSW to follow for evaluation and support.  5. Neuropsych: This patient is capable of making decisions on his own behalf. 6. Skin/Wound Care: Monitor wound daily.  7. Fluids/Electrolytes/Nutrition: Strict I/O. 1200 cc FR/daily 8. PAD: Continue ASA/plavix. 9. ESRD: On HD T, Th, Sa-schedule procedure after therapy session to help with activity tolerance during the day. Daily weights. Strict I/O.  10 DM type 2: Monitor BS with ac/hs checks. Continue SSI for tighter BS control. Continue to hold lantus.   Fasting CBP 94 this AM 11. 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 Bowel: Pt notes diarrhea and constipation at times.    Stable at present 13. Acute on chronic anemia  Hb 9.3 and 2/9  Will cont to monitor  LOS (Days) 5 A FACE TO FACE EVALUATION WAS PERFORMED  Darryl Willner Lorie Phenix 07/21/2015 12:37  PM

## 2015-07-21 NOTE — Progress Notes (Signed)
Occupational Therapy Session Note  Patient Details  Name: John [name] MRN: [id number] Date of Birth: 03-22-1956  Today's Date: 07/21/2015 OT Individual Time: [id number] OT Individual Time Calculation (min): 75 min   Short Term Goals: Week 1:  OT Short Term Goal 1 (Week 1): STG = LTG d/t short LOS  Skilled Therapeutic Interventions/Progress Updates:   Pain Assessment: Pt with complaints of 9/10 pain in left residual limb. Pt reports he asked for pain meds, but unable to get any at this time. Educated pt on techniques to decrease pain in left residual limb.   Pt found seated EOB finishing breakfast. Pt requested to stay seated EOB to finish breakfast instead of getting into w/c. Administered red built up foam for patient to use during self feeding secondary to his complaints of self-feeding being difficult due to possible need for surgery and "feels like fork is pinching my fingers". Pt liked red built up foam and was a success for self-feeding. After breakfast, therapist encouraged pt to work on some stretching exercises for left residual limb, to promote knee extension. Pt donned right shoe, then therapist performed compression wrapping to limb. Pt with difficulty performing this due to decreased strength in bilateral hands. Pt then donned knee extension brace and transferred EOB to w/c. Pt refused shower this am, stating he's showered every day this week. Pt did sit at sink for grooming tasks of washing face and brushing teeth, then applied lotion to bilateral arms. From here, pt propelled self to therapy gym and transferred onto therapy mat. Pt performed 3 sets of 10 pushups using black foam blocks, pt with good bottom clearance during this. Pt took ~1 minute rest break between each set. Pt then engaged in exercise focusing on functional strengthening of bilateral hands, core strengthening, AND functional reach, using graded clips. Pt did well with yellow (1 grade), red (2 grade), green (3 grade)  clips; pt with difficulty opening blue (4 grade) and black (5 grade) clips - requiring cueing and increased time. After these exercises, pt transferred back into w/c and propelled self back to room. At end of session, left pt seated in w/c with all needs within reach. Also, at end of session, pt stated pain had eased off "some".   Therapy Documentation Precautions:  Precautions Precautions: Fall Restrictions Weight Bearing Restrictions: Yes LLE Weight Bearing: Non weight bearing  Vital Signs: Therapy Vitals Temp: 97.9 F (36.6 C) Temp Source: Oral Pulse Rate: 80 Resp: 18 BP: (!) 154/67 mmHg Patient Position (if appropriate): Lying Oxygen Therapy SpO2: 98 % O2 Device: Nasal Cannula   ADL: ADL ADL Comments: see Functional Assessment Tool  See Function Navigator for Current Functional Status.  Therapy/Group: Individual Therapy  Chrys Racer , MS, OTR/L, CLT Pager: [phone]  07/21/2015, 9:09 AM

## 2015-07-22 ENCOUNTER — Inpatient Hospital Stay (HOSPITAL_COMMUNITY): Payer: Medicare Other | Admitting: Physical Therapy

## 2015-07-22 LAB — GLUCOSE, CAPILLARY
GLUCOSE-CAPILLARY: 73 mg/dL (ref 65–99)
GLUCOSE-CAPILLARY: 82 mg/dL (ref 65–99)
Glucose-Capillary: 163 mg/dL — ABNORMAL HIGH (ref 65–99)
Glucose-Capillary: 122 mg/dL — ABNORMAL HIGH (ref 65–99)

## 2015-07-22 MED ORDER — OXYCODONE HCL 5 MG PO TABS: 5.0000 mg | ORAL_TABLET | ORAL | Status: DC | PRN

## 2015-07-22 MED ORDER — OXYCODONE HCL 5 MG PO TABS
10.0000 mg | ORAL_TABLET | ORAL | Status: DC | PRN
  Administered 2015-07-22 – 2015-07-24 (×5): 10 mg via ORAL
  Filled 2015-07-22 (×5): qty 2

## 2015-07-22 MED ORDER — ENOXAPARIN SODIUM 30 MG/0.3ML ~~LOC~~ SOLN
30.0000 mg | Freq: Every day | SUBCUTANEOUS | Status: DC
  Administered 2015-07-22 – 2015-07-23 (×3): 30 mg via SUBCUTANEOUS
  Filled 2015-07-22 (×3): qty 0.3

## 2015-07-22 NOTE — Plan of Care (Signed)
Problem: RH PAIN MANAGEMENT Goal: RH STG PAIN MANAGED AT OR BELOW PT'S PAIN GOAL Pain < or = 4 out 10 with min assistance  Outcome: Not Progressing Pain always rated between 8 and 10, comes down to a 7. Trying Oxy IR today/

## 2015-07-22 NOTE — Progress Notes (Addendum)
LBM on 2/10. Patient refused bowel meds today. Educated on pain meds and constipation. Pt refusing Fosrenol, poor appetite today. States rested prone for "a while" this morning. Self managed brace (took 2 hour break, then put it back on).   New open site in the middle of incision area, brown drainage.

## 2015-07-22 NOTE — Progress Notes (Signed)
[facility]     PROGRESS NOTE  Subjective/Complaints:  Pt resting comfortably in bed this AM.  He slept "fairly well" and is having less pain in HD.    ROS:  Denies stump pain, CP, SOB, n/v/d.   Objective: Vital Signs: Blood pressure 140/54, pulse 86, temperature 98.2 F (36.8 C), temperature source Oral, resp. rate 19, height 5\' 9"  (1.753 m), weight 73 kg (160 lb 15 oz), SpO2 98 %. No results found.  Recent Labs  07/19/15 1705 07/21/15 1545  WBC 8.0 9.4  HGB 9.3* 9.1*  HCT 30.0* 30.6*  PLT 212 228    Recent Labs  07/19/15 1704 07/21/15 1545  NA 137 131*  K 4.0 3.7  CL 93* 88*  GLUCOSE 86 123*  BUN 55* 46*  CREATININE 9.78* 7.97*  CALCIUM 9.0 8.9   CBG (last 3)   Recent Labs  07/21/15 2010 07/22/15 0704 07/22/15 1138  GLUCAP 92 73 82    Wt Readings from Last 3 Encounters:  07/22/15 73 kg (160 lb 15 oz)  06/13/15 82.419 kg (181 lb 11.2 oz)  01/19/15 86.4 kg (190 lb 7.6 oz)    Physical Exam:  BP 140/54 mmHg  Pulse 86  Temp(Src) 98.2 F (36.8 C) (Oral)  Resp 19  Ht 5\' 9"  (1.753 m)  Wt 73 kg (160 lb 15 oz)  BMI 23.76 kg/m2  SpO2 98% Constitutional: He appears well-developed and well-nourished.  NAD HENT: Atraumatic.  Mouth/Throat: Poor dentition  Eyes: Lids are normal. Left eye exhibits abnormal extraocular motion. 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  Cardiovascular: Normal rate and regular rhythm. Murmur heard. Respiratory: Effort normal and breath sounds normal. No respiratory distress. He exhibits no tenderness.  GI: Soft. Bowel sounds are normal. He exhibits no distension. There is no tenderness. There is no guarding.  Musculoskeletal: He exhibits no edema  L-BKA with edema and sutures intact. Residual limb flexed with inability to fully extend.  Fingers b/l atrophic and ischemic appearing on left.  Left knee with DTI on patella (improving).  Knee with arthritic changes and tight hamstrings.  Neurological: He  is alert and oriented. Right exopthalmic.  He was able to follow simple motor commands without difficulty.  Motor: RUE: 5/5 LUE 4+/5 LLE: hip flexion 4/5 RLE: 4+/5 proximal to distal  Skin: Skin is warm and dry. He is not diaphoretic. Small area on lateral stump with minimal serous drainage. Ischemic changes as noted  Psychiatric: His speech is normal. Mood/affect normal.   Assessment/Plan: 1. Functional deficits secondary to left BKA which require 3+ hours per day of interdisciplinary therapy in a comprehensive inpatient rehab setting. Physiatrist is providing close team supervision and 24 hour management of active medical problems listed below. Physiatrist and rehab team continue to assess barriers to discharge/monitor patient progress toward functional and medical goals.  Function:  Bathing Bathing position Bathing activity did not occur: Refused Position: Shower  Bathing parts Body parts bathed by patient: Right arm, Left arm, Chest, Abdomen, Front perineal area, Buttocks, Right upper leg, Left upper leg, Right lower leg, Back    Bathing assist Assist Level: No help, No cues   Set up : To obtain items  Upper Body Dressing/Undressing Upper body dressing Upper body dressing/undressing activity did not occur: Refused What is the patient wearing?: Pull over shirt/dress     Pull over shirt/dress - Perfomed by patient: Thread/unthread right sleeve, Thread/unthread left sleeve, Put head through opening, Pull shirt over trunk Pull over shirt/dress -  Perfomed by helper: Pull shirt over trunk Button up shirt - Perfomed by patient: Thread/unthread right sleeve, Thread/unthread left sleeve, Pull shirt around back, Button/unbutton shirt      Upper body assist Assist Level: Set up   Set up : To obtain clothing/put away  Lower Body Dressing/Undressing Lower body dressing   What is the patient wearing?: Underwear, Socks, Shoes Underwear - Performed by patient: Thread/unthread right  underwear leg, Thread/unthread left underwear leg, Pull underwear up/down   Pants- Performed by patient: Thread/unthread right pants leg, Thread/unthread left pants leg, Pull pants up/down, Fasten/unfasten pants Pants- Performed by helper: Pull pants up/down   Non-skid slipper socks- Performed by helper: Don/doff right sock Socks - Performed by patient: Don/doff right sock   Shoes - Performed by patient: Don/doff right shoe, Fasten right            Lower body assist Assist for lower body dressing: Set up   Set up : To obtain clothing/put away  Toileting Toileting Toileting activity did not occur: No continent bowel/bladder event        Toileting assist     Transfers Chair/bed transfer   Chair/bed transfer method: Squat pivot Chair/bed transfer assist level: Supervision or verbal cues Chair/bed transfer assistive device: Armrests     Locomotion Ambulation     Max distance: 12 Assist level: Touching or steadying assistance (Pt > 75%)   Wheelchair   Type: Manual Max wheelchair distance: 150 Assist Level: No help, No cues, assistive device, takes more than reasonable amount of time  Cognition Comprehension Comprehension assist level: Follows basic conversation/direction with extra time/assistive device  Expression Expression assist level: Expresses basic needs/ideas: With extra time/assistive device  Social Interaction Social Interaction assist level: Interacts appropriately with others - No medications needed.  Problem Solving Problem solving assist level: Solves basic 90% of the time/requires cueing < 10% of the time  Memory Memory assist level: Recognizes or recalls 90% of the time/requires cueing < 10% of the time    Medical Problem List and Plan: 1. Abnormality of gait, post-operative pain secondary to left BKA on 1/30. Clean amputation daily with soap and water Monitor incision site for signs of infection or impending skin breakdown. Staples to remain in place  for 3-4 weeks Stump shrinker, for edema control Scar mobilization massaging to prevent soft tissue adherence Stump protector during therapies Prevent flexion contractures by implementing the following:   Encourage prone lying for 20-30 mins per day BID to avoid hip flexion contractures if medically appropriate;  Avoid pillow under knees when patient is lying in bed in order to prevent both  knee and hip flexion contractures;  Avoid prolonged sitting When using wheelchair, patient should have knee on amputated side fully extended with board under the seat cushion. Avoid injury to contralateral side Knee immobilizer place, it does not appear to be causing any areas of excessive pressure or be causing excessive pressure and previous DTIs, will continue to monitor Will also consider serial casting in future 2. DVT Prophylaxis/Anticoagulation: Pharmaceutical: Lovenox 3. Pain Management: Fentanyl titrated to 25 mcg/ 72 hrs on 02/04--Continue fentanyl patch with prn oxycodone.   Dysesthesias - mainly with HD   Desensitization techniques implemented by therapies 4. Mood: Team to provide ego support. LCSW to follow for evaluation and support.  5. Neuropsych: This patient is capable of making decisions on his own behalf. 6. Skin/Wound Care: Monitor wound daily.  7. Fluids/Electrolytes/Nutrition: Strict I/O. 1200 cc FR/daily 8. PAD: Continue ASA/plavix. 9. ESRD: On HD  T, Th, Sa-schedule procedure after therapy session to help with activity tolerance during the day. Daily weights. Strict I/O.  10 DM type 2: Monitor BS with ac/hs checks. Continue SSI for tighter BS control. Continue to hold lantus.   Fasting CBP 73 this AM 11. 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 Bowel: Pt notes diarrhea and constipation at times.    Stable at present 13. Acute on chronic anemia  Hb 9.1 and 2/11  Will cont to  monitor  LOS (Days) 6 A FACE TO FACE EVALUATION WAS PERFORMED  Haili Donofrio Lorie Phenix 07/22/2015 2:03 PM

## 2015-07-22 NOTE — Progress Notes (Signed)
[facility] Progress Note   Assessment:  1. Left BKA: admitted for rehab. Per primary. 2. ESRD - TTS HD 3. Volume depletion - 7kg under dry wt since amp, should be down just 3-4kg. Cramped severely on Thursday HD. Gave saline w HD yest and kept even, minimal cramping at end of HD in postop leg 4. Anemia - 9.1.  Gave ESA and Fe [id number].  5. Metabolic bone disease - Continue OP binders. Phos 7.2. Ca 9.1 C Ca 10.14. Has been refusing binders. Counseled.  6. Nutrition - Albumin 2.7 Renal diet, add nepro/renal vits.  7. DM: per primary  Plan - cont TTS HD. Let volume come up a little more.   Kelly Splinter MD [facility] pager [phone]    cell [phone] 07/22/2015, 8:56 AM    Subjective: "didn't cramp at HD except for a little at the end"     Objective Filed Vitals:   07/21/15 1835 07/21/15 2008 07/22/15 0500 07/22/15 0557  BP: 144/61 144/68  140/54  Pulse: 90 99  86  Temp: 98.5 F (36.9 C) 98.7 F (37.1 C)  98.2 F (36.8 C)  TempSrc:  Oral  Oral  Resp: 16 19    Height:      Weight: 72.3 kg (159 lb 6.3 oz) 73 kg (160 lb 15 oz) 73 kg (160 lb 15 oz)   SpO2:  94%  98%   Physical Exam General: cooperative, NAD Heart: S1, S2, RRR No M/G/R Lungs: CTA AP Abdomen: soft nontender Extremities: L BKA, no RLE edema. Hands are darkened. 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 Dialysis Access: LUA AVF + thrill + bruit  Dialysis Orders: [facility] MWF TueThuSat, 4 hrs 0 min, 180NRe Optiflux, BFR 400, DFR Manual 800 mL/min, EDW 80 (kg), Dialysate 2.0 K, 2.0 Ca, UFR Profile: None, Sodium Model: None, Access: LUA AV Fistula Heparin: 7500 units per treatment MWF Mircera: 100 mcg IV q 2 weeks ( last dose 07/01/14) Venofer: 50 mg IV weekly (last dose 07/05/15)  Additional Objective Labs: Basic Metabolic Panel:  Recent Labs Lab 07/17/15 1700 07/19/15 1704 07/21/15 1545  NA 137 137 131*  K 4.4 4.0 3.7  CL 96* 93* 88*  CO2 24 26 27   GLUCOSE 104* 86 123*  BUN  65* 55* 46*  CREATININE 11.27* 9.78* 7.97*  CALCIUM 9.3 9.0 8.9  PHOS 7.2* 6.5* 5.7*   Liver Function Tests:  Recent Labs Lab 07/17/15 1700 07/19/15 1704 07/21/15 1545  ALBUMIN 2.9* 3.0* 3.0*   No results for input(s): LIPASE, AMYLASE in the last 168 hours. CBC:  Recent Labs Lab 07/17/15 0633 07/17/15 1700 07/19/15 1705 07/21/15 1545  WBC 8.5 8.5 8.0 9.4  HGB 9.9* 9.1* 9.3* 9.1*  HCT 32.1* 29.9* 30.0* 30.6*  MCV 88.9 87.9 90.6 88.2  PLT 191 224 212 228   Blood Culture    Component Value Date/Time   SDES BLOOD RIGHT HAND 01/19/2015 2228   SPECREQUEST BOTTLES DRAWN AEROBIC ONLY 5CC 01/19/2015 2228   CULT NO GROWTH 5 DAYS 01/19/2015 2228   REPTSTATUS 01/25/2015 FINAL 01/19/2015 2228    Cardiac Enzymes: No results for input(s): CKTOTAL, CKMB, CKMBINDEX, TROPONINI in the last 168 hours. CBG:  Recent Labs Lab 07/20/15 2043 07/21/15 0642 07/21/15 1152 07/21/15 2010 07/22/15 0704  GLUCAP 106* 98 94 92 73   Iron Studies: No results for input(s): IRON, TIBC, TRANSFERRIN, FERRITIN in the last 72 hours. @lablastinr3 @ Studies/Results: No results found. Medications:   . aspirin EC  81 mg Oral Daily  .  atorvastatin  40 mg Oral q1800  . clopidogrel  75 mg Oral Daily  . darbepoetin (ARANESP) injection - DIALYSIS  150 mcg Intravenous Q Thu-HD  . docusate sodium  100 mg Oral BID  . DULoxetine  20 mg Oral Daily  . enoxaparin (LOVENOX) injection  30 mg Subcutaneous QHS  . feeding supplement (NEPRO CARB STEADY)  237 mL Oral BID BM  . fentaNYL  25 mcg Transdermal Q72H  . ferric gluconate (FERRLECIT/NULECIT) IV  62.5 mg Intravenous Weekly  . hydrocerin   Topical BID  . insulin aspart  0-5 Units Subcutaneous QHS  . insulin aspart  0-9 Units Subcutaneous TID WC  . insulin glargine  5 Units Subcutaneous Daily  . lanthanum  1,000 mg Oral TID WC  . methocarbamol  500 mg Oral QID  . multivitamin  1 tablet Oral QHS

## 2015-07-23 ENCOUNTER — Inpatient Hospital Stay (HOSPITAL_COMMUNITY): Payer: Medicare Other

## 2015-07-23 ENCOUNTER — Inpatient Hospital Stay (HOSPITAL_COMMUNITY): Payer: Medicare Other | Admitting: Physical Therapy

## 2015-07-23 ENCOUNTER — Telehealth: Payer: Self-pay | Admitting: Family Medicine

## 2015-07-23 DIAGNOSIS — L089 Local infection of the skin and subcutaneous tissue, unspecified: Secondary | ICD-10-CM

## 2015-07-23 DIAGNOSIS — S88112D Complete traumatic amputation at level between knee and ankle, left lower leg, subsequent encounter: Secondary | ICD-10-CM

## 2015-07-23 DIAGNOSIS — S81809A Unspecified open wound, unspecified lower leg, initial encounter: Principal | ICD-10-CM

## 2015-07-23 LAB — RENAL FUNCTION PANEL
ALBUMIN: 2.7 g/dL — AB (ref 3.5–5.0)
ANION GAP: 13 (ref 5–15)
BUN: 39 mg/dL — AB (ref 6–20)
CO2: 29 mmol/L (ref 22–32)
Calcium: 9 mg/dL (ref 8.9–10.3)
Chloride: 98 mmol/L — ABNORMAL LOW (ref 101–111)
Creatinine, Ser: 7.11 mg/dL — ABNORMAL HIGH (ref 0.61–1.24)
GFR calc Af Amer: 9 mL/min — ABNORMAL LOW (ref >60)
GFR calc non Af Amer: 7 mL/min — ABNORMAL LOW (ref >60)
GLUCOSE: 108 mg/dL — AB (ref 65–99)
PHOSPHORUS: 4.9 mg/dL — AB (ref 2.5–4.6)
POTASSIUM: 4.1 mmol/L (ref 3.5–5.1)
Sodium: 140 mmol/L (ref 135–145)

## 2015-07-23 LAB — CBC
HEMATOCRIT: 30.7 % — AB (ref 39.0–52.0)
HEMOGLOBIN: 8.8 g/dL — AB (ref 13.0–17.0)
MCH: 26 pg (ref 26.0–34.0)
MCHC: 28.7 g/dL — AB (ref 30.0–36.0)
MCV: 90.8 fL (ref 78.0–100.0)
Platelets: 235 10*3/uL (ref 150–400)
RBC: 3.38 MIL/uL — ABNORMAL LOW (ref 4.22–5.81)
RDW: 17.8 % — AB (ref 11.5–15.5)
WBC: 9.4 10*3/uL (ref 4.0–10.5)

## 2015-07-23 LAB — IRON AND TIBC
Iron: 35 ug/dL — ABNORMAL LOW (ref 45–182)
Saturation Ratios: 22 % (ref 17.9–39.5)
TIBC: 160 ug/dL — ABNORMAL LOW (ref 250–450)
UIBC: 125 ug/dL

## 2015-07-23 LAB — GLUCOSE, CAPILLARY
Glucose-Capillary: 113 mg/dL — ABNORMAL HIGH (ref 65–99)
Glucose-Capillary: 105 mg/dL — ABNORMAL HIGH (ref 65–99)
Glucose-Capillary: 90 mg/dL (ref 65–99)

## 2015-07-23 NOTE — Progress Notes (Addendum)
Social Work Patient ID: John [name], male   DOB: 1956/02/09, 60 y.o.   MRN: [id number] Team feels pt has reached his goals and ready for discharge tomorrow. He has been placed on first run for HD tomorrow. Have contacted [facility] regarding discharge tomorrow and made Referral for hospital bed and amputee pad for his wheelchair. Pt aware and working on getting himself a ride home, contacted wife to inform her of the change in plans. MD and PA aware of the new plan and agreeable.

## 2015-07-23 NOTE — Progress Notes (Addendum)
[facility] Progress Note   [facility] Rounding Note Subjective:  Up in chair, has been participating in therapy.  No particular c/o about his HD on Saturday Says working with PT to "get that leg to straighten out"    Objective Filed Vitals:   07/22/15 0500 07/22/15 0557 07/22/15 1444 07/23/15 0409  BP:  140/54 147/59 165/65  Pulse:  86 86 87  Temp:  98.2 F (36.8 C) 98.7 F (37.1 C) 98.2 F (36.8 C)  TempSrc:  Oral Oral Oral  Resp:   19 19  Height:      Weight: 73 kg (160 lb 15 oz)   73.5 kg (162 lb 0.6 oz)  SpO2:  98% 91% 96%   Physical Exam General: cooperative, NAD Heart: S1, S2, Regular. No audible murmur Lungs: clear to thebases Abdomen: soft nontender Extremities: L BKA in immobilizing device, no RLE edema.  Hands are darkened (primarily the left).   Additional Objective Labs: Basic Metabolic Panel:  Recent Labs Lab 07/17/15 1700 07/19/15 1704 07/21/15 1545  NA 137 137 131*  K 4.4 4.0 3.7  CL 96* 93* 88*  CO2 24 26 27   GLUCOSE 104* 86 123*  BUN 65* 55* 46*  CREATININE 11.27* 9.78* 7.97*  CALCIUM 9.3 9.0 8.9  PHOS 7.2* 6.5* 5.7*     Recent Labs Lab 07/17/15 1700 07/19/15 1704 07/21/15 1545  ALBUMIN 2.9* 3.0* 3.0*     Recent Labs Lab 07/17/15 0633 07/17/15 1700 07/19/15 1705 07/21/15 1545  WBC 8.5 8.5 8.0 9.4  HGB 9.9* 9.1* 9.3* 9.1*  HCT 32.1* 29.9* 30.0* 30.6*  MCV 88.9 87.9 90.6 88.2  PLT 191 224 212 228       Component Value Date/Time   SDES BLOOD RIGHT HAND 01/19/2015 2228   SPECREQUEST BOTTLES DRAWN AEROBIC ONLY 5CC 01/19/2015 2228   CULT NO GROWTH 5 DAYS 01/19/2015 2228   REPTSTATUS 01/25/2015 FINAL 01/19/2015 2228     Recent Labs Lab 07/22/15 0704 07/22/15 1138 07/22/15 1650 07/22/15 2107 07/23/15 0638  GLUCAP 73 82 163* 122* 113*   Medications:   . aspirin EC  81 mg Oral Daily  . atorvastatin  40 mg Oral q1800  . clopidogrel  75 mg Oral Daily  . darbepoetin (ARANESP) injection - DIALYSIS  150 mcg  Intravenous Q Thu-HD  . docusate sodium  100 mg Oral BID  . DULoxetine  20 mg Oral Daily  . enoxaparin (LOVENOX) injection  30 mg Subcutaneous QHS  . feeding supplement (NEPRO CARB STEADY)  237 mL Oral BID BM  . fentaNYL  25 mcg Transdermal Q72H  . ferric gluconate (FERRLECIT/NULECIT) IV  62.5 mg Intravenous Weekly  . hydrocerin   Topical BID  . insulin aspart  0-5 Units Subcutaneous QHS  . insulin aspart  0-9 Units Subcutaneous TID WC  . insulin glargine  5 Units Subcutaneous Daily  . lanthanum  1,000 mg Oral TID WC  . methocarbamol  500 mg Oral QID  . multivitamin  1 tablet Oral QHS    Dialysis Access: LUA AVF + thrill + bruit  Dialysis Orders: [facility] TTS TueThuSat, 4 hrs 0 min, 180NRe Optiflux, BFR 400, DFR Manual 800 mL/min, EDW 80 (kg), Dialysate 2.0 K, 2.0 Ca, UFR Profile: None, Sodium Model: None, Access: LUA AV Fistula Heparin: 7500 units per treatment MWF Mircera: 100 mcg IV q 2 weeks ( last dose 07/01/14) Venofer: 50 mg IV weekly (last dose 07/05/15)  Assessment:  1. Left BKA: admitted for rehab. Per primary. 2. ESRD -  TTS at [facility]. Has been under EDW (which of course is lower b/c of the BKA anyway) so need to establish new one.  Appears fairly euvolemic at this time.  Not getting pre and post weights in HD as desired. Next HD tomorrow. Use weight of 73.5 kg as starting point and take ouof UF if severe cramping.  3. Hypertension/volume -  As above 4. Anemia - last Hb 9.1 2/11 Will check tomorrow along with Fe studies. Had 150 of aranesp on 2/9 plus his weekly Fe dose.  5. Metabolic bone disease - Continue binders. Intermittently refusing fosrenol - takes about 1 dose/day 6. Nutrition - Albumin 2.7 Renal diet, add nepro/renal vits.  7. DM: per primary   Jamal Maes, MD [facility] [phone] Pager 07/23/2015, 11:59 AM  Add: I erroneously wrote HD orders dated for today rather than tomorrow - he will get a full treatment today - OK then for discharge  tomorrow and he can go to his outpt unit on Thursday.  New EDW will be 73.5 kg.  Jamal Maes, MD [facility] [phone] Pager 07/23/2015, 5:47 PM

## 2015-07-23 NOTE — Progress Notes (Signed)
Occupational Therapy Discharge Summary  Patient Details  Name: John [name] MRN: [id number] Date of Birth: 1956-05-17  Patient has met 8 of 9 long term goals due to improved activity tolerance, improved balance and ability to compensate for deficits.  Patient to discharge at overall Modified Independent level at w/c level, supervision during bathroom transfers.  Patient's care partner is independent to provide the necessary physical assistance at discharge.    Reasons goals not met: Able to negotiate homemaking assist with spouse  Recommendation:  Patient will not require ongoing skilled OT services to continue to advance functional skills in the area of iADL.  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) Patient Visual Report: Other (comment) (childhood injury to left eye) Vision- Assessment Vision Assessment?: Yes Additional Comments: Compensates well for left eye blindness since childhood Perception Comments: WFL   Cognition Overall Cognitive Status: Within Functional Limits for tasks assessed Arousal/Alertness: Awake/alert Orientation Level: Oriented X4 Attention: Alternating Alternating Attention: Appears intact Memory: Appears intact Awareness: Appears intact Problem Solving: Appears intact Safety/Judgment: Appears intact  Sensation Sensation Light Touch: Impaired by gross assessment Light Touch Impaired Details: Impaired LUE Stereognosis: Impaired by gross  assessment Stereognosis Impaired Details: Impaired LUE Hot/Cold: Appears Intact Proprioception: Appears Intact Coordination Gross Motor Movements are Fluid and Coordinated: Yes Fine Motor Movements are Fluid and Coordinated: Yes (WFL for performance of BADL)  Motor  Motor Motor: Other (comment) Motor - Discharge Observations: MM spasms LLE improved but still reports intermittent "cramping", general weakness  Mobility  Bed Mobility Bed Mobility: Rolling Right;Rolling Left;Supine to Sit;Sit to Supine Rolling Right: 6: Modified independent (Device/Increase time) Rolling Left: 6: Modified independent (Device/Increase time) Supine to Sit: 6: Modified independent (Device/Increase time) Sit to Supine: 6: Modified independent (Device/Increase time)   Trunk/Postural Assessment  Cervical Assessment Cervical Assessment: Within Functional Limits Thoracic Assessment Thoracic Assessment: Within Functional Limits Lumbar Assessment Lumbar Assessment: Within Functional Limits Postural Control Postural Control: Within Functional Limits   Balance Balance Balance Assessed: Yes Static Sitting Balance Static Sitting - Level of Assistance: 6: Modified independent (Device/Increase time) Dynamic Sitting Balance Dynamic Sitting - Level of Assistance: 6: Modified independent (Device/Increase time) Static Standing Balance Static Standing - Balance Support: Right upper extremity supported;Left upper extremity supported Static Standing - Level of Assistance: 5: Stand by assistance Dynamic Standing Balance Dynamic Standing - Balance Support: Right upper extremity supported;Left upper extremity supported Dynamic Standing - Level of Assistance: 4: Min assist  Extremity/Trunk Assessment RUE Assessment RUE Assessment: Exceptions to [facility] RUE Strength RUE Overall Strength: Due to premorbid status LUE Assessment LUE Assessment: Exceptions to [facility] LUE Strength LUE Overall Strength: Due to premorbid  status   See Function Navigator for Current Functional Status.  [facility] 07/24/2015, 12:04 PM

## 2015-07-23 NOTE — Progress Notes (Signed)
Physical Therapy Discharge Summary  Patient Details  Name: John [name] MRN: [id number] Date of Birth: 1955/08/31  Today's Date: 07/23/2015 PT Individual Time: 7628-3151 and 1305-1340 PT Individual Time Calculation (min): 59 min and 35 min   Patient has met 10 of 11 long term goals due to improved activity tolerance, improved balance, increased strength, increased range of motion and decreased pain.  Patient to discharge at a wheelchair level Modified Independent with therapy recommending that pt only perform gait at home with HHPT due pt continuing to require hands on min A for safe ambulation.   Patient's care partner is independent to provide the necessary supervision assistance at discharge.  Reasons goals not met: Pt did not reach supervision level for dynamic standing balance; pt required min A for balance and prevention of fall  Recommendation:  Patient will benefit from ongoing skilled PT services in home health setting to continue to advance safe functional mobility, address ongoing impairments in UE and LE weakness, L residual limb pain and decreased ROM, impaired standing balance and endurance, impaired gait, and minimize fall risk.  Equipment: hospital bed, amputee support pad for current manual w/c  Reasons for discharge: treatment goals met and discharge from hospital  Patient/family agrees with progress made and goals achieved: Yes  PT Discharge AM session: Patient demonstrated all bed mobility on flat bed and hospital bed Mod I, transfers w/c <> mat squat pivot Mod I and w/c mobility in controlled and home environment Mod I with supervision required for w/c mobility in community environment and up/down ramp.  Pt participated in reassessment of LE ROM and strength with improvement in LLE knee extension ROM and improvements in pain.  Performed gait with RW x 25' as documented below.  Pt transitioned to prone on mat and performed prone stretching of LLE hip flexors and  hamstrings as well as focus on activation of glutes and open chain hip extension.  Returned to room and pt set up with lunch.  Pt demonstrating fast progress and at goal level today; discussed with pt and team D/C home one day early.  Pt and OT agreeable; will alert social worker.  Discussed equipment and f/u needs with pt and Education officer, museum.  PM session: Pt received in w/c.  Continued review of HEP handout with pt seated in w/c and supine in bed.  Pt return demonstrated all exercises with min-mod verbal cues for technique.  At end of session pt left in w/c with hand off to OT.    Vital Signs Therapy Vitals Temp: 97.9 F (36.6 C) Temp Source: Oral Pulse Rate: 86 Resp: 16 BP: (!) 144/74 mmHg Patient Position (if appropriate): Lying Oxygen Therapy SpO2: 97 % O2 Device: Not Delivered Pain Pain Assessment Pain Assessment: 0-10 Pain Score: 7  Pain Type: Acute pain Pain Location: Leg Pain Orientation: Left Pain Descriptors / Indicators: Aching;Burning Pain Frequency: Constant Pain Onset: On-going Patients Stated Pain Goal: 4 Pain Intervention(s): Repositioned;Rest;Relaxation Cognition Orientation Level: Oriented X4 Sensation Sensation Light Touch: Impaired by gross assessment Stereognosis: Not tested Hot/Cold: Not tested Proprioception: Not tested Coordination Gross Motor Movements are Fluid and Coordinated: Not tested Fine Motor Movements are Fluid and Coordinated: Not tested Motor  Motor Motor - Discharge Observations: MM spasms LLE improved but still reports intermittent "cramping", general weakness  Mobility Bed Mobility Rolling Right: 6: Modified independent (Device/Increase time) Rolling Left: 6: Modified independent (Device/Increase time) Supine to Sit: 6: Modified independent (Device/Increase time) Sit to Supine: 6: Modified independent (Device/Increase time) Transfers Squat Pivot  Transfers: 6: Modified independent (Device/Increase time) Locomotion   Ambulation Ambulation/Gait Assistance: 4: Min assist Ambulation Distance (Feet): 25 Feet Assistive device: Rolling walker Ambulation/Gait Assistance Details: Pt performed gait x 25' in controlled environment with improved use of UE extension to advance RLE until pt fatigues and then demonstrates increased R foot dragging and knee flexion in stance; therapy recommending at this time that patient remain w/c level at home and perform gait training with HHPT only. Stairs / Additional Locomotion Stairs: No Architect: Yes Wheelchair Assistance: 6: Modified independent (Device/Increase time) Environmental health practitioner: Both upper extremities;Right lower extremity Wheelchair Parts Management: Independent Distance: 150  Trunk/Postural Assessment  Cervical Assessment Cervical Assessment: Within Functional Limits Thoracic Assessment Thoracic Assessment: Within Functional Limits Lumbar Assessment Lumbar Assessment: Within Functional Limits Postural Control Postural Control:  (impaired balance reactions in standing)  Balance Static Sitting Balance Static Sitting - Level of Assistance: 6: Modified independent (Device/Increase time) Dynamic Sitting Balance Dynamic Sitting - Level of Assistance: 6: Modified independent (Device/Increase time) Static Standing Balance Static Standing - Balance Support: Right upper extremity supported;Left upper extremity supported Static Standing - Level of Assistance: 5: Stand by assistance Dynamic Standing Balance Dynamic Standing - Balance Support: Right upper extremity supported;Left upper extremity supported Dynamic Standing - Level of Assistance: 4: Min assist Extremity Assessment  RLE Assessment RLE Assessment: Within Functional Limits (hip flexion remains 4-/5) LLE AROM (degrees) Left Knee Extension: -40 (in sitting; -16 in prone) LLE Strength LLE Overall Strength: Deficits;Due to pain LLE Overall Strength Comments: 4/5 hip  flexion, 3/5 knee extension within available range, 3/5 knee flexion-limited by pain   See Function Navigator for Current Functional Status.  Raylene Everts Faucette 07/23/2015, 4:29 PM

## 2015-07-23 NOTE — Progress Notes (Signed)
[facility] PHYSICAL MEDICINE & REHABILITATION     PROGRESS NOTE  Subjective/Complaints:  Patient resting comfortably in bed this morning. He notes decrease in pain to stump.  ROS:  Denies stump pain, CP, SOB, n/v/d.   Objective: Vital Signs: Blood pressure 165/65, pulse 87, temperature 98.2 F (36.8 C), temperature source Oral, resp. rate 19, height 5\' 9"  (1.753 m), weight 73.5 kg (162 lb 0.6 oz), SpO2 96 %. No results found.  Recent Labs  07/21/15 1545  WBC 9.4  HGB 9.1*  HCT 30.6*  PLT 228    Recent Labs  07/21/15 1545  NA 131*  K 3.7  CL 88*  GLUCOSE 123*  BUN 46*  CREATININE 7.97*  CALCIUM 8.9   CBG (last 3)   Recent Labs  07/22/15 1650 07/22/15 2107 07/23/15 0638  GLUCAP 163* 122* 113*    Wt Readings from Last 3 Encounters:  07/23/15 73.5 kg (162 lb 0.6 oz)  06/13/15 82.419 kg (181 lb 11.2 oz)  01/19/15 86.4 kg (190 lb 7.6 oz)    Physical Exam:  BP 165/65 mmHg  Pulse 87  Temp(Src) 98.2 F (36.8 C) (Oral)  Resp 19  Ht 5\' 9"  (1.753 m)  Wt 73.5 kg (162 lb 0.6 oz)  BMI 23.92 kg/m2  SpO2 96% Constitutional: He appears well-developed and well-nourished.  NAD HENT: Atraumatic.  Mouth/Throat: Poor dentition  Eyes: Lids are normal. Left eye exhibits abnormal extraocular motion. 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  Cardiovascular: Normal rate and regular rhythm. Murmur heard. Respiratory: Effort normal and breath sounds normal. No respiratory distress. He exhibits no tenderness.  GI: Soft. Bowel sounds are normal. He exhibits no distension. There is no tenderness. There is no guarding.  Musculoskeletal: He exhibits no edema  L-BKA with edema and sutures intact. Residual limb flexed with inability to fully extend, compliant with immobilizer.  Fingers b/l atrophic and ischemic appearing on left.  Left knee with DTI on patella (stable).  Knee with arthritic changes and tight hamstrings.  Neurological: He is alert and oriented. Right exopthalmic.  He  was able to follow simple motor commands without difficulty.  Motor: RUE: 5/5 LUE 4+/5 LLE: hip flexion 4/5 RLE: 4+/5 proximal to distal  Skin: Skin is warm and dry. He is not diaphoretic. Small area on central stump open with minimal drainage. Ischemic changes as noted  Psychiatric: His speech is normal. Mood/affect normal.   Assessment/Plan: 1. Functional deficits secondary to left BKA which require 3+ hours per day of interdisciplinary therapy in a comprehensive inpatient rehab setting. Physiatrist is providing close team supervision and 24 hour management of active medical problems listed below. Physiatrist and rehab team continue to assess barriers to discharge/monitor patient progress toward functional and medical goals.  Function:  Bathing Bathing position Bathing activity did not occur: Refused Position: Shower  Bathing parts Body parts bathed by patient: Right arm, Left arm, Chest, Abdomen, Front perineal area, Buttocks, Right upper leg, Left upper leg, Right lower leg, Back    Bathing assist Assist Level: No help, No cues   Set up : To obtain items  Upper Body Dressing/Undressing Upper body dressing Upper body dressing/undressing activity did not occur: Refused What is the patient wearing?: Pull over shirt/dress     Pull over shirt/dress - Perfomed by patient: Thread/unthread right sleeve, Thread/unthread left sleeve, Put head through opening, Pull shirt over trunk Pull over shirt/dress - Perfomed by helper: Pull shirt over trunk Button up shirt - Perfomed by patient: Thread/unthread right sleeve, Thread/unthread left  sleeve, Pull shirt around back, Button/unbutton shirt      Upper body assist Assist Level: Set up   Set up : To obtain clothing/put away  Lower Body Dressing/Undressing Lower body dressing   What is the patient wearing?: Underwear, Socks, Shoes Underwear - Performed by patient: Thread/unthread right underwear leg, Thread/unthread left underwear leg,  Pull underwear up/down   Pants- Performed by patient: Thread/unthread right pants leg, Thread/unthread left pants leg, Pull pants up/down, Fasten/unfasten pants Pants- Performed by helper: Pull pants up/down   Non-skid slipper socks- Performed by helper: Don/doff right sock Socks - Performed by patient: Don/doff right sock   Shoes - Performed by patient: Don/doff right shoe, Fasten right            Lower body assist Assist for lower body dressing: Set up   Set up : To obtain clothing/put away  Toileting Toileting Toileting activity did not occur: No continent bowel/bladder event        Toileting assist     Transfers Chair/bed transfer   Chair/bed transfer method: Squat pivot Chair/bed transfer assist level: Supervision or verbal cues Chair/bed transfer assistive device: Armrests     Locomotion Ambulation     Max distance: 12 Assist level: Touching or steadying assistance (Pt > 75%)   Wheelchair   Type: Manual Max wheelchair distance: 150 Assist Level: No help, No cues, assistive device, takes more than reasonable amount of time  Cognition Comprehension Comprehension assist level: Follows basic conversation/direction with extra time/assistive device  Expression Expression assist level: Expresses basic needs/ideas: With extra time/assistive device  Social Interaction Social Interaction assist level: Interacts appropriately with others - No medications needed.  Problem Solving Problem solving assist level: Solves basic 90% of the time/requires cueing < 10% of the time  Memory Memory assist level: Recognizes or recalls 90% of the time/requires cueing < 10% of the time    Medical Problem List and Plan: 1. Abnormality of gait, post-operative pain secondary to left BKA on 1/30. Clean amputation daily with soap and water Monitor incision site for signs of infection or impending skin breakdown. Staples to remain in place for 3-4 weeks Stump shrinker, for edema  control Scar mobilization massaging to prevent soft tissue adherence Stump protector during therapies Prevent flexion contractures by implementing the following:   Encourage prone lying for 20-30 mins per day BID to avoid hip flexion contractures if medically appropriate;  Avoid pillow under knees when patient is lying in bed in order to prevent both  knee and hip flexion contractures;  Avoid prolonged sitting When using wheelchair, patient should have knee on amputated side fully extended with board under the seat cushion. Avoid injury to contralateral side Knee immobilizer place, it does not appear to be causing any areas of excessive pressure or be causing excessive pressure and previous DTIs, will continue to monitor Will also consider serial casting in future 2. DVT Prophylaxis/Anticoagulation: Pharmaceutical: Lovenox 3. Pain Management: Fentanyl titrated to 25 mcg/ 72 hrs on 02/04--Continue fentanyl patch with prn oxycodone.   Dysesthesias - mainly with HD   Desensitization techniques implemented by therapies 4. Mood: Team to provide ego support. LCSW to follow for evaluation and support.  5. Neuropsych: This patient is capable of making decisions on his own behalf. 6. Skin/Wound Care: Monitor wound daily.  7. Fluids/Electrolytes/Nutrition: Strict I/O. 1200 cc FR/daily 8. PAD: Continue ASA/plavix. 9. ESRD: On HD T, Th, Sa-schedule procedure after therapy session to help with activity tolerance during the day. Daily weights. Strict I/O.  10 DM type 2: Monitor BS with ac/hs checks. Continue SSI for tighter BS control. Continue to hold lantus.   Fasting CBP 113 this AM 11. Gout in left Knee pain: Appears resolved  Xray reviewed, noting soft tissue swelling  One time dose of colchicine on 2/7  Pain continues to improve 12. Bowel: Pt notes diarrhea and constipation at times.    Stable at present 13. Acute on chronic anemia  Hb 9.1 and 2/11  Will cont to monitor  LOS (Days)  7 A FACE TO FACE EVALUATION WAS PERFORMED  Tadd Holtmeyer Lorie Phenix 07/23/2015 8:53 AM

## 2015-07-23 NOTE — Progress Notes (Signed)
Occupational Therapy Session Note  Patient Details  Name: John [name] MRN: [id number] Date of Birth: 1956-02-18  Today's Date: 07/23/2015 OT Individual Time: 0845-1000 OT Individual Time Calculation (min): 75 min    Short Term Goals: Week 1:  OT Short Term Goal 1 (Week 1): STG = LTG d/t short LOS  Skilled Therapeutic Interventions/Progress Updates: ADL-retraining at shower level with focus on dynamic standing balance, adapted dressing skills, and transfer safety.   Pt completes BADL session at overall independent level, modified by extra time and use of DME (w/c, shower chair, grab bars).   Pt reports possession of similar DME at his home and he is able to stand supporting himself by leaning into secure equipment or furniture safely to enable pulling up his pants.   Pt performs transfers unassisted with good technique although recommended for supervision during tub transfers d/t decreased sensation at RLE.   Pt groomed at sink unassisted and was left in his w/c at end of session with all needs within reach.     Therapy Documentation Precautions:  Precautions Precautions: Fall Restrictions Weight Bearing Restrictions: Yes LLE Weight Bearing: Non weight bearing   Pain: Pain Assessment Pain Assessment: 0-10 Pain Score: 4   ADL: ADL ADL Comments: see Functional Assessment Tool   Exercises:     Other Treatments:    See Function Navigator for Current Functional Status.   Therapy/Group: Individual Therapy   Second session: Time: 1330-1400 Time Calculation (min):  30 min   Pain Assessment: No/denies pain  Skilled Therapeutic Interventions: Therapeutic activity with focus on w/c mobility, discharge planning, and performance of HEP to strengthen BUE.   Pt escorted to gym and challenged to demonstrate ability to manage w/c up and down therapy ramp.   Pt required 3 attempts to generate sufficient force to overcome threshold of ramp but proceeded to top of ramp and then slowly  descended ramp unassisted..  Pt returned to his room and completed 3 of 3 exercises provided (diagonal arm raise, straight tricep push down, and horizontal abduction) following written HEP provided.   See FIM for current functional status  Therapy/Group: Individual Therapy  [place] 07/23/2015, 12:32 PM

## 2015-07-24 DIAGNOSIS — S88112D Complete traumatic amputation at level between knee and ankle, left lower leg, subsequent encounter: Secondary | ICD-10-CM

## 2015-07-24 LAB — GLUCOSE, CAPILLARY
GLUCOSE-CAPILLARY: 80 mg/dL (ref 65–99)
GLUCOSE-CAPILLARY: 84 mg/dL (ref 65–99)

## 2015-07-24 MED ORDER — HYDROCERIN EX CREA: 1.0000 "application " | TOPICAL_CREAM | Freq: Two times a day (BID) | CUTANEOUS | Status: AC

## 2015-07-24 MED ORDER — METHOCARBAMOL 500 MG PO TABS: 500.0000 mg | ORAL_TABLET | Freq: Four times a day (QID) | ORAL | Status: AC | PRN

## 2015-07-24 MED ORDER — INSULIN GLARGINE 100 UNIT/ML ~~LOC~~ SOLN: 5.0000 [IU] | Freq: Every day | SUBCUTANEOUS | Status: AC

## 2015-07-24 MED ORDER — FENTANYL 12 MCG/HR TD PT72: 12.0000 ug | MEDICATED_PATCH | TRANSDERMAL | Status: AC

## 2015-07-24 MED ORDER — DULOXETINE HCL 20 MG PO CPEP: 20.0000 mg | ORAL_CAPSULE | Freq: Every day | ORAL | Status: DC

## 2015-07-24 MED ORDER — RENA-VITE PO TABS: 1.0000 | ORAL_TABLET | Freq: Every day | ORAL | Status: DC

## 2015-07-24 MED ORDER — INSULIN GLARGINE 100 UNIT/ML ~~LOC~~ SOLN: 5.0000 [IU] | Freq: Every day | SUBCUTANEOUS | Status: DC

## 2015-07-24 MED ORDER — DOCUSATE SODIUM 100 MG PO CAPS: 100.0000 mg | ORAL_CAPSULE | Freq: Two times a day (BID) | ORAL | Status: DC

## 2015-07-24 NOTE — Progress Notes (Signed)
[facility] Progress Note   [facility] Rounding Note Subjective:  Says going home today Had HD yesterday afternoon off schedule  Appears that new EDW will be about 73.5-74 kg WITH the immobilizer device on    Objective Filed Vitals:   07/23/15 1900 07/23/15 1930 07/23/15 2000 07/24/15 0547  BP: 191/69 140/72 150/80 152/76  Pulse: 87 95 90 90  Temp:   97.7 F (36.5 C) 99 F (37.2 C)  TempSrc:   Oral Oral  Resp:   16 18  Height:      Weight:      SpO2:    96%   Physical Exam General: Anxious to get dressings back on leg and get his wheelchair Heart: S1, S2, Regular. No audible murmur Lungs: clear to thebases Abdomen: soft nontender Extremities: L BKA sutures intact. Has a small skin tear on the dorsum of his knee. Has another lesion covered and I did not remove the dressing No RLE edema.  Hands are darkened (primarily the left).   Additional Objective Labs: Basic Metabolic Panel:  Recent Labs Lab 07/19/15 1704 07/21/15 1545 07/23/15 1616  NA 137 131* 140  K 4.0 3.7 4.1  CL 93* 88* 98*  CO2 26 27 29   GLUCOSE 86 123* 108*  BUN 55* 46* 39*  CREATININE 9.78* 7.97* 7.11*  CALCIUM 9.0 8.9 9.0  PHOS 6.5* 5.7* 4.9*     Recent Labs Lab 07/19/15 1704 07/21/15 1545 07/23/15 1616  ALBUMIN 3.0* 3.0* 2.7*     Recent Labs Lab 07/17/15 1700 07/19/15 1705 07/21/15 1545 07/23/15 1616  WBC 8.5 8.0 9.4 9.4  HGB 9.1* 9.3* 9.1* 8.8*  HCT 29.9* 30.0* 30.6* 30.7*  MCV 87.9 90.6 88.2 90.8  PLT 224 212 228 235       Component Value Date/Time   SDES BLOOD RIGHT HAND 01/19/2015 2228   SPECREQUEST BOTTLES DRAWN AEROBIC ONLY 5CC 01/19/2015 2228   CULT NO GROWTH 5 DAYS 01/19/2015 2228   REPTSTATUS 01/25/2015 FINAL 01/19/2015 2228     Recent Labs Lab 07/22/15 2107 07/23/15 0638 07/23/15 1207 07/23/15 2050 07/24/15 0632  GLUCAP 122* 113* 105* 90 80   Medications:   . aspirin EC  81 mg Oral Daily  . atorvastatin  40 mg Oral q1800  . clopidogrel   75 mg Oral Daily  . darbepoetin (ARANESP) injection - DIALYSIS  150 mcg Intravenous Q Thu-HD  . docusate sodium  100 mg Oral BID  . DULoxetine  20 mg Oral Daily  . enoxaparin (LOVENOX) injection  30 mg Subcutaneous QHS  . feeding supplement (NEPRO CARB STEADY)  237 mL Oral BID BM  . fentaNYL  25 mcg Transdermal Q72H  . ferric gluconate (FERRLECIT/NULECIT) IV  62.5 mg Intravenous Weekly  . hydrocerin   Topical BID  . insulin aspart  0-5 Units Subcutaneous QHS  . insulin aspart  0-9 Units Subcutaneous TID WC  . insulin glargine  5 Units Subcutaneous Daily  . lanthanum  1,000 mg Oral TID WC  . methocarbamol  500 mg Oral QID  . multivitamin  1 tablet Oral QHS    Dialysis Access: LUA AVF + thrill + bruit  Dialysis Orders: [facility] TTS TueThuSat, 4 hrs 0 min, 180NRe Optiflux, BFR 400, DFR Manual 800 mL/min,  EDW 80 (kg) NEW EDW OF 74 KG AT DISCHARGE - WITH KNEE IMMOBILIZER IN PLACE Dialysate 2.0 K, 2.0 Ca, UFR Profile: None, Sodium Model: None, Access: LUA AV Fistula Heparin: 7500 units per treatment MWF Mircera: 100 mcg IV  q 2 weeks ( last dose 07/01/14) Venofer: 50 mg IV weekly (last dose 07/05/15)  Assessment:  1. Left BKA (AT [facility]): admitted for rehab. Anticipate D/C today 2. ESRD - TTS at [facility]. Had HD off schedule yesterday but still fine for D/C and can just go to his unit on Thursday.  Has been under EDW (which of course is lower b/c of the BKA anyway) New EDW 73.5-74 kg (I think 59 WITH THE IMMOBILIZER DEVICE IN PLACE - still had cramping at the end of HD yesterday) Looks euvolemic at this time..  3. Hypertension/volume -  As above 4. Anemia - Hb yesterday 8.8  Had 150 of aranesp on 2/9 plus his weekly Fe dose. TSat yesterday down to 22%. Plan 5 dose Fe load on return to outpt unit. Will recommend increase in Mircera to 150 and ask them to dose on Thursday. 5. Metabolic bone disease - Continue binders. Intermittently refusing fosrenol - takes about 1 dose/day 6. Nutrition -  Albumin 2.7 Renal diet, add nepro/renal vits.  7. DM: per primary   Jamal Maes, MD [facility] [phone] Pager 07/24/2015, 9:55 AM

## 2015-07-24 NOTE — Progress Notes (Signed)
Social Work  Discharge Note  The overall goal for the admission was met for:   Discharge location: Yes-HOME WITH WIFE WHO CAN PROVIDE SUPERVISION LEVEL ONLY  Length of Stay: Yes-8 DAYS  Discharge activity level: Yes-MOD/I WHEELCHAIR LEVEL  Home/community participation: Yes  Services provided included: MD, RD, PT, OT, RN, CM, TR, Pharmacy and SW  Financial Services: Medicaid and Private Insurance: [facility]   Follow-up services arranged: Home Health: [facility]-PT,OT,RN, DME: [facility], [facility] PAD and Patient/Family request agency HH: [facility], DME: HAS DME FROM THEM  Comments (or additional information):PT MET HIS GOALS MORE QUICKLY AND IS [place]. PCS SERVICES BEING ARRANGED FOR ADDED ASSIST AT HOME. WIFE RECEIVES THESE SERVICES ALSO  Patient/Family verbalized understanding of follow-up arrangements: Yes  Individual responsible for coordination of the follow-up plan: SELF & FRAN-WIFE  Confirmed correct DME delivered: Elease Hashimoto 07/24/2015    Elease Hashimoto

## 2015-07-24 NOTE — Discharge Instructions (Signed)
Inpatient Rehab Discharge Instructions  John [name] Discharge date and time:  07/24/15  Activities/Precautions/ Functional Status: Activity: activity as tolerated Limit to transfers only at this time.  Diet: diabetic diet and renal diet Wound Care:  Wash with soap and water. Pat dry and apply dry dressing.  Wear limb protector during the day to keep leg straight.   Functional status:  ___ No restrictions     ___ Walk up steps independently ___ 24/7 supervision/assistance   ___ Walk up steps with assistance _X__ Intermittent supervision/assistance  ___ Bathe/dress independently ___ Walk with walker     ___ Bathe/dress with assistance ___ Walk Independently    ___ Shower independently ___ Walk with assistance    ___ Shower with assistance ___ No alcohol     ___ Return to work/school ________  Special Instructions: 1. Walk with therapy only.  2. Check blood sugars 2-3 times a day before meals. Follow up with Dr. Nori Riis for further adjustment.  3. You need to use lantus daily.     COMMUNITY REFERRALS UPON DISCHARGE:    Home Health:   PT, OT, [place] Phone: [phone]   Date of last service:07/24/2015  Medical Equipment/Items Ordered:L-AMPUTEE [place]   [phone] Other:PCS [place] RESOURCES FOR PATIENT/FAMILY: Support Groups:AMP[employer]EE SUPPORT GROUP SECOND Tuesday OF [place] @ 7:00-8:30 PM [place]   My questions have been answered and I understand these instructions. I will adhere to these goals and the provided educational materials after my discharge from the hospital.  Patient/Caregiver Signature _______________________________ Date __________  Clinician Signature _______________________________________ Date __________  Please bring this form and your medication list with you to all your follow-up doctor's appointments.

## 2015-07-24 NOTE — Progress Notes (Signed)
Patient has current Flu vaccine and had Pneumonia vaccine in 2001. Patient declined to have another Pneumonia vaccine at this time.

## 2015-07-24 NOTE — Plan of Care (Signed)
Problem: RH SKIN INTEGRITY Goal: RH STG SKIN FREE OF INFECTION/BREAKDOWN Skin free of infection/breakdown at discharge with supervision  Outcome: Adequate for Discharge Small skin tear on dorsal surface of L knee. Surgical incision progressing

## 2015-07-24 NOTE — Discharge Summary (Addendum)
Physician Discharge Summary  Patient ID: GREGARY WOODLAND MRN: [id number] DOB/AGE: 1955-10-20 60 y.o.  Admit date: July 20, 2015 Discharge date: 07/24/2015  Discharge Diagnoses:  Principal Problem:   Unilateral complete BKA ([facility]) Active Problems:   Post-operative pain   Abnormality of gait   ESRD on dialysis ([facility])   Type 2 diabetes mellitus with complication, with long-term current use of insulin ([facility])   Left knee pain   Diarrhea   Acute blood loss anemia   Anemia of chronic disease   Dysesthesia   Discharged Condition: Stable  Significant Diagnostic Studies: Dg Knee 1-2 Views Left  20-Jul-2015  CLINICAL DATA:  Rest pain and edema. Left below-the-knee amputation on 07/09/2015. EXAM: LEFT KNEE - 1-2 VIEW COMPARISON:  Knee radiographs of 08/31/2011 FINDINGS: Extensive vascular calcifications. Below-the-knee amputation. Soft tissue swelling about the residual peripheral lower extremity. No soft tissue gas or unexpected radiopaque foreign object. No osseous destruction. IMPRESSION: Interval below the knee amputation, with nonspecific soft tissue edema. Electronically Signed   By: Abigail Miyamoto M.D.   On: 07/20/15 18:27    Labs:  Basic Metabolic Panel:  Recent Labs Lab 07/21/15 1545 07/23/15 1616  NA 131* 140  K 3.7 4.1  CL 88* 98*  CO2 27 29  GLUCOSE 123* 108*  BUN 46* 39*  CREATININE 7.97* 7.11*  CALCIUM 8.9 9.0  PHOS 5.7* 4.9*    CBC:  Recent Labs Lab 07/21/15 1545 07/23/15 1616  WBC 9.4 9.4  HGB 9.1* 8.8*  HCT 30.6* 30.7*  MCV 88.2 90.8  PLT 228 235    CBG:  Recent Labs Lab 07/23/15 0638 07/23/15 1207 07/23/15 2050 07/24/15 0632 07/24/15 1202  GLUCAP 113* 105* 90 80 84    Brief HPI:   Garek Dicker is a 60 year old male with history of COPD, chronic pian, ESRD, DM type 2 with peripheral neuropathy, SCI with ACDF, PAD with ongoing tobacco use s/p left transmetatarsal amputation due to refusal of BKA and SFA atherectomy. He had poor wound healing with  necrosis, rest pain and edema and was agreeable to undergo L-BKA on 07/09/15 by Dr. Donnelly Angelica at [facility]. Plavix resumed post op and left knee brace ordered to help keep LLE in extension due to ongoing issues with spasms. Therapy initiated with education ongoing on desensitization to help with neuropathic pain. Fentanyl patch added to help with pain control and cymbalta added to help manage neuropathic pain. He continues to have difficulty completing ADL tasks as well as problems with mobility and CIR was recommended for follow up therapy.   Hospital Course: KELLY MARGULIS was admitted to rehab Jul 20, 2015 for inpatient therapies to consist of PT and OT at least three hours five days a week. Past admission physiatrist, therapy team and rehab RN have worked together to provide customized collaborative inpatient rehab.  Blood pressures have been monitored on bid basis and have been controlled. Po intake has been good and blood sugars have been well controlled on current regimen of lantus 5 units daily with SSI used on ac/hs basis. Patient has been educated on diabetes monitoring as well as need for compliance with current regimen.   He continues to have moderate edema at L-BKA site with staples intact.  He has required extensive education on keeping residual limb extended out and was fitted with limb guard to help with extension.  Incision is clean, dry and intact.   He has had improvement in pain control and has been educated on desensitization techniques. Fentanyl  was decreased to 12.5 mcg at discharge.  Nutritional supplements were added to help with low protein stores. Anemia of chronic disease is stable and has been supplemented with IV Nulecit and aranesp 150 mcg on 02/09.  HD has been ongoing on Tue, Thu and Sat after therapy to help with toleranc of activity during the day. He has had issues with LLE spasms during HD.  New EDW anticipated to be 73.5- 74 kg.   He has made steady progress and is  modified independent at wheelchair level. He will continue to receive follow up HHPT and [place] by [place] after discharge.    Rehab course: During patient's stay in rehab weekly team conferences were held to monitor patient's progress, set goals and discuss barriers to discharge. At admission, patient required  Patient has had improvement in activity tolerance, balance, postural control, as well as ability to compensate for deficits. He is able to complete ADL tasks at wheelchair level and requires supervision with bathroom transfers.  He is modified independent for squat pivot transfers and is able to ambulate 25' with min assist in controlled environment. He has been instructed to ambulate with therapy only after discharge.    Disposition:  Home   Diet: diabetic diet/ Renal restrictions.   Special Instructions: 1. Walk with therapy only.  2. Check blood sugars 2-3 times a day before meals. Follow up with Dr. Nori Riis for further adjustment.  3. Use lantus daily.    Discharge Instructions    Ambulatory referral to Physical Medicine Rehab    Complete by:  As directed   4 week follow up after L-BKA. Needs MW or F            Medication List    STOP taking these medications        darbepoetin 100 MCG/0.5ML Soln injection  Commonly known as:  ARANESP      TAKE these medications        aspirin 81 MG chewable tablet  Chew 81 mg by mouth daily.     atorvastatin 40 MG tablet  Commonly known as:  LIPITOR  Take 40 mg by mouth every evening.     cholecalciferol 1000 units tablet  Commonly known as:  VITAMIN D  Take 1,000 Units by mouth daily.     clopidogrel 75 MG tablet  Commonly known as:  PLAVIX  Take 75 mg by mouth daily.     docusate sodium 100 MG capsule  Commonly known as:  COLACE  Take 1 capsule (100 mg total) by mouth 2 (two) times daily.     DULoxetine 20 MG capsule  Commonly known as:  CYMBALTA  Take 1 capsule (20 mg total) by mouth daily.     fentaNYL  12 MCG/HR--#5 patches  Commonly known as:  DURAGESIC - dosed mcg/hr  Place 1 patch (12.5 mcg total) onto the skin every 3 (three) days. Change patch tomorrow     hydrocerin Crea  Apply 1 application topically 2 (two) times daily.     insulin glargine 100 UNIT/ML injection  Commonly known as:  LANTUS  Inject 0.05 mLs (5 Units total) into the skin daily.     insulin lispro 100 UNIT/ML injection  Commonly known as:  HUMALOG  Inject 2-3 Units into the skin 2 (two) times daily as needed for high blood sugar (CBG >180). Per sliding scale: inject 2 units subcutaneously for CBG 180-299, inject 3 units for CBG >300     lanthanum 1000 MG chewable  tablet  Commonly known as:  FOSRENOL  Chew 1,000 mg by mouth See admin instructions. Take 1 tablet (1000 mg) by mouth with large meals (once every 2-3 days)     lidocaine-EPINEPHrine-tetracaine Gel  Commonly known as:  LET  Apply topically to left arm 1 hour before dialysis     lidocaine-prilocaine cream  Commonly known as:  EMLA  Apply 1 application topically 3 (three) times a week. Prior to dialysis     methocarbamol 500 MG tablet  Commonly known as:  ROBAXIN  Take 1 tablet (500 mg total) by mouth every 6 (six) hours as needed for muscle spasms.     multivitamin Tabs tablet  Take 1 tablet by mouth daily.     oxyCODONE-acetaminophen 10-325 MG tablet  Commonly known as:  PERCOCET  Take 1 tablet by mouth every 6 (six) hours as needed for pain.     PROAIR HFA 108 (90 Base) MCG/ACT inhaler  Generic drug:  albuterol  INHALE 2 PUFFS BY MOUTH EVERY 4 HOURS AS NEEDED FOR WHEEZING OR SHORTNESS OF BREATH       Follow-up Information    Follow up with [name], MD On 08/01/2015.   Specialties:  Family Medicine, Sports Medicine   Why:  APPT @ 10:15 AM   Contact information:   [address] [place] [address] [phone]       Follow up with Ankit Lorie Phenix, MD.   Specialty:  Physical Medicine and Rehabilitation   Why:  office  will call you with follow up appointment.    Contact information:   [address] [place] [address] [phone]       Follow up with Dr. Donnelly Angelica.   Why:  Appointment on Feb 20th at 10:30 am      Signed: Bary Leriche 07/27/2015, 9:02 AM

## 2015-07-24 NOTE — Progress Notes (Signed)
Patient discharged to home, transported by a friend

## 2015-07-24 NOTE — Progress Notes (Signed)
[facility]IC[facility] RE[facility]ILITATION     PROGRESS NOTE  Subjective/Complaints:  Patient resting comfortably in bed this morning about to receive his medications. He is amenable to going home or staying here, whatever the team thinks is best.    ROS:  Occasional stump pain.  CP, SOB, n/v/d.   Objective: Vital Signs: Blood pressure 152/76, pulse 90, temperature 99 F (37.2 C), temperature source Oral, resp. rate 18, height 5\' 9"  (1.753 m), weight 74.4 kg (164 lb 0.4 oz), SpO2 96 %. No results found.  Recent Labs  07/21/15 1545 07/23/15 1616  WBC 9.4 9.4  HGB 9.1* 8.8*  HCT 30.6* 30.7*  PLT 228 235    Recent Labs  07/21/15 1545 07/23/15 1616  NA 131* 140  K 3.7 4.1  CL 88* 98*  GLUCOSE 123* 108*  BUN 46* 39*  CREATININE 7.97* 7.11*  CALCIUM 8.9 9.0   CBG (last 3)   Recent Labs  07/23/15 1207 07/23/15 2050 07/24/15 0632  GLUCAP 105* 90 80    Wt Readings from Last 3 Encounters:  07/23/15 74.4 kg (164 lb 0.4 oz)  06/13/15 82.419 kg (181 lb 11.2 oz)  01/19/15 86.4 kg (190 lb 7.6 oz)    Physical Exam:  BP 152/76 mmHg  Pulse 90  Temp(Src) 99 F (37.2 C) (Oral)  Resp 18  Ht 5\' 9"  (1.753 m)  Wt 74.4 kg (164 lb 0.4 oz)  BMI 24.21 kg/m2  SpO2 96% Constitutional: He appears well-developed and well-nourished.  NAD HENT: Atraumatic.  Mouth/Throat: Poor dentition  Eyes: Lids are normal. Left eye exhibits abnormal extraocular motion. 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  Cardiovascular: Normal rate and regular rhythm. Murmur heard. Respiratory: Effort normal and breath sounds normal. No respiratory distress. He exhibits no tenderness.  GI: Soft. Bowel sounds are normal. He exhibits no distension. There is no tenderness. There is no guarding.  Musculoskeletal: He exhibits no edema  L-BKA with edema and sutures intact. Residual limb flexed with inability to fully extend, compliant with immobilizer.  Fingers b/l atrophic and ischemic appearing on left.  Left  knee with DTI on patella (healing).  Knee with arthritic changes and tight hamstrings.  Neurological: He is alert and oriented. Right exopthalmic.  He was able to follow simple motor commands without difficulty.  Motor: RUE: 5/5 LUE 4+/5 LLE: hip flexion 4/5 RLE: 4+/5 proximal to distal  Skin: Skin is warm and dry. He is not diaphoretic. Small area on central and lateral stump with minimal drainage. Skin tear in popliteal fossa on left. Ischemic changes as noted  Psychiatric: His speech is normal. Mood/affect normal.   Assessment/Plan: 1. Functional deficits secondary to left BKA which require 3+ hours per day of interdisciplinary therapy in a comprehensive inpatient rehab setting. Physiatrist is providing close team supervision and 24 hour management of active medical problems listed below. Physiatrist and rehab team continue to assess barriers to discharge/monitor patient progress toward functional and medical goals.  Function:  Bathing Bathing position Bathing activity did not occur: Refused Position: Shower  Bathing parts Body parts bathed by patient: Right arm, Left arm, Chest, Abdomen, Front perineal area, Buttocks, Right upper leg, Left upper leg, Right lower leg    Bathing assist Assist Level: No help, No cues   Set up : To obtain items  Upper Body Dressing/Undressing Upper body dressing Upper body dressing/undressing activity did not occur: Refused What is the patient wearing?: Pull over shirt/dress     Pull over shirt/dress - Perfomed by  patient: Thread/unthread right sleeve, Thread/unthread left sleeve, Put head through opening, Pull shirt over trunk Pull over shirt/dress - Perfomed by helper: Pull shirt over trunk Button up shirt - Perfomed by patient: Thread/unthread right sleeve, Thread/unthread left sleeve, Pull shirt around back, Button/unbutton shirt      Upper body assist Assist Level: More than reasonable time   Set up : To obtain clothing/put away  Lower  Body Dressing/Undressing Lower body dressing   What is the patient wearing?: Underwear, Pants, Socks, Shoes Underwear - Performed by patient: Thread/unthread right underwear leg, Thread/unthread left underwear leg, Pull underwear up/down   Pants- Performed by patient: Thread/unthread right pants leg, Thread/unthread left pants leg, Pull pants up/down, Fasten/unfasten pants Pants- Performed by helper: Pull pants up/down   Non-skid slipper socks- Performed by helper: Don/doff right sock Socks - Performed by patient: Don/doff right sock   Shoes - Performed by patient: Don/doff right shoe, Fasten right            Lower body assist Assist for lower body dressing: Set up, Touching or steadying assistance (Pt > 75%)   Set up : To obtain clothing/put away  Toileting Toileting Toileting activity did not occur: No continent bowel/bladder event Toileting steps completed by patient: Adjust clothing prior to toileting, Performs perineal hygiene, Adjust clothing after toileting Toileting steps completed by helper: Adjust clothing prior to toileting, Performs perineal hygiene, Adjust clothing after toileting (per Gilman Buttner, NT)    Toileting assist Assist level: More than reasonable time   Transfers Chair/bed transfer   Chair/bed transfer method: Squat pivot Chair/bed transfer assist level: No Help, no cues, assistive device, takes more than a reasonable amount of time Chair/bed transfer assistive device: Armrests     Locomotion Ambulation     Max distance: 25 Assist level: Touching or steadying assistance (Pt > 75%)   Wheelchair   Type: Manual Max wheelchair distance: 150 Assist Level: No help, No cues, assistive device, takes more than reasonable amount of time  Cognition Comprehension Comprehension assist level: Follows basic conversation/direction with extra time/assistive device  Expression Expression assist level: Expresses basic needs/ideas: With extra time/assistive  device  Social Interaction Social Interaction assist level: Interacts appropriately with others - No medications needed.  Problem Solving Problem solving assist level: Solves basic 90% of the time/requires cueing < 10% of the time  Memory Memory assist level: Recognizes or recalls 90% of the time/requires cueing < 10% of the time    Medical Problem List and Plan: 1. Abnormality of gait, post-operative pain secondary to left BKA on 1/30. Clean amputation daily with soap and water Monitor incision site for signs of infection or impending skin breakdown. Staples to remain in place for 3-4 weeks Stump shrinker, for edema control Scar mobilization massaging to prevent soft tissue adherence Stump protector during therapies Prevent flexion contractures by implementing the following:   Encourage prone lying for 20-30 mins per day BID to avoid hip flexion contractures if medically appropriate;  Avoid pillow under knees when patient is lying in bed in order to prevent both  knee and hip flexion contractures;  Avoid prolonged sitting When using wheelchair, patient should have knee on amputated side fully extended with board under the seat cushion. Avoid injury to contralateral side Knee immobilizer place, it does not appear to be causing any areas of excessive pressure or be causing excessive pressure and previous DTIs. Would consider serial casting as outpt 2. DVT Prophylaxis/Anticoagulation: Pharmaceutical: Lovenox 3. Pain Management: Fentanyl titrated to 25 mcg/ 72  hrs on 02/04--Continue fentanyl patch with prn oxycodone.   Dysesthesias - mainly with HD   Desensitization techniques implemented by therapies 4. Mood: Team to provide ego support. LCSW to follow for evaluation and support.  5. Neuropsych: This patient is capable of making decisions on his own behalf. 6. Skin/Wound Care: Monitor wound daily.  7. Fluids/Electrolytes/Nutrition: Strict I/O. 1200 cc FR/daily 8. PAD: Continue  ASA/plavix. 9. ESRD: On HD T, Th, Sa-schedule procedure after therapy session to help with activity tolerance during the day. Daily weights. Strict I/O.  10 DM type 2: Monitor BS with ac/hs checks. Continue SSI for tighter BS control. Continue to hold lantus.   Fasting CBP 80 this AM 11. Gout in left Knee pain: Appears resolved  Xray reviewed, noting soft tissue swelling  One time dose of colchicine on 2/7  Pain continues to improve 12. Bowel: Pt notes diarrhea and constipation at times.    Stable at present 13. Acute on chronic anemia  Hb 8.8 and 2/14  Will cont to monitor 14. Labile BP  Stable for the most part, elevated at times prior to HD  LOS (Days) 8 A FACE TO FACE EVALUATION WAS PERFORMED  Toriann Spadoni Lorie Phenix 07/24/2015 8:52 AM

## 2015-07-26 DIAGNOSIS — N2581 Secondary hyperparathyroidism of renal origin: Secondary | ICD-10-CM | POA: Diagnosis not present

## 2015-07-26 DIAGNOSIS — D689 Coagulation defect, unspecified: Secondary | ICD-10-CM | POA: Diagnosis not present

## 2015-07-26 DIAGNOSIS — D631 Anemia in chronic kidney disease: Secondary | ICD-10-CM | POA: Diagnosis not present

## 2015-07-26 DIAGNOSIS — D688 Other specified coagulation defects: Secondary | ICD-10-CM | POA: Diagnosis not present

## 2015-07-26 DIAGNOSIS — R52 Pain, unspecified: Secondary | ICD-10-CM | POA: Diagnosis not present

## 2015-07-26 DIAGNOSIS — D509 Iron deficiency anemia, unspecified: Secondary | ICD-10-CM | POA: Diagnosis not present

## 2015-07-26 DIAGNOSIS — E119 Type 2 diabetes mellitus without complications: Secondary | ICD-10-CM | POA: Diagnosis not present

## 2015-07-26 DIAGNOSIS — N186 End stage renal disease: Secondary | ICD-10-CM | POA: Diagnosis not present

## 2015-07-26 DIAGNOSIS — R197 Diarrhea, unspecified: Secondary | ICD-10-CM | POA: Diagnosis not present

## 2015-07-28 DIAGNOSIS — D689 Coagulation defect, unspecified: Secondary | ICD-10-CM | POA: Diagnosis not present

## 2015-07-28 DIAGNOSIS — E1122 Type 2 diabetes mellitus with diabetic chronic kidney disease: Secondary | ICD-10-CM | POA: Diagnosis not present

## 2015-07-28 DIAGNOSIS — N2581 Secondary hyperparathyroidism of renal origin: Secondary | ICD-10-CM | POA: Diagnosis not present

## 2015-07-28 DIAGNOSIS — N186 End stage renal disease: Secondary | ICD-10-CM | POA: Diagnosis not present

## 2015-07-28 DIAGNOSIS — E1151 Type 2 diabetes mellitus with diabetic peripheral angiopathy without gangrene: Secondary | ICD-10-CM | POA: Diagnosis not present

## 2015-07-28 DIAGNOSIS — E119 Type 2 diabetes mellitus without complications: Secondary | ICD-10-CM | POA: Diagnosis not present

## 2015-07-28 DIAGNOSIS — G8929 Other chronic pain: Secondary | ICD-10-CM | POA: Diagnosis not present

## 2015-07-28 DIAGNOSIS — R197 Diarrhea, unspecified: Secondary | ICD-10-CM | POA: Diagnosis not present

## 2015-07-28 DIAGNOSIS — Z992 Dependence on renal dialysis: Secondary | ICD-10-CM | POA: Diagnosis not present

## 2015-07-28 DIAGNOSIS — E785 Hyperlipidemia, unspecified: Secondary | ICD-10-CM | POA: Diagnosis not present

## 2015-07-28 DIAGNOSIS — Z89512 Acquired absence of left leg below knee: Secondary | ICD-10-CM | POA: Diagnosis not present

## 2015-07-28 DIAGNOSIS — R52 Pain, unspecified: Secondary | ICD-10-CM | POA: Diagnosis not present

## 2015-07-28 DIAGNOSIS — J449 Chronic obstructive pulmonary disease, unspecified: Secondary | ICD-10-CM | POA: Diagnosis not present

## 2015-07-28 DIAGNOSIS — E1140 Type 2 diabetes mellitus with diabetic neuropathy, unspecified: Secondary | ICD-10-CM | POA: Diagnosis not present

## 2015-07-28 DIAGNOSIS — D631 Anemia in chronic kidney disease: Secondary | ICD-10-CM | POA: Diagnosis not present

## 2015-07-28 DIAGNOSIS — D649 Anemia, unspecified: Secondary | ICD-10-CM | POA: Diagnosis not present

## 2015-07-28 DIAGNOSIS — D688 Other specified coagulation defects: Secondary | ICD-10-CM | POA: Diagnosis not present

## 2015-07-28 DIAGNOSIS — D509 Iron deficiency anemia, unspecified: Secondary | ICD-10-CM | POA: Diagnosis not present

## 2015-07-28 DIAGNOSIS — Z794 Long term (current) use of insulin: Secondary | ICD-10-CM | POA: Diagnosis not present

## 2015-07-28 DIAGNOSIS — Z4781 Encounter for orthopedic aftercare following surgical amputation: Secondary | ICD-10-CM | POA: Diagnosis not present

## 2015-07-28 DIAGNOSIS — Z7982 Long term (current) use of aspirin: Secondary | ICD-10-CM | POA: Diagnosis not present

## 2015-07-28 DIAGNOSIS — I120 Hypertensive chronic kidney disease with stage 5 chronic kidney disease or end stage renal disease: Secondary | ICD-10-CM | POA: Diagnosis not present

## 2015-07-28 MED ORDER — DOXYCYCLINE HYCLATE 100 MG PO TABS: 100.0000 mg | ORAL_TABLET | Freq: Two times a day (BID) | ORAL | Status: DC

## 2015-07-28 NOTE — Telephone Encounter (Signed)
Pt called to report that he was just discharged from the hospital after a BKA and the splint he was given has worn a wound in the back of his knee that is draining and foul smelling. It is not painful and he has no fever, chills or other symptoms of systemic infection. He has a f/u scheduled with Dr. Nori Riis on Wednesday.  I called in doxycycline for him to start today and made him an appointment with Dr. Bonner Puna on Monday. I have advised him that if he starts having pain, feeling ill or gets a fever he should go to the ED. Pt is agreeable.

## 2015-07-30 ENCOUNTER — Encounter: Payer: Self-pay | Admitting: Family Medicine

## 2015-07-30 ENCOUNTER — Telehealth: Payer: Self-pay | Admitting: *Deleted

## 2015-07-30 ENCOUNTER — Ambulatory Visit (INDEPENDENT_AMBULATORY_CARE_PROVIDER_SITE_OTHER): Payer: Medicare Other | Admitting: Family Medicine

## 2015-07-30 VITALS — BP 160/66 | HR 104 | Temp 98.3°F

## 2015-07-30 DIAGNOSIS — G8929 Other chronic pain: Secondary | ICD-10-CM | POA: Diagnosis not present

## 2015-07-30 DIAGNOSIS — Z4781 Encounter for orthopedic aftercare following surgical amputation: Secondary | ICD-10-CM | POA: Diagnosis not present

## 2015-07-30 DIAGNOSIS — D649 Anemia, unspecified: Secondary | ICD-10-CM | POA: Diagnosis not present

## 2015-07-30 DIAGNOSIS — Z89512 Acquired absence of left leg below knee: Secondary | ICD-10-CM

## 2015-07-30 DIAGNOSIS — Z89519 Acquired absence of unspecified leg below knee: Secondary | ICD-10-CM | POA: Insufficient documentation

## 2015-07-30 DIAGNOSIS — I120 Hypertensive chronic kidney disease with stage 5 chronic kidney disease or end stage renal disease: Secondary | ICD-10-CM | POA: Diagnosis not present

## 2015-07-30 DIAGNOSIS — E1151 Type 2 diabetes mellitus with diabetic peripheral angiopathy without gangrene: Secondary | ICD-10-CM | POA: Diagnosis not present

## 2015-07-30 DIAGNOSIS — J449 Chronic obstructive pulmonary disease, unspecified: Secondary | ICD-10-CM | POA: Diagnosis not present

## 2015-07-30 DIAGNOSIS — E1140 Type 2 diabetes mellitus with diabetic neuropathy, unspecified: Secondary | ICD-10-CM | POA: Diagnosis not present

## 2015-07-30 DIAGNOSIS — S80212A Abrasion, left knee, initial encounter: Secondary | ICD-10-CM

## 2015-07-30 DIAGNOSIS — Z794 Long term (current) use of insulin: Secondary | ICD-10-CM | POA: Diagnosis not present

## 2015-07-30 DIAGNOSIS — N186 End stage renal disease: Secondary | ICD-10-CM | POA: Diagnosis not present

## 2015-07-30 DIAGNOSIS — Z7982 Long term (current) use of aspirin: Secondary | ICD-10-CM | POA: Diagnosis not present

## 2015-07-30 DIAGNOSIS — Z992 Dependence on renal dialysis: Secondary | ICD-10-CM | POA: Diagnosis not present

## 2015-07-30 DIAGNOSIS — E785 Hyperlipidemia, unspecified: Secondary | ICD-10-CM | POA: Diagnosis not present

## 2015-07-30 DIAGNOSIS — E1122 Type 2 diabetes mellitus with diabetic chronic kidney disease: Secondary | ICD-10-CM | POA: Diagnosis not present

## 2015-07-30 NOTE — Telephone Encounter (Signed)
[facility] nurse Wyatt Haste informed of below. Katharina Caper, April D, [place]

## 2015-07-30 NOTE — Progress Notes (Signed)
Subjective: John [name] is a 60 y.o. male patient of Dr. Verlon Au presenting for worsening wound on back of left knee.   He reports worsening abrasion over the past few days from pressure on wheelchair on the back of his leg. This has been dressed by HH-RN who reported it appeared red and malodorous. Calling the emergency line on 2/18, he was prescribed doxycycline for possible infection based on the above symptoms. He has taken 2 doses and tolerated these well.   - ROS: No N/V/D, fever, chills.  - Smoker, recent L BKA.  Objective: BP 160/66 mmHg  Pulse 104  Temp(Src) 98.3 F (36.8 C) (Oral)  Wt  Gen: Pleasant, chronically ill-appearing, nontoxic 60 y.o. male in no distress Skin: Irregular denuded area of left popliteal fossa ~ 7 cm x 7 cm. Minimal surrounding erythema, no drainage or streaking. Dressing replaced in office, c/d/i.   Assessment/Plan: John [name] is a 60 y.o. male here for left popliteal fossa abrasion without apparent infection.  S/P BKA (below knee amputation) unilateral (John [name]), LEFT Now with pressure abrasion on left popliteal fossa withOUT signs of infection locally or systemically.  - Wound dressed with duo derm and pressure dressing today, taking care to avoid over compression given high risk for ischemia.  - D/C doxycycline - Follow up later this week for wound reassessment with PCP

## 2015-07-30 NOTE — Telephone Encounter (Signed)
Dear John [name] Team I saw him in clinic with Dr Bonner Puna this morning--we applied a restore dressing, padding, curlex--we are going to see him back Wednesday and I will dress it again then. It should NOT need changing tomorrow. She should call Wed afternoon and get my orders for dressing changes THANKS! [name]

## 2015-07-30 NOTE — Patient Instructions (Signed)
Thank you for coming in today!  - STOP taking the antibiotic (doxycycline) because I don't believe the area is infected.  - We re-wrapped the area with a good covering. Try to keep this dry, and apply vaseline over the front of your leg to keep a good barrier on your skin. If the dressing gets wet, you should use the dressing we gave you to replace the wet one with.  - Dr. Nori Riis will see you on Wednesday.   Our clinic's number is [phone]. Feel free to call any time with questions or concerns. We will answer any questions after hours with our 24-hour emergency line at that number as well.   - Dr. Bonner Puna

## 2015-07-30 NOTE — Telephone Encounter (Signed)
[facility] nurse calling to report that she went out to check patient left below the knee amputation and that the site has a very bad odor with yellow drainage and looks infected. Nurse reports that she applied a silver dressing to site and needs verbal orders to continue dressing site.

## 2015-07-31 DIAGNOSIS — R52 Pain, unspecified: Secondary | ICD-10-CM | POA: Diagnosis not present

## 2015-07-31 DIAGNOSIS — D688 Other specified coagulation defects: Secondary | ICD-10-CM | POA: Diagnosis not present

## 2015-07-31 DIAGNOSIS — D689 Coagulation defect, unspecified: Secondary | ICD-10-CM | POA: Diagnosis not present

## 2015-07-31 DIAGNOSIS — N2581 Secondary hyperparathyroidism of renal origin: Secondary | ICD-10-CM | POA: Diagnosis not present

## 2015-07-31 DIAGNOSIS — D509 Iron deficiency anemia, unspecified: Secondary | ICD-10-CM | POA: Diagnosis not present

## 2015-07-31 DIAGNOSIS — E119 Type 2 diabetes mellitus without complications: Secondary | ICD-10-CM | POA: Diagnosis not present

## 2015-07-31 DIAGNOSIS — N186 End stage renal disease: Secondary | ICD-10-CM | POA: Diagnosis not present

## 2015-07-31 DIAGNOSIS — D631 Anemia in chronic kidney disease: Secondary | ICD-10-CM | POA: Diagnosis not present

## 2015-07-31 DIAGNOSIS — R197 Diarrhea, unspecified: Secondary | ICD-10-CM | POA: Diagnosis not present

## 2015-07-31 NOTE — Assessment & Plan Note (Signed)
Now with pressure abrasion on left popliteal fossa withOUT signs of infection locally or systemically.  - Wound dressed with duo derm and pressure dressing today, taking care to avoid over compression given high risk for ischemia.  - D/C doxycycline - Follow up later this week for wound reassessment with PCP

## 2015-07-31 NOTE — Telephone Encounter (Signed)
John [name] with [facility] calling regarding clarifcation on medications.  Can call her back at [phone].

## 2015-08-01 ENCOUNTER — Ambulatory Visit: Payer: Medicare Other | Admitting: Family Medicine

## 2015-08-01 ENCOUNTER — Encounter: Payer: Self-pay | Admitting: Family Medicine

## 2015-08-01 NOTE — Telephone Encounter (Signed)
Spoke w John [name] Reviewed meds---he is out of plavix--I am not sure exactly why he was on that. i will review chart and call her back if we need to restart. I gave orders for Restore dressing to back of BKA 3x / week  [name]

## 2015-08-01 NOTE — Progress Notes (Signed)
Patient ID: John [name], male   DOB: 20-Feb-1956, 60 y.o.   MRN: [id number] Re his plavix--ws for his post arteriogram care per note ".Marland KitchenMarland KitchenPatient presented on 7/25 as an outpatient for elective peripheral angiogram due to PAD. He underwent successful PTA for 80% left CI to < 20%, successful PTA with 80% left external iliac to <20%, successful PTA and stenting of L CFA 100% to < 20%, successful PTA and stenting L SFA 100% to 20%. He was loaded on plavix and will continue for at least 3 months. He will follow up with Dr Andree Elk.

## 2015-08-02 DIAGNOSIS — G8929 Other chronic pain: Secondary | ICD-10-CM | POA: Diagnosis not present

## 2015-08-02 DIAGNOSIS — Z4781 Encounter for orthopedic aftercare following surgical amputation: Secondary | ICD-10-CM | POA: Diagnosis not present

## 2015-08-02 DIAGNOSIS — N2581 Secondary hyperparathyroidism of renal origin: Secondary | ICD-10-CM | POA: Diagnosis not present

## 2015-08-02 DIAGNOSIS — Z7982 Long term (current) use of aspirin: Secondary | ICD-10-CM | POA: Diagnosis not present

## 2015-08-02 DIAGNOSIS — E1151 Type 2 diabetes mellitus with diabetic peripheral angiopathy without gangrene: Secondary | ICD-10-CM | POA: Diagnosis not present

## 2015-08-02 DIAGNOSIS — N186 End stage renal disease: Secondary | ICD-10-CM | POA: Diagnosis not present

## 2015-08-02 DIAGNOSIS — R52 Pain, unspecified: Secondary | ICD-10-CM | POA: Diagnosis not present

## 2015-08-02 DIAGNOSIS — E1122 Type 2 diabetes mellitus with diabetic chronic kidney disease: Secondary | ICD-10-CM | POA: Diagnosis not present

## 2015-08-02 DIAGNOSIS — I120 Hypertensive chronic kidney disease with stage 5 chronic kidney disease or end stage renal disease: Secondary | ICD-10-CM | POA: Diagnosis not present

## 2015-08-02 DIAGNOSIS — Z992 Dependence on renal dialysis: Secondary | ICD-10-CM | POA: Diagnosis not present

## 2015-08-02 DIAGNOSIS — D649 Anemia, unspecified: Secondary | ICD-10-CM | POA: Diagnosis not present

## 2015-08-02 DIAGNOSIS — J449 Chronic obstructive pulmonary disease, unspecified: Secondary | ICD-10-CM | POA: Diagnosis not present

## 2015-08-02 DIAGNOSIS — D688 Other specified coagulation defects: Secondary | ICD-10-CM | POA: Diagnosis not present

## 2015-08-02 DIAGNOSIS — R197 Diarrhea, unspecified: Secondary | ICD-10-CM | POA: Diagnosis not present

## 2015-08-02 DIAGNOSIS — Z89512 Acquired absence of left leg below knee: Secondary | ICD-10-CM | POA: Diagnosis not present

## 2015-08-02 DIAGNOSIS — D689 Coagulation defect, unspecified: Secondary | ICD-10-CM | POA: Diagnosis not present

## 2015-08-02 DIAGNOSIS — Z794 Long term (current) use of insulin: Secondary | ICD-10-CM | POA: Diagnosis not present

## 2015-08-02 DIAGNOSIS — E785 Hyperlipidemia, unspecified: Secondary | ICD-10-CM | POA: Diagnosis not present

## 2015-08-02 DIAGNOSIS — D509 Iron deficiency anemia, unspecified: Secondary | ICD-10-CM | POA: Diagnosis not present

## 2015-08-02 DIAGNOSIS — E1140 Type 2 diabetes mellitus with diabetic neuropathy, unspecified: Secondary | ICD-10-CM | POA: Diagnosis not present

## 2015-08-02 DIAGNOSIS — E119 Type 2 diabetes mellitus without complications: Secondary | ICD-10-CM | POA: Diagnosis not present

## 2015-08-02 DIAGNOSIS — D631 Anemia in chronic kidney disease: Secondary | ICD-10-CM | POA: Diagnosis not present

## 2015-08-03 DIAGNOSIS — T82898A Other specified complication of vascular prosthetic devices, implants and grafts, initial encounter: Secondary | ICD-10-CM | POA: Diagnosis not present

## 2015-08-03 DIAGNOSIS — Z4781 Encounter for orthopedic aftercare following surgical amputation: Secondary | ICD-10-CM | POA: Diagnosis not present

## 2015-08-03 DIAGNOSIS — Z7982 Long term (current) use of aspirin: Secondary | ICD-10-CM | POA: Diagnosis not present

## 2015-08-03 DIAGNOSIS — Z992 Dependence on renal dialysis: Secondary | ICD-10-CM | POA: Diagnosis not present

## 2015-08-03 DIAGNOSIS — N186 End stage renal disease: Secondary | ICD-10-CM | POA: Diagnosis not present

## 2015-08-03 DIAGNOSIS — E785 Hyperlipidemia, unspecified: Secondary | ICD-10-CM | POA: Diagnosis not present

## 2015-08-03 DIAGNOSIS — Z89512 Acquired absence of left leg below knee: Secondary | ICD-10-CM | POA: Diagnosis not present

## 2015-08-03 DIAGNOSIS — J449 Chronic obstructive pulmonary disease, unspecified: Secondary | ICD-10-CM | POA: Diagnosis not present

## 2015-08-03 DIAGNOSIS — G5621 Lesion of ulnar nerve, right upper limb: Secondary | ICD-10-CM | POA: Diagnosis not present

## 2015-08-03 DIAGNOSIS — E1122 Type 2 diabetes mellitus with diabetic chronic kidney disease: Secondary | ICD-10-CM | POA: Diagnosis not present

## 2015-08-03 DIAGNOSIS — I120 Hypertensive chronic kidney disease with stage 5 chronic kidney disease or end stage renal disease: Secondary | ICD-10-CM | POA: Diagnosis not present

## 2015-08-03 DIAGNOSIS — G5601 Carpal tunnel syndrome, right upper limb: Secondary | ICD-10-CM | POA: Diagnosis not present

## 2015-08-03 DIAGNOSIS — G8929 Other chronic pain: Secondary | ICD-10-CM | POA: Diagnosis not present

## 2015-08-03 DIAGNOSIS — D649 Anemia, unspecified: Secondary | ICD-10-CM | POA: Diagnosis not present

## 2015-08-03 DIAGNOSIS — E1140 Type 2 diabetes mellitus with diabetic neuropathy, unspecified: Secondary | ICD-10-CM | POA: Diagnosis not present

## 2015-08-03 DIAGNOSIS — Z794 Long term (current) use of insulin: Secondary | ICD-10-CM | POA: Diagnosis not present

## 2015-08-03 DIAGNOSIS — G5602 Carpal tunnel syndrome, left upper limb: Secondary | ICD-10-CM | POA: Diagnosis not present

## 2015-08-03 DIAGNOSIS — E1151 Type 2 diabetes mellitus with diabetic peripheral angiopathy without gangrene: Secondary | ICD-10-CM | POA: Diagnosis not present

## 2015-08-03 DIAGNOSIS — G5622 Lesion of ulnar nerve, left upper limb: Secondary | ICD-10-CM | POA: Diagnosis not present

## 2015-08-04 DIAGNOSIS — D689 Coagulation defect, unspecified: Secondary | ICD-10-CM | POA: Diagnosis not present

## 2015-08-04 DIAGNOSIS — R197 Diarrhea, unspecified: Secondary | ICD-10-CM | POA: Diagnosis not present

## 2015-08-04 DIAGNOSIS — N186 End stage renal disease: Secondary | ICD-10-CM | POA: Diagnosis not present

## 2015-08-04 DIAGNOSIS — N2581 Secondary hyperparathyroidism of renal origin: Secondary | ICD-10-CM | POA: Diagnosis not present

## 2015-08-04 DIAGNOSIS — R52 Pain, unspecified: Secondary | ICD-10-CM | POA: Diagnosis not present

## 2015-08-04 DIAGNOSIS — D631 Anemia in chronic kidney disease: Secondary | ICD-10-CM | POA: Diagnosis not present

## 2015-08-04 DIAGNOSIS — D688 Other specified coagulation defects: Secondary | ICD-10-CM | POA: Diagnosis not present

## 2015-08-04 DIAGNOSIS — E119 Type 2 diabetes mellitus without complications: Secondary | ICD-10-CM | POA: Diagnosis not present

## 2015-08-04 DIAGNOSIS — D509 Iron deficiency anemia, unspecified: Secondary | ICD-10-CM | POA: Diagnosis not present

## 2015-08-06 DIAGNOSIS — Z992 Dependence on renal dialysis: Secondary | ICD-10-CM | POA: Diagnosis not present

## 2015-08-06 DIAGNOSIS — E1122 Type 2 diabetes mellitus with diabetic chronic kidney disease: Secondary | ICD-10-CM | POA: Diagnosis not present

## 2015-08-06 DIAGNOSIS — D649 Anemia, unspecified: Secondary | ICD-10-CM | POA: Diagnosis not present

## 2015-08-06 DIAGNOSIS — J449 Chronic obstructive pulmonary disease, unspecified: Secondary | ICD-10-CM | POA: Diagnosis not present

## 2015-08-06 DIAGNOSIS — G8929 Other chronic pain: Secondary | ICD-10-CM | POA: Diagnosis not present

## 2015-08-06 DIAGNOSIS — Z4781 Encounter for orthopedic aftercare following surgical amputation: Secondary | ICD-10-CM | POA: Diagnosis not present

## 2015-08-06 DIAGNOSIS — Z794 Long term (current) use of insulin: Secondary | ICD-10-CM | POA: Diagnosis not present

## 2015-08-06 DIAGNOSIS — Z89512 Acquired absence of left leg below knee: Secondary | ICD-10-CM | POA: Diagnosis not present

## 2015-08-06 DIAGNOSIS — E785 Hyperlipidemia, unspecified: Secondary | ICD-10-CM | POA: Diagnosis not present

## 2015-08-06 DIAGNOSIS — I120 Hypertensive chronic kidney disease with stage 5 chronic kidney disease or end stage renal disease: Secondary | ICD-10-CM | POA: Diagnosis not present

## 2015-08-06 DIAGNOSIS — N186 End stage renal disease: Secondary | ICD-10-CM | POA: Diagnosis not present

## 2015-08-06 DIAGNOSIS — E1151 Type 2 diabetes mellitus with diabetic peripheral angiopathy without gangrene: Secondary | ICD-10-CM | POA: Diagnosis not present

## 2015-08-06 DIAGNOSIS — E1140 Type 2 diabetes mellitus with diabetic neuropathy, unspecified: Secondary | ICD-10-CM | POA: Diagnosis not present

## 2015-08-06 DIAGNOSIS — Z7982 Long term (current) use of aspirin: Secondary | ICD-10-CM | POA: Diagnosis not present

## 2015-08-07 ENCOUNTER — Other Ambulatory Visit: Payer: Self-pay | Admitting: *Deleted

## 2015-08-07 ENCOUNTER — Telehealth: Payer: Self-pay | Admitting: Family Medicine

## 2015-08-07 DIAGNOSIS — Z4781 Encounter for orthopedic aftercare following surgical amputation: Secondary | ICD-10-CM | POA: Diagnosis not present

## 2015-08-07 DIAGNOSIS — Z794 Long term (current) use of insulin: Secondary | ICD-10-CM | POA: Diagnosis not present

## 2015-08-07 DIAGNOSIS — Z4801 Encounter for change or removal of surgical wound dressing: Secondary | ICD-10-CM | POA: Diagnosis not present

## 2015-08-07 DIAGNOSIS — D509 Iron deficiency anemia, unspecified: Secondary | ICD-10-CM | POA: Diagnosis not present

## 2015-08-07 DIAGNOSIS — I120 Hypertensive chronic kidney disease with stage 5 chronic kidney disease or end stage renal disease: Secondary | ICD-10-CM | POA: Diagnosis not present

## 2015-08-07 DIAGNOSIS — R197 Diarrhea, unspecified: Secondary | ICD-10-CM | POA: Diagnosis not present

## 2015-08-07 DIAGNOSIS — D688 Other specified coagulation defects: Secondary | ICD-10-CM | POA: Diagnosis not present

## 2015-08-07 DIAGNOSIS — D649 Anemia, unspecified: Secondary | ICD-10-CM | POA: Diagnosis not present

## 2015-08-07 DIAGNOSIS — J449 Chronic obstructive pulmonary disease, unspecified: Secondary | ICD-10-CM | POA: Diagnosis not present

## 2015-08-07 DIAGNOSIS — D631 Anemia in chronic kidney disease: Secondary | ICD-10-CM | POA: Diagnosis not present

## 2015-08-07 DIAGNOSIS — E1151 Type 2 diabetes mellitus with diabetic peripheral angiopathy without gangrene: Secondary | ICD-10-CM | POA: Diagnosis not present

## 2015-08-07 DIAGNOSIS — S81009A Unspecified open wound, unspecified knee, initial encounter: Secondary | ICD-10-CM | POA: Diagnosis not present

## 2015-08-07 DIAGNOSIS — E785 Hyperlipidemia, unspecified: Secondary | ICD-10-CM | POA: Diagnosis not present

## 2015-08-07 DIAGNOSIS — Z992 Dependence on renal dialysis: Secondary | ICD-10-CM | POA: Diagnosis not present

## 2015-08-07 DIAGNOSIS — E1122 Type 2 diabetes mellitus with diabetic chronic kidney disease: Secondary | ICD-10-CM | POA: Diagnosis not present

## 2015-08-07 DIAGNOSIS — Z89512 Acquired absence of left leg below knee: Secondary | ICD-10-CM | POA: Diagnosis not present

## 2015-08-07 DIAGNOSIS — D689 Coagulation defect, unspecified: Secondary | ICD-10-CM | POA: Diagnosis not present

## 2015-08-07 DIAGNOSIS — R52 Pain, unspecified: Secondary | ICD-10-CM | POA: Diagnosis not present

## 2015-08-07 DIAGNOSIS — G8929 Other chronic pain: Secondary | ICD-10-CM | POA: Diagnosis not present

## 2015-08-07 DIAGNOSIS — T829XXS Unspecified complication of cardiac and vascular prosthetic device, implant and graft, sequela: Secondary | ICD-10-CM

## 2015-08-07 DIAGNOSIS — N2581 Secondary hyperparathyroidism of renal origin: Secondary | ICD-10-CM | POA: Diagnosis not present

## 2015-08-07 DIAGNOSIS — N186 End stage renal disease: Secondary | ICD-10-CM | POA: Diagnosis not present

## 2015-08-07 DIAGNOSIS — E109 Type 1 diabetes mellitus without complications: Secondary | ICD-10-CM | POA: Diagnosis not present

## 2015-08-07 DIAGNOSIS — E119 Type 2 diabetes mellitus without complications: Secondary | ICD-10-CM | POA: Diagnosis not present

## 2015-08-07 DIAGNOSIS — Z7982 Long term (current) use of aspirin: Secondary | ICD-10-CM | POA: Diagnosis not present

## 2015-08-07 DIAGNOSIS — I739 Peripheral vascular disease, unspecified: Secondary | ICD-10-CM | POA: Diagnosis not present

## 2015-08-07 DIAGNOSIS — E1140 Type 2 diabetes mellitus with diabetic neuropathy, unspecified: Secondary | ICD-10-CM | POA: Diagnosis not present

## 2015-08-07 DIAGNOSIS — E1129 Type 2 diabetes mellitus with other diabetic kidney complication: Secondary | ICD-10-CM | POA: Diagnosis not present

## 2015-08-07 NOTE — Telephone Encounter (Signed)
Need to resend form in with the last date [facility] was seen by provider.  [employer] cannot complete order for a CNA until that information is provided.  They are requesting a completed form with info.  Can fax to [phone]

## 2015-08-08 ENCOUNTER — Encounter: Payer: Self-pay | Admitting: Physical Medicine & Rehabilitation

## 2015-08-08 ENCOUNTER — Encounter: Payer: Medicare Other | Attending: Physical Medicine & Rehabilitation | Admitting: Physical Medicine & Rehabilitation

## 2015-08-08 VITALS — BP 129/54 | HR 87

## 2015-08-08 DIAGNOSIS — Z8673 Personal history of transient ischemic attack (TIA), and cerebral infarction without residual deficits: Secondary | ICD-10-CM | POA: Diagnosis not present

## 2015-08-08 DIAGNOSIS — M79642 Pain in left hand: Secondary | ICD-10-CM

## 2015-08-08 DIAGNOSIS — E1165 Type 2 diabetes mellitus with hyperglycemia: Secondary | ICD-10-CM | POA: Insufficient documentation

## 2015-08-08 DIAGNOSIS — Z794 Long term (current) use of insulin: Secondary | ICD-10-CM | POA: Diagnosis not present

## 2015-08-08 DIAGNOSIS — N186 End stage renal disease: Secondary | ICD-10-CM | POA: Diagnosis not present

## 2015-08-08 DIAGNOSIS — I120 Hypertensive chronic kidney disease with stage 5 chronic kidney disease or end stage renal disease: Secondary | ICD-10-CM | POA: Diagnosis not present

## 2015-08-08 DIAGNOSIS — Z992 Dependence on renal dialysis: Secondary | ICD-10-CM | POA: Diagnosis not present

## 2015-08-08 DIAGNOSIS — G8929 Other chronic pain: Secondary | ICD-10-CM | POA: Insufficient documentation

## 2015-08-08 DIAGNOSIS — I739 Peripheral vascular disease, unspecified: Secondary | ICD-10-CM | POA: Diagnosis not present

## 2015-08-08 DIAGNOSIS — S81802A Unspecified open wound, left lower leg, initial encounter: Secondary | ICD-10-CM

## 2015-08-08 DIAGNOSIS — J45909 Unspecified asthma, uncomplicated: Secondary | ICD-10-CM | POA: Diagnosis not present

## 2015-08-08 DIAGNOSIS — Z89512 Acquired absence of left leg below knee: Secondary | ICD-10-CM

## 2015-08-08 DIAGNOSIS — Z4781 Encounter for orthopedic aftercare following surgical amputation: Secondary | ICD-10-CM | POA: Diagnosis not present

## 2015-08-08 DIAGNOSIS — R269 Unspecified abnormalities of gait and mobility: Secondary | ICD-10-CM | POA: Diagnosis not present

## 2015-08-08 DIAGNOSIS — J449 Chronic obstructive pulmonary disease, unspecified: Secondary | ICD-10-CM | POA: Insufficient documentation

## 2015-08-08 DIAGNOSIS — E785 Hyperlipidemia, unspecified: Secondary | ICD-10-CM | POA: Insufficient documentation

## 2015-08-08 DIAGNOSIS — E1140 Type 2 diabetes mellitus with diabetic neuropathy, unspecified: Secondary | ICD-10-CM | POA: Diagnosis not present

## 2015-08-08 DIAGNOSIS — Z7982 Long term (current) use of aspirin: Secondary | ICD-10-CM | POA: Diagnosis not present

## 2015-08-08 DIAGNOSIS — E1142 Type 2 diabetes mellitus with diabetic polyneuropathy: Secondary | ICD-10-CM | POA: Diagnosis not present

## 2015-08-08 DIAGNOSIS — M25562 Pain in left knee: Secondary | ICD-10-CM | POA: Diagnosis not present

## 2015-08-08 DIAGNOSIS — E1151 Type 2 diabetes mellitus with diabetic peripheral angiopathy without gangrene: Secondary | ICD-10-CM | POA: Diagnosis not present

## 2015-08-08 DIAGNOSIS — F17210 Nicotine dependence, cigarettes, uncomplicated: Secondary | ICD-10-CM | POA: Insufficient documentation

## 2015-08-08 DIAGNOSIS — D649 Anemia, unspecified: Secondary | ICD-10-CM | POA: Diagnosis not present

## 2015-08-08 DIAGNOSIS — E1122 Type 2 diabetes mellitus with diabetic chronic kidney disease: Secondary | ICD-10-CM | POA: Insufficient documentation

## 2015-08-08 MED ORDER — COLLAGENASE 250 UNIT/GM EX OINT: 1.0000 "application " | TOPICAL_OINTMENT | Freq: Every day | CUTANEOUS | Status: AC

## 2015-08-08 NOTE — Telephone Encounter (Signed)
John [name] with [facility]:  Pt was suppose to schedule appt with dr Nori Riis.  Pts wound to his leg-Tracy would like to put some Santyl on it but needs Rx from Dr Nori Riis and called into pharmacy.  [facility]  [phone]. John [name] will cover wound with wet gauze and change it daily

## 2015-08-08 NOTE — Telephone Encounter (Signed)
Lisa from [facility] called to say she is refaxing form for provider to complete and fax back to have Mr. Yake receive a tub seat.  Pt already have seat but need form completed so ins will cover.  Can fax to [phone] Revonda Humphrey.

## 2015-08-08 NOTE — Progress Notes (Signed)
Subjective:    Patient ID: John [name], male    DOB: Nov 05, 1955, 60 y.o.   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  Pt was discharged from the hospital 2/14.  He was encouraged to be complaint with his DM meds.  Pt notes that the brace he was wearing caused an abrasion on the back of his knee.  He was given abx. He continues to receive HH.  He also complaints of discomfort on his anterior knee and notes that the pain is excruciating in his hand during dialysis.   Receives pain meds from Dr [name]  Pain Inventory Average Pain 7 Pain Right Now 7 My pain is intermittent, sharp and stabbing  In the last 24 hours, has pain interfered with the following? General activity 7 Relation with others 7 Enjoyment of life 7 What TIME of day is your pain at its worst? varies Sleep (in general) NA  Pain is worse with: some activites Pain improves with: medication Relief from Meds: 6  Mobility ability to climb steps?  no do you drive?  no use a wheelchair  Function not employed: date last employed . disabled: date disabled . I need assistance with the following:  meal prep, household duties and shopping  Neuro/Psych numbness spasms  Prior Studies Any changes since last visit?  no  Physicians involved in your care Any changes since last visit?  no   Family History  Problem Relation Age of Onset  . Diabetes Mother   . Heart disease Mother   . Hypertension Mother   . Heart attack Mother   . Diabetes Father     Amputation  . Heart disease Father     Before age 32  . Hypertension Father   . Heart attack Father   . Other Father     bleeding problems  . Diabetes Brother   . Heart disease Brother     Before age 51  . Hypertension Brother   . Heart attack Brother    Social History   Social  History  . Marital Status: Married    Spouse Name: [name]  . Number of Children: 4  . Years of Education: 11   Occupational History  . 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 Smoking status: Current Every Day Smoker -- 0.25 packs/day for 40 years    Types: Cigarettes    Last Attempt to Quit: 01/30/2014  . Smokeless tobacco: Never Used     Comment: 2-3 cigarettes a day  . Alcohol Use: No     Comment: Previous drinker - However, quit in 2006  . Drug Use: No  . Sexual Activity: No   Other Topics Concern  . None   Social History Narrative   Health Care POA:    Emergency Contact: wife, Dagan Terracina [phone]   End of Life Plan: gave patient AD pamphlet.   Who lives with you: wife   Any pets: 1 dog   Diet: Pt has a varied diet of protein, starch and vegetables.   Exercise: Pt reports walking around yard.    Seatbelts: Pt reports wearing seatbelt when in vehicles.    Hobbies: fishing               Past Surgical History  Procedure Laterality Date  .  Neck surgery    . Eye surgery    . Cardiac catheterization    . Endarterectomy  12/18/2011    Procedure: ENDARTERECTOMY CAROTID;  Surgeon: Angelia Mould, MD;  Location: [place];  Service: Vascular;  Laterality: Left;  . Carotid endarterectomy    . Axillary-femoral bypass graft  06/18/2012    Procedure: BYPASS GRAFT AXILLA-BIFEMORAL;  Surgeon: Angelia Mould, MD;  Location: [place];  Service: Vascular;  Laterality: Right;  Right Axillo to Right Femoral Bypass Graft  . Femoral-popliteal bypass graft  06/18/2012    Procedure: BYPASS GRAFT FEMORAL-POPLITEAL ARTERY;  Surgeon: Angelia Mould, MD;  [facility];  Service: Vascular;  Laterality: Right;  Right Femoral to Above Knee Popliteal Bypass Graft  . Amputation  06/18/2012    Procedure: AMPUTATION DIGIT;  Surgeon: Angelia Mould, MD;  Location: [facility] OR;  Service: Vascular;  Laterality: Right;  Right side, amputation of fourth and fifth toes  . Av  fistula placement  06/24/2012    Procedure: ARTERIOVENOUS (AV) FISTULA CREATION;  Surgeon: Angelia Mould, MD;  Location: [place];  Service: Vascular;  Laterality: Left;  . Lower extremity angiogram Right 11/21/2011    Procedure: LOWER EXTREMITY ANGIOGRAM;  Surgeon: Elam Dutch, MD;  Location: [facility] CATH LAB;  Service: Cardiovascular;  Laterality: Right;  . Enucleation Left     trauma as a child. Wears prosthesis.    Past Medical History  Diagnosis Date  . COPD (chronic obstructive pulmonary disease) ([place])   . Chronic back pain     Chronic narcotics for this for many years  . Diabetes mellitus   . Hyperlipidemia   . Hypertension   . Foot pain   . Peripheral vascular disease ([place])   . Asthma   . Stroke ([place])     No residual "Mini strokes"  . Ulcer 1985  . Fracture of foot     Compond- Right  . Neuromuscular disorder ([place])     periferal neuropathy  . Chronic kidney disease     CKD stage III   BP 129/54 mmHg  Pulse 87  SpO2 98%  Opioid Risk Score:   Fall Risk Score:  `1  Depression screen PHQ 2/9  Depression screen [facility] 2/9 08/08/2015 07/30/2015 06/13/2015 12/06/2014 10/18/2014 07/19/2014 07/03/2014  Decreased Interest 0 0 0 0 0 0 0  Down, Depressed, Hopeless 0 0 0 0 0 0 0  PHQ - 2 Score 0 0 0 0 0 0 0  Altered sleeping 0 - - - - - -  Tired, decreased energy 1 - - - - - -  Change in appetite 1 - - - - - -  Feeling bad or failure about yourself  0 - - - - - -  Trouble concentrating 0 - - - - - -  Moving slowly or fidgety/restless 0 - - - - - -  Suicidal thoughts 0 - - - - - -  PHQ-9 Score 2 - - - - - -    Review of Systems  Constitutional:       Poor appetite  Musculoskeletal: Positive for gait problem.       Left AKA pain  All other systems reviewed and are negative.     Objective:   Physical Exam Constitutional: He appears well-developed and well-nourished. NAD HENT: Atraumatic. Normocephalic. Mouth/Throat: Poor dentition  Eyes: Lids are normal. Left eye  exhibits abnormal extraocular motion. 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  Cardiovascular: Normal rate and regular rhythm. Murmur  heard. Respiratory: Effort normal and breath sounds normal. No respiratory distress. He exhibits no tenderness.  GI: Soft. Bowel sounds are normal. He exhibits no distension. There is no tenderness. There is no guarding.  Musculoskeletal: He exhibits no tenderness. LBKA without edema without sutures. Able to extend leg more up to about 10 deg.  Fingers b/l atrophic and ischemic appearing on left.  Left knee with DTI on patella, now open.  Knee with arthritic changes and tight hamstrings.  Neurological: He is alert and oriented. Right exopthalmic.  He was able to follow simple motor commands without difficulty.  Motor: RUE: 5/5 LUE 4+/5 LLE: hip flexion 4/5 RLE: 4+/5 proximal to distal  Skin: Skin is warm and dry. He is not diaphoretic.  Open wound on left patella Open wound popliteal fossa Psychiatric: His speech is normal. Mood/affect normal.     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  Left BKA with pain and wound  Cont Therapies  Avoid pressure to anterior and posterior knee, avoid bracing and excessive compressoin  Will refer pt to wound care  Will order santly for popliteal wound  2. Abnormality of gait  Cont therapies  Cont wheelchair  3. Ischemic changes left hand  Pt states he has an appointment to discuss possibility of fistula reversal

## 2015-08-09 ENCOUNTER — Encounter: Payer: Self-pay | Admitting: Family Medicine

## 2015-08-09 ENCOUNTER — Ambulatory Visit (INDEPENDENT_AMBULATORY_CARE_PROVIDER_SITE_OTHER): Payer: Medicare Other | Admitting: Family Medicine

## 2015-08-09 VITALS — BP 98/75 | HR 89 | Temp 98.1°F

## 2015-08-09 DIAGNOSIS — Z7982 Long term (current) use of aspirin: Secondary | ICD-10-CM | POA: Diagnosis not present

## 2015-08-09 DIAGNOSIS — I120 Hypertensive chronic kidney disease with stage 5 chronic kidney disease or end stage renal disease: Secondary | ICD-10-CM | POA: Diagnosis not present

## 2015-08-09 DIAGNOSIS — D689 Coagulation defect, unspecified: Secondary | ICD-10-CM | POA: Diagnosis not present

## 2015-08-09 DIAGNOSIS — E785 Hyperlipidemia, unspecified: Secondary | ICD-10-CM | POA: Diagnosis not present

## 2015-08-09 DIAGNOSIS — Z89512 Acquired absence of left leg below knee: Secondary | ICD-10-CM | POA: Diagnosis not present

## 2015-08-09 DIAGNOSIS — E119 Type 2 diabetes mellitus without complications: Secondary | ICD-10-CM | POA: Diagnosis not present

## 2015-08-09 DIAGNOSIS — E1122 Type 2 diabetes mellitus with diabetic chronic kidney disease: Secondary | ICD-10-CM | POA: Diagnosis not present

## 2015-08-09 DIAGNOSIS — G8929 Other chronic pain: Secondary | ICD-10-CM | POA: Diagnosis not present

## 2015-08-09 DIAGNOSIS — Z992 Dependence on renal dialysis: Secondary | ICD-10-CM | POA: Diagnosis not present

## 2015-08-09 DIAGNOSIS — D631 Anemia in chronic kidney disease: Secondary | ICD-10-CM | POA: Diagnosis not present

## 2015-08-09 DIAGNOSIS — N2581 Secondary hyperparathyroidism of renal origin: Secondary | ICD-10-CM | POA: Diagnosis not present

## 2015-08-09 DIAGNOSIS — L97921 Non-pressure chronic ulcer of unspecified part of left lower leg limited to breakdown of skin: Secondary | ICD-10-CM

## 2015-08-09 DIAGNOSIS — Z794 Long term (current) use of insulin: Secondary | ICD-10-CM | POA: Diagnosis not present

## 2015-08-09 DIAGNOSIS — R51 Headache: Secondary | ICD-10-CM | POA: Diagnosis not present

## 2015-08-09 DIAGNOSIS — D509 Iron deficiency anemia, unspecified: Secondary | ICD-10-CM | POA: Diagnosis not present

## 2015-08-09 DIAGNOSIS — D688 Other specified coagulation defects: Secondary | ICD-10-CM | POA: Diagnosis not present

## 2015-08-09 DIAGNOSIS — L97909 Non-pressure chronic ulcer of unspecified part of unspecified lower leg with unspecified severity: Secondary | ICD-10-CM | POA: Insufficient documentation

## 2015-08-09 DIAGNOSIS — R52 Pain, unspecified: Secondary | ICD-10-CM | POA: Diagnosis not present

## 2015-08-09 DIAGNOSIS — N186 End stage renal disease: Secondary | ICD-10-CM | POA: Diagnosis not present

## 2015-08-09 DIAGNOSIS — J449 Chronic obstructive pulmonary disease, unspecified: Secondary | ICD-10-CM | POA: Diagnosis not present

## 2015-08-09 DIAGNOSIS — R197 Diarrhea, unspecified: Secondary | ICD-10-CM | POA: Diagnosis not present

## 2015-08-09 DIAGNOSIS — E1151 Type 2 diabetes mellitus with diabetic peripheral angiopathy without gangrene: Secondary | ICD-10-CM | POA: Diagnosis not present

## 2015-08-09 DIAGNOSIS — T8249XA Other complication of vascular dialysis catheter, initial encounter: Secondary | ICD-10-CM | POA: Diagnosis not present

## 2015-08-09 DIAGNOSIS — D649 Anemia, unspecified: Secondary | ICD-10-CM | POA: Diagnosis not present

## 2015-08-09 DIAGNOSIS — E1140 Type 2 diabetes mellitus with diabetic neuropathy, unspecified: Secondary | ICD-10-CM | POA: Diagnosis not present

## 2015-08-09 DIAGNOSIS — Z4781 Encounter for orthopedic aftercare following surgical amputation: Secondary | ICD-10-CM | POA: Diagnosis not present

## 2015-08-09 MED ORDER — COLLAGENASE 250 UNIT/GM EX OINT: 1.0000 "application " | TOPICAL_OINTMENT | Freq: Every day | CUTANEOUS | Status: AC

## 2015-08-09 NOTE — Telephone Encounter (Signed)
RN team OK I have called in the Santyl. Please  Give her order ot do dressing changes . [name]   I am also REFAXING the order for a CNA to [facility] care with his most recent date of service on it. I will look for the form for a tub seat [name]

## 2015-08-09 NOTE — Assessment & Plan Note (Signed)
Posterior left knee superficial pressure ulcer related to prosthesis use.  No current signs of infection.  He continues to smoke working on cessation.  - Wet-to-dry dressing, applying clinic - Continue to be followed by wound care nurse at home 3 days a week.  - He reports being referred to wound care clinic is currently waiting for an appointment.  He will call if he hasn't heard from them in the next week.  - Follow-up with PCP as scheduled

## 2015-08-09 NOTE — Telephone Encounter (Signed)
Contacted Tracy with [facility] and gave her the below information as well as the verbal to do the dressing changes. Katharina Caper, Ella Golomb D, [place]

## 2015-08-09 NOTE — Patient Instructions (Signed)
Your leg wound is not currently infected. Continue to apply cream as recommended by Dr Nori Riis.  - keep the area clean and covered - Continue working with wound care nurse, and let us know if you have not been scheduled at wound care clinic in the next 1-2 weeks - Keep your appointment with Dr Nori Riis for re-check of your wound - Call / Return to clinic if you develop fevers, chills or redness, warmth, worsening pain at your wound - Do not start wearing leg brace until advised by physician

## 2015-08-09 NOTE — Progress Notes (Signed)
   Subjective:    Patient ID: John [name], male    DOB: Nov 23, 1955, 60 y.o.   MRN: [id number]  Seen for Same day visit for   CC: leg wound  Comes in today for evaluation of wound on his left posterior leg.  He reports ulcer wound has been present for several weeks now.  He gradually been getting worse since his right below knee amputation, associated with use of his prosthesis.  He is getting hormone care 3 days week.  He denies any fevers, chills, redness, swelling or pain.  He continues to smoke  Smoking history noted.    Objective:  BP 98/75 mmHg  Pulse 89  Temp(Src) 98.1 F (36.7 C) (Oral)  General: NAD Left Knee: Superficial ulcer on posterior left knee, 2 cm x 1 cm no surrounding erythema, warmth or drainage    Assessment & Plan:   Leg ulcer ([place]) Posterior left knee superficial pressure ulcer related to prosthesis use.  No current signs of infection.  He continues to smoke working on cessation.  - Wet-to-dry dressing, applying clinic - Continue to be followed by wound care nurse at home 3 days a week.  - He reports being referred to wound care clinic is currently waiting for an appointment.  He will call if he hasn't heard from them in the next week.  - Follow-up with PCP as scheduled

## 2015-08-10 ENCOUNTER — Other Ambulatory Visit: Payer: Self-pay | Admitting: Family Medicine

## 2015-08-10 DIAGNOSIS — Z89512 Acquired absence of left leg below knee: Secondary | ICD-10-CM | POA: Diagnosis not present

## 2015-08-10 DIAGNOSIS — E1140 Type 2 diabetes mellitus with diabetic neuropathy, unspecified: Secondary | ICD-10-CM | POA: Diagnosis not present

## 2015-08-10 DIAGNOSIS — J449 Chronic obstructive pulmonary disease, unspecified: Secondary | ICD-10-CM | POA: Diagnosis not present

## 2015-08-10 DIAGNOSIS — E1151 Type 2 diabetes mellitus with diabetic peripheral angiopathy without gangrene: Secondary | ICD-10-CM | POA: Diagnosis not present

## 2015-08-10 DIAGNOSIS — Z794 Long term (current) use of insulin: Secondary | ICD-10-CM | POA: Diagnosis not present

## 2015-08-10 DIAGNOSIS — I120 Hypertensive chronic kidney disease with stage 5 chronic kidney disease or end stage renal disease: Secondary | ICD-10-CM | POA: Diagnosis not present

## 2015-08-10 DIAGNOSIS — Z7982 Long term (current) use of aspirin: Secondary | ICD-10-CM | POA: Diagnosis not present

## 2015-08-10 DIAGNOSIS — Z4781 Encounter for orthopedic aftercare following surgical amputation: Secondary | ICD-10-CM | POA: Diagnosis not present

## 2015-08-10 DIAGNOSIS — N186 End stage renal disease: Secondary | ICD-10-CM | POA: Diagnosis not present

## 2015-08-10 DIAGNOSIS — G8929 Other chronic pain: Secondary | ICD-10-CM | POA: Diagnosis not present

## 2015-08-10 DIAGNOSIS — D649 Anemia, unspecified: Secondary | ICD-10-CM | POA: Diagnosis not present

## 2015-08-10 DIAGNOSIS — E1122 Type 2 diabetes mellitus with diabetic chronic kidney disease: Secondary | ICD-10-CM | POA: Diagnosis not present

## 2015-08-10 DIAGNOSIS — E785 Hyperlipidemia, unspecified: Secondary | ICD-10-CM | POA: Diagnosis not present

## 2015-08-10 DIAGNOSIS — Z992 Dependence on renal dialysis: Secondary | ICD-10-CM | POA: Diagnosis not present

## 2015-08-11 DIAGNOSIS — R197 Diarrhea, unspecified: Secondary | ICD-10-CM | POA: Diagnosis not present

## 2015-08-11 DIAGNOSIS — R51 Headache: Secondary | ICD-10-CM | POA: Diagnosis not present

## 2015-08-11 DIAGNOSIS — D688 Other specified coagulation defects: Secondary | ICD-10-CM | POA: Diagnosis not present

## 2015-08-11 DIAGNOSIS — R52 Pain, unspecified: Secondary | ICD-10-CM | POA: Diagnosis not present

## 2015-08-11 DIAGNOSIS — D631 Anemia in chronic kidney disease: Secondary | ICD-10-CM | POA: Diagnosis not present

## 2015-08-11 DIAGNOSIS — T8249XA Other complication of vascular dialysis catheter, initial encounter: Secondary | ICD-10-CM | POA: Diagnosis not present

## 2015-08-11 DIAGNOSIS — E119 Type 2 diabetes mellitus without complications: Secondary | ICD-10-CM | POA: Diagnosis not present

## 2015-08-11 DIAGNOSIS — D509 Iron deficiency anemia, unspecified: Secondary | ICD-10-CM | POA: Diagnosis not present

## 2015-08-11 DIAGNOSIS — D689 Coagulation defect, unspecified: Secondary | ICD-10-CM | POA: Diagnosis not present

## 2015-08-11 DIAGNOSIS — N2581 Secondary hyperparathyroidism of renal origin: Secondary | ICD-10-CM | POA: Diagnosis not present

## 2015-08-11 DIAGNOSIS — N186 End stage renal disease: Secondary | ICD-10-CM | POA: Diagnosis not present

## 2015-08-13 ENCOUNTER — Other Ambulatory Visit: Payer: Self-pay | Admitting: Family Medicine

## 2015-08-13 DIAGNOSIS — Z992 Dependence on renal dialysis: Secondary | ICD-10-CM | POA: Diagnosis not present

## 2015-08-13 DIAGNOSIS — N186 End stage renal disease: Secondary | ICD-10-CM | POA: Diagnosis not present

## 2015-08-13 DIAGNOSIS — Z4781 Encounter for orthopedic aftercare following surgical amputation: Secondary | ICD-10-CM | POA: Diagnosis not present

## 2015-08-13 DIAGNOSIS — E1140 Type 2 diabetes mellitus with diabetic neuropathy, unspecified: Secondary | ICD-10-CM | POA: Diagnosis not present

## 2015-08-13 DIAGNOSIS — E1122 Type 2 diabetes mellitus with diabetic chronic kidney disease: Secondary | ICD-10-CM | POA: Diagnosis not present

## 2015-08-13 DIAGNOSIS — G8929 Other chronic pain: Secondary | ICD-10-CM | POA: Diagnosis not present

## 2015-08-13 DIAGNOSIS — E1151 Type 2 diabetes mellitus with diabetic peripheral angiopathy without gangrene: Secondary | ICD-10-CM | POA: Diagnosis not present

## 2015-08-13 DIAGNOSIS — I120 Hypertensive chronic kidney disease with stage 5 chronic kidney disease or end stage renal disease: Secondary | ICD-10-CM | POA: Diagnosis not present

## 2015-08-13 DIAGNOSIS — Z89512 Acquired absence of left leg below knee: Secondary | ICD-10-CM | POA: Diagnosis not present

## 2015-08-13 DIAGNOSIS — D649 Anemia, unspecified: Secondary | ICD-10-CM | POA: Diagnosis not present

## 2015-08-13 DIAGNOSIS — J449 Chronic obstructive pulmonary disease, unspecified: Secondary | ICD-10-CM | POA: Diagnosis not present

## 2015-08-13 DIAGNOSIS — Z794 Long term (current) use of insulin: Secondary | ICD-10-CM | POA: Diagnosis not present

## 2015-08-13 DIAGNOSIS — E785 Hyperlipidemia, unspecified: Secondary | ICD-10-CM | POA: Diagnosis not present

## 2015-08-13 DIAGNOSIS — Z7982 Long term (current) use of aspirin: Secondary | ICD-10-CM | POA: Diagnosis not present

## 2015-08-13 NOTE — Telephone Encounter (Signed)
Pt called because he needs a refill on his pain medication left up front for pick up. Please let patient know when ready. jw

## 2015-08-14 DIAGNOSIS — R52 Pain, unspecified: Secondary | ICD-10-CM | POA: Diagnosis not present

## 2015-08-14 DIAGNOSIS — D631 Anemia in chronic kidney disease: Secondary | ICD-10-CM | POA: Diagnosis not present

## 2015-08-14 DIAGNOSIS — R197 Diarrhea, unspecified: Secondary | ICD-10-CM | POA: Diagnosis not present

## 2015-08-14 DIAGNOSIS — R51 Headache: Secondary | ICD-10-CM | POA: Diagnosis not present

## 2015-08-14 DIAGNOSIS — D689 Coagulation defect, unspecified: Secondary | ICD-10-CM | POA: Diagnosis not present

## 2015-08-14 DIAGNOSIS — D688 Other specified coagulation defects: Secondary | ICD-10-CM | POA: Diagnosis not present

## 2015-08-14 DIAGNOSIS — N186 End stage renal disease: Secondary | ICD-10-CM | POA: Diagnosis not present

## 2015-08-14 DIAGNOSIS — N2581 Secondary hyperparathyroidism of renal origin: Secondary | ICD-10-CM | POA: Diagnosis not present

## 2015-08-14 DIAGNOSIS — T8249XA Other complication of vascular dialysis catheter, initial encounter: Secondary | ICD-10-CM | POA: Diagnosis not present

## 2015-08-14 DIAGNOSIS — E119 Type 2 diabetes mellitus without complications: Secondary | ICD-10-CM | POA: Diagnosis not present

## 2015-08-14 DIAGNOSIS — D509 Iron deficiency anemia, unspecified: Secondary | ICD-10-CM | POA: Diagnosis not present

## 2015-08-14 MED ORDER — OXYCODONE-ACETAMINOPHEN 10-325 MG PO TABS: 1.0000 | ORAL_TABLET | Freq: Four times a day (QID) | ORAL | Status: DC | PRN

## 2015-08-14 NOTE — Telephone Encounter (Signed)
Pt Informed Ottis Stain, CMA

## 2015-08-14 NOTE — Telephone Encounter (Signed)
Dear John [name] Team Please let him know itis up front ready for pick up [facility]! [name]

## 2015-08-15 DIAGNOSIS — E785 Hyperlipidemia, unspecified: Secondary | ICD-10-CM | POA: Diagnosis not present

## 2015-08-15 DIAGNOSIS — Z89512 Acquired absence of left leg below knee: Secondary | ICD-10-CM | POA: Diagnosis not present

## 2015-08-15 DIAGNOSIS — J449 Chronic obstructive pulmonary disease, unspecified: Secondary | ICD-10-CM | POA: Diagnosis not present

## 2015-08-15 DIAGNOSIS — E1151 Type 2 diabetes mellitus with diabetic peripheral angiopathy without gangrene: Secondary | ICD-10-CM | POA: Diagnosis not present

## 2015-08-15 DIAGNOSIS — G8929 Other chronic pain: Secondary | ICD-10-CM | POA: Diagnosis not present

## 2015-08-15 DIAGNOSIS — E1140 Type 2 diabetes mellitus with diabetic neuropathy, unspecified: Secondary | ICD-10-CM | POA: Diagnosis not present

## 2015-08-15 DIAGNOSIS — I120 Hypertensive chronic kidney disease with stage 5 chronic kidney disease or end stage renal disease: Secondary | ICD-10-CM | POA: Diagnosis not present

## 2015-08-15 DIAGNOSIS — Z992 Dependence on renal dialysis: Secondary | ICD-10-CM | POA: Diagnosis not present

## 2015-08-15 DIAGNOSIS — D649 Anemia, unspecified: Secondary | ICD-10-CM | POA: Diagnosis not present

## 2015-08-15 DIAGNOSIS — N186 End stage renal disease: Secondary | ICD-10-CM | POA: Diagnosis not present

## 2015-08-15 DIAGNOSIS — E1122 Type 2 diabetes mellitus with diabetic chronic kidney disease: Secondary | ICD-10-CM | POA: Diagnosis not present

## 2015-08-15 DIAGNOSIS — Z7982 Long term (current) use of aspirin: Secondary | ICD-10-CM | POA: Diagnosis not present

## 2015-08-15 DIAGNOSIS — Z794 Long term (current) use of insulin: Secondary | ICD-10-CM | POA: Diagnosis not present

## 2015-08-15 DIAGNOSIS — Z4781 Encounter for orthopedic aftercare following surgical amputation: Secondary | ICD-10-CM | POA: Diagnosis not present

## 2015-08-16 DIAGNOSIS — R197 Diarrhea, unspecified: Secondary | ICD-10-CM | POA: Diagnosis not present

## 2015-08-16 DIAGNOSIS — R52 Pain, unspecified: Secondary | ICD-10-CM | POA: Diagnosis not present

## 2015-08-16 DIAGNOSIS — T8249XA Other complication of vascular dialysis catheter, initial encounter: Secondary | ICD-10-CM | POA: Diagnosis not present

## 2015-08-16 DIAGNOSIS — D689 Coagulation defect, unspecified: Secondary | ICD-10-CM | POA: Diagnosis not present

## 2015-08-16 DIAGNOSIS — E119 Type 2 diabetes mellitus without complications: Secondary | ICD-10-CM | POA: Diagnosis not present

## 2015-08-16 DIAGNOSIS — D631 Anemia in chronic kidney disease: Secondary | ICD-10-CM | POA: Diagnosis not present

## 2015-08-16 DIAGNOSIS — N186 End stage renal disease: Secondary | ICD-10-CM | POA: Diagnosis not present

## 2015-08-16 DIAGNOSIS — D509 Iron deficiency anemia, unspecified: Secondary | ICD-10-CM | POA: Diagnosis not present

## 2015-08-16 DIAGNOSIS — N2581 Secondary hyperparathyroidism of renal origin: Secondary | ICD-10-CM | POA: Diagnosis not present

## 2015-08-16 DIAGNOSIS — R51 Headache: Secondary | ICD-10-CM | POA: Diagnosis not present

## 2015-08-16 DIAGNOSIS — D688 Other specified coagulation defects: Secondary | ICD-10-CM | POA: Diagnosis not present

## 2015-08-17 DIAGNOSIS — J449 Chronic obstructive pulmonary disease, unspecified: Secondary | ICD-10-CM | POA: Diagnosis not present

## 2015-08-17 DIAGNOSIS — E1122 Type 2 diabetes mellitus with diabetic chronic kidney disease: Secondary | ICD-10-CM | POA: Diagnosis not present

## 2015-08-17 DIAGNOSIS — G8929 Other chronic pain: Secondary | ICD-10-CM | POA: Diagnosis not present

## 2015-08-17 DIAGNOSIS — I120 Hypertensive chronic kidney disease with stage 5 chronic kidney disease or end stage renal disease: Secondary | ICD-10-CM | POA: Diagnosis not present

## 2015-08-17 DIAGNOSIS — D649 Anemia, unspecified: Secondary | ICD-10-CM | POA: Diagnosis not present

## 2015-08-17 DIAGNOSIS — E1151 Type 2 diabetes mellitus with diabetic peripheral angiopathy without gangrene: Secondary | ICD-10-CM | POA: Diagnosis not present

## 2015-08-17 DIAGNOSIS — Z89512 Acquired absence of left leg below knee: Secondary | ICD-10-CM | POA: Diagnosis not present

## 2015-08-17 DIAGNOSIS — E1140 Type 2 diabetes mellitus with diabetic neuropathy, unspecified: Secondary | ICD-10-CM | POA: Diagnosis not present

## 2015-08-17 DIAGNOSIS — E785 Hyperlipidemia, unspecified: Secondary | ICD-10-CM | POA: Diagnosis not present

## 2015-08-17 DIAGNOSIS — N186 End stage renal disease: Secondary | ICD-10-CM | POA: Diagnosis not present

## 2015-08-17 DIAGNOSIS — Z4781 Encounter for orthopedic aftercare following surgical amputation: Secondary | ICD-10-CM | POA: Diagnosis not present

## 2015-08-17 DIAGNOSIS — Z7982 Long term (current) use of aspirin: Secondary | ICD-10-CM | POA: Diagnosis not present

## 2015-08-17 DIAGNOSIS — Z794 Long term (current) use of insulin: Secondary | ICD-10-CM | POA: Diagnosis not present

## 2015-08-17 DIAGNOSIS — Z992 Dependence on renal dialysis: Secondary | ICD-10-CM | POA: Diagnosis not present

## 2015-08-18 DIAGNOSIS — D689 Coagulation defect, unspecified: Secondary | ICD-10-CM | POA: Diagnosis not present

## 2015-08-18 DIAGNOSIS — D688 Other specified coagulation defects: Secondary | ICD-10-CM | POA: Diagnosis not present

## 2015-08-18 DIAGNOSIS — N2581 Secondary hyperparathyroidism of renal origin: Secondary | ICD-10-CM | POA: Diagnosis not present

## 2015-08-18 DIAGNOSIS — N186 End stage renal disease: Secondary | ICD-10-CM | POA: Diagnosis not present

## 2015-08-18 DIAGNOSIS — R197 Diarrhea, unspecified: Secondary | ICD-10-CM | POA: Diagnosis not present

## 2015-08-18 DIAGNOSIS — D509 Iron deficiency anemia, unspecified: Secondary | ICD-10-CM | POA: Diagnosis not present

## 2015-08-18 DIAGNOSIS — T8249XA Other complication of vascular dialysis catheter, initial encounter: Secondary | ICD-10-CM | POA: Diagnosis not present

## 2015-08-18 DIAGNOSIS — E119 Type 2 diabetes mellitus without complications: Secondary | ICD-10-CM | POA: Diagnosis not present

## 2015-08-18 DIAGNOSIS — R51 Headache: Secondary | ICD-10-CM | POA: Diagnosis not present

## 2015-08-18 DIAGNOSIS — R52 Pain, unspecified: Secondary | ICD-10-CM | POA: Diagnosis not present

## 2015-08-18 DIAGNOSIS — D631 Anemia in chronic kidney disease: Secondary | ICD-10-CM | POA: Diagnosis not present

## 2015-08-20 DIAGNOSIS — J449 Chronic obstructive pulmonary disease, unspecified: Secondary | ICD-10-CM | POA: Diagnosis not present

## 2015-08-20 DIAGNOSIS — G8929 Other chronic pain: Secondary | ICD-10-CM | POA: Diagnosis not present

## 2015-08-20 DIAGNOSIS — Z7982 Long term (current) use of aspirin: Secondary | ICD-10-CM | POA: Diagnosis not present

## 2015-08-20 DIAGNOSIS — E1151 Type 2 diabetes mellitus with diabetic peripheral angiopathy without gangrene: Secondary | ICD-10-CM | POA: Diagnosis not present

## 2015-08-20 DIAGNOSIS — Z992 Dependence on renal dialysis: Secondary | ICD-10-CM | POA: Diagnosis not present

## 2015-08-20 DIAGNOSIS — Z794 Long term (current) use of insulin: Secondary | ICD-10-CM | POA: Diagnosis not present

## 2015-08-20 DIAGNOSIS — Z89512 Acquired absence of left leg below knee: Secondary | ICD-10-CM | POA: Diagnosis not present

## 2015-08-20 DIAGNOSIS — E785 Hyperlipidemia, unspecified: Secondary | ICD-10-CM | POA: Diagnosis not present

## 2015-08-20 DIAGNOSIS — I120 Hypertensive chronic kidney disease with stage 5 chronic kidney disease or end stage renal disease: Secondary | ICD-10-CM | POA: Diagnosis not present

## 2015-08-20 DIAGNOSIS — E1122 Type 2 diabetes mellitus with diabetic chronic kidney disease: Secondary | ICD-10-CM | POA: Diagnosis not present

## 2015-08-20 DIAGNOSIS — E1140 Type 2 diabetes mellitus with diabetic neuropathy, unspecified: Secondary | ICD-10-CM | POA: Diagnosis not present

## 2015-08-20 DIAGNOSIS — D649 Anemia, unspecified: Secondary | ICD-10-CM | POA: Diagnosis not present

## 2015-08-20 DIAGNOSIS — Z4781 Encounter for orthopedic aftercare following surgical amputation: Secondary | ICD-10-CM | POA: Diagnosis not present

## 2015-08-20 DIAGNOSIS — N186 End stage renal disease: Secondary | ICD-10-CM | POA: Diagnosis not present

## 2015-08-21 DIAGNOSIS — R52 Pain, unspecified: Secondary | ICD-10-CM | POA: Diagnosis not present

## 2015-08-21 DIAGNOSIS — R51 Headache: Secondary | ICD-10-CM | POA: Diagnosis not present

## 2015-08-21 DIAGNOSIS — I6789 Other cerebrovascular disease: Secondary | ICD-10-CM | POA: Diagnosis not present

## 2015-08-21 DIAGNOSIS — D631 Anemia in chronic kidney disease: Secondary | ICD-10-CM | POA: Diagnosis not present

## 2015-08-21 DIAGNOSIS — D689 Coagulation defect, unspecified: Secondary | ICD-10-CM | POA: Diagnosis not present

## 2015-08-21 DIAGNOSIS — D688 Other specified coagulation defects: Secondary | ICD-10-CM | POA: Diagnosis not present

## 2015-08-21 DIAGNOSIS — R197 Diarrhea, unspecified: Secondary | ICD-10-CM | POA: Diagnosis not present

## 2015-08-21 DIAGNOSIS — S98929A Partial traumatic amputation of unspecified foot, level unspecified, initial encounter: Secondary | ICD-10-CM | POA: Diagnosis not present

## 2015-08-21 DIAGNOSIS — D509 Iron deficiency anemia, unspecified: Secondary | ICD-10-CM | POA: Diagnosis not present

## 2015-08-21 DIAGNOSIS — N186 End stage renal disease: Secondary | ICD-10-CM | POA: Diagnosis not present

## 2015-08-21 DIAGNOSIS — N2581 Secondary hyperparathyroidism of renal origin: Secondary | ICD-10-CM | POA: Diagnosis not present

## 2015-08-21 DIAGNOSIS — E119 Type 2 diabetes mellitus without complications: Secondary | ICD-10-CM | POA: Diagnosis not present

## 2015-08-21 DIAGNOSIS — T8249XA Other complication of vascular dialysis catheter, initial encounter: Secondary | ICD-10-CM | POA: Diagnosis not present

## 2015-08-22 ENCOUNTER — Ambulatory Visit (INDEPENDENT_AMBULATORY_CARE_PROVIDER_SITE_OTHER): Payer: Medicare Other | Admitting: Family Medicine

## 2015-08-22 VITALS — BP 153/66 | HR 78 | Temp 98.1°F | Ht 69.0 in

## 2015-08-22 DIAGNOSIS — Z7189 Other specified counseling: Secondary | ICD-10-CM | POA: Diagnosis not present

## 2015-08-22 DIAGNOSIS — L97922 Non-pressure chronic ulcer of unspecified part of left lower leg with fat layer exposed: Secondary | ICD-10-CM

## 2015-08-22 DIAGNOSIS — G8929 Other chronic pain: Secondary | ICD-10-CM

## 2015-08-22 DIAGNOSIS — M79602 Pain in left arm: Secondary | ICD-10-CM

## 2015-08-22 MED ORDER — OXYCODONE-ACETAMINOPHEN 10-325 MG PO TABS: ORAL_TABLET | ORAL | Status: AC

## 2015-08-22 NOTE — Patient Instructions (Signed)
Appointment with Dr Creig Hines at [facility] on [name] Friday the 24th at 2:30. I have cancelled the appt with Dr Fredna Dow on that same day as you need to see the vascular doctor first.

## 2015-08-22 NOTE — Progress Notes (Signed)
   Subjective:    Patient ID: John [name], male    DOB: 09-Aug-1955, 60 y.o.   MRN: [id number]  HPI   #1. Left hand pain. Skin is darkening. He is quite concerned he is going to lose the hand. He saw the hand surgeon but is a little confused about what they want him to due to next.  #2. Pain is continuous , only mildly relieved by his current pain regimen. He's having a lot of phantom limb pain in the area of the amputation. Left hand is 9 out of 10 most of the time. This gets worse with dialysis. #3. Skin ulceration the left posterior thigh. We have been having home health dress this. He's here today for further evaluation. Seems to be improving.  Review of Systems  no fever, sweats, chills. No new drainage from the left skin ulcer. He is having pain and other symptoms as per history of present illness    Objective:   Physical Exam  vital signs are reviewed GENERAL:  Thin male , no acute distress EXTREMITY: Left hand is cool to touch. He has hyperpigmentation all fingers from the PIP distally. The left DIP joints has a small skin ulcer over it.  The skin in this area  Over the DIP #5appears to be contracting.  He has limited flexion extension MCP, DIP, PIP all fingers. Sensation is hyperesthetic palm and dorsal surface of the hand.   VASCULAR: is a bruit in the left arm.       Assessment & Plan:   discussed with Dr. Fredna Dow. Have clarified his appointments. He needs vascular workup for he has anything more done with the bones or soft tissue of his hand. I'm concerned he is going to have some loss of digits at this point because her looking fairly ischemic. Greater than 50% of our 35 minute office visit was spent in counseling and education regarding these issues.

## 2015-08-22 NOTE — Assessment & Plan Note (Signed)
Refilled his pain medicines.

## 2015-08-22 NOTE — Assessment & Plan Note (Signed)
Long discussion. I added up calling his hand surgeon spoke with them about situation. They will him to see Dr. Geryl Councilman is a basilar surgeon to further evaluate the AV fistula in his left arm. They said there is no reason to do any type of surgery on the left hand until the blood supplies fixed. I confirmed his appointments, returns down for him. Greater than 50% of our 35 minute office visit was spent in counseling and education regarding these issues,  Coordination of the areas doctors opinions in importance.

## 2015-08-22 NOTE — Assessment & Plan Note (Signed)
Leg ulcer significantly improved by about 50%. We'll continue current home health dressing changes. I re- dressed the wound today in clinic.

## 2015-08-23 ENCOUNTER — Ambulatory Visit (INDEPENDENT_AMBULATORY_CARE_PROVIDER_SITE_OTHER): Payer: Medicare Other | Admitting: Vascular Surgery

## 2015-08-23 ENCOUNTER — Telehealth: Payer: Self-pay

## 2015-08-23 ENCOUNTER — Encounter: Payer: Self-pay | Admitting: Vascular Surgery

## 2015-08-23 ENCOUNTER — Ambulatory Visit (HOSPITAL_COMMUNITY)
Admission: RE | Admit: 2015-08-23 | Discharge: 2015-08-23 | Disposition: A | Discharge status: Home / Self Care | Payer: Medicare Other | Source: Ambulatory Visit | Attending: Vascular Surgery | Admitting: Vascular Surgery

## 2015-08-23 ENCOUNTER — Other Ambulatory Visit: Payer: Self-pay

## 2015-08-23 VITALS — BP 133/77 | HR 93 | Temp 98.6°F | Ht 69.0 in | Wt 164.0 lb

## 2015-08-23 DIAGNOSIS — D688 Other specified coagulation defects: Secondary | ICD-10-CM | POA: Diagnosis not present

## 2015-08-23 DIAGNOSIS — E119 Type 2 diabetes mellitus without complications: Secondary | ICD-10-CM | POA: Diagnosis not present

## 2015-08-23 DIAGNOSIS — Y832 Surgical operation with anastomosis, bypass or graft as the cause of abnormal reaction of the patient, or of later complication, without mention of misadventure at the time of the procedure: Secondary | ICD-10-CM | POA: Diagnosis not present

## 2015-08-23 DIAGNOSIS — R197 Diarrhea, unspecified: Secondary | ICD-10-CM | POA: Diagnosis not present

## 2015-08-23 DIAGNOSIS — Z87891 Personal history of nicotine dependence: Secondary | ICD-10-CM | POA: Insufficient documentation

## 2015-08-23 DIAGNOSIS — D631 Anemia in chronic kidney disease: Secondary | ICD-10-CM | POA: Diagnosis not present

## 2015-08-23 DIAGNOSIS — Z992 Dependence on renal dialysis: Secondary | ICD-10-CM | POA: Diagnosis not present

## 2015-08-23 DIAGNOSIS — T8249XA Other complication of vascular dialysis catheter, initial encounter: Secondary | ICD-10-CM | POA: Diagnosis not present

## 2015-08-23 DIAGNOSIS — N186 End stage renal disease: Secondary | ICD-10-CM

## 2015-08-23 DIAGNOSIS — T82898S Other specified complication of vascular prosthetic devices, implants and grafts, sequela: Secondary | ICD-10-CM | POA: Insufficient documentation

## 2015-08-23 DIAGNOSIS — D509 Iron deficiency anemia, unspecified: Secondary | ICD-10-CM | POA: Diagnosis not present

## 2015-08-23 DIAGNOSIS — R52 Pain, unspecified: Secondary | ICD-10-CM | POA: Diagnosis not present

## 2015-08-23 DIAGNOSIS — D689 Coagulation defect, unspecified: Secondary | ICD-10-CM | POA: Diagnosis not present

## 2015-08-23 DIAGNOSIS — E785 Hyperlipidemia, unspecified: Secondary | ICD-10-CM | POA: Insufficient documentation

## 2015-08-23 DIAGNOSIS — T829XXS Unspecified complication of cardiac and vascular prosthetic device, implant and graft, sequela: Secondary | ICD-10-CM

## 2015-08-23 DIAGNOSIS — I10 Essential (primary) hypertension: Secondary | ICD-10-CM | POA: Insufficient documentation

## 2015-08-23 DIAGNOSIS — R51 Headache: Secondary | ICD-10-CM | POA: Diagnosis not present

## 2015-08-23 DIAGNOSIS — N2581 Secondary hyperparathyroidism of renal origin: Secondary | ICD-10-CM | POA: Diagnosis not present

## 2015-08-23 NOTE — Progress Notes (Signed)
[facility] [place]  Patient name: John [name] MRN: [id number] DOB: 1955/09/25 Sex: male  REASON FOR CONSULT: Steel syndrome left arm.   HPI: John [name] is a 60 y.o. male, who is sent over today as an add-on because of the concern for steal syndrome. He underwent placement of a left brachiocephalic AV fistula on [id number]. He dialyzes on Tuesdays Thursdays and Saturdays using his left upper arm fistula. He tells me that he developed a wound on his little finger of the left hand in August 2016 and subsequently developed a wound on his middle finger. He was seen as an add on today and I do not have any records from the nephrologist or any other consulting physicians. He tells me that he is followed by one of the hand surgeons. He does describe some pain where he has wounds on the left small and middle fingers.   Of note, this patient had presented with a gangrenous wound of the right foot in 2014 and underwent a right axillofemoral bypass and right femoropopliteal bypass. I saw the patient in follow up in July 2014 and then he was lost to follow up. At that time, his toe amputations on the right foot had healed. He's also had a previous left carotid endarterectomy. He was due for a follow up in March 2015 but was lost to follow up.  Past Medical History  Diagnosis Date  . COPD (chronic obstructive pulmonary disease) ([place])   . Chronic back pain     Chronic narcotics for this for many years  . Diabetes mellitus   . Hyperlipidemia   . Hypertension   . Foot pain   . Peripheral vascular disease ([place])   . Asthma   . Stroke ([place])     No residual "Mini strokes"  . Ulcer 1985  . Fracture of foot     Compond- Right  . Neuromuscular disorder ([place])     periferal neuropathy  . Chronic kidney disease     CKD stage III    Family History  Problem Relation Age of Onset  . Diabetes Mother   . Heart disease Mother     before age 70  . Hypertension Mother   . Heart  attack Mother   . Diabetes Father     Amputation  . Heart disease Father     Before age 46  . Hypertension Father   . Heart attack Father   . Other Father     bleeding problems  . Diabetes Brother   . Heart disease Brother     Before age 86  . Hypertension Brother   . Heart attack Brother     SOCIAL HISTORY: Social History   Social History  . Marital Status: Married    Spouse Name: [name]  . Number of Children: 4  . Years of Education: 11   Occupational History  . 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 Smoking status: Current Every Day Smoker -- 0.25 packs/day for 40 years    Types: Cigarettes    Last Attempt to Quit: 01/30/2014  . Smokeless tobacco: Never Used     Comment: 2-3 cigarettes a day  . Alcohol Use: No     Comment: Previous drinker - However, quit in 2006  . Drug Use: No  . Sexual Activity: No   Other Topics Concern  . Not on file   Social History Narrative   Health Care POA:  Emergency Contact: wife, Sai Licursi [phone]   End of Life Plan: gave patient AD pamphlet.   Who lives with you: wife   Any pets: 1 dog   Diet: Pt has a varied diet of protein, starch and vegetables.   Exercise: Pt reports walking around yard.    Seatbelts: Pt reports wearing seatbelt when in vehicles.    Hobbies: fishing                Allergies  Allergen Reactions  . Nicoderm [Nicotine] Itching and Rash  . Gabapentin Other (See Comments)    Can't walk, shuts legs down  . Vicodin [Hydrocodone-Acetaminophen] Hives and Itching    Current Outpatient Prescriptions  Medication Sig Dispense Refill  . aspirin 81 MG chewable tablet Chew 81 mg by mouth daily.     Marland Kitchen atorvastatin (LIPITOR) 40 MG tablet Take 40 mg by mouth every evening.    . cholecalciferol (VITAMIN D) 1000 UNITS tablet Take 1,000 Units by mouth daily.    . collagenase (SANTYL) ointment Apply 1 application topically daily. Please apply to left popliteal fossa 15 g 0  . collagenase  (SANTYL) ointment Apply 1 application topically daily. 90 g 3  . fentaNYL (DURAGESIC - DOSED MCG/HR) 12 MCG/HR Place 1 patch (12.5 mcg total) onto the skin every 3 (three) days. Change patch tomorrow 5 patch 0  . insulin glargine (LANTUS) 100 UNIT/ML injection Inject 0.05 mLs (5 Units total) into the skin daily. 30 mL 0  . insulin lispro (HUMALOG) 100 UNIT/ML injection Inject 2-3 Units into the skin 2 (two) times daily as needed for high blood sugar (CBG >180). Per sliding scale: inject 2 units subcutaneously for CBG 180-299, inject 3 units for CBG >300    . lanthanum (FOSRENOL) 1000 MG chewable tablet Chew 1,000 mg by mouth See admin instructions. Take 1 tablet (1000 mg) by mouth with large meals (once every 2-3 days)    . lidocaine-EPINEPHrine-tetracaine (LET) GEL Apply topically to left arm 1 hour before dialysis 1 Syringe 2  . multivitamin (RENA-VIT) TABS tablet Take 1 tablet by mouth daily.     Marland Kitchen oxyCODONE-acetaminophen (PERCOCET) 10-325 MG tablet Take one by mouth  Every 6 hours prn pain do not fill before September 14 2015 120 tablet 0  . PROAIR HFA 108 (90 Base) MCG/ACT inhaler INHALE 2 PUFFS BY MOUTH EVERY 4 HOURS AS NEEDED FOR WHEEZING OR SHORTNESS OF BREATH 8.5 g 0  . docusate sodium (COLACE) 100 MG capsule Take 1 capsule (100 mg total) by mouth 2 (two) times daily. (Patient not taking: Reported on 08/23/2015) 100 capsule 0  . hydrocerin (EUCERIN) CREA Apply 1 application topically 2 (two) times daily. (Patient not taking: Reported on 08/23/2015)  0  . lidocaine-prilocaine (EMLA) cream Apply 1 application topically 3 (three) times a week. Reported on 08/23/2015    . methocarbamol (ROBAXIN) 500 MG tablet Take 1 tablet (500 mg total) by mouth every 6 (six) hours as needed for muscle spasms. (Patient not taking: Reported on 08/23/2015) 90 tablet 0  . [DISCONTINUED] tiotropium (SPIRIVA HANDIHALER) 18 MCG inhalation capsule Place 1 capsule (18 mcg total) into inhaler and inhale daily. 30 capsule 2   No  current facility-administered medications for this visit.   Facility-Administered Medications Ordered in Other Visits  Medication Dose Route Frequency Provider Last Rate Last Dose  . ferumoxytol (FERAHEME) 1,020 mg in sodium chloride 0.9 % 100 mL IVPB  1,020 mg Intravenous Once Corliss Parish, MD  REVIEW OF SYSTEMS:  [X]  denotes positive finding, [ ]  denotes negative finding Cardiac  Comments:  Chest pain or chest pressure:    Shortness of breath upon exertion:    Short of breath when lying flat:    Irregular heart rhythm:        Vascular    Pain in calf, thigh, or hip brought on by ambulation:  He has a left below-the-knee amputation and essentially is nonambulatory. He does not have a prosthesis.  Pain in feet at night that wakes you up from your sleep:     Blood clot in your veins:    Leg swelling:         Pulmonary    Oxygen at home:    Productive cough:     Wheezing:         Neurologic    Sudden weakness in arms or legs:     Sudden numbness in arms or legs:     Sudden onset of difficulty speaking or slurred speech:    Temporary loss of vision in one eye:     Problems with dizziness:         Gastrointestinal    Blood in stool:     Vomited blood:         Genitourinary    Burning when urinating:     Blood in urine:        Psychiatric    Major depression:         Hematologic    Bleeding problems:    Problems with blood clotting too easily:        Skin    Rashes or ulcers:        Constitutional    Fever or chills:      PHYSICAL EXAM: Filed Vitals:   08/23/15 1245  BP: 133/77  Pulse: 93  Temp: 98.6 F (37 C)  TempSrc: Oral  Height: 5\' 9"  (1.753 m)  Weight: 164 lb (74.39 kg)  SpO2: 97%    GENERAL: The patient is a well-nourished male, in no acute distress. The vital signs are documented above. CARDIAC: There is a regular rate and rhythm.  VASCULAR: I cannot palpate a radial pulse of the left wrist. He does have a monophasic radial and  ulnar signal with the Doppler. I cannot obtain a palmar arch signal. PULMONARY: There is good air exchange bilaterally without wheezing or rales. ABDOMEN: Soft and non-tender with normal pitched bowel sounds.  MUSCULOSKELETAL: He has a left below the knee amputation. NEUROLOGIC: No focal weakness or paresthesias are detected. SKIN: he has an eschar that is partially circumferential involving the distal interphalangeal joint of the small finger and also a small wound over the distal interphalangeal joint of the long finger. PSYCHIATRIC: The patient has a normal affect.  DATA:   UPPER EXTREMITY ARTERIAL DOPPLER: I have independently interpreted his upper extremity arterial Doppler study. The patient has brisk but monophasic Doppler signals in the radial and ulnar positions in the left upper extremity. Signal cannot be obtained in the digits on the left at rest or with compression of the fistula.  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 He has no signals in the digits even with compression of his fistula and I do not think that a DRIL procedure would be adequate for healing any  surgery that he might require on his digits of the left hand. Even that he has a right axillofemoral bypass graft is tunneled dialysis catheter will have to be placed on the left side. We will have to reconsider his access options once the hand issues are resolved. Given his axillofemoral bypass graft on the right he might need to be considered for thigh graft on the left where he has a below-the-knee amputation. He does not have a prosthesis. His surgery is scheduled for Monday with Dr. Donnetta Hutching.  Tomorrow's schedule is full and he has had the wounds since August. Discussed the procedure and potential complications and he is agreeable to proceed on  Monday.   Deitra Mayo [facility] [place]: [phone]

## 2015-08-23 NOTE — Telephone Encounter (Signed)
rec'd referral from [facility].  Reported that Dr. Jimmy Footman wants pt. Seen ASAP for areas of black discoloration of middle and 5th finger left hand.  Nurse reported that the left hand is overall a little darker and shiny.  Stated "his hand looks bad."  Discussed with Dr. Scot Dock.  rec'd verbal order to get a steal study today and schedule for ov.  Notified [facility] [facility] of appt. For pt. At 12:00 PM.

## 2015-08-24 ENCOUNTER — Encounter (HOSPITAL_COMMUNITY): Payer: Self-pay | Admitting: *Deleted

## 2015-08-24 DIAGNOSIS — Z4781 Encounter for orthopedic aftercare following surgical amputation: Secondary | ICD-10-CM | POA: Diagnosis not present

## 2015-08-24 DIAGNOSIS — N186 End stage renal disease: Secondary | ICD-10-CM | POA: Diagnosis not present

## 2015-08-24 DIAGNOSIS — E1140 Type 2 diabetes mellitus with diabetic neuropathy, unspecified: Secondary | ICD-10-CM | POA: Diagnosis not present

## 2015-08-24 DIAGNOSIS — Z7982 Long term (current) use of aspirin: Secondary | ICD-10-CM | POA: Diagnosis not present

## 2015-08-24 DIAGNOSIS — D649 Anemia, unspecified: Secondary | ICD-10-CM | POA: Diagnosis not present

## 2015-08-24 DIAGNOSIS — E1122 Type 2 diabetes mellitus with diabetic chronic kidney disease: Secondary | ICD-10-CM | POA: Diagnosis not present

## 2015-08-24 DIAGNOSIS — E785 Hyperlipidemia, unspecified: Secondary | ICD-10-CM | POA: Diagnosis not present

## 2015-08-24 DIAGNOSIS — Z992 Dependence on renal dialysis: Secondary | ICD-10-CM | POA: Diagnosis not present

## 2015-08-24 DIAGNOSIS — G8929 Other chronic pain: Secondary | ICD-10-CM | POA: Diagnosis not present

## 2015-08-24 DIAGNOSIS — Z794 Long term (current) use of insulin: Secondary | ICD-10-CM | POA: Diagnosis not present

## 2015-08-24 DIAGNOSIS — J449 Chronic obstructive pulmonary disease, unspecified: Secondary | ICD-10-CM | POA: Diagnosis not present

## 2015-08-24 DIAGNOSIS — I120 Hypertensive chronic kidney disease with stage 5 chronic kidney disease or end stage renal disease: Secondary | ICD-10-CM | POA: Diagnosis not present

## 2015-08-24 DIAGNOSIS — E1151 Type 2 diabetes mellitus with diabetic peripheral angiopathy without gangrene: Secondary | ICD-10-CM | POA: Diagnosis not present

## 2015-08-24 DIAGNOSIS — Z89512 Acquired absence of left leg below knee: Secondary | ICD-10-CM | POA: Diagnosis not present

## 2015-08-24 NOTE — Progress Notes (Signed)
Patient denies chest pain.  States that CBG runs  90- 140.  Patient instructed to take 1/2 of Lantus Insulin on Monday and if CBG > 220 take 1/2 dose of Humalog.

## 2015-08-25 DIAGNOSIS — D689 Coagulation defect, unspecified: Secondary | ICD-10-CM | POA: Diagnosis not present

## 2015-08-25 DIAGNOSIS — E119 Type 2 diabetes mellitus without complications: Secondary | ICD-10-CM | POA: Diagnosis not present

## 2015-08-25 DIAGNOSIS — R52 Pain, unspecified: Secondary | ICD-10-CM | POA: Diagnosis not present

## 2015-08-25 DIAGNOSIS — D509 Iron deficiency anemia, unspecified: Secondary | ICD-10-CM | POA: Diagnosis not present

## 2015-08-25 DIAGNOSIS — D688 Other specified coagulation defects: Secondary | ICD-10-CM | POA: Diagnosis not present

## 2015-08-25 DIAGNOSIS — N2581 Secondary hyperparathyroidism of renal origin: Secondary | ICD-10-CM | POA: Diagnosis not present

## 2015-08-25 DIAGNOSIS — T8249XA Other complication of vascular dialysis catheter, initial encounter: Secondary | ICD-10-CM | POA: Diagnosis not present

## 2015-08-25 DIAGNOSIS — D631 Anemia in chronic kidney disease: Secondary | ICD-10-CM | POA: Diagnosis not present

## 2015-08-25 DIAGNOSIS — N186 End stage renal disease: Secondary | ICD-10-CM | POA: Diagnosis not present

## 2015-08-25 DIAGNOSIS — R197 Diarrhea, unspecified: Secondary | ICD-10-CM | POA: Diagnosis not present

## 2015-08-25 DIAGNOSIS — R51 Headache: Secondary | ICD-10-CM | POA: Diagnosis not present

## 2015-08-26 MED ORDER — DEXTROSE 5 % IV SOLN
1.5000 g | INTRAVENOUS | Status: AC
  Administered 2015-08-27: 1.5 g via INTRAVENOUS
  Filled 2015-08-26: qty 1.5

## 2015-08-27 ENCOUNTER — Other Ambulatory Visit: Payer: Self-pay | Admitting: *Deleted

## 2015-08-27 ENCOUNTER — Encounter (HOSPITAL_COMMUNITY): Payer: Self-pay | Admitting: *Deleted

## 2015-08-27 ENCOUNTER — Ambulatory Visit (HOSPITAL_COMMUNITY): Payer: Medicare Other

## 2015-08-27 ENCOUNTER — Ambulatory Visit (HOSPITAL_COMMUNITY): Payer: Medicare Other | Admitting: Certified Registered"

## 2015-08-27 ENCOUNTER — Ambulatory Visit (HOSPITAL_COMMUNITY)
Admission: RE | Admit: 2015-08-27 | Discharge: 2015-08-27 | Disposition: A | Discharge status: Home / Self Care | Payer: Medicare Other | Source: Ambulatory Visit | Attending: Vascular Surgery | Admitting: Vascular Surgery

## 2015-08-27 ENCOUNTER — Encounter (HOSPITAL_COMMUNITY)
Admission: RE | Disposition: A | Discharge status: Home / Self Care | Payer: Self-pay | Source: Ambulatory Visit | Attending: Vascular Surgery

## 2015-08-27 DIAGNOSIS — M1990 Unspecified osteoarthritis, unspecified site: Secondary | ICD-10-CM | POA: Diagnosis not present

## 2015-08-27 DIAGNOSIS — R911 Solitary pulmonary nodule: Secondary | ICD-10-CM | POA: Diagnosis not present

## 2015-08-27 DIAGNOSIS — Y832 Surgical operation with anastomosis, bypass or graft as the cause of abnormal reaction of the patient, or of later complication, without mention of misadventure at the time of the procedure: Secondary | ICD-10-CM | POA: Insufficient documentation

## 2015-08-27 DIAGNOSIS — Z89512 Acquired absence of left leg below knee: Secondary | ICD-10-CM | POA: Insufficient documentation

## 2015-08-27 DIAGNOSIS — E785 Hyperlipidemia, unspecified: Secondary | ICD-10-CM | POA: Diagnosis not present

## 2015-08-27 DIAGNOSIS — T82898A Other specified complication of vascular prosthetic devices, implants and grafts, initial encounter: Secondary | ICD-10-CM | POA: Insufficient documentation

## 2015-08-27 DIAGNOSIS — N185 Chronic kidney disease, stage 5: Secondary | ICD-10-CM | POA: Diagnosis not present

## 2015-08-27 DIAGNOSIS — Z419 Encounter for procedure for purposes other than remedying health state, unspecified: Secondary | ICD-10-CM

## 2015-08-27 DIAGNOSIS — J45909 Unspecified asthma, uncomplicated: Secondary | ICD-10-CM | POA: Diagnosis not present

## 2015-08-27 DIAGNOSIS — Z79899 Other long term (current) drug therapy: Secondary | ICD-10-CM | POA: Diagnosis not present

## 2015-08-27 DIAGNOSIS — E1122 Type 2 diabetes mellitus with diabetic chronic kidney disease: Secondary | ICD-10-CM | POA: Diagnosis not present

## 2015-08-27 DIAGNOSIS — I70208 Unspecified atherosclerosis of native arteries of extremities, other extremity: Secondary | ICD-10-CM | POA: Insufficient documentation

## 2015-08-27 DIAGNOSIS — Z95828 Presence of other vascular implants and grafts: Secondary | ICD-10-CM

## 2015-08-27 DIAGNOSIS — Z7982 Long term (current) use of aspirin: Secondary | ICD-10-CM | POA: Insufficient documentation

## 2015-08-27 DIAGNOSIS — I739 Peripheral vascular disease, unspecified: Secondary | ICD-10-CM

## 2015-08-27 DIAGNOSIS — N186 End stage renal disease: Secondary | ICD-10-CM | POA: Diagnosis not present

## 2015-08-27 DIAGNOSIS — I998 Other disorder of circulatory system: Secondary | ICD-10-CM | POA: Diagnosis present

## 2015-08-27 DIAGNOSIS — Z48812 Encounter for surgical aftercare following surgery on the circulatory system: Secondary | ICD-10-CM

## 2015-08-27 DIAGNOSIS — Z794 Long term (current) use of insulin: Secondary | ICD-10-CM | POA: Insufficient documentation

## 2015-08-27 DIAGNOSIS — Z992 Dependence on renal dialysis: Secondary | ICD-10-CM | POA: Diagnosis not present

## 2015-08-27 DIAGNOSIS — I120 Hypertensive chronic kidney disease with stage 5 chronic kidney disease or end stage renal disease: Secondary | ICD-10-CM | POA: Diagnosis not present

## 2015-08-27 DIAGNOSIS — J449 Chronic obstructive pulmonary disease, unspecified: Secondary | ICD-10-CM | POA: Diagnosis not present

## 2015-08-27 DIAGNOSIS — F17210 Nicotine dependence, cigarettes, uncomplicated: Secondary | ICD-10-CM | POA: Diagnosis not present

## 2015-08-27 DIAGNOSIS — I6523 Occlusion and stenosis of bilateral carotid arteries: Secondary | ICD-10-CM

## 2015-08-27 DIAGNOSIS — T82510A Breakdown (mechanical) of surgically created arteriovenous fistula, initial encounter: Secondary | ICD-10-CM | POA: Diagnosis not present

## 2015-08-27 HISTORY — DX: Unspecified osteoarthritis, unspecified site: M19.90

## 2015-08-27 HISTORY — PX: LIGATION OF ARTERIOVENOUS  FISTULA: SHX5948

## 2015-08-27 HISTORY — PX: INSERTION OF DIALYSIS CATHETER: SHX1324

## 2015-08-27 LAB — GLUCOSE, CAPILLARY: Glucose-Capillary: 176 mg/dL — ABNORMAL HIGH (ref 65–99)

## 2015-08-27 LAB — POCT I-STAT 4, (NA,K, GLUC, HGB,HCT)
GLUCOSE: 209 mg/dL — AB (ref 65–99)
HCT: 46 % (ref 39.0–52.0)
Hemoglobin: 15.6 g/dL (ref 13.0–17.0)
Potassium: 4 mmol/L (ref 3.5–5.1)
SODIUM: 140 mmol/L (ref 135–145)

## 2015-08-27 SURGERY — LIGATION OF ARTERIOVENOUS  FISTULA
Anesthesia: Monitor Anesthesia Care | Site: Neck | Laterality: Left

## 2015-08-27 MED ORDER — MIDAZOLAM HCL 2 MG/2ML IJ SOLN
INTRAMUSCULAR | Status: AC
  Filled 2015-08-27: qty 2

## 2015-08-27 MED ORDER — HEPARIN SODIUM (PORCINE) 1000 UNIT/ML IJ SOLN
INTRAMUSCULAR | Status: DC | PRN
  Administered 2015-08-27: 1000 [IU]

## 2015-08-27 MED ORDER — HEPARIN SODIUM (PORCINE) 1000 UNIT/ML IJ SOLN
INTRAMUSCULAR | Status: AC
  Filled 2015-08-27: qty 1

## 2015-08-27 MED ORDER — SODIUM CHLORIDE 0.9 % IV SOLN: INTRAVENOUS | Status: DC

## 2015-08-27 MED ORDER — 0.9 % SODIUM CHLORIDE (POUR BTL) OPTIME
TOPICAL | Status: DC | PRN
  Administered 2015-08-27: 1000 mL

## 2015-08-27 MED ORDER — ACETAMINOPHEN 325 MG PO TABS
ORAL_TABLET | ORAL | Status: AC
  Filled 2015-08-27: qty 2

## 2015-08-27 MED ORDER — OXYCODONE HCL 5 MG PO TABS
10.0000 mg | ORAL_TABLET | Freq: Once | ORAL | Status: AC
  Administered 2015-08-27: 10 mg via ORAL

## 2015-08-27 MED ORDER — PROPOFOL 10 MG/ML IV BOLUS
INTRAVENOUS | Status: DC | PRN
  Administered 2015-08-27: 80 mg via INTRAVENOUS

## 2015-08-27 MED ORDER — FENTANYL CITRATE (PF) 100 MCG/2ML IJ SOLN
INTRAMUSCULAR | Status: DC | PRN
  Administered 2015-08-27: 100 ug via INTRAVENOUS
  Administered 2015-08-27 (×3): 50 ug via INTRAVENOUS

## 2015-08-27 MED ORDER — OXYCODONE HCL 5 MG PO TABS
ORAL_TABLET | ORAL | Status: AC
  Filled 2015-08-27: qty 2

## 2015-08-27 MED ORDER — PROPOFOL 10 MG/ML IV BOLUS
INTRAVENOUS | Status: AC
  Filled 2015-08-27: qty 20

## 2015-08-27 MED ORDER — SODIUM CHLORIDE 0.9 % IJ SOLN
INTRAMUSCULAR | Status: AC
  Filled 2015-08-27: qty 10

## 2015-08-27 MED ORDER — MIDAZOLAM HCL 5 MG/5ML IJ SOLN
INTRAMUSCULAR | Status: DC | PRN
  Administered 2015-08-27: 2 mg via INTRAVENOUS

## 2015-08-27 MED ORDER — ONDANSETRON HCL 4 MG/2ML IJ SOLN: 4.0000 mg | Freq: Once | INTRAMUSCULAR | Status: DC | PRN

## 2015-08-27 MED ORDER — PHENYLEPHRINE 40 MCG/ML (10ML) SYRINGE FOR IV PUSH (FOR BLOOD PRESSURE SUPPORT)
PREFILLED_SYRINGE | INTRAVENOUS | Status: AC
  Filled 2015-08-27: qty 10

## 2015-08-27 MED ORDER — SODIUM CHLORIDE 0.9 % IV SOLN
INTRAVENOUS | Status: DC | PRN
  Administered 2015-08-27: 500 mL

## 2015-08-27 MED ORDER — PHENYLEPHRINE HCL 10 MG/ML IJ SOLN
INTRAMUSCULAR | Status: DC | PRN
  Administered 2015-08-27: 80 ug via INTRAVENOUS

## 2015-08-27 MED ORDER — FENTANYL CITRATE (PF) 250 MCG/5ML IJ SOLN
INTRAMUSCULAR | Status: AC
  Filled 2015-08-27: qty 5

## 2015-08-27 MED ORDER — LIDOCAINE-EPINEPHRINE 0.5 %-1:200000 IJ SOLN
INTRAMUSCULAR | Status: AC
  Filled 2015-08-27: qty 1

## 2015-08-27 MED ORDER — PHENYLEPHRINE HCL 10 MG/ML IJ SOLN
INTRAMUSCULAR | Status: AC
  Filled 2015-08-27: qty 1

## 2015-08-27 MED ORDER — CHLORHEXIDINE GLUCONATE CLOTH 2 % EX PADS: 6.0000 | MEDICATED_PAD | Freq: Once | CUTANEOUS | Status: DC

## 2015-08-27 MED ORDER — FENTANYL CITRATE (PF) 100 MCG/2ML IJ SOLN
INTRAMUSCULAR | Status: AC
  Administered 2015-08-27: 25 ug via INTRAVENOUS
  Filled 2015-08-27: qty 2

## 2015-08-27 MED ORDER — FENTANYL CITRATE (PF) 100 MCG/2ML IJ SOLN
25.0000 ug | INTRAMUSCULAR | Status: DC | PRN
  Administered 2015-08-27 (×2): 25 ug via INTRAVENOUS

## 2015-08-27 MED ORDER — ACETAMINOPHEN 325 MG PO TABS
650.0000 mg | ORAL_TABLET | Freq: Once | ORAL | Status: AC
  Administered 2015-08-27: 650 mg via ORAL

## 2015-08-27 MED ORDER — LIDOCAINE-EPINEPHRINE 0.5 %-1:200000 IJ SOLN
INTRAMUSCULAR | Status: DC | PRN
  Administered 2015-08-27: 50 mL

## 2015-08-27 MED ORDER — PROPOFOL 500 MG/50ML IV EMUL
INTRAVENOUS | Status: DC | PRN
  Administered 2015-08-27: 50 ug/kg/min via INTRAVENOUS

## 2015-08-27 MED ORDER — SODIUM CHLORIDE 0.9 % IV SOLN
INTRAVENOUS | Status: DC
  Administered 2015-08-27: 09:00:00 via INTRAVENOUS

## 2015-08-27 SURGICAL SUPPLY — 60 items
APL SKNCLS STERI-STRIP NONHPOA (GAUZE/BANDAGES/DRESSINGS) ×2
BAG DECANTER FOR FLEXI CONT (MISCELLANEOUS) ×4 IMPLANT
BENZOIN TINCTURE PRP APPL 2/3 (GAUZE/BANDAGES/DRESSINGS) ×4 IMPLANT
BIOPATCH RED 1 DISK 7.0 (GAUZE/BANDAGES/DRESSINGS) ×3 IMPLANT
BIOPATCH RED 1IN DISK 7.0MM (GAUZE/BANDAGES/DRESSINGS) ×1
CANISTER SUCTION 2500CC (MISCELLANEOUS) ×4 IMPLANT
CATH PALINDROME RT-P 15FX19CM (CATHETERS) IMPLANT
CATH PALINDROME RT-P 15FX23CM (CATHETERS) ×2 IMPLANT
CATH PALINDROME RT-P 15FX28CM (CATHETERS) IMPLANT
CATH PALINDROME RT-P 15FX55CM (CATHETERS) IMPLANT
CLIP LIGATING EXTRA MED SLVR (CLIP) ×4 IMPLANT
CLIP LIGATING EXTRA SM BLUE (MISCELLANEOUS) ×4 IMPLANT
CLOSURE WOUND 1/2 X4 (GAUZE/BANDAGES/DRESSINGS) ×1
COVER PROBE W GEL 5X96 (DRAPES) ×2 IMPLANT
DECANTER SPIKE VIAL GLASS SM (MISCELLANEOUS) ×4 IMPLANT
DRAPE C-ARM 42X72 X-RAY (DRAPES) ×4 IMPLANT
DRAPE CHEST BREAST 15X10 FENES (DRAPES) ×4 IMPLANT
DRSG COVADERM 4X6 (GAUZE/BANDAGES/DRESSINGS) ×2 IMPLANT
ELECT REM PT RETURN 9FT ADLT (ELECTROSURGICAL) ×4
ELECTRODE REM PT RTRN 9FT ADLT (ELECTROSURGICAL) ×2 IMPLANT
GAUZE SPONGE 2X2 8PLY STRL LF (GAUZE/BANDAGES/DRESSINGS) ×2 IMPLANT
GAUZE SPONGE 4X4 12PLY STRL (GAUZE/BANDAGES/DRESSINGS) ×4 IMPLANT
GAUZE SPONGE 4X4 16PLY XRAY LF (GAUZE/BANDAGES/DRESSINGS) ×4 IMPLANT
GEL ULTRASOUND 20GR AQUASONIC (MISCELLANEOUS) IMPLANT
GLOVE SKINSENSE NS SZ7.0 (GLOVE) ×4
GLOVE SKINSENSE STRL SZ7.0 (GLOVE) IMPLANT
GLOVE SS BIOGEL STRL SZ 7.5 (GLOVE) ×2 IMPLANT
GLOVE SUPERSENSE BIOGEL SZ 7.5 (GLOVE) ×4
GOWN BRE IMP SLV AUR XL STRL (GOWN DISPOSABLE) ×4 IMPLANT
GOWN STRL REUS W/ TWL LRG LVL3 (GOWN DISPOSABLE) ×6 IMPLANT
GOWN STRL REUS W/TWL LRG LVL3 (GOWN DISPOSABLE) ×12
KIT BASIN OR (CUSTOM PROCEDURE TRAY) ×4 IMPLANT
KIT ROOM TURNOVER OR (KITS) ×4 IMPLANT
NDL 18GX1X1/2 (RX/OR ONLY) (NEEDLE) ×2 IMPLANT
NDL HYPO 25GX1X1/2 BEV (NEEDLE) ×2 IMPLANT
NEEDLE 18GX1X1/2 (RX/OR ONLY) (NEEDLE) ×4 IMPLANT
NEEDLE 22X1 1/2 (OR ONLY) (NEEDLE) ×4 IMPLANT
NEEDLE HYPO 25GX1X1/2 BEV (NEEDLE) ×4 IMPLANT
NS IRRIG 1000ML POUR BTL (IV SOLUTION) ×4 IMPLANT
PACK CV ACCESS (CUSTOM PROCEDURE TRAY) ×4 IMPLANT
PACK SURGICAL SETUP 50X90 (CUSTOM PROCEDURE TRAY) ×4 IMPLANT
PAD ARMBOARD 7.5X6 YLW CONV (MISCELLANEOUS) ×8 IMPLANT
SOAP 2 % CHG 4 OZ (WOUND CARE) ×4 IMPLANT
SPONGE GAUZE 2X2 STER 10/PKG (GAUZE/BANDAGES/DRESSINGS) ×2
SPONGE GAUZE 4X4 12PLY STER LF (GAUZE/BANDAGES/DRESSINGS) ×2 IMPLANT
STAPLER VISISTAT 35W (STAPLE) IMPLANT
STRIP CLOSURE SKIN 1/2X4 (GAUZE/BANDAGES/DRESSINGS) ×3 IMPLANT
SUT ETHILON 3 0 PS 1 (SUTURE) ×4 IMPLANT
SUT PROLENE 6 0 CC (SUTURE) IMPLANT
SUT SILK 0 TIES 10X30 (SUTURE) ×4 IMPLANT
SUT VIC AB 3-0 SH 27 (SUTURE) ×4
SUT VIC AB 3-0 SH 27X BRD (SUTURE) ×2 IMPLANT
SUT VICRYL 4-0 PS2 18IN ABS (SUTURE) ×4 IMPLANT
SYR 20CC LL (SYRINGE) ×4 IMPLANT
SYR 5ML LL (SYRINGE) ×8 IMPLANT
SYR CONTROL 10ML LL (SYRINGE) ×4 IMPLANT
SYRINGE 10CC LL (SYRINGE) ×4 IMPLANT
TAPE CLOTH SURG 4X10 WHT LF (GAUZE/BANDAGES/DRESSINGS) ×2 IMPLANT
UNDERPAD 30X30 INCONTINENT (UNDERPADS AND DIAPERS) ×4 IMPLANT
WATER STERILE IRR 1000ML POUR (IV SOLUTION) ×4 IMPLANT

## 2015-08-27 NOTE — H&P (View-Only) (Signed)
[facility] [place]  Patient name: John [name] MRN: [id number] DOB: 28-Sep-1955 Sex: male  REASON FOR CONSULT: Steel syndrome left arm.   HPI: John [name] is a 60 y.o. male, who is sent over today as an add-on because of the concern for steal syndrome. He underwent placement of a left brachiocephalic AV fistula on [id number]. He dialyzes on Tuesdays Thursdays and Saturdays using his left upper arm fistula. He tells me that he developed a wound on his little finger of the left hand in August 2016 and subsequently developed a wound on his middle finger. He was seen as an add on today and I do not have any records from the nephrologist or any other consulting physicians. He tells me that he is followed by one of the hand surgeons. He does describe some pain where he has wounds on the left small and middle fingers.   Of note, this patient had presented with a gangrenous wound of the right foot in 2014 and underwent a right axillofemoral bypass and right femoropopliteal bypass. I saw the patient in follow up in July 2014 and then he was lost to follow up. At that time, his toe amputations on the right foot had healed. He's also had a previous left carotid endarterectomy. He was due for a follow up in March 2015 but was lost to follow up.  Past Medical History  Diagnosis Date  . COPD (chronic obstructive pulmonary disease) ([place])   . Chronic back pain     Chronic narcotics for this for many years  . Diabetes mellitus   . Hyperlipidemia   . Hypertension   . Foot pain   . Peripheral vascular disease ([place])   . Asthma   . Stroke ([place])     No residual "Mini strokes"  . Ulcer 1985  . Fracture of foot     [place]- Right  . Neuromuscular disorder ([place])     periferal neuropathy  . Chronic kidney disease     CKD stage III    Family History  Problem Relation Age of Onset  . Diabetes Mother   . Heart disease Mother     before age 6  . Hypertension Mother   . Heart  attack Mother   . Diabetes Father     Amputation  . Heart disease Father     Before age 85  . Hypertension Father   . Heart attack Father   . Other Father     bleeding problems  . Diabetes Brother   . Heart disease Brother     Before age 30  . Hypertension Brother   . Heart attack Brother     SOCIAL HISTORY: Social History   Social History  . Marital Status: Married    Spouse Name: [name]  . Number of Children: 4  . Years of Education: 11   Occupational History  . 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 Smoking status: Current Every Day Smoker -- 0.25 packs/day for 40 years    Types: Cigarettes    Last Attempt to Quit: 01/30/2014  . Smokeless tobacco: Never Used     Comment: 2-3 cigarettes a day  . Alcohol Use: No     Comment: Previous drinker - However, quit in 2006  . Drug Use: No  . Sexual Activity: No   Other Topics Concern  . Not on file   Social History Narrative   Health Care POA:  Emergency Contact: wife, Truc Hetland [phone]   End of Life Plan: gave patient AD pamphlet.   Who lives with you: wife   Any pets: 1 dog   Diet: Pt has a varied diet of protein, starch and vegetables.   Exercise: Pt reports walking around yard.    Seatbelts: Pt reports wearing seatbelt when in vehicles.    Hobbies: fishing                Allergies  Allergen Reactions  . Nicoderm [Nicotine] Itching and Rash  . Gabapentin Other (See Comments)    Can't walk, shuts legs down  . Vicodin [Hydrocodone-Acetaminophen] Hives and Itching    Current Outpatient Prescriptions  Medication Sig Dispense Refill  . aspirin 81 MG chewable tablet Chew 81 mg by mouth daily.     Marland Kitchen atorvastatin (LIPITOR) 40 MG tablet Take 40 mg by mouth every evening.    . cholecalciferol (VITAMIN D) 1000 UNITS tablet Take 1,000 Units by mouth daily.    . collagenase (SANTYL) ointment Apply 1 application topically daily. Please apply to left popliteal fossa 15 g 0  . collagenase  (SANTYL) ointment Apply 1 application topically daily. 90 g 3  . fentaNYL (DURAGESIC - DOSED MCG/HR) 12 MCG/HR Place 1 patch (12.5 mcg total) onto the skin every 3 (three) days. Change patch tomorrow 5 patch 0  . insulin glargine (LANTUS) 100 UNIT/ML injection Inject 0.05 mLs (5 Units total) into the skin daily. 30 mL 0  . insulin lispro (HUMALOG) 100 UNIT/ML injection Inject 2-3 Units into the skin 2 (two) times daily as needed for high blood sugar (CBG >180). Per sliding scale: inject 2 units subcutaneously for CBG 180-299, inject 3 units for CBG >300    . lanthanum (FOSRENOL) 1000 MG chewable tablet Chew 1,000 mg by mouth See admin instructions. Take 1 tablet (1000 mg) by mouth with large meals (once every 2-3 days)    . lidocaine-EPINEPHrine-tetracaine (LET) GEL Apply topically to left arm 1 hour before dialysis 1 Syringe 2  . multivitamin (RENA-VIT) TABS tablet Take 1 tablet by mouth daily.     Marland Kitchen oxyCODONE-acetaminophen (PERCOCET) 10-325 MG tablet Take one by mouth  Every 6 hours prn pain do not fill before September 14 2015 120 tablet 0  . PROAIR HFA 108 (90 Base) MCG/ACT inhaler INHALE 2 PUFFS BY MOUTH EVERY 4 HOURS AS NEEDED FOR WHEEZING OR SHORTNESS OF BREATH 8.5 g 0  . docusate sodium (COLACE) 100 MG capsule Take 1 capsule (100 mg total) by mouth 2 (two) times daily. (Patient not taking: Reported on 08/23/2015) 100 capsule 0  . hydrocerin (EUCERIN) CREA Apply 1 application topically 2 (two) times daily. (Patient not taking: Reported on 08/23/2015)  0  . lidocaine-prilocaine (EMLA) cream Apply 1 application topically 3 (three) times a week. Reported on 08/23/2015    . methocarbamol (ROBAXIN) 500 MG tablet Take 1 tablet (500 mg total) by mouth every 6 (six) hours as needed for muscle spasms. (Patient not taking: Reported on 08/23/2015) 90 tablet 0  . [DISCONTINUED] tiotropium (SPIRIVA HANDIHALER) 18 MCG inhalation capsule Place 1 capsule (18 mcg total) into inhaler and inhale daily. 30 capsule 2   No  current facility-administered medications for this visit.   Facility-Administered Medications Ordered in Other Visits  Medication Dose Route Frequency Provider Last Rate Last Dose  . ferumoxytol (FERAHEME) 1,020 mg in sodium chloride 0.9 % 100 mL IVPB  1,020 mg Intravenous Once Corliss Parish, MD  REVIEW OF SYSTEMS:  [X]  denotes positive finding, [ ]  denotes negative finding Cardiac  Comments:  Chest pain or chest pressure:    Shortness of breath upon exertion:    Short of breath when lying flat:    Irregular heart rhythm:        Vascular    Pain in calf, thigh, or hip brought on by ambulation:  He has a left below-the-knee amputation and essentially is nonambulatory. He does not have a prosthesis.  Pain in feet at night that wakes you up from your sleep:     Blood clot in your veins:    Leg swelling:         Pulmonary    Oxygen at home:    Productive cough:     Wheezing:         Neurologic    Sudden weakness in arms or legs:     Sudden numbness in arms or legs:     Sudden onset of difficulty speaking or slurred speech:    Temporary loss of vision in one eye:     Problems with dizziness:         Gastrointestinal    Blood in stool:     Vomited blood:         Genitourinary    Burning when urinating:     Blood in urine:        Psychiatric    Major depression:         Hematologic    Bleeding problems:    Problems with blood clotting too easily:        Skin    Rashes or ulcers:        Constitutional    Fever or chills:      PHYSICAL EXAM: Filed Vitals:   08/23/15 1245  BP: 133/77  Pulse: 93  Temp: 98.6 F (37 C)  TempSrc: Oral  Height: 5\' 9"  (1.753 m)  Weight: 164 lb (74.39 kg)  SpO2: 97%    GENERAL: The patient is a well-nourished male, in no acute distress. The vital signs are documented above. CARDIAC: There is a regular rate and rhythm.  VASCULAR: I cannot palpate a radial pulse of the left wrist. He does have a monophasic radial and  ulnar signal with the Doppler. I cannot obtain a palmar arch signal. PULMONARY: There is good air exchange bilaterally without wheezing or rales. ABDOMEN: Soft and non-tender with normal pitched bowel sounds.  MUSCULOSKELETAL: He has a left below the knee amputation. NEUROLOGIC: No focal weakness or paresthesias are detected. SKIN: he has an eschar that is partially circumferential involving the distal interphalangeal joint of the small finger and also a small wound over the distal interphalangeal joint of the long finger. PSYCHIATRIC: The patient has a normal affect.  DATA:   UPPER EXTREMITY ARTERIAL DOPPLER: I have independently interpreted his upper extremity arterial Doppler study. The patient has brisk but monophasic Doppler signals in the radial and ulnar positions in the left upper extremity. Signal cannot be obtained in the digits on the left at rest or with compression of the fistula.  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 He has no signals in the digits even with compression of his fistula and I do not think that a DRIL procedure would be adequate for healing any  surgery that he might require on his digits of the left hand. Even that he has a right axillofemoral bypass graft is tunneled dialysis catheter will have to be placed on the left side. We will have to reconsider his access options once the hand issues are resolved. Given his axillofemoral bypass graft on the right he might need to be considered for thigh graft on the left where he has a below-the-knee amputation. He does not have a prosthesis. His surgery is scheduled for Monday with Dr. Donnetta Hutching.  Tomorrow's schedule is full and he has had the wounds since August. Discussed the procedure and potential complications and he is agreeable to proceed on  Monday.   Deitra Mayo [facility] [place]: [phone]

## 2015-08-27 NOTE — Op Note (Signed)
    OPERATIVE REPORT  DATE OF SURGERY: 08/27/2015  PATIENT: John [name], 60 y.o. male MRN: [id number]  DOB: 10-29-1955  PRE-OPERATIVE DIAGNOSIS: End-stage renal disease with progressive ischemia left hand and patent left upper arm AV fistula  POST-OPERATIVE DIAGNOSIS:  Same  PROCEDURE: #1 right IJ hemodialysis catheter, #2 ligation of left upper arm AV fistula  SURGEON:  Curt Jews, M.D.  PHYSICIAN ASSISTANT: Nurse  ANESTHESIA:  Local with sedation  EBL: Minimal ml  Total I/O In: 300 [I.V.:300] Out: 10 [Blood:10]  BLOOD ADMINISTERED: None  DRAINS: None  SPECIMEN: None  COUNTS CORRECT:  YES  PLAN OF CARE: PACU   PATIENT DISPOSITION:  PACU - hemodynamically stable  PROCEDURE DETAILS: The patient 60 year old gentleman who is been on hemodialysis for approximately one year. His only access has been his left upper arm AV fistula. He has never had hemodialysis via a catheter. He has severe peripheral vascular occlusive disease. He has undergone prior left carotid endarterectomy, right axillofemoral and right femoropopliteal bypass. All of these were by Dr. Scot Dock. He isn't failed to return for follow-up of these procedures. He has had ischemic ulcers of his third and fifth finger of his left hand with severe pain associated with this. Doppler study showed no flow in his hand. He did not have any augmentation with occlusion of his fistula. On physical exam he has a completely calcified radial artery at the wrist. Was recommended he undergo ligation of his fistula for possible improvement of flow to his left hand. He is being hand followed by hand surgery as well.  Patient was taken And placed supine position where the area of the right left neck prepped and draped in usual sterile fashion. SonoSite ultrasound revealed small jugular vein on the left around the level of the prior endarterectomy. On the right he had a widely patent jugular vein. The patient was placed in  Trendelenburg position and using local anesthesia and SonoSite visualization the right internal jugular vein was accessed. A guidewire was passed down the level right atrium and this was confirmed with fluoroscopy. A dilator and peel-away sheath was passed over the guidewire and the dilator and guidewire removed. A 24 cm hemodialysis catheter was passed through the peel-away sheath and the peel-away sheath was removed. The catheter was placed in the distal right atrium. Catheter was brought through subcutaneous tunnel through a separate stab incision. 2 lm were poor attached in both flushed and aspirated easily and were locked with 1000 unit per cc heparin.  Attention was then turned to the left arm. The left arm prepped draped in usual sterile fashion. Using local anesthesia incision was made to the prior brachial artery to cephalic vein anastomosis. The cephalic vein was isolated at the brachial artery and was doubly ligated with 2-0 silk ties. Wounds irrigated with saline. Hemostasis tablet cautery. The wounds were closed with 3-0 Vicryl in the subcutaneous and subcuticular tissue.  On physical exam the left femoral artery was completely calcified and had no pulse. I do not feel that he has any options other than long-term catheter use for hemodialysis.   Curt Jews, M.D. 08/27/2015 10:46 AM

## 2015-08-27 NOTE — OR Nursing (Signed)
Patient preop assessment performed by Gladstone Lighter RN

## 2015-08-27 NOTE — Progress Notes (Signed)
Peripheral IV removed from right forearm catheter intact

## 2015-08-27 NOTE — Anesthesia Postprocedure Evaluation (Signed)
Anesthesia Post Note  Patient: John [name]  Procedure(s) Performed: Procedure(s) (LRB): LIGATION OF Left Arm ARTERIOVENOUS  FISTULA (Left) INSERTION OF Right Internal Jugular DIALYSIS CATHETER (Left)  Patient location during evaluation: PACU Anesthesia Type: MAC Level of consciousness: awake and alert Pain management: pain level controlled Vital Signs Assessment: post-procedure vital signs reviewed and stable Respiratory status: spontaneous breathing, nonlabored ventilation, respiratory function stable and patient connected to nasal cannula oxygen Cardiovascular status: stable and blood pressure returned to baseline Anesthetic complications: no    Last Vitals:  Filed Vitals:   08/27/15 1130 08/27/15 1152  BP:  166/76  Pulse: 78 76  Temp:    Resp: 17     Last Pain:  Filed Vitals:   08/27/15 1153  PainSc: 3                  Catalina Gravel

## 2015-08-27 NOTE — Anesthesia Preprocedure Evaluation (Addendum)
Anesthesia Evaluation  Patient identified by MRN, date of birth, ID band Patient awake    Reviewed: Allergy & Precautions, H&P , NPO status , Patient's Chart, lab work & pertinent test results  History of Anesthesia Complications Negative for: history of anesthetic complications  Airway Mallampati: II  TM Distance: >3 FB Neck ROM: Full    Dental  (+) Edentulous Upper, Poor Dentition, Dental Advisory Given, Missing   Pulmonary asthma , COPD,  COPD inhaler, Current Smoker,    Pulmonary exam normal breath sounds clear to auscultation       Cardiovascular hypertension, Pt. on medications + Peripheral Vascular Disease  + Valvular Problems/Murmurs MR  Rhythm:Regular Rate:Normal  '14 ECHO: Study Conclusions  - Left ventricle: The cavity size was normal. There was mildconcentric hypertrophy. Systolic function was mildly reduced. The estimated ejection fraction was in the rangeof 50% to 55%. Although no diagnostic regional wall motionabnormality was identified, this possibility cannot becompletely excluded on the basis of this study. Featuresare consistent with a pseudonormal left ventricularfilling pattern, with concomitant abnormal relaxation andincreased filling pressure (grade 2 diastolic dysfunction). Doppler parameters are consistent with elevated mean left atrial filling pressure. - Mitral valve: Moderate to severe regurgitation directed centrally. - Left atrium: The atrium was moderately to severely dilated. - Atrial septum: No defect or patent foramen ovale was identified. - Pulmonary arteries: Systolic pressure was moderately to severely increased. PA peak pressure: 54mm Hg (S). - Pericardium, extracardiac: A small pericardial effusion was identified circumferential to the heart.  2014: right axillofemoral bypass and right femoropopliteal bypass CEA   Neuro/Psych TIA Neuromuscular disease negative  psych ROS   GI/Hepatic negative GI ROS, Neg liver ROS,   Endo/Other  diabetes, Well Controlled, Type 2, Insulin Dependent  Renal/GU Dialysis and ESRFRenal disease (Tues/Thurs/Sat)Last dialysis 08/25/15; K+ 4.0     Musculoskeletal  (+) Arthritis , Osteoarthritis,    Abdominal   Peds  Hematology  (+) Blood dyscrasia, anemia ,   Anesthesia Other Findings Day of surgery medications reviewed with the patient.  Reproductive/Obstetrics                         Anesthesia Physical Anesthesia Plan  ASA: III  Anesthesia Plan: MAC   Post-op Pain Management:    Induction: Intravenous  Airway Management Planned: Nasal Cannula  Additional Equipment:   Intra-op Plan:   Post-operative Plan:   Informed Consent: I have reviewed the patients History and Physical, chart, labs and discussed the procedure including the risks, benefits and alternatives for the proposed anesthesia with the patient or authorized representative who has indicated his/her understanding and acceptance.   Dental advisory given  Plan Discussed with: CRNA and Anesthesiologist  Anesthesia Plan Comments: (Discussed risks/benefits/alternatives to MAC sedation including need for ventilatory support, hypotension, need for conversion to general anesthesia.  All patient questions answered.  Patient wished to proceed.)        Anesthesia Quick Evaluation

## 2015-08-27 NOTE — Transfer of Care (Signed)
Immediate Anesthesia Transfer of Care Note  Patient: John [name]  Procedure(s) Performed: Procedure(s): LIGATION OF Left Arm ARTERIOVENOUS  FISTULA (Left) INSERTION OF Right Internal Jugular DIALYSIS CATHETER (Left)  Patient Location: PACU  Anesthesia Type:General  Level of Consciousness: awake, alert , oriented and patient cooperative  Airway & Oxygen Therapy: Patient Spontanous Breathing and Patient connected to nasal cannula oxygen  Post-op Assessment: Report given to RN, Post -op Vital signs reviewed and stable and Patient moving all extremities X 4  Post vital signs: Reviewed and stable  Last Vitals:  Filed Vitals:   08/27/15 0849  BP: 172/73  Pulse: 75  Temp: 37.2 C  Resp: 21    Complications: No apparent anesthesia complications

## 2015-08-27 NOTE — Interval H&P Note (Signed)
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 After consideration of risks, benefits and other options for treatment, the patient has consented to  Procedure(s): LIGATION OF ARTERIOVENOUS  FISTULA (Left) INSERTION OF DIALYSIS CATHETER (Left) as a surgical intervention .  The patient's history has been reviewed, patient examined, no change in status, stable for surgery.  I have reviewed the patient's chart and labs.  Questions were answered to the patient's satisfaction.     Curt Jews

## 2015-08-27 NOTE — Anesthesia Procedure Notes (Signed)
Procedure Name: MAC Date/Time: 08/27/2015 9:39 AM Performed by: Lance Coon Pre-anesthesia Checklist: Patient identified, Timeout performed, Emergency Drugs available, Suction available and Patient being monitored Patient Re-evaluated:Patient Re-evaluated prior to inductionOxygen Delivery Method: Nasal cannula

## 2015-08-28 ENCOUNTER — Encounter (HOSPITAL_COMMUNITY): Payer: Self-pay | Admitting: Vascular Surgery

## 2015-08-28 DIAGNOSIS — N186 End stage renal disease: Secondary | ICD-10-CM | POA: Diagnosis not present

## 2015-08-28 DIAGNOSIS — R197 Diarrhea, unspecified: Secondary | ICD-10-CM | POA: Diagnosis not present

## 2015-08-28 DIAGNOSIS — D631 Anemia in chronic kidney disease: Secondary | ICD-10-CM | POA: Diagnosis not present

## 2015-08-28 DIAGNOSIS — D688 Other specified coagulation defects: Secondary | ICD-10-CM | POA: Diagnosis not present

## 2015-08-28 DIAGNOSIS — R52 Pain, unspecified: Secondary | ICD-10-CM | POA: Diagnosis not present

## 2015-08-28 DIAGNOSIS — N2581 Secondary hyperparathyroidism of renal origin: Secondary | ICD-10-CM | POA: Diagnosis not present

## 2015-08-28 DIAGNOSIS — T8249XA Other complication of vascular dialysis catheter, initial encounter: Secondary | ICD-10-CM | POA: Diagnosis not present

## 2015-08-28 DIAGNOSIS — R51 Headache: Secondary | ICD-10-CM | POA: Diagnosis not present

## 2015-08-28 DIAGNOSIS — D689 Coagulation defect, unspecified: Secondary | ICD-10-CM | POA: Diagnosis not present

## 2015-08-28 DIAGNOSIS — D509 Iron deficiency anemia, unspecified: Secondary | ICD-10-CM | POA: Diagnosis not present

## 2015-08-28 DIAGNOSIS — E119 Type 2 diabetes mellitus without complications: Secondary | ICD-10-CM | POA: Diagnosis not present

## 2015-08-29 ENCOUNTER — Telehealth: Payer: Self-pay | Admitting: Vascular Surgery

## 2015-08-29 DIAGNOSIS — D649 Anemia, unspecified: Secondary | ICD-10-CM | POA: Diagnosis not present

## 2015-08-29 DIAGNOSIS — I120 Hypertensive chronic kidney disease with stage 5 chronic kidney disease or end stage renal disease: Secondary | ICD-10-CM | POA: Diagnosis not present

## 2015-08-29 DIAGNOSIS — Z4781 Encounter for orthopedic aftercare following surgical amputation: Secondary | ICD-10-CM | POA: Diagnosis not present

## 2015-08-29 DIAGNOSIS — E1151 Type 2 diabetes mellitus with diabetic peripheral angiopathy without gangrene: Secondary | ICD-10-CM | POA: Diagnosis not present

## 2015-08-29 DIAGNOSIS — E1140 Type 2 diabetes mellitus with diabetic neuropathy, unspecified: Secondary | ICD-10-CM | POA: Diagnosis not present

## 2015-08-29 DIAGNOSIS — Z794 Long term (current) use of insulin: Secondary | ICD-10-CM | POA: Diagnosis not present

## 2015-08-29 DIAGNOSIS — J449 Chronic obstructive pulmonary disease, unspecified: Secondary | ICD-10-CM | POA: Diagnosis not present

## 2015-08-29 DIAGNOSIS — Z992 Dependence on renal dialysis: Secondary | ICD-10-CM | POA: Diagnosis not present

## 2015-08-29 DIAGNOSIS — Z7982 Long term (current) use of aspirin: Secondary | ICD-10-CM | POA: Diagnosis not present

## 2015-08-29 DIAGNOSIS — Z89512 Acquired absence of left leg below knee: Secondary | ICD-10-CM | POA: Diagnosis not present

## 2015-08-29 DIAGNOSIS — G8929 Other chronic pain: Secondary | ICD-10-CM | POA: Diagnosis not present

## 2015-08-29 DIAGNOSIS — E785 Hyperlipidemia, unspecified: Secondary | ICD-10-CM | POA: Diagnosis not present

## 2015-08-29 DIAGNOSIS — E1122 Type 2 diabetes mellitus with diabetic chronic kidney disease: Secondary | ICD-10-CM | POA: Diagnosis not present

## 2015-08-29 DIAGNOSIS — N186 End stage renal disease: Secondary | ICD-10-CM | POA: Diagnosis not present

## 2015-08-29 NOTE — Telephone Encounter (Signed)
Spoke with pt to schedule, dpm °

## 2015-08-29 NOTE — Telephone Encounter (Signed)
-----   Message from Mena Goes, RN sent at 08/27/2015 11:09 AM EDT ----- Regarding: schedule   ----- Message -----    From: Gabriel Earing, PA-C    Sent: 08/27/2015  10:34 AM      To: [facility] Charge Pool  This pt is s/p fistula ligation and does not need f/u for this.  However, he has been lost to f/u with [facility].  Dr. Donnetta Hutching says this pt should f/u with [facility] in about 4 weeks with carotid duplex and arterial duplex of his ax/fem/fem graft.   Thanks, Aldona Bar

## 2015-08-30 DIAGNOSIS — R52 Pain, unspecified: Secondary | ICD-10-CM | POA: Diagnosis not present

## 2015-08-30 DIAGNOSIS — N186 End stage renal disease: Secondary | ICD-10-CM | POA: Diagnosis not present

## 2015-08-30 DIAGNOSIS — E119 Type 2 diabetes mellitus without complications: Secondary | ICD-10-CM | POA: Diagnosis not present

## 2015-08-30 DIAGNOSIS — D689 Coagulation defect, unspecified: Secondary | ICD-10-CM | POA: Diagnosis not present

## 2015-08-30 DIAGNOSIS — D631 Anemia in chronic kidney disease: Secondary | ICD-10-CM | POA: Diagnosis not present

## 2015-08-30 DIAGNOSIS — D509 Iron deficiency anemia, unspecified: Secondary | ICD-10-CM | POA: Diagnosis not present

## 2015-08-30 DIAGNOSIS — T8249XA Other complication of vascular dialysis catheter, initial encounter: Secondary | ICD-10-CM | POA: Diagnosis not present

## 2015-08-30 DIAGNOSIS — D688 Other specified coagulation defects: Secondary | ICD-10-CM | POA: Diagnosis not present

## 2015-08-30 DIAGNOSIS — R197 Diarrhea, unspecified: Secondary | ICD-10-CM | POA: Diagnosis not present

## 2015-08-30 DIAGNOSIS — R51 Headache: Secondary | ICD-10-CM | POA: Diagnosis not present

## 2015-08-30 DIAGNOSIS — N2581 Secondary hyperparathyroidism of renal origin: Secondary | ICD-10-CM | POA: Diagnosis not present

## 2015-08-31 ENCOUNTER — Other Ambulatory Visit (HOSPITAL_COMMUNITY): Payer: Medicare Other

## 2015-08-31 ENCOUNTER — Encounter: Payer: Medicare Other | Admitting: Vascular Surgery

## 2015-09-01 DIAGNOSIS — T8249XA Other complication of vascular dialysis catheter, initial encounter: Secondary | ICD-10-CM | POA: Diagnosis not present

## 2015-09-01 DIAGNOSIS — D689 Coagulation defect, unspecified: Secondary | ICD-10-CM | POA: Diagnosis not present

## 2015-09-01 DIAGNOSIS — R51 Headache: Secondary | ICD-10-CM | POA: Diagnosis not present

## 2015-09-01 DIAGNOSIS — E119 Type 2 diabetes mellitus without complications: Secondary | ICD-10-CM | POA: Diagnosis not present

## 2015-09-01 DIAGNOSIS — R52 Pain, unspecified: Secondary | ICD-10-CM | POA: Diagnosis not present

## 2015-09-01 DIAGNOSIS — D631 Anemia in chronic kidney disease: Secondary | ICD-10-CM | POA: Diagnosis not present

## 2015-09-01 DIAGNOSIS — D509 Iron deficiency anemia, unspecified: Secondary | ICD-10-CM | POA: Diagnosis not present

## 2015-09-01 DIAGNOSIS — N2581 Secondary hyperparathyroidism of renal origin: Secondary | ICD-10-CM | POA: Diagnosis not present

## 2015-09-01 DIAGNOSIS — D688 Other specified coagulation defects: Secondary | ICD-10-CM | POA: Diagnosis not present

## 2015-09-01 DIAGNOSIS — N186 End stage renal disease: Secondary | ICD-10-CM | POA: Diagnosis not present

## 2015-09-01 DIAGNOSIS — R197 Diarrhea, unspecified: Secondary | ICD-10-CM | POA: Diagnosis not present

## 2015-09-04 DIAGNOSIS — N186 End stage renal disease: Secondary | ICD-10-CM | POA: Diagnosis not present

## 2015-09-04 DIAGNOSIS — D689 Coagulation defect, unspecified: Secondary | ICD-10-CM | POA: Diagnosis not present

## 2015-09-04 DIAGNOSIS — E119 Type 2 diabetes mellitus without complications: Secondary | ICD-10-CM | POA: Diagnosis not present

## 2015-09-04 DIAGNOSIS — R51 Headache: Secondary | ICD-10-CM | POA: Diagnosis not present

## 2015-09-04 DIAGNOSIS — R197 Diarrhea, unspecified: Secondary | ICD-10-CM | POA: Diagnosis not present

## 2015-09-04 DIAGNOSIS — D631 Anemia in chronic kidney disease: Secondary | ICD-10-CM | POA: Diagnosis not present

## 2015-09-04 DIAGNOSIS — D688 Other specified coagulation defects: Secondary | ICD-10-CM | POA: Diagnosis not present

## 2015-09-04 DIAGNOSIS — R52 Pain, unspecified: Secondary | ICD-10-CM | POA: Diagnosis not present

## 2015-09-04 DIAGNOSIS — T8249XA Other complication of vascular dialysis catheter, initial encounter: Secondary | ICD-10-CM | POA: Diagnosis not present

## 2015-09-04 DIAGNOSIS — N2581 Secondary hyperparathyroidism of renal origin: Secondary | ICD-10-CM | POA: Diagnosis not present

## 2015-09-04 DIAGNOSIS — D509 Iron deficiency anemia, unspecified: Secondary | ICD-10-CM | POA: Diagnosis not present

## 2015-09-05 ENCOUNTER — Encounter: Payer: Self-pay | Admitting: Physical Medicine & Rehabilitation

## 2015-09-05 ENCOUNTER — Encounter (HOSPITAL_BASED_OUTPATIENT_CLINIC_OR_DEPARTMENT_OTHER): Payer: Medicare Other | Admitting: Physical Medicine & Rehabilitation

## 2015-09-05 VITALS — BP 189/103 | HR 95 | Resp 14

## 2015-09-05 DIAGNOSIS — M79602 Pain in left arm: Secondary | ICD-10-CM

## 2015-09-05 DIAGNOSIS — Z794 Long term (current) use of insulin: Secondary | ICD-10-CM | POA: Diagnosis not present

## 2015-09-05 DIAGNOSIS — Z8673 Personal history of transient ischemic attack (TIA), and cerebral infarction without residual deficits: Secondary | ICD-10-CM | POA: Diagnosis not present

## 2015-09-05 DIAGNOSIS — Z992 Dependence on renal dialysis: Secondary | ICD-10-CM | POA: Diagnosis not present

## 2015-09-05 DIAGNOSIS — Z89512 Acquired absence of left leg below knee: Secondary | ICD-10-CM

## 2015-09-05 DIAGNOSIS — M25562 Pain in left knee: Secondary | ICD-10-CM

## 2015-09-05 DIAGNOSIS — Z4781 Encounter for orthopedic aftercare following surgical amputation: Secondary | ICD-10-CM | POA: Diagnosis not present

## 2015-09-05 DIAGNOSIS — J45909 Unspecified asthma, uncomplicated: Secondary | ICD-10-CM | POA: Diagnosis not present

## 2015-09-05 DIAGNOSIS — I120 Hypertensive chronic kidney disease with stage 5 chronic kidney disease or end stage renal disease: Secondary | ICD-10-CM | POA: Diagnosis not present

## 2015-09-05 DIAGNOSIS — R269 Unspecified abnormalities of gait and mobility: Secondary | ICD-10-CM | POA: Diagnosis not present

## 2015-09-05 DIAGNOSIS — D649 Anemia, unspecified: Secondary | ICD-10-CM | POA: Diagnosis not present

## 2015-09-05 DIAGNOSIS — E1140 Type 2 diabetes mellitus with diabetic neuropathy, unspecified: Secondary | ICD-10-CM | POA: Diagnosis not present

## 2015-09-05 DIAGNOSIS — E1142 Type 2 diabetes mellitus with diabetic polyneuropathy: Secondary | ICD-10-CM | POA: Diagnosis not present

## 2015-09-05 DIAGNOSIS — Z7982 Long term (current) use of aspirin: Secondary | ICD-10-CM | POA: Diagnosis not present

## 2015-09-05 DIAGNOSIS — N186 End stage renal disease: Secondary | ICD-10-CM | POA: Diagnosis not present

## 2015-09-05 DIAGNOSIS — E1122 Type 2 diabetes mellitus with diabetic chronic kidney disease: Secondary | ICD-10-CM | POA: Diagnosis not present

## 2015-09-05 DIAGNOSIS — I739 Peripheral vascular disease, unspecified: Secondary | ICD-10-CM | POA: Diagnosis not present

## 2015-09-05 DIAGNOSIS — L97922 Non-pressure chronic ulcer of unspecified part of left lower leg with fat layer exposed: Secondary | ICD-10-CM | POA: Diagnosis not present

## 2015-09-05 DIAGNOSIS — E785 Hyperlipidemia, unspecified: Secondary | ICD-10-CM | POA: Diagnosis not present

## 2015-09-05 DIAGNOSIS — J449 Chronic obstructive pulmonary disease, unspecified: Secondary | ICD-10-CM | POA: Diagnosis not present

## 2015-09-05 DIAGNOSIS — E1165 Type 2 diabetes mellitus with hyperglycemia: Secondary | ICD-10-CM | POA: Diagnosis not present

## 2015-09-05 DIAGNOSIS — I10 Essential (primary) hypertension: Secondary | ICD-10-CM

## 2015-09-05 DIAGNOSIS — E1151 Type 2 diabetes mellitus with diabetic peripheral angiopathy without gangrene: Secondary | ICD-10-CM | POA: Diagnosis not present

## 2015-09-05 DIAGNOSIS — G8929 Other chronic pain: Secondary | ICD-10-CM | POA: Diagnosis not present

## 2015-09-05 MED ORDER — CEPHALEXIN 500 MG PO CAPS: 500.0000 mg | ORAL_CAPSULE | Freq: Every day | ORAL | Status: AC

## 2015-09-05 NOTE — Patient Instructions (Signed)
Ensure follow with Amputation surgeon Ensure follow with wound care

## 2015-09-05 NOTE — Progress Notes (Addendum)
Subjective:    Patient ID: John [name], male    DOB: Dec 29, 1955, 60 y.o.   MRN: [id number]  HPI 60 year old male with history of COPD, chronic pian, ESRD, DM type 2 with peripheral neuropathy, SCI with ACDF, PAD with ongoing tobacco use presents for follow up appointment for his  L-BKA on  07/09/15.  He was last seen in clinic on 08/08/15.  At that time a referral was made to the wound clinic for ulcers around his BKA site. Santyl was also ordered. He is encouraged to avoid pressure to the posterior knee. He had also mentioned that he was going to have an appointment soon to discuss possible fistula reversal. He states he never went to wound clinic.  He states he has been compliant with santly.  He has been avoiding pressure to the posterior leg.  He states he had his fistula reversal.  He is schedules to see Vascular on Friday. He states he has to schedule an appointment for his BKA.   Receives pain meds from Dr [name]  Pain Inventory Average Pain 7 Pain Right Now 9 My pain is sharp, stabbing and aching  In the last 24 hours, has pain interfered with the following? General activity 8 Relation with others 9 Enjoyment of life 9 What TIME of day is your pain at its worst? morning, evening.  Sleep (in general) Fair  Pain is worse with: bending and some activites Pain improves with: rest and medication Relief from Meds: 9  Mobility ability to climb steps?  no do you drive?  yes use a wheelchair transfers alone  Function disabled: date disabled NA I need assistance with the following:  household duties Do you have any goals in this area?  yes  Neuro/Psych No problems in this area  Prior Studies Any changes since last visit?  no  Physicians involved in your care Any changes since last visit?  no   Family History  Problem Relation Age of Onset  . Diabetes Mother   . Heart disease Mother     before age 42  . Hypertension Mother   . Heart attack Mother   . Diabetes  Father     Amputation  . Heart disease Father     Before age 71  . Hypertension Father   . Heart attack Father   . Other Father     bleeding problems  . Diabetes Brother   . Heart disease Brother     Before age 32  . Hypertension Brother   . Heart attack Brother    Social History   Social History  . Marital Status: Married    Spouse Name: [name]  . Number of Children: 4  . Years of Education: 11   Occupational History  . 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 Smoking status: Current Every Day Smoker -- 0.20 packs/day for 40 years    Types: Cigarettes    Last Attempt to Quit: 01/30/2014  . Smokeless tobacco: Never Used     Comment: 2-3 cigarettes a day  . Alcohol Use: No     Comment: Previous drinker - However, quit in 2006  . Drug Use: No  . Sexual Activity: No   Other Topics Concern  . None   Social History Narrative   Health Care POA:    Emergency Contact: wife, Tavis Vankleeck [phone]   End of Life Plan: gave patient AD pamphlet.   Who lives with you: wife  Any pets: 1 dog   Diet: Pt has a varied diet of protein, starch and vegetables.   Exercise: Pt reports walking around yard.    Seatbelts: Pt reports wearing seatbelt when in vehicles.    Hobbies: fishing               Past Surgical History  Procedure Laterality Date  . Neck surgery      "Titatium Plate"  . Eye surgery    . Cardiac catheterization    . Endarterectomy  12/18/2011    Procedure: ENDARTERECTOMY CAROTID;  Surgeon: Angelia Mould, MD;  Location: [place];  Service: Vascular;  Laterality: Left;  . Carotid endarterectomy Right   . Axillary-femoral bypass graft  06/18/2012    Procedure: BYPASS GRAFT AXILLA-BIFEMORAL;  Surgeon: Angelia Mould, MD;  Location: [place];  Service: Vascular;  Laterality: Right;  Right Axillo to Right Femoral Bypass Graft  . Femoral-popliteal bypass graft  06/18/2012    Procedure: BYPASS GRAFT FEMORAL-POPLITEAL ARTERY;  Surgeon: Angelia Mould, MD;  Location: [facility] OR;  Service: Vascular;  Laterality: Right;  Right Femoral to Above Knee Popliteal Bypass Graft  . Amputation  06/18/2012    Procedure: AMPUTATION DIGIT;  Surgeon: Angelia Mould, MD;  Location: [facility] OR;  Service: Vascular;  Laterality: Right;  Right side, amputation of fourth and fifth toes  . Av fistula placement  06/24/2012    Procedure: ARTERIOVENOUS (AV) FISTULA CREATION;  Surgeon: Angelia Mould, MD;  Location: [place];  Service: Vascular;  Laterality: Left;  . Lower extremity angiogram Right 11/21/2011    Procedure: LOWER EXTREMITY ANGIOGRAM;  Surgeon: Elam Dutch, MD;  Location: [facility] CATH LAB;  Service: Cardiovascular;  Laterality: Right;  . Enucleation Left     trauma as a child. Wears prosthesis.   Marland Kitchen Below knee leg amputation Left     [facility] - Dr. Sabino Dick  . Amputation toe Left 2016    2 toes  . Transmetatarsal amputation Left 2016  . Ligation of arteriovenous  fistula Left 08/27/2015    Procedure: LIGATION OF Left Arm ARTERIOVENOUS  FISTULA;  Surgeon: Rosetta Posner, MD;  Location: [place];  Service: Vascular;  Laterality: Left;  . Insertion of dialysis catheter Left 08/27/2015    Procedure: INSERTION OF Right Internal Jugular DIALYSIS CATHETER;  Surgeon: Rosetta Posner, MD;  Location: [place];  Service: Vascular;  Laterality: Left;   Past Medical History  Diagnosis Date  . COPD (chronic obstructive pulmonary disease) ([place])   . Chronic back pain     Chronic narcotics for this for many years  . Hyperlipidemia   . Foot pain   . Peripheral vascular disease ([place])   . Asthma   . Ulcer 1985  . Fracture of foot     [place]- Right  . Neuromuscular disorder ([place])     periferal neuropathy  . Chronic kidney disease     CKD stage III  . Hypertension     not taking any medication  . Stroke ([place])     No residual "Mini strokes"  . Diabetes mellitus     Type II  . Arthritis    BP 189/103 mmHg  Pulse 95  Resp 14  SpO2 95%  Opioid Risk  Score:   Fall Risk Score:  `1  Depression screen PHQ 2/9  Depression screen [facility] 2/9 08/22/2015 08/09/2015 08/08/2015 07/30/2015 06/13/2015 12/06/2014 10/18/2014  Decreased Interest 0 0 0 0 0 0 0  Down, Depressed, Hopeless  0 0 0 0 0 0 0  PHQ - 2 Score 0 0 0 0 0 0 0  Altered sleeping - - 0 - - - -  Tired, decreased energy - - 1 - - - -  Change in appetite - - 1 - - - -  Feeling bad or failure about yourself  - - 0 - - - -  Trouble concentrating - - 0 - - - -  Moving slowly or fidgety/restless - - 0 - - - -  Suicidal thoughts - - 0 - - - -  PHQ-9 Score - - 2 - - - -   Review of Systems  Constitutional: Positive for diaphoresis.  Gastrointestinal: Positive for constipation.  Endocrine:       High blood sugar Low blood sugar  All other systems reviewed and are negative.     Objective:   Physical Exam Constitutional: He appears well-developed and well-nourished.  NAD HENT:  Atraumatic.  Normocephalic. Mouth/Throat: Poor dentition   Eyes: Lids are normal. Left eye exhibits abnormal extraocular motion. 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    Cardiovascular: Normal rate and regular rhythm.  Murmur heard. Respiratory: Effort normal and breath sounds normal. No respiratory distress. He exhibits no tenderness.   GI: Soft. Bowel sounds are normal. He exhibits no distension. There is no tenderness. There is no guarding.  Musculoskeletal: He exhibits no tenderness. LBKA without edema without sutures. Able to extend leg to about 10 deg.  Fingers b/l atrophic and ischemic appearing on left, getting worse, particularly on 5th digit.  Left knee with DTI on patella, now open.  Knee with arthritic changes and tight hamstrings.  Neurological: He is alert and oriented. Right exopthalmic.  He was able to follow simple motor commands without difficulty.   Motor: RUE: 5/5 LUE 4+/5 (pain inhibition) LLE: hip flexion 4+/5 RLE: 4+/5 proximal to distal   Skin: He is not diaphoretic.   Open wound on left  patella Open wound popliteal fossa with tendon exposure Ischemic changes to left hand Psychiatric: His speech is normal. Mood/affect normal.     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  Left BKA with pain and wound             Cont Therapies             Avoid pressure to anterior and posterior knee, avoid bracing and excessive compressoin             Will refer again to wound care             Cont santly for popliteal wound  Will order Keflex  Pt to schedule follow up appointment at [facility] [place] with surgeon  2. Abnormality of gait             Cont therapies             Cont wheelchair  3. Ischemic changes left hand             Pt had fistula reversal, has appointment to see physician 3/31  4. Left popliteal wound  Now with tendon exposure  Avoid pressure  Pt to follow up with Vascular regarding blood flow to LLE  5. Essential HTN  Cont meds  Follow up with PCP/Nephro

## 2015-09-06 ENCOUNTER — Other Ambulatory Visit: Payer: Self-pay | Admitting: Nephrology

## 2015-09-06 DIAGNOSIS — R51 Headache: Secondary | ICD-10-CM | POA: Diagnosis not present

## 2015-09-06 DIAGNOSIS — E119 Type 2 diabetes mellitus without complications: Secondary | ICD-10-CM | POA: Diagnosis not present

## 2015-09-06 DIAGNOSIS — D509 Iron deficiency anemia, unspecified: Secondary | ICD-10-CM | POA: Diagnosis not present

## 2015-09-06 DIAGNOSIS — N186 End stage renal disease: Secondary | ICD-10-CM | POA: Diagnosis not present

## 2015-09-06 DIAGNOSIS — D689 Coagulation defect, unspecified: Secondary | ICD-10-CM | POA: Diagnosis not present

## 2015-09-06 DIAGNOSIS — N2581 Secondary hyperparathyroidism of renal origin: Secondary | ICD-10-CM | POA: Diagnosis not present

## 2015-09-06 DIAGNOSIS — D688 Other specified coagulation defects: Secondary | ICD-10-CM | POA: Diagnosis not present

## 2015-09-06 DIAGNOSIS — R52 Pain, unspecified: Secondary | ICD-10-CM | POA: Diagnosis not present

## 2015-09-06 DIAGNOSIS — R911 Solitary pulmonary nodule: Secondary | ICD-10-CM

## 2015-09-06 DIAGNOSIS — D631 Anemia in chronic kidney disease: Secondary | ICD-10-CM | POA: Diagnosis not present

## 2015-09-06 DIAGNOSIS — R197 Diarrhea, unspecified: Secondary | ICD-10-CM | POA: Diagnosis not present

## 2015-09-06 DIAGNOSIS — T8249XA Other complication of vascular dialysis catheter, initial encounter: Secondary | ICD-10-CM | POA: Diagnosis not present

## 2015-09-07 DIAGNOSIS — G5602 Carpal tunnel syndrome, left upper limb: Secondary | ICD-10-CM | POA: Diagnosis not present

## 2015-09-07 DIAGNOSIS — S41102D Unspecified open wound of left upper arm, subsequent encounter: Secondary | ICD-10-CM | POA: Diagnosis not present

## 2015-09-07 DIAGNOSIS — E1129 Type 2 diabetes mellitus with other diabetic kidney complication: Secondary | ICD-10-CM | POA: Diagnosis not present

## 2015-09-07 DIAGNOSIS — I96 Gangrene, not elsewhere classified: Secondary | ICD-10-CM | POA: Diagnosis not present

## 2015-09-07 DIAGNOSIS — Z992 Dependence on renal dialysis: Secondary | ICD-10-CM | POA: Diagnosis not present

## 2015-09-07 DIAGNOSIS — N186 End stage renal disease: Secondary | ICD-10-CM | POA: Diagnosis not present

## 2015-09-08 DIAGNOSIS — E119 Type 2 diabetes mellitus without complications: Secondary | ICD-10-CM | POA: Diagnosis not present

## 2015-09-08 DIAGNOSIS — Z992 Dependence on renal dialysis: Secondary | ICD-10-CM | POA: Diagnosis not present

## 2015-09-08 DIAGNOSIS — N186 End stage renal disease: Secondary | ICD-10-CM | POA: Diagnosis not present

## 2015-09-08 DIAGNOSIS — D689 Coagulation defect, unspecified: Secondary | ICD-10-CM | POA: Diagnosis not present

## 2015-09-08 DIAGNOSIS — E1129 Type 2 diabetes mellitus with other diabetic kidney complication: Secondary | ICD-10-CM | POA: Diagnosis not present

## 2015-09-08 DIAGNOSIS — T8249XA Other complication of vascular dialysis catheter, initial encounter: Secondary | ICD-10-CM | POA: Diagnosis not present

## 2015-09-08 DIAGNOSIS — D688 Other specified coagulation defects: Secondary | ICD-10-CM | POA: Diagnosis not present

## 2015-09-10 ENCOUNTER — Ambulatory Visit
Admission: RE | Admit: 2015-09-10 | Discharge: 2015-09-10 | Disposition: A | Discharge status: Home / Self Care | Payer: Medicare Other | Source: Ambulatory Visit | Attending: Nephrology | Admitting: Nephrology

## 2015-09-10 DIAGNOSIS — T82858D Stenosis of vascular prosthetic devices, implants and grafts, subsequent encounter: Secondary | ICD-10-CM | POA: Diagnosis not present

## 2015-09-10 DIAGNOSIS — Z992 Dependence on renal dialysis: Secondary | ICD-10-CM | POA: Diagnosis not present

## 2015-09-10 DIAGNOSIS — N186 End stage renal disease: Secondary | ICD-10-CM | POA: Diagnosis not present

## 2015-09-10 DIAGNOSIS — R911 Solitary pulmonary nodule: Secondary | ICD-10-CM | POA: Diagnosis not present

## 2015-09-10 DIAGNOSIS — I871 Compression of vein: Secondary | ICD-10-CM | POA: Diagnosis not present

## 2015-09-11 ENCOUNTER — Inpatient Hospital Stay (HOSPITAL_COMMUNITY)
Admission: AD | Admit: 2015-09-11 | Discharge: 2015-10-08 | DRG: 100 | Disposition: E | Payer: Medicare Other | Source: Ambulatory Visit | Attending: Family Medicine | Admitting: Family Medicine

## 2015-09-11 ENCOUNTER — Encounter: Payer: Self-pay | Admitting: Family Medicine

## 2015-09-11 ENCOUNTER — Ambulatory Visit (INDEPENDENT_AMBULATORY_CARE_PROVIDER_SITE_OTHER): Payer: Medicare Other | Admitting: Family Medicine

## 2015-09-11 ENCOUNTER — Inpatient Hospital Stay (HOSPITAL_COMMUNITY): Payer: Medicare Other

## 2015-09-11 ENCOUNTER — Encounter (HOSPITAL_COMMUNITY): Payer: Self-pay | Admitting: General Practice

## 2015-09-11 VITALS — BP 90/50 | HR 98 | Temp 98.5°F

## 2015-09-11 DIAGNOSIS — E1122 Type 2 diabetes mellitus with diabetic chronic kidney disease: Secondary | ICD-10-CM | POA: Diagnosis present

## 2015-09-11 DIAGNOSIS — T8249XA Other complication of vascular dialysis catheter, initial encounter: Secondary | ICD-10-CM | POA: Diagnosis not present

## 2015-09-11 DIAGNOSIS — R402143 Coma scale, eyes open, spontaneous, at hospital admission: Secondary | ICD-10-CM | POA: Diagnosis present

## 2015-09-11 DIAGNOSIS — Z79891 Long term (current) use of opiate analgesic: Secondary | ICD-10-CM

## 2015-09-11 DIAGNOSIS — J9601 Acute respiratory failure with hypoxia: Secondary | ICD-10-CM

## 2015-09-11 DIAGNOSIS — I639 Cerebral infarction, unspecified: Secondary | ICD-10-CM | POA: Diagnosis not present

## 2015-09-11 DIAGNOSIS — Z885 Allergy status to narcotic agent status: Secondary | ICD-10-CM

## 2015-09-11 DIAGNOSIS — R402213 Coma scale, best verbal response, none, at hospital admission: Secondary | ICD-10-CM | POA: Diagnosis not present

## 2015-09-11 DIAGNOSIS — D688 Other specified coagulation defects: Secondary | ICD-10-CM | POA: Diagnosis not present

## 2015-09-11 DIAGNOSIS — I120 Hypertensive chronic kidney disease with stage 5 chronic kidney disease or end stage renal disease: Secondary | ICD-10-CM | POA: Diagnosis present

## 2015-09-11 DIAGNOSIS — Z794 Long term (current) use of insulin: Secondary | ICD-10-CM

## 2015-09-11 DIAGNOSIS — D1803 Hemangioma of intra-abdominal structures: Secondary | ICD-10-CM | POA: Diagnosis present

## 2015-09-11 DIAGNOSIS — G40909 Epilepsy, unspecified, not intractable, without status epilepticus: Secondary | ICD-10-CM | POA: Diagnosis not present

## 2015-09-11 DIAGNOSIS — E876 Hypokalemia: Secondary | ICD-10-CM | POA: Diagnosis present

## 2015-09-11 DIAGNOSIS — R402343 Coma scale, best motor response, flexion withdrawal, at hospital admission: Secondary | ICD-10-CM | POA: Diagnosis not present

## 2015-09-11 DIAGNOSIS — Z4682 Encounter for fitting and adjustment of non-vascular catheter: Secondary | ICD-10-CM | POA: Diagnosis not present

## 2015-09-11 DIAGNOSIS — E1129 Type 2 diabetes mellitus with other diabetic kidney complication: Secondary | ICD-10-CM | POA: Diagnosis not present

## 2015-09-11 DIAGNOSIS — G9340 Encephalopathy, unspecified: Secondary | ICD-10-CM | POA: Diagnosis not present

## 2015-09-11 DIAGNOSIS — J9602 Acute respiratory failure with hypercapnia: Secondary | ICD-10-CM | POA: Diagnosis not present

## 2015-09-11 DIAGNOSIS — L97922 Non-pressure chronic ulcer of unspecified part of left lower leg with fat layer exposed: Secondary | ICD-10-CM

## 2015-09-11 DIAGNOSIS — D689 Coagulation defect, unspecified: Secondary | ICD-10-CM | POA: Diagnosis not present

## 2015-09-11 DIAGNOSIS — E11649 Type 2 diabetes mellitus with hypoglycemia without coma: Secondary | ICD-10-CM | POA: Diagnosis present

## 2015-09-11 DIAGNOSIS — N186 End stage renal disease: Secondary | ICD-10-CM

## 2015-09-11 DIAGNOSIS — E119 Type 2 diabetes mellitus without complications: Secondary | ICD-10-CM | POA: Diagnosis not present

## 2015-09-11 DIAGNOSIS — Z79899 Other long term (current) drug therapy: Secondary | ICD-10-CM | POA: Diagnosis not present

## 2015-09-11 DIAGNOSIS — E46 Unspecified protein-calorie malnutrition: Secondary | ICD-10-CM | POA: Diagnosis present

## 2015-09-11 DIAGNOSIS — Z888 Allergy status to other drugs, medicaments and biological substances status: Secondary | ICD-10-CM | POA: Diagnosis not present

## 2015-09-11 DIAGNOSIS — I959 Hypotension, unspecified: Secondary | ICD-10-CM | POA: Diagnosis present

## 2015-09-11 DIAGNOSIS — N184 Chronic kidney disease, stage 4 (severe): Secondary | ICD-10-CM | POA: Diagnosis not present

## 2015-09-11 DIAGNOSIS — L89894 Pressure ulcer of other site, stage 4: Secondary | ICD-10-CM | POA: Diagnosis present

## 2015-09-11 DIAGNOSIS — K279 Peptic ulcer, site unspecified, unspecified as acute or chronic, without hemorrhage or perforation: Secondary | ICD-10-CM | POA: Diagnosis present

## 2015-09-11 DIAGNOSIS — I471 Supraventricular tachycardia: Secondary | ICD-10-CM | POA: Diagnosis present

## 2015-09-11 DIAGNOSIS — Z66 Do not resuscitate: Secondary | ICD-10-CM | POA: Diagnosis not present

## 2015-09-11 DIAGNOSIS — R509 Fever, unspecified: Secondary | ICD-10-CM | POA: Insufficient documentation

## 2015-09-11 DIAGNOSIS — D631 Anemia in chronic kidney disease: Secondary | ICD-10-CM | POA: Diagnosis not present

## 2015-09-11 DIAGNOSIS — Z515 Encounter for palliative care: Secondary | ICD-10-CM | POA: Diagnosis not present

## 2015-09-11 DIAGNOSIS — Z89519 Acquired absence of unspecified leg below knee: Secondary | ICD-10-CM

## 2015-09-11 DIAGNOSIS — Z7982 Long term (current) use of aspirin: Secondary | ICD-10-CM

## 2015-09-11 DIAGNOSIS — G40901 Epilepsy, unspecified, not intractable, with status epilepticus: Secondary | ICD-10-CM | POA: Diagnosis not present

## 2015-09-11 DIAGNOSIS — R911 Solitary pulmonary nodule: Secondary | ICD-10-CM | POA: Diagnosis not present

## 2015-09-11 DIAGNOSIS — Z7189 Other specified counseling: Secondary | ICD-10-CM | POA: Diagnosis not present

## 2015-09-11 DIAGNOSIS — D696 Thrombocytopenia, unspecified: Secondary | ICD-10-CM | POA: Diagnosis present

## 2015-09-11 DIAGNOSIS — M899 Disorder of bone, unspecified: Secondary | ICD-10-CM | POA: Diagnosis present

## 2015-09-11 DIAGNOSIS — Z8673 Personal history of transient ischemic attack (TIA), and cerebral infarction without residual deficits: Secondary | ICD-10-CM

## 2015-09-11 DIAGNOSIS — L97909 Non-pressure chronic ulcer of unspecified part of unspecified lower leg with unspecified severity: Secondary | ICD-10-CM | POA: Diagnosis present

## 2015-09-11 DIAGNOSIS — F17210 Nicotine dependence, cigarettes, uncomplicated: Secondary | ICD-10-CM | POA: Diagnosis present

## 2015-09-11 DIAGNOSIS — N2581 Secondary hyperparathyroidism of renal origin: Secondary | ICD-10-CM | POA: Diagnosis not present

## 2015-09-11 DIAGNOSIS — Z89512 Acquired absence of left leg below knee: Secondary | ICD-10-CM

## 2015-09-11 DIAGNOSIS — E1140 Type 2 diabetes mellitus with diabetic neuropathy, unspecified: Secondary | ICD-10-CM | POA: Diagnosis present

## 2015-09-11 DIAGNOSIS — Z992 Dependence on renal dialysis: Secondary | ICD-10-CM

## 2015-09-11 DIAGNOSIS — J80 Acute respiratory distress syndrome: Secondary | ICD-10-CM | POA: Diagnosis not present

## 2015-09-11 DIAGNOSIS — I739 Peripheral vascular disease, unspecified: Secondary | ICD-10-CM | POA: Diagnosis not present

## 2015-09-11 DIAGNOSIS — E1152 Type 2 diabetes mellitus with diabetic peripheral angiopathy with gangrene: Secondary | ICD-10-CM | POA: Diagnosis not present

## 2015-09-11 DIAGNOSIS — Z959 Presence of cardiac and vascular implant and graft, unspecified: Secondary | ICD-10-CM | POA: Diagnosis not present

## 2015-09-11 DIAGNOSIS — R0603 Acute respiratory distress: Secondary | ICD-10-CM

## 2015-09-11 DIAGNOSIS — J449 Chronic obstructive pulmonary disease, unspecified: Secondary | ICD-10-CM | POA: Diagnosis present

## 2015-09-11 DIAGNOSIS — E871 Hypo-osmolality and hyponatremia: Secondary | ICD-10-CM | POA: Diagnosis not present

## 2015-09-11 DIAGNOSIS — R0682 Tachypnea, not elsewhere classified: Secondary | ICD-10-CM | POA: Insufficient documentation

## 2015-09-11 DIAGNOSIS — R011 Cardiac murmur, unspecified: Secondary | ICD-10-CM | POA: Diagnosis present

## 2015-09-11 DIAGNOSIS — J9811 Atelectasis: Secondary | ICD-10-CM | POA: Diagnosis not present

## 2015-09-11 DIAGNOSIS — R569 Unspecified convulsions: Secondary | ICD-10-CM

## 2015-09-11 DIAGNOSIS — E440 Moderate protein-calorie malnutrition: Secondary | ICD-10-CM | POA: Insufficient documentation

## 2015-09-11 DIAGNOSIS — J9600 Acute respiratory failure, unspecified whether with hypoxia or hypercapnia: Secondary | ICD-10-CM

## 2015-09-11 DIAGNOSIS — E785 Hyperlipidemia, unspecified: Secondary | ICD-10-CM | POA: Diagnosis not present

## 2015-09-11 DIAGNOSIS — Z978 Presence of other specified devices: Secondary | ICD-10-CM

## 2015-09-11 DIAGNOSIS — R0902 Hypoxemia: Secondary | ICD-10-CM | POA: Diagnosis not present

## 2015-09-11 DIAGNOSIS — K769 Liver disease, unspecified: Secondary | ICD-10-CM

## 2015-09-11 DIAGNOSIS — K7689 Other specified diseases of liver: Secondary | ICD-10-CM | POA: Diagnosis not present

## 2015-09-11 LAB — CBC WITH DIFFERENTIAL/PLATELET
Basophils Absolute: 0 10*3/uL (ref 0.0–0.1)
Basophils Relative: 0 %
EOS ABS: 0.3 10*3/uL (ref 0.0–0.7)
EOS PCT: 2 %
HCT: 42.3 % (ref 39.0–52.0)
Hemoglobin: 12.8 g/dL — ABNORMAL LOW (ref 13.0–17.0)
LYMPHS ABS: 1.7 10*3/uL (ref 0.7–4.0)
Lymphocytes Relative: 14 %
MCH: 28.1 pg (ref 26.0–34.0)
MCHC: 30.3 g/dL (ref 30.0–36.0)
MCV: 92.8 fL (ref 78.0–100.0)
Monocytes Absolute: 0.9 10*3/uL (ref 0.1–1.0)
Monocytes Relative: 7 %
Neutro Abs: 9.1 10*3/uL — ABNORMAL HIGH (ref 1.7–7.7)
Neutrophils Relative %: 77 %
PLATELETS: 137 10*3/uL — AB (ref 150–400)
RBC: 4.56 MIL/uL (ref 4.22–5.81)
RDW: 17.5 % — ABNORMAL HIGH (ref 11.5–15.5)
WBC: 11.9 10*3/uL — AB (ref 4.0–10.5)

## 2015-09-11 LAB — COMPREHENSIVE METABOLIC PANEL
ALT: 12 U/L — AB (ref 17–63)
AST: 25 U/L (ref 15–41)
Albumin: 3.9 g/dL (ref 3.5–5.0)
Alkaline Phosphatase: 104 U/L (ref 38–126)
Anion gap: 19 — ABNORMAL HIGH (ref 5–15)
BILIRUBIN TOTAL: 0.6 mg/dL (ref 0.3–1.2)
BUN: 55 mg/dL — AB (ref 6–20)
CHLORIDE: 99 mmol/L — AB (ref 101–111)
CO2: 22 mmol/L (ref 22–32)
CREATININE: 7.4 mg/dL — AB (ref 0.61–1.24)
Calcium: 9 mg/dL (ref 8.9–10.3)
GFR calc Af Amer: 8 mL/min — ABNORMAL LOW (ref >60)
GFR, EST NON AFRICAN AMERICAN: 7 mL/min — AB (ref >60)
Glucose, Bld: 129 mg/dL — ABNORMAL HIGH (ref 65–99)
Potassium: 4.4 mmol/L (ref 3.5–5.1)
Sodium: 140 mmol/L (ref 135–145)
TOTAL PROTEIN: 7.6 g/dL (ref 6.5–8.1)

## 2015-09-11 LAB — PHOSPHORUS: Phosphorus: 5.7 mg/dL — ABNORMAL HIGH (ref 2.5–4.6)

## 2015-09-11 LAB — GLUCOSE, CAPILLARY
Glucose-Capillary: 88 mg/dL (ref 65–99)
Glucose-Capillary: 93 mg/dL (ref 65–99)
Glucose-Capillary: 131 mg/dL — ABNORMAL HIGH (ref 65–99)

## 2015-09-11 LAB — MAGNESIUM: MAGNESIUM: 2.2 mg/dL (ref 1.7–2.4)

## 2015-09-11 LAB — TROPONIN I: Troponin I: 0.15 ng/mL — ABNORMAL HIGH (ref <0.031)

## 2015-09-11 LAB — AMMONIA: AMMONIA: 41 umol/L — AB (ref 9–35)

## 2015-09-11 LAB — LACTIC ACID, PLASMA: Lactic Acid, Venous: 6 mmol/L (ref 0.5–2.0)

## 2015-09-11 MED ORDER — METOPROLOL TARTRATE 1 MG/ML IV SOLN
2.5000 mg | INTRAVENOUS | Status: DC | PRN
  Administered 2015-09-11: 5 mg via INTRAVENOUS
  Filled 2015-09-11: qty 5

## 2015-09-11 MED ORDER — LORAZEPAM 2 MG/ML IJ SOLN
2.0000 mg | INTRAMUSCULAR | Status: DC | PRN
  Administered 2015-09-14: 2 mg via INTRAVENOUS
  Filled 2015-09-11 (×4): qty 1

## 2015-09-11 MED ORDER — PROPOFOL 1000 MG/100ML IV EMUL
INTRAVENOUS | Status: AC
  Administered 2015-09-11: 20 ug/kg/min via INTRAVENOUS
  Filled 2015-09-11: qty 100

## 2015-09-11 MED ORDER — INSULIN ASPART 100 UNIT/ML ~~LOC~~ SOLN
0.0000 [IU] | SUBCUTANEOUS | Status: DC
  Administered 2015-09-12: 3 [IU] via SUBCUTANEOUS
  Administered 2015-09-12: 2 [IU] via SUBCUTANEOUS
  Administered 2015-09-12: 1 [IU] via SUBCUTANEOUS
  Administered 2015-09-12: 2 [IU] via SUBCUTANEOUS

## 2015-09-11 MED ORDER — CHLORHEXIDINE GLUCONATE 0.12% ORAL RINSE (MEDLINE KIT)
15.0000 mL | Freq: Two times a day (BID) | OROMUCOSAL | Status: DC
  Administered 2015-09-11 – 2015-09-18 (×14): 15 mL via OROMUCOSAL

## 2015-09-11 MED ORDER — HEPARIN SODIUM (PORCINE) 1000 UNIT/ML IJ SOLN
1.8000 mL | Freq: Once | INTRAMUSCULAR | Status: AC
  Administered 2015-09-11: 1800 [IU] via INTRAVENOUS
  Filled 2015-09-11: qty 2

## 2015-09-11 MED ORDER — LORAZEPAM 2 MG/ML IJ SOLN
4.0000 mg | Freq: Once | INTRAMUSCULAR | Status: AC
  Administered 2015-09-11: 4 mg via INTRAVENOUS

## 2015-09-11 MED ORDER — MIDAZOLAM HCL 2 MG/2ML IJ SOLN
2.0000 mg | Freq: Once | INTRAMUSCULAR | Status: AC
  Administered 2015-09-11: 2 mg via INTRAVENOUS

## 2015-09-11 MED ORDER — PIPERACILLIN-TAZOBACTAM IN DEX 2-0.25 GM/50ML IV SOLN
2.2500 g | Freq: Three times a day (TID) | INTRAVENOUS | Status: DC
  Administered 2015-09-11 – 2015-09-14 (×8): 2.25 g via INTRAVENOUS
  Filled 2015-09-11 (×11): qty 50

## 2015-09-11 MED ORDER — ETOMIDATE 2 MG/ML IV SOLN
20.0000 mg | Freq: Once | INTRAVENOUS | Status: AC
  Administered 2015-09-11: 20 mg via INTRAVENOUS

## 2015-09-11 MED ORDER — ANTISEPTIC ORAL RINSE SOLUTION (CORINZ)
7.0000 mL | Freq: Four times a day (QID) | OROMUCOSAL | Status: DC
  Administered 2015-09-11 – 2015-09-18 (×27): 7 mL via OROMUCOSAL

## 2015-09-11 MED ORDER — FENTANYL CITRATE (PF) 100 MCG/2ML IJ SOLN
INTRAMUSCULAR | Status: AC
  Administered 2015-09-11: 100 ug via INTRAVENOUS
  Filled 2015-09-11: qty 2

## 2015-09-11 MED ORDER — MIDAZOLAM HCL 2 MG/2ML IJ SOLN
INTRAMUSCULAR | Status: AC
  Administered 2015-09-11: 2 mg via INTRAVENOUS
  Filled 2015-09-11: qty 2

## 2015-09-11 MED ORDER — PROPOFOL 1000 MG/100ML IV EMUL
0.0000 ug/kg/min | INTRAVENOUS | Status: DC
  Administered 2015-09-11: 20 ug/kg/min via INTRAVENOUS
  Administered 2015-09-12: 50 ug/kg/min via INTRAVENOUS
  Administered 2015-09-12: 40 ug/kg/min via INTRAVENOUS
  Filled 2015-09-11 (×2): qty 100

## 2015-09-11 MED ORDER — FAMOTIDINE IN NACL 20-0.9 MG/50ML-% IV SOLN
20.0000 mg | INTRAVENOUS | Status: DC
  Administered 2015-09-12 (×2): 20 mg via INTRAVENOUS
  Filled 2015-09-11 (×3): qty 50

## 2015-09-11 MED ORDER — FENTANYL CITRATE (PF) 100 MCG/2ML IJ SOLN
100.0000 ug | INTRAMUSCULAR | Status: AC | PRN
Start: 2015-09-11 — End: 2015-09-11
  Administered 2015-09-11 (×3): 100 ug via INTRAVENOUS
  Filled 2015-09-11: qty 2

## 2015-09-11 MED ORDER — INSULIN ASPART 100 UNIT/ML ~~LOC~~ SOLN: 0.0000 [IU] | Freq: Three times a day (TID) | SUBCUTANEOUS | Status: DC

## 2015-09-11 MED ORDER — VANCOMYCIN HCL 10 G IV SOLR
1500.0000 mg | Freq: Once | INTRAVENOUS | Status: AC
  Administered 2015-09-12: 1500 mg via INTRAVENOUS
  Filled 2015-09-11 (×2): qty 1500

## 2015-09-11 MED ORDER — ROCURONIUM BROMIDE 50 MG/5ML IV SOLN
70.0000 mg | Freq: Once | INTRAVENOUS | Status: AC
  Administered 2015-09-11: 70 mg via INTRAVENOUS
  Filled 2015-09-11: qty 7

## 2015-09-11 MED ORDER — FENTANYL CITRATE (PF) 100 MCG/2ML IJ SOLN
100.0000 ug | INTRAMUSCULAR | Status: DC | PRN
  Administered 2015-09-12 – 2015-09-16 (×6): 100 ug via INTRAVENOUS
  Filled 2015-09-11 (×7): qty 2

## 2015-09-11 NOTE — Patient Instructions (Addendum)
You are being hospitalized for further testing to determine potential cause of your confusion. We will do blood tests and a CT Scan of Brain and other tests.  Follow-up after hospitalization with your primary doctor Dr Nori Riis

## 2015-09-11 NOTE — Procedures (Signed)
Intubation Procedure Note John [name] [id number] [birth year]  Procedure: Intubation Indications: Airway protection and maintenance  Procedure Details Consent: Risks of procedure as well as the alternatives and risks of each were explained to the (patient/caregiver).  Consent for procedure obtained. Time Out: Verified patient identification, verified procedure, site/side was marked, verified correct patient position, special equipment/implants available, medications/allergies/relevent history reviewed, required imaging and test results available.  Performed  Drugs Etomidate 20mg  IV, Rocuronium 70mg  IV DL x 1 with MAC 3 blade Grade 1 view 8.0 ET tube passed through cords under direct visualization Placement confirmed with bilateral breath sounds, positive EtCO2 change and smoke in tube   Evaluation Hemodynamic Status: BP stable throughout; O2 sats: stable throughout Patient's Current Condition: stable Complications: No apparent complications Patient did tolerate procedure well. Chest X-ray ordered to verify placement.  CXR: pending.   [name] 09/22/2015

## 2015-09-11 NOTE — H&P (Signed)
PULMONARY / CRITICAL CARE MEDICINE   Name: John [name] MRN: [id number] DOB: 1956-02-18    ADMISSION DATE:  10/06/2015 CONSULTATION DATE:  09/19/2015  REFERRING MD:  Family Medicine Teaching Service  CHIEF COMPLAINT:  Acute Encephalopathy/Seziures  HISTORY OF PRESENT ILLNESS:   John [name] is a 60 year old man with a past medical history significant for ESRD on HD TTS, Insulin Dependent Type 2 DM, PAD s/p left BKA, COPD, CKD Stage III, Stroke/TIAs presented with lethargy and acute encephalopathy. Patient missed Thursday and Saturday dialysis sessions (prior notes say transportation issues, family reports clotting at dialysis). Did receive 2 hours of dialysis today but ended early due to clotting off. Family reports first noticing a change in mental status yesterday with no improvement after dialysis today. Patient was seen in [facility] today and was able to converse with them and answer questions but was very confused. He was sent for direct admission from clinic. He had a brief seizure while in CT scan on arrival with another seizure once he arrived to the floor. Did not receive any medications as the seizures lasted <1 minute. Mental status has deteriorated and patient is now somnolent and only responds to painful stimuli. No history of EOTH use or cirrhosis.   Found to have new RUL lung nodule with a large right lobe liver lesion. CT of his head shows old infarcts. Patient's mental status has not improved following the seizures and PCCM was called to evaluate the patient.   PAST MEDICAL HISTORY :  He  has a past medical history of COPD (chronic obstructive pulmonary disease) ([place]); Chronic back pain; Hyperlipidemia; Foot pain; Peripheral vascular disease ([place]); Asthma; Ulcer (1985); Fracture of foot; Neuromuscular disorder ([place]); Chronic kidney disease; Hypertension; Stroke [facility]); Diabetes mellitus; and Arthritis.  PAST SURGICAL HISTORY: He  has past surgical history that includes Neck  surgery; Eye surgery; Cardiac catheterization; Endarterectomy (12/18/2011); Carotid endarterectomy (Right); Axillary-femoral Bypass Graft (06/18/2012); Femoral-popliteal Bypass Graft (06/18/2012); Amputation (06/18/2012); AV fistula placement (06/24/2012); lower extremity angiogram (Right, 11/21/2011); Enucleation (Left); Below knee leg amputation (Left); Amputation toe (Left, 2016); Transmetatarsal amputation (Left, 2016); Ligation of arteriovenous  fistula (Left, 08/27/2015); and Insertion of dialysis catheter (Left, 08/27/2015).  Allergies  Allergen Reactions  . Nicoderm [Nicotine] Itching and Rash  . Gabapentin Other (See Comments)    Can't walk, shuts legs down  . Vicodin [Hydrocodone-Acetaminophen] Hives and Itching    Current Facility-Administered Medications on File Prior to Encounter  Medication  . ferumoxytol (FERAHEME) 1,020 mg in sodium chloride 0.9 % 100 mL IVPB   Current Outpatient Prescriptions on File Prior to Encounter  Medication Sig  . aspirin 81 MG chewable tablet Chew 81 mg by mouth daily.   Marland Kitchen atorvastatin (LIPITOR) 40 MG tablet Take 40 mg by mouth every evening.  . cephALEXin (KEFLEX) 500 MG capsule Take 1 capsule (500 mg total) by mouth daily.  . cholecalciferol (VITAMIN D) 1000 UNITS tablet Take 1,000 Units by mouth daily.  . collagenase (SANTYL) ointment Apply 1 application topically daily. Please apply to left popliteal fossa  . collagenase (SANTYL) ointment Apply 1 application topically daily.  . fentaNYL (DURAGESIC - DOSED MCG/HR) 12 MCG/HR Place 1 patch (12.5 mcg total) onto the skin every 3 (three) days. Change patch tomorrow  . hydrocerin (EUCERIN) CREA Apply 1 application topically 2 (two) times daily. (Patient taking differently: Apply 1 application topically daily. )  . insulin glargine (LANTUS) 100 UNIT/ML injection Inject 0.05 mLs (5 Units total) into the skin daily. (Patient  taking differently: Inject 5 Units into the skin daily. )  . insulin lispro (HUMALOG) 100  UNIT/ML injection Inject 2-3 Units into the skin 2 (two) times daily as needed for high blood sugar (CBG >180). Per sliding scale: inject 2 units subcutaneously for CBG 180-299, inject 3 units for CBG >300  . lanthanum (FOSRENOL) 1000 MG chewable tablet Chew 1,000 mg by mouth See admin instructions. Take 1 tablet (1000 mg) by mouth with large meals (once every 2-3 days)  . lidocaine-EPINEPHrine-tetracaine (LET) GEL Apply topically to left arm 1 hour before dialysis  . lidocaine-prilocaine (EMLA) cream Apply 1 application topically 3 (three) times a week. Reported on 08/23/2015  . methocarbamol (ROBAXIN) 500 MG tablet Take 1 tablet (500 mg total) by mouth every 6 (six) hours as needed for muscle spasms.  . multivitamin (RENA-VIT) TABS tablet Take 1 tablet by mouth daily.   Marland Kitchen oxyCODONE-acetaminophen (PERCOCET) 10-325 MG tablet Take one by mouth  Every 6 hours prn pain do not fill before September 14 2015  . PROAIR HFA 108 (90 Base) MCG/ACT inhaler INHALE 2 PUFFS BY MOUTH EVERY 4 HOURS AS NEEDED FOR WHEEZING OR SHORTNESS OF BREATH  . [DISCONTINUED] tiotropium (SPIRIVA HANDIHALER) 18 MCG inhalation capsule Place 1 capsule (18 mcg total) into inhaler and inhale daily.    FAMILY HISTORY:  His indicated that his mother is deceased. He indicated that his father is deceased. He indicated that his brother is alive.   SOCIAL HISTORY: He  reports that he has been smoking Cigarettes.  He has a 8 pack-year smoking history. He has never used smokeless tobacco. He reports that he does not drink alcohol or use illicit drugs.  REVIEW OF SYSTEMS:   Unable to obtain due to patient's mental status  SUBJECTIVE:  Patient appears post-ictal, not responsive to verbal stimuli  VITAL SIGNS: BP 186/110 mmHg  Pulse 87  Temp(Src) 97.9 F (36.6 C) (Oral)  Resp 16  Ht 5\' 6"  (1.676 m)  Wt 160 lb 15 oz (73 kg)  BMI 25.99 kg/m2  SpO2 98%  HEMODYNAMICS:    VENTILATOR SETTINGS:    INTAKE / OUTPUT: I/O last 3 completed  shifts: In: 38 [P.O.:60] Out: -   PHYSICAL EXAMINATION: General: Chronically ill appearing male, appears older than stated age, only responsive to painful stimuli Neuro: No verbal response, spontaneously moving all extremities, does not respond to verbal stimuli, withdraws and opens eyes to painful stimuli HEENT: Right pupil PERRLA, EOMI. Left eye is prosthetic.  Cardiovascular: Tachycardic, regular rhythm, no murmurs, rubs or gallops Lungs: CTAB Abdomen: Soft, non-tender, non-distended, BS+ Skin:  L BKA with posterior chronic ulceration, bandaged  LABS:  BMET  Recent Labs Lab 09/10/2015 2009  NA 140  K 4.4  CL 99*  CO2 22  BUN 55*  CREATININE 7.40*  GLUCOSE 129*    Electrolytes  Recent Labs Lab 09/29/2015 1943 09/25/2015 2009  CALCIUM  --  9.0  MG  --  2.2  PHOS 5.7*  --     CBC  Recent Labs Lab 09/10/2015 2009  WBC 11.9*  HGB 12.8*  HCT 42.3  PLT 137*    Coag's No results for input(s): APTT, INR in the last 168 hours.  Sepsis Markers  Recent Labs Lab 09/22/2015 1952  LATICACIDVEN 6.0*    ABG No results for input(s): PHART, PCO2ART, PO2ART in the last 168 hours.  Liver Enzymes  Recent Labs Lab 09/24/2015 2009  AST 25  ALT 12*  ALKPHOS 104  BILITOT 0.6  ALBUMIN  3.9    Cardiac Enzymes  Recent Labs Lab 09/12/2015 1943  TROPONINI 0.15*    Glucose  Recent Labs Lab 09/22/2015 1819  GLUCAP 88    Imaging Dg Chest 1 View  09/10/2015  CLINICAL DATA:  Acute encephalopathy today. Headache and possible seizure today. EXAM: CHEST 1 VIEW COMPARISON:  CT chest 09/10/2015. Single view of the chest 08/27/2015. FINDINGS: Dialysis catheter remains in place. Lung volumes are low with mild basilar atelectasis. Nodule in the right upper lobe is identified as seen on the prior exams. Cardiac silhouette is accentuated by low lung volumes. IMPRESSION: No acute disease. Right upper lobe pulmonary nodule seen on prior exams. Electronically Signed   By: Inge Rise M.D.   On: 10/01/2015 19:06   Ct Head Wo Contrast  09/13/2015  CLINICAL DATA:  60 year old presenting with acute encephalopathy. EXAM: CT HEAD WITHOUT CONTRAST TECHNIQUE: Contiguous axial images were obtained from the base of the skull through the vertex without intravenous contrast. COMPARISON:  None. FINDINGS: Motion blurred several of the images but the study appears diagnostic. Ventricular system normal in size and appearance for age. No significant atrophy for age. Low attenuation in the right basal ganglia localizing to the anterior limb of the internal capsule. Mild to moderate changes of small vessel disease of the white matter diffusely. No mass lesion. No midline shift. No acute hemorrhage or hematoma. No extra-axial fluid collections. No skull fracture or other focal osseous abnormality involving the skull. Visualized paranasal sinuses, bilateral mastoid air cells and bilateral middle ear cavities well-aerated. Severe bilateral carotid siphon and vertebral artery atherosclerosis. Left globe prosthesis. IMPRESSION: 1. Age indeterminate nonhemorrhagic lacunar stroke involving the right basal ganglia localizing to the anterior limb of the right internal capsule. 2. No acute intracranial abnormality otherwise. 3. Mild to moderate chronic microvascular ischemic changes of the white matter. 4. Severe bilateral carotid siphon and vertebral artery atherosclerosis. I reviewed these images with Dr. Rolla Flatten, neuroradiologist, who agrees with this interpretation. Electronically Signed   By: Evangeline Dakin M.D.   On: 09/12/2015 19:30    STUDIES:  04/04 > CXR with new RUL pulmonary nodule  04/04 > 2.4 x 2.3 x 2.0 cm RUL nodule. 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   ASSESSMENT / PLAN:  PULMONARY A: New RUL lung nodule concerning for neoplasm Intubated for airway protection R/O aspiration PNA P:   Vent Bundle CXR  CARDIOVASCULAR A:  Hx of PAD SVT, HR 180 P:  Metoprolol 2.5-5 mg IV q3hr prn  RENAL A:   ESRD on HD TTS, missed last 2 sessions, 2 hour session today P:   Nephrology consult CMP - no obvious uremia or electrolyte disturbances  GASTROINTESTINAL A:  Intubated in ICU P:   Pepcid 20 mg IV q24hr   HEMATOLOGIC A:   Liver mass with new RUL lung nodule suspicious for metastatic neoplasm P:  Non-con MRI brain  INFECTIOUS A:   R/o aspiration PNA Pressure ulcer popliteal wound infection P:  Follow up cultures Vanc Zosyn CXR Wound Care May require LP if spikes fever or develops signs of meningitis  ENDOCRINE A:   DM Type 2 P:   CBG q4hr SSI-s  NEUROLOGIC A:   New Onset Seziures P:   Appreciate Neurology Consult Keppra per Neuro Continuous EEG MRI RASS goal: -1   FAMILY  - Updates: Family updated   Pulmonary and [place] Pager: [phone]  09/25/2015, 8:59 PM

## 2015-09-11 NOTE — Progress Notes (Addendum)
Significant Event Note   S: Patient was a planned direct admit from [facility] for acute encephalopathy. Patient with missed HD x2 and incomplete HD this AM due to hypotension. Head CT was ordered from clinic due to concern for CVA as cause of AMS. Was called to floor for two seizures lasting less than one minute. Patient without h/o seizures.   O: BP 186/110 mmHg  Pulse 87  Temp(Src) 97.9 F (36.6 C) (Oral)  Resp 16  Ht 5\' 6"  (1.676 m)  Wt 160 lb 15 oz (73 kg)  BMI 25.99 kg/m2  SpO2 98%  CV: RRR.  Lungs: CTAB.  Neuro: Minimally responsive. No verbal response. Withdraws to pain. Open eyes to painful stimuli. Right pupil small but reactive (prosthetic left eye).   A/P: John [name] is 60 y.o. male with PMH significant for ESRD on HD, T2DM, s/p left BKA with pressure ulcer popliteal wound infection currently being treated with Keflex outpatient, and severe PVD. Additionally, patient with new RUL lung nodule and large lesion occupying much of the right lobe of liver. CT head with age indeterminate nonhemorrhagic lacunar stroke. CBGs stable. Patient required respiratory support with non-rebreather. Stat labs (troponin, lactic acid, blood cultures x2, CBC, CMET, ammonia, and magnesium) were obtained. Started on Vancomycin and Zosyn per pharmacy. Nephrology was called to make sure they were aware patient may need emergent dialysis based on pending lab results. CCM was contacted. CCM evaluated the patient and, as mental status was not improving close to one hour after seizure, he was transferred to the ICU for further care/management. Full code status was confirmed with family members in the room, as well as wife wife via phone call.   Nicolette Bang, DO 09/14/2015, 8:19 PM PGY-1, [place] Medicine Service pager [phone]

## 2015-09-11 NOTE — Progress Notes (Signed)
Pharmacy Antibiotic Note John [name] is a 60 y.o. male admitted on 09/14/2015 with acute encephalopathy and concern for possible possible left BKA pressure ulcer popliteal wound infection in setting of ESRD with most recent IHD today for about two hours. Pharmacy has been consulted for Zosyn and vancomycin dosing.  Plan: 1. Zosyn 2.25 grams IV every 8 hours infused over 30 minutes  2. Vancomycin 1500 mg x 1 now with further doses to be given with IHD; will schedule once inpatient schedule confirmed  3. If vancomycin continued will obtain preHD level as SS; goal pre HD level 15- 25    Height: 5\' 6"  (167.6 cm) Weight: 160 lb 15 oz (73 kg) IBW/kg (Calculated) : 63.8  Temp (24hrs), Avg:98.2 F (36.8 C), Min:97.9 F (36.6 C), Max:98.5 F (36.9 C)  No results for input(s): WBC, CREATININE, LATICACIDVEN, VANCOTROUGH, VANCOPEAK, VANCORANDOM, GENTTROUGH, GENTPEAK, GENTRANDOM, TOBRATROUGH, TOBRAPEAK, TOBRARND, AMIKACINPEAK, AMIKACINTROU, AMIKACIN in the last 168 hours.  CrCl cannot be calculated (Patient has no serum creatinine result on file.).    Allergies  Allergen Reactions  . Nicoderm [Nicotine] Itching and Rash  . Gabapentin Other (See Comments)    Can't walk, shuts legs down  . Vicodin [Hydrocodone-Acetaminophen] Hives and Itching    Antimicrobials this admission: 4/4 Zosyn >>  4/4 vancomycin >>   Dose adjustments this admission: n/a  Microbiology results: px  Thank you for allowing pharmacy to be a part of this patient's care.  Vincenza Hews, PharmD, BCPS 09/17/2015, 7:55 PM Pager: [phone]

## 2015-09-11 NOTE — Consult Note (Signed)
[facility] P[facility]  I have seen and examined the patient with nurse practitioner/resident and agree with their note.  We formulated the plan together and I elicited the following history.    John [name] is a patient of the Family practice teaching service who has ESR and goes to dialysis on Tuesday, Thursday and Saturday.  Unfortunately he developed worsening lethargy after missing two scheduled dialysis sessions.  He was seen in the [facility] practice office on 4/4 and was noted to be confused so he was admitted to [facility].  He was also noted to have new lung and liver masses on a CT chest from yesterday.  He has recently been treated with keflex for a popliteal pressure ulcer.    Unfortunately after admission he had two seizures.  He was moved to the ICU after the second and was post ictal but able to localize.  However he started vomiting and showed signs of aspiration so he required intubation.  On exam (prior to intubation): Non-verbal Moving all four extremities somewhat violently, not following commands Lungs with rhonchi RRR, S4 gallop noted  CT head> subacute strokes  Acute encephalopathy> not protecting airway so he required intubation, will provide full vent support, work up for ongoing seizures and check MRI brain to look for either CVA or possible mass given lung mass seen yesterday; consult neurology; anti-epileptics per neurology  Aspiration pneumonitis> unclear, will check CXR now; hold antibiotics for now  ESRD> HD per renal  Popliteal pressure wound> ensure wound care consulted  I updated his sister at length this evening  My cc time 40 minutes independent of the resident team and independent of procedures  Roselie Awkward, MD [place] P[facility]M Pager: [phone] Cell: [phone] After 3pm or if no response, call [phone]

## 2015-09-11 NOTE — Progress Notes (Signed)
Subjective:    Patient ID: John [name], male    DOB: 1955/12/14, 60 y.o.   MRN: [id number]  John [name] is a 61 y.o. male presenting on 10/07/2015 for lethargic   Patient presents for a same day appointment. History provided by patient's brother in law John [name]).   HPI  LETHARGY / ACUTE ENCEPHALOPATHY:  - Family member reports recent change in mental status. Missed HD Thursday and Saturday, multiple issues with transportation and other reasons for missing HD. He did receive HD today 09/09/2015 for duration of about 2 hours only, at [facility] in [place]. States that symptoms were not resolved after hemodialysis, they sent him here for evaluation. - Patient is altered and unable to provide accurate history, he admits to "feeling fine" and cannot report any clear symptoms at this time, but does seem confused to family and his PCP - Additional recent history with referral to wound care for Left BKA ulcerations, treated with topical wound care and last visit 09/05/15 given Keflex 500mg  daily x 2 weeks for concerns of wound infection with popliteal pressure ulcer, scheduled to see Vascular surgery soon - Denies any active pain, chest pain, headache, vision change, abdominal pain, lower extremity pain, shortness of breath, difficulty breathing, dysuria (ESRD but admits still makes urine), diarrhea, recent illness cough / fever/chills   Social History  Substance Use Topics  . Smoking status: Current Some Day Smoker -- 0.20 packs/day for 40 years    Types: Cigarettes  . Smokeless tobacco: Never Used     Comment: 2-3 cigarettes a day  . Alcohol Use: No     Comment: Previous drinker - However, quit in 2006    Review of Systems Per HPI unless specifically indicated above     Objective:    BP 90/50 mmHg  Pulse 98  Temp(Src) 98.5 F (36.9 C) (Oral)  Wt   SpO2 98%  Wt Readings from Last 3 Encounters:  08/23/15 164 lb (74.39 kg)  07/23/15 164 lb 0.4 oz (74.4 kg)  06/13/15 181 lb 11.2  oz (82.419 kg)    Physical Exam  Constitutional: He appears well-developed and well-nourished. No distress.  Chronically ill appearing, sitting in wheelchair, partially cooperative  HENT:  Head: Normocephalic and atraumatic.  Mouth/Throat: Oropharynx is clear and moist.  Eyes: Conjunctivae and EOM are normal. Pupils are equal, round, and reactive to light. Right eye exhibits no discharge.  Left prosthetic eye. Right eye appears clear.  Neck: Normal range of motion. Neck supple. No thyromegaly present.  No JVD  Cardiovascular: Regular rhythm, normal heart sounds and intact distal pulses.   No murmur heard. Mild tachycardic Left upper arm AVF with palpable vibration and audible bruit on auscultation. No erythema tenderness or edema.  Pulmonary/Chest: Effort normal. No respiratory distress. He has no wheezes. He has rales (mild, left lower, limited by reduced cooperative with deep respirations). He exhibits no tenderness.  Abdominal: Soft. Bowel sounds are normal. He exhibits no distension and no mass. There is no tenderness. There is no rebound and no guarding.  Musculoskeletal: Normal range of motion. He exhibits no edema or tenderness.  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  Left fingers with some mild edema and chronic skin changes with dry gangrene appearance on left 3rd and 5th digits, otherwise distal pulses intact.  Lymphadenopathy:    He has no cervical adenopathy.  Neurological:  Awake, slowed cognition from baseline with some difficulty with word finding  and thoughts, speech is slowed, oriented to name/person, place John [name], doctors office), 2017 (took a long time to arrive at year), unsure month, unsure president. Grossly non-focal with CN-II-XII grossly intact and symmetrical. Minimial weakness left grip and upper extremity 4/5 compared to 5/5 on right, limited comparison on lower ext due to L-BKA.  Skin:  Skin is warm and dry. No rash noted. He is not diaphoretic.  Psychiatric:  Reduced affect compared to baseline  Nursing note and vitals reviewed.      Assessment & Plan:   Problem List Items Addressed This Visit    ESRD (end stage renal disease) on dialysis John [name])    Other Visit Diagnoses    Acute encephalopathy    -  Primary       63 yr M with complex PMH including uncontrolled DM (IDDM) with multiple complications ESRD on HD, s/p L-BKA, PAD, presents for acute visit with acute encephalopathy with confusion and earlier lethargy with multiple missed HD treatments Th-Sat, just received HD today 09/10/2015, without resolution of symptoms. Clinically patient low BP 90/50 with mild tachycardia otherwise afebrile and appears hemodynamically stable. Differential includes metabolic encephalopathy with ESRD and recent HD, concern for CVA with vascular history (high risk) and new mental status changes (non-focal), alternatively concern for possible left BKA pressure ulcer popliteal wound infection, may be contributing. - Patient seen and evaluated by PCP Dr Nori Riis today in clinic, who agrees with this assessment and admission - Direct admit to John [name], inpatient status, telemetry bed - Temporary admit orders placed, also ordered Head CT wo contrast, repeat CXR, CMET, Mag/Phos, CBC, Lactic Acid, would recommend wound care - Consider blood cultures and empiric IV antibiotics vs continue Keflex 500mg  daily during 2 week course (unsure if took pill today) - Also, reviewed recent results on CT Chest, outpatient 09/10/15 with what appears to be new diagnosis of lung and liver mass, new cancer diagnosis, Dr Nori Riis PCP will follow-up with these results and discuss with family  Patient remained stable in [facility] while awaiting [name] bed, family to bring him across to [name] once available.  No orders of the defined types were placed in this encounter.      Follow up plan:  [name] follow-up after  discharged.  John [name], John [name], PGY-3

## 2015-09-11 NOTE — Consult Note (Signed)
NEURO HOSPITALIST CONSULT NOTE      Reason for Consult: new onset seizure    History obtained from:    Chart    HPI:                                                                                                                                          John [name] is an 60 y.o. male with extension PMH, ESRD on HD and severe PAD with L BKA presented after having two witnessed seizures and not returning baseline. He was not given any AED's or ativan for his seizures. He was intubated for low GCS and to protect his airway. He apparently missed two days of HD this past week. Had a CT of his chest done was is found to have a new lung mass with potential mets to his liver.  Past Medical History  Diagnosis Date  . COPD (chronic obstructive pulmonary disease) ([place])   . Chronic back pain     Chronic narcotics for this for many years  . Hyperlipidemia   . Foot pain   . Peripheral vascular disease ([place])   . Asthma   . Ulcer 1985  . Fracture of foot     [place]- Right  . Neuromuscular disorder ([place])     periferal neuropathy  . Chronic kidney disease     CKD stage III  . Hypertension     not taking any medication  . Stroke ([place])     No residual "Mini strokes"  . Diabetes mellitus     Type II  . Arthritis     Past Surgical History  Procedure Laterality Date  . Neck surgery      "Titatium Plate"  . Eye surgery    . Cardiac catheterization    . Endarterectomy  12/18/2011    Procedure: ENDARTERECTOMY CAROTID;  Surgeon: Angelia Mould, MD;  Location: [facility];  Service: Vascular;  Laterality: Left;  . Carotid endarterectomy Right   . Axillary-femoral bypass graft  06/18/2012    Procedure: BYPASS GRAFT AXILLA-BIFEMORAL;  Surgeon: Angelia Mould, MD;  Location: [place];  Service: Vascular;  Laterality: Right;  Right Axillo to Right Femoral Bypass Graft  . Femoral-popliteal bypass graft  06/18/2012    Procedure: BYPASS GRAFT FEMORAL-POPLITEAL ARTERY;   Surgeon: Angelia Mould, MD;  Location: [facility] OR;  Service: Vascular;  Laterality: Right;  Right Femoral to Above Knee Popliteal Bypass Graft  . Amputation  06/18/2012    Procedure: AMPUTATION DIGIT;  Surgeon: Angelia Mould, MD;  Location[facility] OR;  Service: Vascular;  Laterality: Right;  Right side, amputation of fourth and fifth toes  . Av fistula placement  06/24/2012    Procedure: ARTERIOVENOUS (AV) FISTULA CREATION;  Surgeon: Angelia Mould, MD;  Location: [facility] OR;  Service: Vascular;  Laterality: Left;  . Lower extremity angiogram Right 11/21/2011    Procedure: LOWER EXTREMITY ANGIOGRAM;  Surgeon: Elam Dutch, MD;  Location: [facility] CATH LAB;  Service: Cardiovascular;  Laterality: Right;  . Enucleation Left     trauma as a child. Wears prosthesis.   Marland Kitchen Below knee leg amputation Left     [facility] - Dr. Sabino Dick  . Amputation toe Left 2016    2 toes  . Transmetatarsal amputation Left 2016  . Ligation of arteriovenous  fistula Left 08/27/2015    Procedure: LIGATION OF Left Arm ARTERIOVENOUS  FISTULA;  Surgeon: Rosetta Posner, MD;  Location: [place];  Service: Vascular;  Laterality: Left;  . Insertion of dialysis catheter Left 08/27/2015    Procedure: INSERTION OF Right Internal Jugular DIALYSIS CATHETER;  Surgeon: Rosetta Posner, MD;  Location: [place];  Service: Vascular;  Laterality: Left;  . Back surgery      Family History  Problem Relation Age of Onset  . Diabetes Mother   . Heart disease Mother     before age 61  . Hypertension Mother   . Heart attack Mother   . Diabetes Father     Amputation  . Heart disease Father     Before age 74  . Hypertension Father   . Heart attack Father   . Other Father     bleeding problems  . Diabetes Brother   . Heart disease Brother     Before age 32  . Hypertension Brother   . Heart attack Brother       Social History:  reports that he has been smoking Cigarettes.  He has a 8 pack-year smoking history. He has never used  smokeless tobacco. He reports that he does not drink alcohol or use illicit drugs.  Allergies  Allergen Reactions  . Nicoderm [Nicotine] Itching and Rash  . Gabapentin Other (See Comments)    Can't walk, shuts legs down  . Vicodin [Hydrocodone-Acetaminophen] Hives and Itching    MEDICATIONS:                                                                                                                     I have reviewed the patient's current medications.   ROS:  History obtained from chart review  General ROS: negative for - chills, fatigue, fever, night sweats, weight gain or weight loss Psychological ROS: negative for - behavioral disorder, hallucinations, memory difficulties, mood swings or suicidal ideation Ophthalmic ROS: negative for - blurry vision, double vision, eye pain or loss of vision ENT ROS: negative for - epistaxis, nasal discharge, oral lesions, sore throat, tinnitus or vertigo Allergy and Immunology ROS: negative for - hives or itchy/watery eyes Hematological and Lymphatic ROS: negative for - bleeding problems, bruising or swollen lymph nodes Endocrine ROS: negative for - galactorrhea, hair pattern changes, polydipsia/polyuria or temperature intolerance Respiratory ROS: negative for - cough, hemoptysis, shortness of breath or wheezing Cardiovascular ROS: negative for - chest pain, dyspnea on exertion, edema or irregular heartbeat Gastrointestinal ROS: negative for - abdominal pain, diarrhea, hematemesis, nausea/vomiting or stool incontinence Genito-Urinary ROS: negative for - dysuria, hematuria, incontinence or urinary frequency/urgency Musculoskeletal ROS: negative for - joint swelling or muscular weakness Neurological ROS: as noted in HPI Dermatological ROS: negative for rash and skin lesion changes   Blood pressure 117/91, pulse  108, temperature 97.9 F (36.6 C), temperature source Oral, resp. rate 33, height 5\' 8"  (1.727 m), weight 73 kg (160 lb 15 oz), SpO2 98 %.   Neurologic Examination:                                                                                                      HEENT-  Normocephalic, no lesions, without obvious abnormality.  Normal external eye and conjunctiva.  Normal TM's bilaterally.  Normal auditory canals and external ears. Normal external nose, mucus membranes and septum.  Normal pharynx. Cardiovascular- regular rate and rhythm, S1, S2 normal, no murmur, click, rub or gallop, pulses palpable throughout   Lungs- chest clear, no wheezing, rales, normal symmetric air entry, Heart exam - S1, S2 normal, no murmur, no gallop, rate regular Abdomen- soft, non-tender; bowel sounds normal; no masses,  no organomegaly Extremities- no edema   Neurological Examination Patient was just given paralytics and sedation for his intubation GCS 3 No cough/gag No corneals R eye is pinpoint L eye is prosthetic  No movement in extreme ties to pain  This is most likely all due to medications given a few minutes before examination. Based on the MICU exam, he was localizing in all extreme ties but was very confused and not following commands       Lab Results: Basic Metabolic Panel:  Recent Labs Lab 09/15/2015 1943 10/01/2015 2009  NA  --  140  K  --  4.4  CL  --  99*  CO2  --  22  GLUCOSE  --  129*  BUN  --  55*  CREATININE  --  7.40*  CALCIUM  --  9.0  MG  --  2.2  PHOS 5.7*  --     Liver Function Tests:  Recent Labs Lab 10/07/2015 2009  AST 25  ALT 12*  ALKPHOS 104  BILITOT 0.6  PROT 7.6  ALBUMIN 3.9   No results for input(s): LIPASE, AMYLASE in the last 168  hours.  Recent Labs Lab 09/29/2015 1952  AMMONIA 41*    CBC:  Recent Labs Lab 09/12/2015 2009  WBC 11.9*  NEUTROABS 9.1*  HGB 12.8*  HCT 42.3  MCV 92.8  PLT 137*    Cardiac Enzymes:  Recent Labs Lab  09/23/2015 1943  TROPONINI 0.15*    Lipid Panel: No results for input(s): CHOL, TRIG, HDL, CHOLHDL, VLDL, LDLCALC in the last 168 hours.  CBG:  Recent Labs Lab 09/22/2015 1819 10/04/2015 1920 09/13/2015 1956  GLUCAP 81 93 131*    Microbiology: Results for orders placed or performed during the hospital encounter of 01/19/15  Blood culture (routine x 2)     Status: None   Collection Time: 01/19/15 10:20 PM  Result Value Ref Range Status   Specimen Description BLOOD RIGHT WRIST  Final   Special Requests BOTTLES DRAWN AEROBIC AND ANAEROBIC 5CC  Final   Culture NO GROWTH 5 DAYS  Final   Report Status 01/25/2015 FINAL  Final  Blood culture (routine x 2)     Status: None   Collection Time: 01/19/15 10:28 PM  Result Value Ref Range Status   Specimen Description BLOOD RIGHT HAND  Final   Special Requests BOTTLES DRAWN AEROBIC ONLY 5CC  Final   Culture NO GROWTH 5 DAYS  Final   Report Status 01/25/2015 FINAL  Final    Coagulation Studies: No results for input(s): LABPROT, INR in the last 72 hours.  Imaging: Dg Chest 1 View  10/04/2015  CLINICAL DATA:  Acute encephalopathy today. Headache and possible seizure today. EXAM: CHEST 1 VIEW COMPARISON:  CT chest 09/10/2015. Single view of the chest 08/27/2015. FINDINGS: Dialysis catheter remains in place. Lung volumes are low with mild basilar atelectasis. Nodule in the right upper lobe is identified as seen on the prior exams. Cardiac silhouette is accentuated by low lung volumes. IMPRESSION: No acute disease. Right upper lobe pulmonary nodule seen on prior exams. Electronically Signed   By: Inge Rise M.D.   On: 10/01/2015 19:06   Ct Head Wo Contrast  10/06/2015  CLINICAL DATA:  60 year old presenting with acute encephalopathy. EXAM: CT HEAD WITHOUT CONTRAST TECHNIQUE: Contiguous axial images were obtained from the base of the skull through the vertex without intravenous contrast. COMPARISON:  None. FINDINGS: Motion blurred several of the  images but the study appears diagnostic. Ventricular system normal in size and appearance for age. No significant atrophy for age. Low attenuation in the right basal ganglia localizing to the anterior limb of the internal capsule. Mild to moderate changes of small vessel disease of the white matter diffusely. No mass lesion. No midline shift. No acute hemorrhage or hematoma. No extra-axial fluid collections. No skull fracture or other focal osseous abnormality involving the skull. Visualized paranasal sinuses, bilateral mastoid air cells and bilateral middle ear cavities well-aerated. Severe bilateral carotid siphon and vertebral artery atherosclerosis. Left globe prosthesis. IMPRESSION: 1. Age indeterminate nonhemorrhagic lacunar stroke involving the right basal ganglia localizing to the anterior limb of the right internal capsule. 2. No acute intracranial abnormality otherwise. 3. Mild to moderate chronic microvascular ischemic changes of the white matter. 4. Severe bilateral carotid siphon and vertebral artery atherosclerosis. I reviewed these images with Dr. Rolla Flatten, neuroradiologist, who agrees with this interpretation. Electronically Signed   By: Evangeline Dakin M.D.   On: 09/09/2015 19:30   Ct Chest Wo Contrast  09/10/2015  CLINICAL DATA:  Pulmonary nodule EXAM: CT CHEST WITHOUT CONTRAST TECHNIQUE: Multidetector CT imaging of the chest was performed following  the standard protocol without IV contrast. COMPARISON:  Chest radiograph August 27, 2015 FINDINGS: Mediastinum/Lymph Nodes: Thyroid appears normal. There are scattered small mediastinal lymph nodes, most of which do not meet size criteria for pathologic significance. There is a lymph node just anterior to the carina measuring 1.4 x 0.9 cm. A second nearby lymph node in this region measures 1.1 x 1.0 cm. There is a central catheter with its tip in the right atrium. There are multiple foci of coronary artery calcification. Pericardium is not  thickened. There is no thoracic aortic aneurysm. There is moderate calcification at the origins of the great vessels. There is moderate calcifications scattered throughout the aorta. Lungs/Pleura: There is a lobulated nodular lesion in the posterior segment of the right upper lobe measuring 2.4 x 2.4 x 2.0 cm. On axial slice 71 series 4, there is a 2 mm nodular opacity abutting the pleura in the inferior lingula. Lungs elsewhere clear. Note that there is slight lower lobe bronchiectatic change bilaterally. Upper abdomen: There is a lesion occupying most of the right lobe of the liver, incompletely visualized, measuring 15.8 x 12.2 cm in its visualized portions. There are areas suggesting stellate scarring within this lesion. Elsewhere in the visualized upper abdomen, there is incomplete visualization of a cyst arising from the upper to mid left kidney measuring 2.1 x 2.0 cm. There is extensive calcification in the splenic artery. Musculoskeletal: There is a right axillofemoral graft, incompletely visualized. There are no blastic or lytic bone lesions. IMPRESSION: Nodular lesion posterior segment right upper lobe measuring 2.4 x 2.4 x 2.0 cm. Mildly prominent lymph nodes anterior to the carina. Other smaller lymph nodes seen on this noncontrast enhanced study. Neoplasm is concern. These findings may warrant PET-CT to further assess. Large lesion occupying much of the right lobe of the liver. Features on this noncontrast enhanced study suggests potential focal nodular hyperplasia. MR pre and post-contrast would be the imaging study of choice to optimally assess this lesion. A neoplastic focus in the liver, particularly given the changes in the chest, must be of concern. Multiple foci of coronary artery calcification. These results will be called to the ordering clinician or representative by the Radiologist Assistant, and communication documented in the PACS or zVision Dashboard. Electronically Signed   By: Lowella Grip III M.D.   On: 09/10/2015 15:55         Assessment/Plan:  61 y.o. male with extension PMH, ESRD on HD and severe PAD with L BKA presented after having two witnessed seizures and not returning baseline. He was not given any AED's or ativan for his seizures. He was intubated for low GCS and to protect his airway. He apparently missed two days of HD this past week. Had a CT of his chest done was is found to have a new lung mass with potential mets to his liver.  New onset Seizure in the setting of new lung and liver mass, will need to rule out brain mets  - MRI brain with and without contrast - Continuous EEG - No AED's at this time depending on MRI and EEG results or if he has another event - Infectious workup,UA, CXR, BC, BAL, infection may lower seizure threshold - May require LP if spikes fever or develops signs of meningitis or all above is negative - This may all be related to missing dialysis and electrolyte disturbances

## 2015-09-11 NOTE — Progress Notes (Signed)
Patient ID: LANNIS HEIMBUCH, male   DOB: 1956-01-29, 60 y.o.   MRN: [id number] Patient seen in our clinic this afternoon (Dr. Parks Ranger) and I saw him as well. heis definitely altered from a mental status perspective. He needs inpatient admission with altered mental status.  Also, his family member tells me he had a CT scan yesterday. I looked at chart and that was done in response to abnormal CXR (done to assess dialysis catheter).   Chest CT showed this: Nodular lesion posterior segment right upper lobe measuring 2.4 x 2.4 x 2.0 cm. Mildly prominent lymph nodes anterior to the carina. Other smaller lymph nodes seen on this noncontrast enhanced study. Neoplasm is concern. These findings may warrant PET-CT to further assess.  Large lesion occupying much of the right lobe of the liver. Features on this noncontrast enhanced study suggests potential focal nodular hyperplasia. MR pre and post-contrast would be the imaging study of choice to optimally assess this lesion. A neoplastic focus in the liver, particularly given the changes in the chest, must be of Concern.   I will make inpatient aware of thse concerning new findings. I also spoke with his wife today about the admission. I did not know of the abnormal CT scan a tthe time but I will discuss with them tomorrow AM.

## 2015-09-12 ENCOUNTER — Ambulatory Visit: Payer: Medicare Other | Admitting: Family Medicine

## 2015-09-12 ENCOUNTER — Inpatient Hospital Stay (HOSPITAL_COMMUNITY): Payer: Medicare Other

## 2015-09-12 DIAGNOSIS — J9601 Acute respiratory failure with hypoxia: Secondary | ICD-10-CM

## 2015-09-12 DIAGNOSIS — K7689 Other specified diseases of liver: Secondary | ICD-10-CM

## 2015-09-12 DIAGNOSIS — I739 Peripheral vascular disease, unspecified: Secondary | ICD-10-CM

## 2015-09-12 DIAGNOSIS — K769 Liver disease, unspecified: Secondary | ICD-10-CM | POA: Insufficient documentation

## 2015-09-12 LAB — BLOOD GAS, ARTERIAL
ACID-BASE DEFICIT: 0.2 mmol/L (ref 0.0–2.0)
Acid-base deficit: 2.4 mmol/L — ABNORMAL HIGH (ref 0.0–2.0)
Bicarbonate: 23.3 mEq/L (ref 20.0–24.0)
Bicarbonate: 24.4 mEq/L — ABNORMAL HIGH (ref 20.0–24.0)
DRAWN BY: 244871
DRAWN BY: 24487
FIO2: 1
FIO2: 1
LHR: 14 {breaths}/min
MECHVT: 550 mL
MECHVT: 550 mL
O2 Saturation: 97.9 %
O2 Saturation: 99.7 %
PATIENT TEMPERATURE: 99.1
PCO2 ART: 50.7 mmHg — AB (ref 35.0–45.0)
PEEP: 5 cmH2O
PEEP: 5 cmH2O
PO2 ART: 378 mmHg — AB (ref 80.0–100.0)
PO2 ART: 132 mmHg — AB (ref 80.0–100.0)
Patient temperature: 98.6
RATE: 14 resp/min
TCO2: 25.7 mmol/L (ref 0–100)
TCO2: 24.8 mmol/L (ref 0–100)
pCO2 arterial: 42.7 mmHg (ref 35.0–45.0)
pH, Arterial: 7.285 — ABNORMAL LOW (ref 7.350–7.450)
pH, Arterial: 7.375 (ref 7.350–7.450)

## 2015-09-12 LAB — AFP TUMOR MARKER: AFP TUMOR MARKER: 2.1 ng/mL (ref 0.0–8.3)

## 2015-09-12 LAB — CBC WITH DIFFERENTIAL/PLATELET
BASOS ABS: 0 10*3/uL (ref 0.0–0.1)
BASOS PCT: 0 %
EOS PCT: 0 %
Eosinophils Absolute: 0 10*3/uL (ref 0.0–0.7)
HEMATOCRIT: 41.4 % (ref 39.0–52.0)
Hemoglobin: 12.6 g/dL — ABNORMAL LOW (ref 13.0–17.0)
LYMPHS PCT: 7 %
Lymphs Abs: 1.3 10*3/uL (ref 0.7–4.0)
MCH: 28.1 pg (ref 26.0–34.0)
MCHC: 30.4 g/dL (ref 30.0–36.0)
MCV: 92.4 fL (ref 78.0–100.0)
Monocytes Absolute: 1.5 10*3/uL — ABNORMAL HIGH (ref 0.1–1.0)
Monocytes Relative: 8 %
NEUTROS ABS: 16.2 10*3/uL — AB (ref 1.7–7.7)
Neutrophils Relative %: 85 %
PLATELETS: 135 10*3/uL — AB (ref 150–400)
RBC: 4.48 MIL/uL (ref 4.22–5.81)
RDW: 17.4 % — AB (ref 11.5–15.5)
WBC: 19 10*3/uL — AB (ref 4.0–10.5)

## 2015-09-12 LAB — URINALYSIS, ROUTINE W REFLEX MICROSCOPIC
GLUCOSE, UA: NEGATIVE mg/dL
KETONES UR: NEGATIVE mg/dL
Nitrite: NEGATIVE
PROTEIN: 100 mg/dL — AB
Specific Gravity, Urine: 1.02 (ref 1.005–1.030)
pH: 5 (ref 5.0–8.0)

## 2015-09-12 LAB — GLUCOSE, CAPILLARY
GLUCOSE-CAPILLARY: 84 mg/dL (ref 65–99)
GLUCOSE-CAPILLARY: 110 mg/dL — AB (ref 65–99)
GLUCOSE-CAPILLARY: 159 mg/dL — AB (ref 65–99)
GLUCOSE-CAPILLARY: 134 mg/dL — AB (ref 65–99)
GLUCOSE-CAPILLARY: 246 mg/dL — AB (ref 65–99)
Glucose-Capillary: 164 mg/dL — ABNORMAL HIGH (ref 65–99)

## 2015-09-12 LAB — RENAL FUNCTION PANEL
ALBUMIN: 3.7 g/dL (ref 3.5–5.0)
Anion gap: 16 — ABNORMAL HIGH (ref 5–15)
BUN: 63 mg/dL — AB (ref 6–20)
CHLORIDE: 102 mmol/L (ref 101–111)
CO2: 26 mmol/L (ref 22–32)
CREATININE: 7.7 mg/dL — AB (ref 0.61–1.24)
Calcium: 8.6 mg/dL — ABNORMAL LOW (ref 8.9–10.3)
GFR calc non Af Amer: 7 mL/min — ABNORMAL LOW (ref >60)
GFR, EST AFRICAN AMERICAN: 8 mL/min — AB (ref >60)
Glucose, Bld: 88 mg/dL (ref 65–99)
PHOSPHORUS: 6.9 mg/dL — AB (ref 2.5–4.6)
Potassium: 4.9 mmol/L (ref 3.5–5.1)
Sodium: 144 mmol/L (ref 135–145)

## 2015-09-12 LAB — HEPATIC FUNCTION PANEL
ALT: 10 U/L — ABNORMAL LOW (ref 17–63)
AST: 17 U/L (ref 15–41)
Albumin: 3.6 g/dL (ref 3.5–5.0)
Alkaline Phosphatase: 90 U/L (ref 38–126)
BILIRUBIN DIRECT: 0.2 mg/dL (ref 0.1–0.5)
BILIRUBIN INDIRECT: 0.7 mg/dL (ref 0.3–0.9)
TOTAL PROTEIN: 7 g/dL (ref 6.5–8.1)
Total Bilirubin: 0.9 mg/dL (ref 0.3–1.2)

## 2015-09-12 LAB — URINE MICROSCOPIC-ADD ON: RBC / HPF: NONE SEEN RBC/hpf (ref 0–5)

## 2015-09-12 LAB — PROTIME-INR
INR: 1.28 (ref 0.00–1.49)
Prothrombin Time: 16.1 seconds — ABNORMAL HIGH (ref 11.6–15.2)

## 2015-09-12 LAB — TRIGLYCERIDES: Triglycerides: 148 mg/dL (ref <150)

## 2015-09-12 LAB — MRSA PCR SCREENING: MRSA BY PCR: NEGATIVE

## 2015-09-12 LAB — LACTIC ACID, PLASMA: LACTIC ACID, VENOUS: 1.1 mmol/L (ref 0.5–2.0)

## 2015-09-12 MED ORDER — INSULIN GLARGINE 100 UNIT/ML ~~LOC~~ SOLN
5.0000 [IU] | Freq: Every day | SUBCUTANEOUS | Status: DC
  Administered 2015-09-12 – 2015-09-13 (×2): 5 [IU] via SUBCUTANEOUS
  Filled 2015-09-12 (×3): qty 0.05

## 2015-09-12 MED ORDER — INSULIN ASPART 100 UNIT/ML ~~LOC~~ SOLN
0.0000 [IU] | SUBCUTANEOUS | Status: DC
  Administered 2015-09-12: 8 [IU] via SUBCUTANEOUS
  Administered 2015-09-13: 3 [IU] via SUBCUTANEOUS
  Administered 2015-09-13 – 2015-09-15 (×4): 2 [IU] via SUBCUTANEOUS
  Administered 2015-09-16: 3 [IU] via SUBCUTANEOUS
  Administered 2015-09-16 – 2015-09-17 (×2): 2 [IU] via SUBCUTANEOUS

## 2015-09-12 MED ORDER — LIDOCAINE-PRILOCAINE 2.5-2.5 % EX CREA
1.0000 "application " | TOPICAL_CREAM | CUTANEOUS | Status: DC | PRN
  Filled 2015-09-12: qty 5

## 2015-09-12 MED ORDER — IOHEXOL 300 MG/ML  SOLN: 25.0000 mL | INTRAMUSCULAR | Status: AC

## 2015-09-12 MED ORDER — HEPARIN SODIUM (PORCINE) 1000 UNIT/ML DIALYSIS: 4000.0000 [IU] | INTRAMUSCULAR | Status: DC | PRN

## 2015-09-12 MED ORDER — HEPARIN SODIUM (PORCINE) 1000 UNIT/ML DIALYSIS: 1000.0000 [IU] | INTRAMUSCULAR | Status: DC | PRN

## 2015-09-12 MED ORDER — IPRATROPIUM-ALBUTEROL 0.5-2.5 (3) MG/3ML IN SOLN
3.0000 mL | Freq: Four times a day (QID) | RESPIRATORY_TRACT | Status: DC
  Administered 2015-09-12 – 2015-09-17 (×20): 3 mL via RESPIRATORY_TRACT
  Filled 2015-09-12 (×20): qty 3

## 2015-09-12 MED ORDER — LIDOCAINE HCL (PF) 1 % IJ SOLN: 5.0000 mL | INTRAMUSCULAR | Status: DC | PRN

## 2015-09-12 MED ORDER — VANCOMYCIN HCL IN DEXTROSE 750-5 MG/150ML-% IV SOLN
750.0000 mg | INTRAVENOUS | Status: DC | PRN
  Filled 2015-09-12: qty 150

## 2015-09-12 MED ORDER — HEPARIN SODIUM (PORCINE) 5000 UNIT/ML IJ SOLN
5000.0000 [IU] | Freq: Three times a day (TID) | INTRAMUSCULAR | Status: DC
  Administered 2015-09-12 – 2015-09-17 (×16): 5000 [IU] via SUBCUTANEOUS
  Filled 2015-09-12 (×19): qty 1

## 2015-09-12 MED ORDER — SODIUM CHLORIDE 0.9 % IV SOLN
2000.0000 mg | Freq: Once | INTRAVENOUS | Status: AC
  Administered 2015-09-12: 2000 mg via INTRAVENOUS
  Filled 2015-09-12: qty 20

## 2015-09-12 MED ORDER — LIDOCAINE-PRILOCAINE 2.5-2.5 % EX CREA: 1.0000 "application " | TOPICAL_CREAM | CUTANEOUS | Status: DC | PRN

## 2015-09-12 MED ORDER — ACETAMINOPHEN 160 MG/5ML PO SOLN
650.0000 mg | Freq: Four times a day (QID) | ORAL | Status: DC | PRN
  Administered 2015-09-12 – 2015-09-16 (×3): 650 mg
  Filled 2015-09-12 (×4): qty 20.3

## 2015-09-12 MED ORDER — VANCOMYCIN HCL IN DEXTROSE 750-5 MG/150ML-% IV SOLN
750.0000 mg | Freq: Once | INTRAVENOUS | Status: AC
  Administered 2015-09-12: 750 mg via INTRAVENOUS
  Filled 2015-09-12: qty 150

## 2015-09-12 MED ORDER — DEXTROSE 5 % IV SOLN
1500.0000 mg | Freq: Once | INTRAVENOUS | Status: AC
  Administered 2015-09-12: 1500 mg via INTRAVENOUS
  Filled 2015-09-12: qty 15

## 2015-09-12 MED ORDER — ALTEPLASE 2 MG IJ SOLR
2.0000 mg | Freq: Once | INTRAMUSCULAR | Status: DC | PRN
  Filled 2015-09-12: qty 2

## 2015-09-12 MED ORDER — BUDESONIDE 0.5 MG/2ML IN SUSP
0.5000 mg | Freq: Two times a day (BID) | RESPIRATORY_TRACT | Status: DC
  Administered 2015-09-12 – 2015-09-18 (×12): 0.5 mg via RESPIRATORY_TRACT
  Filled 2015-09-12 (×13): qty 2

## 2015-09-12 MED ORDER — SODIUM CHLORIDE 0.9 % IV SOLN
500.0000 mg | Freq: Two times a day (BID) | INTRAVENOUS | Status: DC
  Filled 2015-09-12: qty 5

## 2015-09-12 MED ORDER — PENTAFLUOROPROP-TETRAFLUOROETH EX AERO: 1.0000 "application " | INHALATION_SPRAY | CUTANEOUS | Status: DC | PRN

## 2015-09-12 MED ORDER — SODIUM CHLORIDE 0.9 % IV SOLN: 200.0000 mg | Freq: Once | INTRAVENOUS | Status: DC

## 2015-09-12 MED ORDER — COLLAGENASE 250 UNIT/GM EX OINT
TOPICAL_OINTMENT | Freq: Every day | CUTANEOUS | Status: DC
  Administered 2015-09-12 – 2015-09-14 (×3): via TOPICAL
  Administered 2015-09-15: 1 via TOPICAL
  Administered 2015-09-16 – 2015-09-18 (×3): via TOPICAL
  Filled 2015-09-12 (×3): qty 30

## 2015-09-12 MED ORDER — SODIUM CHLORIDE 0.9 % IV SOLN: 100.0000 mL | INTRAVENOUS | Status: DC | PRN

## 2015-09-12 MED ORDER — HEPARIN SODIUM (PORCINE) 1000 UNIT/ML DIALYSIS: 3500.0000 [IU] | INTRAMUSCULAR | Status: DC | PRN

## 2015-09-12 MED ORDER — SODIUM CHLORIDE 0.9 % IV SOLN
200.0000 mg | Freq: Two times a day (BID) | INTRAVENOUS | Status: DC
  Administered 2015-09-12 – 2015-09-20 (×14): 200 mg via INTRAVENOUS
  Filled 2015-09-12 (×28): qty 20

## 2015-09-12 MED ORDER — PRO-STAT SUGAR FREE PO LIQD
30.0000 mL | Freq: Four times a day (QID) | ORAL | Status: DC
  Administered 2015-09-12 – 2015-09-17 (×20): 30 mL
  Filled 2015-09-12 (×23): qty 30

## 2015-09-12 MED ORDER — SODIUM CHLORIDE 0.9 % IV SOLN
1500.0000 mg | Freq: Once | INTRAVENOUS | Status: AC
  Administered 2015-09-12: 1500 mg via INTRAVENOUS
  Filled 2015-09-12: qty 30

## 2015-09-12 MED ORDER — ALTEPLASE 2 MG IJ SOLR: 2.0000 mg | Freq: Once | INTRAMUSCULAR | Status: DC | PRN

## 2015-09-12 MED ORDER — NEPRO/CARBSTEADY PO LIQD
1000.0000 mL | ORAL | Status: DC
  Administered 2015-09-12 – 2015-09-16 (×6): 1000 mL
  Filled 2015-09-12 (×8): qty 1000

## 2015-09-12 MED ORDER — VALPROATE SODIUM 500 MG/5ML IV SOLN
250.0000 mg | Freq: Four times a day (QID) | INTRAVENOUS | Status: DC
  Administered 2015-09-12 – 2015-09-13 (×4): 250 mg via INTRAVENOUS
  Filled 2015-09-12 (×7): qty 2.5

## 2015-09-12 NOTE — Consult Note (Addendum)
Renal Service Consult Note [place] 09/12/2015 [place] D Requesting Physician:  Dr McDiarmid  Reason for Consult:  ESRD pt w AMS HPI: The patient is a 60 y.o. 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 Was here post BKA at [facility] in Feb 2017 and was dc'd on 07/24/15.  Patient was brought to ED yesterday with AMS.  Had missed HD last thurs and Saturday, rec'd 2 h HD yesterday but ended early due to clotting off. AMS x 1 day per family.  While in CT pt had a brief seizure and another on arrival to the floor.  Short seizures < 1 min.  CT head showed old CVA's.  CT chest showed RUL nodule and large lesion R lobe of the liver.  PCCM called and pt was intubated.  Now has continuous EEG monitoring.    ROS  denies CP  no joint pain   no HA  no blurry vision  no rash  no diarrhea  no nausea/ vomiting  no dysuria  no difficulty voiding  no change in urine color    Past Medical History  Past Medical History  Diagnosis Date  . COPD (chronic obstructive pulmonary disease) ([place])   . Chronic back pain     Chronic narcotics for this for many years  . Hyperlipidemia   . Foot pain   . Peripheral vascular disease ([place])   . Asthma   . Ulcer 1985  . Fracture of foot     [place]- Right  . Neuromuscular disorder ([place])     periferal neuropathy  . Chronic kidney disease     CKD stage III  . Hypertension     not taking any medication  . Stroke ([place])     No residual "Mini strokes"  . Diabetes mellitus     Type II  . Arthritis    Past Surgical History  Past Surgical History  Procedure Laterality Date  . Neck surgery      "Titatium Plate"  . Eye surgery    . Cardiac catheterization    . Endarterectomy  12/18/2011    Procedure: ENDARTERECTOMY CAROTID;  Surgeon: Angelia Mould, MD;  Location: [place];  Service: Vascular;  Laterality: Left;  . Carotid  endarterectomy Right   . Axillary-femoral bypass graft  06/18/2012    Procedure: BYPASS GRAFT AXILLA-BIFEMORAL;  Surgeon: Angelia Mould, MD;  Location: [place];  Service: Vascular;  Laterality: Right;  Right Axillo to Right Femoral Bypass Graft  . Femoral-popliteal bypass graft  06/18/2012    Procedure: BYPASS GRAFT FEMORAL-POPLITEAL ARTERY;  Surgeon: Angelia Mould, MD;  Location: [facility] OR;  Service: Vascular;  Laterality: Right;  Right Femoral to Above Knee Popliteal Bypass Graft  . Amputation  06/18/2012    Procedure: AMPUTATION DIGIT;  Surgeon: Angelia Mould, MD;  Location: [facility] OR;  Service: Vascular;  Laterality: Right;  Right side, amputation of fourth and fifth toes  . Av fistula placement  06/24/2012    Procedure: ARTERIOVENOUS (AV) FISTULA CREATION;  Surgeon: Angelia Mould, MD;  Location: [place];  Service: Vascular;  Laterality: Left;  . Lower extremity angiogram Right 11/21/2011    Procedure: LOWER EXTREMITY ANGIOGRAM;  Surgeon: Elam Dutch, MD;  Location[facility] CATH LAB;  Service: Cardiovascular;  Laterality: Right;  . Enucleation Left     trauma as  a child. Wears prosthesis.   Marland Kitchen Below knee leg amputation Left     [facility] - Dr. Sabino Dick  . Amputation toe Left 2016    2 toes  . Transmetatarsal amputation Left 2016  . Ligation of arteriovenous  fistula Left 08/27/2015    Procedure: LIGATION OF Left Arm ARTERIOVENOUS  FISTULA;  Surgeon: Rosetta Posner, MD;  Location: [place];  Service: Vascular;  Laterality: Left;  . Insertion of dialysis catheter Left 08/27/2015    Procedure: INSERTION OF Right Internal Jugular DIALYSIS CATHETER;  Surgeon: Rosetta Posner, MD;  Location: [place];  Service: Vascular;  Laterality: Left;  . Back surgery     Family History  Family History  Problem Relation Age of Onset  . Diabetes Mother   . Heart disease Mother     before age 60  . Hypertension Mother   . Heart attack Mother   . Diabetes Father     Amputation  . Heart disease  Father     Before age 31  . Hypertension Father   . Heart attack Father   . Other Father     bleeding problems  . Diabetes Brother   . Heart disease Brother     Before age 69  . Hypertension Brother   . Heart attack Brother    Social History  reports that he has been smoking Cigarettes.  He has a 8 pack-year smoking history. He has never used smokeless tobacco. He reports that he does not drink alcohol or use illicit drugs. Allergies  Allergies  Allergen Reactions  . Nicoderm [Nicotine] Itching and Rash  . Gabapentin Other (See Comments)    Can't walk, shuts legs down  . Vicodin [Hydrocodone-Acetaminophen] Hives and Itching   Home medications Prior to Admission medications   Medication Sig Start Date End Date Taking? Authorizing Provider  aspirin 81 MG chewable tablet Chew 81 mg by mouth daily.     Historical Provider, MD  atorvastatin (LIPITOR) 40 MG tablet Take 40 mg by mouth every evening.    Historical Provider, MD  cephALEXin (KEFLEX) 500 MG capsule Take 1 capsule (500 mg total) by mouth daily. 09/05/15   Ankit Lorie Phenix, MD  cholecalciferol (VITAMIN D) 1000 UNITS tablet Take 1,000 Units by mouth daily.    Historical Provider, MD  collagenase (SANTYL) ointment Apply 1 application topically daily. Please apply to left popliteal fossa 08/08/15   Ankit Lorie Phenix, MD  collagenase (SANTYL) ointment Apply 1 application topically daily. 08/09/15   [name], MD  fentaNYL (DURAGESIC - DOSED MCG/HR) 12 MCG/HR Place 1 patch (12.5 mcg total) onto the skin every 3 (three) days. Change patch tomorrow 07/24/15   Ivan Anchors Love, PA-C  hydrocerin (EUCERIN) CREA Apply 1 application topically 2 (two) times daily. Patient taking differently: Apply 1 application topically daily.  07/24/15   Ivan Anchors Love, PA-C  insulin glargine (LANTUS) 100 UNIT/ML injection Inject 0.05 mLs (5 Units total) into the skin daily. Patient taking differently: Inject 5 Units into the skin daily.  07/24/15   Ivan Anchors Love,  PA-C  insulin lispro (HUMALOG) 100 UNIT/ML injection Inject 2-3 Units into the skin 2 (two) times daily as needed for high blood sugar (CBG >180). Per sliding scale: inject 2 units subcutaneously for CBG 180-299, inject 3 units for CBG >300 08/13/10   Zenia Resides, MD  lanthanum (FOSRENOL) 1000 MG chewable tablet Chew 1,000 mg by mouth See admin instructions. Take 1 tablet (1000 mg) by  mouth with large meals (once every 2-3 days)    Historical Provider, MD  lidocaine-EPINEPHrine-tetracaine (LET) GEL Apply topically to left arm 1 hour before dialysis 03/20/14   Vivi Barrack, MD  lidocaine-prilocaine (EMLA) cream Apply 1 application topically 3 (three) times a week. Reported on 08/23/2015    Historical Provider, MD  methocarbamol (ROBAXIN) 500 MG tablet Take 1 tablet (500 mg total) by mouth every 6 (six) hours as needed for muscle spasms. 07/24/15   Bary Leriche, PA-C  multivitamin (RENA-VIT) TABS tablet Take 1 tablet by mouth daily.  04/12/14   Historical Provider, MD  oxyCODONE-acetaminophen (PERCOCET) 10-325 MG tablet Take one by mouth  Every 6 hours prn pain do not fill before September 14 2015 08/22/15   [name], MD  PROAIR HFA [phone] Base) MCG/ACT inhaler INHALE 2 PUFFS BY MOUTH EVERY 4 HOURS AS NEEDED FOR WHEEZING OR SHORTNESS OF BREATH 08/10/15   [name], MD   Liver Function Tests  Recent Labs Lab 09/12/2015 2009 09/12/15 0438 09/12/15 1029  AST 25  --  17  ALT 12*  --  10*  ALKPHOS 104  --  90  BILITOT 0.6  --  0.9  PROT 7.6  --  7.0  ALBUMIN 3.9 3.7 3.6   No results for input(s): LIPASE, AMYLASE in the last 168 hours. CBC  Recent Labs Lab 09/21/2015 2009 09/12/15 0438  WBC 11.9* 19.0*  NEUTROABS 9.1* 16.2*  HGB 12.8* 12.6*  HCT 42.3 41.4  MCV 92.8 92.4  PLT 137* [id number]*   Basic Metabolic Panel  Recent Labs Lab 10/04/2015 1943 09/09/2015 2009 09/12/15 0438  NA  --  140 144  K  --  4.4 4.9  CL  --  99* 102  CO2  --  22 26  GLUCOSE  --  129* 88  BUN  --  55* 63*   CREATININE  --  7.40* 7.70*  CALCIUM  --  9.0 8.6*  PHOS 5.7*  --  6.9*    Filed Vitals:   09/12/15 1100 09/12/15 1125 09/12/15 1200 09/12/15 1201  BP: 151/77  154/74 149/74  Pulse: 100  102 98  Temp:  99 F (37.2 C)    TempSrc:  Oral    Resp: 14  14 14   Height:      Weight:      SpO2: 100%  100% 100%   Exam On vent, sedated, intubated No rash, cyanosis or gangrene Sclera anicteric, throat w ETT in place  No jvd or bruits Chest clear bilat ant/ lat RRR no MRG Abd soft ntnd no mass or ascites +bs GU normal male, foley in place MS L BKA/ R 4/5 toe amps Ext no LE edema / no wounds or ulcers Neuro is not responding, on vent R IJ TDC, left UE AVF no bruit  CXRnegative CT chest  RUL lobulated mass, o/w clear  Dialysis: TTS [facility]   4h  2/2 bath  R IJ cath  73kg  Hep 7500 Calcitriol 1.25 ug tiw   Assessment: 1. AMS/ seizures - on diprivan and depakote IV. Was on robaxin at home after [facility] stay, this can cause severe side effects in ESRD pts. Not sure if he was taking it, was prn.  Plan per primary/ neuro.  2. Fever/ [place] - empiric Zosyn 3. Lung mass 4. Liver lesion by CT 5. ESRD missed HD x 2 last week 6. Volume - no vol excess on exam, CXR clear 7. DM on insulin  8. MBD of CKD get records 9. Anemia of CKD - Hb 12, no esa needed, follow   Plan - HD today and again tomorrow.   Kelly Splinter MD [employer] pager [phone]    cell [phone] 09/12/2015, 1:02 PM

## 2015-09-12 NOTE — Progress Notes (Signed)
Initial Nutrition Assessment  DOCUMENTATION CODES:   Non-severe (moderate) malnutrition in context of chronic illness  INTERVENTION:    Initiate TF via OGT with Nepro at goal rate of 35 ml/h (840 ml/day) and Prostat 30 ml QID to provide 1912 kcals, 128 gm protein, 611 ml free water daily.  NUTRITION DIAGNOSIS:   Inadequate oral intake related to inability to eat as evidenced by NPO status.  GOAL:   Patient will meet greater than or equal to 90% of their needs  MONITOR:   Vent status, Labs, Weight trends, Skin, I & O's  REASON FOR ASSESSMENT:   Consult Enteral/tube feeding initiation and management  ASSESSMENT:   60 y.o. 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 Was here post BKA at [facility] in Feb 2017 and was dc'd on 07/24/15. Patient was brought to ED on 4/4 with AMS. Had missed HD last thurs and Saturday, received 2 h HD yesterday but ended early due to clotting off. AMS x 1 day per family. While in CT pt had a brief seizure and another on arrival to the floor. CT head showed old CVA's. CT chest showed RUL nodule and large lesion R lobe of the liver. Patient required intubation. Now has continuous EEG monitoring.    Continuous EEG monitoring is ongoing. Nutrition-Focused physical exam completed. Findings are no fat depletion, mild-moderate muscle depletion, and no edema. Patient with 11% weight loss over the past 3 months. Received MD Consult for TF initiation and management.  Labs reviewed: phosphorus elevated at 6.9.  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  Estimated Nutritional Needs:   Kcal:  1962  Protein:  110-125 gm  Fluid:  1.2 L  EDUCATION NEEDS:   No education needs identified at this time  Molli Barrows, [place], [place], [place] Pager [phone] After Hours Pager [phone]

## 2015-09-12 NOTE — Procedures (Addendum)
  Electroencephalogram report- LTM   Data acquisition: 10-20 electrode placement.  Additional T1, T2, and EKG electrodes; 26 channel digital referential acquisition reformatted to 18 channel/7 channel coronal bipolar     Beginning time: 09/12/15 01;32 55 am Ending time: 4/ 5/ 17  07 22 05 am  Day of study: day 1   This 6 hours of intensive EEG monitoring with simultaneous video monitoring was performed for this patient with convulsions.  Medications: intubated and sedated with propofol  During first hour of the recording the Background activities were marked by  broad 1-2 cps spikes and polyspike/wave discharges with posterior dominance. This pattern changed to more focal and continuous electrographic seizures arising from right occipital cortex. During last portion of the recoding background become more attenuated with superimposed broad periodic sharp waves alternating with runs of more continuouse delta and theta slowing . Within these theta runs, there is rhythmic 5-6 cps right post temporal rhythm present.    Clinical interpretation: This 6 hours of intensive EEG monitoring with simultaneous monitoring c/w resolution of  status epilepticus.  However  Some degree of cortical irritability is still present mostly in posterior right parietal/occipital  cortex.

## 2015-09-12 NOTE — Progress Notes (Signed)
PULMONARY / CRITICAL CARE MEDICINE   Name: John [name] MRN: [id number] DOB: 01/15/1956    ADMISSION DATE:  09/16/2015 CONSULTATION DATE:  09/28/2015  REFERRING MD:  Family Med Teaching Service  CHIEF COMPLAINT:  Acute encephalopathy/Seizures  HISTORY OF PRESENT ILLNESS:   Mr. Demirjian is a 60 year old man with a past medical history significant for ESRD on HD TTS, Insulin Dependent Type 2 DM, PAD s/p left BKA, COPD, CKD Stage III, Stroke/TIAs presented with lethargy and acute encephalopathy. Patient missed Thursday and Saturday dialysis sessions (prior notes say transportation issues, family reports clotting at dialysis). Did receive 2 hours of dialysis today but ended early due to clotting off. Family reports first noticing a change in mental status yesterday with no improvement after dialysis today. Patient was seen in [facility] today and was able to converse with them and answer questions but was very confused. He was sent for direct admission from clinic. He had a brief seizure while in CT scan on arrival with another seizure once he arrived to the floor. Did not receive any medications as the seizures lasted <1 minute. Mental status has deteriorated and patient is now somnolent and only responds to painful stimuli. No history of EOTH use or cirrhosis.   Found to have new RUL lung nodule with a large right lobe liver lesion. CT of his head shows old infarcts. Patient's mental status has not improved following the seizures and PCCM was called to evaluate the patient.  PAST MEDICAL HISTORY :  He  has a past medical history of COPD (chronic obstructive pulmonary disease) ([place]); Chronic back pain; Hyperlipidemia; Foot pain; Peripheral vascular disease ([place]); Asthma; Ulcer (1985); Fracture of foot; Neuromuscular disorder ([place]); Chronic kidney disease; Hypertension; Stroke [facility]); Diabetes mellitus; and Arthritis.  PAST SURGICAL HISTORY: He  has past surgical history that includes Neck surgery; Eye  surgery; Cardiac catheterization; Endarterectomy (12/18/2011); Carotid endarterectomy (Right); Axillary-femoral Bypass Graft (06/18/2012); Femoral-popliteal Bypass Graft (06/18/2012); Amputation (06/18/2012); AV fistula placement (06/24/2012); lower extremity angiogram (Right, 11/21/2011); Enucleation (Left); Below knee leg amputation (Left); Amputation toe (Left, 2016); Transmetatarsal amputation (Left, 2016); Ligation of arteriovenous  fistula (Left, 08/27/2015); Insertion of dialysis catheter (Left, 08/27/2015); and Back surgery.  Allergies  Allergen Reactions  . Nicoderm [Nicotine] Itching and Rash  . Gabapentin Other (See Comments)    Can't walk, shuts legs down  . Vicodin [Hydrocodone-Acetaminophen] Hives and Itching    Current Facility-Administered Medications on File Prior to Encounter  Medication  . ferumoxytol (FERAHEME) 1,020 mg in sodium chloride 0.9 % 100 mL IVPB   Current Outpatient Prescriptions on File Prior to Encounter  Medication Sig  . aspirin 81 MG chewable tablet Chew 81 mg by mouth daily.   Marland Kitchen atorvastatin (LIPITOR) 40 MG tablet Take 40 mg by mouth every evening.  . cephALEXin (KEFLEX) 500 MG capsule Take 1 capsule (500 mg total) by mouth daily.  . cholecalciferol (VITAMIN D) 1000 UNITS tablet Take 1,000 Units by mouth daily.  . collagenase (SANTYL) ointment Apply 1 application topically daily. Please apply to left popliteal fossa  . collagenase (SANTYL) ointment Apply 1 application topically daily.  . fentaNYL (DURAGESIC - DOSED MCG/HR) 12 MCG/HR Place 1 patch (12.5 mcg total) onto the skin every 3 (three) days. Change patch tomorrow  . hydrocerin (EUCERIN) CREA Apply 1 application topically 2 (two) times daily. (Patient taking differently: Apply 1 application topically daily. )  . insulin glargine (LANTUS) 100 UNIT/ML injection Inject 0.05 mLs (5 Units total) into the skin daily. (  Patient taking differently: Inject 5 Units into the skin daily. )  . insulin lispro (HUMALOG)  100 UNIT/ML injection Inject 2-3 Units into the skin 2 (two) times daily as needed for high blood sugar (CBG >180). Per sliding scale: inject 2 units subcutaneously for CBG 180-299, inject 3 units for CBG >300  . lanthanum (FOSRENOL) 1000 MG chewable tablet Chew 1,000 mg by mouth See admin instructions. Take 1 tablet (1000 mg) by mouth with large meals (once every 2-3 days)  . lidocaine-EPINEPHrine-tetracaine (LET) GEL Apply topically to left arm 1 hour before dialysis  . lidocaine-prilocaine (EMLA) cream Apply 1 application topically 3 (three) times a week. Reported on 08/23/2015  . methocarbamol (ROBAXIN) 500 MG tablet Take 1 tablet (500 mg total) by mouth every 6 (six) hours as needed for muscle spasms.  . multivitamin (RENA-VIT) TABS tablet Take 1 tablet by mouth daily.   Marland Kitchen oxyCODONE-acetaminophen (PERCOCET) 10-325 MG tablet Take one by mouth  Every 6 hours prn pain do not fill before September 14 2015  . PROAIR HFA 108 (90 Base) MCG/ACT inhaler INHALE 2 PUFFS BY MOUTH EVERY 4 HOURS AS NEEDED FOR WHEEZING OR SHORTNESS OF BREATH  . [DISCONTINUED] tiotropium (SPIRIVA HANDIHALER) 18 MCG inhalation capsule Place 1 capsule (18 mcg total) into inhaler and inhale daily.    FAMILY HISTORY:  His indicated that his mother is deceased. He indicated that his father is deceased. He indicated that his brother is alive.   SOCIAL HISTORY: He  reports that he has been smoking Cigarettes.  He has a 8 pack-year smoking history. He has never used smokeless tobacco. He reports that he does not drink alcohol or use illicit drugs.  REVIEW OF SYSTEMS:   Not performed due to patient being intubated  SUBJECTIVE:  Patient is sedated and intubated on a ventilator.  VITAL SIGNS: BP 114/69 mmHg  Pulse 92  Temp(Src) 99.1 F (37.3 C) (Oral)  Resp 18  Ht 5\' 8"  (1.727 m)  Wt 161 lb 2.5 oz (73.1 kg)  BMI 24.51 kg/m2  SpO2 100%  HEMODYNAMICS:    VENTILATOR SETTINGS: Vent Mode:  [-]  FiO2 (%):  [100 %] 100 % Set  Rate:  [14 bmp] 14 bmp Vt Set:  [550 mL] 550 mL PEEP:  [5 cmH20] 5 cmH20 Plateau Pressure:  [18 cmH20-22 cmH20] 18 cmH20  INTAKE / OUTPUT: I/O last 3 completed shifts: In: 1065.4 [P.O.:60; I.V.:125.4; Other:110; IV Piggyback:770] Out: 730 [Urine:230; Emesis/NG output:500]  PHYSICAL EXAMINATION: General:  Ill-appearing man, lying in bed, sedated, intubated Neuro:  Not responsive to verbal stimuli, getting EEG done HEENT:  Prosthetic LEFT eye, RIGHT pupil PERRL Cardiovascular:  RRR, no m/r/g Lungs:  Ventilator assisted breaths CTAB anteriorly Abdomen:  Soft, non-tender, BS+ Skin:  Left BKA, anterior left knee with unstageable 4x2 cm scabbed wound with no odor/drainage.  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  Present prior to admission.  Left hand with dry gangrenous changes to 3rd and 5th digits.  Left AV fistula with palpable thrill, no erythema.  Right chest wall with bandage over HD catheter site.  LABS:  BMET  Recent Labs Lab 09/13/2015 2009 09/12/15 0438  NA 140 144  K 4.4 4.9  CL 99* 102  CO2 22 26  BUN 55* 63*  CREATININE 7.40* 7.70*  GLUCOSE 129* 88    Electrolytes  Recent Labs Lab 09/13/2015 1943 10/05/2015 2009 09/12/15 0438  CALCIUM  --  9.0 8.6*  MG  --  2.2  --   PHOS  5.7*  --  6.9*    CBC  Recent Labs Lab 09/24/2015 2009 09/12/15 0438  WBC 11.9* 19.0*  HGB 12.8* 12.6*  HCT 42.3 41.4  PLT 137* 135*    Coag's No results for input(s): APTT, INR in the last 168 hours.  Sepsis Markers  Recent Labs Lab 09/23/2015 1952  LATICACIDVEN 6.0*    ABG  Recent Labs Lab 09/12/15 0038 09/12/15 0453  PHART 7.285* 7.375  PCO2ART 50.7* 42.7  PO2ART 132* 378*    Liver Enzymes  Recent Labs Lab 09/14/2015 2009 09/12/15 0438  AST 25  --   ALT 12*  --   ALKPHOS 104  --   BILITOT 0.6  --   ALBUMIN 3.9 3.7    Cardiac Enzymes  Recent Labs Lab 09/10/2015 1943  TROPONINI 0.15*    Glucose  Recent Labs Lab 09/19/2015 1819 10/03/2015 1920  09/17/2015 1956 09/12/15 09/12/15 0432 09/12/15 0740  GLUCAP 88 93 131* 164* 84 110*    Imaging Dg Chest 1 View  09/10/2015  CLINICAL DATA:  Acute encephalopathy today. Headache and possible seizure today. EXAM: CHEST 1 VIEW COMPARISON:  CT chest 09/10/2015. Single view of the chest 08/27/2015. FINDINGS: Dialysis catheter remains in place. Lung volumes are low with mild basilar atelectasis. Nodule in the right upper lobe is identified as seen on the prior exams. Cardiac silhouette is accentuated by low lung volumes. IMPRESSION: No acute disease. Right upper lobe pulmonary nodule seen on prior exams. Electronically Signed   By: Inge Rise M.D.   On: 09/13/2015 19:06   Ct Head Wo Contrast  09/14/2015  CLINICAL DATA:  60 year old presenting with acute encephalopathy. EXAM: CT HEAD WITHOUT CONTRAST TECHNIQUE: Contiguous axial images were obtained from the base of the skull through the vertex without intravenous contrast. COMPARISON:  None. FINDINGS: Motion blurred several of the images but the study appears diagnostic. Ventricular system normal in size and appearance for age. No significant atrophy for age. Low attenuation in the right basal ganglia localizing to the anterior limb of the internal capsule. Mild to moderate changes of small vessel disease of the white matter diffusely. No mass lesion. No midline shift. No acute hemorrhage or hematoma. No extra-axial fluid collections. No skull fracture or other focal osseous abnormality involving the skull. Visualized paranasal sinuses, bilateral mastoid air cells and bilateral middle ear cavities well-aerated. Severe bilateral carotid siphon and vertebral artery atherosclerosis. Left globe prosthesis. IMPRESSION: 1. Age indeterminate nonhemorrhagic lacunar stroke involving the right basal ganglia localizing to the anterior limb of the right internal capsule. 2. No acute intracranial abnormality otherwise. 3. Mild to moderate chronic microvascular ischemic  changes of the white matter. 4. Severe bilateral carotid siphon and vertebral artery atherosclerosis. I reviewed these images with Dr. Rolla Flatten, neuroradiologist, who agrees with this interpretation. Electronically Signed   By: Evangeline Dakin M.D.   On: 10/05/2015 19:30   Portable Chest Xray  09/12/2015  CLINICAL DATA:  Hypoxia EXAM: PORTABLE CHEST 1 VIEW COMPARISON:  September 11, 2015 FINDINGS: Endotracheal tube tip is 4.7 cm above the carina. Nasogastric tube tip and side port are below the diaphragm. Dual-lumen central catheter tip is in the right atrium. Left jugular catheter tip is in the left innominate vein near the junction with the superior vena cava. No pneumothorax. There is no edema or consolidation. The previously noted nodular lesion in the right upper lobe is again noted, measuring 2.4 x 2.2 cm. The heart size and pulmonary vascularity are normal. No adenopathy. No demonstrable  bone lesions. IMPRESSION: Tube and catheter positions as described without pneumothorax. Stable right upper lobe nodular lesion. No edema or consolidation. Electronically Signed   By: Lowella Grip III M.D.   On: 09/12/2015 07:00   Dg Chest Port 1 View  09/12/2015  CLINICAL DATA:  Respiratory distress. New left internal jugular catheter and orogastric tube placement. EXAM: PORTABLE CHEST 1 VIEW COMPARISON:  Radiograph earlier this day at [place] hour, chest CT 09/10/2015 FINDINGS: Endotracheal tube 4.5 cm from the carina. Enteric tube in place, tip and side port below the diaphragm. Left internal jugular tip in the region of the proximal SVC/ distal brachiocephalic vein. No pneumothorax. Right-sided dialysis catheter tips at the atrial caval junction/right atrium. Mild cardiomegaly and mediastinal contours are unchanged. No pulmonary edema. Right lung nodule again seen. No new airspace disease. No large pleural effusion. IMPRESSION: 1. Endotracheal and enteric tubes in place. 2. Tip of the left internal jugular central  line in the region the proximal SVC/distal brachiocephalic vein. No pneumothorax. Electronically Signed   By: Jeb Levering M.D.   On: 10/05/2015 23:42     STUDIES:  04/04 > CXR with new RUL pulmonary nodule  04/04 > 2.4 x 2.3 x 2.0 cm RUL nodule. 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  Also with new onset seizures of unclear etiology.  PULMONARY A: Acute hypoxic hypercarbic respiratory failure likely secondary to AMS/seizure P:   - continue mechanical ventilation - CXR with no evidence of pneumonia  CARDIOVASCULAR A:  History of PAD SVT on admission with HR 180 P:  - Metoprolol 2.5-5mg  IV Q3H prn - may need vascular surgery consult regarding left hand  RENAL A:   ESRD on HD TThSat P:   - consulted nephrology - renal function panel   GASTROINTESTINAL A:   Intubated in ICU P:   - Famotidine 20mg  IV Q24H  HEMATOLOGIC A:   Liver lesion with new RUL lung nodule suspicious for metastatic neoplasm P:  - MRI brain w/o contrast pending - CT abdomen with contrast to look for other possible GI pathology.  Discussed with nephro that okay to get study with contrast  - check PT/INR, PTT  INFECTIOUS A:   Leukoctyosis, do not suspect PNA.  Patient with recent HD cath placed to right IJ on 3/20 as potential source of infection.  Is at risk for MDR organism given his multiple comorbidities Pressure ulcer of popliteal wound  infection P:   - follow up blood cx - check urinalysis, urine cx, tracheal aspirate cx - continue vanc and zosyn - trend lactic acid, CBC - WOC for pressure ulcer  ENDOCRINE A:   Insulin dependent DM2   P:   - CBG Q4H - SSI  NEUROLOGIC A:   New onset seizure unclear etiology.  Patient recently started on Keflex at renal dosing but with missed HD sessions, ?whether this contributed to seizures P:  -  Neurology consult appreciated - EEG pending - MRI brain pending - Keppra per neurology  - continue propofol, monitor triglycerides Q72H while on propofol - RASS goal: -1    FAMILY  - Updates: family updated this AM   Pulmonary and [place] Pager: [phone]  09/12/2015, 10:34 AM

## 2015-09-12 NOTE — Progress Notes (Signed)
[facility]  Patient Status: Active (receiving services up to time of hospitalization)  [facility] is providing the following services: RN  If patient discharges after hours, please call [phone].   John [name] John [name] 09/12/2015, 9:07 AM

## 2015-09-12 NOTE — Progress Notes (Signed)
Pharmacy Antibiotic Note  John [name] is a 60 y.o. male admitted on 09/08/2015 with concern for popliteal wound infection or HD access infection.  Pharmacy has been consulted for vancomycin and Zosyn dosing. Patient missed HD x2 sessions pta and only received 2hrs of session on 4/4. Vancomycin loading dose given 4/4. Now plan for HD today and tomorrow with full sessions ordered.   Plan: Continue Zosyn 2.25g IV every 8 hours.  Vancomycin 750 mg IV x1 AFTER dialysis today.  Follow-up toleration of HD today before further doses scheduled tomorrow.   Height: 5\' 8"  (172.7 cm) Weight: 161 lb 2.5 oz (73.1 kg) IBW/kg (Calculated) : 68.4  Temp (24hrs), Avg:99.4 F (37.4 C), Min:97.9 F (36.6 C), Max:101.8 F (38.8 C)   Recent Labs Lab 10/01/2015 1952 09/26/2015 2009 09/12/15 0438 09/12/15 1029  WBC  --  11.9* 19.0*  --   CREATININE  --  7.40* 7.70*  --   LATICACIDVEN 6.0*  --   --  1.1    Estimated Creatinine Clearance: 9.9 mL/min (by C-G formula based on Cr of 7.7).    Allergies  Allergen Reactions  . Nicoderm [Nicotine] Itching and Rash  . Gabapentin Other (See Comments)    Can't walk, shuts legs down  . Vicodin [Hydrocodone-Acetaminophen] Hives and Itching    Antimicrobials this admission: 4/4 Zosyn >> 4/4 vancomycin >>  Dose adjustments this admission: n/a  Microbiology results: 4/4 MRSA PCR negative 4/4 Blood >> 4/4 Urine >>  Thank you for allowing pharmacy to be a part of this patient's care.  Sloan Leiter, PharmD, BCPS Clinical Pharmacist [phone]  09/12/2015 2:32 PM

## 2015-09-12 NOTE — Procedures (Signed)
  I was present at this dialysis session, have reviewed the session itself and made  appropriate changes Kelly Splinter MD [place] pager [phone]    cell [phone] 09/12/2015, 4:57 PM

## 2015-09-12 NOTE — Progress Notes (Addendum)
Family Medicine Attending Note: I am PCP for John [name]. I appreciate ICU, renal and ID teams care. I have been updating his wife, [name]. Please contact me via cell if there is an acute worsening. My personal cell is [phone] and it is always on. I also take texts.  Chart review shows MR of abdomen and pelvis done in 2009 showed apparent large liver hemangioma consistent in location  with current liver mass.  [name] Pager [phone]

## 2015-09-12 NOTE — Consult Note (Addendum)
WOC wound consult note Reason for Consult: Consult requested for left knee. Pt is critically ill and has multiple systemic factors which can impair healing. Wound type: Left anterior knee is unstageable; 4X2cm, 90% tightly adhered slough, 10% red and dry.  No odor or drainage. Left posterior knee (popliteal area) is stage 4 pressure injury; 3X2X.2cm, 80% yellow, 5% exposed tendon, 5% red, small amt yellow drainage, no odor Pressure Ulcer POA: Yes Dressing procedure/placement/frequency: Pt previously had Santyl ordered, according to the EMR.  Continue this plan of care for chemical debridement of nonviable tissue. Please re-consult if further assistance is needed.  Thank-you,  Julien Girt MSN, [place], [place], [place], [place]

## 2015-09-12 NOTE — Procedures (Signed)
Central Venous Catheter Insertion Procedure Note NIO GUTTILLA [id number] [birth year]  Procedure: Insertion of Central Venous Catheter Indications: Assessment of intravascular volume  Procedure Details Consent: Unable to obtain consent because of altered level of consciousness. Time Out: Verified patient identification, verified procedure, site/side was marked, verified correct patient position, special equipment/implants available, medications/allergies/relevent history reviewed, required imaging and test results available.  Performed  Maximum sterile technique was used including antiseptics, cap, gloves, gown, hand hygiene, mask and sheet. Skin prep: Chlorhexidine; local anesthetic administered A antimicrobial bonded/coated triple lumen catheter was placed in the left internal jugular vein using the Seldinger technique. Ultrasound guidance used.Yes.   Catheter placed to 20 cm. Blood aspirated via all 3 ports and then flushed x 3. Line sutured x 2 and dressing applied.  Evaluation Blood flow good Complications: No apparent complications Patient did tolerate procedure well. Chest X-ray ordered to verify placement.  CXR: normal.  Georgann Housekeeper, AGACNP-BC [facility] Pulmonology/Critical Care Pager [phone] or [phone]  09/12/2015 10:50 PM

## 2015-09-12 NOTE — Progress Notes (Signed)
Interval History:                                                                                                                      John [name] is an 60 y.o. male patient with  with status epilepticus on EEG, currently showing GPEDs with background suppression, likely postictal encephalopathy. His sedation with propofol was turned off this morning. After that the background activity has improved with intermittent periods of suppression lasting 3-4 seconds once every 10-15 seconds.  He was given a loading dose of Keppra 2 g last night. Given his end-stage renal failure, on hemodialysis, and discontinued Keppra. Was given a loading dose of Depacon 1 g this morning, followed by maintenance dose of 250 mg every 6 hours. We'll check Depakote level, morning. Towards late evening, EEG continued to continued to show bi-PLEDs, with improved background activity while off sedation with propofol , and intermittent periods of by Suppression.     Past Medical History: Past Medical History  Diagnosis Date  . COPD (chronic obstructive pulmonary disease) ([place])   . Chronic back pain     Chronic narcotics for this for many years  . Hyperlipidemia   . Foot pain   . Peripheral vascular disease ([place])   . Asthma   . Ulcer 1985  . Fracture of foot     [place]- Right  . Neuromuscular disorder ([place])     periferal neuropathy  . Chronic kidney disease     CKD stage III  . Hypertension     not taking any medication  . Stroke ([place])     No residual "Mini strokes"  . Diabetes mellitus     Type II  . Arthritis     Past Surgical History  Procedure Laterality Date  . Neck surgery      "Titatium Plate"  . Eye surgery    . Cardiac catheterization    . Endarterectomy  12/18/2011    Procedure: ENDARTERECTOMY CAROTID;  Surgeon: Angelia Mould, MD;  Location: [place];  Service: Vascular;  Laterality: Left;  . Carotid endarterectomy Right   . Axillary-femoral bypass graft  06/18/2012    Procedure: BYPASS  GRAFT AXILLA-BIFEMORAL;  Surgeon: Angelia Mould, MD;  Location: [facility];  Service: Vascular;  Laterality: Right;  Right Axillo to Right Femoral Bypass Graft  . Femoral-popliteal bypass graft  06/18/2012    Procedure: BYPASS GRAFT FEMORAL-POPLITEAL ARTERY;  Surgeon: Angelia Mould, MD;  Location: [facility] OR;  Service: Vascular;  Laterality: Right;  Right Femoral to Above Knee Popliteal Bypass Graft  . Amputation  06/18/2012    Procedure: AMPUTATION DIGIT;  Surgeon: Angelia Mould, MD;  Location: [facility] OR;  Service: Vascular;  Laterality: Right;  Right side, amputation of fourth and fifth toes  . Av fistula placement  06/24/2012    Procedure: ARTERIOVENOUS (AV) FISTULA CREATION;  Surgeon: Angelia Mould, MD;  Location: [place];  Service: Vascular;  Laterality: Left;  . Lower extremity angiogram Right 11/21/2011  Procedure: LOWER EXTREMITY ANGIOGRAM;  Surgeon: Elam Dutch, MD;  Location: [facility] CATH LAB;  Service: Cardiovascular;  Laterality: Right;  . Enucleation Left     trauma as a child. Wears prosthesis.   Marland Kitchen Below knee leg amputation Left     [facility] - Dr. Sabino Dick  . Amputation toe Left 2016    2 toes  . Transmetatarsal amputation Left 2016  . Ligation of arteriovenous  fistula Left 08/27/2015    Procedure: LIGATION OF Left Arm ARTERIOVENOUS  FISTULA;  Surgeon: Rosetta Posner, MD;  Location: [place];  Service: Vascular;  Laterality: Left;  . Insertion of dialysis catheter Left 08/27/2015    Procedure: INSERTION OF Right Internal Jugular DIALYSIS CATHETER;  Surgeon: Rosetta Posner, MD;  Location: [place];  Service: Vascular;  Laterality: Left;  . Back surgery      Family History: Family History  Problem Relation Age of Onset  . Diabetes Mother   . Heart disease Mother     before age 94  . Hypertension Mother   . Heart attack Mother   . Diabetes Father     Amputation  . Heart disease Father     Before age 71  . Hypertension Father   . Heart attack Father   . Other  Father     bleeding problems  . Diabetes Brother   . Heart disease Brother     Before age 52  . Hypertension Brother   . Heart attack Brother     Social History:   reports that he has been smoking Cigarettes.  He has a 8 pack-year smoking history. He has never used smokeless tobacco. He reports that he does not drink alcohol or use illicit drugs.  Allergies:  Allergies  Allergen Reactions  . Nicoderm [Nicotine] Itching and Rash  . Gabapentin Other (See Comments)    Can't walk, shuts legs down  . Vicodin [Hydrocodone-Acetaminophen] Hives and Itching     Medications:                                                                                                                         Current facility-administered medications:  .  0.9 %  sodium chloride infusion, 100 mL, Intravenous, PRN, Roney Jaffe, MD .  0.9 %  sodium chloride infusion, 100 mL, Intravenous, PRN, Roney Jaffe, MD .  acetaminophen (TYLENOL) solution 650 mg, 650 mg, Per Tube, Q6H PRN, Raylene Miyamoto, MD, 650 mg at 09/12/15 0506 .  alteplase (CATHFLO ACTIVASE) injection 2 mg, 2 mg, Intracatheter, Once PRN, Roney Jaffe, MD .  antiseptic oral rinse solution (CORINZ), 7 mL, Mouth Rinse, QID, Juanito Doom, MD, 7 mL at 09/12/15 1648 .  budesonide (PULMICORT) nebulizer solution 0.5 mg, 0.5 mg, Nebulization, BID, Jose Angelo A de [place], MD, 0.5 mg at 09/12/15 1312 .  chlorhexidine gluconate (SAGE KIT) (PERIDEX) 0.12 % solution 15 mL, 15 mL, Mouth Rinse, BID, Juanito Doom, MD, 15 mL at 09/12/15  1931 .  collagenase (SANTYL) ointment, , Topical, Daily, Todd D McDiarmid, MD .  famotidine (PEPCID) IVPB 20 mg premix, 20 mg, Intravenous, Q24H, Juanito Doom, MD, 20 mg at 09/12/15 0021 .  feeding supplement (NEPRO CARB STEADY) liquid 1,000 mL, 1,000 mL, Per Tube, Q24H, Jose Angelo A Corrie Dandy, MD, Last Rate: 35 mL/hr at 09/12/15 1650, 1,000 mL at 09/12/15 1650 .  feeding supplement (PRO-STAT SUGAR FREE 64)  liquid 30 mL, 30 mL, Per Tube, QID, Jose Angelo A de Larkin Ina, MD, 30 mL at 09/12/15 1800 .  fentaNYL (SUBLIMAZE) injection 100 mcg, 100 mcg, Intravenous, Q2H PRN, Juanito Doom, MD, 100 mcg at 09/12/15 1936 .  heparin injection 1,000 Units, 1,000 Units, Dialysis, PRN, Roney Jaffe, MD .  Derrill Memo ON 09/13/2015] heparin injection 3,500 Units, 3,500 Units, Dialysis, PRN, Roney Jaffe, MD .  heparin injection 4,000 Units, 4,000 Units, Dialysis, PRN, Roney Jaffe, MD .  heparin injection 5,000 Units, 5,000 Units, Subcutaneous, 3 times per day, Maryellen Pile, MD, 5,000 Units at 09/12/15 1440 .  insulin aspart (novoLOG) injection 0-9 Units, 0-9 Units, Subcutaneous, 6 times per day, Maryellen Pile, MD, 1 Units at 09/12/15 1649 .  ipratropium-albuterol (DUONEB) 0.5-2.5 (3) MG/3ML nebulizer solution 3 mL, 3 mL, Nebulization, QID, Jose Angelo A Corrie Dandy, MD, 3 mL at 09/12/15 1538 .  lacosamide (VIMPAT) 200 mg in sodium chloride 0.9 % 25 mL IVPB, 200 mg, Intravenous, Q12H, [place] Fuller Mandril, MD .  lidocaine (PF) (XYLOCAINE) 1 % injection 5 mL, 5 mL, Intradermal, PRN, Roney Jaffe, MD .  lidocaine-prilocaine (EMLA) cream 1 application, 1 application, Topical, PRN, Roney Jaffe, MD .  LORazepam (ATIVAN) injection 2 mg, 2 mg, Intravenous, Q15 min PRN, Juanito Doom, MD .  metoprolol (LOPRESSOR) injection 2.5-5 mg, 2.5-5 mg, Intravenous, Q3H PRN, Juanito Doom, MD, 5 mg at 09/29/2015 2310 .  pentafluoroprop-tetrafluoroeth (GEBAUERS) aerosol 1 application, 1 application, Topical, PRN, Roney Jaffe, MD .  piperacillin-tazobactam (ZOSYN) IVPB 2.25 g, 2.25 g, Intravenous, 3 times per day, Blane Ohara McDiarmid, MD, 2.25 g at 09/12/15 1439 .  propofol (DIPRIVAN) 1000 MG/100ML infusion, 0-50 mcg/kg/min, Intravenous, Continuous, Maryellen Pile, MD, Last Rate: 8.8 mL/hr at 09/12/15 1000, 20 mcg/kg/min at 09/12/15 1000 .  valproate (DEPACON) 250 mg in dextrose 5 % 50 mL IVPB, 250 mg, Intravenous, 4 times  per day, Britiny Defrain Fuller Mandril, MD, 250 mg at 09/12/15 1649  Facility-Administered Medications Ordered in Other Encounters:  .  ferumoxytol (FERAHEME) 1,020 mg in sodium chloride 0.9 % 100 mL IVPB, 1,020 mg, Intravenous, Once, Corliss Parish, MD   Neurologic Examination:                                                                                                     Today's Vitals   09/12/15 1800 09/12/15 1826 09/12/15 1900 09/12/15 2000  BP: 164/82 121/103 180/85 126/50  Pulse: 100 110 105 105  Temp:  98 F (36.7 C)    TempSrc:  Oral    Resp: '14 18 14 28  ' Height:      Weight:  SpO2: 98% 100% 100% 99%  PainSc:         intubated, unresponsive, right pupil 4 mm reactive left eye is prosthetic no motor response to stimulation.   Lab Results: Basic Metabolic Panel:  Recent Labs Lab 09/25/2015 1943 10/01/2015 2009 09/12/15 0438  NA  --  140 144  K  --  4.4 4.9  CL  --  99* 102  CO2  --  22 26  GLUCOSE  --  129* 88  BUN  --  55* 63*  CREATININE  --  7.40* 7.70*  CALCIUM  --  9.0 8.6*  MG  --  2.2  --   PHOS 5.7*  --  6.9*    Liver Function Tests:  Recent Labs Lab 09/26/2015 2009 09/12/15 0438 09/12/15 1029  AST 25  --  17  ALT 12*  --  10*  ALKPHOS 104  --  90  BILITOT 0.6  --  0.9  PROT 7.6  --  7.0  ALBUMIN 3.9 3.7 3.6   No results for input(s): LIPASE, AMYLASE in the last 168 hours.  Recent Labs Lab 10/02/2015 1952  AMMONIA 41*    CBC:  Recent Labs Lab 09/30/2015 2009 09/12/15 0438  WBC 11.9* 19.0*  NEUTROABS 9.1* 16.2*  HGB 12.8* 12.6*  HCT 42.3 41.4  MCV 92.8 92.4  PLT 137* 135*    Cardiac Enzymes:  Recent Labs Lab 10/02/2015 1943  TROPONINI 0.15*    Lipid Panel:  Recent Labs Lab 09/12/15  TRIG 148    CBG:  Recent Labs Lab 09/12/15 09/12/15 0432 09/12/15 0740 09/12/15 1124 09/12/15 1556  GLUCAP 164* 50 110* 159* 134*    Microbiology: Results for orders placed or performed during the hospital encounter of  09/16/2015  Culture, blood (Routine X 2) w Reflex to ID Panel     Status: None (Preliminary result)   Collection Time: 09/16/2015  7:52 PM  Result Value Ref Range Status   Specimen Description BLOOD BLOOD RIGHT FOREARM  Final   Special Requests IN PEDIATRIC BOTTLE 3CC  Final   Culture NO GROWTH < 24 HOURS  Final   Report Status PENDING  Incomplete  Culture, blood (Routine X 2) w Reflex to ID Panel     Status: None (Preliminary result)   Collection Time: 09/17/2015  7:57 PM  Result Value Ref Range Status   Specimen Description BLOOD RIGHT HAND  Final   Special Requests BOTTLES DRAWN AEROBIC ONLY [place]  Final   Culture NO GROWTH < 24 HOURS  Final   Report Status PENDING  Incomplete  MRSA PCR Screening     Status: None   Collection Time: 09/12/15  2:00 AM  Result Value Ref Range Status   MRSA by PCR NEGATIVE NEGATIVE Final    Comment:        The GeneXpert MRSA Assay (FDA approved for NASAL specimens only), is one component of a comprehensive MRSA colonization surveillance program. It is not intended to diagnose MRSA infection nor to guide or monitor treatment for MRSA infections.     Imaging: Dg Chest 1 View  09/13/2015  CLINICAL DATA:  Acute encephalopathy today. Headache and possible seizure today. EXAM: CHEST 1 VIEW COMPARISON:  CT chest 09/10/2015. Single view of the chest 08/27/2015. FINDINGS: Dialysis catheter remains in place. Lung volumes are low with mild basilar atelectasis. Nodule in the right upper lobe is identified as seen on the prior exams. Cardiac silhouette is accentuated by low lung volumes. IMPRESSION: No acute disease.  Right upper lobe pulmonary nodule seen on prior exams. Electronically Signed   By: Inge Rise M.D.   On: 09/30/2015 19:06   Ct Head Wo Contrast  09/16/2015  CLINICAL DATA:  60 year old presenting with acute encephalopathy. EXAM: CT HEAD WITHOUT CONTRAST TECHNIQUE: Contiguous axial images were obtained from the base of the skull through the vertex  without intravenous contrast. COMPARISON:  None. FINDINGS: Motion blurred several of the images but the study appears diagnostic. Ventricular system normal in size and appearance for age. No significant atrophy for age. Low attenuation in the right basal ganglia localizing to the anterior limb of the internal capsule. Mild to moderate changes of small vessel disease of the white matter diffusely. No mass lesion. No midline shift. No acute hemorrhage or hematoma. No extra-axial fluid collections. No skull fracture or other focal osseous abnormality involving the skull. Visualized paranasal sinuses, bilateral mastoid air cells and bilateral middle ear cavities well-aerated. Severe bilateral carotid siphon and vertebral artery atherosclerosis. Left globe prosthesis. IMPRESSION: 1. Age indeterminate nonhemorrhagic lacunar stroke involving the right basal ganglia localizing to the anterior limb of the right internal capsule. 2. No acute intracranial abnormality otherwise. 3. Mild to moderate chronic microvascular ischemic changes of the white matter. 4. Severe bilateral carotid siphon and vertebral artery atherosclerosis. I reviewed these images with Dr. Rolla Flatten, neuroradiologist, who agrees with this interpretation. Electronically Signed   By: Evangeline Dakin M.D.   On: 09/12/2015 19:30   Ct Chest Wo Contrast  09/10/2015  CLINICAL DATA:  Pulmonary nodule EXAM: CT CHEST WITHOUT CONTRAST TECHNIQUE: Multidetector CT imaging of the chest was performed following the standard protocol without IV contrast. COMPARISON:  Chest radiograph August 27, 2015 FINDINGS: Mediastinum/Lymph Nodes: Thyroid appears normal. There are scattered small mediastinal lymph nodes, most of which do not meet size criteria for pathologic significance. There is a lymph node just anterior to the carina measuring 1.4 x 0.9 cm. A second nearby lymph node in this region measures 1.1 x 1.0 cm. There is a central catheter with its tip in the right  atrium. There are multiple foci of coronary artery calcification. Pericardium is not thickened. There is no thoracic aortic aneurysm. There is moderate calcification at the origins of the great vessels. There is moderate calcifications scattered throughout the aorta. Lungs/Pleura: There is a lobulated nodular lesion in the posterior segment of the right upper lobe measuring 2.4 x 2.4 x 2.0 cm. On axial slice 71 series 4, there is a 2 mm nodular opacity abutting the pleura in the inferior lingula. Lungs elsewhere clear. Note that there is slight lower lobe bronchiectatic change bilaterally. Upper abdomen: There is a lesion occupying most of the right lobe of the liver, incompletely visualized, measuring 15.8 x 12.2 cm in its visualized portions. There are areas suggesting stellate scarring within this lesion. Elsewhere in the visualized upper abdomen, there is incomplete visualization of a cyst arising from the upper to mid left kidney measuring 2.1 x 2.0 cm. There is extensive calcification in the splenic artery. Musculoskeletal: There is a right axillofemoral graft, incompletely visualized. There are no blastic or lytic bone lesions. IMPRESSION: Nodular lesion posterior segment right upper lobe measuring 2.4 x 2.4 x 2.0 cm. Mildly prominent lymph nodes anterior to the carina. Other smaller lymph nodes seen on this noncontrast enhanced study. Neoplasm is concern. These findings may warrant PET-CT to further assess. Large lesion occupying much of the right lobe of the liver. Features on this noncontrast enhanced study suggests potential focal nodular  hyperplasia. MR pre and post-contrast would be the imaging study of choice to optimally assess this lesion. A neoplastic focus in the liver, particularly given the changes in the chest, must be of concern. Multiple foci of coronary artery calcification. These results will be called to the ordering clinician or representative by the Radiologist Assistant, and  communication documented in the PACS or zVision Dashboard. Electronically Signed   By: Lowella Grip III M.D.   On: 09/10/2015 15:55   Portable Chest Xray  09/12/2015  CLINICAL DATA:  Hypoxia EXAM: PORTABLE CHEST 1 VIEW COMPARISON:  September 11, 2015 FINDINGS: Endotracheal tube tip is 4.7 cm above the carina. Nasogastric tube tip and side port are below the diaphragm. Dual-lumen central catheter tip is in the right atrium. Left jugular catheter tip is in the left innominate vein near the junction with the superior vena cava. No pneumothorax. There is no edema or consolidation. The previously noted nodular lesion in the right upper lobe is again noted, measuring 2.4 x 2.2 cm. The heart size and pulmonary vascularity are normal. No adenopathy. No demonstrable bone lesions. IMPRESSION: Tube and catheter positions as described without pneumothorax. Stable right upper lobe nodular lesion. No edema or consolidation. Electronically Signed   By: Lowella Grip III M.D.   On: 09/12/2015 07:00   Dg Chest Port 1 View  09/10/2015  CLINICAL DATA:  Respiratory distress. New left internal jugular catheter and orogastric tube placement. EXAM: PORTABLE CHEST 1 VIEW COMPARISON:  Radiograph earlier this day at [place] hour, chest CT 09/10/2015 FINDINGS: Endotracheal tube 4.5 cm from the carina. Enteric tube in place, tip and side port below the diaphragm. Left internal jugular tip in the region of the proximal SVC/ distal brachiocephalic vein. No pneumothorax. Right-sided dialysis catheter tips at the atrial caval junction/right atrium. Mild cardiomegaly and mediastinal contours are unchanged. No pulmonary edema. Right lung nodule again seen. No new airspace disease. No large pleural effusion. IMPRESSION: 1. Endotracheal and enteric tubes in place. 2. Tip of the left internal jugular central line in the region the proximal SVC/distal brachiocephalic vein. No pneumothorax. Electronically Signed   By: Jeb Levering M.D.   On:  09/09/2015 23:42    Assessment and plan:   John [name] is an 59 y.o. male patient with  with status epilepticus on EEG, currently showing GPEDs with background suppression, likely postictal encephalopathy. His sedation with propofol was turned off this morning. After that the background activity has improved with intermittent periods of suppression lasting 3-4 seconds once every 10-15 seconds.  He was given a loading dose of Keppra 2 g last night. Given his end-stage renal failure, on hemodialysis, and discontinued Keppra. Was given a loading dose of Depacon 1 g this morning, followed by maintenance dose of 250 mg every 6 hours. We'll check Depakote level, morning. Towards late evening, EEG continued to continued to show bi-PLEDs, with improved background activity while off sedation with propofol , and intermittent periods of by Suppression.   We'll continue overnight EEG monitoring. Due to continued abnormality seen on the EEG, ordered a loading fosphenytoin 20 mg/kg. Would avoid using fosphenytoin and Depakote together a maintenance therapy due to protein binding competition and drug interaction. Hence Added Vimpat 200 mg twice a day to his regimen.   Tomorrow morning if his EEG is improved with resolution of the PLEDs, we'll consider discontinuing the EEG to obtain a brain MRI study. Patient has known lung and liver mass lesions, unknown etiology.  We'll follow-up

## 2015-09-12 NOTE — Progress Notes (Signed)
vLTM EEG running/ event button pushed. Neuro notified/ no skin breakdown with hookup

## 2015-09-13 ENCOUNTER — Inpatient Hospital Stay (HOSPITAL_COMMUNITY): Payer: Medicare Other

## 2015-09-13 DIAGNOSIS — E440 Moderate protein-calorie malnutrition: Secondary | ICD-10-CM | POA: Insufficient documentation

## 2015-09-13 DIAGNOSIS — G40901 Epilepsy, unspecified, not intractable, with status epilepticus: Principal | ICD-10-CM

## 2015-09-13 LAB — CBC
HCT: 38.9 % — ABNORMAL LOW (ref 39.0–52.0)
HEMOGLOBIN: 11.7 g/dL — AB (ref 13.0–17.0)
MCH: 27.8 pg (ref 26.0–34.0)
MCHC: 30.1 g/dL (ref 30.0–36.0)
MCV: 92.4 fL (ref 78.0–100.0)
PLATELETS: 123 10*3/uL — AB (ref 150–400)
RBC: 4.21 MIL/uL — AB (ref 4.22–5.81)
RDW: 17.5 % — ABNORMAL HIGH (ref 11.5–15.5)
WBC: 15.3 10*3/uL — AB (ref 4.0–10.5)

## 2015-09-13 LAB — RENAL FUNCTION PANEL
ALBUMIN: 3.2 g/dL — AB (ref 3.5–5.0)
ANION GAP: 19 — AB (ref 5–15)
BUN: 57 mg/dL — ABNORMAL HIGH (ref 6–20)
CALCIUM: 8.2 mg/dL — AB (ref 8.9–10.3)
CO2: 24 mmol/L (ref 22–32)
CREATININE: 6.55 mg/dL — AB (ref 0.61–1.24)
Chloride: 99 mmol/L — ABNORMAL LOW (ref 101–111)
GFR, EST AFRICAN AMERICAN: 10 mL/min — AB (ref >60)
GFR, EST NON AFRICAN AMERICAN: 8 mL/min — AB (ref >60)
Glucose, Bld: 112 mg/dL — ABNORMAL HIGH (ref 65–99)
PHOSPHORUS: 7 mg/dL — AB (ref 2.5–4.6)
Potassium: 4.2 mmol/L (ref 3.5–5.1)
SODIUM: 142 mmol/L (ref 135–145)

## 2015-09-13 LAB — GLUCOSE, CAPILLARY
GLUCOSE-CAPILLARY: 278 mg/dL — AB (ref 65–99)
GLUCOSE-CAPILLARY: 139 mg/dL — AB (ref 65–99)
GLUCOSE-CAPILLARY: 173 mg/dL — AB (ref 65–99)
Glucose-Capillary: 117 mg/dL — ABNORMAL HIGH (ref 65–99)
Glucose-Capillary: 111 mg/dL — ABNORMAL HIGH (ref 65–99)
Glucose-Capillary: 195 mg/dL — ABNORMAL HIGH (ref 65–99)

## 2015-09-13 LAB — URINE CULTURE: CULTURE: NO GROWTH

## 2015-09-13 LAB — VALPROIC ACID LEVEL: Valproic Acid Lvl: 46 ug/mL — ABNORMAL LOW (ref 50.0–100.0)

## 2015-09-13 MED ORDER — VALPROATE SODIUM 500 MG/5ML IV SOLN
400.0000 mg | Freq: Four times a day (QID) | INTRAVENOUS | Status: DC
  Administered 2015-09-13 – 2015-09-20 (×25): 400 mg via INTRAVENOUS
  Filled 2015-09-13 (×28): qty 4

## 2015-09-13 MED ORDER — TOPIRAMATE (TOPAMAX) NICU/PEDS ORAL SOLN 20 MG/ML
100.0000 mg | Freq: Two times a day (BID) | ORAL | Status: DC
  Administered 2015-09-14 – 2015-09-17 (×7): 100 mg via ORAL
  Filled 2015-09-13 (×10): qty 16.7

## 2015-09-13 MED ORDER — IOHEXOL 300 MG/ML  SOLN
25.0000 mL | INTRAMUSCULAR | Status: AC
  Administered 2015-09-13 (×2): 25 mL via ORAL

## 2015-09-13 MED ORDER — FAMOTIDINE 20 MG PO TABS
20.0000 mg | ORAL_TABLET | Freq: Every day | ORAL | Status: DC
  Administered 2015-09-13 – 2015-09-17 (×5): 20 mg
  Filled 2015-09-13 (×5): qty 1

## 2015-09-13 MED ORDER — IOPAMIDOL (ISOVUE-300) INJECTION 61%
INTRAVENOUS | Status: AC
  Administered 2015-09-13: 100 mL
  Filled 2015-09-13: qty 100

## 2015-09-13 MED ORDER — SODIUM CHLORIDE 0.9 % IV SOLN
100.0000 mg | INTRAVENOUS | Status: DC | PRN
  Filled 2015-09-13 (×2): qty 10

## 2015-09-13 MED ORDER — SODIUM CHLORIDE 0.9 % IV BOLUS (SEPSIS): 250.0000 mL | Freq: Once | INTRAVENOUS | Status: DC

## 2015-09-13 MED ORDER — IOPAMIDOL (ISOVUE-300) INJECTION 61%
INTRAVENOUS | Status: AC
  Filled 2015-09-13: qty 100

## 2015-09-13 MED ORDER — SODIUM CHLORIDE 0.9 % IV SOLN
1000.0000 mg | INTRAVENOUS | Status: DC
  Administered 2015-09-13 – 2015-09-19 (×7): 1000 mg via INTRAVENOUS
  Filled 2015-09-13 (×7): qty 10

## 2015-09-13 MED ORDER — VANCOMYCIN HCL IN DEXTROSE 750-5 MG/150ML-% IV SOLN
750.0000 mg | Freq: Once | INTRAVENOUS | Status: AC
  Administered 2015-09-13: 750 mg via INTRAVENOUS
  Filled 2015-09-13: qty 150

## 2015-09-13 MED ORDER — TOPIRAMATE (TOPAMAX) NICU/PEDS ORAL SOLN 20 MG/ML
100.0000 mg | ORAL | Status: DC | PRN
  Administered 2015-09-13: 100 mg via ORAL
  Filled 2015-09-13 (×3): qty 16.7

## 2015-09-13 MED ORDER — SODIUM CHLORIDE 0.9 % IV SOLN
500.0000 mg | INTRAVENOUS | Status: DC | PRN
  Administered 2015-09-15: 500 mg via INTRAVENOUS
  Filled 2015-09-13 (×3): qty 5

## 2015-09-13 MED ORDER — LORAZEPAM 2 MG/ML IJ SOLN
INTRAMUSCULAR | Status: AC
  Filled 2015-09-13: qty 1

## 2015-09-13 NOTE — Procedures (Signed)
  Electroencephalogram report- LTM   Data acquisition: 10-20 electrode placement.  Additional T1, T2, and EKG electrodes; 26 channel digital referential acquisition reformatted to 18 channel/7 channel coronal bipolar     Beginning time: 09/12/15 01;32 55 am Ending time: 4/ 06/ 17  07 22 05 am  Day of study: day 1, day 2    This  intensive EEG monitoring with simultaneous video monitoring was performed for this patient with convulsions.  Medications: intubated and sedated with propofol  Day 1: During first hour of the recording the Background activities were marked by  broad 1-2 cps spikes and polyspike/wave discharges with posterior dominance. This pattern changed to more focal and continuous electrographic seizures arising from right occipital cortex. During last portion of the recoding background become more attenuated with superimposed broad periodic sharp waves alternating with runs of more continuous delta and theta slowing . Within these theta runs, there is rhythmic 5-6 cps right post temporal rhythm present.    Day 2: during first few hours of the recording 1-2 cps GPEDs were present, following by achievement of burst  suppression pattern with 6/3 burst suppression  ration. Burst c/w quasi rhythmic theta which preceded consistently by broad sharp wave following  by 1/2 sec attenuation. In addition within that bust independent left fronto central and right fronto central spikes were present; left fronto central spikes tend to occur in train concerning for brief seizures.   Clinical interpretation: This intensive EEG monitoring with simultaneous monitoring c/w resolution of continuous spike/wave discharges and emergence of the  burst suppression pattern as discussed above and frequent seizures within those  Bursts. These findings suggestive of a severe encephalopathy and  multifocal cortical irritability.

## 2015-09-13 NOTE — Progress Notes (Signed)
[facility] Progress Note   Subjective: on vent no change  Filed Vitals:   09/13/15 0500 09/13/15 0600 09/13/15 0738 09/13/15 0742  BP: 147/93 109/58  124/61  Pulse: 111 100  105  Temp:   101 F (38.3 C)   TempSrc:   Oral   Resp: 17 16    Height:      Weight:      SpO2: 99% 99%      Inpatient medications: . antiseptic oral rinse  7 mL Mouth Rinse QID  . budesonide (PULMICORT) nebulizer solution  0.5 mg Nebulization BID  . chlorhexidine gluconate (SAGE KIT)  15 mL Mouth Rinse BID  . collagenase   Topical Daily  . famotidine (PEPCID) IV  20 mg Intravenous Q24H  . feeding supplement (NEPRO CARB STEADY)  1,000 mL Per Tube Q24H  . feeding supplement (PRO-STAT SUGAR FREE 64)  30 mL Per Tube QID  . heparin subcutaneous  5,000 Units Subcutaneous 3 times per day  . insulin aspart  0-15 Units Subcutaneous 6 times per day  . insulin glargine  5 Units Subcutaneous QHS  . ipratropium-albuterol  3 mL Nebulization QID  . lacosamide (VIMPAT) IV  200 mg Intravenous Q12H  . piperacillin-tazobactam (ZOSYN)  IV  2.25 g Intravenous 3 times per day  . valproate sodium  250 mg Intravenous 4 times per day   . propofol (DIPRIVAN) infusion 20 mcg/kg/min (09/12/15 1000)   sodium chloride, sodium chloride, acetaminophen (TYLENOL) oral liquid 160 mg/5 mL, alteplase, fentaNYL (SUBLIMAZE) injection, heparin, heparin, heparin, lidocaine (PF), lidocaine-prilocaine, LORazepam, metoprolol, pentafluoroprop-tetrafluoroeth  Exam: On vent, eyes half-open, not responding to commands, moving arms/ legs Sclera anicteric, throat w ETT in place  No jvd or bruits Chest clear bilat ant/ lat RRR no MRG Abd soft ntnd no mass or ascites +bs GU normal male, foley in place MS L BKA/ R 4+5 toe amps Ext no LE edema / no wounds or ulcers Neuro, on vent R IJ TDC, left UE AVF no bruit  CXR negative CT chest RUL lobulated mass, o/w clear  Dialysis: TTS [facility] 4h 2/2 bath R IJ cath 73kg Hep  7500 Calcitriol 1.25 ug tiw   Assessment: 1. AMS/ seizures - on diprivan and depakote IV 2. Fever/ [place] D[place] - empiric Zosyn 3. Lung mass - RUL by CT, new 4. Liver lesion by CT- large, unclear 5. ESRD missed HD x 2 last week 6. Volume - no vol excess on exam, CXR clear 7. DM on insulin 8. MBD of CKD get records 9. Anemia of CKD - Hb 12, no esa needed, follow  Plan - HD again today in ICU, no fluid off.    Kelly Splinter MD [facility] pager [phone]    cell [phone] 09/13/2015, 8:22 AM    Recent Labs Lab 09/22/2015 1943 09/17/2015 2009 09/12/15 0438 09/13/15 0450  NA  --  140 144 142  K  --  4.4 4.9 4.2  CL  --  99* 102 99*  CO2  --  _0 GLUCOSE  --  129* 88 112*  BUN  --  55* 63* 57*  CREATININE  --  7.40* 7.70* 6.55*  CALCIUM  --  9.0 8.6* 8.2*  PHOS 5.7*  --  6.9* 7.0*    Recent Labs Lab 10/01/2015 2009 09/12/15 0438 09/12/15 1029 09/13/15 0450  AST 25  --  17  --   ALT 12*  --  10*  --   ALKPHOS 104  --  90  --   BILITOT 0.6  --  0.9  --   PROT 7.6  --  7.0  --   ALBUMIN 3.9 3.7 3.6 3.2*    Recent Labs Lab 09/08/2015 2009 09/12/15 0438 09/13/15 0450  WBC 11.9* 19.0* 15.3*  NEUTROABS 9.1* 16.2*  --   HGB 12.8* 12.6* 11.7*  HCT 42.3 41.4 38.9*  MCV 92.8 92.4 92.4  PLT 137* 135* 123*

## 2015-09-13 NOTE — Progress Notes (Signed)
Dialysis treatment completed.  1500 mL ultrafiltrated.  1000 mL net fluid removal.  Patient status unchanged. Lung sounds diminished to ausculation in all fields. No edema. Cardiac: NSR.  Cleansed RIJ catheter with chlorhexidine.  Disconnected lines and flushed ports with saline per protocol.  Ports locked with heparin and capped per protocol.    Report given to bedside, RN Katrice.

## 2015-09-13 NOTE — Progress Notes (Signed)
Arrived to patient room 78M-03 at 0830.  Reviewed treatment plan and this RN agrees with plan.  Report received from bedside RN, Katrice.  Consent verified.  Portable RO chloramines less than 0.02.  Patient Responds to pain, ET tube and ventilator.   Lung sounds diminished to ausculation in all fields. No edema. Cardiac:  ST.  Removed caps and cleansed RIJ catheter with chlorhedxidine.  Aspirated ports of heparin and flushed them with saline per protocol.  Connected and secured lines, initiated treatment at 0841.  UF Goal of 156mL and net fluid removal 1L.  Will continue to monitor.   Dr. Jonnie Finner at bedside.  Verbal order received for net UF of 1L.

## 2015-09-13 NOTE — Progress Notes (Signed)
Dr. Emmit Alexanders was notified of temp 103.2.  Tylenol given

## 2015-09-13 NOTE — Progress Notes (Signed)
PULMONARY / CRITICAL CARE MEDICINE   Name: John [name] MRN: [id number] DOB: November 27, 1955    ADMISSION DATE:  10/03/2015 CONSULTATION DATE:  09/22/2015  REFERRING MD:  Family Med Teaching Service  CHIEF COMPLAINT:  Acute encephalopathy/Seizures  HISTORY OF PRESENT ILLNESS:   John [name] is a 60 year old man with a past medical history significant for ESRD on HD TTS, Insulin Dependent Type 2 DM, PAD s/p left BKA, COPD, CKD Stage III, Stroke/TIAs presented with lethargy and acute encephalopathy. Patient missed Thursday and Saturday dialysis sessions (prior notes say transportation issues, family reports clotting at dialysis). Did receive 2 hours of dialysis today but ended early due to clotting off. Family reports first noticing a change in mental status yesterday with no improvement after dialysis today. Patient was seen in [facility] today and was able to converse with them and answer questions but was very confused. He was sent for direct admission from clinic. He had a brief seizure while in CT scan on arrival with another seizure once he arrived to the floor. Did not receive any medications as the seizures lasted <1 minute. Mental status has deteriorated and patient is now somnolent and only responds to painful stimuli. No history of EOTH use or cirrhosis.   Found to have new RUL lung nodule with a large right lobe liver lesion. CT of his head shows old infarcts. Patient's mental status has not improved following the seizures and PCCM was called to evaluate the patient.  PAST MEDICAL HISTORY :  He  has a past medical history of COPD (chronic obstructive pulmonary disease) ([place]); Chronic back pain; Hyperlipidemia; Foot pain; Peripheral vascular disease ([place]); Asthma; Ulcer (1985); Fracture of foot; Neuromuscular disorder ([place]); Chronic kidney disease; Hypertension; Stroke [facility]); Diabetes mellitus; and Arthritis.  PAST SURGICAL HISTORY: He  has past surgical history that includes Neck surgery; Eye  surgery; Cardiac catheterization; Endarterectomy (12/18/2011); Carotid endarterectomy (Right); Axillary-femoral Bypass Graft (06/18/2012); Femoral-popliteal Bypass Graft (06/18/2012); Amputation (06/18/2012); AV fistula placement (06/24/2012); lower extremity angiogram (Right, 11/21/2011); Enucleation (Left); Below knee leg amputation (Left); Amputation toe (Left, 2016); Transmetatarsal amputation (Left, 2016); Ligation of arteriovenous  fistula (Left, 08/27/2015); Insertion of dialysis catheter (Left, 08/27/2015); and Back surgery.  Allergies  Allergen Reactions  . Nicoderm [Nicotine] Itching and Rash  . Gabapentin Other (See Comments)    Can't walk, shuts legs down  . Vicodin [Hydrocodone-Acetaminophen] Hives and Itching    Current Facility-Administered Medications on File Prior to Encounter  Medication  . ferumoxytol (FERAHEME) 1,020 mg in sodium chloride 0.9 % 100 mL IVPB   Current Outpatient Prescriptions on File Prior to Encounter  Medication Sig  . aspirin 81 MG chewable tablet Chew 81 mg by mouth daily.   Marland Kitchen atorvastatin (LIPITOR) 40 MG tablet Take 40 mg by mouth every evening.  . cephALEXin (KEFLEX) 500 MG capsule Take 1 capsule (500 mg total) by mouth daily.  . cholecalciferol (VITAMIN D) 1000 UNITS tablet Take 1,000 Units by mouth daily.  . collagenase (SANTYL) ointment Apply 1 application topically daily. Please apply to left popliteal fossa  . collagenase (SANTYL) ointment Apply 1 application topically daily.  . fentaNYL (DURAGESIC - DOSED MCG/HR) 12 MCG/HR Place 1 patch (12.5 mcg total) onto the skin every 3 (three) days. Change patch tomorrow  . hydrocerin (EUCERIN) CREA Apply 1 application topically 2 (two) times daily. (Patient taking differently: Apply 1 application topically daily. )  . insulin glargine (LANTUS) 100 UNIT/ML injection Inject 0.05 mLs (5 Units total) into the skin daily. (  Patient taking differently: Inject 5 Units into the skin daily. )  . insulin lispro (HUMALOG)  100 UNIT/ML injection Inject 2-3 Units into the skin 2 (two) times daily as needed for high blood sugar (CBG >180). Per sliding scale: inject 2 units subcutaneously for CBG 180-299, inject 3 units for CBG >300  . lanthanum (FOSRENOL) 1000 MG chewable tablet Chew 1,000 mg by mouth See admin instructions. Take 1 tablet (1000 mg) by mouth with large meals (once every 2-3 days)  . lidocaine-EPINEPHrine-tetracaine (LET) GEL Apply topically to left arm 1 hour before dialysis  . lidocaine-prilocaine (EMLA) cream Apply 1 application topically 3 (three) times a week. Reported on 08/23/2015  . methocarbamol (ROBAXIN) 500 MG tablet Take 1 tablet (500 mg total) by mouth every 6 (six) hours as needed for muscle spasms.  . multivitamin (RENA-VIT) TABS tablet Take 1 tablet by mouth daily.   Marland Kitchen oxyCODONE-acetaminophen (PERCOCET) 10-325 MG tablet Take one by mouth  Every 6 hours prn pain do not fill before September 14 2015  . PROAIR HFA 108 (90 Base) MCG/ACT inhaler INHALE 2 PUFFS BY MOUTH EVERY 4 HOURS AS NEEDED FOR WHEEZING OR SHORTNESS OF BREATH  . [DISCONTINUED] tiotropium (SPIRIVA HANDIHALER) 18 MCG inhalation capsule Place 1 capsule (18 mcg total) into inhaler and inhale daily.    FAMILY HISTORY:  His indicated that his mother is deceased. He indicated that his father is deceased. He indicated that his brother is alive.   SOCIAL HISTORY: He  reports that he has been smoking Cigarettes.  He has a 8 pack-year smoking history. He has never used smokeless tobacco. He reports that he does not drink alcohol or use illicit drugs.  REVIEW OF SYSTEMS:   Not performed due to patient being intubated.  SUBJECTIVE:  Patient is sedated and intubated on a ventilator.  FiO2 has been decreased to 60%.  VITAL SIGNS: BP 153/55 mmHg  Pulse 93  Temp(Src) 99 F (37.2 C) (Oral)  Resp 13  Ht 5\' 8"  (1.727 m)  Wt 164 lb 3.9 oz (74.5 kg)  BMI 24.98 kg/m2  SpO2 100%  HEMODYNAMICS:    VENTILATOR SETTINGS: Vent Mode:  [-]  PRVC FiO2 (%):  [40 %-80 %] 40 % Set Rate:  [14 bmp] 14 bmp Vt Set:  [550 mL] 550 mL PEEP:  [5 cmH20] 5 cmH20 Plateau Pressure:  [15 cmH20-21 cmH20] 20 cmH20  INTAKE / OUTPUT: I/O last 3 completed shifts: In: 2469.2 [I.V.:169.2; Other:290; NG/GT:545; IV [id number] Out: 58 [Urine:317; Emesis/NG output:800]  PHYSICAL EXAMINATION: General:  Ill-appearing man, lying in bed, sedated, intubated, eyes open Neuro:  Getting continuous EEG  HEENT:  Prosthetic LEFT eye, RIGHT pupil PERRL Cardiovascular:  RRR, no m/r/g Lungs:  Ventilator assisted breaths CTAB anteriorly Abdomen:  Soft, non-tender, BS+ Skin:  Left BKA, anterior left knee with unstageable 4x2 cm scabbed wound with no odor/drainage.  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  Present prior to admission.  Left hand with dry gangrenous changes to 3rd and 5th digits.  Left AV fistula with palpable thrill, no erythema.  Right chest wall with bandage over HD catheter site.  This area looks clean.  LABS:  BMET  Recent Labs Lab 10/06/2015 2009 09/12/15 0438 09/13/15 0450  NA 140 144 142  K 4.4 4.9 4.2  CL 99* 102 99*  CO2 22 26 24   BUN 55* 63* 57*  CREATININE 7.40* 7.70* 6.55*  GLUCOSE 129* 88 112*    Electrolytes  Recent Labs Lab 09/15/2015 1943 09/12/2015 2009  09/12/15 0438 09/13/15 0450  CALCIUM  --  9.0 8.6* 8.2*  MG  --  2.2  --   --   PHOS 5.7*  --  6.9* 7.0*    CBC  Recent Labs Lab 09/22/2015 2009 09/12/15 0438 09/13/15 0450  WBC 11.9* 19.0* 15.3*  HGB 12.8* 12.6* 11.7*  HCT 42.3 41.4 38.9*  PLT 137* 135* 123*    Coag's  Recent Labs Lab 09/12/15 1029  INR 1.28    Sepsis Markers  Recent Labs Lab 10/05/2015 1952 09/12/15 1029  LATICACIDVEN 6.0* 1.1    ABG  Recent Labs Lab 09/12/15 0038 09/12/15 0453  PHART 7.285* 7.375  PCO2ART 50.7* 42.7  PO2ART 132* 378*    Liver Enzymes  Recent Labs Lab 10/07/2015 2009 09/12/15 0438 09/12/15 1029 09/13/15 0450  AST 25  --  17  --    ALT 12*  --  10*  --   ALKPHOS 104  --  90  --   BILITOT 0.6  --  0.9  --   ALBUMIN 3.9 3.7 3.6 3.2*    Cardiac Enzymes  Recent Labs Lab 09/19/2015 1943  TROPONINI 0.15*    Glucose  Recent Labs Lab 09/12/15 1556 09/12/15 2029 09/12/15 2323 09/13/15 0316 09/13/15 0735 09/13/15 1112  GLUCAP 134* 246* 278* 139* 117* 111*    Imaging Dg Chest Port 1 View  09/13/2015  CLINICAL DATA:  Respiratory failure. EXAM: PORTABLE CHEST 1 VIEW COMPARISON:  09/12/2015. FINDINGS: Endotracheal tube, NG tube, dual-lumen right IJ, left IJ catheter in stable position. Heart size normal. Low lung volumes with mild basilar atelectasis. No pleural effusion or pneumothorax. IMPRESSION: 1. Lines and tubes in stable position. 2. Low lung volumes with mild bibasilar atelectasis and/or infiltrate. 3. Persistent right upper lobe nodular lesion, unchanged. Electronically Signed   By: Marcello Moores  Register   On: 09/13/2015 07:21     STUDIES:  04/04 > CXR with new RUL pulmonary nodule  04/04 > 2.4 x 2.3 x 2.0 cm RUL nodule. 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  Also with new onset seizures of unclear etiology.  PULMONARY A: Acute hypoxic hypercarbic respiratory failure likely secondary to AMS/seizure COPD P:   - continue mechanical ventilation - attempt to decrease FiO2 to 40-50% to maintain  oxygen saturation 91% - daily CXR while intubated - nebulizers for COPD - Pulmicort BID, Duonebs QID  CARDIOVASCULAR A:  History of PAD SVT on admission with HR 180 P:  - Metoprolol 2.5-5mg  IV Q3H prn - may need vascular surgery consult regarding left hand  RENAL A:   ESRD on HD TThSat P:   - consulted nephrology, HD done yesterday and today - renal function panel daily  GASTROINTESTINAL A:   Intubated in ICU Moderate, Non-severe Protein Calorie Malnutrition P:   - Famotidine 20mg  IV Q24H - Tube feeds, nutrition consult  HEMATOLOGIC A:   Liver lesion with new RUL lung nodule suspicious for metastatic neoplasm Thrombocytopenia P:  - MRI brain w/o contrast pending - CT abdomen with contrast to look for other possible GI pathology.  May also need liver MRI - DVT PPx > Heparin - Will likely need percutaneous biopsy of lung nodule hopefully once extubated - Monitor platelets  INFECTIOUS A:   Leukoctyosis - improving Fevers - Tmax last 24 hours 101 P:   - follow up blood cx, currently no growth - urine culture okay - tracheal aspirate culture with no organisms on Gram stain - continue vanc and zosyn - follow WBC - WOC for chronic pressure ulcer  ENDOCRINE A:   Insulin dependent DM2   P:   - CBG Q4H - Lantus - SSI  NEUROLOGIC A:   New onset seizures, unclear etiology Status epilepticus P:  - Neurology consult appreciated - AED's per neurology - Propofol gtt being held - EEG ongoing - MRI brain pending - RASS goal: -1    FAMILY  - Updates: family updated this afternoon, wife and son in the room.  [name] [id number] or [name] [phone]   Pulmonary and [place] Pager: [phone]  09/13/2015, 1:10 PM

## 2015-09-13 NOTE — Progress Notes (Signed)
Dr. Emmit Alexanders notified of pt HR consistently 120 - an increase from this morning.  He directed me to notifiy Renal.  Dr. Moshe Cipro was notified and gave orders.

## 2015-09-13 NOTE — Progress Notes (Signed)
LTM EEG discontinued.  No skin breakdown noted. Pt going to CT and MRI.

## 2015-09-13 NOTE — Progress Notes (Signed)
Pharmacy Antibiotic Note  John [name] is a 60 y.o. male admitted on 09/18/2015 with concern for popliteal wound infection or HD access infection.  Pharmacy has been consulted for vancomycin and Zosyn dosing. Patient missed HD x2 sessions pta and only received 2hrs of session on 4/4. Vancomycin loading dose given 4/4. Vancomycin 750 mg IV x1 given 4/5. Now plan for HD today and continue with T,Th,Sat sessions.  Tolerated HD 4/5 and 4/6 - BFR 400 for 3.5 hrs and 4 hrs respectively- note not pulling fluid.   Plan: Continue Zosyn 2.25g IV every 8 hours.  Vancomycin 750 mg IV x1 AFTER dialysis today.  Get pre-HD vanc level prior to next HD session. Follow-up toleration of HD today before further doses scheduled.  Height: 5\' 8"  (172.7 cm) Weight: 164 lb 3.9 oz (74.5 kg) IBW/kg (Calculated) : 68.4  Temp (24hrs), Avg:99.6 F (37.6 C), Min:98 F (36.7 C), Max:101 F (38.3 C)   Recent Labs Lab 09/09/2015 1952 10/06/2015 2009 09/12/15 0438 09/12/15 1029 09/13/15 0450  WBC  --  11.9* 19.0*  --  15.3*  CREATININE  --  7.40* 7.70*  --  6.55*  LATICACIDVEN 6.0*  --   --  1.1  --     Estimated Creatinine Clearance: 11.6 mL/min (by C-G formula based on Cr of 6.55).    Allergies  Allergen Reactions  . Nicoderm [Nicotine] Itching and Rash  . Gabapentin Other (See Comments)    Can't walk, shuts legs down  . Vicodin [Hydrocodone-Acetaminophen] Hives and Itching    Antimicrobials this admission: 4/4 Zosyn >> 4/4 vancomycin >>  Dose adjustments this admission: n/a  Microbiology results: 4/4 MRSA PCR negative 4/4 Blood >> 4/4 Urine: negative  Thank you for allowing pharmacy to be a part of this patient's care.  Sloan Leiter, PharmD, BCPS Clinical Pharmacist [phone]  09/13/2015 1:19 PM

## 2015-09-13 NOTE — Progress Notes (Signed)
Patient transported to CT and then MRI on vent without complications.

## 2015-09-14 ENCOUNTER — Inpatient Hospital Stay (HOSPITAL_COMMUNITY): Payer: Medicare Other

## 2015-09-14 LAB — RENAL FUNCTION PANEL
Albumin: 2.9 g/dL — ABNORMAL LOW (ref 3.5–5.0)
Anion gap: 16 — ABNORMAL HIGH (ref 5–15)
BUN: 42 mg/dL — AB (ref 6–20)
CHLORIDE: 93 mmol/L — AB (ref 101–111)
CO2: 24 mmol/L (ref 22–32)
Calcium: 7.7 mg/dL — ABNORMAL LOW (ref 8.9–10.3)
Creatinine, Ser: 5.31 mg/dL — ABNORMAL HIGH (ref 0.61–1.24)
GFR calc Af Amer: 12 mL/min — ABNORMAL LOW (ref >60)
GFR, EST NON AFRICAN AMERICAN: 11 mL/min — AB (ref >60)
Glucose, Bld: 70 mg/dL (ref 65–99)
POTASSIUM: 3.7 mmol/L (ref 3.5–5.1)
Phosphorus: 5.2 mg/dL — ABNORMAL HIGH (ref 2.5–4.6)
Sodium: 133 mmol/L — ABNORMAL LOW (ref 135–145)

## 2015-09-14 LAB — GLUCOSE, CAPILLARY
GLUCOSE-CAPILLARY: 107 mg/dL — AB (ref 65–99)
GLUCOSE-CAPILLARY: 111 mg/dL — AB (ref 65–99)
GLUCOSE-CAPILLARY: 122 mg/dL — AB (ref 65–99)
GLUCOSE-CAPILLARY: 131 mg/dL — AB (ref 65–99)
GLUCOSE-CAPILLARY: 62 mg/dL — AB (ref 65–99)
Glucose-Capillary: 82 mg/dL (ref 65–99)
Glucose-Capillary: 79 mg/dL (ref 65–99)
Glucose-Capillary: 87 mg/dL (ref 65–99)

## 2015-09-14 LAB — CBC
HEMATOCRIT: 34.5 % — AB (ref 39.0–52.0)
Hemoglobin: 10.8 g/dL — ABNORMAL LOW (ref 13.0–17.0)
MCH: 29.3 pg (ref 26.0–34.0)
MCHC: 31.3 g/dL (ref 30.0–36.0)
MCV: 93.5 fL (ref 78.0–100.0)
PLATELETS: 98 10*3/uL — AB (ref 150–400)
RBC: 3.69 MIL/uL — ABNORMAL LOW (ref 4.22–5.81)
RDW: 17.2 % — AB (ref 11.5–15.5)
WBC: 14.7 10*3/uL — ABNORMAL HIGH (ref 4.0–10.5)

## 2015-09-14 LAB — C DIFFICILE QUICK SCREEN W PCR REFLEX
C DIFFICLE (CDIFF) ANTIGEN: POSITIVE — AB
C Diff toxin: NEGATIVE

## 2015-09-14 MED ORDER — DEXTROSE 50 % IV SOLN
INTRAVENOUS | Status: AC
  Administered 2015-09-14: 50 mL
  Filled 2015-09-14: qty 50

## 2015-09-14 NOTE — Progress Notes (Signed)
MEDICATION RELATED NOTE   Pharmacy Re:  Home Meds  Assessment: Pharmacy attempted to obtain an accurate med-list with patient and/or family on several occasions.  There are patient reported, as well as prescribed medications on his PTA list.  He also has a red-box with a prescription filled in March that is not currently on his med - list (Alprazolam).  We were unable to obtain the last dose information and whether the patient was currently taking all of the medications listed.  Plan:  - I am marking his med-list as complete.  Please continue those you fill appropriate throughout his transitions of care.   Medications:  Prescriptions prior to admission  Medication Sig Dispense Refill Last Dose  . aspirin 81 MG chewable tablet Chew 81 mg by mouth daily.    Taking  . atorvastatin (LIPITOR) 40 MG tablet Take 40 mg by mouth every evening.   Taking  . cephALEXin (KEFLEX) 500 MG capsule Take 1 capsule (500 mg total) by mouth daily. 14 capsule 0   . cholecalciferol (VITAMIN D) 1000 UNITS tablet Take 1,000 Units by mouth daily.   Taking  . collagenase (SANTYL) ointment Apply 1 application topically daily. Please apply to left popliteal fossa 15 g 0 Taking  . collagenase (SANTYL) ointment Apply 1 application topically daily. 90 g 3 Taking  . fentaNYL (DURAGESIC - DOSED MCG/HR) 12 MCG/HR Place 1 patch (12.5 mcg total) onto the skin every 3 (three) days. Change patch tomorrow 5 patch 0 Taking  . hydrocerin (EUCERIN) CREA Apply 1 application topically 2 (two) times daily. (Patient taking differently: Apply 1 application topically daily. )  0 Taking  . insulin glargine (LANTUS) 100 UNIT/ML injection Inject 0.05 mLs (5 Units total) into the skin daily. (Patient taking differently: Inject 5 Units into the skin daily. ) 30 mL 0 Taking  . insulin lispro (HUMALOG) 100 UNIT/ML injection Inject 2-3 Units into the skin 2 (two) times daily as needed for high blood sugar (CBG >180). Per sliding scale: inject 2 units  subcutaneously for CBG 180-299, inject 3 units for CBG >300   Taking  . lanthanum (FOSRENOL) 1000 MG chewable tablet Chew 1,000 mg by mouth See admin instructions. Take 1 tablet (1000 mg) by mouth with large meals (once every 2-3 days)   Taking  . lidocaine-EPINEPHrine-tetracaine (LET) GEL Apply topically to left arm 1 hour before dialysis 1 Syringe 2 Taking  . lidocaine-prilocaine (EMLA) cream Apply 1 application topically 3 (three) times a week. Reported on 08/23/2015   Taking  . methocarbamol (ROBAXIN) 500 MG tablet Take 1 tablet (500 mg total) by mouth every 6 (six) hours as needed for muscle spasms. 90 tablet 0 Taking  . multivitamin (RENA-VIT) TABS tablet Take 1 tablet by mouth daily.    Taking  . oxyCODONE-acetaminophen (PERCOCET) 10-325 MG tablet Take one by mouth  Every 6 hours prn pain do not fill before September 14 2015 120 tablet 0 Taking  . PROAIR HFA 108 (90 Base) MCG/ACT inhaler INHALE 2 PUFFS BY MOUTH EVERY 4 HOURS AS NEEDED FOR WHEEZING OR SHORTNESS OF BREATH 8.5 g 0 Taking   Rober Minion, PharmD., MS Clinical Pharmacist Pager:  [phone] Thank you for allowing pharmacy to be part of this patients care team. 09/14/2015,4:27 PM

## 2015-09-14 NOTE — Progress Notes (Signed)
Results for ANGELINA, HANKO (MRN [id number]) as of 09/14/2015 08:57  Ref. Range 09/14/2015 00:55 09/14/2015 03:19 09/14/2015 04:02 09/14/2015 04:23 09/14/2015 07:35  Glucose-Capillary Latest Ref Range: 65-99 mg/dL 131 (H) 62 (L) 82 107 (H) 79  CBGs are running less than 100 mg/dl. Recommend discontinuing Lantus 5 units daily if CBGs continue to be less than 100 mg/dl. Will continue to monitor blood sugars while in hospital. Harvel Ricks RN BSN CDE

## 2015-09-14 NOTE — Progress Notes (Signed)
Result of MRI was called to  On call neurologist A. ShikmanMD.

## 2015-09-14 NOTE — Progress Notes (Signed)
Family Medicine / Primary Care Provider  [name] MD I am the [facility] care provider for John [name]. I appreciate the communication I have had with the ICU team. I have been keeping up with his status via EPIC as well. I spoke with his wife, John [name] at bedside today. We discussed his prognosis which is extremely poor. John [name] is trying to make difficult decisions at this time. [name]'s scans and EEG reveal a picture to me of likely renal cell carcinoma with probable metastatic disease to right lung. The imaging abnormality regarding his liver is most likely  hemangioma (previously seen in 2008-09). His EEG and MRI findings are consistent with bilateral hippocampal diffusion abnormalities consistent with an encephalopathy. This could be from renal disease (uremia), infection, dis-regulation, status epilepticus etc.. Given his absoulte failure of breathing effort on his own today at his breathing trials  and his lack of improvement with both broad spectrum antibiotics and repeated dialysis, I think his prognosis is very poor and most likely terminal. I spoke with John [name] and his sister Ulis Rias at bedside. There is no rush for them to make all of the decisions. His code status is currently full and I told John [name] if he did code, it was very unlikely that he would survive. As the family struggles with these issues I will be available over the weekend by my cell phone. I appreciate CCM, neurology, ID care and CCM's efforts to keep me in the loop. My cell is [phone].

## 2015-09-14 NOTE — Progress Notes (Signed)
Called by Nursing about possible seizure activity, hypoglycemia. Responded to bedside to patient after he was given 1/2 amp D50 with improvement of hypoglycemia. Patient found to be unresponsive to verbal stimuli but with withdrawal from painful stimuli.  He does have intermittent slight pronation of his left upper extremity intermittently.  I ordered 2mg  of Ativan to break this possible seizure activity without response.  He is currently on 3 antiepileptic agents.I called an spoke with the on call neurologist Dr Cristobal Goldmann who was not surprised by this given his previous EEG and MRI.  He will arrage for the EEG to be replaced and instructed to hold off on further ativan unless he become more tachycardic, hypertensive, or more generalized seizing.   Lucious Groves, DO IMTS PGY-3

## 2015-09-14 NOTE — Progress Notes (Signed)
[facility] Progress Note   Subjective: on vent, off propofol.  On 4 AED 's, IV x 3 and one per tube  Filed Vitals:   09/14/15 0800 09/14/15 0900 09/14/15 1146 09/14/15 1158  BP: 94/80 111/58    Pulse: 101 100 93   Temp:    99.6 F (37.6 C)  TempSrc:    Oral  Resp: _0 Height:      Weight:      SpO2: 99% 99% 100%     Inpatient medications: . antiseptic oral rinse  7 mL Mouth Rinse QID  . budesonide (PULMICORT) nebulizer solution  0.5 mg Nebulization BID  . chlorhexidine gluconate (SAGE KIT)  15 mL Mouth Rinse BID  . collagenase   Topical Daily  . famotidine  20 mg Per Tube Daily  . feeding supplement (NEPRO CARB STEADY)  1,000 mL Per Tube Q24H  . feeding supplement (PRO-STAT SUGAR FREE 64)  30 mL Per Tube QID  . heparin subcutaneous  5,000 Units Subcutaneous 3 times per day  . insulin aspart  0-15 Units Subcutaneous 6 times per day  . ipratropium-albuterol  3 mL Nebulization QID  . lacosamide (VIMPAT) IV  200 mg Intravenous Q12H  . levETIRAcetam  1,000 mg Intravenous Q24H  . sodium chloride  250 mL Intravenous Once  . topiramate  100 mg Oral Q12H  . valproate sodium  400 mg Intravenous 4 times per day   . propofol (DIPRIVAN) infusion Stopped (09/14/15 0900)   sodium chloride, sodium chloride, acetaminophen (TYLENOL) oral liquid 160 mg/5 mL, alteplase, fentaNYL (SUBLIMAZE) injection, heparin, heparin, heparin, lacosamide (VIMPAT) IV, levETIRAcetam, lidocaine (PF), lidocaine-prilocaine, LORazepam, metoprolol, pentafluoroprop-tetrafluoroeth, topiramate  Exam: On vent, eyes half-open, not responding to commands, moving arms/ legs intermittently Sclera anicteric, throat w ETT in place  No jvd or bruits Chest clear bilat ant/ lat RRR no MRG Abd soft ntnd no mass or ascites +bs GU normal male, foley in place MS L BKA/ R 4+5 toe amps Ext no LE edema / no wounds or ulcers Neuro, on vent R IJ TDC, left UE AVF no bruit  CXR negative CT chest RUL  lobulated mass, o/w clear  Dialysis: TTS [facility] 4h 2/2 bath R IJ cath 73kg Hep 7500 Calcitriol 1.25 ug tiw   Assessment: 1. AMS/ seizures - off propofol, on vimpat/ Keppra/ depakote IV and po topiramate 2. Fever/ [place] - empiric Zosyn 3. Lung mass/ renal mass - by CT, both have possible malignant cause 4. Liver lesion by CT- large hemangioma 5. ESRD missed HD x 2 last week. HD here Wed and Thursday. Plan HD tomorrow.  6. Volume - no vol excess on exam, CXR clear, under dry wt and BP's soft 7. DM on insulin 8. MBD of CKD get records 9. Anemia of CKD - Hb 11, no esa needed, follow  Plan - HD Saturday, NS bolus x 1 for low bp now.     Kelly Splinter MD [facility] pager [phone]    cell [phone] 09/14/2015, 1:05 PM    Recent Labs Lab 09/12/15 0438 09/13/15 0450 09/14/15 0532  NA 144 142 133*  K 4.9 4.2 3.7  CL 102 99* 93*  CO2 _1 GLUCOSE 88 112* 70  BUN 63* 57* 42*  CREATININE 7.70* 6.55* 5.31*  CALCIUM 8.6* 8.2* 7.7*  PHOS 6.9* 7.0* 5.2*    Recent Labs Lab 09/10/2015 2009  09/12/15 1029 09/13/15 0450 09/14/15 0532  AST 25  --  17  --   --  ALT 12*  --  10*  --   --   ALKPHOS 104  --  90  --   --   BILITOT 0.6  --  0.9  --   --   PROT 7.6  --  7.0  --   --   ALBUMIN 3.9  < > 3.6 3.2* 2.9*  < > = values in this interval not displayed.  Recent Labs Lab 09/10/2015 2009 09/12/15 0438 09/13/15 0450 09/14/15 0500  WBC 11.9* 19.0* 15.3* 14.7*  NEUTROABS 9.1* 16.2*  --   --   HGB 12.8* 12.6* 11.7* 10.8*  HCT 42.3 41.4 38.9* 34.5*  MCV 92.8 92.4 92.4 93.5  PLT 137* 135* 123* 98*

## 2015-09-14 NOTE — Progress Notes (Signed)
LTM day 1 hooked up, no skin breakdown seen

## 2015-09-14 NOTE — Progress Notes (Signed)
PULMONARY / CRITICAL CARE MEDICINE   Name: John [name] MRN: [id number] DOB: April 05, 1956    ADMISSION DATE:  09/29/2015 CONSULTATION DATE:  09/14/2015  REFERRING MD:  [facility] Teaching Service  CHIEF COMPLAINT:  Acute encephalopathy/Seizures  HISTORY OF PRESENT ILLNESS:   John [name] is a 60 year old man with a past medical history significant for ESRD on HD TTS, Insulin Dependent Type 2 DM, PAD s/p left BKA, COPD, CKD Stage III, Stroke/TIAs presented with lethargy and acute encephalopathy. Patient missed Thursday and Saturday dialysis sessions (prior notes say transportation issues, family reports clotting at dialysis). Did receive 2 hours of dialysis today but ended early due to clotting off. Family reports first noticing a change in mental status yesterday with no improvement after dialysis today. Patient was seen in [facility] today and was able to converse with them and answer questions but was very confused. He was sent for direct admission from clinic. He had a brief seizure while in CT scan on arrival with another seizure once he arrived to the floor. Did not receive any medications as the seizures lasted <1 minute. Mental status has deteriorated and patient is now somnolent and only responds to painful stimuli. No history of EOTH use or cirrhosis.   Found to have new RUL lung nodule with a large right lobe liver lesion. CT of his head shows old infarcts. Patient's mental status has not improved following the seizures and PCCM was called to evaluate the patient.  PAST MEDICAL HISTORY :  He  has a past medical history of COPD (chronic obstructive pulmonary disease) ([place]); Chronic back pain; Hyperlipidemia; Foot pain; Peripheral vascular disease ([place]); Asthma; Ulcer (1985); Fracture of foot; Neuromuscular disorder ([place]); Chronic kidney disease; Hypertension; Stroke [facility]); Diabetes mellitus; and Arthritis.  PAST SURGICAL HISTORY: He  has past surgical history that includes Neck surgery; Eye  surgery; Cardiac catheterization; Endarterectomy (12/18/2011); Carotid endarterectomy (Right); Axillary-femoral Bypass Graft (06/18/2012); Femoral-popliteal Bypass Graft (06/18/2012); Amputation (06/18/2012); AV fistula placement (06/24/2012); lower extremity angiogram (Right, 11/21/2011); Enucleation (Left); Below knee leg amputation (Left); Amputation toe (Left, 2016); Transmetatarsal amputation (Left, 2016); Ligation of arteriovenous  fistula (Left, 08/27/2015); Insertion of dialysis catheter (Left, 08/27/2015); and Back surgery.  Allergies  Allergen Reactions  . Nicoderm [Nicotine] Itching and Rash  . Gabapentin Other (See Comments)    Can't walk, shuts legs down  . Vicodin [Hydrocodone-Acetaminophen] Hives and Itching    Current Facility-Administered Medications on File Prior to Encounter  Medication  . ferumoxytol (FERAHEME) 1,020 mg in sodium chloride 0.9 % 100 mL IVPB   Current Outpatient Prescriptions on File Prior to Encounter  Medication Sig  . aspirin 81 MG chewable tablet Chew 81 mg by mouth daily.   Marland Kitchen atorvastatin (LIPITOR) 40 MG tablet Take 40 mg by mouth every evening.  . cephALEXin (KEFLEX) 500 MG capsule Take 1 capsule (500 mg total) by mouth daily.  . cholecalciferol (VITAMIN D) 1000 UNITS tablet Take 1,000 Units by mouth daily.  . collagenase (SANTYL) ointment Apply 1 application topically daily. Please apply to left popliteal fossa  . collagenase (SANTYL) ointment Apply 1 application topically daily.  . fentaNYL (DURAGESIC - DOSED MCG/HR) 12 MCG/HR Place 1 patch (12.5 mcg total) onto the skin every 3 (three) days. Change patch tomorrow  . hydrocerin (EUCERIN) CREA Apply 1 application topically 2 (two) times daily. (Patient taking differently: Apply 1 application topically daily. )  . insulin glargine (LANTUS) 100 UNIT/ML injection Inject 0.05 mLs (5 Units total) into the skin daily. (  Patient taking differently: Inject 5 Units into the skin daily. )  . insulin lispro (HUMALOG)  100 UNIT/ML injection Inject 2-3 Units into the skin 2 (two) times daily as needed for high blood sugar (CBG >180). Per sliding scale: inject 2 units subcutaneously for CBG 180-299, inject 3 units for CBG >300  . lanthanum (FOSRENOL) 1000 MG chewable tablet Chew 1,000 mg by mouth See admin instructions. Take 1 tablet (1000 mg) by mouth with large meals (once every 2-3 days)  . lidocaine-EPINEPHrine-tetracaine (LET) GEL Apply topically to left arm 1 hour before dialysis  . lidocaine-prilocaine (EMLA) cream Apply 1 application topically 3 (three) times a week. Reported on 08/23/2015  . methocarbamol (ROBAXIN) 500 MG tablet Take 1 tablet (500 mg total) by mouth every 6 (six) hours as needed for muscle spasms.  . multivitamin (RENA-VIT) TABS tablet Take 1 tablet by mouth daily.   Marland Kitchen oxyCODONE-acetaminophen (PERCOCET) 10-325 MG tablet Take one by mouth  Every 6 hours prn pain do not fill before September 14 2015  . PROAIR HFA 108 (90 Base) MCG/ACT inhaler INHALE 2 PUFFS BY MOUTH EVERY 4 HOURS AS NEEDED FOR WHEEZING OR SHORTNESS OF BREATH  . [DISCONTINUED] tiotropium (SPIRIVA HANDIHALER) 18 MCG inhalation capsule Place 1 capsule (18 mcg total) into inhaler and inhale daily.    FAMILY HISTORY:  His indicated that his mother is deceased. He indicated that his father is deceased. He indicated that his brother is alive.   SOCIAL HISTORY: He  reports that he has been smoking Cigarettes.  He has a 8 pack-year smoking history. He has never used smokeless tobacco. He reports that he does not drink alcohol or use illicit drugs.  REVIEW OF SYSTEMS:   Not performed due to patient being intubated.  SUBJECTIVE:  Patient is sedated and intubated on a ventilator.  His FiO2 requirement is 40%.  Had episode of hypoglycemia overnight that responded to amp of D50.  Had some heart rates into the 120s, loose BM's, and possibly more continued seizure activity.  VITAL SIGNS: BP 111/58 mmHg  Pulse 93  Temp(Src) 100.1 F (37.8  C) (Oral)  Resp 21  Ht 5\' 8"  (1.727 m)  Wt 156 lb 15.5 oz (71.2 kg)  BMI 23.87 kg/m2  SpO2 100%  HEMODYNAMICS:    VENTILATOR SETTINGS: Vent Mode:  [-] CPAP;PSV FiO2 (%):  [40 %] 40 % Set Rate:  [14 bmp] 14 bmp Vt Set:  [550 mL] 550 mL PEEP:  [5 cmH20] 5 cmH20 Pressure Support:  [10 cmH20] 10 cmH20 Plateau Pressure:  [17 cmH20-20 cmH20] 19 cmH20  INTAKE / OUTPUT: I/O last 3 completed shifts: In: 3866.5 [I.V.:350; Other:170; NG/GT:2607.5; IV Piggyback:739] Out: 1287 [Urine:137; Other:1000; Stool:150]  PHYSICAL EXAMINATION: General:  Ill-appearing man, lying in bed, sedated, intubated, eyes open Neuro:  Left sided posturing, not responding to verbal stimulus, withdraws from pain HEENT:  Tongue protruded, deviating to right Cardiovascular:  RRR, no m/r/g Lungs:  Ventilator assisted breaths CTAB anteriorly Abdomen:  Soft, non-tender, BS+ GI/GU: rectal tube in place with dark colored stool in bag, Foley cath in place Skin:  Left BKA, anterior left knee with unstageable 4x2 cm scabbed wound with no odor/drainage.  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  Present prior to admission.  Right chest wall with bandage over HD catheter site.  This area does not look infected.  LABS:  BMET  Recent Labs Lab 09/12/15 0438 09/13/15 0450 09/14/15 0532  NA 144 142 133*  K 4.9 4.2 3.7  CL  102 99* 93*  CO2 26 24 24   BUN 63* 57* 42*  CREATININE 7.70* 6.55* 5.31*  GLUCOSE 88 112* 70    Electrolytes  Recent Labs Lab 09/08/2015 2009 09/12/15 0438 09/13/15 0450 09/14/15 0532  CALCIUM 9.0 8.6* 8.2* 7.7*  MG 2.2  --   --   --   PHOS  --  6.9* 7.0* 5.2*    CBC  Recent Labs Lab 09/12/15 0438 09/13/15 0450 09/14/15 0500  WBC 19.0* 15.3* 14.7*  HGB 12.6* 11.7* 10.8*  HCT 41.4 38.9* 34.5*  PLT 135* 123* 98*    Coag's  Recent Labs Lab 09/12/15 1029  INR 1.28    Sepsis Markers  Recent Labs Lab 10/01/2015 1952 09/12/15 1029  LATICACIDVEN 6.0* 1.1     ABG  Recent Labs Lab 09/12/15 0038 09/12/15 0453  PHART 7.285* 7.375  PCO2ART 50.7* 42.7  PO2ART 132* 378*    Liver Enzymes  Recent Labs Lab 09/10/2015 2009  09/12/15 1029 09/13/15 0450 09/14/15 0532  AST 25  --  17  --   --   ALT 12*  --  10*  --   --   ALKPHOS 104  --  90  --   --   BILITOT 0.6  --  0.9  --   --   ALBUMIN 3.9  < > 3.6 3.2* 2.9*  < > = values in this interval not displayed.  Cardiac Enzymes  Recent Labs Lab 09/23/2015 1943  TROPONINI 0.15*    Glucose  Recent Labs Lab 09/13/15 1914 09/14/15 0055 09/14/15 0319 09/14/15 0402 09/14/15 0423 09/14/15 0735  GLUCAP 173* 131* 101* 60 107* 17    Imaging Mr Brain Wo Contrast  09/14/2015  ADDENDUM REPORT: 09/14/2015 00:44 ADDENDUM: Subacute RIGHT posterior temporal lobe subcentimeter infarct. Electronically Signed   By: Elon Alas M.D.   On: 09/14/2015 00:44  09/14/2015  CLINICAL DATA:  Status epilepticus, postictal encephalopathy. History of posterior fossa stroke and LEFT vertebral artery occlusion. History of seizures, end-stage renal disease on dialysis, hypertension, hyperlipidemia, diabetes, stroke. EXAM: MRI HEAD WITHOUT CONTRAST TECHNIQUE: Multiplanar, multiecho pulse sequences of the brain and surrounding structures were obtained without intravenous contrast. COMPARISON:  CT head September 11, 2015 FINDINGS: The ventricles and sulci are normal for patient's age. No mass lesions, mass effect. 1 cm reduced diffusion RIGHT posterior temporal lobe, normalized ADC values and bright FLAIR T2 hyperintense signal. Symmetric T2 bright FLAIR signal within the hippocampi bilaterally, corresponding reduced diffusion. No susceptibility artifact to suggest hemorrhage. Patchy T2 bright FLAIR signal within the bilateral cerebellum, in the periphery of the temporal lobes, periphery of the frontal parietal lobes. Old LEFT thalamus lacunar infarct. Old LEFT basal ganglia lacunar infarcts. Old LEFT anterior limb of the  internal capsule infarct. Old tiny bilateral cerebellar infarcts. No abnormal extra-axial fluid collections. No extra-axial masses though, contrast enhanced sequences would be more sensitive. Normal major intracranial vascular flow voids seen at the skull base. Status post LEFT ocular globe prosthesis. No abnormal sellar expansion. No suspicious calvarial bone marrow signal. Craniocervical junction maintained. Status post upper cervical ACDF, incompletely evaluated. Trace paranasal sinus mucosal thickening and mastoid effusions, life-support lines in place. IMPRESSION: Symmetric abnormal hippocampi signal compatible status epilepticus, and postictal state. 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 Old LEFT thalamus and LEFT basal ganglia lacunar infarcts. Old RIGHT internal capsule infarct. Old tiny cerebellar infarcts. These results will be called to the ordering  clinician or representative by the Radiologist Assistant, and communication documented in the PACS or zVision Dashboard. Electronically Signed: By: Elon Alas M.D. On: 09/13/2015 23:54   Ct Abdomen Pelvis W Contrast  09/13/2015  CLINICAL DATA:  Liver lesions seen by chest CT. Previous documentation of large hepatic hemangioma. EXAM: CT ABDOMEN AND PELVIS WITH CONTRAST TECHNIQUE: Multidetector CT imaging of the abdomen and pelvis was performed using the standard protocol following bolus administration of intravenous contrast. CONTRAST:  100 mL ISOVUE-300 IOPAMIDOL (ISOVUE-300) INJECTION 61% COMPARISON:  CT of the abdomen and pelvis on 10/19/2005 FINDINGS: Lower chest:  Atelectasis at the posterior left lung base. Hepatobiliary: The large lesion occupying much of the right lobe of the liver shows globular peripheral enhancement with further increased central enhancement on delayed imaging. This is consistent with a large hemangioma and  causes superior capsular bulging into the right hemidiaphragm. Maximal dimensions are approximately 14.5 cm. This appears to have enlarged since prior CT in 2007 at which time maximum diameter was approximately 12 cm. No evidence to suggest active bleeding. The rest of the liver appears unremarkable. The gallbladder may contain a punctate calculus. No biliary ductal dilatation. Pancreas: No mass, inflammatory changes, or other significant abnormality. Spleen: Within normal limits in size and appearance. Adrenals/Urinary Tract: There is a solid-appearing mass at the anterior right lower kidney measuring approximately 1.7 x 2.1 cm on the delayed phase. This mass is largely endophytic and has a minimal exophytic component. Density measurement on the venous phase is consistent with solid tissue or hemorrhagic content. Further evaluation with MRI of would be warranted as this is concerning for renal carcinoma. Additional lower pole cyst just inferior to the solid lesion appears benign and measures approximately 1.3 cm. There is a benign appearing cyst of the superior left kidney measuring 2.4 cm. No hydronephrosis. The bladder is decompressed by a Foley catheter. Stomach/Bowel: No evidence of obstruction, inflammatory process, or abnormal fluid collections. Rectal tube present. No evidence of free air or abscess. Vascular/Lymphatic: No pathologically enlarged lymph nodes. No evidence of abdominal aortic aneurysm. Reproductive: No mass or other significant abnormality. Other: Right-sided subcutaneous axillo-femoral bypass graft is identified with open anastomosis to the right femoral artery. There also appears to be extension of a femoral bypass graft that extends below the inguinal ligament. Musculoskeletal:  Bony structures are unremarkable. IMPRESSION: 1. The dominant right lobe hepatic lesion is consistent with hemangioma and was previously demonstrated to represent hemangioma in 2007. Dimensions appear to have  increased with maximum diameter now of 14.5 cm compared to approximately 12 cm previously. There is no evidence by CT of hemorrhagic complication related to the giant hemangioma. 2. Incidental detection of a solid-appearing right renal mass measuring approximately 2.1 cm and emanating from the lower pole anteriorly. This is concerning for renal carcinoma. Further evaluation with MRI may be helpful to characterize the lesion and its enhancement pattern. 3. Potential tiny gallstone in the gallbladder. 4. Patent right-sided axillo femoral bypass graft. Electronically Signed   By: Aletta Edouard M.D.   On: 09/13/2015 23:55   Dg Chest Port 1 View  09/14/2015  CLINICAL DATA:  Intubation. EXAM: PORTABLE CHEST 1 VIEW COMPARISON:  09/13/2015. FINDINGS: Endotracheal tube, NG tube, bilateral IJ lines in unchanged position. Right IJ line dual-lumen catheter tip is in the right atrium. Heart size normal. Unchanged right upper lung nodular density. Lung volumes with bibasilar atelectasis. No pleural effusion pneumothorax . IMPRESSION: 1. Lines and tubes in stable position. 2.  Lung volumes with stable  bibasilar atelectasis. 3.  Unchanged right upper lobe nodular density. Electronically Signed   By: Marcello Moores  Register   On: 09/14/2015 07:10     STUDIES:  04/04 > CXR with new RUL pulmonary nodule  04/04 > 2.4 x 2.3 x 2.0 cm RUL nodule. 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  04/05 > MRI brain Symmetric abnormal hippocampi signal compatible status epilepticus, and postictal state.  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  Old LEFT thalamus and LEFT basal ganglia lacunar infarcts. Old RIGHT internal capsule  infarct. Old tiny cerebellar infarcts. 04/05 > CT abdomen: 1. The dominant right lobe hepatic lesion is consistent with hemangioma and was previously demonstrated to represent hemangioma in 2007. Dimensions appear to have increased with maximum diameter now of 14.5 cm compared to approximately 12 cm previously. There is no evidence by CT of hemorrhagic complication related to the giant Hemangioma.  2. Incidental detection of a solid-appearing right renal mass measuring approximately 2.1 cm and emanating from the lower pole anteriorly. This is concerning for renal carcinoma.   CULTURES: Blood 04/04 > NGTD x 2 days Urine 04/05 > NG Final Tracheal aspirate 04/05 > no organisms seen on Gram stain, Culture with normal oropharyngeal flora C. Diff antigen positive, toxin negative 04/07   ANTIBIOTICS: Vancomycin 04/04 > 04/07 Zosyn 04/04 > 04/07  LINES/TUBES: CVC 04/04 > HD Cath Right IJ 3/20 > NG/OG Tube 04/04 > Foley 04/04 > Rectal Tube 04/07 >  ASSESSMENT / PLAN: 60 year old man with ESRD presenting with acute encephalopathy found to have a new RUL lung nodule that may be suspicious for metastatic disease.  Also with new onset seizures of unclear etiology.  PULMONARY A: Acute hypoxic hypercarbic respiratory failure likely secondary to AMS/seizure COPD Lung nodule RUL P:   - continue mechanical ventilation, not ready to wean.  FiO2 40% to keep Oxygen saturation above 91% - daily CXR while intubated - nebulizers for COPD - Pulmicort BID, Duonebs QID - may need biopsy of lung nodule   CARDIOVASCULAR A:  History of PAD SVT on admission with HR 180 P:  - Metoprolol 2.5-5mg  IV Q3H prn  RENAL A:   ESRD on HD TThSat Hyponatremia Renal mass seen on CT concerning for renal carcinoma P:   - nephrology following - HD per nephrology - renal function panel daily - consider MRI to better assess renal mass pending GOC discussion with family  GASTROINTESTINAL A:   Intubated in  ICU Moderate, Non-severe Protein Calorie Malnutrition P:   - Famotidine 20mg  per tube Q24H - NPO - Tube feeds, nutrition consult  HEMATOLOGIC A:   Liver lesion with new RUL lung nodule suspicious for metastatic neoplasm Thrombocytopenia P:  - RUL lung nodule, likely to need percutaneous biopsy - CT abdomen with renal mass, liver hemangioma - DVT PPx > Heparin - Monitor platelets, stop zosyn  INFECTIOUS A:   Leukoctyosis - improving Fevers - Tmax last 24 hours 103, unclear source of fevers with nothing suggestive of infection from pan-cultures.  Possibly due to seizures or drug reaction. P:   - follow up blood cx, currently no growth at day 2 - tracheal aspirate culture with no organisms on Gram stain, normal oropharyngeal flora - discontinue vanc and  zosyn and observe off antibiotics.  Low threshold to restart - follow WBC, fever curve - WOC for chronic pressure ulcer  ENDOCRINE A:   Insulin dependent DM2  Hypoglycemia  P:   - CBG Q4H - stop Lantus - SSI  NEUROLOGIC A:   New onset seizures, unclear etiology Status epilepticus Fevers - as above may be neurologically mediated P:  - Neurology consult appreciated - AED's per neurology - currently on Vimpat, Keppra, Valproate, Topamax - Propofol gtt being held - EEG with severe encephalopathy and multifocal cortical irritibility, continue with EEG per neurology - MRI brain results as above - RASS goal: -1    FAMILY  - Updates: Wife -  [name] can be reached at [phone] or [name] [phone]   Pulmonary and [place] Pager: [phone]  09/14/2015, 11:56 AM

## 2015-09-14 NOTE — Progress Notes (Signed)
Patient returned to full support due to no patient effort during SBT. Vent settings PRVC 550/14/+5/40%. RT will continue to monitor patient.

## 2015-09-14 NOTE — Procedures (Signed)
  Electroencephalogram report- LTM   Data acquisition: 10-20 electrode placement.  Additional T1, T2, and EKG electrodes; 26 channel digital referential acquisition reformatted to 18 channel/7 channel coronal bipolar     Beginning time: 09/12/15 01;32 55 am Ending time: 4/07/ 17  07 22 05 am  Day of study: day limited , day 2, day 3  limited    This  intensive EEG monitoring with simultaneous video monitoring was performed for this patient with convulsions.  Medications: intubated and sedated with propofol  Day 1: During first hour of the recording the Background activities were marked by  broad 1-2 cps spikes and polyspike/wave discharges with posterior dominance. This pattern changed to more focal and continuous electrographic seizures arising from right occipital cortex. During last portion of the recoding background become more attenuated with superimposed broad periodic sharp waves alternating with runs of more continuous delta and theta slowing . Within these theta runs, there is rhythmic 5-6 cps right post temporal rhythm present.    Day 2: during first few hours of the recording 1-2 cps GPEDs were present, following by achievement of burst  suppression pattern with 6/3 burst suppression  ration. Burst c/w quasi rhythmic theta which preceded consistently by broad sharp wave following  by 1/2 sec attenuation. In addition within that burst independent left fronto central and right fronto central spikes were present; left fronto central spikes tend to occur in train concerning for brief seizures.  Day 3: Background activities marked by high amplitude sharp delta waves sometimes scaring morphology of spike and polyspike and wave discharge this followed by half a second of attenuated background and followed by 5-7 seconds bursts of  quasi rhythmic theta activities.  Superimposed independent left and right frontocentral spikes were present.  Left fronto central spikes appear to be more frequent and  sometimes accompanied by short runs of spike and wave discharges, potentially could represent brief electrographic seizures  Clinical interpretation: This intensive EEG monitoring with simultaneous monitoring c/w resolution of continuous spike/wave discharges and emergence of the  burst suppression pattern as discussed above and frequent seizures. . These findings suggestive of a severe encephalopathy and  multifocal cortical irritability.

## 2015-09-14 NOTE — Progress Notes (Signed)
Hypoglycemic Event  CBG: 62  Treatment: D50 IV 50 mL  Symptoms: None  Follow-up CBG: 0427 CBG Result:107  Possible Reasons for Event: Unknown  Comments/MD notified: Dr. Bevely Palmer, Philis Nettle

## 2015-09-14 NOTE — Progress Notes (Signed)
Patient noted some left sided posturing intermittently, and slight tongue protusion.. Patient withdraws to pain , Dr. Heber Apache was notified and in to assessed patient. Ativan 2mg . IV PRN given as ordered. Noted some relief after the dose. Will follow up with the  Neurologist, will continue to monitor patient.

## 2015-09-15 ENCOUNTER — Inpatient Hospital Stay (HOSPITAL_COMMUNITY): Payer: Medicare Other

## 2015-09-15 LAB — CBC
HCT: 34.8 % — ABNORMAL LOW (ref 39.0–52.0)
Hemoglobin: 10.4 g/dL — ABNORMAL LOW (ref 13.0–17.0)
MCH: 27.1 pg (ref 26.0–34.0)
MCHC: 29.9 g/dL — AB (ref 30.0–36.0)
MCV: 90.6 fL (ref 78.0–100.0)
PLATELETS: 105 10*3/uL — AB (ref 150–400)
RBC: 3.84 MIL/uL — ABNORMAL LOW (ref 4.22–5.81)
RDW: 16.9 % — ABNORMAL HIGH (ref 11.5–15.5)
WBC: 10.6 10*3/uL — ABNORMAL HIGH (ref 4.0–10.5)

## 2015-09-15 LAB — CULTURE, RESPIRATORY

## 2015-09-15 LAB — GLUCOSE, CAPILLARY
GLUCOSE-CAPILLARY: 98 mg/dL (ref 65–99)
GLUCOSE-CAPILLARY: 111 mg/dL — AB (ref 65–99)
GLUCOSE-CAPILLARY: 110 mg/dL — AB (ref 65–99)
Glucose-Capillary: 123 mg/dL — ABNORMAL HIGH (ref 65–99)
Glucose-Capillary: 85 mg/dL (ref 65–99)
Glucose-Capillary: 89 mg/dL (ref 65–99)

## 2015-09-15 LAB — BASIC METABOLIC PANEL
Anion gap: 17 — ABNORMAL HIGH (ref 5–15)
BUN: 67 mg/dL — ABNORMAL HIGH (ref 6–20)
CHLORIDE: 95 mmol/L — AB (ref 101–111)
CO2: 23 mmol/L (ref 22–32)
Calcium: 7.9 mg/dL — ABNORMAL LOW (ref 8.9–10.3)
Creatinine, Ser: 7.2 mg/dL — ABNORMAL HIGH (ref 0.61–1.24)
GFR, EST AFRICAN AMERICAN: 9 mL/min — AB (ref >60)
GFR, EST NON AFRICAN AMERICAN: 7 mL/min — AB (ref >60)
Glucose, Bld: 104 mg/dL — ABNORMAL HIGH (ref 65–99)
POTASSIUM: 3 mmol/L — AB (ref 3.5–5.1)
SODIUM: 135 mmol/L (ref 135–145)

## 2015-09-15 LAB — CULTURE, RESPIRATORY W GRAM STAIN: Culture: NORMAL

## 2015-09-15 LAB — TRIGLYCERIDES: TRIGLYCERIDES: 127 mg/dL (ref <150)

## 2015-09-15 LAB — MAGNESIUM: MAGNESIUM: 2.1 mg/dL (ref 1.7–2.4)

## 2015-09-15 LAB — PHOSPHORUS: PHOSPHORUS: 6.1 mg/dL — AB (ref 2.5–4.6)

## 2015-09-15 MED ORDER — SODIUM CHLORIDE 0.9 % IV SOLN
100.0000 mg | INTRAVENOUS | Status: DC
  Administered 2015-09-15: 100 mg via INTRAVENOUS
  Filled 2015-09-15 (×5): qty 10

## 2015-09-15 MED ORDER — POTASSIUM CHLORIDE 10 MEQ/100ML IV SOLN
10.0000 meq | INTRAVENOUS | Status: AC
  Administered 2015-09-15 (×4): 10 meq via INTRAVENOUS
  Filled 2015-09-15 (×4): qty 100

## 2015-09-15 MED ORDER — SODIUM CHLORIDE 0.9 % IV SOLN
500.0000 mg | INTRAVENOUS | Status: DC
  Administered 2015-09-15: 500 mg via INTRAVENOUS
  Filled 2015-09-15 (×3): qty 5

## 2015-09-15 MED ORDER — HEPARIN SODIUM (PORCINE) 1000 UNIT/ML DIALYSIS: 1000.0000 [IU] | INTRAMUSCULAR | Status: DC | PRN

## 2015-09-15 MED ORDER — SODIUM CHLORIDE 0.9 % IV SOLN: 100.0000 mL | INTRAVENOUS | Status: DC | PRN

## 2015-09-15 MED ORDER — LIDOCAINE HCL (PF) 1 % IJ SOLN: 5.0000 mL | INTRAMUSCULAR | Status: DC | PRN

## 2015-09-15 MED ORDER — SODIUM CHLORIDE 0.9 % IV SOLN
INTRAVENOUS | Status: DC
  Administered 2015-09-15: 16:00:00 via INTRAVENOUS

## 2015-09-15 MED ORDER — LIDOCAINE-PRILOCAINE 2.5-2.5 % EX CREA
1.0000 "application " | TOPICAL_CREAM | CUTANEOUS | Status: DC | PRN
  Filled 2015-09-15: qty 5

## 2015-09-15 MED ORDER — PENTAFLUOROPROP-TETRAFLUOROETH EX AERO: 1.0000 "application " | INHALATION_SPRAY | CUTANEOUS | Status: DC | PRN

## 2015-09-15 MED ORDER — HEPARIN SODIUM (PORCINE) 1000 UNIT/ML DIALYSIS
3500.0000 [IU] | INTRAMUSCULAR | Status: DC | PRN
  Administered 2015-09-15: 3500 [IU] via INTRAVENOUS_CENTRAL

## 2015-09-15 MED ORDER — ALTEPLASE 2 MG IJ SOLR
2.0000 mg | Freq: Once | INTRAMUSCULAR | Status: DC | PRN
  Filled 2015-09-15: qty 2

## 2015-09-15 NOTE — Progress Notes (Signed)
CRITICAL VALUE ALERT  Critical value received:  K+ (3), Ca (7.9), Ph (6.1), Cr (7.2)  Date of notification:  09/15/2015  Time of notification:  0353  Critical value read back:No.  Nurse who received alert:  Ernest Popowski Thompson  MD notified (1st page):  [place]  Time of first page:  0355  MD notified (2nd page):  Time of second page:  Responding MD:  N/A  Time MD responded:  N/A

## 2015-09-15 NOTE — Progress Notes (Signed)
Dialysis treatment completed.  0 mL ultrafiltrated.  -500 mL net fluid removal.  Patient status unchanged. Lung sounds diminished to ausculation in all fields. No edema. Cardiac: NSR to ST.  Cleansed RIJ catheter with chlorhexidine.  Disconnected lines and flushed ports with saline per protocol.  Ports locked with heparin and capped per protocol.    Report given to bedside, RN Summer.

## 2015-09-15 NOTE — Procedures (Signed)
  Electroencephalogram report- LTM   Data acquisition: 10-20 electrode placement.  Additional T1, T2, and EKG electrodes; 26 channel digital referential acquisition reformatted to 18 channel/7 channel coronal bipolar     Beginning time: 09/12/15 01;32 55 am Ending time: 4/08/ 17  07 22 05 am  Day of study: day limited , day 2, day 3  limited  , day 4   This  intensive EEG monitoring with simultaneous video monitoring was performed for this patient with convulsions.  Medications: intubated and sedated with propofol  Day 1: During first hour of the recording the Background activities were marked by  broad 1-2 cps spikes and polyspike/wave discharges with posterior dominance. This pattern changed to more focal and continuous electrographic seizures arising from right occipital cortex. During last portion of the recoding background become more attenuated with superimposed broad periodic sharp waves alternating with runs of more continuous delta and theta slowing . Within these theta runs, there is rhythmic 5-6 cps right post temporal rhythm present.    Day 2: during first few hours of the recording 1-2 cps GPEDs were present, following by achievement of burst  suppression pattern with 6/3 burst suppression  ration. Burst c/w quasi rhythmic theta which preceded consistently by broad sharp wave following  by 1/2 sec attenuation. In addition within that burst independent left fronto central and right fronto central spikes were present; left fronto central spikes tend to occur in train concerning for brief seizures.  Day 3: Background activities marked by high amplitude sharp delta waves sometimes scaring morphology of spike and polyspike and wave discharge this followed by half a second of attenuated background and followed by 5-7 seconds bursts of  quasi rhythmic theta activities.  Superimposed independent left and right fronto central spikes were present.  Left fronto central spikes appear to be more  frequent and sometimes accompanied by short runs of spike and wave discharges, potentially could represent brief electrographic seizures  Day 4: No clinical or subclinical seizures. More continuous background delta activities  With emergence of theta frequencies. Occasional broad sharp delta wave were present but infrequently.   Clinical interpretation: This intensive EEG monitoring with simultaneous monitoring c/w resolution of status epilepticus and improvement in background activities.  Background activities are continuous delta and theta slowing suggestive of encephalopathy.  Presence of occasional broad sharply contoured delta waves suggestive of still some degree of cortical irritability. Clinical correlation is advised.

## 2015-09-15 NOTE — Progress Notes (Signed)
Interval History:                                                                                                                      John [name] is an 60 y.o. male patient with status epilepticus on EEG, now resolved with a seizure medications although the EEG background shows moderate to severe encephalopathy . No clinical seizure like events. No family at bedside..   Past Medical History: Past Medical History  Diagnosis Date  . COPD (chronic obstructive pulmonary disease) ([place])   . Chronic back pain     Chronic narcotics for this for many years  . Hyperlipidemia   . Foot pain   . Peripheral vascular disease ([place])   . Asthma   . Ulcer 1985  . Fracture of foot     [place]- Right  . Neuromuscular disorder ([place])     periferal neuropathy  . Chronic kidney disease     CKD stage III  . Hypertension     not taking any medication  . Stroke ([place])     No residual "Mini strokes"  . Diabetes mellitus     Type II  . Arthritis     Past Surgical History  Procedure Laterality Date  . Neck surgery      "Titatium Plate"  . Eye surgery    . Cardiac catheterization    . Endarterectomy  12/18/2011    Procedure: ENDARTERECTOMY CAROTID;  Surgeon: Angelia Mould, MD;  Location: [place];  Service: Vascular;  Laterality: Left;  . Carotid endarterectomy Right   . Axillary-femoral bypass graft  06/18/2012    Procedure: BYPASS GRAFT AXILLA-BIFEMORAL;  Surgeon: Angelia Mould, MD;  Location: [place];  Service: Vascular;  Laterality: Right;  Right Axillo to Right Femoral Bypass Graft  . Femoral-popliteal bypass graft  06/18/2012    Procedure: BYPASS GRAFT FEMORAL-POPLITEAL ARTERY;  Surgeon: Angelia Mould, MD;  Location[facility] OR;  Service: Vascular;  Laterality: Right;  Right Femoral to Above Knee Popliteal Bypass Graft  . Amputation  06/18/2012    Procedure: AMPUTATION DIGIT;  Surgeon: Angelia Mould, MD;  Location: [facility] OR;  Service: Vascular;  Laterality: Right;  Right side,  amputation of fourth and fifth toes  . Av fistula placement  06/24/2012    Procedure: ARTERIOVENOUS (AV) FISTULA CREATION;  Surgeon: Angelia Mould, MD;  Location: [place];  Service: Vascular;  Laterality: Left;  . Lower extremity angiogram Right 11/21/2011    Procedure: LOWER EXTREMITY ANGIOGRAM;  Surgeon: Elam Dutch, MD;  Location: [facility] CATH LAB;  Service: Cardiovascular;  Laterality: Right;  . Enucleation Left     trauma as a child. Wears prosthesis.   Marland Kitchen Below knee leg amputation Left     [facility] - Dr. Sabino Dick  . Amputation toe Left 2016    2 toes  . Transmetatarsal amputation Left 2016  . Ligation of arteriovenous  fistula Left 08/27/2015    Procedure: LIGATION OF Left Arm ARTERIOVENOUS  FISTULA;  Surgeon: Rosetta Posner, MD;  Location: [place];  Service: Vascular;  Laterality: Left;  . Insertion of dialysis catheter Left 08/27/2015    Procedure: INSERTION OF Right Internal Jugular DIALYSIS CATHETER;  Surgeon: Rosetta Posner, MD;  Location: [place];  Service: Vascular;  Laterality: Left;  . Back surgery      Family History: Family History  Problem Relation Age of Onset  . Diabetes Mother   . Heart disease Mother     before age 38  . Hypertension Mother   . Heart attack Mother   . Diabetes Father     Amputation  . Heart disease Father     Before age 80  . Hypertension Father   . Heart attack Father   . Other Father     bleeding problems  . Diabetes Brother   . Heart disease Brother     Before age 56  . Hypertension Brother   . Heart attack Brother     Social History:   reports that he has been smoking Cigarettes.  He has a 8 pack-year smoking history. He has never used smokeless tobacco. He reports that he does not drink alcohol or use illicit drugs.  Allergies:  Allergies  Allergen Reactions  . Nicoderm [Nicotine] Itching and Rash  . Gabapentin Other (See Comments)    Can't walk, shuts legs down  . Vicodin [Hydrocodone-Acetaminophen] Hives and Itching      Medications:                                                                                                                         Current facility-administered medications:  .  0.9 %  sodium chloride infusion, 100 mL, Intravenous, PRN, Roney Jaffe, MD, Stopped at 09/13/15 2130 .  0.9 %  sodium chloride infusion, 100 mL, Intravenous, PRN, Roney Jaffe, MD .  0.9 %  sodium chloride infusion, 100 mL, Intravenous, PRN, Roney Jaffe, MD .  0.9 %  sodium chloride infusion, 100 mL, Intravenous, PRN, Roney Jaffe, MD .  0.9 %  sodium chloride infusion, , Intravenous, Continuous, Jose Angelo A de Larkin Ina, MD, Last Rate: 10 mL/hr at 09/15/15 1600 .  acetaminophen (TYLENOL) solution 650 mg, 650 mg, Per Tube, Q6H PRN, Raylene Miyamoto, MD, 650 mg at 09/13/15 1915 .  alteplase (CATHFLO ACTIVASE) injection 2 mg, 2 mg, Intracatheter, Once PRN, Roney Jaffe, MD .  antiseptic oral rinse solution (CORINZ), 7 mL, Mouth Rinse, QID, Juanito Doom, MD, 7 mL at 09/15/15 1523 .  budesonide (PULMICORT) nebulizer solution 0.5 mg, 0.5 mg, Nebulization, BID, Jose Angelo A Stewardson, MD, 0.5 mg at 09/15/15 1953 .  chlorhexidine gluconate (SAGE KIT) (PERIDEX) 0.12 % solution 15 mL, 15 mL, Mouth Rinse, BID, Juanito Doom, MD, 15 mL at 09/15/15 0811 .  collagenase (SANTYL) ointment, , Topical, Daily, Blane Ohara McDiarmid, MD, 1 application at 91/50/56 1008 .  famotidine (PEPCID) tablet 20 mg, 20 mg, Per Tube, Daily, Janett Billow  B Millen, RPH, 20 mg at 09/15/15 1008 .  feeding supplement (NEPRO CARB STEADY) liquid 1,000 mL, 1,000 mL, Per Tube, Q24H, Jose Angelo A Corrie Dandy, MD, Last Rate: 35 mL/hr at 09/15/15 1337, 1,000 mL at 09/15/15 1337 .  feeding supplement (PRO-STAT SUGAR FREE 64) liquid 30 mL, 30 mL, Per Tube, QID, Jose Angelo A Corrie Dandy, MD, 30 mL at 09/15/15 1650 .  fentaNYL (SUBLIMAZE) injection 100 mcg, 100 mcg, Intravenous, Q2H PRN, Juanito Doom, MD, 100 mcg at 09/15/15 0505 .  heparin injection  1,000 Units, 1,000 Units, Dialysis, PRN, Roney Jaffe, MD .  Derrill Memo ON 09/16/2015] heparin injection 3,500 Units, 3,500 Units, Dialysis, PRN, Roney Jaffe, MD, 3,500 Units at 09/15/15 1032 .  heparin injection 5,000 Units, 5,000 Units, Subcutaneous, 3 times per day, Maryellen Pile, MD, 5,000 Units at 09/15/15 1336 .  insulin aspart (novoLOG) injection 0-15 Units, 0-15 Units, Subcutaneous, 6 times per day, Maryellen Pile, MD, 2 Units at 09/15/15 1616 .  ipratropium-albuterol (DUONEB) 0.5-2.5 (3) MG/3ML nebulizer solution 3 mL, 3 mL, Nebulization, QID, Jose Shirl Harris, MD, 3 mL at 09/15/15 1953 .  lacosamide (VIMPAT) 100 mg in sodium chloride 0.9 % 25 mL IVPB, 100 mg, Intravenous, Q T,Th,Sa-HD, Blane Ohara McDiarmid, MD, 100 mg at 09/15/15 1800 .  lacosamide (VIMPAT) 200 mg in sodium chloride 0.9 % 25 mL IVPB, 200 mg, Intravenous, Q12H, Taelon Bendorf Fuller Mandril, MD, 200 mg at 09/15/15 1412 .  levETIRAcetam (KEPPRA) 1,000 mg in sodium chloride 0.9 % 100 mL IVPB, 1,000 mg, Intravenous, Q24H, Jermisha Hoffart Fuller Mandril, MD, 1,000 mg at 09/15/15 1523 .  levETIRAcetam (KEPPRA) 500 mg in sodium chloride 0.9 % 100 mL IVPB, 500 mg, Intravenous, Q T,Th,Sa-HD, Blane Ohara McDiarmid, MD, 500 mg at 09/15/15 1800 .  lidocaine (PF) (XYLOCAINE) 1 % injection 5 mL, 5 mL, Intradermal, PRN, Roney Jaffe, MD .  lidocaine-prilocaine (EMLA) cream 1 application, 1 application, Topical, PRN, Roney Jaffe, MD .  LORazepam (ATIVAN) injection 2 mg, 2 mg, Intravenous, Q15 min PRN, Juanito Doom, MD, 2 mg at 09/14/15 0405 .  metoprolol (LOPRESSOR) injection 2.5-5 mg, 2.5-5 mg, Intravenous, Q3H PRN, Juanito Doom, MD, 5 mg at 10/04/2015 2310 .  pentafluoroprop-tetrafluoroeth (GEBAUERS) aerosol 1 application, 1 application, Topical, PRN, Roney Jaffe, MD .  propofol (DIPRIVAN) 1000 MG/100ML infusion, 0-50 mcg/kg/min, Intravenous, Continuous, Maryellen Pile, MD, Stopped at 09/14/15 0900 .  sodium chloride 0.9 % bolus  250 mL, 250 mL, Intravenous, Once, Corliss Parish, MD, 250 mL at 09/13/15 1945 .  topiramate (TOPAMAX) NICU ORAL suspension 6 mg/mL, 100 mg, Oral, Q12H, Durant Scibilia Fuller Mandril, MD, 100 mg at 09/15/15 1800 .  topiramate (TOPAMAX) NICU ORAL suspension 6 mg/mL, 100 mg, Oral, Q dialysis, Mareesa Gathright Fuller Mandril, MD, 100 mg at 09/13/15 1742 .  valproate (DEPACON) 400 mg in dextrose 5 % 50 mL IVPB, 400 mg, Intravenous, 4 times per day, Elizandro Laura Fuller Mandril, MD, 400 mg at 09/15/15 1304  Facility-Administered Medications Ordered in Other Encounters:  .  ferumoxytol (FERAHEME) 1,020 mg in sodium chloride 0.9 % 100 mL IVPB, 1,020 mg, Intravenous, Once, Corliss Parish, MD   Neurologic Examination:  Today's Vitals   09/15/15 1700 09/15/15 1935 09/15/15 1945 09/15/15 1948  BP: 137/75     Pulse: 105     Temp:  99.9 F (37.7 C)    TempSrc:  Oral    Resp: 23     Height:      Weight:      SpO2: 100%  100% 100%  PainSc:       Patient intubated, minimal withdrawal in upper extremities to stimulation.  No eye opening  Lab Results: Basic Metabolic Panel:  Recent Labs Lab 09/15/2015 1943  09/12/2015 2009 09/12/15 0438 09/13/15 0450 09/14/15 0532 09/15/15 0242  NA  --   --  140 144 142 133* 135  K  --   --  4.4 4.9 4.2 3.7 3.0*  CL  --   --  99* 102 99* 93* 95*  CO2  --   --  '22 26 24 24 23  ' GLUCOSE  --   --  129* 88 112* 70 104*  BUN  --   --  55* 63* 57* 42* 67*  CREATININE  --   --  7.40* 7.70* 6.55* 5.31* 7.20*  CALCIUM  --   < > 9.0 8.6* 8.2* 7.7* 7.9*  MG  --   --  2.2  --   --   --  2.1  PHOS 5.7*  --   --  6.9* 7.0* 5.2* 6.1*  < > = values in this interval not displayed.  Liver Function Tests:  Recent Labs Lab 09/08/2015 2009 09/12/15 0438 09/12/15 1029 09/13/15 0450 09/14/15 0532  AST 25  --  17  --   --   ALT 12*  --  10*  --   --   ALKPHOS 104  --  90   --   --   BILITOT 0.6  --  0.9  --   --   PROT 7.6  --  7.0  --   --   ALBUMIN 3.9 3.7 3.6 3.2* 2.9*   No results for input(s): LIPASE, AMYLASE in the last 168 hours.  Recent Labs Lab 09/23/2015 1952  AMMONIA 41*    CBC:  Recent Labs Lab 09/17/2015 2009 09/12/15 0438 09/13/15 0450 09/14/15 0500 09/15/15 0242  WBC 11.9* 19.0* 15.3* 14.7* 10.6*  NEUTROABS 9.1* 16.2*  --   --   --   HGB 12.8* 12.6* 11.7* 10.8* 10.4*  HCT 42.3 41.4 38.9* 34.5* 34.8*  MCV 92.8 92.4 92.4 93.5 90.6  PLT 137* 135* 123* 98* 105*    Cardiac Enzymes:  Recent Labs Lab 09/13/2015 1943  TROPONINI 0.15*    Lipid Panel:  Recent Labs Lab 09/12/15 09/14/15 2257  TRIG 148 127    CBG:  Recent Labs Lab 09/15/15 0029 09/15/15 0419 09/15/15 0758 09/15/15 1142 09/15/15 1607  GLUCAP 89 98 111* 5 123*    Microbiology: Results for orders placed or performed during the hospital encounter of 09/08/2015  Culture, blood (Routine X 2) w Reflex to ID Panel     Status: None (Preliminary result)   Collection Time: 09/21/2015  7:52 PM  Result Value Ref Range Status   Specimen Description BLOOD BLOOD RIGHT FOREARM  Final   Special Requests IN PEDIATRIC BOTTLE 3CC  Final   Culture NO GROWTH 4 DAYS  Final   Report Status PENDING  Incomplete  Culture, blood (Routine X 2) w Reflex to ID Panel     Status: None (Preliminary result)   Collection Time: 09/15/2015  7:57 PM  Result Value Ref Range Status   Specimen Description BLOOD RIGHT HAND  Final   Special Requests BOTTLES DRAWN AEROBIC ONLY [place]  Final   Culture NO GROWTH 4 DAYS  Final   Report Status PENDING  Incomplete  MRSA PCR Screening     Status: None   Collection Time: 09/12/15  2:00 AM  Result Value Ref Range Status   MRSA by PCR NEGATIVE NEGATIVE Final    Comment:        The GeneXpert MRSA Assay (FDA approved for NASAL specimens only), is one component of a comprehensive MRSA colonization surveillance program. It is not intended to diagnose  MRSA infection nor to guide or monitor treatment for MRSA infections.   Culture, respiratory (NON-Expectorated)     Status: None   Collection Time: 09/12/15 10:03 AM  Result Value Ref Range Status   Specimen Description TRACHEAL ASPIRATE  Final   Special Requests NONE  Final   Gram Stain   Final    RARE WBC PRESENT, PREDOMINANTLY PMN RARE SQUAMOUS EPITHELIAL CELLS PRESENT NO ORGANISMS SEEN Performed at [facility]    Culture   Final    NORMAL OROPHARYNGEAL FLORA Performed at [facility]    Report Status 09/15/2015 FINAL  Final  Culture, Urine     Status: None   Collection Time: 09/12/15 10:57 AM  Result Value Ref Range Status   Specimen Description URINE, CATHETERIZED  Final   Special Requests NONE  Final   Culture NO GROWTH 1 DAY  Final   Report Status 09/13/2015 FINAL  Final  C difficile quick scan w PCR reflex     Status: Abnormal   Collection Time: 09/14/15  1:11 AM  Result Value Ref Range Status   C Diff antigen POSITIVE (A) NEGATIVE Final   C Diff toxin NEGATIVE NEGATIVE Final   C Diff interpretation   Final    C. difficile present, but toxin not detected. This indicates colonization. In most cases, this does not require treatment. If patient has signs and symptoms consistent with colitis, consider treatment. Requires ENTERIC precautions.    Imaging: Mr Herby Abraham Contrast  09/14/2015  ADDENDUM REPORT: 09/14/2015 00:44 ADDENDUM: Subacute RIGHT posterior temporal lobe subcentimeter infarct. Electronically Signed   By: Elon Alas M.D.   On: 09/14/2015 00:44  09/14/2015  CLINICAL DATA:  Status epilepticus, postictal encephalopathy. History of posterior fossa stroke and LEFT vertebral artery occlusion. History of seizures, end-stage renal disease on dialysis, hypertension, hyperlipidemia, diabetes, stroke. EXAM: MRI HEAD WITHOUT CONTRAST TECHNIQUE: Multiplanar, multiecho pulse sequences of the brain and surrounding structures were obtained without  intravenous contrast. COMPARISON:  CT head September 11, 2015 FINDINGS: The ventricles and sulci are normal for patient's age. No mass lesions, mass effect. 1 cm reduced diffusion RIGHT posterior temporal lobe, normalized ADC values and bright FLAIR T2 hyperintense signal. Symmetric T2 bright FLAIR signal within the hippocampi bilaterally, corresponding reduced diffusion. No susceptibility artifact to suggest hemorrhage. Patchy T2 bright FLAIR signal within the bilateral cerebellum, in the periphery of the temporal lobes, periphery of the frontal parietal lobes. Old LEFT thalamus lacunar infarct. Old LEFT basal ganglia lacunar infarcts. Old LEFT anterior limb of the internal capsule infarct. Old tiny bilateral cerebellar infarcts. No abnormal extra-axial fluid collections. No extra-axial masses though, contrast enhanced sequences would be more sensitive. Normal major intracranial vascular flow voids seen at the skull base. Status post LEFT ocular globe prosthesis. No abnormal sellar expansion. No suspicious calvarial bone marrow signal. Craniocervical junction maintained. Status  post upper cervical ACDF, incompletely evaluated. Trace paranasal sinus mucosal thickening and mastoid effusions, life-support lines in place. IMPRESSION: Symmetric abnormal hippocampi signal compatible status epilepticus, and postictal state. 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 Old LEFT thalamus and LEFT basal ganglia lacunar infarcts. Old RIGHT internal capsule infarct. Old tiny cerebellar infarcts. These results will be called to the ordering clinician or representative by the Radiologist Assistant, and communication documented in the PACS or zVision Dashboard. Electronically Signed: By: Elon Alas M.D. On: 09/13/2015 23:54   Ct Abdomen Pelvis W Contrast  09/13/2015  CLINICAL DATA:  Liver lesions seen by chest CT.  Previous documentation of large hepatic hemangioma. EXAM: CT ABDOMEN AND PELVIS WITH CONTRAST TECHNIQUE: Multidetector CT imaging of the abdomen and pelvis was performed using the standard protocol following bolus administration of intravenous contrast. CONTRAST:  100 mL ISOVUE-300 IOPAMIDOL (ISOVUE-300) INJECTION 61% COMPARISON:  CT of the abdomen and pelvis on 10/19/2005 FINDINGS: Lower chest:  Atelectasis at the posterior left lung base. Hepatobiliary: The large lesion occupying much of the right lobe of the liver shows globular peripheral enhancement with further increased central enhancement on delayed imaging. This is consistent with a large hemangioma and causes superior capsular bulging into the right hemidiaphragm. Maximal dimensions are approximately 14.5 cm. This appears to have enlarged since prior CT in 2007 at which time maximum diameter was approximately 12 cm. No evidence to suggest active bleeding. The rest of the liver appears unremarkable. The gallbladder may contain a punctate calculus. No biliary ductal dilatation. Pancreas: No mass, inflammatory changes, or other significant abnormality. Spleen: Within normal limits in size and appearance. Adrenals/Urinary Tract: There is a solid-appearing mass at the anterior right lower kidney measuring approximately 1.7 x 2.1 cm on the delayed phase. This mass is largely endophytic and has a minimal exophytic component. Density measurement on the venous phase is consistent with solid tissue or hemorrhagic content. Further evaluation with MRI of would be warranted as this is concerning for renal carcinoma. Additional lower pole cyst just inferior to the solid lesion appears benign and measures approximately 1.3 cm. There is a benign appearing cyst of the superior left kidney measuring 2.4 cm. No hydronephrosis. The bladder is decompressed by a Foley catheter. Stomach/Bowel: No evidence of obstruction, inflammatory process, or abnormal fluid collections.  Rectal tube present. No evidence of free air or abscess. Vascular/Lymphatic: No pathologically enlarged lymph nodes. No evidence of abdominal aortic aneurysm. Reproductive: No mass or other significant abnormality. Other: Right-sided subcutaneous axillo-femoral bypass graft is identified with open anastomosis to the right femoral artery. There also appears to be extension of a femoral bypass graft that extends below the inguinal ligament. Musculoskeletal:  Bony structures are unremarkable. IMPRESSION: 1. The dominant right lobe hepatic lesion is consistent with hemangioma and was previously demonstrated to represent hemangioma in 2007. Dimensions appear to have increased with maximum diameter now of 14.5 cm compared to approximately 12 cm previously. There is no evidence by CT of hemorrhagic complication related to the giant hemangioma. 2. Incidental detection of a solid-appearing right renal mass measuring approximately 2.1 cm and emanating from the lower pole anteriorly. This is concerning for renal carcinoma. Further evaluation with MRI may be helpful to characterize the lesion and its enhancement pattern. 3. Potential tiny gallstone in the gallbladder. 4. Patent right-sided axillo femoral bypass graft. Electronically Signed   By: Aletta Edouard M.D.   On: 09/13/2015 23:55  Dg Chest Port 1 View  09/15/2015  CLINICAL DATA:  60 year old male with a history of COPD hypertension and diabetes. Admission 09/11/2002 with acute encephalopathy in seizures. History of right ax fem bypass EXAM: PORTABLE CHEST 1 VIEW COMPARISON:  Multiple prior chest x-ray most recent 09/14/2015. CT chest 09/10/2015 FINDINGS: Cardiomediastinal silhouette unchanged in size and contour. No evidence of central vascular congestion. Coarsened interstitial markings again noted. Similar appearance of soft tissue nodule right chest, better characterized on prior CT. Unchanged position of right IJ approach hemodialysis catheter, terminating in  these upper right atrium. Unchanged position of left IJ approach central venous catheter appearing to terminate superior vena cava. Unchanged position of endotracheal tube, terminating suitably above the carina measuring approximately 4 cm. Unchanged gastric tube. Surgical changes of the cervical region. Calcifications of the carotid vasculature. IMPRESSION: Similar appearance of the chest x-ray to the prior with unchanged support apparatus, as above. Unchanged aeration of the lungs, without new airspace disease. No pneumothorax. Right lung nodule better characterized on prior CT. Signed, Dulcy Fanny. Earleen Newport, DO [facility] Electronically Signed   By: Corrie Mckusick D.O.   On: 09/15/2015 07:49   Dg Chest Port 1 View  09/14/2015  CLINICAL DATA:  Intubation. EXAM: PORTABLE CHEST 1 VIEW COMPARISON:  09/13/2015. FINDINGS: Endotracheal tube, NG tube, bilateral IJ lines in unchanged position. Right IJ line dual-lumen catheter tip is in the right atrium. Heart size normal. Unchanged right upper lung nodular density. Lung volumes with bibasilar atelectasis. No pleural effusion pneumothorax . IMPRESSION: 1. Lines and tubes in stable position. 2.  Lung volumes with stable bibasilar atelectasis. 3.  Unchanged right upper lobe nodular density. Electronically Signed   By: Marcello Moores  Register   On: 09/14/2015 07:10   Dg Chest Port 1 View  09/13/2015  CLINICAL DATA:  Respiratory failure. EXAM: PORTABLE CHEST 1 VIEW COMPARISON:  09/12/2015. FINDINGS: Endotracheal tube, NG tube, dual-lumen right IJ, left IJ catheter in stable position. Heart size normal. Low lung volumes with mild basilar atelectasis. No pleural effusion or pneumothorax. IMPRESSION: 1. Lines and tubes in stable position. 2. Low lung volumes with mild bibasilar atelectasis and/or infiltrate. 3. Persistent right upper lobe nodular lesion, unchanged. Electronically Signed   By: Marcello Moores  Register   On: 09/13/2015  07:21   Portable Chest Xray  09/12/2015  CLINICAL DATA:  Hypoxia EXAM: PORTABLE CHEST 1 VIEW COMPARISON:  September 11, 2015 FINDINGS: Endotracheal tube tip is 4.7 cm above the carina. Nasogastric tube tip and side port are below the diaphragm. Dual-lumen central catheter tip is in the right atrium. Left jugular catheter tip is in the left innominate vein near the junction with the superior vena cava. No pneumothorax. There is no edema or consolidation. The previously noted nodular lesion in the right upper lobe is again noted, measuring 2.4 x 2.2 cm. The heart size and pulmonary vascularity are normal. No adenopathy. No demonstrable bone lesions. IMPRESSION: Tube and catheter positions as described without pneumothorax. Stable right upper lobe nodular lesion. No edema or consolidation. Electronically Signed   By: Lowella Grip III M.D.   On: 09/12/2015 07:00   Dg Chest Port 1 View  10/07/2015  CLINICAL DATA:  Respiratory distress. New left internal jugular catheter and orogastric tube placement. EXAM: PORTABLE CHEST 1 VIEW COMPARISON:  Radiograph earlier this day at [facility] hour, chest CT 09/10/2015 FINDINGS: Endotracheal tube 4.5 cm from the carina. Enteric tube in place, tip and side port below the diaphragm. Left internal jugular  tip in the region of the proximal SVC/ distal brachiocephalic vein. No pneumothorax. Right-sided dialysis catheter tips at the atrial caval junction/right atrium. Mild cardiomegaly and mediastinal contours are unchanged. No pulmonary edema. Right lung nodule again seen. No new airspace disease. No large pleural effusion. IMPRESSION: 1. Endotracheal and enteric tubes in place. 2. Tip of the left internal jugular central line in the region the proximal SVC/distal brachiocephalic vein. No pneumothorax. Electronically Signed   By: Jeb Levering M.D.   On: 09/19/2015 23:42    Assessment and plan:   SAIGE BUSBY is an 60 y.o. male patient with status epilepticus noted on EEG, now  resolved with seizure medications on Depacon 400 mg every 6 hours, Keppra 1 g every 24 hours with additional 500 mg post hemodialysis, Vimpat 200 mg twice a day with 100 mg additional dose after hemodialysis, Topamax 100 mg every 12 hours with 100 mg after hemodialysis additional dose. As the EEG has shown resolution of the seizures, at this time do not recommend any further change to his seizure medications. We'll continue long-term EEG monitoring. Requested nursing staff to arrange for a family meeting tomorrow at family is available to further discuss about his neurologic assessment and care goals. We'll follow-up.

## 2015-09-15 NOTE — Progress Notes (Signed)
Arrived to patient room 68M-03 at 0905.  Reviewed treatment plan and this RN agrees with plan.  Report received from bedside RN, Summer.  Consent verified.  Portable RO chloramines checked and levels less than 0.02.  Patient intubated, ventilated, responds to pain.   Lung sounds diminished, rhonchi to ausculation in all fields. No edema. Cardiac:  NSR to ST.  Removed caps and cleansed RIJ catheter with chlorhedxidine.  Aspirated ports of heparin and flushed them with saline per protocol.  Connected and secured lines, initiated treatment at 0922.  UF Goal of 55mL and net fluid removal +500 mL.  Will continue to monitor.

## 2015-09-15 NOTE — Progress Notes (Signed)
PULMONARY / CRITICAL CARE MEDICINE   Name: NICHOALS COLASUONNO MRN: [id number] DOB: 08/01/1955    ADMISSION DATE:  09/10/2015 CONSULTATION DATE:  10/02/2015  REFERRING MD:  [facility]  CHIEF COMPLAINT:  Acute encephalopathy/Seizures  HISTORY OF PRESENT ILLNESS:   Mr. Venditto is a 60 year old man with a past medical history significant for ESRD on HD TTS, Insulin Dependent Type 2 DM, PAD s/p left BKA, COPD, CKD Stage III, Stroke/TIAs presented with lethargy and acute encephalopathy. Patient missed Thursday and Saturday dialysis sessions (prior notes say transportation issues, family reports clotting at dialysis). Did receive 2 hours of dialysis today but ended early due to clotting off. Family reports first noticing a change in mental status yesterday with no improvement after dialysis today. Patient was seen in [facility] today and was able to converse with them and answer questions but was very confused. He was sent for direct admission from clinic. He had a brief seizure while in CT scan on arrival with another seizure once he arrived to the floor. Did not receive any medications as the seizures lasted <1 minute. Mental status has deteriorated and patient is now somnolent and only responds to painful stimuli. No history of EOTH use or cirrhosis.   Found to have new RUL lung nodule with a large right lobe liver lesion. CT of his head shows old infarcts. Patient's mental status has not improved following the seizures and PCCM was called to evaluate the patient.   SUBJECTIVE:  Patient is sedated and intubated on a ventilator.  No note of further seizures. Some eye opening.  Does not follow commads.   VITAL SIGNS: BP 128/81 mmHg  Pulse 88  Temp(Src) 98.9 F (37.2 C) (Oral)  Resp 17  Ht 5\' 8"  (1.727 m)  Wt 159 lb 6.3 oz (72.3 kg)  BMI 24.24 kg/m2  SpO2 100%  HEMODYNAMICS:    VENTILATOR SETTINGS: Vent Mode:  [-] PRVC FiO2 (%):  [40 %] 40 % Set Rate:  [14 bmp] 14 bmp Vt  Set:  [550 mL] 550 mL PEEP:  [5 cmH20] 5 cmH20 Pressure Support:  [10 cmH20] 10 cmH20 Plateau Pressure:  [18 cmH20-26 cmH20] 26 cmH20  INTAKE / OUTPUT: I/O last 3 completed shifts: In: 3599.3 [I.V.:714.8; Other:270; DB:[id number]; IV [id number] Out: 375 [Urine:225; Stool:150]  PHYSICAL EXAMINATION: General:  Ill-appearing man, lying in bed, sedated, intubated, eyes open. Does not follow commnads Neuro:  CN grossly intact. (-) szes noted  HEENT:  Prosthetic LEFT eye, RIGHT pupil PERRL Cardiovascular:  RRR, no m/r/g Lungs:  Ventilator assisted breaths CTAB anteriorly Abdomen:  Soft, non-tender, BS+ Skin:  Left BKA, anterior left knee with unstageable 4x2 cm scabbed wound with no odor/drainage.  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  Present prior to admission.  Left hand with dry gangrenous changes to 3rd and 5th digits.  Left AV fistula with palpable thrill, no erythema.  Right chest wall with bandage over HD catheter site.  This area looks clean.  LABS:  BMET  Recent Labs Lab 09/13/15 0450 09/14/15 0532 09/15/15 0242  NA 142 133* 135  K 4.2 3.7 3.0*  CL 99* 93* 95*  CO2 24 24 23   BUN 57* 42* 67*  CREATININE 6.55* 5.31* 7.20*  GLUCOSE 112* 70 104*    Electrolytes  Recent Labs Lab 09/23/2015 2009  09/13/15 0450 09/14/15 0532 09/15/15 0242  CALCIUM 9.0  < > 8.2* 7.7* 7.9*  MG 2.2  --   --   --  2.1  PHOS  --   < > 7.0* 5.2* 6.1*  < > = values in this interval not displayed.  CBC  Recent Labs Lab 09/13/15 0450 09/14/15 0500 09/15/15 0242  WBC 15.3* 14.7* 10.6*  HGB 11.7* 10.8* 10.4*  HCT 38.9* 34.5* 34.8*  PLT 123* 98* 105*    Coag's  Recent Labs Lab 09/12/15 1029  INR 1.28    Sepsis Markers  Recent Labs Lab 09/29/2015 1952 09/12/15 1029  LATICACIDVEN 6.0* 1.1    ABG  Recent Labs Lab 09/12/15 0038 09/12/15 0453  PHART 7.285* 7.375  PCO2ART 50.7* 42.7  PO2ART 132* 378*    Liver Enzymes  Recent Labs Lab 09/24/2015 2009   09/12/15 1029 09/13/15 0450 09/14/15 0532  AST 25  --  17  --   --   ALT 12*  --  10*  --   --   ALKPHOS 104  --  90  --   --   BILITOT 0.6  --  0.9  --   --   ALBUMIN 3.9  < > 3.6 3.2* 2.9*  < > = values in this interval not displayed.  Cardiac Enzymes  Recent Labs Lab 09/13/2015 1943  TROPONINI 0.15*    Glucose  Recent Labs Lab 09/14/15 0735 09/14/15 1157 09/14/15 1517 09/14/15 2035 09/15/15 0029 09/15/15 0419  GLUCAP 79 87 111* 122* 89 98    Imaging Dg Chest Port 1 View  09/15/2015  CLINICAL DATA:  60 year old male with a history of COPD hypertension and diabetes. Admission 09/11/2002 with acute encephalopathy in seizures. History of right ax fem bypass EXAM: PORTABLE CHEST 1 VIEW COMPARISON:  Multiple prior chest x-ray most recent 09/14/2015. CT chest 09/10/2015 FINDINGS: Cardiomediastinal silhouette unchanged in size and contour. No evidence of central vascular congestion. Coarsened interstitial markings again noted. Similar appearance of soft tissue nodule right chest, better characterized on prior CT. Unchanged position of right IJ approach hemodialysis catheter, terminating in these upper right atrium. Unchanged position of left IJ approach central venous catheter appearing to terminate superior vena cava. Unchanged position of endotracheal tube, terminating suitably above the carina measuring approximately 4 cm. Unchanged gastric tube. Surgical changes of the cervical region. Calcifications of the carotid vasculature. IMPRESSION: Similar appearance of the chest x-ray to the prior with unchanged support apparatus, as above. Unchanged aeration of the lungs, without new airspace disease. No pneumothorax. Right lung nodule better characterized on prior CT. Signed, Dulcy Fanny. Earleen Newport, DO [facility] Radiology Electronically Signed   By: Corrie Mckusick D.O.   On: 09/15/2015 07:49     STUDIES:  04/04 > CXR with new RUL pulmonary  nodule  04/04 > 2.4 x 2.3 x 2.0 cm RUL nodule. 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  Also with new onset seizures of unclear etiology.  PULMONARY A:  Acute hypoxic hypercarbic respiratory failure likely secondary to AMS/seizure/unable to protect airway COPD RUL lung nodule, likely CA P:   - continue mechanical ventilation > PST when he wakes up - nebulizers for COPD - Pulmicort BID, Duonebs QID  CARDIOVASCULAR A:  History of PAD SVT on admission with HR 180 P:  - Metoprolol 2.5-5mg  IV Q3H prn   RENAL A:   ESRD on HD TThSat P:   - consulted nephrology, HD TTHS - renal function panel daily  GASTROINTESTINAL A:   Intubated in ICU Moderate, Non-severe Protein Calorie Malnutrition P:   - Famotidine 20mg  IV Q24H - Tube feeds  HEMATOLOGIC/ONC A:   Liver lesion -- hemangioma which is chronic R Renal mass concerning for CA RUL nudule concerning for CA Thrombocytopenia P:  - hold off on bx 2/2 encephalopathy and poor prognosis with status  INFECTIOUS A:   Leukoctyosis - improving (-) fever x 24 hrs. Abx dc'd 4/6. P:   - abx dc'd on 4/6. Wbc stable. (-) fever. Will observe.    ENDOCRINE A:   Insulin dependent DM2   P:   - CBG Q4H - Lantus - SSI  NEUROLOGIC A:   New onset seizures, unclear etiology Status epilepticus MRI/EEG with anoxic injury  2/2 status epilepticus.  P:  - Neurology consult appreciated - AED's per neurology - Propofol gtt being held - RASS goal: -1  No family at bedside. Spoke to family re: over all poor prognosis on 4/6. Family will decide re: code status.  My critical care time with this pt today : 35 minutes.    Monica Becton, MD 09/15/2015, 8:09 AM [place] [facility] [facility] Pager [phone] After 3 pm or if no answer, call [phone]

## 2015-09-15 NOTE — Progress Notes (Signed)
[facility] Progress Note   Subjective: on vent, no change  Filed Vitals:   09/15/15 1030 09/15/15 1045 09/15/15 1100 09/15/15 1115  BP: 132/69 122/63 144/71 114/87  Pulse: 95 93 93 92  Temp:      TempSrc:      Resp: '28 25 26 26  ' Height:      Weight:      SpO2: 99% 100% 100% 100%    Inpatient medications: . antiseptic oral rinse  7 mL Mouth Rinse QID  . budesonide (PULMICORT) nebulizer solution  0.5 mg Nebulization BID  . chlorhexidine gluconate (SAGE KIT)  15 mL Mouth Rinse BID  . collagenase   Topical Daily  . famotidine  20 mg Per Tube Daily  . feeding supplement (NEPRO CARB STEADY)  1,000 mL Per Tube Q24H  . feeding supplement (PRO-STAT SUGAR FREE 64)  30 mL Per Tube QID  . heparin subcutaneous  5,000 Units Subcutaneous 3 times per day  . insulin aspart  0-15 Units Subcutaneous 6 times per day  . ipratropium-albuterol  3 mL Nebulization QID  . lacosamide (VIMPAT) IV  200 mg Intravenous Q12H  . levETIRAcetam  1,000 mg Intravenous Q24H  . sodium chloride  250 mL Intravenous Once  . topiramate  100 mg Oral Q12H  . valproate sodium  400 mg Intravenous 4 times per day   . propofol (DIPRIVAN) infusion Stopped (09/14/15 0900)   sodium chloride, sodium chloride, sodium chloride, sodium chloride, acetaminophen (TYLENOL) oral liquid 160 mg/5 mL, alteplase, fentaNYL (SUBLIMAZE) injection, heparin, [START ON 09/16/2015] heparin, lacosamide (VIMPAT) IV, levETIRAcetam, lidocaine (PF), lidocaine-prilocaine, LORazepam, metoprolol, pentafluoroprop-tetrafluoroeth, topiramate  Exam: On vent, eyes half-open, not following commands or tracking Sclera anicteric, throat w ETT in place  No jvd or bruits Chest clear bilat ant/ lat RRR no MRG Abd soft ntnd no mass or ascites +bs GU normal male, foley in place MS L BKA/ R 4+5 toe amps Ext no LE edema / no wounds or ulcers Neuro, on vent R IJ TDC, left UE AVF no bruit  CXR negative CT chest RUL lobulated mass, o/w  clear  Dialysis: TTS [facility] 4h 2/2 bath R IJ cath 73kg Hep 7500 Calcitriol 1.25 ug tiw   Assessment: 1. AMS/ seizures/ status epilepticus - seizuers resolved, now w TME per EEG.  On vimpat/ Keppra/ depakote IV and po topiramate 2. Fever/ ^wbc - resolving, abx stopped on 4/6 3. Lung mass RUL 4. Renal mass by CT 5. Liver lesion by CT- large hemangioma most likely 6. ESRD HD TTS 7. Volume - no vol excess on exam, just over dry wt and BP's good 8. DM on insulin 9. MBD of CKD get records 10. Anemia of CKD - Hb 11, no esa needed, follow  Plan - HD Saturday, no UF   Kelly Splinter MD [place] pager [phone]    cell [phone] 09/15/2015, 11:31 AM    Recent Labs Lab 09/13/15 0450 09/14/15 0532 09/15/15 0242  NA 142 133* 135  K 4.2 3.7 3.0*  CL 99* 93* 95*  CO2 '24 24 23  ' GLUCOSE 112* 70 104*  BUN 57* 42* 67*  CREATININE 6.55* 5.31* 7.20*  CALCIUM 8.2* 7.7* 7.9*  PHOS 7.0* 5.2* 6.1*    Recent Labs Lab 09/27/2015 2009  09/12/15 1029 09/13/15 0450 09/14/15 0532  AST 25  --  17  --   --   ALT 12*  --  10*  --   --   ALKPHOS 104  --  90  --   --   BILITOT 0.6  --  0.9  --   --   PROT 7.6  --  7.0  --   --   ALBUMIN 3.9  < > 3.6 3.2* 2.9*  < > = values in this interval not displayed.  Recent Labs Lab 09/25/2015 2009 09/12/15 0438 09/13/15 0450 09/14/15 0500 09/15/15 0242  WBC 11.9* 19.0* 15.3* 14.7* 10.6*  NEUTROABS 9.1* 16.2*  --   --   --   HGB 12.8* 12.6* 11.7* 10.8* 10.4*  HCT 42.3 41.4 38.9* 34.5* 34.8*  MCV 92.8 92.4 92.4 93.5 90.6  PLT 137* 135* 123* 98* 105*

## 2015-09-16 DIAGNOSIS — N184 Chronic kidney disease, stage 4 (severe): Secondary | ICD-10-CM

## 2015-09-16 DIAGNOSIS — G40909 Epilepsy, unspecified, not intractable, without status epilepticus: Secondary | ICD-10-CM | POA: Insufficient documentation

## 2015-09-16 DIAGNOSIS — Z515 Encounter for palliative care: Secondary | ICD-10-CM

## 2015-09-16 DIAGNOSIS — R569 Unspecified convulsions: Secondary | ICD-10-CM | POA: Insufficient documentation

## 2015-09-16 LAB — COMPREHENSIVE METABOLIC PANEL
ALBUMIN: 2.8 g/dL — AB (ref 3.5–5.0)
ALT: 20 U/L (ref 17–63)
AST: 48 U/L — AB (ref 15–41)
Alkaline Phosphatase: 68 U/L (ref 38–126)
Anion gap: 13 (ref 5–15)
BILIRUBIN TOTAL: 0.6 mg/dL (ref 0.3–1.2)
BUN: 39 mg/dL — AB (ref 6–20)
CHLORIDE: 102 mmol/L (ref 101–111)
CO2: 23 mmol/L (ref 22–32)
CREATININE: 4.95 mg/dL — AB (ref 0.61–1.24)
Calcium: 8.2 mg/dL — ABNORMAL LOW (ref 8.9–10.3)
GFR calc Af Amer: 13 mL/min — ABNORMAL LOW (ref >60)
GFR, EST NON AFRICAN AMERICAN: 12 mL/min — AB (ref >60)
GLUCOSE: 89 mg/dL (ref 65–99)
Potassium: 3.4 mmol/L — ABNORMAL LOW (ref 3.5–5.1)
Sodium: 138 mmol/L (ref 135–145)
TOTAL PROTEIN: 6.2 g/dL — AB (ref 6.5–8.1)

## 2015-09-16 LAB — CULTURE, BLOOD (ROUTINE X 2)
CULTURE: NO GROWTH
Culture: NO GROWTH

## 2015-09-16 LAB — GLUCOSE, CAPILLARY
GLUCOSE-CAPILLARY: 80 mg/dL (ref 65–99)
GLUCOSE-CAPILLARY: 94 mg/dL (ref 65–99)
GLUCOSE-CAPILLARY: 105 mg/dL — AB (ref 65–99)
GLUCOSE-CAPILLARY: 142 mg/dL — AB (ref 65–99)
Glucose-Capillary: 136 mg/dL — ABNORMAL HIGH (ref 65–99)
Glucose-Capillary: 182 mg/dL — ABNORMAL HIGH (ref 65–99)

## 2015-09-16 LAB — PHOSPHORUS: Phosphorus: 3.4 mg/dL (ref 2.5–4.6)

## 2015-09-16 LAB — CBC
HEMATOCRIT: 35.4 % — AB (ref 39.0–52.0)
Hemoglobin: 10.7 g/dL — ABNORMAL LOW (ref 13.0–17.0)
MCH: 27.9 pg (ref 26.0–34.0)
MCHC: 30.2 g/dL (ref 30.0–36.0)
MCV: 92.4 fL (ref 78.0–100.0)
Platelets: 105 10*3/uL — ABNORMAL LOW (ref 150–400)
RBC: 3.83 MIL/uL — AB (ref 4.22–5.81)
RDW: 17.1 % — ABNORMAL HIGH (ref 11.5–15.5)
WBC: 11 10*3/uL — AB (ref 4.0–10.5)

## 2015-09-16 LAB — PROTIME-INR
INR: 1.18 (ref 0.00–1.49)
Prothrombin Time: 15.2 seconds (ref 11.6–15.2)

## 2015-09-16 LAB — MAGNESIUM: Magnesium: 2.1 mg/dL (ref 1.7–2.4)

## 2015-09-16 MED ORDER — MIDAZOLAM HCL 2 MG/2ML IJ SOLN: 2.0000 mg | INTRAMUSCULAR | Status: DC | PRN

## 2015-09-16 MED ORDER — MIDAZOLAM HCL 2 MG/2ML IJ SOLN
INTRAMUSCULAR | Status: AC
  Administered 2015-09-16: 2 mg
  Filled 2015-09-16: qty 2

## 2015-09-16 MED ORDER — POTASSIUM CHLORIDE 10 MEQ/100ML IV SOLN
10.0000 meq | INTRAVENOUS | Status: AC
  Administered 2015-09-16 (×2): 10 meq via INTRAVENOUS
  Filled 2015-09-16 (×2): qty 100

## 2015-09-16 NOTE — Progress Notes (Signed)
Interval History:                                                                                                                      John [name] is an 60 y.o. male patient with acute encephalopathy, status epilepticus earlier during this admission, now resolved, continues to have evidence of encephalopathy on the EEG. He is intubated, spontaneous trial. Has significant metabolic abnormality suggesting stage renal failure. His cultures have all been negative so far. The etiology for seizures is still unknown, possible metabolic abnormalities contributing to it. MRI of the brain on 09/13/2015 showed evidence of restricted diffusion bilateral medial temporal lobe involving hippocampi which appears to be secondary to status epilepticus. Still on continuous EEG monitoring. Occasional spike and slow-wave discharges were seen but no seizures. Continues to show generalized background slowing suggestive of encephalopathy.    Past Medical History: Past Medical History  Diagnosis Date  . COPD (chronic obstructive pulmonary disease) ([place])   . Chronic back pain     Chronic narcotics for this for many years  . Hyperlipidemia   . Foot pain   . Peripheral vascular disease ([place])   . Asthma   . Ulcer 1985  . Fracture of foot     [place]- Right  . Neuromuscular disorder ([place])     periferal neuropathy  . Chronic kidney disease     CKD stage III  . Hypertension     not taking any medication  . Stroke ([place])     No residual "Mini strokes"  . Diabetes mellitus     Type II  . Arthritis     Past Surgical History  Procedure Laterality Date  . Neck surgery      "Titatium Plate"  . Eye surgery    . Cardiac catheterization    . Endarterectomy  12/18/2011    Procedure: ENDARTERECTOMY CAROTID;  Surgeon: Angelia Mould, MD;  Location: [place];  Service: Vascular;  Laterality: Left;  . Carotid endarterectomy Right   . Axillary-femoral bypass graft  06/18/2012    Procedure: BYPASS GRAFT  AXILLA-BIFEMORAL;  Surgeon: Angelia Mould, MD;  Location: [place];  Service: Vascular;  Laterality: Right;  Right Axillo to Right Femoral Bypass Graft  . Femoral-popliteal bypass graft  06/18/2012    Procedure: BYPASS GRAFT FEMORAL-POPLITEAL ARTERY;  Surgeon: Angelia Mould, MD;  Location: [facility] OR;  Service: Vascular;  Laterality: Right;  Right Femoral to Above Knee Popliteal Bypass Graft  . Amputation  06/18/2012    Procedure: AMPUTATION DIGIT;  Surgeon: Angelia Mould, MD;  Location: [facility] OR;  Service: Vascular;  Laterality: Right;  Right side, amputation of fourth and fifth toes  . Av fistula placement  06/24/2012    Procedure: ARTERIOVENOUS (AV) FISTULA CREATION;  Surgeon: Angelia Mould, MD;  Location: [place];  Service: Vascular;  Laterality: Left;  . Lower extremity angiogram Right 11/21/2011    Procedure: LOWER EXTREMITY ANGIOGRAM;  Surgeon: Elam Dutch, MD;  Location: [facility] CATH LAB;  Service: Cardiovascular;  Laterality: Right;  . Enucleation Left     trauma as a child. Wears prosthesis.   Marland Kitchen Below knee leg amputation Left     [facility] - Dr. Sabino Dick  . Amputation toe Left 2016    2 toes  . Transmetatarsal amputation Left 2016  . Ligation of arteriovenous  fistula Left 08/27/2015    Procedure: LIGATION OF Left Arm ARTERIOVENOUS  FISTULA;  Surgeon: Rosetta Posner, MD;  Location: [place];  Service: Vascular;  Laterality: Left;  . Insertion of dialysis catheter Left 08/27/2015    Procedure: INSERTION OF Right Internal Jugular DIALYSIS CATHETER;  Surgeon: Rosetta Posner, MD;  Location: [place];  Service: Vascular;  Laterality: Left;  . Back surgery      Family History: Family History  Problem Relation Age of Onset  . Diabetes Mother   . Heart disease Mother     before age 75  . Hypertension Mother   . Heart attack Mother   . Diabetes Father     Amputation  . Heart disease Father     Before age 54  . Hypertension Father   . Heart attack Father   . Other Father      bleeding problems  . Diabetes Brother   . Heart disease Brother     Before age 11  . Hypertension Brother   . Heart attack Brother     Social History:   reports that he has been smoking Cigarettes.  He has a 8 pack-year smoking history. He has never used smokeless tobacco. He reports that he does not drink alcohol or use illicit drugs.  Allergies:  Allergies  Allergen Reactions  . Nicoderm [Nicotine] Itching and Rash  . Gabapentin Other (See Comments)    Can't walk, shuts legs down  . Vicodin [Hydrocodone-Acetaminophen] Hives and Itching     Medications:                                                                                                                         Current facility-administered medications:  .  0.9 %  sodium chloride infusion, 100 mL, Intravenous, PRN, Roney Jaffe, MD, Stopped at 09/13/15 2130 .  0.9 %  sodium chloride infusion, 100 mL, Intravenous, PRN, Roney Jaffe, MD .  0.9 %  sodium chloride infusion, 100 mL, Intravenous, PRN, Roney Jaffe, MD .  0.9 %  sodium chloride infusion, 100 mL, Intravenous, PRN, Roney Jaffe, MD .  0.9 %  sodium chloride infusion, , Intravenous, Continuous, Jose Angelo A de Larkin Ina, MD, Last Rate: 10 mL/hr at 09/15/15 1600 .  acetaminophen (TYLENOL) solution 650 mg, 650 mg, Per Tube, Q6H PRN, Raylene Miyamoto, MD, 650 mg at 09/16/15 [phone] .  alteplase (CATHFLO ACTIVASE) injection 2 mg, 2 mg, Intracatheter, Once PRN, Roney Jaffe, MD .  antiseptic oral rinse solution (CORINZ), 7 mL, Mouth Rinse, QID, Juanito Doom, MD, 7 mL at 09/16/15 0400 .  budesonide (PULMICORT) nebulizer solution  0.5 mg, 0.5 mg, Nebulization, BID, Jose Angelo A Funston, MD, 0.5 mg at 09/16/15 0719 .  chlorhexidine gluconate (SAGE KIT) (PERIDEX) 0.12 % solution 15 mL, 15 mL, Mouth Rinse, BID, Juanito Doom, MD, 15 mL at 09/16/15 0800 .  collagenase (SANTYL) ointment, , Topical, Daily, Todd D McDiarmid, MD .  famotidine (PEPCID) tablet 20  mg, 20 mg, Per Tube, Daily, Priscella Mann, RPH, 20 mg at 09/16/15 0946 .  feeding supplement (NEPRO CARB STEADY) liquid 1,000 mL, 1,000 mL, Per Tube, Q24H, Jose Shirl Harris, MD, Last Rate: 35 mL/hr at 09/16/15 0242, 1,000 mL at 09/16/15 0242 .  feeding supplement (PRO-STAT SUGAR FREE 64) liquid 30 mL, 30 mL, Per Tube, QID, Jose Shirl Harris, MD, 30 mL at 09/16/15 0946 .  fentaNYL (SUBLIMAZE) injection 100 mcg, 100 mcg, Intravenous, Q2H PRN, Juanito Doom, MD, 100 mcg at 09/16/15 7517 .  heparin injection 1,000 Units, 1,000 Units, Dialysis, PRN, Roney Jaffe, MD .  heparin injection 3,500 Units, 3,500 Units, Dialysis, PRN, Roney Jaffe, MD, 3,500 Units at 09/15/15 1032 .  heparin injection 5,000 Units, 5,000 Units, Subcutaneous, 3 times per day, Maryellen Pile, MD, 5,000 Units at 09/16/15 0600 .  insulin aspart (novoLOG) injection 0-15 Units, 0-15 Units, Subcutaneous, 6 times per day, Maryellen Pile, MD, 3 Units at 09/16/15 0000 .  ipratropium-albuterol (DUONEB) 0.5-2.5 (3) MG/3ML nebulizer solution 3 mL, 3 mL, Nebulization, QID, Jose Angelo A de Larkin Ina, MD, 3 mL at 09/16/15 1159 .  lacosamide (VIMPAT) 100 mg in sodium chloride 0.9 % 25 mL IVPB, 100 mg, Intravenous, Q T,Th,Sa-HD, Blane Ohara McDiarmid, MD, 100 mg at 09/15/15 1800 .  lacosamide (VIMPAT) 200 mg in sodium chloride 0.9 % 25 mL IVPB, 200 mg, Intravenous, Q12H, Damarion Mendizabal Fuller Mandril, MD, 200 mg at 09/16/15 0948 .  levETIRAcetam (KEPPRA) 1,000 mg in sodium chloride 0.9 % 100 mL IVPB, 1,000 mg, Intravenous, Q24H, Eulice Rutledge Fuller Mandril, MD, 1,000 mg at 09/15/15 1523 .  levETIRAcetam (KEPPRA) 500 mg in sodium chloride 0.9 % 100 mL IVPB, 500 mg, Intravenous, Q T,Th,Sa-HD, Blane Ohara McDiarmid, MD, 500 mg at 09/15/15 1800 .  lidocaine (PF) (XYLOCAINE) 1 % injection 5 mL, 5 mL, Intradermal, PRN, Roney Jaffe, MD .  lidocaine-prilocaine (EMLA) cream 1 application, 1 application, Topical, PRN, Roney Jaffe, MD .  LORazepam  (ATIVAN) injection 2 mg, 2 mg, Intravenous, Q15 min PRN, Juanito Doom, MD, 2 mg at 09/14/15 0405 .  metoprolol (LOPRESSOR) injection 2.5-5 mg, 2.5-5 mg, Intravenous, Q3H PRN, Juanito Doom, MD, 5 mg at 10/06/2015 2310 .  midazolam (VERSED) injection 2 mg, 2 mg, Intravenous, Q2H PRN, Jose Angelo A Corrie Dandy, MD .  pentafluoroprop-tetrafluoroeth (GEBAUERS) aerosol 1 application, 1 application, Topical, PRN, Roney Jaffe, MD .  propofol (DIPRIVAN) 1000 MG/100ML infusion, 0-50 mcg/kg/min, Intravenous, Continuous, Maryellen Pile, MD, Stopped at 09/14/15 0900 .  sodium chloride 0.9 % bolus 250 mL, 250 mL, Intravenous, Once, Corliss Parish, MD, 250 mL at 09/13/15 1945 .  topiramate (TOPAMAX) NICU ORAL suspension 6 mg/mL, 100 mg, Oral, Q12H, Mael Delap Fuller Mandril, MD, 100 mg at 09/16/15 0600 .  topiramate (TOPAMAX) NICU ORAL suspension 6 mg/mL, 100 mg, Oral, Q dialysis, Shellie Goettl Fuller Mandril, MD, 100 mg at 09/13/15 1742 .  valproate (DEPACON) 400 mg in dextrose 5 % 50 mL IVPB, 400 mg, Intravenous, 4 times per day, Dawson Hollman Fuller Mandril, MD, 400 mg at 09/16/15 0017  Facility-Administered Medications Ordered in Other  Encounters:  .  ferumoxytol (FERAHEME) 1,020 mg in sodium chloride 0.9 % 100 mL IVPB, 1,020 mg, Intravenous, Once, Corliss Parish, MD   Neurologic Examination:                                                                                                     Today's Vitals   09/16/15 1000 09/16/15 1100 09/16/15 1159 09/16/15 1227  BP: 97/59 104/48 101/59   Pulse: 88 86 103   Temp:    99.8 F (37.7 C)  TempSrc:    Oral  Resp: 23 17 32   Height:      Weight:      SpO2: 90% 100% 98%   PainSc:       intubated, moving his b/l UE, and right LE intermittently, and withdraws to stimulation , s/p left BKA.  Does not follow commands.    Lab Results: Basic Metabolic Panel:  Recent Labs Lab 09/09/2015 2009 09/12/15 0438 09/13/15 0450 09/14/15 0532  09/15/15 0242 09/16/15 0500  NA 140 144 142 133* 135 138  K 4.4 4.9 4.2 3.7 3.0* 3.4*  CL 99* 102 99* 93* 95* 102  CO2 _0 GLUCOSE 129* 88 112* 70 104* 89  BUN 55* 63* 57* 42* 67* 39*  CREATININE 7.40* 7.70* 6.55* 5.31* 7.20* 4.95*  CALCIUM 9.0 8.6* 8.2* 7.7* 7.9* 8.2*  MG 2.2  --   --   --  2.1 2.1  PHOS  --  6.9* 7.0* 5.2* 6.1* 3.4    Liver Function Tests:  Recent Labs Lab 09/27/2015 2009 09/12/15 0438 09/12/15 1029 09/13/15 0450 09/14/15 0532 09/16/15 0500  AST 25  --  17  --   --  48*  ALT 12*  --  10*  --   --  20  ALKPHOS 104  --  90  --   --  68  BILITOT 0.6  --  0.9  --   --  0.6  PROT 7.6  --  7.0  --   --  6.2*  ALBUMIN 3.9 3.7 3.6 3.2* 2.9* 2.8*   No results for input(s): LIPASE, AMYLASE in the last 168 hours.  Recent Labs Lab 09/30/2015 1952  AMMONIA 41*    CBC:  Recent Labs Lab 09/12/2015 2009 09/12/15 0438 09/13/15 0450 09/14/15 0500 09/15/15 0242 09/16/15 0500  WBC 11.9* 19.0* 15.3* 14.7* 10.6* 11.0*  NEUTROABS 9.1* 16.2*  --   --   --   --   HGB 12.8* 12.6* 11.7* 10.8* 10.4* 10.7*  HCT 42.3 41.4 38.9* 34.5* 34.8* 35.4*  MCV 92.8 92.4 92.4 93.5 90.6 92.4  PLT 137* 135* 123* 98* 105* 105*    Cardiac Enzymes:  Recent Labs Lab 09/14/2015 1943  TROPONINI 0.15*    Lipid Panel:  Recent Labs Lab 09/12/15 09/14/15 2257  TRIG 148 127    CBG:  Recent Labs Lab 09/15/15 1607 09/15/15 1933 09/15/15 2348 09/16/15 0503 09/16/15 0750  GLUCAP 123* 110* 182* 20 136*    Microbiology: Results for orders placed or performed during the hospital encounter  of 09/18/2015  Culture, blood (Routine X 2) w Reflex to ID Panel     Status: None (Preliminary result)   Collection Time: 09/29/2015  7:52 PM  Result Value Ref Range Status   Specimen Description BLOOD BLOOD RIGHT FOREARM  Final   Special Requests IN PEDIATRIC BOTTLE 3CC  Final   Culture NO GROWTH 4 DAYS  Final   Report Status PENDING  Incomplete  Culture, blood (Routine X 2)  w Reflex to ID Panel     Status: None (Preliminary result)   Collection Time: 09/09/2015  7:57 PM  Result Value Ref Range Status   Specimen Description BLOOD RIGHT HAND  Final   Special Requests BOTTLES DRAWN AEROBIC ONLY [place]  Final   Culture NO GROWTH 4 DAYS  Final   Report Status PENDING  Incomplete  MRSA PCR Screening     Status: None   Collection Time: 09/12/15  2:00 AM  Result Value Ref Range Status   MRSA by PCR NEGATIVE NEGATIVE Final    Comment:        The GeneXpert MRSA Assay (FDA approved for NASAL specimens only), is one component of a comprehensive MRSA colonization surveillance program. It is not intended to diagnose MRSA infection nor to guide or monitor treatment for MRSA infections.   Culture, respiratory (NON-Expectorated)     Status: None   Collection Time: 09/12/15 10:03 AM  Result Value Ref Range Status   Specimen Description TRACHEAL ASPIRATE  Final   Special Requests NONE  Final   Gram Stain   Final    RARE WBC PRESENT, PREDOMINANTLY PMN RARE SQUAMOUS EPITHELIAL CELLS PRESENT NO ORGANISMS SEEN Performed at [facility]    Culture   Final    NORMAL OROPHARYNGEAL FLORA Performed at [facility]    Report Status 09/15/2015 FINAL  Final  Culture, Urine     Status: None   Collection Time: 09/12/15 10:57 AM  Result Value Ref Range Status   Specimen Description URINE, CATHETERIZED  Final   Special Requests NONE  Final   Culture NO GROWTH 1 DAY  Final   Report Status 09/13/2015 FINAL  Final  C difficile quick scan w PCR reflex     Status: Abnormal   Collection Time: 09/14/15  1:11 AM  Result Value Ref Range Status   C Diff antigen POSITIVE (A) NEGATIVE Final   C Diff toxin NEGATIVE NEGATIVE Final   C Diff interpretation   Final    C. difficile present, but toxin not detected. This indicates colonization. In most cases, this does not require treatment. If patient has signs and symptoms consistent with colitis, consider treatment. Requires  ENTERIC precautions.    Imaging: Mr Herby Abraham Contrast  09/14/2015  ADDENDUM REPORT: 09/14/2015 00:44 ADDENDUM: Subacute RIGHT posterior temporal lobe subcentimeter infarct. Electronically Signed   By: Elon Alas M.D.   On: 09/14/2015 00:44  09/14/2015  CLINICAL DATA:  Status epilepticus, postictal encephalopathy. History of posterior fossa stroke and LEFT vertebral artery occlusion. History of seizures, end-stage renal disease on dialysis, hypertension, hyperlipidemia, diabetes, stroke. EXAM: MRI HEAD WITHOUT CONTRAST TECHNIQUE: Multiplanar, multiecho pulse sequences of the brain and surrounding structures were obtained without intravenous contrast. COMPARISON:  CT head September 11, 2015 FINDINGS: The ventricles and sulci are normal for patient's age. No mass lesions, mass effect. 1 cm reduced diffusion RIGHT posterior temporal lobe, normalized ADC values and bright FLAIR T2 hyperintense signal. Symmetric T2 bright FLAIR signal within the hippocampi bilaterally, corresponding reduced diffusion. No  susceptibility artifact to suggest hemorrhage. Patchy T2 bright FLAIR signal within the bilateral cerebellum, in the periphery of the temporal lobes, periphery of the frontal parietal lobes. Old LEFT thalamus lacunar infarct. Old LEFT basal ganglia lacunar infarcts. Old LEFT anterior limb of the internal capsule infarct. Old tiny bilateral cerebellar infarcts. No abnormal extra-axial fluid collections. No extra-axial masses though, contrast enhanced sequences would be more sensitive. Normal major intracranial vascular flow voids seen at the skull base. Status post LEFT ocular globe prosthesis. No abnormal sellar expansion. No suspicious calvarial bone marrow signal. Craniocervical junction maintained. Status post upper cervical ACDF, incompletely evaluated. Trace paranasal sinus mucosal thickening and mastoid effusions, life-support lines in place. IMPRESSION: Symmetric abnormal hippocampi signal compatible status  epilepticus, and postictal state. 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 Old LEFT thalamus and LEFT basal ganglia lacunar infarcts. Old RIGHT internal capsule infarct. Old tiny cerebellar infarcts. These results will be called to the ordering clinician or representative by the Radiologist Assistant, and communication documented in the PACS or zVision Dashboard. Electronically Signed: By: Elon Alas M.D. On: 09/13/2015 23:54   Ct Abdomen Pelvis W Contrast  09/13/2015  CLINICAL DATA:  Liver lesions seen by chest CT. Previous documentation of large hepatic hemangioma. EXAM: CT ABDOMEN AND PELVIS WITH CONTRAST TECHNIQUE: Multidetector CT imaging of the abdomen and pelvis was performed using the standard protocol following bolus administration of intravenous contrast. CONTRAST:  100 mL ISOVUE-300 IOPAMIDOL (ISOVUE-300) INJECTION 61% COMPARISON:  CT of the abdomen and pelvis on 10/19/2005 FINDINGS: Lower chest:  Atelectasis at the posterior left lung base. Hepatobiliary: The large lesion occupying much of the right lobe of the liver shows globular peripheral enhancement with further increased central enhancement on delayed imaging. This is consistent with a large hemangioma and causes superior capsular bulging into the right hemidiaphragm. Maximal dimensions are approximately 14.5 cm. This appears to have enlarged since prior CT in 2007 at which time maximum diameter was approximately 12 cm. No evidence to suggest active bleeding. The rest of the liver appears unremarkable. The gallbladder may contain a punctate calculus. No biliary ductal dilatation. Pancreas: No mass, inflammatory changes, or other significant abnormality. Spleen: Within normal limits in size and appearance. Adrenals/Urinary Tract: There is a solid-appearing mass at the anterior right lower kidney measuring approximately 1.7 x  2.1 cm on the delayed phase. This mass is largely endophytic and has a minimal exophytic component. Density measurement on the venous phase is consistent with solid tissue or hemorrhagic content. Further evaluation with MRI of would be warranted as this is concerning for renal carcinoma. Additional lower pole cyst just inferior to the solid lesion appears benign and measures approximately 1.3 cm. There is a benign appearing cyst of the superior left kidney measuring 2.4 cm. No hydronephrosis. The bladder is decompressed by a Foley catheter. Stomach/Bowel: No evidence of obstruction, inflammatory process, or abnormal fluid collections. Rectal tube present. No evidence of free air or abscess. Vascular/Lymphatic: No pathologically enlarged lymph nodes. No evidence of abdominal aortic aneurysm. Reproductive: No mass or other significant abnormality. Other: Right-sided subcutaneous axillo-femoral bypass graft is identified with open anastomosis to the right femoral artery. There also appears to be extension of a femoral bypass graft that extends below the inguinal ligament. Musculoskeletal:  Bony structures are unremarkable. IMPRESSION: 1. The dominant right lobe hepatic lesion is consistent with hemangioma and was previously demonstrated to represent hemangioma in 2007. Dimensions appear to have  increased with maximum diameter now of 14.5 cm compared to approximately 12 cm previously. There is no evidence by CT of hemorrhagic complication related to the giant hemangioma. 2. Incidental detection of a solid-appearing right renal mass measuring approximately 2.1 cm and emanating from the lower pole anteriorly. This is concerning for renal carcinoma. Further evaluation with MRI may be helpful to characterize the lesion and its enhancement pattern. 3. Potential tiny gallstone in the gallbladder. 4. Patent right-sided axillo femoral bypass graft. Electronically Signed   By: Aletta Edouard M.D.   On: 09/13/2015 23:55   Dg  Chest Port 1 View  09/15/2015  CLINICAL DATA:  60 year old male with a history of COPD hypertension and diabetes. Admission 09/11/2002 with acute encephalopathy in seizures. History of right ax fem bypass EXAM: PORTABLE CHEST 1 VIEW COMPARISON:  Multiple prior chest x-ray most recent 09/14/2015. CT chest 09/10/2015 FINDINGS: Cardiomediastinal silhouette unchanged in size and contour. No evidence of central vascular congestion. Coarsened interstitial markings again noted. Similar appearance of soft tissue nodule right chest, better characterized on prior CT. Unchanged position of right IJ approach hemodialysis catheter, terminating in these upper right atrium. Unchanged position of left IJ approach central venous catheter appearing to terminate superior vena cava. Unchanged position of endotracheal tube, terminating suitably above the carina measuring approximately 4 cm. Unchanged gastric tube. Surgical changes of the cervical region. Calcifications of the carotid vasculature. IMPRESSION: Similar appearance of the chest x-ray to the prior with unchanged support apparatus, as above. Unchanged aeration of the lungs, without new airspace disease. No pneumothorax. Right lung nodule better characterized on prior CT. Signed, Dulcy Fanny. Earleen Newport, DO [facility] Electronically Signed   By: Corrie Mckusick D.O.   On: 09/15/2015 07:49   Dg Chest Port 1 View  09/14/2015  CLINICAL DATA:  Intubation. EXAM: PORTABLE CHEST 1 VIEW COMPARISON:  09/13/2015. FINDINGS: Endotracheal tube, NG tube, bilateral IJ lines in unchanged position. Right IJ line dual-lumen catheter tip is in the right atrium. Heart size normal. Unchanged right upper lung nodular density. Lung volumes with bibasilar atelectasis. No pleural effusion pneumothorax . IMPRESSION: 1. Lines and tubes in stable position. 2.  Lung volumes with stable bibasilar atelectasis. 3.  Unchanged right upper lobe nodular density.  Electronically Signed   By: Marcello Moores  Register   On: 09/14/2015 07:10   Dg Chest Port 1 View  09/13/2015  CLINICAL DATA:  Respiratory failure. EXAM: PORTABLE CHEST 1 VIEW COMPARISON:  09/12/2015. FINDINGS: Endotracheal tube, NG tube, dual-lumen right IJ, left IJ catheter in stable position. Heart size normal. Low lung volumes with mild basilar atelectasis. No pleural effusion or pneumothorax. IMPRESSION: 1. Lines and tubes in stable position. 2. Low lung volumes with mild bibasilar atelectasis and/or infiltrate. 3. Persistent right upper lobe nodular lesion, unchanged. Electronically Signed   By: Marcello Moores  Register   On: 09/13/2015 07:21    Assessment and plan:   John [name] is an 60 y.o. male patient with acute encephalopathy, status epilepticus earlier during this admission, now resolved with seizure medications which include Depacon 400 mg every 6 hours, Keppra 1 g every 24 hours with additional 500 mg post HD, Vimpat 200 mg twice a day with additional 100 mg post HD, Topamax 100 mg every 12 hours, with additional 100 mg post HD. He continues to have evidence of moderate encephalopathy on the EEG. He is intubated, tolerating spontaneous wean trial. Has significant metabolic abnormalities from end stage renal failure. His blood cultures have  been negative. The etiology for seizures is still unknown, suspect possible metabolic abnormalities contributing to it. MRI of the brain on 09/13/2015 showed evidence of restricted diffusion in bilateral medial temporal lobes involving hippocampi which appears to be secondary to status epilepticus. On LTM continuous EEG monitoring. Brief review this morning showed infrequent occasional spike and slow-wave discharges,  but no seizures. Continues to show generalized background slowing suggestive of encephalopathy.  Attended family meeting with patient's wife, his sister and other family members , to discuss care goals, neurological assessment and prognosis. based on  his baseline significant medical comorbidities, with continued encephalopathy despite being off of this severity medications, the prognosis for meaningful neurological recovery appears guarded. He's been tolerating spontaneous wean trial on the ventilator, and it is possible that he will likely be able to be extubated in your future. The prognosis for cognitive recovery is likely to be poor given the prolonged encephalopathic state without significant improvement over time, and abnormal restricted diffusion changes seen on the brain MRI involving bilateral hippocampi.  Answered several of the family's questions to their satisfaction.   after the discussion with the palliative care team, they decided on a DO NOT RESUSCITATE status and no tracheostomy. They will decide about further care goals after discussing with the critical care team . Continue LTM EEG monitoring overnight and will discontinue tomorrow morning . No further change in his seizure medications at this time.   will follow up .       \

## 2015-09-16 NOTE — Progress Notes (Signed)
[facility] Progress Note   Subjective: on vent, no change  Chart review: 2006 - chest pain, abnormal stress test > heart cath w mild CAD < 30%, LV 50% 2007 - diarrhea 3 wks, also left arm weak for 2 wks > stool studies neg, MRI +subacute R CVA. W/U w comple R vert art occlusion and 50% L vertebral art occlusion. L ICA 60-80% sternosis, R ICA ok. Rx aggrenox. Dc asa/ plavix. HTN tobacco HL DM 2013 - fallw left foot pain hyperkalemia rx'd, CKD 3, DM/HTN 2013 - high grade L ICA stenosis > L CEA [place] 2013 - cellulitis R foot, diab neuropathy, CKD 3, DM, high K Jan 2014 - necrotic R foot wound > underwent R fem-pop bpg, 4/5 toe amps. CKD 4, AVG placed for future access.  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 High risk for limb loss per [facility] , pt requested Tx to [facility], dc'd to [facility] Feb 6-14, 2017 > for L BKA at [facility] and sent to CIR at [facility] for rehab stay. DC'd 07/24/15 Filed Vitals:   09/16/15 0800 09/16/15 0900 09/16/15 1000 09/16/15 1100  BP: 108/60 115/66 97/59 104/48  Pulse: 104 98 88 86  Temp:      TempSrc:      Resp: _0 Height:      Weight:      SpO2: 97% 100% 90% 100%    Inpatient medications: . antiseptic oral rinse  7 mL Mouth Rinse QID  . budesonide (PULMICORT) nebulizer solution  0.5 mg Nebulization BID  . chlorhexidine gluconate (SAGE KIT)  15 mL Mouth Rinse BID  . collagenase   Topical Daily  . famotidine  20 mg Per Tube Daily  . feeding supplement (NEPRO CARB STEADY)  1,000 mL Per Tube Q24H  . feeding supplement (PRO-STAT SUGAR FREE 64)  30 mL Per Tube QID  . heparin subcutaneous  5,000 Units Subcutaneous 3 times per day  . insulin aspart  0-15 Units Subcutaneous 6 times per day  . ipratropium-albuterol  3 mL Nebulization QID  . lacosamide (VIMPAT) IV   100 mg Intravenous Q T,Th,Sa-HD  . lacosamide (VIMPAT) IV  200 mg Intravenous Q12H  . levETIRAcetam  1,000 mg Intravenous Q24H  . levETIRAcetam  500 mg Intravenous Q T,Th,Sa-HD  . sodium chloride  250 mL Intravenous Once  . topiramate  100 mg Oral Q12H  . valproate sodium  400 mg Intravenous 4 times per day   . sodium chloride 10 mL/hr at 09/15/15 1600  . propofol (DIPRIVAN) infusion Stopped (09/14/15 0900)   sodium chloride, sodium chloride, sodium chloride, sodium chloride, acetaminophen (TYLENOL) oral liquid 160 mg/5 mL, alteplase, fentaNYL (SUBLIMAZE) injection, heparin, heparin, lidocaine (PF), lidocaine-prilocaine, LORazepam, metoprolol, midazolam, pentafluoroprop-tetrafluoroeth, topiramate  Exam: On vent, eyes half-open, not following commands or tracking Sclera anicteric, throat w ETT in place  No jvd or bruits Chest clear bilat ant/ lat RRR no MRG Abd soft ntnd no mass or ascites +bs GU normal male, foley in place MS L BKA/ R 4+5 toe amps Ext no LE edema / no wounds or ulcers Neuro, on vent R IJ TDC, left UE AVF no bruit  CXR negative CT chest RUL lobulated mass, o/w clear  Dialysis: TTS [facility] 4h  2/2 bath R IJ cath 73kg Hep 7500 Calcitriol 1.25 ug tiw   Assessment: 1. AMS/ seizures/ status epilepticus - prob due to uremia (only came to 12 of last 24 HD sessions as outpatient and only stayed on full Rx for half of those)  On vimpat/ Keppra/ depakote IV and po topiramate. Poor neuro status post seizures. Poor prognosis.  2. Fever/ ^wbc - resolving, abx stopped on 4/6 3. Lung mass RUL 4. Renal mass by CT 5. Liver lesion by CT- large hemangioma most likely 6. ESRD HD TTS 7. Volume - no vol excess on exam, at dry wt 8. DM on insulin 9. Anemia of CKD - Hb 10-12, no esa, follow 10. PVD severe w R toe amps/ L BKA 11. Nutrition - nepro TF's at 35/ hr  Plan - as above    Kelly Splinter MD [place] pager [phone]    cell [phone] 09/16/2015,  11:13 AM    Recent Labs Lab 09/14/15 0532 09/15/15 0242 09/16/15 0500  NA 133* 135 138  K 3.7 3.0* 3.4*  CL 93* 95* 102  CO2 _0 GLUCOSE 70 104* 89  BUN 42* 67* 39*  CREATININE 5.31* 7.20* 4.95*  CALCIUM 7.7* 7.9* 8.2*  PHOS 5.2* 6.1* 3.4    Recent Labs Lab 09/17/2015 2009  09/12/15 1029 09/13/15 0450 09/14/15 0532 09/16/15 0500  AST 25  --  17  --   --  48*  ALT 12*  --  10*  --   --  20  ALKPHOS 104  --  90  --   --  68  BILITOT 0.6  --  0.9  --   --  0.6  PROT 7.6  --  7.0  --   --  6.2*  ALBUMIN 3.9  < > 3.6 3.2* 2.9* 2.8*  < > = values in this interval not displayed.  Recent Labs Lab 10/07/2015 2009 09/12/15 0438  09/14/15 0500 09/15/15 0242 09/16/15 0500  WBC 11.9* 19.0*  < > 14.7* 10.6* 11.0*  NEUTROABS 9.1* 16.2*  --   --   --   --   HGB 12.8* 12.6*  < > 10.8* 10.4* 10.7*  HCT 42.3 41.4  < > 34.5* 34.8* 35.4*  MCV 92.8 92.4  < > 93.5 90.6 92.4  PLT 137* 135*  < > 98* 105* 105*  < > = values in this interval not displayed.

## 2015-09-16 NOTE — Consult Note (Signed)
Consultation Note Date: 09/16/2015   Patient Name: John [name]  DOB: 10/14/1955  MRN: [id number]  Age / Sex: 60 y.o., male  PCP: John La, MD Referring Physician: Blane Ohara McDiarmid, MD  Reason for Consultation: Establishing goals of care, Terminal Care and Withdrawal of life-sustaining treatment    Clinical Assessment/Narrative: Patient is a 60 year old man who was admitted for altered mental status. He was intubated for airway protection after being observed to have seizures. Patient has end-stage renal disease and is on hemodialysis 3 days a week hypertension diabetes COPD hyperlipidemia peripheral vascular disease stroke left BKA. His seizures have stopped but repeat EEG has not been encouraging. Patient is still showing slow brainwave function, and is clinically not improving. He will open his eyes spontaneously, is not tracking, he is moving his extremities more but these movements do not seem purposeful. He is having trouble controlling his own secretions. I met with multiple family members including his wife John [name] patient's sister John [name], wife's daughter, grandchildren. Neurologist also joined goals of care discussion and shared news that he was not hopeful for any meaningful recovery after sustaining bilateral temporal lobe damage after sustained seizure activity. Family all appears in agreement that patient would not want to live this way. He has been in a nursing home before and would not ever want to go again. Spouse was able to come to the conclusion of DO NOT RESUSCITATE, no trach and is leaning towards no PEG. She was requesting the meeting with critical care medicine and palliative medicine to finalize extubation, hemodialysis and PEG tube placement.  Contacts/Participants in Discussion: Primary Decision Maker: John [name]   Relationship to Patient wife HCPOA: yes    SUMMARY OF RECOMMENDATIONS DO NOT  RESUSCITATE No trach Leaning towards no PEG tube placement, but feel she needs a little more time and information from critical care medicine before reaching a final decision Leaning towards one way extubation because of profound neurological damage but again is requesting some time, and information Anticipate once family has reached the decisions on extubation and PEG tube placement will withdraw hemodialysis as well  Code Status/Advance Care Planning: DNR    Code Status Orders        Start     Ordered   09/16/15 1424  Do not attempt resuscitation (DNR)   Continuous    Question Answer Comment  In the event of cardiac or respiratory ARREST Do not call a "code blue"   In the event of cardiac or respiratory ARREST Do not perform Intubation, CPR, defibrillation or ACLS   In the event of cardiac or respiratory ARREST Use medication by any route, position, wound care, and other measures to relive pain and suffering. May use oxygen, suction and manual treatment of airway obstruction as needed for comfort.      09/16/15 1423    Code Status History    Date Active Date Inactive Code Status Order ID Comments User Context   07/16/2015  4:45 PM 07/24/2015  7:15 PM Full Code [id number]  Bary Leriche, PA-C Inpatient   01/20/2015 12:45 AM 01/20/2015  7:00 PM Full Code [id number]  Leone Brand, MD Inpatient   06/28/2012  6:49 PM 07/03/2012  5:10 PM Full Code [id number]  Birder Robson, RN ED   06/18/2012  3:26 PM 06/24/2012  8:34 PM Full Code [id number]  Irish Elders, RN Inpatient   12/18/2011  5:02 PM 12/21/2011  1:54 PM Full Code [id number]  Karen Kays, RN Inpatient  Other Directives:None  Symptom Management:   Pain: Deferred at this time to CCM. As he is in renal failure would recommend Dilaudid or fentanyl  Palliative Prophylaxis:   Delirium Protocol and Turn Reposition  Additional Recommendations (Limitations, Scope, Preferences):  No Chemotherapy and No  Tracheostomy   Psycho-social/Spiritual:  Support System: Strong Desire for further Chaplaincy support:no Additional Recommendations: Grief/Bereavement Support  Prognosis: < 4 weeks. Anticipate patient would have just weeks to live especially if he is unable to breathe on his own and if family declines PEG  Discharge Planning: To be determined   Chief Complaint/ Primary Diagnoses: Present on Admission:  . Acute encephalopathy . Diabetes mellitus with renal complications ([place]) . Peripheral vascular disease, unspecified ([place]) . PAD (peripheral artery disease) ([place]) . Leg ulcer ([place])  I have reviewed the medical record, interviewed the patient and family, and examined the patient. The following aspects are pertinent.  Past Medical History  Diagnosis Date  . COPD (chronic obstructive pulmonary disease) ([place])   . Chronic back pain     Chronic narcotics for this for many years  . Hyperlipidemia   . Foot pain   . Peripheral vascular disease ([place])   . Asthma   . Ulcer 1985  . Fracture of foot     [place]- Right  . Neuromuscular disorder ([place])     periferal neuropathy  . Chronic kidney disease     CKD stage III  . Hypertension     not taking any medication  . Stroke ([employer])     No residual "Mini strokes"  . Diabetes mellitus     Type II  . Arthritis    Social History   Social History  . Marital Status: Married    Spouse Name: John [name]  . Number of Children: 4  . Years of Education: 11   Occupational History  . 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 Smoking status: Current Some Day Smoker -- 0.20 packs/day for 40 years    Types: Cigarettes  . Smokeless tobacco: Never Used     Comment: 2-3 cigarettes a day  . Alcohol Use: No     Comment: Previous drinker - However, quit in 2006  . Drug Use: No  . Sexual Activity: No   Other Topics Concern  . Not on file   Social History Narrative   Health Care POA:    Emergency Contact: wife, John [name] [phone]    End of Life Plan: gave patient AD pamphlet.   Who lives with you: wife   Any pets: 1 dog   Diet: Pt has a varied diet of protein, starch and vegetables.   Exercise: Pt reports walking around yard.    Seatbelts: Pt reports wearing seatbelt when in vehicles.    Hobbies: fishing               Family History  Problem Relation Age of Onset  . Diabetes Mother   . Heart disease Mother     before age 32  . Hypertension Mother   . Heart attack Mother   . Diabetes Father     Amputation  . Heart disease Father     Before age 74  . Hypertension Father   . Heart attack Father   . Other Father     bleeding problems  . Diabetes Brother   . Heart disease Brother     Before age 69  . Hypertension Brother   . Heart attack Brother  Scheduled Meds: . antiseptic oral rinse  7 mL Mouth Rinse QID  . budesonide (PULMICORT) nebulizer solution  0.5 mg Nebulization BID  . chlorhexidine gluconate (SAGE KIT)  15 mL Mouth Rinse BID  . collagenase   Topical Daily  . famotidine  20 mg Per Tube Daily  . feeding supplement (NEPRO CARB STEADY)  1,000 mL Per Tube Q24H  . feeding supplement (PRO-STAT SUGAR FREE 64)  30 mL Per Tube QID  . heparin subcutaneous  5,000 Units Subcutaneous 3 times per day  . insulin aspart  0-15 Units Subcutaneous 6 times per day  . ipratropium-albuterol  3 mL Nebulization QID  . lacosamide (VIMPAT) IV  100 mg Intravenous Q T,Th,Sa-HD  . lacosamide (VIMPAT) IV  200 mg Intravenous Q12H  . levETIRAcetam  1,000 mg Intravenous Q24H  . levETIRAcetam  500 mg Intravenous Q T,Th,Sa-HD  . sodium chloride  250 mL Intravenous Once  . topiramate  100 mg Oral Q12H  . valproate sodium  400 mg Intravenous 4 times per day   Continuous Infusions: . sodium chloride 10 mL/hr at 09/15/15 1600  . propofol (DIPRIVAN) infusion Stopped (09/14/15 0900)   PRN Meds:.sodium chloride, sodium chloride, sodium chloride, sodium chloride, acetaminophen (TYLENOL) oral liquid 160 mg/5 mL, alteplase,  fentaNYL (SUBLIMAZE) injection, heparin, heparin, lidocaine (PF), lidocaine-prilocaine, LORazepam, metoprolol, midazolam, pentafluoroprop-tetrafluoroeth, topiramate Medications Prior to Admission:  Prior to Admission medications   Medication Sig Start Date End Date Taking? Authorizing Provider  aspirin 81 MG chewable tablet Chew 81 mg by mouth daily.     Historical Provider, MD  atorvastatin (LIPITOR) 40 MG tablet Take 40 mg by mouth every evening.    Historical Provider, MD  cephALEXin (KEFLEX) 500 MG capsule Take 1 capsule (500 mg total) by mouth daily. 09/05/15   Ankit Lorie Phenix, MD  cholecalciferol (VITAMIN D) 1000 UNITS tablet Take 1,000 Units by mouth daily.    Historical Provider, MD  collagenase (SANTYL) ointment Apply 1 application topically daily. Please apply to left popliteal fossa 08/08/15   Ankit Lorie Phenix, MD  collagenase (SANTYL) ointment Apply 1 application topically daily. 08/09/15   John La, MD  fentaNYL (DURAGESIC - DOSED MCG/HR) 12 MCG/HR Place 1 patch (12.5 mcg total) onto the skin every 3 (three) days. Change patch tomorrow 07/24/15   Ivan Anchors Love, PA-C  hydrocerin (EUCERIN) CREA Apply 1 application topically 2 (two) times daily. Patient taking differently: Apply 1 application topically daily.  07/24/15   Ivan Anchors Love, PA-C  insulin glargine (LANTUS) 100 UNIT/ML injection Inject 0.05 mLs (5 Units total) into the skin daily. Patient taking differently: Inject 5 Units into the skin daily.  07/24/15   Ivan Anchors Love, PA-C  insulin lispro (HUMALOG) 100 UNIT/ML injection Inject 2-3 Units into the skin 2 (two) times daily as needed for high blood sugar (CBG >180). Per sliding scale: inject 2 units subcutaneously for CBG 180-299, inject 3 units for CBG >300 08/13/10   Zenia Resides, MD  lanthanum (FOSRENOL) 1000 MG chewable tablet Chew 1,000 mg by mouth See admin instructions. Take 1 tablet (1000 mg) by mouth with large meals (once every 2-3 days)    Historical Provider, MD   lidocaine-EPINEPHrine-tetracaine (LET) GEL Apply topically to left arm 1 hour before dialysis 03/20/14   Vivi Barrack, MD  lidocaine-prilocaine (EMLA) cream Apply 1 application topically 3 (three) times a week. Reported on 08/23/2015    Historical Provider, MD  methocarbamol (ROBAXIN) 500 MG tablet Take 1 tablet (500 mg total) by  mouth every 6 (six) hours as needed for muscle spasms. 07/24/15   Bary Leriche, PA-C  multivitamin (RENA-VIT) TABS tablet Take 1 tablet by mouth daily.  04/12/14   Historical Provider, MD  oxyCODONE-acetaminophen (PERCOCET) 10-325 MG tablet Take one by mouth  Every 6 hours prn pain do not fill before September 14 2015 08/22/15   John La, MD  PROAIR HFA 108 [phone] Base) MCG/ACT inhaler INHALE 2 PUFFS BY MOUTH EVERY 4 HOURS AS NEEDED FOR WHEEZING OR SHORTNESS OF BREATH 08/10/15   John La, MD   Allergies  Allergen Reactions  . Nicoderm [Nicotine] Itching and Rash  . Gabapentin Other (See Comments)    Can't walk, shuts legs down  . Vicodin [Hydrocodone-Acetaminophen] Hives and Itching    Review of Systems  Unable to perform ROS: Acuity of condition    Physical Exam  Constitutional: He appears well-developed.  Acutely ill appearing man in ICU  Cardiovascular: Normal rate and regular rhythm.   Respiratory:  On ventilator. Breathing some on his on and beginning to wean  Genitourinary:  foley  Neurological:  Withdraws to touch, opens his eyes. No purposeful movment  Skin: Skin is warm.    Vital Signs: BP 98/44 mmHg  Pulse 100  Temp(Src) 99.8 F (37.7 C) (Oral)  Resp 19  Ht 5' 8" (1.727 m)  Wt 73.9 kg (162 lb 14.7 oz)  BMI 24.78 kg/m2  SpO2 97%  SpO2: SpO2: 97 % O2 Device:SpO2: 97 % O2 Flow Rate: .   IO: Intake/output summary:  Intake/Output Summary (Last 24 hours) at 09/16/15 1426 Last data filed at 09/16/15 1400  Gross per 24 hour  Intake   1660 ml  Output   1040 ml  Net    620 ml    LBM: Last BM Date: 09/14/15 Baseline Weight: Weight: 73 kg  (160 lb 15 oz) Most recent weight: Weight: 73.9 kg (162 lb 14.7 oz)      Palliative Assessment/Data:  Flowsheet Rows        Most Recent Value   Intake Tab    Referral Department  Critical care   Unit at Time of Referral  ICU   Palliative Care Primary Diagnosis  Neurology   Date Notified  09/15/15   Palliative Care Type  New Palliative care   Reason for referral  Clarify Goals of Care   Date of Admission  09/14/2015   Date first seen by Palliative Care  09/16/15   # of days Palliative referral response time  1 Day(s)   # of days IP prior to Palliative referral  4   Clinical Assessment    Palliative Performance Scale Score  10%   Pain Max last 24 hours  Not able to report   Pain Min Last 24 hours  Not able to report   Dyspnea Max Last 24 Hours  Not able to report   Dyspnea Min Last 24 hours  Not able to report   Nausea Max Last 24 Hours  Not able to report   Nausea Min Last 24 Hours  Not able to report   Anxiety Max Last 24 Hours  Not able to report   Anxiety Min Last 24 Hours  Not able to report   Other Max Last 24 Hours  Not able to report   Psychosocial & Spiritual Assessment    Palliative Care Outcomes    Patient/Family meeting held?  Yes   Who was at the meeting?  wife, pt's brotherr, sister, her  dtr, grandchildren and neurologist   Palliative Care Outcomes  Clarified goals of care   Patient/Family wishes: Interventions discontinued/not started   Trach   Palliative Care follow-up planned  Yes, Facility      Additional Data Reviewed:  CBC:    Component Value Date/Time   WBC 11.0* 09/16/2015 0500   HGB 10.7* 09/16/2015 0500   HGB 10.8* 06/23/2013 1122   HCT 35.4* 09/16/2015 0500   PLT 105* 09/16/2015 0500   MCV 92.4 09/16/2015 0500   NEUTROABS 16.2* 09/12/2015 0438   LYMPHSABS 1.3 09/12/2015 0438   MONOABS 1.5* 09/12/2015 0438   EOSABS 0.0 09/12/2015 0438   BASOSABS 0.0 09/12/2015 0438   Comprehensive Metabolic Panel:    Component Value Date/Time   NA 138  09/16/2015 0500   NA 145* 01/30/2014 1033   K 3.4* 09/16/2015 0500   CL 102 09/16/2015 0500   CO2 23 09/16/2015 0500   BUN 39* 09/16/2015 0500   BUN 39* 01/30/2014 1033   CREATININE 4.95* 09/16/2015 0500   CREATININE 5.14* 09/21/2013 1014   GLUCOSE 89 09/16/2015 0500   GLUCOSE 84 01/30/2014 1033   CALCIUM 8.2* 09/16/2015 0500   CALCIUM 7.9* 06/29/2012 1345   AST 48* 09/16/2015 0500   ALT 20 09/16/2015 0500   ALKPHOS 68 09/16/2015 0500   BILITOT 0.6 09/16/2015 0500   PROT 6.2* 09/16/2015 0500   PROT 6.3 01/30/2014 1033   ALBUMIN 2.8* 09/16/2015 0500   ALBUMIN 4.3 01/30/2014 1033     Time In: 1130 Time Out: 1300 Time Total: 90 min Greater than 50%  of this time was spent counseling and coordinating care related to the above assessment and plan.  Signed by: Dory Horn, NP  Dory Horn, NP  09/16/2015, 2:26 PM  Please contact Palliative Medicine Team phone at [phone] for questions and concerns.

## 2015-09-16 NOTE — Progress Notes (Signed)
PULMONARY / CRITICAL CARE MEDICINE   Name: John [name] MRN: [id number] DOB: 03/18/1956    ADMISSION DATE:  09/24/2015 CONSULTATION DATE:  09/23/2015  REFERRING MD:  [facility]  CHIEF COMPLAINT:  Acute encephalopathy/Seizures  HISTORY OF PRESENT ILLNESS:   John [name] is a 60 year old man with a past medical history significant for ESRD on HD TTS, Insulin Dependent Type 2 DM, PAD s/p left BKA, COPD, CKD Stage III, Stroke/TIAs presented with lethargy and acute encephalopathy. Patient missed Thursday and Saturday dialysis sessions (prior notes say transportation issues, family reports clotting at dialysis). Did receive 2 hours of dialysis today but ended early due to clotting off. Family reports first noticing a change in mental status yesterday with no improvement after dialysis today. Patient was seen in [facility] today and was able to converse with them and answer questions but was very confused. He was sent for direct admission from clinic. He had a brief seizure while in CT scan on arrival with another seizure once he arrived to the floor. Did not receive any medications as the seizures lasted <1 minute. Mental status has deteriorated and patient is now somnolent and only responds to painful stimuli. No history of EOTH use or cirrhosis.   Found to have new RUL lung nodule with a large right lobe liver lesion. CT of his head shows old infarcts. Patient's mental status has not improved following the seizures and PCCM was called to evaluate the patient.  PAST MEDICAL HISTORY :  He  has a past medical history of COPD (chronic obstructive pulmonary disease) ([place]); Chronic back pain; Hyperlipidemia; Foot pain; Peripheral vascular disease ([place]); Asthma; Ulcer (1985); Fracture of foot; Neuromuscular disorder ([place]); Chronic kidney disease; Hypertension; Stroke [facility]); Diabetes mellitus; and Arthritis.  PAST SURGICAL HISTORY: He  has past surgical history that includes Neck surgery; Eye  surgery; Cardiac catheterization; Endarterectomy (12/18/2011); Carotid endarterectomy (Right); Axillary-femoral Bypass Graft (06/18/2012); Femoral-popliteal Bypass Graft (06/18/2012); Amputation (06/18/2012); AV fistula placement (06/24/2012); lower extremity angiogram (Right, 11/21/2011); Enucleation (Left); Below knee leg amputation (Left); Amputation toe (Left, 2016); Transmetatarsal amputation (Left, 2016); Ligation of arteriovenous  fistula (Left, 08/27/2015); Insertion of dialysis catheter (Left, 08/27/2015); and Back surgery.  Allergies  Allergen Reactions  . Nicoderm [Nicotine] Itching and Rash  . Gabapentin Other (See Comments)    Can't walk, shuts legs down  . Vicodin [Hydrocodone-Acetaminophen] Hives and Itching    Current Facility-Administered Medications on File Prior to Encounter  Medication  . ferumoxytol (FERAHEME) 1,020 mg in sodium chloride 0.9 % 100 mL IVPB   Current Outpatient Prescriptions on File Prior to Encounter  Medication Sig  . aspirin 81 MG chewable tablet Chew 81 mg by mouth daily.   Marland Kitchen atorvastatin (LIPITOR) 40 MG tablet Take 40 mg by mouth every evening.  . cephALEXin (KEFLEX) 500 MG capsule Take 1 capsule (500 mg total) by mouth daily.  . cholecalciferol (VITAMIN D) 1000 UNITS tablet Take 1,000 Units by mouth daily.  . collagenase (SANTYL) ointment Apply 1 application topically daily. Please apply to left popliteal fossa  . collagenase (SANTYL) ointment Apply 1 application topically daily.  . fentaNYL (DURAGESIC - DOSED MCG/HR) 12 MCG/HR Place 1 patch (12.5 mcg total) onto the skin every 3 (three) days. Change patch tomorrow  . hydrocerin (EUCERIN) CREA Apply 1 application topically 2 (two) times daily. (Patient taking differently: Apply 1 application topically daily. )  . insulin glargine (LANTUS) 100 UNIT/ML injection Inject 0.05 mLs (5 Units total) into the skin daily. (  Patient taking differently: Inject 5 Units into the skin daily. )  . insulin lispro (HUMALOG)  100 UNIT/ML injection Inject 2-3 Units into the skin 2 (two) times daily as needed for high blood sugar (CBG >180). Per sliding scale: inject 2 units subcutaneously for CBG 180-299, inject 3 units for CBG >300  . lanthanum (FOSRENOL) 1000 MG chewable tablet Chew 1,000 mg by mouth See admin instructions. Take 1 tablet (1000 mg) by mouth with large meals (once every 2-3 days)  . lidocaine-EPINEPHrine-tetracaine (LET) GEL Apply topically to left arm 1 hour before dialysis  . lidocaine-prilocaine (EMLA) cream Apply 1 application topically 3 (three) times a week. Reported on 08/23/2015  . methocarbamol (ROBAXIN) 500 MG tablet Take 1 tablet (500 mg total) by mouth every 6 (six) hours as needed for muscle spasms.  . multivitamin (RENA-VIT) TABS tablet Take 1 tablet by mouth daily.   Marland Kitchen oxyCODONE-acetaminophen (PERCOCET) 10-325 MG tablet Take one by mouth  Every 6 hours prn pain do not fill before September 14 2015  . PROAIR HFA 108 (90 Base) MCG/ACT inhaler INHALE 2 PUFFS BY MOUTH EVERY 4 HOURS AS NEEDED FOR WHEEZING OR SHORTNESS OF BREATH  . [DISCONTINUED] tiotropium (SPIRIVA HANDIHALER) 18 MCG inhalation capsule Place 1 capsule (18 mcg total) into inhaler and inhale daily.    FAMILY HISTORY:  His indicated that his mother is deceased. He indicated that his father is deceased. He indicated that his brother is alive.   SOCIAL HISTORY: He  reports that he has been smoking Cigarettes.  He has a 8 pack-year smoking history. He has never used smokeless tobacco. He reports that he does not drink alcohol or use illicit drugs.  REVIEW OF SYSTEMS:   Not performed due to patient being intubated.  SUBJECTIVE:  Patient is sedated and intubated on a ventilator.  He is not following commands.  He has some eye opening.  He spiked another fever this morning to 101.  VITAL SIGNS: BP 89/49 mmHg  Pulse 105  Temp(Src) 99.7 F (37.6 C) (Oral)  Resp 28  Ht 5\' 8"  (1.727 m)  Wt 162 lb 14.7 oz (73.9 kg)  BMI 24.78 kg/m2   SpO2 100%  HEMODYNAMICS:    VENTILATOR SETTINGS: Vent Mode:  [-] CPAP;PSV FiO2 (%):  [30 %-40 %] 30 % Set Rate:  [14 bmp] 14 bmp Vt Set:  [550 mL] 550 mL PEEP:  [5 cmH20] 5 cmH20 Pressure Support:  [10 cmH20] 10 cmH20 Plateau Pressure:  [17 cmH20-18 cmH20] 18 cmH20  INTAKE / OUTPUT: I/O last 3 completed shifts: In: 2857 [I.V.:150; Other:220; NG/GT:1450; IV [id number] Out: 892 [Urine:192; Stool:1200]  PHYSICAL EXAMINATION: General:  Ill-appearing man, lying in bed, sedated, intubated, eyes open, not following commands Neuro:  No seizure activity noted, withdraws from pain HEENT:  Tongue slightly protruded, prosthetic LEFT eye Cardiovascular:  RRR, no m/r/g Lungs:  Ventilator assisted breaths CTAB anteriorly, no wheeze or crackles Abdomen:  Soft, BS+ GI/GU: rectal tube and Foley catheter in place Skin:  Left BKA, anterior left knee with unstageable 4x2 cm scabbed wound with no odor/drainage.  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  Present prior to admission.  Right chest wall with bandage over HD catheter site.  LABS:  BMET  Recent Labs Lab 09/14/15 0532 09/15/15 0242 09/16/15 0500  NA 133* 135 138  K 3.7 3.0* 3.4*  CL 93* 95* 102  CO2 24 23 23   BUN 42* 67* 39*  CREATININE 5.31* 7.20* 4.95*  GLUCOSE 70 104* 89  Electrolytes  Recent Labs Lab 09/26/2015 2009  09/14/15 0532 09/15/15 0242 09/16/15 0500  CALCIUM 9.0  < > 7.7* 7.9* 8.2*  MG 2.2  --   --  2.1 2.1  PHOS  --   < > 5.2* 6.1* 3.4  < > = values in this interval not displayed.  CBC  Recent Labs Lab 09/14/15 0500 09/15/15 0242 09/16/15 0500  WBC 14.7* 10.6* 11.0*  HGB 10.8* 10.4* 10.7*  HCT 34.5* 34.8* 35.4*  PLT 98* 105* 105*    Coag's  Recent Labs Lab 09/12/15 1029 09/16/15 0500  INR 1.28 1.18    Sepsis Markers  Recent Labs Lab 09/22/2015 1952 09/12/15 1029  LATICACIDVEN 6.0* 1.1    ABG  Recent Labs Lab 09/12/15 0038 09/12/15 0453  PHART 7.285* 7.375   PCO2ART 50.7* 42.7  PO2ART 132* 378*    Liver Enzymes  Recent Labs Lab 09/21/2015 2009  09/12/15 1029 09/13/15 0450 09/14/15 0532 09/16/15 0500  AST 25  --  17  --   --  48*  ALT 12*  --  10*  --   --  20  ALKPHOS 104  --  90  --   --  68  BILITOT 0.6  --  0.9  --   --  0.6  ALBUMIN 3.9  < > 3.6 3.2* 2.9* 2.8*  < > = values in this interval not displayed.  Cardiac Enzymes  Recent Labs Lab 09/30/2015 1943  TROPONINI 0.15*    Glucose  Recent Labs Lab 09/15/15 0758 09/15/15 1142 09/15/15 1607 09/15/15 1933 09/15/15 2348 09/16/15 0503  GLUCAP 111* 85 123* 110* 182* 80    Imaging No results found.   STUDIES:  04/04 > CXR with new RUL pulmonary nodule  04/04 > 2.4 x 2.3 x 2.0 cm RUL nodule. 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  04/05 > MRI brain Symmetric abnormal hippocampi signal compatible status epilepticus, and postictal state.  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  Old LEFT thalamus and LEFT basal ganglia lacunar infarcts. Old RIGHT internal capsule infarct. Old tiny cerebellar infarcts. 04/05 > CT abdomen: 1. The dominant right lobe hepatic lesion is consistent with hemangioma and was previously demonstrated to represent hemangioma in 2007. Dimensions appear to have increased with maximum diameter now of 14.5 cm compared to approximately 12 cm previously. There is no evidence by CT of hemorrhagic complication related to the giant Hemangioma.  2. Incidental detection of a solid-appearing right renal mass measuring approximately 2.1 cm and emanating from the lower pole anteriorly. This is concerning for renal carcinoma.   CULTURES: Blood  04/04 > NGTD x 4 days Urine 04/05 > NG Final Tracheal aspirate 04/05 > no organisms seen on Gram stain, Culture with normal oropharyngeal flora C. Diff antigen positive, toxin negative 04/07   ANTIBIOTICS: Vancomycin 04/04 > 04/07 Zosyn 04/04 > 04/07  LINES/TUBES: CVC 04/04 > HD Cath Right IJ 3/20 > NG/OG Tube 04/04 > Foley 04/04 > Rectal Tube 04/07 >  ASSESSMENT / PLAN: 60 year old man with ESRD presenting with acute encephalopathy found to have a new RUL lung nodule that may be suspicious for metastatic disease.  Also with new onset seizures of unclear etiology.  PULMONARY A: Acute  hypoxic hypercarbic respiratory failure likely secondary to AMS/seizure COPD Lung nodule RUL, likely cancer P:   - continue mechanical ventilation, PST if wakes up more - nebulizers for COPD - Pulmicort BID, Duonebs QID  CARDIOVASCULAR A:  History of PAD Hypotension SVT on admission with HR 180 P:  - Metoprolol 2.5-5mg  IV Q3H prn - Fluid bolus as needed  RENAL A:   ESRD on HD TThSat Hypokalemia Right renal mass seen on CT concerning for renal carcinoma P:   - nephrology following - HD per nephrology - replacing K this morning with 2 runs 23mEq IV - renal function panel daily - consider MRI to better assess renal mass pending GOC discussion with family  GASTROINTESTINAL A:   Intubated in ICU Moderate, Non-severe Protein Calorie Malnutrition P:   - Famotidine 20mg  per tube Q24H - NPO - Tube feeds, nutrition consult  HEMATOLOGIC/ONC A:   Liver lesion, likely hemangioma Right renal mass RUL lung nodule Thrombocytopenia  DVT PPx P:  - RUL lung nodule, likely to need percutaneous biopsy but on hold with encephalopathy, poor prognosis - DVT PPx > Heparin - Platelets stable  INFECTIOUS A:   Leukoctyosis - stable Fevers - Tmax last 24 hours 101.4, unclear source of fevers with nothing suggestive of infection from pan-cultures at this point and WBC stable.  Possibly due to  seizures or drug reaction or malignancy. P:   - follow up blood cx, currently no growth at day 4 - tracheal aspirate culture with no organisms on Gram stain, normal oropharyngeal flora - continue observing off antibiotics - follow WBC, fever curve - WOC for chronic pressure ulcer  ENDOCRINE A:   Insulin dependent DM2  Hypoglycemia - resolved after stopping Lantus P:   - CBG Q4H - SSI  NEUROLOGIC A:   New onset seizures, unclear etiology Status epilepticus Fevers - as above may be neurologically mediated P:  - Neurology consult appreciated - AED's per neurology - currently on Vimpat, Keppra, Valproate, Topamax - Propofol gtt being held - Intensive EEG monitoring with simultaneous monitoring c/w resolution of status epilepticus and improvement in background activities - MRI brain results as above - RASS goal: -1 - Palliative consult    FAMILY  - Updates: Wife -  [name] can be reached at [phone] or [name] [phone]   Pulmonary and [place] Pager: [phone]  09/16/2015, 7:57 AM

## 2015-09-17 ENCOUNTER — Inpatient Hospital Stay (HOSPITAL_COMMUNITY): Payer: Medicare Other

## 2015-09-17 DIAGNOSIS — Z515 Encounter for palliative care: Secondary | ICD-10-CM | POA: Insufficient documentation

## 2015-09-17 DIAGNOSIS — Z7189 Other specified counseling: Secondary | ICD-10-CM | POA: Insufficient documentation

## 2015-09-17 LAB — CBC
HCT: 32.8 % — ABNORMAL LOW (ref 39.0–52.0)
Hemoglobin: 9.7 g/dL — ABNORMAL LOW (ref 13.0–17.0)
MCH: 27.3 pg (ref 26.0–34.0)
MCHC: 29.6 g/dL — AB (ref 30.0–36.0)
MCV: 92.4 fL (ref 78.0–100.0)
PLATELETS: 101 10*3/uL — AB (ref 150–400)
RBC: 3.55 MIL/uL — AB (ref 4.22–5.81)
RDW: 17.3 % — ABNORMAL HIGH (ref 11.5–15.5)
WBC: 11.3 10*3/uL — ABNORMAL HIGH (ref 4.0–10.5)

## 2015-09-17 LAB — GLUCOSE, CAPILLARY
GLUCOSE-CAPILLARY: 126 mg/dL — AB (ref 65–99)
GLUCOSE-CAPILLARY: 71 mg/dL (ref 65–99)
GLUCOSE-CAPILLARY: 87 mg/dL (ref 65–99)
Glucose-Capillary: 94 mg/dL (ref 65–99)

## 2015-09-17 LAB — AMMONIA: AMMONIA: 18 umol/L (ref 9–35)

## 2015-09-17 LAB — RENAL FUNCTION PANEL
Albumin: 2.7 g/dL — ABNORMAL LOW (ref 3.5–5.0)
Anion gap: 17 — ABNORMAL HIGH (ref 5–15)
BUN: 65 mg/dL — ABNORMAL HIGH (ref 6–20)
CALCIUM: 8.4 mg/dL — AB (ref 8.9–10.3)
CO2: 21 mmol/L — AB (ref 22–32)
CREATININE: 7.36 mg/dL — AB (ref 0.61–1.24)
Chloride: 102 mmol/L (ref 101–111)
GFR calc non Af Amer: 7 mL/min — ABNORMAL LOW (ref >60)
GFR, EST AFRICAN AMERICAN: 8 mL/min — AB (ref >60)
GLUCOSE: 87 mg/dL (ref 65–99)
Phosphorus: 5.6 mg/dL — ABNORMAL HIGH (ref 2.5–4.6)
Potassium: 3.5 mmol/L (ref 3.5–5.1)
SODIUM: 140 mmol/L (ref 135–145)

## 2015-09-17 LAB — MAGNESIUM: MAGNESIUM: 2.2 mg/dL (ref 1.7–2.4)

## 2015-09-17 MED ORDER — HEPARIN SODIUM (PORCINE) 5000 UNIT/ML IJ SOLN
5000.0000 [IU] | Freq: Three times a day (TID) | INTRAMUSCULAR | Status: DC
  Administered 2015-09-17 – 2015-09-18 (×2): 5000 [IU] via SUBCUTANEOUS
  Filled 2015-09-17: qty 1

## 2015-09-17 MED ORDER — LORAZEPAM 2 MG/ML IJ SOLN: 2.0000 mg | INTRAMUSCULAR | Status: DC | PRN

## 2015-09-17 MED ORDER — FENTANYL CITRATE (PF) 100 MCG/2ML IJ SOLN
100.0000 ug | INTRAMUSCULAR | Status: DC | PRN
  Administered 2015-09-18 (×2): 100 ug via INTRAVENOUS
  Filled 2015-09-17 (×2): qty 2

## 2015-09-17 MED ORDER — GLYCOPYRROLATE 0.2 MG/ML IJ SOLN
0.4000 mg | Freq: Once | INTRAMUSCULAR | Status: AC
  Administered 2015-09-17: 0.4 mg via INTRAVENOUS
  Filled 2015-09-17: qty 2

## 2015-09-17 MED ORDER — GLYCOPYRROLATE 0.2 MG/ML IJ SOLN
0.2000 mg | Freq: Four times a day (QID) | INTRAMUSCULAR | Status: DC | PRN
  Administered 2015-09-18: 0.2 mg via INTRAVENOUS
  Filled 2015-09-17 (×2): qty 1

## 2015-09-17 MED ORDER — RENA-VITE PO TABS: 1.0000 | ORAL_TABLET | Freq: Every day | ORAL | Status: DC

## 2015-09-17 MED ORDER — FENTANYL CITRATE (PF) 100 MCG/2ML IJ SOLN
100.0000 ug | Freq: Once | INTRAMUSCULAR | Status: AC
  Administered 2015-09-17: 100 ug via INTRAVENOUS
  Filled 2015-09-17: qty 2

## 2015-09-17 MED ORDER — LORAZEPAM 2 MG/ML IJ SOLN
1.0000 mg | Freq: Once | INTRAMUSCULAR | Status: AC
  Administered 2015-09-17: 1 mg via INTRAVENOUS
  Filled 2015-09-17: qty 1

## 2015-09-17 MED ORDER — IPRATROPIUM-ALBUTEROL 0.5-2.5 (3) MG/3ML IN SOLN: 3.0000 mL | RESPIRATORY_TRACT | Status: DC | PRN

## 2015-09-17 NOTE — Procedures (Signed)
Continuous Video-EEG Monitoring Report  Patient: John [name], John [name]     EEG No.ID: [id number]     DOB: 09/17/1955 Age: 60 Room#2M03  MED REC NO: [id number] Gender: Male   TECH: Minerva Areola     Physician: Winfield Cunas     Referring Physician: Fenton Malling   Report Date: 09/17/2015                                                             Study Duration: 09/16/2015 07:30 to 09/17/2015 07:30 CPT Code:  [id number] Diagnosis:  Seizures (R56.9); Altered mental status (R41.82)  History: This is a 60 year old male presenting with seizures and altered mental status.  Video-EEG monitoring was performed to evaluate for seizures.  Technical Details:  Long-term video-EEG monitoring was performed using standard setting per the guidelines.  Briefly, a minimum of 21 electrodes were placed on scalp according to the International 10-20 or/and 10-10 Systems.  Supplemental electrodes were placed as needed.  Single EKG electrode was also used to detect cardiac arrhythmia.  Patient's behavior was continuously recorded on video simultaneously with EEG.  A minimum of 16 channels were used for data display.  Each epoch of study was reviewed manually daily and as needed using standard referential and bipolar montages.    EEG Description:  There was generalized polymorphic delta and theta slowing (up to 6 Hz).  No posterior dominant rhythm or sleep architecture was recorded.  Diffuse beta activity was also seen, which could be due to medication effect (e.g., propofol, benzodiazepine).  No epileptiform discharges or seizures were in evidence.  Impression:  This is an abnormal EEG due to the presence of generalized polymorphic delta and theta slowing, suggesting moderate to severe encephalopathy, but medication effect could not be excluded.  No epileptiform discharges or seizures were in evidence.    [place] Physician: Winfield Cunas, MD, PhD

## 2015-09-17 NOTE — Progress Notes (Signed)
PULMONARY / CRITICAL CARE MEDICINE   Name: John [name] MRN: [id number] DOB: 1955/09/08    ADMISSION DATE:  09/24/2015 CONSULTATION DATE:  09/08/2015  REFERRING MD:  Family Med Teaching Service  CHIEF COMPLAINT:  Acute encephalopathy/Seizures  HISTORY OF PRESENT ILLNESS:   John [name] is a 60 year old man with a past medical history significant for ESRD on HD TTS, Insulin Dependent Type 2 DM, PAD s/p left BKA, COPD, CKD Stage III, Stroke/TIAs presented with lethargy and acute encephalopathy. Patient missed Thursday and Saturday dialysis sessions (prior notes say transportation issues, family reports clotting at dialysis). Did receive 2 hours of dialysis today but ended early due to clotting off. Family reports first noticing a change in mental status yesterday with no improvement after dialysis today. Patient was seen in [facility] today and was able to converse with them and answer questions but was very confused. He was sent for direct admission from clinic. He had a brief seizure while in CT scan on arrival with another seizure once he arrived to the floor. Did not receive any medications as the seizures lasted <1 minute. Mental status has deteriorated and patient is now somnolent and only responds to painful stimuli. No history of EOTH use or cirrhosis.   Found to have new RUL lung nodule with a large right lobe liver lesion. CT of his head shows old infarcts. Patient's mental status has not improved following the seizures and PCCM was called to evaluate the patient.  PAST MEDICAL HISTORY :  He  has a past medical history of COPD (chronic obstructive pulmonary disease) ([place]); Chronic back pain; Hyperlipidemia; Foot pain; Peripheral vascular disease ([place]); Asthma; Ulcer (1985); Fracture of foot; Neuromuscular disorder ([place]); Chronic kidney disease; Hypertension; Stroke [facility]); Diabetes mellitus; and Arthritis.  PAST SURGICAL HISTORY: He  has past surgical history that includes Neck surgery; Eye  surgery; Cardiac catheterization; Endarterectomy (12/18/2011); Carotid endarterectomy (Right); Axillary-femoral Bypass Graft (06/18/2012); Femoral-popliteal Bypass Graft (06/18/2012); Amputation (06/18/2012); AV fistula placement (06/24/2012); lower extremity angiogram (Right, 11/21/2011); Enucleation (Left); Below knee leg amputation (Left); Amputation toe (Left, 2016); Transmetatarsal amputation (Left, 2016); Ligation of arteriovenous  fistula (Left, 08/27/2015); Insertion of dialysis catheter (Left, 08/27/2015); and Back surgery.  Allergies  Allergen Reactions  . Nicoderm [Nicotine] Itching and Rash  . Gabapentin Other (See Comments)    Can't walk, shuts legs down  . Vicodin [Hydrocodone-Acetaminophen] Hives and Itching    Current Facility-Administered Medications on File Prior to Encounter  Medication  . ferumoxytol (FERAHEME) 1,020 mg in sodium chloride 0.9 % 100 mL IVPB   Current Outpatient Prescriptions on File Prior to Encounter  Medication Sig  . aspirin 81 MG chewable tablet Chew 81 mg by mouth daily.   Marland Kitchen atorvastatin (LIPITOR) 40 MG tablet Take 40 mg by mouth every evening.  . cephALEXin (KEFLEX) 500 MG capsule Take 1 capsule (500 mg total) by mouth daily.  . cholecalciferol (VITAMIN D) 1000 UNITS tablet Take 1,000 Units by mouth daily.  . collagenase (SANTYL) ointment Apply 1 application topically daily. Please apply to left popliteal fossa  . collagenase (SANTYL) ointment Apply 1 application topically daily.  . fentaNYL (DURAGESIC - DOSED MCG/HR) 12 MCG/HR Place 1 patch (12.5 mcg total) onto the skin every 3 (three) days. Change patch tomorrow  . hydrocerin (EUCERIN) CREA Apply 1 application topically 2 (two) times daily. (Patient taking differently: Apply 1 application topically daily. )  . insulin glargine (LANTUS) 100 UNIT/ML injection Inject 0.05 mLs (5 Units total) into the skin daily. (  Patient taking differently: Inject 5 Units into the skin daily. )  . insulin lispro (HUMALOG)  100 UNIT/ML injection Inject 2-3 Units into the skin 2 (two) times daily as needed for high blood sugar (CBG >180). Per sliding scale: inject 2 units subcutaneously for CBG 180-299, inject 3 units for CBG >300  . lanthanum (FOSRENOL) 1000 MG chewable tablet Chew 1,000 mg by mouth See admin instructions. Take 1 tablet (1000 mg) by mouth with large meals (once every 2-3 days)  . lidocaine-EPINEPHrine-tetracaine (LET) GEL Apply topically to left arm 1 hour before dialysis  . lidocaine-prilocaine (EMLA) cream Apply 1 application topically 3 (three) times a week. Reported on 08/23/2015  . methocarbamol (ROBAXIN) 500 MG tablet Take 1 tablet (500 mg total) by mouth every 6 (six) hours as needed for muscle spasms.  . multivitamin (RENA-VIT) TABS tablet Take 1 tablet by mouth daily.   Marland Kitchen oxyCODONE-acetaminophen (PERCOCET) 10-325 MG tablet Take one by mouth  Every 6 hours prn pain do not fill before September 14 2015  . PROAIR HFA 108 (90 Base) MCG/ACT inhaler INHALE 2 PUFFS BY MOUTH EVERY 4 HOURS AS NEEDED FOR WHEEZING OR SHORTNESS OF BREATH  . [DISCONTINUED] tiotropium (SPIRIVA HANDIHALER) 18 MCG inhalation capsule Place 1 capsule (18 mcg total) into inhaler and inhale daily.    FAMILY HISTORY:  His indicated that his mother is deceased. He indicated that his father is deceased. He indicated that his brother is alive.   SOCIAL HISTORY: He  reports that he has been smoking Cigarettes.  He has a 8 pack-year smoking history. He has never used smokeless tobacco. He reports that he does not drink alcohol or use illicit drugs.  REVIEW OF SYSTEMS:   Not performed due to patient being intubated.  SUBJECTIVE:  Patient is intubated on ventilator, off sedation, no further seizure noted.  He opens his eyes, does not follow commands.  VITAL SIGNS: BP 107/54 mmHg  Pulse 97  Temp(Src) 100.1 F (37.8 C) (Oral)  Resp 22  Ht 5\' 8"  (1.727 m)  Wt 160 lb 4.4 oz (72.7 kg)  BMI 24.38 kg/m2  SpO2 98%  HEMODYNAMICS:     VENTILATOR SETTINGS: Vent Mode:  [-] CPAP;PSV FiO2 (%):  [30 %] 30 % Set Rate:  [14 bmp] 14 bmp Vt Set:  [550 mL] 550 mL PEEP:  [5 cmH20] 5 cmH20 Pressure Support:  [10 cmH20] 10 cmH20 Plateau Pressure:  [18 cmH20-20 cmH20] 20 cmH20  INTAKE / OUTPUT: I/O last 3 completed shifts: In: 2333 [I.V.:360; NG/GT:1380; IV [id number] Out: 2380 [Urine:80; Emesis/NG output:100; Stool:2200]  PHYSICAL EXAMINATION: General:  Ill-appearing man, intubated, opens eyes to voice, no following commands Neuro:  No seizure activity noted, withdraws from pain HEENT:  Tongue slightly protruded, prosthetic LEFT eye, opens RIGHT eye Cardiovascular:  RRR Lungs:  Ventilator assisted breaths CTAB anteriorly, no wheeze or crackles Abdomen:  Soft, BS+ GI/GU: rectal tube and Foley catheter in place Skin:  Left BKA, anterior left knee with unstageable 4x2 cm scabbed wound with no odor/drainage.  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  Present prior to admission.  LABS:  BMET  Recent Labs Lab 09/15/15 0242 09/16/15 0500 09/17/15 0651  NA 135 138 140  K 3.0* 3.4* 3.5  CL 95* 102 102  CO2 23 23 21*  BUN 67* 39* 65*  CREATININE 7.20* 4.95* 7.36*  GLUCOSE 104* 89 87    Electrolytes  Recent Labs Lab 09/15/15 0242 09/16/15 0500 09/17/15 0651  CALCIUM 7.9* 8.2* 8.4*  MG  2.1 2.1 2.2  PHOS 6.1* 3.4 5.6*    CBC  Recent Labs Lab 09/15/15 0242 09/16/15 0500 09/17/15 0651  WBC 10.6* 11.0* 11.3*  HGB 10.4* 10.7* 9.7*  HCT 34.8* 35.4* 32.8*  PLT 105* 105* 101*    Coag's  Recent Labs Lab 09/12/15 1029 09/16/15 0500  INR 1.28 1.18    Sepsis Markers  Recent Labs Lab 09/25/2015 1952 09/12/15 1029  LATICACIDVEN 6.0* 1.1    ABG  Recent Labs Lab 09/12/15 0038 09/12/15 0453  PHART 7.285* 7.375  PCO2ART 50.7* 42.7  PO2ART 132* 378*    Liver Enzymes  Recent Labs Lab 09/10/2015 2009  09/12/15 1029  09/14/15 0532 09/16/15 0500 09/17/15 0651  AST 25  --  17  --   --   48*  --   ALT 12*  --  10*  --   --  20  --   ALKPHOS 104  --  90  --   --  68  --   BILITOT 0.6  --  0.9  --   --  0.6  --   ALBUMIN 3.9  < > 3.6  < > 2.9* 2.8* 2.7*  < > = values in this interval not displayed.  Cardiac Enzymes  Recent Labs Lab 09/16/2015 1943  TROPONINI 0.15*    Glucose  Recent Labs Lab 09/16/15 1230 09/16/15 1614 09/16/15 2026 09/16/15 2336 09/17/15 0434 09/17/15 0745  GLUCAP 94 105* 142* 71 94 87    Imaging Dg Chest Port 1 View  09/17/2015  CLINICAL DATA:  Endotracheal tube intubated. EXAM: PORTABLE CHEST 1 VIEW COMPARISON:  For a 2017 FINDINGS: Endotracheal tube remains in good position. Right jugular dialysis catheter in the mid right atrium unchanged. Left jugular central venous catheter tip in the proximal SVC unchanged. NG tube in the proximal stomach. Side hole in the distal esophagus. Lungs are well aerated without infiltrate or effusion. No significant collapse Right upper lobe nodule noted. IMPRESSION: Support lines remain in good position.  Lungs remain clear. Right upper lobe nodule unchanged. Electronically Signed   By: Franchot Gallo M.D.   On: 09/17/2015 07:06     STUDIES:  04/04 > CXR with new RUL pulmonary nodule  04/04 > 2.4 x 2.3 x 2.0 cm RUL nodule. 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  04/05 > MRI brain Symmetric abnormal hippocampi signal compatible status epilepticus, and postictal state.  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  Old LEFT thalamus and LEFT basal ganglia lacunar infarcts. Old RIGHT internal capsule infarct. Old tiny cerebellar infarcts. 04/05 > CT abdomen: 1. The dominant right lobe hepatic lesion is  consistent with hemangioma and was previously demonstrated to represent hemangioma in 2007. Dimensions appear to have increased with maximum diameter now of 14.5 cm compared to approximately 12 cm previously. There is no evidence by CT of hemorrhagic complication related to the giant Hemangioma.  2. Incidental detection of a solid-appearing right renal mass measuring approximately 2.1 cm and emanating from the lower pole anteriorly. This is concerning for renal carcinoma.   CULTURES: Blood 04/04 > NG Final Urine 04/05 > NG Final Tracheal aspirate 04/05 > no organisms seen on Gram stain, Culture with normal oropharyngeal flora C. Diff antigen positive,  toxin negative 04/07   ANTIBIOTICS: Vancomycin 04/04 > 04/07 Zosyn 04/04 > 04/07  LINES/TUBES: CVC 04/04 > HD Cath Right IJ 3/20 > NG/OG Tube 04/04 > Foley 04/04 > Rectal Tube 04/07 >  ASSESSMENT / PLAN: 60 year old man with ESRD presenting with acute encephalopathy found to have a new RUL lung nodule that may be suspicious for metastatic disease.  Also with new onset seizures of unclear etiology.  PULMONARY A: Acute hypoxic hypercarbic respiratory failure likely secondary to AMS/seizure COPD Lung nodule RUL, suspicious for cancer P:   - continue mechanical ventilation, possible extubation today pending conversation with wife - nebulizers for COPD - Pulmicort BID, Duonebs QID  CARDIOVASCULAR A:  History of PAD Hypotension SVT on admission with HR 180 P:  - Metoprolol 2.5-5mg  IV Q3H prn - Fluid bolus as needed  RENAL A:   ESRD on HD TThSat Right renal mass seen on CT concerning for renal carcinoma Anion Gap Metabolic Acidosis P:   - nephrology following - HD per nephrology - consider MRI to better assess renal mass pending GOC discussion with family  GASTROINTESTINAL A:   Intubated in ICU Moderate, Non-severe Protein Calorie Malnutrition P:   - Famotidine 20mg  per tube Q24H - NPO - Tube feeds, nutrition  consult  HEMATOLOGIC/ONC A:   Liver lesion, likely hemangioma Right renal mass RUL lung nodule Thrombocytopenia  DVT PPx P:  - RUL lung nodule, likely to need percutaneous biopsy but on hold with encephalopathy, poor prognosis - DVT PPx > Heparin - Platelets stable  INFECTIOUS A:   Leukoctyosis - stable Fevers - afebrile last 24hours P:   - final blood culture negative - tracheal aspirate culture with no organisms on Gram stain, normal oropharyngeal flora - continue observing off antibiotics - follow WBC, fever curve - WOC for chronic pressure ulcer  ENDOCRINE A:   Insulin dependent DM2  Hypoglycemia - resolved after stopping Lantus P:   - CBG Q4H - SSI  NEUROLOGIC A:   New onset seizures, unclear etiology Status epilepticus Severe anoxia per MRI/EEG Fevers - as above may be neurologically mediated P:  - Neurology consult appreciated - AED's per neurology - currently on Vimpat, Keppra, Valproate, Topamax - Intensive EEG with moderate to severe encephalopathy.  Poor prognosis for meaningful neurologic recovery - MRI brain results as above - RASS goal: -1 - Palliative consult appreciated    FAMILY  - Updates: Wife -  [name] can be reached at [phone] or [name] [phone]   Pulmonary and [place] Pager: [phone]  09/17/2015, 11:36 AM

## 2015-09-17 NOTE — Progress Notes (Addendum)
Daily Progress Note   Patient Name: John [name]       Date: 09/17/2015 DOB: 1955-06-25  Age: 60 y.o. MRN#: [id number] Attending Physician: Blane Ohara McDiarmid, MD Primary Care Physician: [name], MD Admit Date: 09/22/2015  Reason for Consultation/Follow-up: Withdrawal of life-sustaining treatment  Subjective: NA  Interval Events: No MS improvement.  Patient unresponsive.  EEG shows severe encephalopathy.  Family has decided to extubate without reintubation at 3:00 pm today.  Per wife, patient does not want to live in a nursing home (he had been in one briefly before)   Length of Stay: 6 days  Current Medications: Scheduled Meds:  . antiseptic oral rinse  7 mL Mouth Rinse QID  . budesonide (PULMICORT) nebulizer solution  0.5 mg Nebulization BID  . chlorhexidine gluconate (SAGE KIT)  15 mL Mouth Rinse BID  . collagenase   Topical Daily  . famotidine  20 mg Per Tube Daily  . feeding supplement (NEPRO CARB STEADY)  1,000 mL Per Tube Q24H  . feeding supplement (PRO-STAT SUGAR FREE 64)  30 mL Per Tube QID  . heparin subcutaneous  5,000 Units Subcutaneous 3 times per day  . insulin aspart  0-15 Units Subcutaneous 6 times per day  . ipratropium-albuterol  3 mL Nebulization QID  . lacosamide (VIMPAT) IV  100 mg Intravenous Q T,Th,Sa-HD  . lacosamide (VIMPAT) IV  200 mg Intravenous Q12H  . levETIRAcetam  1,000 mg Intravenous Q24H  . levETIRAcetam  500 mg Intravenous Q T,Th,Sa-HD  . multivitamin  1 tablet Oral QHS  . sodium chloride  250 mL Intravenous Once  . topiramate  100 mg Oral Q12H  . valproate sodium  400 mg Intravenous 4 times per day    Continuous Infusions: . sodium chloride 10 mL/hr at 09/17/15 0700  . propofol (DIPRIVAN) infusion Stopped (09/14/15 0900)    PRN  Meds: sodium chloride, sodium chloride, sodium chloride, sodium chloride, acetaminophen (TYLENOL) oral liquid 160 mg/5 mL, alteplase, fentaNYL (SUBLIMAZE) injection, heparin, heparin, lidocaine (PF), lidocaine-prilocaine, LORazepam, metoprolol, midazolam, pentafluoroprop-tetrafluoroeth, topiramate   Physical Exam             Frail elderly appearing black male, intubated and sleeping in the ICU.  Wife, grand dtr, sister in law at bedside. Resp:  Appears comfortable. CV: Regular LLE BKA, RLE missing 2 digits.  Vital Signs: BP 93/49 mmHg  Pulse 92  Temp(Src) 100.1 F (37.8 C) (Oral)  Resp 22  Ht '5\' 8"'  (1.727 m)  Wt 72.7 kg (160 lb 4.4 oz)  BMI 24.38 kg/m2  SpO2 98% SpO2: SpO2: 98 % O2 Device: O2 Device: Ventilator O2 Flow Rate:    Intake/output summary:  Intake/Output Summary (Last 24 hours) at 09/17/15 1100 Last data filed at 09/17/15 1000  Gross per 24 hour  Intake   1402 ml  Output   1480 ml  Net    -78 ml   LBM: Last BM Date: 09/17/15 Baseline Weight: Weight: 73 kg (160 lb 15 oz) Most recent weight: Weight: 72.7 kg (160 lb 4.4 oz)       Palliative Assessment/Data: Flowsheet Rows        Most Recent Value   Intake Tab    Referral Department  Critical care   Unit at Time of Referral  ICU   Palliative Care Primary Diagnosis  Neurology   Date Notified  09/15/15   Palliative Care Type  New Palliative care   Reason for referral  Clarify Goals of Care   Date of Admission  09/14/2015   Date first seen by Palliative Care  09/16/15   # of days Palliative referral response time  1 Day(s)   # of days IP prior to Palliative referral  4   Clinical Assessment    Palliative Performance Scale Score  10%   Pain Max last 24 hours  Not able to report   Pain Min Last 24 hours  Not able to report   Dyspnea Max Last 24 Hours  Not able to report   Dyspnea Min Last 24 hours  Not able to report   Nausea Max Last 24 Hours  Not able to report   Nausea Min Last 24 Hours  Not able to report    Anxiety Max Last 24 Hours  Not able to report   Anxiety Min Last 24 Hours  Not able to report   Other Max Last 24 Hours  Not able to report   Psychosocial & Spiritual Assessment    Palliative Care Outcomes    Patient/Family meeting held?  Yes   Who was at the meeting?  wife, pt's brotherr, sister, her dtr, grandchildren and neurologist   Palliative Care Outcomes  Clarified goals of care   Patient/Family wishes: Interventions discontinued/not started   [place] follow-up planned  Yes, Facility      Additional Data Reviewed: CBC    Component Value Date/Time   WBC 11.3* 09/17/2015 0651   RBC 3.55* 09/17/2015 0651   HGB 9.7* 09/17/2015 0651   HGB 10.8* 06/23/2013 1122   HCT 32.8* 09/17/2015 0651   PLT 101* 09/17/2015 0651   MCV 92.4 09/17/2015 0651   MCH 27.3 09/17/2015 0651   MCHC 29.6* 09/17/2015 0651   RDW 17.3* 09/17/2015 0651   LYMPHSABS 1.3 09/12/2015 0438   MONOABS 1.5* 09/12/2015 0438   EOSABS 0.0 09/12/2015 0438   BASOSABS 0.0 09/12/2015 0438    CMP     Component Value Date/Time   NA 140 09/17/2015 0651   NA 145* 01/30/2014 1033   K 3.5 09/17/2015 0651   CL 102 09/17/2015 0651   CO2 21* 09/17/2015 0651   GLUCOSE 87 09/17/2015 0651   GLUCOSE 84 01/30/2014 1033   BUN 65* 09/17/2015 0651   BUN 39* 01/30/2014 1033   CREATININE 7.36* 09/17/2015 0651   CREATININE 5.14* 09/21/2013 1014  CALCIUM 8.4* 09/17/2015 0651   CALCIUM 7.9* 06/29/2012 1345   PROT 6.2* 09/16/2015 0500   PROT 6.3 01/30/2014 1033   ALBUMIN 2.7* 09/17/2015 0651   ALBUMIN 4.3 01/30/2014 1033   AST 48* 09/16/2015 0500   ALT 20 09/16/2015 0500   ALKPHOS 68 09/16/2015 0500   BILITOT 0.6 09/16/2015 0500   GFRNONAA 7* 09/17/2015 0651   GFRAA 8* 09/17/2015 0651       Problem List:  Patient Active Problem List   Diagnosis Date Noted  . Nonintractable epilepsy without status epilepticus ([place])   . Chronic kidney disease (CKD), stage IV (severe) ([place])   . Palliative care  encounter   . Seizures ([place])   . Malnutrition of moderate degree 09/13/2015  . Status epilepticus ([place])   . Acute respiratory failure with hypoxemia ([place])   . Liver lesion   . Acute encephalopathy 10/01/2015  . Leg ulcer ([place]) 08/09/2015  . S/P BKA (below knee amputation) unilateral ([place]), LEFT 07/30/2015  . Dysesthesia   . Anemia of chronic disease   . Abnormality of gait   . ESRD on dialysis ([place])   . Type 2 diabetes mellitus with complication, with long-term current use of insulin ([place])   . PAD (peripheral artery disease) ([place])   . Knee gives out 07/03/2014  . Left upper arm pain 03/20/2014  . Encounter for chronic pain management 03/10/2014  . Peripheral vascular disease, unspecified ([place]) 03/08/2014  . Fecal incontinence 03/23/2013  . PVD (peripheral vascular disease) ([place]) 07/28/2012  . hand pain , steal syndrome LEFT 07/28/2012  . Mitral valve regurgitation 07/05/2012  . Hyperparathyroidism, secondary renal ([place]) 07/05/2012  . Anemia of renal disease 07/05/2012  . Cervical myelopathy ([place]) 07/05/2012  . Occlusion and stenosis of carotid artery without mention of cerebral infarction 12/03/2011  . Atherosclerosis of native arteries of the extremities with intermittent claudication 11/12/2011  . Right carotid bruit 11/12/2011  . Knee pain, left 08/08/2011  . COPD (chronic obstructive pulmonary disease) ([place]) 01/11/2011  . Backache 12/15/2008  . ESRD (end stage renal disease) on dialysis ([place]) 01/21/2008  . NEUROPATHY, DIABETIC 08/06/2006  . Diabetes mellitus with renal complications ([place]) 40/98/1191  . HYPERLIPIDEMIA 08/06/2006  . TOBACCO DEPENDENCE 08/06/2006  . HYPERTENSION, BENIGN SYSTEMIC 08/06/2006  . 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 OSTEOARTHRITIS, LOWER LEG 08/06/2006  . History of cardiovascular disorder 08/06/2006     Palliative Care Assessment & Plan    1.Code Status: DNR   2. Goals of Care/Additional Recommendations:  Limitations on Scope of  Treatment: Extubation without reintubation.  Will pull the feeding tube with the ETT.  When patient stabilizes he may be transferred to [place].  Desire for further Chaplaincy support:  Yes  Psycho-social Needs: Caregiving  Support/Resources and Grief/Bereavement Support  3. Symptom Management:      Pain / Dyspnea:  Fentanyl      Agitation / Seizures:  Ativan      Secretions:  Robinul      Will continue seizure medications for now (Vimpat, Depacon, and Keppra)  4. Palliative Prophylaxis:   Aspiration, Bowel Regimen, Eye Care, Oral Care, Palliative Wound Care and Turn Reposition  5. Prognosis:  To be determined.  We will monitor how he does with extubation, but likely he has only hours to days.  6. Discharge Planning:  To be determined.  Hospital death is certainly likely.  If he stabilizes he may require a hospice home.   Care plan was discussed with Wife and family at bedside, PCCM  attending and resident  Thank you for allowing the Palliative Medicine Team to assist in the care of this patient.   Time In: 10:30 Time Out: 11:30 Total Time 60 min. Prolonged Time Billed no        Melton Alar, PA-C  09/17/2015, 11:00 AM  Please contact Palliative Medicine Team phone at [phone] for questions and concerns.

## 2015-09-17 NOTE — Progress Notes (Signed)
   09/17/15 1600  Clinical Encounter Type  Visited With Patient;Patient and family together  Visit Type Spiritual support  Referral From Family  Spiritual Encounters  Spiritual Needs Grief support;Prayer  Stress Factors  Family Stress Factors Loss  Patient being extubated. Prayed with family, facilitated sharing of stories about patient with wife and family.

## 2015-09-17 NOTE — Progress Notes (Signed)
Subjective: Interval History: has no complaint, entub, EEG on.  .  Objective: Vital signs in last 24 hours: Temp:  [98.7 F (37.1 C)-99.8 F (37.7 C)] 99.6 F (37.6 C) (04/10 0436) Pulse Rate:  [86-108] 94 (04/10 0500) Resp:  [14-32] 17 (04/10 0500) BP: (81-156)/(44-112) 98/58 mmHg (04/10 0500) SpO2:  [90 %-100 %] 99 % (04/10 0500) FiO2 (%):  [30 %] 30 % (04/10 0400) Weight:  [72.7 kg (160 lb 4.4 oz)] 72.7 kg (160 lb 4.4 oz) (04/10 0500) Weight change: -2.6 kg (-5 lb 11.7 oz)  Intake/Output from previous day: 04/09 0701 - 04/10 0700 In: 1545 [I.V.:220; NG/GT:890; IV Piggyback:435] Out: 420 [Urine:20; Emesis/NG output:100; Stool:300] Intake/Output this shift: Total I/O In: 540 [I.V.:100; NG/GT:440] Out: 0   General appearance: on vent,  moves, not purposeful, EEG on ,  Resp: rhonchi bilaterally Chest wall: RIJ PC, L IJ line, Cardio: regular rate and rhythm and systolic murmur: holosystolic 2/6, blowing throughout the precordium GI: soft, non-tender; bowel sounds normal; no masses,  no organomegaly Extremities: L BKA dressed, toes off on R,   Lab Results:  Recent Labs  09/15/15 0242 09/16/15 0500  WBC 10.6* 11.0*  HGB 10.4* 10.7*  HCT 34.8* 35.4*  PLT 105* 105*   BMET:  Recent Labs  09/15/15 0242 09/16/15 0500  NA 135 138  K 3.0* 3.4*  CL 95* 102  CO2 23 23  GLUCOSE 104* 89  BUN 67* 39*  CREATININE 7.20* 4.95*  CALCIUM 7.9* 8.2*   No results for input(s): PTH in the last 72 hours. Iron Studies: No results for input(s): IRON, TIBC, TRANSFERRIN, FERRITIN in the last 72 hours.  Studies/Results: No results found.  I have reviewed the patient's current medications.  Assessment/Plan: 1 ESRD  Will hold off HD with poss extub, with drawal 2 PVD severe 3 Access will need stump AVG if cont 4 DM controlled 5 lung mass/renal mass 1 or 2 cancers probable 6 Liver Mass ??? Benign 7 Status epilepticus with brain damage P comfort, poss extub.     LOS: 6 days    Dionisios Ricci L 09/17/2015,6:49 AM

## 2015-09-17 NOTE — Procedures (Signed)
Extubation Procedure Note  Patient Details:   Name: John [name] DOB: 1955/09/06 MRN: [id number]   Airway Documentation:     Evaluation  O2 sats: stable throughout Complications: No apparent complications Patient did tolerate procedure well. Bilateral Breath Sounds: Clear, Diminished   No   Positive cuff leak noted prior to extubation. Pt placed on nasal cannula 4 Lpm. Family present.  Bayard Beaver 09/17/2015, 3:38 PM

## 2015-09-18 DIAGNOSIS — Z7189 Other specified counseling: Secondary | ICD-10-CM

## 2015-09-18 MED ORDER — ALBUTEROL SULFATE (2.5 MG/3ML) 0.083% IN NEBU: 2.5000 mg | INHALATION_SOLUTION | Freq: Four times a day (QID) | RESPIRATORY_TRACT | Status: DC | PRN

## 2015-09-18 NOTE — Progress Notes (Signed)
Nutrition Brief Note  Chart reviewed. Patient was extubated on 4/10 with no plans for re-intubation.  Pt now transitioning to comfort care. Hospital death likely per review of progress notes. No further nutrition interventions warranted at this time.  Please re-consult as needed.   Molli Barrows, RD, LDN, [place] Pager [phone] After Hours Pager [phone]

## 2015-09-18 NOTE — Progress Notes (Signed)
Subjective: Interval History: extub one way, ? To go to hospice.  Objective: Vital signs in last 24 hours: Temp:  [99 F (37.2 C)-100.1 F (37.8 C)] 99 F (37.2 C) (04/10 1140) Pulse Rate:  [88-117] 109 (04/11 0600) Resp:  [14-32] 27 (04/11 0600) BP: (85-115)/(46-68) 98/57 mmHg (04/11 0600) SpO2:  [94 %-100 %] 94 % (04/11 0600) FiO2 (%):  [30 %] 30 % (04/10 1200) Weight:  [72.6 kg (160 lb 0.9 oz)] 72.6 kg (160 lb 0.9 oz) (04/11 0400) Weight change: -0.1 kg (-3.5 oz)  Intake/Output from previous day: 04/10 0701 - 04/11 0700 In: 981 [I.V.:275; NG/GT:245; IV Piggyback:461] Out: 109 [Urine:109] Intake/Output this shift: Total I/O In: 308 [I.V.:155; IV Piggyback:153] Out: 74 [Urine:74]  General appearance: responds to verbal stim but  no meanigful interaction Resp: large airway wheezes, rhonchi Cardio: systolic murmur: holosystolic 2/6, blowing at apex GI: soft, non-tender; bowel sounds normal; no masses,  no organomegaly Extremities: L BKA , toes off on R  , R PC  Lab Results:  Recent Labs  09/16/15 0500 09/17/15 0651  WBC 11.0* 11.3*  HGB 10.7* 9.7*  HCT 35.4* 32.8*  PLT 105* 101*   BMET:  Recent Labs  09/16/15 0500 09/17/15 0651  NA 138 140  K 3.4* 3.5  CL 102 102  CO2 23 21*  GLUCOSE 89 87  BUN 39* 65*  CREATININE 4.95* 7.36*  CALCIUM 8.2* 8.4*   No results for input(s): PTH in the last 72 hours. Iron Studies: No results for input(s): IRON, TIBC, TRANSFERRIN, FERRITIN in the last 72 hours.  Studies/Results: Dg Chest Port 1 View  09/17/2015  CLINICAL DATA:  Endotracheal tube intubated. EXAM: PORTABLE CHEST 1 VIEW COMPARISON:  For a 2017 FINDINGS: Endotracheal tube remains in good position. Right jugular dialysis catheter in the mid right atrium unchanged. Left jugular central venous catheter tip in the proximal SVC unchanged. NG tube in the proximal stomach. Side hole in the distal esophagus. Lungs are well aerated without infiltrate or effusion. No  significant collapse Right upper lobe nodule noted. IMPRESSION: Support lines remain in good position.  Lungs remain clear. Right upper lobe nodule unchanged. Electronically Signed   By: Franchot Gallo M.D.   On: 09/17/2015 07:06    I have reviewed the patient's current medications.  Assessment/Plan: 1 Prolong sz, enceph.  Plans for comfort. Will s/o and see again at  Your request    LOS: 7 days   Nicolette Gieske L 09/18/2015,6:49 AM

## 2015-09-18 NOTE — Progress Notes (Signed)
Daily Progress Note   Patient Name: John [name]       Date: 09/18/2015 DOB: 1956-03-31  Age: 60 y.o. MRN#: [id number] Attending Physician: John Ohara McDiarmid, MD Primary Care Physician: John Mcmurray, MD Admit Date: 10/06/2015  Reason for Consultation/Follow-up: Disposition and Terminal Care  Subjective:  Unable  Interval Events: Patient is extubated and breathing on his own in a more stable fashion.  He is more alert.  He is making eye contact and appears to try to speak.  Wife is really struggling with goals of care decisions.  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  He also has an unusually large hematoma in his liver.  We discussed his dislike of hemo dialysis and the pain it caused him.  She reiterated that he never wanted to live in a SNF.    John [name] tells me that his fate is in God's hands.  However, she is have difficulty making the decision to discontinue HD.  She feels that would be "killing" him.  John [name] are both PCP patients of John [name].   John [name] commented that she would like to confer with John [name] about HD.  John [name] was extremely gracious when we called and she came to the patient's room immediately.    John [name] and John [name] agreed to a 48 hour trial - if within the next 48 hours John [name] is able to sit up and request HD so that "he can go fishing" family medicine teaching service would work to arrange that for him.  Other wise John [name] is comfortable with "comfort care".  Patient being transferred out of ICU today to floor bed.  Length of Stay: 7 days  Current Medications: Scheduled Meds:  . antiseptic oral  rinse  7 mL Mouth Rinse QID  . chlorhexidine gluconate (SAGE KIT)  15 mL Mouth Rinse BID  . collagenase   Topical Daily  . lacosamide (VIMPAT) IV  100 mg Intravenous Q T,Th,Sa-HD  . lacosamide (VIMPAT) IV  200 mg Intravenous Q12H  . levETIRAcetam  1,000 mg Intravenous Q24H  . levETIRAcetam  500 mg Intravenous Q T,Th,Sa-HD  . sodium chloride  250 mL Intravenous Once  . valproate sodium  400 mg Intravenous 4 times per day    Continuous Infusions: . sodium chloride 10 mL/hr at 09/17/15 2000    PRN Meds: sodium chloride, sodium chloride, sodium chloride, sodium chloride, acetaminophen (TYLENOL) oral liquid 160 mg/5 mL, alteplase, fentaNYL (SUBLIMAZE) injection, glycopyrrolate, ipratropium-albuterol, lidocaine (PF), lidocaine-prilocaine, LORazepam, midazolam, pentafluoroprop-tetrafluoroeth   Physical Exam               Frail elderly appearing black male, eyes open.  Appears comfortable on 4L n/c HEENT:  Atraumatic, normocephalic, right pupil reactive. Resp: No increased work of breathing. CV: Regular LLE BKA, RLE missing 2 digits. Moves extremities.   Vital Signs: BP 93/57 mmHg  Pulse 97  Temp(Src) 100 F (37.8 C) (Oral)  Resp 17  Ht _0  (1.727 m)  Wt 72.6 kg (160 lb 0.9 oz)  BMI 24.34 kg/m2  SpO2 90% SpO2: SpO2: 90 % O2 Device: O2 Device: Nasal Cannula O2 Flow Rate: O2 Flow Rate (L/min): 4 L/min  Intake/output summary:  Intake/Output Summary (Last 24 hours) at 09/18/15 1209 Last data filed at 09/18/15 1100  Gross per 24 hour  Intake    717 ml  Output    194 ml  Net    523 ml   LBM: Last BM Date: 09/18/15 (flexiseal in place, draining) Baseline Weight: Weight: 73 kg (160 lb 15 oz) Most recent weight: Weight: 72.6 kg (160 lb 0.9 oz)       Palliative Assessment/Data: Flowsheet Rows        Most Recent Value   Intake Tab    Referral Department  Critical care   Unit at Time of Referral  ICU   Palliative Care Primary Diagnosis  Neurology   Date Notified   09/15/15   Palliative Care Type  New Palliative care   Reason for referral  Clarify Goals of Care   Date of Admission  09/22/2015   Date first seen by Palliative Care  09/16/15   # of days Palliative referral response time  1 Day(s)   # of days IP prior to Palliative referral  4   Clinical Assessment    Palliative Performance Scale Score  10%   Pain Max last 24 hours  Not able to report   Pain Min Last 24 hours  Not able to report   Dyspnea Max Last 24 Hours  Not able to report   Dyspnea Min Last 24 hours  Not able to report   Nausea Max Last 24 Hours  Not able to report   Nausea Min Last 24 Hours  Not able to report   Anxiety Max Last 24 Hours  Not able to report   Anxiety Min Last 24 Hours  Not able to report   Other Max Last 24 Hours  Not able to report   Psychosocial & Spiritual Assessment    Palliative Care Outcomes    Patient/Family meeting held?  Yes   Who was at the meeting?  wife, pt's brotherr, sister, her dtr, grandchildren and neurologist   Palliative Care Outcomes  Clarified goals of care   Patient/Family wishes: Interventions discontinued/not started   John [name] follow-up planned  Yes, Facility      Additional Data Reviewed: CBC    Component Value Date/Time   WBC 11.3* 09/17/2015 0651   RBC 3.55* 09/17/2015 0651   HGB 9.7* 09/17/2015 0651   HGB 10.8* 06/23/2013 1122   HCT 32.8* 09/17/2015 0651   PLT  101* 09/17/2015 0651   MCV 92.4 09/17/2015 0651   MCH 27.3 09/17/2015 0651   MCHC 29.6* 09/17/2015 0651   RDW 17.3* 09/17/2015 0651   LYMPHSABS 1.3 09/12/2015 0438   MONOABS 1.5* 09/12/2015 0438   EOSABS 0.0 09/12/2015 0438   BASOSABS 0.0 09/12/2015 0438    CMP     Component Value Date/Time   NA 140 09/17/2015 0651   NA 145* 01/30/2014 1033   K 3.5 09/17/2015 0651   CL 102 09/17/2015 0651   CO2 21* 09/17/2015 0651   GLUCOSE 87 09/17/2015 0651   GLUCOSE 84 01/30/2014 1033   BUN 65* 09/17/2015 0651   BUN 39* 01/30/2014 1033   CREATININE  7.36* 09/17/2015 0651   CREATININE 5.14* 09/21/2013 1014   CALCIUM 8.4* 09/17/2015 0651   CALCIUM 7.9* 06/29/2012 1345   PROT 6.2* 09/16/2015 0500   PROT 6.3 01/30/2014 1033   ALBUMIN 2.7* 09/17/2015 0651   ALBUMIN 4.3 01/30/2014 1033   AST 48* 09/16/2015 0500   ALT 20 09/16/2015 0500   ALKPHOS 68 09/16/2015 0500   BILITOT 0.6 09/16/2015 0500   GFRNONAA 7* 09/17/2015 0651   GFRAA 8* 09/17/2015 0651       Problem List:  Patient Active Problem List   Diagnosis Date Noted  . Terminal care   . Encounter for hospice care discussion   . Nonintractable epilepsy without status epilepticus ([place])   . Chronic kidney disease (CKD), stage IV (severe) ([place])   . Palliative care encounter   . Seizures ([place])   . Malnutrition of moderate degree 09/13/2015  . Status epilepticus ([place])   . Acute respiratory failure with hypoxemia ([place])   . Liver lesion   . Acute encephalopathy 09/13/2015  . Leg ulcer ([place]) 08/09/2015  . S/P BKA (below knee amputation) unilateral ([place]), LEFT 07/30/2015  . Dysesthesia   . Anemia of chronic disease   . Abnormality of gait   . ESRD on dialysis ([place])   . Type 2 diabetes mellitus with complication, with long-term current use of insulin ([place])   . PAD (peripheral artery disease) ([place])   . Knee gives out 07/03/2014  . Left upper arm pain 03/20/2014  . Encounter for chronic pain management 03/10/2014  . Peripheral vascular disease, unspecified ([place]) 03/08/2014  . Fecal incontinence 03/23/2013  . PVD (peripheral vascular disease) ([place]) 07/28/2012  . hand pain , steal syndrome LEFT 07/28/2012  . Mitral valve regurgitation 07/05/2012  . Hyperparathyroidism, secondary renal ([place]) 07/05/2012  . Anemia of renal disease 07/05/2012  . Cervical myelopathy ([place]) 07/05/2012  . Occlusion and stenosis of carotid artery without mention of cerebral infarction 12/03/2011  . Atherosclerosis of native arteries of the extremities with intermittent claudication 11/12/2011  .  Right carotid bruit 11/12/2011  . Knee pain, left 08/08/2011  . COPD (chronic obstructive pulmonary disease) ([place]) 01/11/2011  . Backache 12/15/2008  . ESRD (end stage renal disease) on dialysis ([place]) 01/21/2008  . NEUROPATHY, DIABETIC 08/06/2006  . Diabetes mellitus with renal complications ([place]) 12/03/9483  . HYPERLIPIDEMIA 08/06/2006  . TOBACCO DEPENDENCE 08/06/2006  . HYPERTENSION, BENIGN SYSTEMIC 08/06/2006  . 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 OSTEOARTHRITIS, LOWER LEG 08/06/2006  . History of cardiovascular disorder 08/06/2006     Palliative Care Assessment & Plan    1.Code Status: DNR   2. Goals of Care/Additional Recommendations:   Discontinuation of HD is still somewhat up in the air.  Decision to be finalized by Thursday.  Continue IV Seizure medications   (Vimpat, Keppra, and Depacon)  PMT recommending full comfort.    Wound care to stump and left 5th digit  Patient not alert enough to eat or take POs.   If he becomes more alert he will need a swallow evaluation.    Family has chosen not to persue a PEG.   3. Symptom Management:      1. Will continue with PRN pushes of fentanyl for pain, dyspnea at this point patient is requiring very little.      2.  Ativan PRN for anxiety or seizure      3.  Robinul PRN for secretions.   4. Palliative Prophylaxis:   Aspiration, Bowel Regimen, Delirium Protocol, Eye Care, Frequent Pain Assessment, Oral Care, Palliative Wound Care and Turn Reposition  5. Prognosis: < 2 weeks.  ESRD with no hemodialysis.  6. Discharge Planning:  Hospice facility - is recommended.     Care plan was discussed with PCCM Attending, Nephrology, and family.  Thank you for allowing the Palliative Medicine Team to assist in the care of this patient.   Time In: 11 Time Out: 12 Total Time 60 Prolonged Time Billed no        Melton Alar, PA-C  09/18/2015, 12:09 PM  Please contact Palliative Medicine Team phone at [phone] for  questions and concerns.

## 2015-09-18 NOTE — Progress Notes (Signed)
PULMONARY / CRITICAL CARE MEDICINE   Name: John [name] MRN: [id number] DOB: 12-11-1955    ADMISSION DATE:  09/26/2015 CONSULTATION DATE:  09/16/2015  REFERRING MD:  Family Med Teaching Service  CHIEF COMPLAINT:  Acute encephalopathy/Seizures  HISTORY OF PRESENT ILLNESS:   John [name] is a 60 year old man with a past medical history significant for ESRD on HD TTS, Insulin Dependent Type 2 DM, PAD s/p left BKA, COPD, CKD Stage III, Stroke/TIAs presented with lethargy and acute encephalopathy. Patient missed Thursday and Saturday dialysis sessions (prior notes say transportation issues, family reports clotting at dialysis). Did receive 2 hours of dialysis today but ended early due to clotting off. Family reports first noticing a change in mental status yesterday with no improvement after dialysis today. Patient was seen in [facility] today and was able to converse with them and answer questions but was very confused. He was sent for direct admission from clinic. He had a brief seizure while in CT scan on arrival with another seizure once he arrived to the floor. Did not receive any medications as the seizures lasted <1 minute. Mental status has deteriorated and patient is now somnolent and only responds to painful stimuli. No history of EOTH use or cirrhosis.   Found to have new RUL lung nodule with a large right lobe liver lesion. CT of his head shows old infarcts. Patient's mental status has not improved following the seizures and PCCM was called to evaluate the patient.  PAST MEDICAL HISTORY :  He  has a past medical history of COPD (chronic obstructive pulmonary disease) ([place]); Chronic back pain; Hyperlipidemia; Foot pain; Peripheral vascular disease ([place]); Asthma; Ulcer (1985); Fracture of foot; Neuromuscular disorder ([place]); Chronic kidney disease; Hypertension; Stroke [facility]); Diabetes mellitus; and Arthritis.  PAST SURGICAL HISTORY: He  has past surgical history that includes Neck surgery; Eye  surgery; Cardiac catheterization; Endarterectomy (12/18/2011); Carotid endarterectomy (Right); Axillary-femoral Bypass Graft (06/18/2012); Femoral-popliteal Bypass Graft (06/18/2012); Amputation (06/18/2012); AV fistula placement (06/24/2012); lower extremity angiogram (Right, 11/21/2011); Enucleation (Left); Below knee leg amputation (Left); Amputation toe (Left, 2016); Transmetatarsal amputation (Left, 2016); Ligation of arteriovenous  fistula (Left, 08/27/2015); Insertion of dialysis catheter (Left, 08/27/2015); and Back surgery.  Allergies  Allergen Reactions  . Nicoderm [Nicotine] Itching and Rash  . Gabapentin Other (See Comments)    Can't walk, shuts legs down  . Vicodin [Hydrocodone-Acetaminophen] Hives and Itching    Current Facility-Administered Medications on File Prior to Encounter  Medication  . ferumoxytol (FERAHEME) 1,020 mg in sodium chloride 0.9 % 100 mL IVPB   Current Outpatient Prescriptions on File Prior to Encounter  Medication Sig  . aspirin 81 MG chewable tablet Chew 81 mg by mouth daily.   Marland Kitchen atorvastatin (LIPITOR) 40 MG tablet Take 40 mg by mouth every evening.  . cephALEXin (KEFLEX) 500 MG capsule Take 1 capsule (500 mg total) by mouth daily.  . cholecalciferol (VITAMIN D) 1000 UNITS tablet Take 1,000 Units by mouth daily.  . collagenase (SANTYL) ointment Apply 1 application topically daily. Please apply to left popliteal fossa  . collagenase (SANTYL) ointment Apply 1 application topically daily.  . fentaNYL (DURAGESIC - DOSED MCG/HR) 12 MCG/HR Place 1 patch (12.5 mcg total) onto the skin every 3 (three) days. Change patch tomorrow  . hydrocerin (EUCERIN) CREA Apply 1 application topically 2 (two) times daily. (Patient taking differently: Apply 1 application topically daily. )  . insulin glargine (LANTUS) 100 UNIT/ML injection Inject 0.05 mLs (5 Units total) into the skin daily. (  Patient taking differently: Inject 5 Units into the skin daily. )  . insulin lispro (HUMALOG)  100 UNIT/ML injection Inject 2-3 Units into the skin 2 (two) times daily as needed for high blood sugar (CBG >180). Per sliding scale: inject 2 units subcutaneously for CBG 180-299, inject 3 units for CBG >300  . lanthanum (FOSRENOL) 1000 MG chewable tablet Chew 1,000 mg by mouth See admin instructions. Take 1 tablet (1000 mg) by mouth with large meals (once every 2-3 days)  . lidocaine-EPINEPHrine-tetracaine (LET) GEL Apply topically to left arm 1 hour before dialysis  . lidocaine-prilocaine (EMLA) cream Apply 1 application topically 3 (three) times a week. Reported on 08/23/2015  . methocarbamol (ROBAXIN) 500 MG tablet Take 1 tablet (500 mg total) by mouth every 6 (six) hours as needed for muscle spasms.  . multivitamin (RENA-VIT) TABS tablet Take 1 tablet by mouth daily.   Marland Kitchen oxyCODONE-acetaminophen (PERCOCET) 10-325 MG tablet Take one by mouth  Every 6 hours prn pain do not fill before September 14 2015  . PROAIR HFA 108 (90 Base) MCG/ACT inhaler INHALE 2 PUFFS BY MOUTH EVERY 4 HOURS AS NEEDED FOR WHEEZING OR SHORTNESS OF BREATH  . [DISCONTINUED] tiotropium (SPIRIVA HANDIHALER) 18 MCG inhalation capsule Place 1 capsule (18 mcg total) into inhaler and inhale daily.    FAMILY HISTORY:  His indicated that his mother is deceased. He indicated that his father is deceased. He indicated that his brother is alive.   SOCIAL HISTORY: He  reports that he has been smoking Cigarettes.  He has a 8 pack-year smoking history. He has never used smokeless tobacco. He reports that he does not drink alcohol or use illicit drugs.  REVIEW OF SYSTEMS:   Not performed due to patient being intubated.  SUBJECTIVE:  Patient is responding to some verbal stimuli, he is not following commands.  He underwent 1-way extubation yesterday with family present.  VITAL SIGNS: BP 113/55 mmHg  Pulse 100  Temp(Src) 99 F (37.2 C) (Oral)  Resp 23  Ht 5\' 8"  (1.727 m)  Wt 160 lb 0.9 oz (72.6 kg)  BMI 24.34 kg/m2  SpO2  97%  HEMODYNAMICS:    VENTILATOR SETTINGS: Vent Mode:  [-] CPAP;PSV FiO2 (%):  [30 %] 30 % PEEP:  [5 cmH20] 5 cmH20 Pressure Support:  [10 cmH20] 10 cmH20  INTAKE / OUTPUT: I/O last 3 completed shifts: In: 1739 [I.V.:415; NG/GT:755; IV Piggyback:569] Out: [id number] [Urine:144; Stool:1000]  PHYSICAL EXAMINATION: General:  Ill-appearing man, lying in bed, responds some to voice, not following commands Neuro:  Seizure activity not noted HEENT:  Tongue slightly protruded, prosthetic LEFT eye, opens RIGHT eye Cardiovascular:  RRR Lungs:  Mildly increased work of breathing Abdomen:  Soft, no obvious tenderness, no distention  GI/GU: rectal tube and Foley catheter in place Skin:  Left BKA, anterior left knee with unstageable 4x2 cm scabbed wound with no odor/drainage.  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  Present prior to admission.  LABS:  BMET  Recent Labs Lab 09/15/15 0242 09/16/15 0500 09/17/15 0651  NA 135 138 140  K 3.0* 3.4* 3.5  CL 95* 102 102  CO2 23 23 21*  BUN 67* 39* 65*  CREATININE 7.20* 4.95* 7.36*  GLUCOSE 104* 89 87    Electrolytes  Recent Labs Lab 09/15/15 0242 09/16/15 0500 09/17/15 0651  CALCIUM 7.9* 8.2* 8.4*  MG 2.1 2.1 2.2  PHOS 6.1* 3.4 5.6*    CBC  Recent Labs Lab 09/15/15 0242 09/16/15 0500 09/17/15 [id number]  WBC 10.6* 11.0* 11.3*  HGB 10.4* 10.7* 9.7*  HCT 34.8* 35.4* 32.8*  PLT 105* 105* 101*    Coag's  Recent Labs Lab 09/12/15 1029 09/16/15 0500  INR 1.28 1.18    Sepsis Markers  Recent Labs Lab 09/18/2015 1952 09/12/15 1029  LATICACIDVEN 6.0* 1.1    ABG  Recent Labs Lab 09/12/15 0038 09/12/15 0453  PHART 7.285* 7.375  PCO2ART 50.7* 42.7  PO2ART 132* 378*    Liver Enzymes  Recent Labs Lab 09/30/2015 2009  09/12/15 1029  09/14/15 0532 09/16/15 0500 09/17/15 0651  AST 25  --  17  --   --  48*  --   ALT 12*  --  10*  --   --  20  --   ALKPHOS 104  --  90  --   --  68  --   BILITOT 0.6  --  0.9   --   --  0.6  --   ALBUMIN 3.9  < > 3.6  < > 2.9* 2.8* 2.7*  < > = values in this interval not displayed.  Cardiac Enzymes  Recent Labs Lab 09/27/2015 1943  TROPONINI 0.15*    Glucose  Recent Labs Lab 09/16/15 1614 09/16/15 2026 09/16/15 2336 09/17/15 0434 09/17/15 0745 09/17/15 1132  GLUCAP 105* 142* 71 94 87 126*    Imaging No results found.   STUDIES:  04/04 > CXR with new RUL pulmonary nodule  04/04 > 2.4 x 2.3 x 2.0 cm RUL nodule. 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  04/05 > MRI brain Symmetric abnormal hippocampi signal compatible status epilepticus, and postictal state.  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  Old LEFT thalamus and LEFT basal ganglia lacunar infarcts. Old RIGHT internal capsule infarct. Old tiny cerebellar infarcts. 04/05 > CT abdomen: 1. The dominant right lobe hepatic lesion is consistent with hemangioma and was previously demonstrated to represent hemangioma in 2007. Dimensions appear to have increased with maximum diameter now of 14.5 cm compared to approximately 12 cm previously. There is no evidence by CT of hemorrhagic complication related to the giant Hemangioma.  2. Incidental detection of a solid-appearing right renal mass measuring approximately 2.1 cm and emanating from the lower pole anteriorly. This is concerning for renal carcinoma.   CULTURES: Blood 04/04 > NG Final Urine 04/05 > NG Final Tracheal aspirate 04/05 > no organisms seen on Gram stain, Culture with normal oropharyngeal flora C. Diff antigen positive, toxin negative 04/07   ANTIBIOTICS: Vancomycin 04/04 > 04/07 Zosyn 04/04 >  04/07  LINES/TUBES: CVC 04/04 > HD Cath Right IJ 3/20 > NG/OG Tube 04/04 > Foley 04/04 > Rectal Tube 04/07 >  ASSESSMENT / PLAN: 60 year old man with ESRD presenting with acute encephalopathy found to have a new RUL lung nodule that may be suspicious for metastatic disease.  Also with new onset seizures of unclear etiology.  PULMONARY A: Acute hypoxic hypercarbic respiratory failure likely secondary to AMS/seizure COPD Lung nodule RUL, suspicious for cancer P:   - 1-way extubation yesterday - comfort care per palliative - currently has fentanyl and ativan q48min as needed  CARDIOVASCULAR A:  History of PAD Hypotension SVT on admission with HR 180 P:  - stop  Metoprolol 2.5-5mg  IV Q3H prn  RENAL A:   ESRD on HD TThSat Right renal mass seen on CT concerning for renal carcinoma Anion Gap Metabolic Acidosis P:   - nephrology signing off as no plan to pursue HD with comfort care  - holding off on MRI to better assess renal mass based upon [place] discussion with family  GASTROINTESTINAL A:   Extubated in ICU Moderate, Non-severe Protein Calorie Malnutrition P:   - stop Famotidine 20mg  per tube Q24H - no longer on tube feeds  HEMATOLOGIC/ONC A:   Liver lesion, likely hemangioma Right renal mass RUL lung nodule Thrombocytopenia  DVT PPx P:  - RUL lung nodule, will not pursue biopsy - DVT PPx > stop Heparin  INFECTIOUS A:   Leukoctyosis - stable Fevers - afebrile last 24hours P:   - final blood culture negative - tracheal aspirate culture with no organisms on Gram stain, normal oropharyngeal flora - observing off antibiotics - WOC for chronic pressure ulcer  ENDOCRINE A:   Insulin dependent DM2  Hypoglycemia - resolved after stopping Lantus P:   - CBG Q4H - stop SSI  NEUROLOGIC A:   New onset seizures, unclear etiology Status epilepticus Severe anoxia per MRI/EEG Fevers - as above may be neurologically mediated P:  - Neurology consult appreciated -  AED's per neurology - currently on Vimpat, Keppra, Valproate, Topamax.  Continuing for now. - Intensive EEG with moderate to severe encephalopathy.  Poor prognosis for meaningful neurologic recovery.  Now on comfort care. - MRI brain results as above - RASS goal: -1 - Palliative consult appreciated, comfort care per palliative - Discontinue non-comfort medications.   Transfer to [facility], back to [facility].  FAMILY  - Updates: Wife -  [name] can be reached at [phone] or [name] [phone]   Pulmonary and [place] Pager: [phone]  09/18/2015, 11:08 AM

## 2015-09-18 NOTE — Significant Event (Signed)
Patient transferred safely to 6N08, traveled via bed, no personal belongings at bedside of 92M that could be taken to new room. Report given to receiving RN Elon Jester. Napoleon Monacelli, Therapist, sports

## 2015-09-18 NOTE — Progress Notes (Signed)
Subjective: Extubated, still encephalopathic  Exam: Filed Vitals:   09/18/15 0600 09/18/15 0700  BP: 98/57 113/60  Pulse: 109 101  Temp:    Resp: 27 25   Gen: In bed, NAD Resp: non-labored breathing, no acute distress Abd: soft, nt  Neuro: MS: responds to minor stimulation by turning head, does not follow commands.  CN: left pupil fixed(prosthetic), right reactive.  Motor: withdraws all 4 ext  Impression: 60 yo M with persistent encephalopathy following prolonged status epielpticus. I do suspect that he has had some cerebral injury due to his seizures, but always difficult to predict long term outcome in situations like this with absolute certainty. He doeshave a poor prodrome and I suspect he is much more likley to do poorly than well especially given continued lack of progress. I agree that if family wants to pursue comfort care that this is very reasonable.   Recommendations: 1) Continue AEDs. If full comfort care is pursued, would change to PO if able to take it, if not then could use sublingual ativan if needed.  2) Please call if we can be of any further assistance.   Roland Rack, MD [facility] [phone]  If 7pm- 7am, please page neurology on call as listed in [place].

## 2015-09-18 NOTE — Progress Notes (Signed)
REcieved  Patient to room 6N8, patient is responsive to pain stimuli, not in any distress, lines intact. Family in during transfer. Oriented to unit set-up. Will monitor.

## 2015-09-19 DIAGNOSIS — Z515 Encounter for palliative care: Secondary | ICD-10-CM | POA: Insufficient documentation

## 2015-09-19 DIAGNOSIS — R509 Fever, unspecified: Secondary | ICD-10-CM | POA: Insufficient documentation

## 2015-09-19 DIAGNOSIS — R0682 Tachypnea, not elsewhere classified: Secondary | ICD-10-CM | POA: Insufficient documentation

## 2015-09-19 MED ORDER — ACETAMINOPHEN 10 MG/ML IV SOLN
1000.0000 mg | Freq: Once | INTRAVENOUS | Status: AC
  Administered 2015-09-19: 1000 mg via INTRAVENOUS
  Filled 2015-09-19: qty 100

## 2015-09-19 MED ORDER — SODIUM CHLORIDE 0.9% FLUSH
10.0000 mL | INTRAVENOUS | Status: DC | PRN
  Administered 2015-09-19: 10 mL
  Filled 2015-09-19: qty 40

## 2015-09-19 MED ORDER — FENTANYL CITRATE (PF) 100 MCG/2ML IJ SOLN
25.0000 ug | INTRAMUSCULAR | Status: DC
  Administered 2015-09-19 – 2015-09-20 (×6): 25 ug via INTRAVENOUS
  Filled 2015-09-19 (×7): qty 2

## 2015-09-19 MED ORDER — GLYCOPYRROLATE 0.2 MG/ML IJ SOLN
0.4000 mg | Freq: Three times a day (TID) | INTRAMUSCULAR | Status: DC
  Administered 2015-09-19 – 2015-09-20 (×3): 0.4 mg via INTRAVENOUS
  Filled 2015-09-19 (×3): qty 2

## 2015-09-19 NOTE — Progress Notes (Signed)
PULMONARY / CRITICAL CARE MEDICINE   Name: John [name] MRN: [id number] DOB: [birth year]    ADMISSION DATE:  10/04/2015 CONSULTATION DATE:  09/10/2015  REFERRING MD:  Family Med Teaching Service  CHIEF COMPLAINT:  Acute encephalopathy/Seizures  HISTORY OF PRESENT ILLNESS:   Mr. Factor is a 60 year old man with a past medical history significant for ESRD on HD TTS, Insulin Dependent Type 2 DM, PAD s/p left BKA, COPD, CKD Stage III, Stroke/TIAs presented with lethargy and acute encephalopathy. Patient missed Thursday and Saturday dialysis sessions (prior notes say transportation issues, family reports clotting at dialysis). Did receive 2 hours of dialysis today but ended early due to clotting off. Family reports first noticing a change in mental status yesterday with no improvement after dialysis today. Patient was seen in [facility] today and was able to converse with them and answer questions but was very confused. He was sent for direct admission from clinic. He had a brief seizure while in CT scan on arrival with another seizure once he arrived to the floor. Did not receive any medications as the seizures lasted <1 minute. Mental status has deteriorated and patient is now somnolent and only responds to painful stimuli. No history of EOTH use or cirrhosis.   Found to have new RUL lung nodule with a large right lobe liver lesion. CT of his head shows old infarcts. Patient's mental status has not improved following the seizures and PCCM was called to evaluate the patient.  SUBJECTIVE:  NSC  VITAL SIGNS: BP 119/50 mmHg  Pulse 111  Temp(Src) 101.1 F (38.4 C) (Oral)  Resp 20  Ht 5\' 8"  (1.727 m)  Wt 163 lb 8 oz (74.163 kg)  BMI 24.87 kg/m2  SpO2 100%  HEMODYNAMICS:    VENTILATOR SETTINGS:    INTAKE / OUTPUT: I/O last 3 completed shifts: In: 35 [I.V.:255; IV Piggyback:252] Out: 694 [Urine:94; Stool:600]  PHYSICAL EXAMINATION: General:  Ill-appearing man, lying in bed, responds  some to voice, not following commands Neuro:  Seizure activity not noted HEENT:  Tongue slightly protruded, prosthetic LEFT eye, opens RIGHT eye Cardiovascular:  RRR Lungs:  Mildly increased work of breathing Abdomen:  Soft, no obvious tenderness, no distention  GI/GU: rectal tube and Foley catheter in place Skin:  Left BKA, anterior left knee with unstageable 4x2 cm scabbed wound with no odor/drainage.  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  Present prior to admission.  LABS:  BMET  Recent Labs Lab 09/15/15 0242 09/16/15 0500 09/17/15 0651  NA 135 138 140  K 3.0* 3.4* 3.5  CL 95* 102 102  CO2 23 23 21*  BUN 67* 39* 65*  CREATININE 7.20* 4.95* 7.36*  GLUCOSE 104* 89 87    Electrolytes  Recent Labs Lab 09/15/15 0242 09/16/15 0500 09/17/15 0651  CALCIUM 7.9* 8.2* 8.4*  MG 2.1 2.1 2.2  PHOS 6.1* 3.4 5.6*    CBC  Recent Labs Lab 09/15/15 0242 09/16/15 0500 09/17/15 0651  WBC 10.6* 11.0* 11.3*  HGB 10.4* 10.7* 9.7*  HCT 34.8* 35.4* 32.8*  PLT 105* 105* 101*    Coag's  Recent Labs Lab 09/12/15 1029 09/16/15 0500  INR 1.28 1.18    Sepsis Markers  Recent Labs Lab 09/12/15 1029  LATICACIDVEN 1.1    ABG No results for input(s): PHART, PCO2ART, PO2ART in the last 168 hours.  Liver Enzymes  Recent Labs Lab 09/12/15 1029  09/14/15 0532 09/16/15 0500 09/17/15 0651  AST 17  --   --  48*  --   ALT 10*  --   --  20  --   ALKPHOS 90  --   --  68  --   BILITOT 0.9  --   --  0.6  --   ALBUMIN 3.6  < > 2.9* 2.8* 2.7*  < > = values in this interval not displayed.  Cardiac Enzymes No results for input(s): TROPONINI, PROBNP in the last 168 hours.  Glucose  Recent Labs Lab 09/16/15 1614 09/16/15 2026 09/16/15 2336 09/17/15 0434 09/17/15 0745 09/17/15 1132  GLUCAP 105* 142* 71 94 87 126*    Imaging No results found.   STUDIES:  04/04 > CXR with new RUL pulmonary nodule  04/04 > 2.4 x 2.3 x 2.0 cm RUL nodule. 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  04/05 > MRI brain Symmetric abnormal hippocampi signal compatible status epilepticus, and postictal state.  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  Old LEFT thalamus and LEFT basal ganglia lacunar infarcts. Old RIGHT internal capsule infarct. Old tiny cerebellar infarcts. 04/05 > CT abdomen: 1. The dominant right lobe hepatic lesion is consistent with hemangioma and was previously demonstrated to represent hemangioma in 2007. Dimensions appear to have increased with maximum diameter now of 14.5 cm compared to approximately 12 cm previously. There is no evidence by CT of hemorrhagic complication related to the giant Hemangioma.  2. Incidental detection of a solid-appearing right renal mass measuring approximately 2.1 cm and emanating from the lower pole anteriorly. This is concerning for renal carcinoma.   CULTURES: Blood 04/04 > NG Final Urine 04/05 > NG Final Tracheal aspirate 04/05 > no organisms seen on Gram stain, Culture with normal oropharyngeal flora C. Diff antigen positive, toxin negative 04/07   ANTIBIOTICS: Vancomycin 04/04 > 04/07 Zosyn 04/04 > 04/07  LINES/TUBES: CVC 04/04 > HD Cath Right IJ 3/20 > NG/OG Tube 04/04 > Foley 04/04 > Rectal Tube 04/07 >  ASSESSMENT / PLAN: 60 year old man with ESRD presenting with acute encephalopathy found to have a new RUL lung nodule that may be suspicious for metastatic disease.  Also with new onset seizures of unclear etiology.  PULMONARY A: Acute hypoxic hypercarbic respiratory failure likely secondary to AMS/seizure COPD Lung nodule RUL, suspicious for cancer P:   - 1-way extubation  -  comfort care per palliative - currently has fentanyl and ativan q27min as needed  CARDIOVASCULAR A:  History of PAD Hypotension SVT on admission with HR 180 P:  - stop Metoprolol 2.5-5mg  IV Q3H prn  RENAL A:   ESRD on HD TThSat Right renal mass seen on CT concerning for renal carcinoma Anion Gap Metabolic Acidosis P:   - nephrology signing off as no plan to pursue HD with comfort care  -comfort care  GASTROINTESTINAL A:   Extubated in ICU Moderate, Non-severe Protein Calorie Malnutrition P:   - stop Famotidine 20mg  per tube Q24H - no longer on tube feeds  HEMATOLOGIC/ONC A:   Liver lesion, likely hemangioma Right renal mass RUL lung nodule Thrombocytopenia  DVT PPx P:  - RUL lung nodule, will not pursue biopsy - DVT PPx > stop Heparin  INFECTIOUS A:   Leukoctyosis - stable Fevers - afebrile last 24hours P:   -  final blood culture negative - tracheal aspirate culture with no organisms on Gram stain, normal oropharyngeal flora - observing off antibiotics - WOC for chronic pressure ulcer  ENDOCRINE A:   Insulin dependent DM2  Hypoglycemia - resolved after stopping Lantus P:   - CBG Q4H - stop SSI  NEUROLOGIC A:   New onset seizures, unclear etiology Status epilepticus Severe anoxia per MRI/EEG Fevers - as above may be neurologically mediated P:  - Neurology consult appreciated - AED's per neurology - currently on Vimpat, Keppra, Valproate, Topamax.  Continuing for now. - Intensive EEG with moderate to severe encephalopathy.  Poor prognosis for meaningful neurologic recovery.  Now on comfort care. - MRI brain results as above - RASS goal: -1 - Palliative consult appreciated, comfort care per palliative - Discontinue non-comfort medications.   Transfer to [facility], back to [facility]. 4/12 will contact Family Med service. 4-12 spoke to [facility] and they will pick up today.  FAMILY  - Updates: Wife -  [name] can be reached at [phone]  or [name] [phone]   [facility] Darric Plante ACNP Maryanna Shape PCCM Pager [phone] till 3 pm If no answer page [phone] 09/19/2015, 8:50 AM

## 2015-09-19 NOTE — Consult Note (Signed)
   [facility] Inpatient Consult   09/19/2015  John [name] 1955/12/09 [id number]    Patient screened for potential [facility] Care Management services. Chart reviewed. Noted Palliative Medicine Team notes. [facility] Care Management services not appropriate at this time. Will not attempt to engage for [place] Management services. For questions please contact:  Marthenia Rolling, [place], RN,BSN [facility] Liaison [phone]

## 2015-09-19 NOTE — Progress Notes (Signed)
Family Medicine Teaching Service Daily Progress Note Intern Pager: [phone]  Patient name: John [name] Medical record number: [id number] Date of birth: 02-Sep-1955 Age: 60 y.o. Gender: male  Primary Care Provider: [name], MD Consultants: Nephrology, Neurology, CCM, Palliative  Code Status: DNR   Pt Overview and Major Events to Date:  4/4: Patient admitted to [place] but quickly transferred to [facility] for new onset-seizures, CXR with new RUL pulmonary nodule , CT: 2.4 x 2.3 x 2.0 cm RUL nodule. 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 MRI brain Symmetric abnormal hippocampi signal compatible status epilepticus, and postictal state, CT abdomen with solid-appearing right renal mass concerning for carcinoma  4/7: Vancomycin and Zosyn discontinued  4/10: Palliative Extubation performed, comfort care initiated 4/12: Transferred to [place] and Plan: [name] is a 60 year old man with a past medical history significant for ESRD on HD TTS, Insulin Dependent Type 2 DM, PAD s/p left BKA, COPD, CKD Stage III, Stroke/TIAs presented with lethargy and acute encephalopathy.  Encephalopathy: Likely worsened by prolonged status epilepticus. Not responsive during exam today.  -palliative care consulted, appreciate recs, anticipates hospital death  -continue Fentanyl, Ativan, and Versed prn  -palliative has ordered scheduled Fentanyl 25 mcg q2h  Acute Hypoxic Hypercarbic Respiratory Failure:  Respiratory failure likely secondary to AMS/Seizure. Palliative extubation performed on 4/10. Patient with secretions and gurgling present this morning.  -continue supplemental O2 by [place]  -robinul for secretions TID   New Onset Seizures: Shortly after hospital admission. Likely  secondary to uremia from missed HD. Neurology has followed during hospitalization. Prolonged status epilepticus at beginning of hospital course. Intensive EEG with moderate to severe encephalopathy.  -continue Vimpat, Keppra, and Valproate  Nodules Suspicious for Malignancy: 2.4 x 2.3 x 2.0 cm RUL lung nodule. Right renal mass seen on CT concerning for renal carcinoma.  -holding off on MRI to better assess renal mass based upon [place] discussion with family -will not pursue biopsy of RUL lung nodule   ESRD: Patient initially received HD during hospital course. Wife is currently debating whether to resume or permanently d/c HD. Would recommend not pursuing HD as patient in active process of dying.  -nephrology has singed off  Moderate, Non-Severe Protein Calorie Malnutrition: Famotidine and tube feeds discontinued by CCM per comfort care protocol.   Chronic Pressure Ulcer: Popliteal area of left leg with stage 4 pressure injury present prior to admission.  -continue Santyl for chemical debridement of nonviable tissue per [facility] recs  -foam dressing change q5 days or prn soiling   T2DM: Insulin and CBG monitoring discontinued by CCM prior to transfer to [facility] per comfort care protocol..   FEN/GI: KVO, NPO  PPx: No DVT prophylaxis given comfort care status.   Disposition: Comfort Care   Subjective:  Patient unresponsive to verbal or noxious stimuli this morning. Family not present at bedside. PCP Nori Riis) called this AM with updates. She plans to come to hospital at lunchtime.   Objective: Temp:  [98.1 F (36.7 C)-101.1 F (38.4 C)] 101.1 F (38.4 C) (04/12 0523) Pulse Rate:  [94-111] 111 (04/12 0523) Resp:  [17-28] 20 (04/12 0523) BP: (93-120)/(48-66) 119/50 mmHg (04/12 0523) SpO2:  [90 %-100 %] 100 % (04/12 0523) Weight:  [163 lb 8 oz (74.163 kg)] 163 lb 8  oz (74.163 kg) (04/12 0523) Physical Exam: General: Chronically ill appearing male lying in bed. Did not respond to me calling his  name.  HEENT: Tongue slightly protruded.  Cardiovascular: Tachycardic. II/VI systolic murmur appreciated.  Respiratory: Increased WOB. Gurgling. Billings in place  Abdomen: soft, NTND  Extremities: Left BKA with foam dressing in place.   Laboratory:  Recent Labs Lab 09/15/15 0242 09/16/15 0500 09/17/15 0651  WBC 10.6* 11.0* 11.3*  HGB 10.4* 10.7* 9.7*  HCT 34.8* 35.4* 32.8*  PLT 105* 105* 101*    Recent Labs Lab 09/12/15 1029  09/15/15 0242 09/16/15 0500 09/17/15 0651  NA  --   < > 135 138 140  K  --   < > 3.0* 3.4* 3.5  CL  --   < > 95* 102 102  CO2  --   < > 23 23 21*  BUN  --   < > 67* 39* 65*  CREATININE  --   < > 7.20* 4.95* 7.36*  CALCIUM  --   < > 7.9* 8.2* 8.4*  PROT 7.0  --   --  6.2*  --   BILITOT 0.9  --   --  0.6  --   ALKPHOS 90  --   --  68  --   ALT 10*  --   --  20  --   AST 17  --   --  48*  --   GLUCOSE  --   < > 104* 89 87  < > = values in this interval not displayed.    Imaging/Diagnostic Tests: Dg Chest 1 View  09/08/2015  CLINICAL DATA:  Acute encephalopathy today. Headache and possible seizure today. EXAM: CHEST 1 VIEW COMPARISON:  CT chest 09/10/2015. Single view of the chest 08/27/2015. FINDINGS: Dialysis catheter remains in place. Lung volumes are low with mild basilar atelectasis. Nodule in the right upper lobe is identified as seen on the prior exams. Cardiac silhouette is accentuated by low lung volumes. IMPRESSION: No acute disease. Right upper lobe pulmonary nodule seen on prior exams. Electronically Signed   By: Inge Rise M.D.   On: 10/05/2015 19:06   Ct Head Wo Contrast  09/22/2015  CLINICAL DATA:  60 year old presenting with acute encephalopathy. EXAM: CT HEAD WITHOUT CONTRAST TECHNIQUE: Contiguous axial images were obtained from the base of the skull through the vertex without intravenous contrast. COMPARISON:  None. FINDINGS: Motion blurred several of the images but the study appears diagnostic. Ventricular system normal in size and  appearance for age. No significant atrophy for age. Low attenuation in the right basal ganglia localizing to the anterior limb of the internal capsule. Mild to moderate changes of small vessel disease of the white matter diffusely. No mass lesion. No midline shift. No acute hemorrhage or hematoma. No extra-axial fluid collections. No skull fracture or other focal osseous abnormality involving the skull. Visualized paranasal sinuses, bilateral mastoid air cells and bilateral middle ear cavities well-aerated. Severe bilateral carotid siphon and vertebral artery atherosclerosis. Left globe prosthesis. IMPRESSION: 1. Age indeterminate nonhemorrhagic lacunar stroke involving the right basal ganglia localizing to the anterior limb of the right internal capsule. 2. No acute intracranial abnormality otherwise. 3. Mild to moderate chronic microvascular ischemic changes of the white matter. 4. Severe bilateral carotid siphon and vertebral artery atherosclerosis. I reviewed these images with Dr. Rolla Flatten, neuroradiologist, who agrees with this interpretation. Electronically Signed   By: Evangeline Dakin M.D.   On: 09/10/2015 19:30   Ct Chest Wo  Contrast  09/10/2015  CLINICAL DATA:  Pulmonary nodule EXAM: CT CHEST WITHOUT CONTRAST TECHNIQUE: Multidetector CT imaging of the chest was performed following the standard protocol without IV contrast. COMPARISON:  Chest radiograph August 27, 2015 FINDINGS: Mediastinum/Lymph Nodes: Thyroid appears normal. There are scattered small mediastinal lymph nodes, most of which do not meet size criteria for pathologic significance. There is a lymph node just anterior to the carina measuring 1.4 x 0.9 cm. A second nearby lymph node in this region measures 1.1 x 1.0 cm. There is a central catheter with its tip in the right atrium. There are multiple foci of coronary artery calcification. Pericardium is not thickened. There is no thoracic aortic aneurysm. There is moderate calcification at the  origins of the great vessels. There is moderate calcifications scattered throughout the aorta. Lungs/Pleura: There is a lobulated nodular lesion in the posterior segment of the right upper lobe measuring 2.4 x 2.4 x 2.0 cm. On axial slice 71 series 4, there is a 2 mm nodular opacity abutting the pleura in the inferior lingula. Lungs elsewhere clear. Note that there is slight lower lobe bronchiectatic change bilaterally. Upper abdomen: There is a lesion occupying most of the right lobe of the liver, incompletely visualized, measuring 15.8 x 12.2 cm in its visualized portions. There are areas suggesting stellate scarring within this lesion. Elsewhere in the visualized upper abdomen, there is incomplete visualization of a cyst arising from the upper to mid left kidney measuring 2.1 x 2.0 cm. There is extensive calcification in the splenic artery. Musculoskeletal: There is a right axillofemoral graft, incompletely visualized. There are no blastic or lytic bone lesions. IMPRESSION: Nodular lesion posterior segment right upper lobe measuring 2.4 x 2.4 x 2.0 cm. Mildly prominent lymph nodes anterior to the carina. Other smaller lymph nodes seen on this noncontrast enhanced study. Neoplasm is concern. These findings may warrant PET-CT to further assess. Large lesion occupying much of the right lobe of the liver. Features on this noncontrast enhanced study suggests potential focal nodular hyperplasia. MR pre and post-contrast would be the imaging study of choice to optimally assess this lesion. A neoplastic focus in the liver, particularly given the changes in the chest, must be of concern. Multiple foci of coronary artery calcification. These results will be called to the ordering clinician or representative by the Radiologist Assistant, and communication documented in the PACS or zVision Dashboard. Electronically Signed   By: Lowella Grip III M.D.   On: 09/10/2015 15:55   Mr Brain Wo Contrast  09/14/2015  ADDENDUM  REPORT: 09/14/2015 00:44 ADDENDUM: Subacute RIGHT posterior temporal lobe subcentimeter infarct. Electronically Signed   By: Elon Alas M.D.   On: 09/14/2015 00:44  09/14/2015  CLINICAL DATA:  Status epilepticus, postictal encephalopathy. History of posterior fossa stroke and LEFT vertebral artery occlusion. History of seizures, end-stage renal disease on dialysis, hypertension, hyperlipidemia, diabetes, stroke. EXAM: MRI HEAD WITHOUT CONTRAST TECHNIQUE: Multiplanar, multiecho pulse sequences of the brain and surrounding structures were obtained without intravenous contrast. COMPARISON:  CT head September 11, 2015 FINDINGS: The ventricles and sulci are normal for patient's age. No mass lesions, mass effect. 1 cm reduced diffusion RIGHT posterior temporal lobe, normalized ADC values and bright FLAIR T2 hyperintense signal. Symmetric T2 bright FLAIR signal within the hippocampi bilaterally, corresponding reduced diffusion. No susceptibility artifact to suggest hemorrhage. Patchy T2 bright FLAIR signal within the bilateral cerebellum, in the periphery of the temporal lobes, periphery of the frontal parietal lobes. Old LEFT thalamus lacunar infarct. Old LEFT  basal ganglia lacunar infarcts. Old LEFT anterior limb of the internal capsule infarct. Old tiny bilateral cerebellar infarcts. No abnormal extra-axial fluid collections. No extra-axial masses though, contrast enhanced sequences would be more sensitive. Normal major intracranial vascular flow voids seen at the skull base. Status post LEFT ocular globe prosthesis. No abnormal sellar expansion. No suspicious calvarial bone marrow signal. Craniocervical junction maintained. Status post upper cervical ACDF, incompletely evaluated. Trace paranasal sinus mucosal thickening and mastoid effusions, life-support lines in place. IMPRESSION: Symmetric abnormal hippocampi signal compatible status epilepticus, and postictal state. 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 Old LEFT thalamus and LEFT basal ganglia lacunar infarcts. Old RIGHT internal capsule infarct. Old tiny cerebellar infarcts. These results will be called to the ordering clinician or representative by the Radiologist Assistant, and communication documented in the PACS or zVision Dashboard. Electronically Signed: By: Elon Alas M.D. On: 09/13/2015 23:54   Ct Abdomen Pelvis W Contrast  09/13/2015  CLINICAL DATA:  Liver lesions seen by chest CT. Previous documentation of large hepatic hemangioma. EXAM: CT ABDOMEN AND PELVIS WITH CONTRAST TECHNIQUE: Multidetector CT imaging of the abdomen and pelvis was performed using the standard protocol following bolus administration of intravenous contrast. CONTRAST:  100 mL ISOVUE-300 IOPAMIDOL (ISOVUE-300) INJECTION 61% COMPARISON:  CT of the abdomen and pelvis on 10/19/2005 FINDINGS: Lower chest:  Atelectasis at the posterior left lung base. Hepatobiliary: The large lesion occupying much of the right lobe of the liver shows globular peripheral enhancement with further increased central enhancement on delayed imaging. This is consistent with a large hemangioma and causes superior capsular bulging into the right hemidiaphragm. Maximal dimensions are approximately 14.5 cm. This appears to have enlarged since prior CT in 2007 at which time maximum diameter was approximately 12 cm. No evidence to suggest active bleeding. The rest of the liver appears unremarkable. The gallbladder may contain a punctate calculus. No biliary ductal dilatation. Pancreas: No mass, inflammatory changes, or other significant abnormality. Spleen: Within normal limits in size and appearance. Adrenals/Urinary Tract: There is a solid-appearing mass at the anterior right lower kidney measuring approximately 1.7 x 2.1 cm on the delayed phase. This mass is largely endophytic and has a minimal  exophytic component. Density measurement on the venous phase is consistent with solid tissue or hemorrhagic content. Further evaluation with MRI of would be warranted as this is concerning for renal carcinoma. Additional lower pole cyst just inferior to the solid lesion appears benign and measures approximately 1.3 cm. There is a benign appearing cyst of the superior left kidney measuring 2.4 cm. No hydronephrosis. The bladder is decompressed by a Foley catheter. Stomach/Bowel: No evidence of obstruction, inflammatory process, or abnormal fluid collections. Rectal tube present. No evidence of free air or abscess. Vascular/Lymphatic: No pathologically enlarged lymph nodes. No evidence of abdominal aortic aneurysm. Reproductive: No mass or other significant abnormality. Other: Right-sided subcutaneous axillo-femoral bypass graft is identified with open anastomosis to the right femoral artery. There also appears to be extension of a femoral bypass graft that extends below the inguinal ligament. Musculoskeletal:  Bony structures are unremarkable. IMPRESSION: 1. The dominant right lobe hepatic lesion is consistent with hemangioma and was previously demonstrated to represent hemangioma in 2007. Dimensions appear to have increased with maximum diameter now of 14.5 cm compared to approximately 12 cm previously. There is no evidence by CT of hemorrhagic complication related to the giant hemangioma. 2. Incidental detection of a solid-appearing  right renal mass measuring approximately 2.1 cm and emanating from the lower pole anteriorly. This is concerning for renal carcinoma. Further evaluation with MRI may be helpful to characterize the lesion and its enhancement pattern. 3. Potential tiny gallstone in the gallbladder. 4. Patent right-sided axillo femoral bypass graft. Electronically Signed   By: Aletta Edouard M.D.   On: 09/13/2015 23:55   Dg Chest Port 1 View  09/17/2015  CLINICAL DATA:  Endotracheal tube intubated.  EXAM: PORTABLE CHEST 1 VIEW COMPARISON:  For a 2017 FINDINGS: Endotracheal tube remains in good position. Right jugular dialysis catheter in the mid right atrium unchanged. Left jugular central venous catheter tip in the proximal SVC unchanged. NG tube in the proximal stomach. Side hole in the distal esophagus. Lungs are well aerated without infiltrate or effusion. No significant collapse Right upper lobe nodule noted. IMPRESSION: Support lines remain in good position.  Lungs remain clear. Right upper lobe nodule unchanged. Electronically Signed   By: Franchot Gallo M.D.   On: 09/17/2015 07:06   Dg Chest Port 1 View  09/15/2015  CLINICAL DATA:  60 year old male with a history of COPD hypertension and diabetes. Admission 09/11/2002 with acute encephalopathy in seizures. History of right ax fem bypass EXAM: PORTABLE CHEST 1 VIEW COMPARISON:  Multiple prior chest x-ray most recent 09/14/2015. CT chest 09/10/2015 FINDINGS: Cardiomediastinal silhouette unchanged in size and contour. No evidence of central vascular congestion. Coarsened interstitial markings again noted. Similar appearance of soft tissue nodule right chest, better characterized on prior CT. Unchanged position of right IJ approach hemodialysis catheter, terminating in these upper right atrium. Unchanged position of left IJ approach central venous catheter appearing to terminate superior vena cava. Unchanged position of endotracheal tube, terminating suitably above the carina measuring approximately 4 cm. Unchanged gastric tube. Surgical changes of the cervical region. Calcifications of the carotid vasculature. IMPRESSION: Similar appearance of the chest x-ray to the prior with unchanged support apparatus, as above. Unchanged aeration of the lungs, without new airspace disease. No pneumothorax. Right lung nodule better characterized on prior CT. Signed, Dulcy Fanny. Earleen Newport, DO [facility]  Electronically Signed   By: Corrie Mckusick D.O.   On: 09/15/2015 07:49   Dg Chest Port 1 View  09/14/2015  CLINICAL DATA:  Intubation. EXAM: PORTABLE CHEST 1 VIEW COMPARISON:  09/13/2015. FINDINGS: Endotracheal tube, NG tube, bilateral IJ lines in unchanged position. Right IJ line dual-lumen catheter tip is in the right atrium. Heart size normal. Unchanged right upper lung nodular density. Lung volumes with bibasilar atelectasis. No pleural effusion pneumothorax . IMPRESSION: 1. Lines and tubes in stable position. 2.  Lung volumes with stable bibasilar atelectasis. 3.  Unchanged right upper lobe nodular density. Electronically Signed   By: Marcello Moores  Register   On: 09/14/2015 07:10   Dg Chest Port 1 View  09/13/2015  CLINICAL DATA:  Respiratory failure. EXAM: PORTABLE CHEST 1 VIEW COMPARISON:  09/12/2015. FINDINGS: Endotracheal tube, NG tube, dual-lumen right IJ, left IJ catheter in stable position. Heart size normal. Low lung volumes with mild basilar atelectasis. No pleural effusion or pneumothorax. IMPRESSION: 1. Lines and tubes in stable position. 2. Low lung volumes with mild bibasilar atelectasis and/or infiltrate. 3. Persistent right upper lobe nodular lesion, unchanged. Electronically Signed   By: Marcello Moores  Register   On: 09/13/2015 07:21   Portable Chest Xray  09/12/2015  CLINICAL DATA:  Hypoxia EXAM: PORTABLE CHEST 1 VIEW COMPARISON:  September 11, 2015 FINDINGS: Endotracheal tube tip is 4.7 cm  above the carina. Nasogastric tube tip and side port are below the diaphragm. Dual-lumen central catheter tip is in the right atrium. Left jugular catheter tip is in the left innominate vein near the junction with the superior vena cava. No pneumothorax. There is no edema or consolidation. The previously noted nodular lesion in the right upper lobe is again noted, measuring 2.4 x 2.2 cm. The heart size and pulmonary vascularity are normal. No adenopathy. No demonstrable bone lesions. IMPRESSION: Tube and catheter  positions as described without pneumothorax. Stable right upper lobe nodular lesion. No edema or consolidation. Electronically Signed   By: Lowella Grip III M.D.   On: 09/12/2015 07:00   Dg Chest Port 1 View  09/26/2015  CLINICAL DATA:  Respiratory distress. New left internal jugular catheter and orogastric tube placement. EXAM: PORTABLE CHEST 1 VIEW COMPARISON:  Radiograph earlier this day at [place] hour, chest CT 09/10/2015 FINDINGS: Endotracheal tube 4.5 cm from the carina. Enteric tube in place, tip and side port below the diaphragm. Left internal jugular tip in the region of the proximal SVC/ distal brachiocephalic vein. No pneumothorax. Right-sided dialysis catheter tips at the atrial caval junction/right atrium. Mild cardiomegaly and mediastinal contours are unchanged. No pulmonary edema. Right lung nodule again seen. No new airspace disease. No large pleural effusion. IMPRESSION: 1. Endotracheal and enteric tubes in place. 2. Tip of the left internal jugular central line in the region the proximal SVC/distal brachiocephalic vein. No pneumothorax. Electronically Signed   By: Jeb Levering M.D.   On: 09/14/2015 23:42    Nicolette Bang, DO 09/19/2015, 8:10 AM PGY-1, [place] Intern pager: [phone], text pages welcome

## 2015-09-19 NOTE — Progress Notes (Signed)
Daily Progress Note   Patient Name: John [name]       Date: 09/19/2015 DOB: 04-01-1956  Age: 60 y.o. MRN#: [id number] Attending Physician: Blane Ohara McDiarmid, MD Primary Care Physician: [name], MD Admit Date: 10/04/2015  Reason for Consultation/Follow-up: Terminal Care  Subjective: Patient unable  Interval Events:  Patient appears to have taken a turn for the worse.  He has increased work of breathing, with gurgling noted, slightly diaphoretic, elevated temp (101.0)  Appears to be actively starting the dying process.  Unfortunately I will likely not see Mrs. Tamala Julian today as I am attending CME starting at 12:00 today.  I called her and left a voice mail message that his condition had changed and that he was worse this morning.  I left her my cell phone number to call if she needed to do so.  As Mr. Peoples appeared uncomfortable and with increased work of breathing.  I ordered low dose scheduled fentanyl as well as scheduled robinul for secretions/gurgling.  He also would benefit from mouth care.   Length of Stay: 8 days  Current Medications: Scheduled Meds:  . collagenase   Topical Daily  . fentaNYL (SUBLIMAZE) injection  25 mcg Intravenous Q2H  . glycopyrrolate  0.4 mg Intravenous TID  . lacosamide (VIMPAT) IV  100 mg Intravenous Q T,Th,Sa-HD  . lacosamide (VIMPAT) IV  200 mg Intravenous Q12H  . levETIRAcetam  1,000 mg Intravenous Q24H  . levETIRAcetam  500 mg Intravenous Q T,Th,Sa-HD  . sodium chloride  250 mL Intravenous Once  . valproate sodium  400 mg Intravenous 4 times per day    Continuous Infusions: . sodium chloride 10 mL/hr at 09/17/15 2000    PRN Meds: acetaminophen (TYLENOL) oral liquid 160 mg/5 mL, albuterol, alteplase, fentaNYL (SUBLIMAZE) injection,  glycopyrrolate, ipratropium-albuterol, lidocaine (PF), lidocaine-prilocaine, LORazepam, midazolam, pentafluoroprop-tetrafluoroeth, sodium chloride flush   Physical Exam      Wd chronically ill appearing male lying in bed diaphoretic.  He opens his eyes when I call his name. CV: Tachycardic Resp:    Increased work of breathing with tachypnea and gurgling Extremities:  Trace edema noted in upper extremities.   Warm to touch.      Vital Signs: BP 119/50 mmHg  Pulse 111  Temp(Src) 101.1 F (38.4 C) (Oral)  Resp 20  Ht  5\' 8"  (1.727 m)  Wt 74.163 kg (163 lb 8 oz)  BMI 24.87 kg/m2  SpO2 100% SpO2: SpO2: 100 % O2 Device: O2 Device: Nasal Cannula O2 Flow Rate: O2 Flow Rate (L/min): 3 L/min  Intake/output summary:   Intake/Output Summary (Last 24 hours) at 09/19/15 0957 Last data filed at 09/19/15 0524  Gross per 24 hour  Intake    159 ml  Output    510 ml  Net   -351 ml   LBM: Last BM Date: 09/18/15 (flexiseal in place, draining) Baseline Weight: Weight: 73 kg (160 lb 15 oz) Most recent weight: Weight: 74.163 kg (163 lb 8 oz)       Palliative Assessment/Data: Flowsheet Rows        Most Recent Value   Intake Tab    Referral Department  Critical care   Unit at Time of Referral  ICU   Palliative Care Primary Diagnosis  Neurology   Date Notified  09/15/15   Palliative Care Type  New Palliative care   Reason for referral  Clarify Goals of Care   Date of Admission  10/06/2015   Date first seen by Palliative Care  09/16/15   # of days Palliative referral response time  1 Day(s)   # of days IP prior to Palliative referral  4   Clinical Assessment    Palliative Performance Scale Score  10%   Pain Max last 24 hours  Not able to report   Pain Min Last 24 hours  Not able to report   Dyspnea Max Last 24 Hours  Not able to report   Dyspnea Min Last 24 hours  Not able to report   Nausea Max Last 24 Hours  Not able to report   Nausea Min Last 24 Hours  Not able to report   Anxiety Max  Last 24 Hours  Not able to report   Anxiety Min Last 24 Hours  Not able to report   Other Max Last 24 Hours  Not able to report   Psychosocial & Spiritual Assessment    Palliative Care Outcomes    Patient/Family meeting held?  Yes   Who was at the meeting?  wife, pt's brotherr, sister, her dtr, grandchildren and neurologist   Palliative Care Outcomes  Clarified goals of care   Patient/Family wishes: Interventions discontinued/not started   [place] follow-up planned  Yes, Facility      Additional Data Reviewed: CBC    Component Value Date/Time   WBC 11.3* 09/17/2015 0651   RBC 3.55* 09/17/2015 0651   HGB 9.7* 09/17/2015 0651   HGB 10.8* 06/23/2013 1122   HCT 32.8* 09/17/2015 0651   PLT 101* 09/17/2015 0651   MCV 92.4 09/17/2015 0651   MCH 27.3 09/17/2015 0651   MCHC 29.6* 09/17/2015 0651   RDW 17.3* 09/17/2015 0651   LYMPHSABS 1.3 09/12/2015 0438   MONOABS 1.5* 09/12/2015 0438   EOSABS 0.0 09/12/2015 0438   BASOSABS 0.0 09/12/2015 0438    CMP     Component Value Date/Time   NA 140 09/17/2015 0651   NA 145* 01/30/2014 1033   K 3.5 09/17/2015 0651   CL 102 09/17/2015 0651   CO2 21* 09/17/2015 0651   GLUCOSE 87 09/17/2015 0651   GLUCOSE 84 01/30/2014 1033   BUN 65* 09/17/2015 0651   BUN 39* 01/30/2014 1033   CREATININE 7.36* 09/17/2015 0651   CREATININE 5.14* 09/21/2013 1014   CALCIUM 8.4* 09/17/2015 0651   CALCIUM  7.9* 06/29/2012 1345   PROT 6.2* 09/16/2015 0500   PROT 6.3 01/30/2014 1033   ALBUMIN 2.7* 09/17/2015 0651   ALBUMIN 4.3 01/30/2014 1033   AST 48* 09/16/2015 0500   ALT 20 09/16/2015 0500   ALKPHOS 68 09/16/2015 0500   BILITOT 0.6 09/16/2015 0500   GFRNONAA 7* 09/17/2015 0651   GFRAA 8* 09/17/2015 0651       Problem List:  Patient Active Problem List   Diagnosis Date Noted  . Goals of care, counseling/discussion   . Terminal care   . Encounter for hospice care discussion   . Nonintractable epilepsy without status epilepticus  ([place])   . Chronic kidney disease (CKD), stage IV (severe) ([place])   . Palliative care encounter   . Seizures ([place])   . Malnutrition of moderate degree 09/13/2015  . Status epilepticus ([place])   . Acute respiratory failure with hypoxemia ([place])   . Liver lesion   . Acute encephalopathy 09/15/2015  . Leg ulcer ([place]) 08/09/2015  . 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 Dysesthesia   . Anemia of chronic disease   . Abnormality of gait   . ESRD on dialysis ([place])   . Type 2 diabetes mellitus with complication, with long-term current use of insulin ([place])   . PAD (peripheral artery disease) ([place])   . Knee gives out 07/03/2014  . Left upper arm pain 03/20/2014  . Encounter for chronic pain management 03/10/2014  . Peripheral vascular disease, unspecified ([place]) 03/08/2014  . Fecal incontinence 03/23/2013  . PVD (peripheral vascular disease) ([place]) 07/28/2012  . hand pain , steal syndrome LEFT 07/28/2012  . Mitral valve regurgitation 07/05/2012  . Hyperparathyroidism, secondary renal ([place]) 07/05/2012  . Anemia of renal disease 07/05/2012  . Cervical myelopathy ([place]) 07/05/2012  . Occlusion and stenosis of carotid artery without mention of cerebral infarction 12/03/2011  . Atherosclerosis of native arteries of the extremities with intermittent claudication 11/12/2011  . Right carotid bruit 11/12/2011  . Knee pain, left 08/08/2011  . COPD (chronic obstructive pulmonary disease) ([place]) 01/11/2011  . Backache 12/15/2008  . ESRD (end stage renal disease) on dialysis ([place]) 01/21/2008  . NEUROPATHY, DIABETIC 08/06/2006  . Diabetes mellitus with renal complications ([place]) [id number]  . HYPERLIPIDEMIA 08/06/2006  . TOBACCO DEPENDENCE 08/06/2006  . HYPERTENSION, BENIGN SYSTEMIC 08/06/2006  . 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 OSTEOARTHRITIS, LOWER LEG 08/06/2006  . History of cardiovascular disorder 08/06/2006     Palliative Care Assessment & Plan    1.Code  Status: DNR   2. Goals of Care/Additional Recommendations:  Patient appear to be actively dying.  Would anticipate hospital death.  Recommend against HD or any invasive treatment at this point in order to preserve comfort and dignity.  Desire for further Chaplaincy support: Yes, [employer].  Psycho-social Needs: Grief/Bereavement Support  3. Symptom Management:      1.Discomfort / Dyspnea:  Fentanyl 25 mcg q 2 and PRN       2.  Robinul:  Scheduled and PRN for secreations       3.  Agitation / anxiety / Seizure:  Ativan PRN       4.  Continue all seizure medications.  4. Palliative Prophylaxis:   Aspiration, Delirium Protocol, Frequent Pain Assessment and Palliative Wound Care  5. Prognosis: Hours - Days  6. Discharge Planning:  Anticipated Hospital Death   Care plan was discussed with bedside RN, Baxter Flattery  Thank you for allowing the Palliative Medicine Team to assist in the care  of this patient.   Time In: 8:30 Time Out: 8:55 Total Time 25  Prolonged Time Billed no       Imogene Burn, [place] [facility] Pager: [phone]   09/19/2015, 9:57 AM  Please contact Palliative Medicine Team phone at [phone] for questions and concerns.

## 2015-09-19 NOTE — Progress Notes (Signed)
Continuity Provider  Note: [name] MD Attending pager: [phone]  office [phone] I saw John [name] and his family including his wife, John [name] at bedside today. We had discussed his issues yesterday afternoon with the Palliative Care Team. I appreciate their excellent care as well as [facility] inpatient team.  [name] is terminally ill. His code status is DNR. His wife is at peace with this decision although she is still actively grieving. [name] appears to be comfortable. He has some gurgly airway secretions at times that respond well to suctioning. He is no respiratory distress. He is much less responsive today. His wife has my cell phone number should she need me.

## 2015-09-20 ENCOUNTER — Encounter: Payer: Self-pay | Admitting: Family Medicine

## 2015-09-21 DIAGNOSIS — S98929A Partial traumatic amputation of unspecified foot, level unspecified, initial encounter: Secondary | ICD-10-CM | POA: Diagnosis not present

## 2015-09-21 DIAGNOSIS — I6789 Other cerebrovascular disease: Secondary | ICD-10-CM | POA: Diagnosis not present

## 2015-09-28 ENCOUNTER — Encounter (HOSPITAL_COMMUNITY): Payer: Medicare Other

## 2015-10-03 ENCOUNTER — Ambulatory Visit: Payer: Medicare Other | Admitting: Vascular Surgery

## 2015-10-05 ENCOUNTER — Ambulatory Visit: Payer: Medicare Other | Admitting: Physical Medicine & Rehabilitation

## 2015-10-08 NOTE — Discharge Summary (Signed)
[facility] Death Summary  Patient name: John [name] Medical record number: [id number] Date of birth: [birth year] Age: 60 y.o. Gender: male Date of Admission: 09/16/2015  Date of Death: 09-25-15 Admitting Physician: Blane Ohara McDiarmid, MD  Primary Care Provider: [name], MD Consultants: CCM, Nephrology   Indication for Hospitalization: Encephalopathy   Disposition: Deceased   Brief Hospital Course:  John [name] is 60 y.o. male with PMH of ESRD on HD, T2DM, PAD s/p left BKA, COPD, stroke/TIAs who presented with encephalopathy. Shortly after presentation, while obtaining CT head, patient began to have seizures. Found to have new RUL lung nodule with a large right lobe liver lesion. CT of his head shows old infarcts. Patient's mental status did not improve following the seizures and CCM was consulted. Patient was subsequently intubated and started on tube feeds. He was started on broad spectrum antibiotics for concern for sepsis/infectious process; these were subsequently discontinued by CCM a few days later. Nephrology was consulted during hospitalization and performed HD.   Neurology was consulted regarding seizures. Continuous EEG was performed which was consistent with status epilepticus. Keppra was started. 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 EEG subsequently was consistent with resolution of status epilepticus and improvement in background activities.   Patient's encephalopathy was not improving so palliative care was consulted. Wife made decision to make DNR and began to move more towards comfort care. Eventually, wife made decision for palliative extubation and decided against PEG tube placement. Patient was extubated on 4/10. He was transferred to the floor and to [facility]PTS on 4/12. Patient's mental  and respiratory status continued to decline. Patient was placed on Fentanyl, Ativan and Versed for comfort care measures. Nephrology signed off as further HD would not have been consistent with comfort care decisions.   Time of death was 02:15 on 2022-09-25. He was pronounced deceased by two RNs. Cause of death was likely acute respiratory failure. Wife was notified at time of death.   Significant Procedures: Hemodialysis, Intubation   Nicolette Bang, DO Sep 25, 2015, 7:30 AM PGY-1, [place]

## 2015-10-08 NOTE — Progress Notes (Signed)
DEATH NOTE: I spoke with John [name] this morning. He passed away peacefully in the middle of the nigh while hospitalized.  She was notified at 02:15 AM. [name]

## 2015-10-08 DEATH — deceased

## 2015-11-14 ENCOUNTER — Ambulatory Visit: Payer: Medicare Other | Admitting: Vascular Surgery

## 2015-11-14 ENCOUNTER — Encounter (HOSPITAL_COMMUNITY): Payer: Medicare Other

## 2016-07-10 IMAGING — CR DG CHEST 2V
2 series · 2 of 2 positions shown · non-contrast
Comparison: 06/28/2012.

CLINICAL DATA: Chest pain.  Dialysis patient.

EXAM:
CHEST  2 VIEW

[w chest pa]
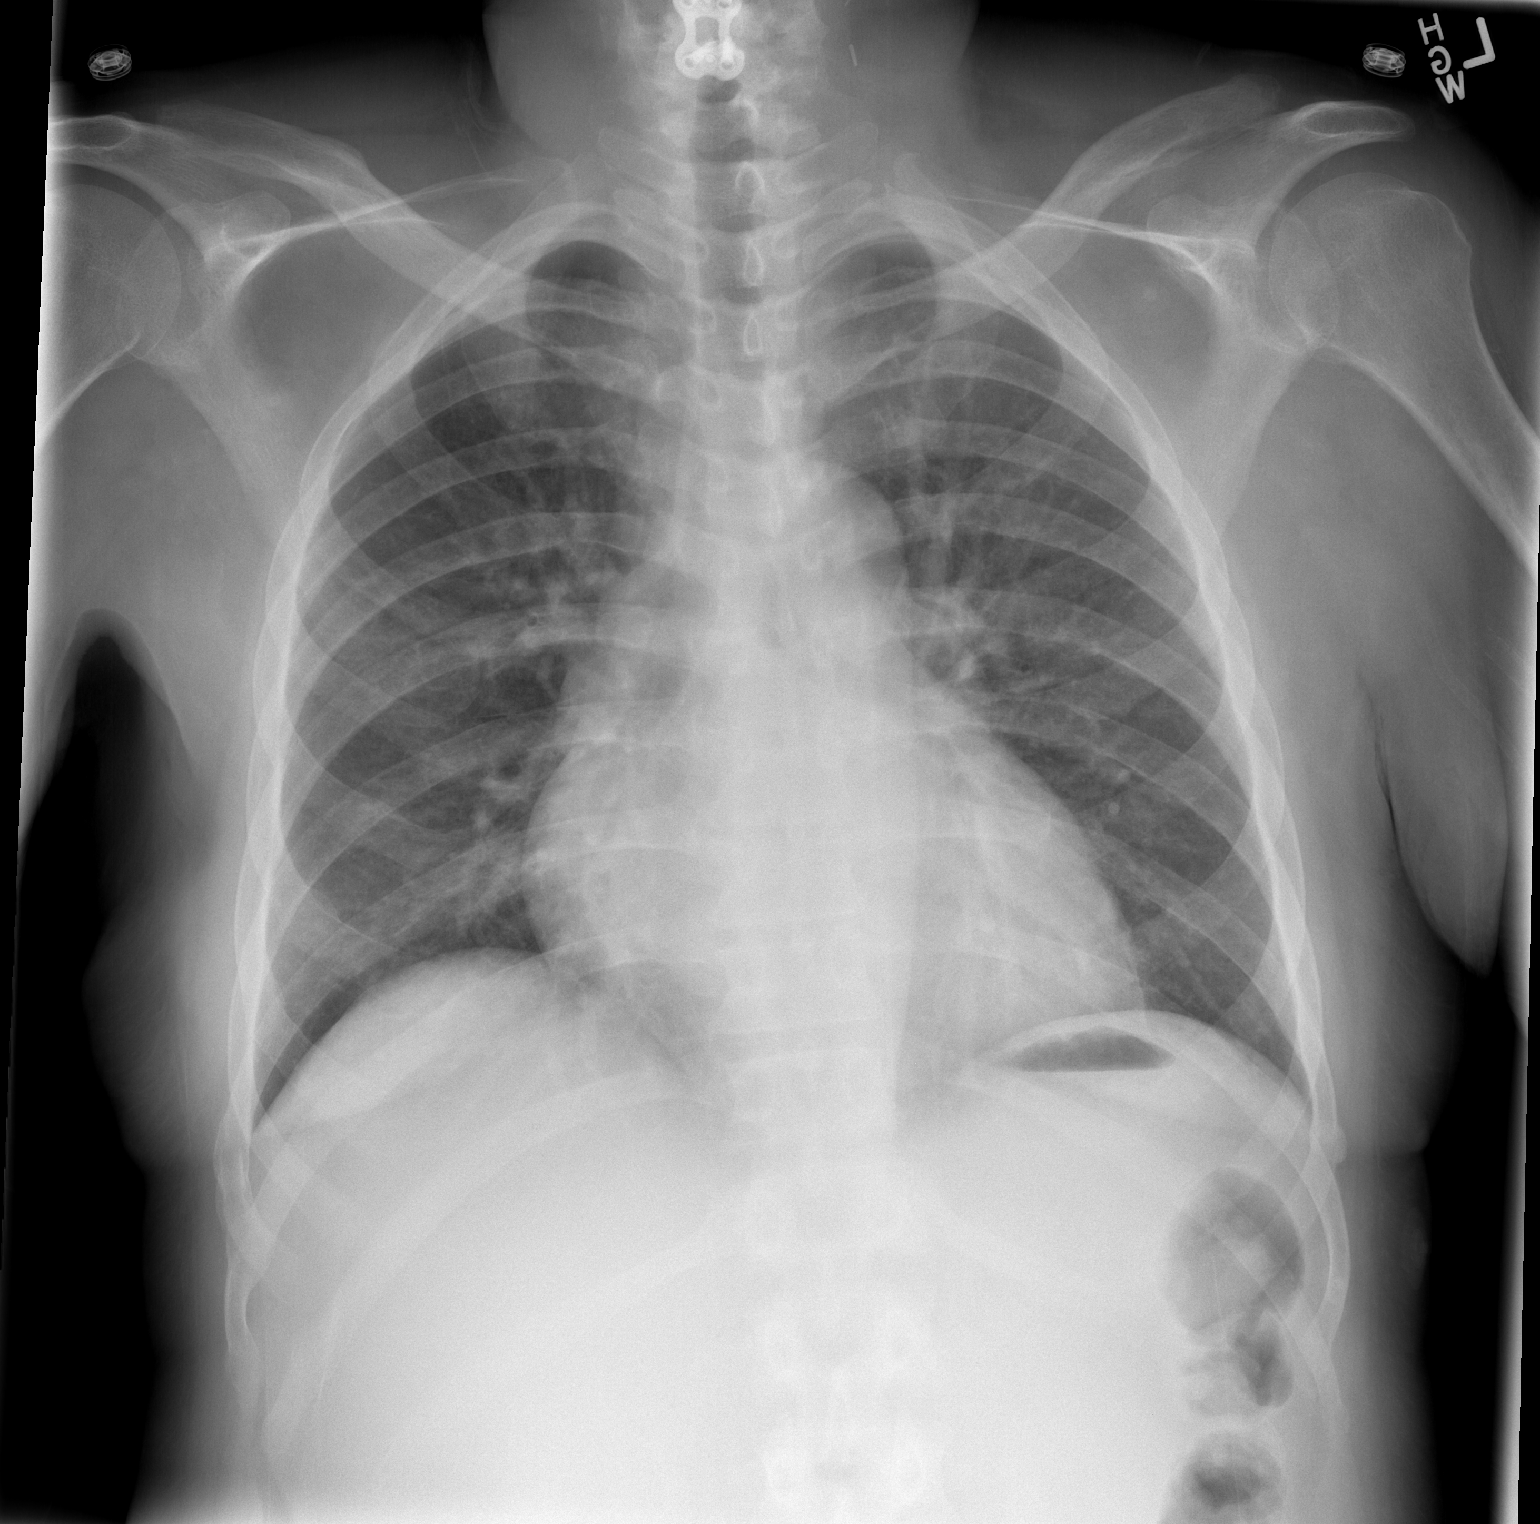

[w chest lat]
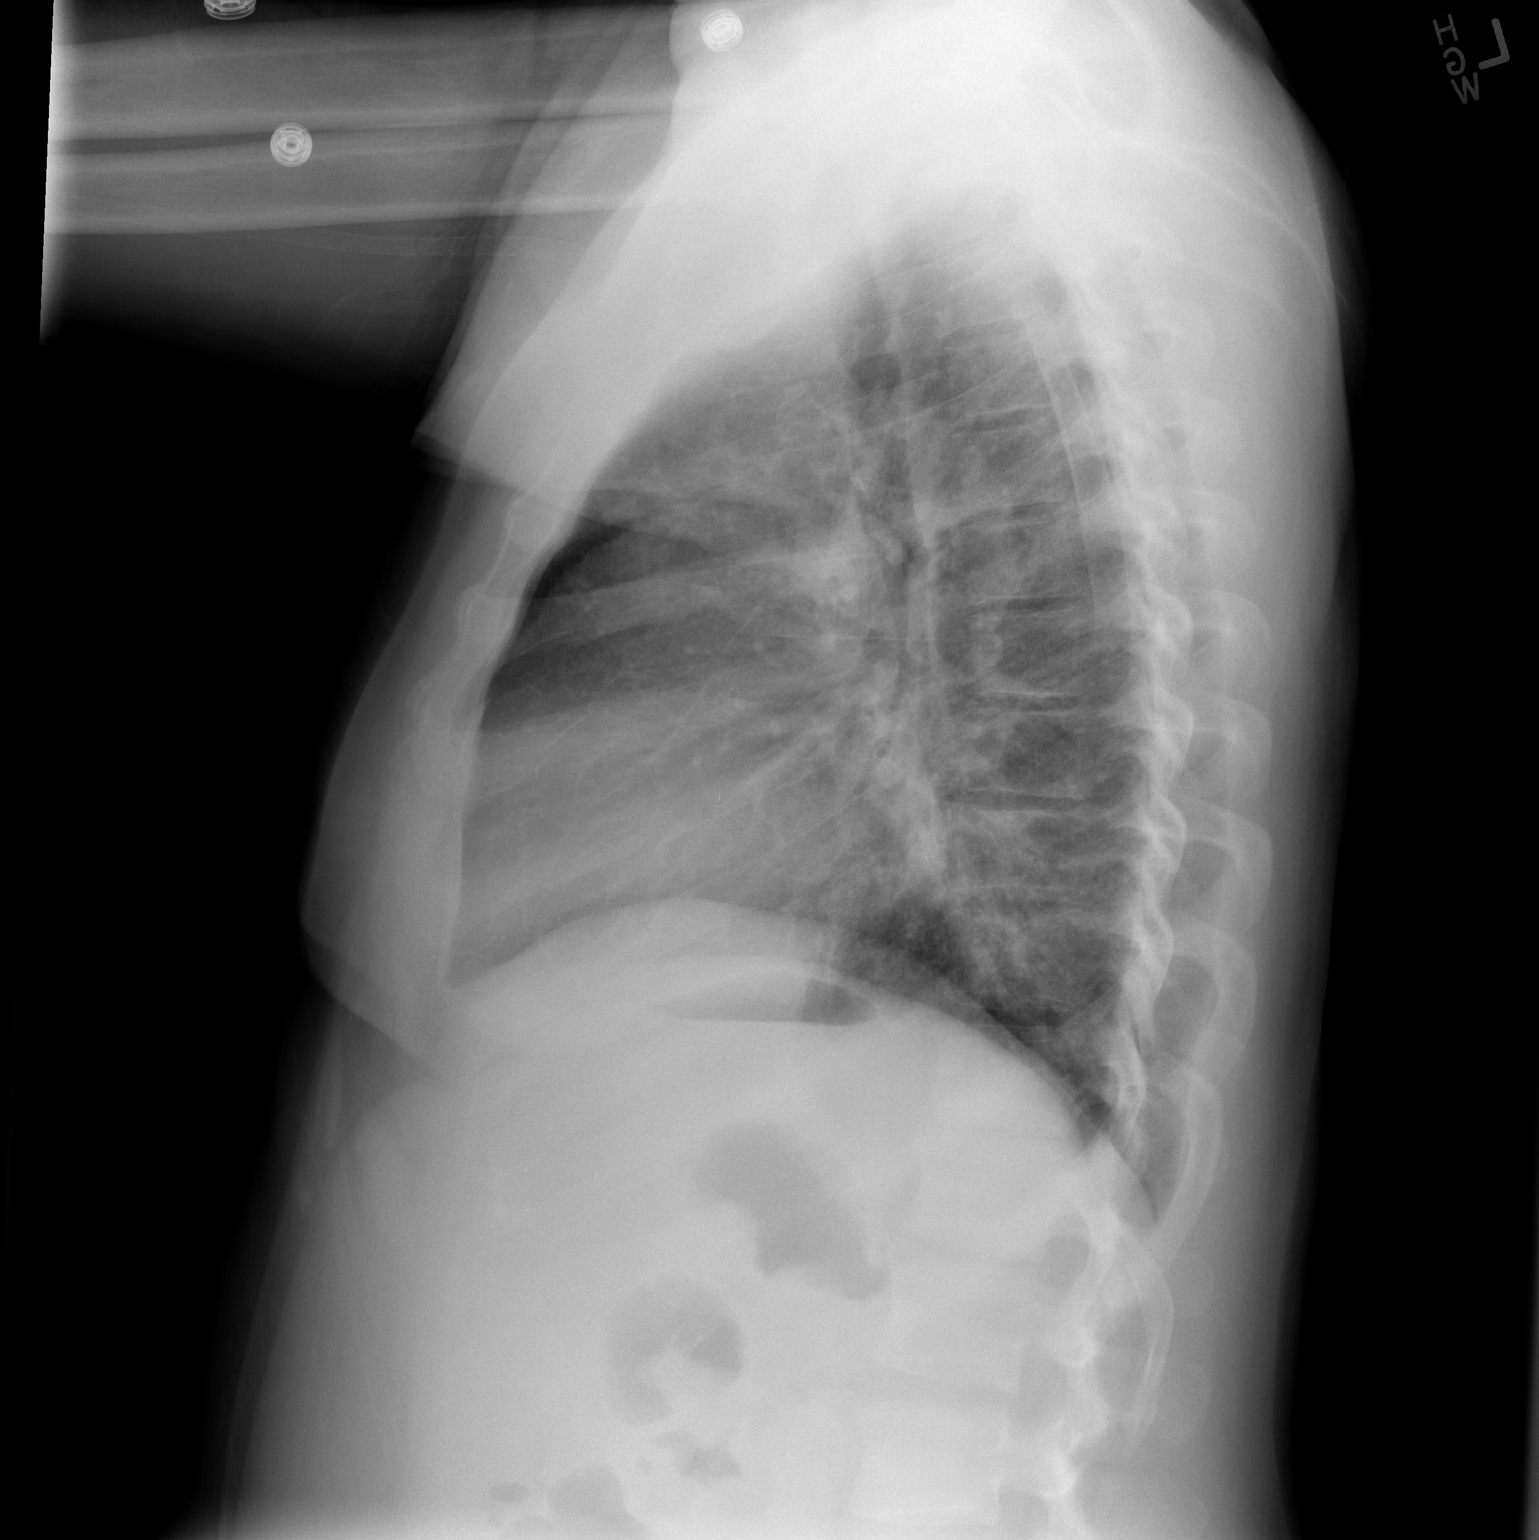

[2 of 2 positions shown; findings below may reference images not displayed]

FINDINGS: The heart is borderline enlarged but stable. There is mild
tortuosity of the thoracic aorta. Chronic vascular congestion
without overt pulmonary edema. No pleural effusions. No
pneumothorax. The bony thorax is intact. Remote healed rib fractures
are noted bilaterally.
IMPRESSION: Chronic vascular congestion but no overt pulmonary edema or pleural
effusion.

## 2016-12-18 IMAGING — DX DG CHEST 2V
2 series · 2 of 2 positions shown · non-contrast
Comparison: January 24, 2014.

CLINICAL DATA: Acute left lower rib pain after fall 4 days ago.
Initial encounter.

EXAM:
CHEST  2 VIEW

[chest pa]
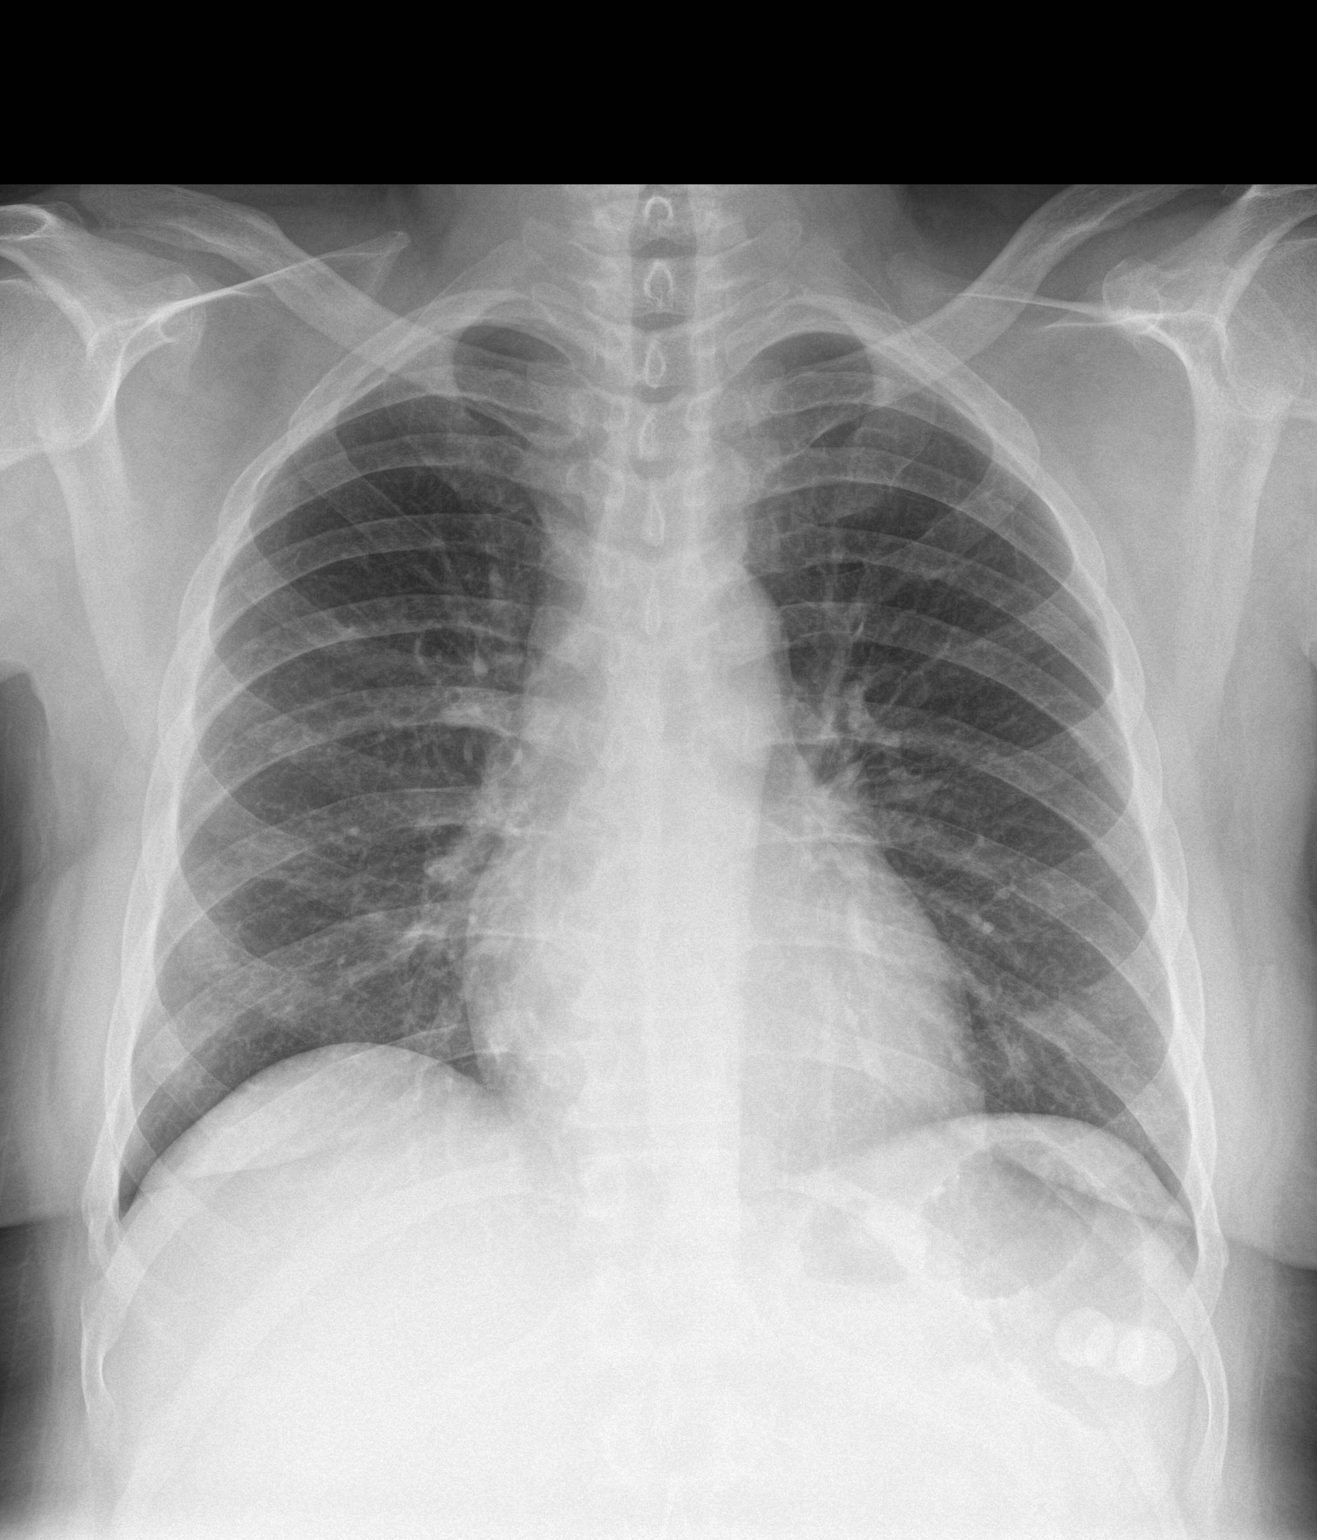

[chest lat]
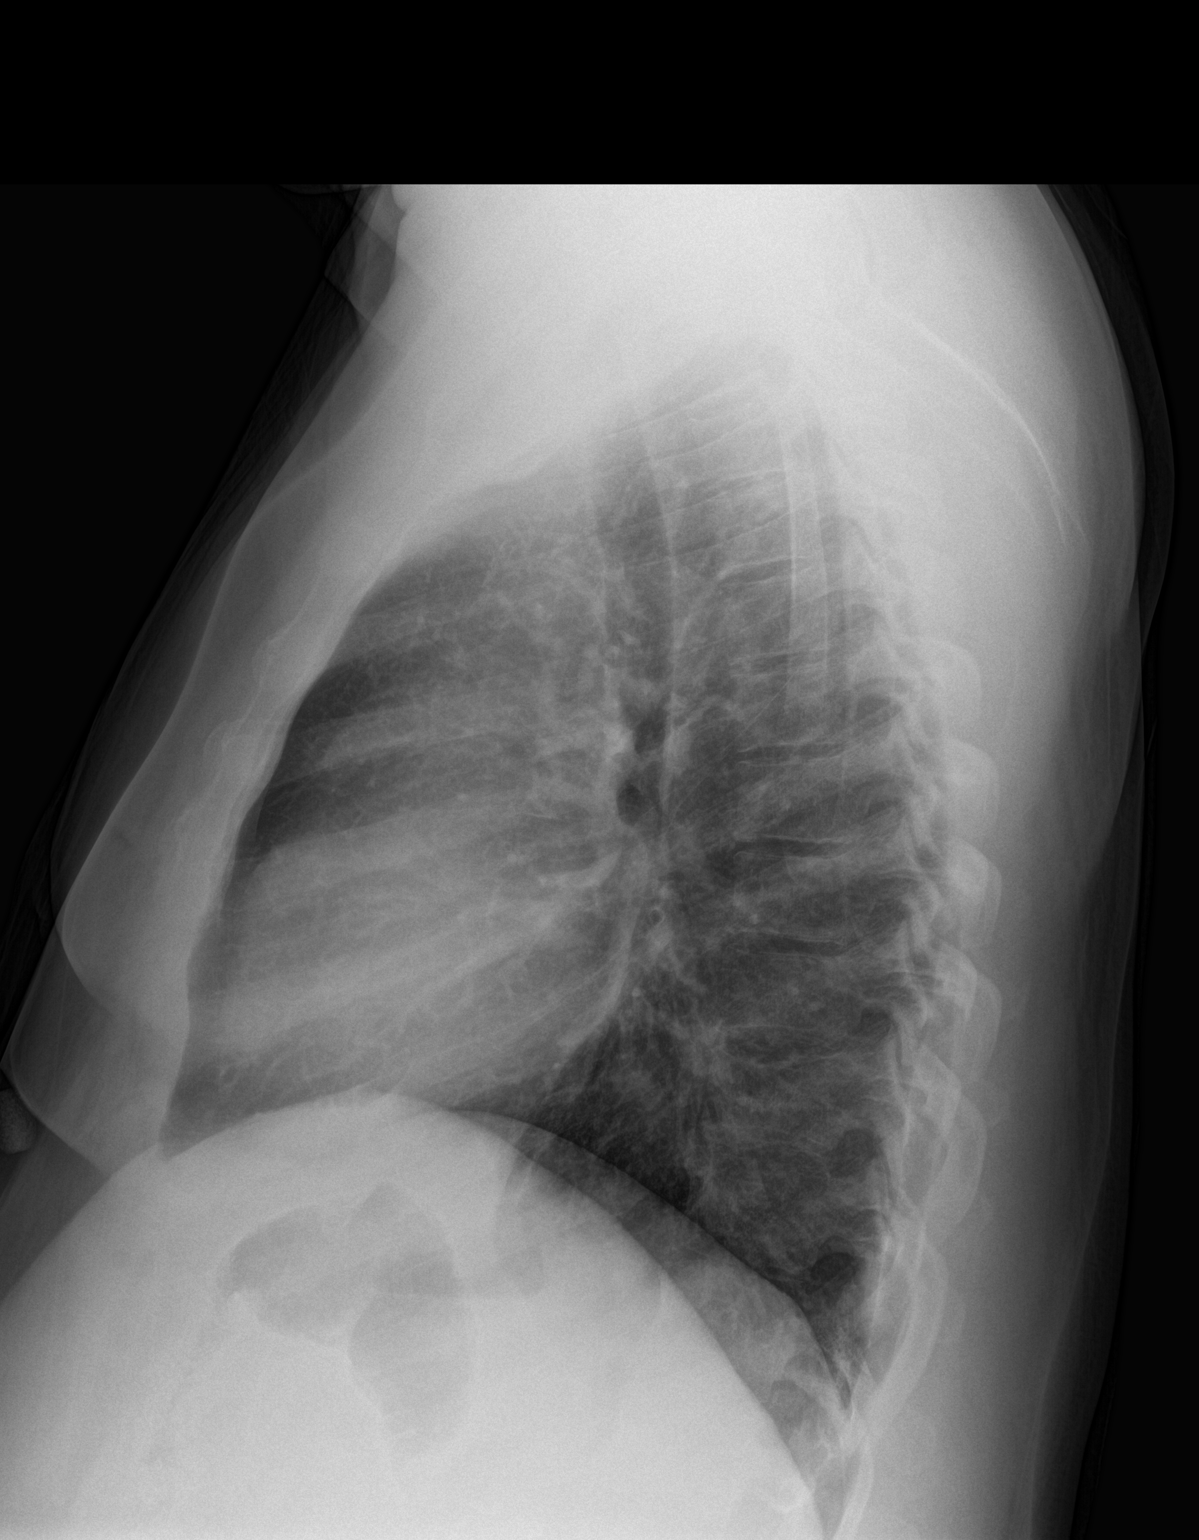

[2 of 2 positions shown; findings below may reference images not displayed]

FINDINGS: The heart size and mediastinal contours are within normal limits.
Both lungs are clear. No pneumothorax or pleural effusion is noted.
Old right lower rib fractures are noted.
IMPRESSION: No acute cardiopulmonary abnormality seen.

## 2017-12-30 IMAGING — DX DG KNEE 1-2V*L*
2 series · 2 of 2 positions shown · non-contrast
Comparison: Knee radiographs of 08/31/2011

CLINICAL DATA: Rest pain and edema. Left below-the-knee amputation
on 07/09/2015.

EXAM:
LEFT KNEE - 1-2 VIEW

[knee ap]
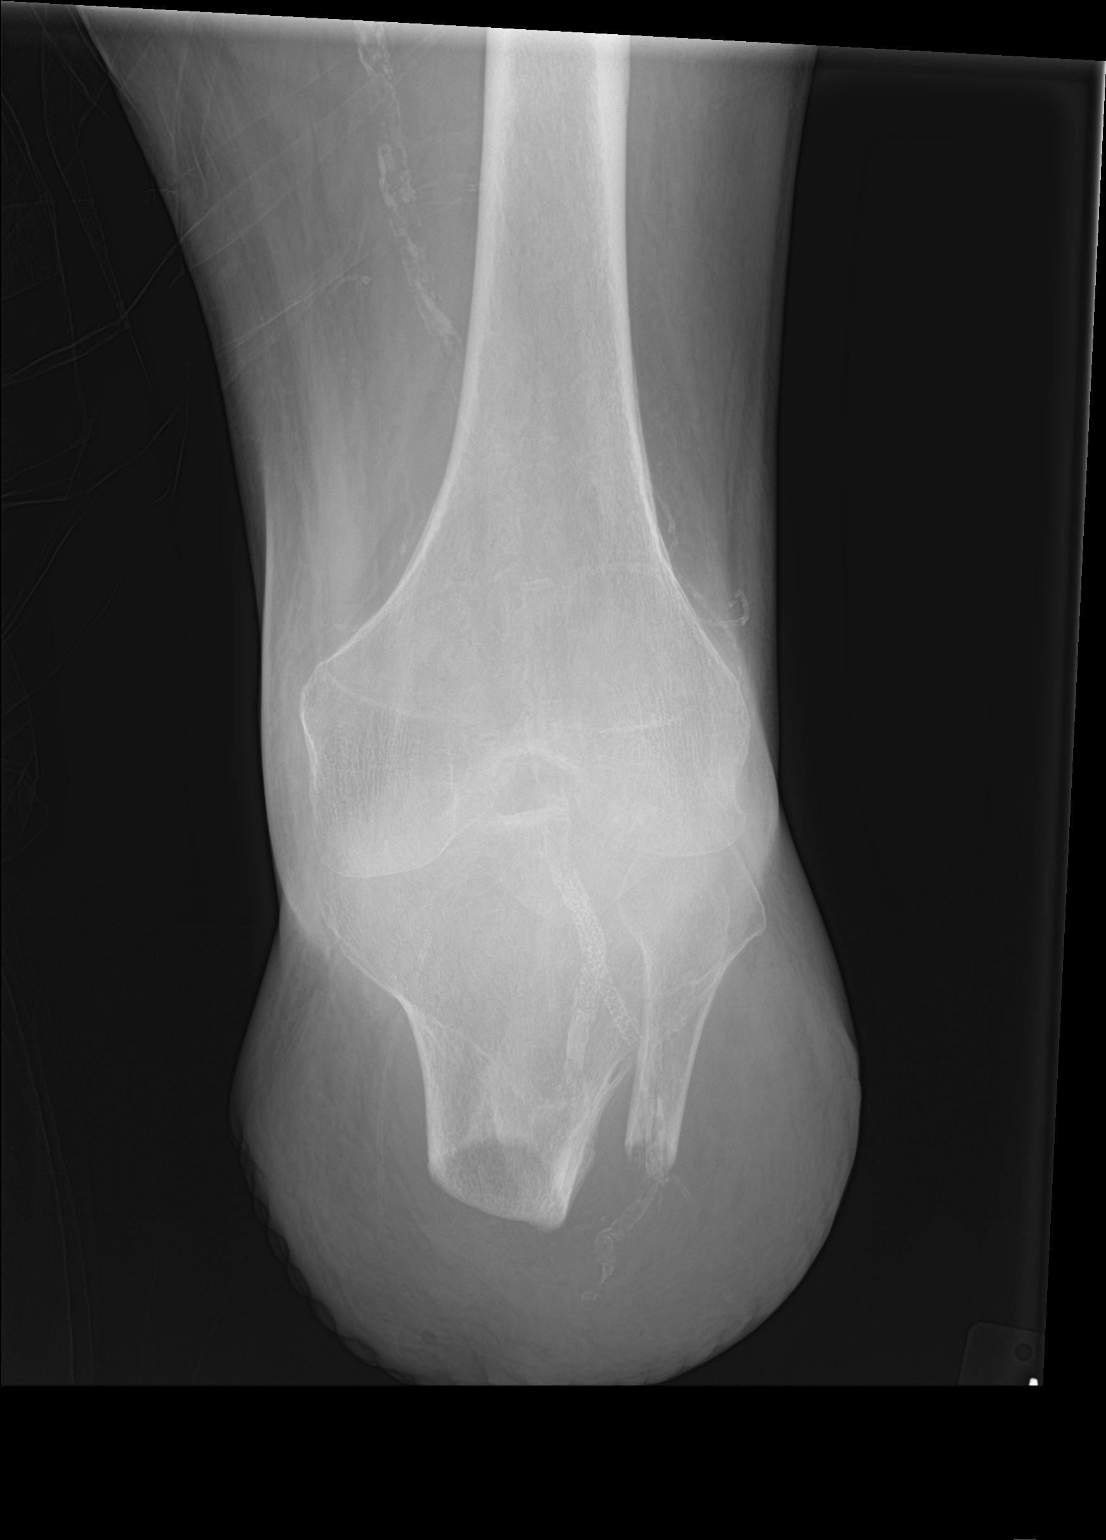

[knee lat]
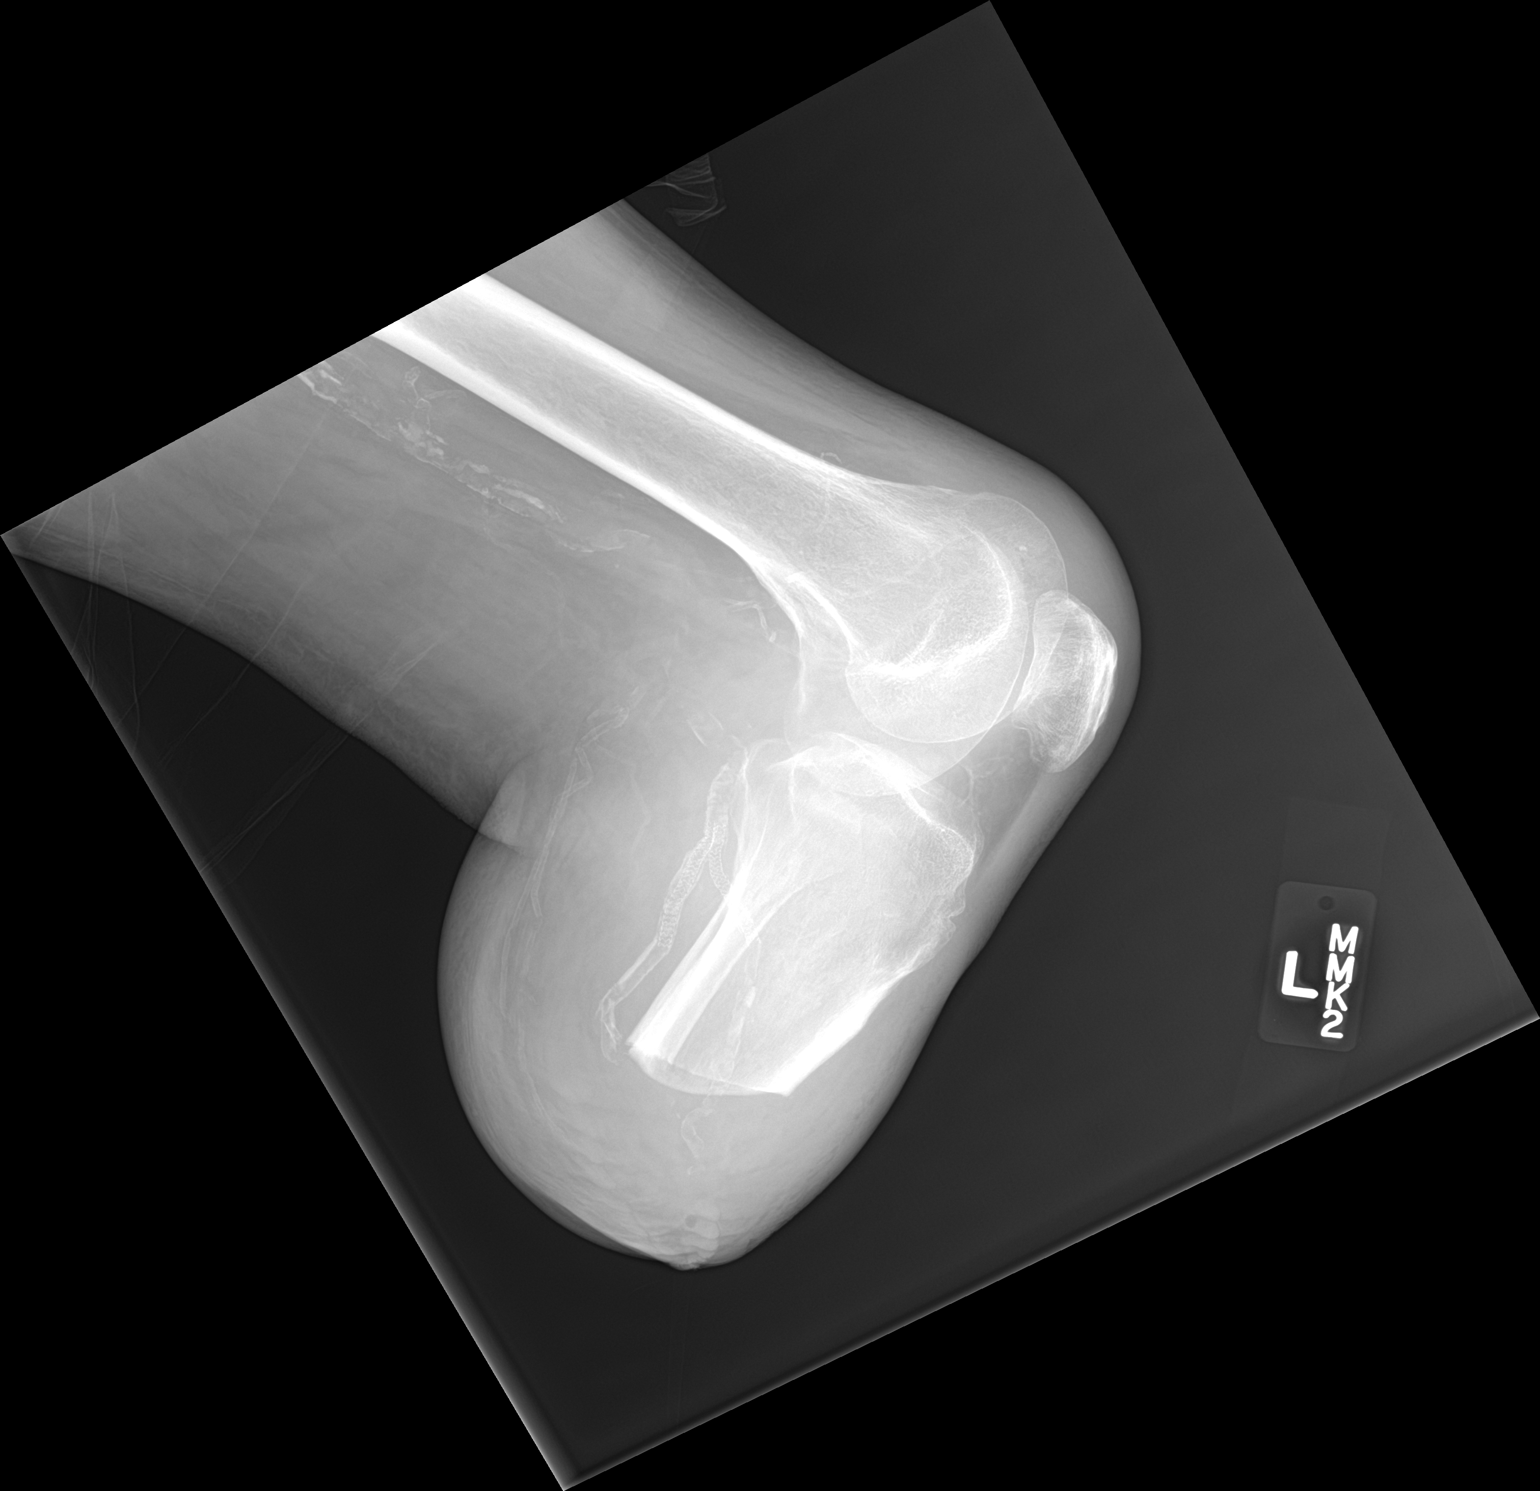

[2 of 2 positions shown; findings below may reference images not displayed]

FINDINGS: Extensive vascular calcifications. Below-the-knee amputation. Soft
tissue swelling about the residual peripheral lower extremity. No
soft tissue gas or unexpected radiopaque foreign object. No osseous
destruction.
IMPRESSION: Interval below the knee amputation, with nonspecific soft tissue
edema.
